# Patient Record
Sex: Male | Born: 1945
Health system: Southern US, Community
[De-identification: ages and names within clinical notes are randomized; demographics above are authoritative.]

## PROBLEM LIST (undated history)

## (undated) DIAGNOSIS — J189 Pneumonia, unspecified organism: Secondary | ICD-10-CM

## (undated) DIAGNOSIS — IMO0002 Reserved for concepts with insufficient information to code with codable children: Secondary | ICD-10-CM

## (undated) DIAGNOSIS — D696 Thrombocytopenia, unspecified: Secondary | ICD-10-CM

## (undated) DIAGNOSIS — N189 Chronic kidney disease, unspecified: Secondary | ICD-10-CM

## (undated) DIAGNOSIS — K219 Gastro-esophageal reflux disease without esophagitis: Secondary | ICD-10-CM

## (undated) DIAGNOSIS — K8020 Calculus of gallbladder without cholecystitis without obstruction: Secondary | ICD-10-CM

## (undated) DIAGNOSIS — Z992 Dependence on renal dialysis: Secondary | ICD-10-CM

## (undated) DIAGNOSIS — D649 Anemia, unspecified: Secondary | ICD-10-CM

## (undated) DIAGNOSIS — C9000 Multiple myeloma not having achieved remission: Secondary | ICD-10-CM

## (undated) DIAGNOSIS — M1990 Unspecified osteoarthritis, unspecified site: Secondary | ICD-10-CM

## (undated) DIAGNOSIS — R945 Abnormal results of liver function studies: Secondary | ICD-10-CM

## (undated) DIAGNOSIS — I959 Hypotension, unspecified: Secondary | ICD-10-CM

## (undated) HISTORY — DX: Calculus of gallbladder without cholecystitis without obstruction: K80.20

## (undated) HISTORY — DX: Multiple myeloma not having achieved remission: C90.00

## (undated) HISTORY — PX: MOUTH SURGERY: SHX715

## (undated) HISTORY — PX: RENAL BIOPSY: SHX156

## (undated) HISTORY — DX: Dependence on renal dialysis: Z99.2

---

## 2016-05-09 ENCOUNTER — Inpatient Hospital Stay (HOSPITAL_COMMUNITY)
Admission: EM | Admit: 2016-05-09 | Discharge: 2016-05-29 | DRG: 823 | Disposition: A | Discharge status: Rehabilitation Facility | Payer: PPO | Attending: Internal Medicine | Admitting: Internal Medicine

## 2016-05-09 ENCOUNTER — Encounter (HOSPITAL_COMMUNITY): Payer: Self-pay | Admitting: Emergency Medicine

## 2016-05-09 DIAGNOSIS — D619 Aplastic anemia, unspecified: Secondary | ICD-10-CM | POA: Diagnosis present

## 2016-05-09 DIAGNOSIS — I9581 Postprocedural hypotension: Secondary | ICD-10-CM | POA: Diagnosis not present

## 2016-05-09 DIAGNOSIS — N2581 Secondary hyperparathyroidism of renal origin: Secondary | ICD-10-CM | POA: Diagnosis present

## 2016-05-09 DIAGNOSIS — M545 Low back pain, unspecified: Secondary | ICD-10-CM | POA: Diagnosis present

## 2016-05-09 DIAGNOSIS — Z419 Encounter for procedure for purposes other than remedying health state, unspecified: Secondary | ICD-10-CM | POA: Diagnosis present

## 2016-05-09 DIAGNOSIS — E875 Hyperkalemia: Secondary | ICD-10-CM | POA: Diagnosis present

## 2016-05-09 DIAGNOSIS — I10 Essential (primary) hypertension: Secondary | ICD-10-CM | POA: Diagnosis present

## 2016-05-09 DIAGNOSIS — E538 Deficiency of other specified B group vitamins: Secondary | ICD-10-CM | POA: Diagnosis present

## 2016-05-09 DIAGNOSIS — E876 Hypokalemia: Secondary | ICD-10-CM | POA: Diagnosis not present

## 2016-05-09 DIAGNOSIS — R195 Other fecal abnormalities: Secondary | ICD-10-CM | POA: Diagnosis present

## 2016-05-09 DIAGNOSIS — N179 Acute kidney failure, unspecified: Secondary | ICD-10-CM | POA: Diagnosis present

## 2016-05-09 DIAGNOSIS — C9000 Multiple myeloma not having achieved remission: Principal | ICD-10-CM | POA: Diagnosis present

## 2016-05-09 DIAGNOSIS — I770 Arteriovenous fistula, acquired: Secondary | ICD-10-CM

## 2016-05-09 DIAGNOSIS — Z87891 Personal history of nicotine dependence: Secondary | ICD-10-CM

## 2016-05-09 DIAGNOSIS — N186 End stage renal disease: Secondary | ICD-10-CM | POA: Diagnosis present

## 2016-05-09 DIAGNOSIS — F22 Delusional disorders: Secondary | ICD-10-CM

## 2016-05-09 DIAGNOSIS — R509 Fever, unspecified: Secondary | ICD-10-CM

## 2016-05-09 DIAGNOSIS — Z6821 Body mass index (BMI) 21.0-21.9, adult: Secondary | ICD-10-CM

## 2016-05-09 DIAGNOSIS — D62 Acute posthemorrhagic anemia: Secondary | ICD-10-CM | POA: Diagnosis present

## 2016-05-09 DIAGNOSIS — E559 Vitamin D deficiency, unspecified: Secondary | ICD-10-CM | POA: Diagnosis present

## 2016-05-09 DIAGNOSIS — R21 Rash and other nonspecific skin eruption: Secondary | ICD-10-CM | POA: Diagnosis not present

## 2016-05-09 DIAGNOSIS — T451X5A Adverse effect of antineoplastic and immunosuppressive drugs, initial encounter: Secondary | ICD-10-CM | POA: Diagnosis present

## 2016-05-09 DIAGNOSIS — J810 Acute pulmonary edema: Secondary | ICD-10-CM | POA: Diagnosis not present

## 2016-05-09 DIAGNOSIS — G8918 Other acute postprocedural pain: Secondary | ICD-10-CM | POA: Diagnosis present

## 2016-05-09 DIAGNOSIS — E860 Dehydration: Secondary | ICD-10-CM | POA: Diagnosis present

## 2016-05-09 DIAGNOSIS — I132 Hypertensive heart and chronic kidney disease with heart failure and with stage 5 chronic kidney disease, or end stage renal disease: Secondary | ICD-10-CM | POA: Diagnosis present

## 2016-05-09 DIAGNOSIS — N2589 Other disorders resulting from impaired renal tubular function: Secondary | ICD-10-CM | POA: Diagnosis present

## 2016-05-09 DIAGNOSIS — T148XXA Other injury of unspecified body region, initial encounter: Secondary | ICD-10-CM

## 2016-05-09 DIAGNOSIS — Z992 Dependence on renal dialysis: Secondary | ICD-10-CM | POA: Diagnosis present

## 2016-05-09 DIAGNOSIS — I509 Heart failure, unspecified: Secondary | ICD-10-CM

## 2016-05-09 DIAGNOSIS — N19 Unspecified kidney failure: Secondary | ICD-10-CM

## 2016-05-09 DIAGNOSIS — F419 Anxiety disorder, unspecified: Secondary | ICD-10-CM | POA: Diagnosis present

## 2016-05-09 DIAGNOSIS — M84552A Pathological fracture in neoplastic disease, left femur, initial encounter for fracture: Secondary | ICD-10-CM | POA: Diagnosis present

## 2016-05-09 DIAGNOSIS — R05 Cough: Secondary | ICD-10-CM | POA: Diagnosis not present

## 2016-05-09 DIAGNOSIS — R059 Cough, unspecified: Secondary | ICD-10-CM

## 2016-05-09 DIAGNOSIS — Z880 Allergy status to penicillin: Secondary | ICD-10-CM

## 2016-05-09 DIAGNOSIS — K5909 Other constipation: Secondary | ICD-10-CM

## 2016-05-09 DIAGNOSIS — N08 Glomerular disorders in diseases classified elsewhere: Secondary | ICD-10-CM | POA: Insufficient documentation

## 2016-05-09 DIAGNOSIS — E440 Moderate protein-calorie malnutrition: Secondary | ICD-10-CM | POA: Diagnosis present

## 2016-05-09 DIAGNOSIS — D61810 Antineoplastic chemotherapy induced pancytopenia: Secondary | ICD-10-CM | POA: Diagnosis present

## 2016-05-09 DIAGNOSIS — K5900 Constipation, unspecified: Secondary | ICD-10-CM | POA: Diagnosis not present

## 2016-05-09 DIAGNOSIS — N390 Urinary tract infection, site not specified: Secondary | ICD-10-CM | POA: Diagnosis present

## 2016-05-09 HISTORY — DX: Reserved for concepts with insufficient information to code with codable children: IMO0002

## 2016-05-09 LAB — CBC WITH DIFFERENTIAL/PLATELET
Basophils Absolute: 0 10*3/uL (ref 0.0–0.1)
Basophils Relative: 0 %
EOS PCT: 1 %
Eosinophils Absolute: 0.1 10*3/uL (ref 0.0–0.7)
HEMATOCRIT: 26.5 % — AB (ref 39.0–52.0)
Hemoglobin: 9.2 g/dL — ABNORMAL LOW (ref 13.0–17.0)
LYMPHS ABS: 1.1 10*3/uL (ref 0.7–4.0)
Lymphocytes Relative: 17 %
MCH: 29.8 pg (ref 26.0–34.0)
MCHC: 34.7 g/dL (ref 30.0–36.0)
MCV: 85.8 fL (ref 78.0–100.0)
MONO ABS: 0.6 10*3/uL (ref 0.1–1.0)
MONOS PCT: 10 %
NEUTROS ABS: 4.4 10*3/uL (ref 1.7–7.7)
Neutrophils Relative %: 72 %
PLATELETS: 105 10*3/uL — AB (ref 150–400)
RBC: 3.09 MIL/uL — ABNORMAL LOW (ref 4.22–5.81)
RDW: 12.3 % (ref 11.5–15.5)
WBC: 6.2 10*3/uL (ref 4.0–10.5)

## 2016-05-09 NOTE — ED Notes (Signed)
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

## 2016-05-10 ENCOUNTER — Encounter (HOSPITAL_COMMUNITY): Payer: Self-pay | Admitting: *Deleted

## 2016-05-10 ENCOUNTER — Emergency Department: Payer: PPO

## 2016-05-10 ENCOUNTER — Inpatient Hospital Stay (HOSPITAL_COMMUNITY): Payer: PPO

## 2016-05-10 ENCOUNTER — Emergency Department (HOSPITAL_COMMUNITY): Payer: PPO

## 2016-05-10 DIAGNOSIS — R195 Other fecal abnormalities: Secondary | ICD-10-CM | POA: Diagnosis not present

## 2016-05-10 DIAGNOSIS — N2589 Other disorders resulting from impaired renal tubular function: Secondary | ICD-10-CM | POA: Diagnosis present

## 2016-05-10 DIAGNOSIS — C888 Other malignant immunoproliferative diseases: Secondary | ICD-10-CM | POA: Diagnosis not present

## 2016-05-10 DIAGNOSIS — N178 Other acute kidney failure: Secondary | ICD-10-CM | POA: Diagnosis not present

## 2016-05-10 DIAGNOSIS — Z992 Dependence on renal dialysis: Secondary | ICD-10-CM | POA: Diagnosis not present

## 2016-05-10 DIAGNOSIS — M899 Disorder of bone, unspecified: Secondary | ICD-10-CM | POA: Diagnosis not present

## 2016-05-10 DIAGNOSIS — S72302S Unspecified fracture of shaft of left femur, sequela: Secondary | ICD-10-CM | POA: Diagnosis not present

## 2016-05-10 DIAGNOSIS — N184 Chronic kidney disease, stage 4 (severe): Secondary | ICD-10-CM | POA: Diagnosis not present

## 2016-05-10 DIAGNOSIS — E876 Hypokalemia: Secondary | ICD-10-CM | POA: Diagnosis not present

## 2016-05-10 DIAGNOSIS — Z880 Allergy status to penicillin: Secondary | ICD-10-CM | POA: Diagnosis not present

## 2016-05-10 DIAGNOSIS — S72302D Unspecified fracture of shaft of left femur, subsequent encounter for closed fracture with routine healing: Secondary | ICD-10-CM | POA: Diagnosis not present

## 2016-05-10 DIAGNOSIS — F419 Anxiety disorder, unspecified: Secondary | ICD-10-CM | POA: Diagnosis present

## 2016-05-10 DIAGNOSIS — I132 Hypertensive heart and chronic kidney disease with heart failure and with stage 5 chronic kidney disease, or end stage renal disease: Secondary | ICD-10-CM | POA: Diagnosis present

## 2016-05-10 DIAGNOSIS — K5900 Constipation, unspecified: Secondary | ICD-10-CM | POA: Diagnosis not present

## 2016-05-10 DIAGNOSIS — Z6821 Body mass index (BMI) 21.0-21.9, adult: Secondary | ICD-10-CM | POA: Diagnosis not present

## 2016-05-10 DIAGNOSIS — N179 Acute kidney failure, unspecified: Secondary | ICD-10-CM | POA: Diagnosis present

## 2016-05-10 DIAGNOSIS — R05 Cough: Secondary | ICD-10-CM | POA: Diagnosis not present

## 2016-05-10 DIAGNOSIS — N2581 Secondary hyperparathyroidism of renal origin: Secondary | ICD-10-CM | POA: Diagnosis present

## 2016-05-10 DIAGNOSIS — E559 Vitamin D deficiency, unspecified: Secondary | ICD-10-CM | POA: Diagnosis present

## 2016-05-10 DIAGNOSIS — D638 Anemia in other chronic diseases classified elsewhere: Secondary | ICD-10-CM | POA: Diagnosis not present

## 2016-05-10 DIAGNOSIS — N390 Urinary tract infection, site not specified: Secondary | ICD-10-CM | POA: Diagnosis present

## 2016-05-10 DIAGNOSIS — D696 Thrombocytopenia, unspecified: Secondary | ICD-10-CM | POA: Diagnosis not present

## 2016-05-10 DIAGNOSIS — J810 Acute pulmonary edema: Secondary | ICD-10-CM | POA: Diagnosis not present

## 2016-05-10 DIAGNOSIS — D892 Hypergammaglobulinemia, unspecified: Secondary | ICD-10-CM | POA: Diagnosis not present

## 2016-05-10 DIAGNOSIS — N186 End stage renal disease: Secondary | ICD-10-CM | POA: Diagnosis present

## 2016-05-10 DIAGNOSIS — M549 Dorsalgia, unspecified: Secondary | ICD-10-CM | POA: Diagnosis not present

## 2016-05-10 DIAGNOSIS — Z510 Encounter for antineoplastic radiation therapy: Secondary | ICD-10-CM | POA: Diagnosis present

## 2016-05-10 DIAGNOSIS — Z87891 Personal history of nicotine dependence: Secondary | ICD-10-CM | POA: Diagnosis not present

## 2016-05-10 DIAGNOSIS — D61810 Antineoplastic chemotherapy induced pancytopenia: Secondary | ICD-10-CM | POA: Diagnosis present

## 2016-05-10 DIAGNOSIS — E440 Moderate protein-calorie malnutrition: Secondary | ICD-10-CM | POA: Diagnosis present

## 2016-05-10 DIAGNOSIS — D62 Acute posthemorrhagic anemia: Secondary | ICD-10-CM | POA: Diagnosis not present

## 2016-05-10 DIAGNOSIS — I770 Arteriovenous fistula, acquired: Secondary | ICD-10-CM | POA: Diagnosis not present

## 2016-05-10 DIAGNOSIS — D61818 Other pancytopenia: Secondary | ICD-10-CM | POA: Diagnosis not present

## 2016-05-10 DIAGNOSIS — R5381 Other malaise: Secondary | ICD-10-CM | POA: Diagnosis not present

## 2016-05-10 DIAGNOSIS — C9000 Multiple myeloma not having achieved remission: Secondary | ICD-10-CM | POA: Diagnosis present

## 2016-05-10 DIAGNOSIS — N189 Chronic kidney disease, unspecified: Secondary | ICD-10-CM | POA: Diagnosis not present

## 2016-05-10 DIAGNOSIS — I9581 Postprocedural hypotension: Secondary | ICD-10-CM | POA: Diagnosis not present

## 2016-05-10 DIAGNOSIS — D519 Vitamin B12 deficiency anemia, unspecified: Secondary | ICD-10-CM | POA: Diagnosis not present

## 2016-05-10 DIAGNOSIS — D619 Aplastic anemia, unspecified: Secondary | ICD-10-CM | POA: Diagnosis not present

## 2016-05-10 DIAGNOSIS — E875 Hyperkalemia: Secondary | ICD-10-CM | POA: Diagnosis present

## 2016-05-10 DIAGNOSIS — E538 Deficiency of other specified B group vitamins: Secondary | ICD-10-CM | POA: Diagnosis present

## 2016-05-10 DIAGNOSIS — I509 Heart failure, unspecified: Secondary | ICD-10-CM | POA: Diagnosis not present

## 2016-05-10 DIAGNOSIS — R21 Rash and other nonspecific skin eruption: Secondary | ICD-10-CM | POA: Diagnosis not present

## 2016-05-10 DIAGNOSIS — M84552A Pathological fracture in neoplastic disease, left femur, initial encounter for fracture: Secondary | ICD-10-CM | POA: Diagnosis not present

## 2016-05-10 DIAGNOSIS — E860 Dehydration: Secondary | ICD-10-CM | POA: Diagnosis present

## 2016-05-10 DIAGNOSIS — D6189 Other specified aplastic anemias and other bone marrow failure syndromes: Secondary | ICD-10-CM | POA: Diagnosis not present

## 2016-05-10 DIAGNOSIS — N19 Unspecified kidney failure: Secondary | ICD-10-CM | POA: Diagnosis not present

## 2016-05-10 DIAGNOSIS — M545 Low back pain: Secondary | ICD-10-CM | POA: Diagnosis not present

## 2016-05-10 DIAGNOSIS — F4323 Adjustment disorder with mixed anxiety and depressed mood: Secondary | ICD-10-CM | POA: Diagnosis not present

## 2016-05-10 DIAGNOSIS — M84552D Pathological fracture in neoplastic disease, left femur, subsequent encounter for fracture with routine healing: Secondary | ICD-10-CM | POA: Diagnosis not present

## 2016-05-10 LAB — URINALYSIS W MICROSCOPIC (NOT AT ARMC)
BILIRUBIN URINE: NEGATIVE
GLUCOSE, UA: 100 mg/dL — AB
KETONES UR: NEGATIVE mg/dL
Nitrite: NEGATIVE
PROTEIN: 100 mg/dL — AB
Specific Gravity, Urine: 1.017 (ref 1.005–1.030)
pH: 6 (ref 5.0–8.0)

## 2016-05-10 LAB — HEPATIC FUNCTION PANEL
ALBUMIN: 2.8 g/dL — AB (ref 3.5–5.0)
ALT: 9 U/L — AB (ref 17–63)
AST: 13 U/L — AB (ref 15–41)
Alkaline Phosphatase: 72 U/L (ref 38–126)
BILIRUBIN DIRECT: 0.1 mg/dL (ref 0.1–0.5)
Indirect Bilirubin: 0.3 mg/dL (ref 0.3–0.9)
Total Bilirubin: 0.4 mg/dL (ref 0.3–1.2)
Total Protein: 9.7 g/dL — ABNORMAL HIGH (ref 6.5–8.1)

## 2016-05-10 LAB — BASIC METABOLIC PANEL
ANION GAP: 13 (ref 5–15)
BUN: 43 mg/dL — ABNORMAL HIGH (ref 6–20)
CO2: 18 mmol/L — AB (ref 22–32)
Calcium: 8.7 mg/dL — ABNORMAL LOW (ref 8.9–10.3)
Chloride: 102 mmol/L (ref 101–111)
Creatinine, Ser: 10.68 mg/dL — ABNORMAL HIGH (ref 0.61–1.24)
GFR, EST AFRICAN AMERICAN: 5 mL/min — AB (ref >60)
GFR, EST NON AFRICAN AMERICAN: 4 mL/min — AB (ref >60)
GLUCOSE: 120 mg/dL — AB (ref 65–99)
POTASSIUM: 5.2 mmol/L — AB (ref 3.5–5.1)
Sodium: 133 mmol/L — ABNORMAL LOW (ref 135–145)

## 2016-05-10 LAB — IRON AND TIBC
Iron: 64 ug/dL (ref 45–182)
SATURATION RATIOS: 38 % (ref 17.9–39.5)
TIBC: 167 ug/dL — ABNORMAL LOW (ref 250–450)
UIBC: 103 ug/dL

## 2016-05-10 LAB — PLATELET FUNCTION ASSAY
Collagen / ADP: 136 seconds — ABNORMAL HIGH (ref 0–118)
Collagen / Epinephrine: >275 seconds — ABNORMAL HIGH (ref 0–193)

## 2016-05-10 LAB — I-STAT CHEM 8, ED
BUN: 61 mg/dL — AB (ref 6–20)
CALCIUM ION: 1.05 mmol/L — AB (ref 1.13–1.30)
CHLORIDE: 106 mmol/L (ref 101–111)
Creatinine, Ser: 10.5 mg/dL — ABNORMAL HIGH (ref 0.61–1.24)
GLUCOSE: 157 mg/dL — AB (ref 65–99)
HCT: 28 % — ABNORMAL LOW (ref 39.0–52.0)
Hemoglobin: 9.5 g/dL — ABNORMAL LOW (ref 13.0–17.0)
Potassium: 6.7 mmol/L (ref 3.5–5.1)
Sodium: 135 mmol/L (ref 135–145)
TCO2: 22 mmol/L (ref 0–100)

## 2016-05-10 LAB — NA AND K (SODIUM & POTASSIUM), RAND UR
POTASSIUM UR: 30 mmol/L
SODIUM UR: 74 mmol/L

## 2016-05-10 LAB — SALICYLATE LEVEL: Salicylate Lvl: <4 mg/dL (ref 2.8–30.0)

## 2016-05-10 LAB — VITAMIN B12: Vitamin B-12: 106 pg/mL — ABNORMAL LOW (ref 180–914)

## 2016-05-10 LAB — POC OCCULT BLOOD, ED: Fecal Occult Bld: POSITIVE — AB

## 2016-05-10 LAB — ACETAMINOPHEN LEVEL: Acetaminophen (Tylenol), Serum: <10 ug/mL — ABNORMAL LOW (ref 10–30)

## 2016-05-10 LAB — PROTIME-INR
INR: 1.4 (ref 0.00–1.49)
Prothrombin Time: 17.3 seconds — ABNORMAL HIGH (ref 11.6–15.2)

## 2016-05-10 LAB — CREATININE, URINE, RANDOM: CREATININE, URINE: 114.42 mg/dL

## 2016-05-10 LAB — MAGNESIUM: MAGNESIUM: 1.9 mg/dL (ref 1.7–2.4)

## 2016-05-10 LAB — APTT: APTT: 33 s (ref 24–37)

## 2016-05-10 LAB — URIC ACID: URIC ACID, SERUM: 10.6 mg/dL — AB (ref 4.4–7.6)

## 2016-05-10 MED ORDER — VITAMIN K1 10 MG/ML IJ SOLN
5.0000 mg | Freq: Once | INTRAMUSCULAR | Status: AC
  Administered 2016-05-10: 5 mg via SUBCUTANEOUS
  Filled 2016-05-10: qty 0.5

## 2016-05-10 MED ORDER — ONDANSETRON HCL 4 MG/2ML IJ SOLN: 4.0000 mg | Freq: Four times a day (QID) | INTRAMUSCULAR | Status: DC | PRN

## 2016-05-10 MED ORDER — NEPRO/CARBSTEADY PO LIQD
237.0000 mL | Freq: Two times a day (BID) | ORAL | Status: DC
Start: 2016-05-10 — End: 2016-05-26
  Administered 2016-05-10 – 2016-05-24 (×10): 237 mL via ORAL

## 2016-05-10 MED ORDER — HEPARIN SODIUM (PORCINE) 5000 UNIT/ML IJ SOLN: 5000.0000 [IU] | Freq: Three times a day (TID) | INTRAMUSCULAR | Status: DC

## 2016-05-10 MED ORDER — VITAMIN B-12 1000 MCG PO TABS
1000.0000 ug | ORAL_TABLET | Freq: Every day | ORAL | Status: DC
  Administered 2016-05-10 – 2016-05-29 (×18): 1000 ug via ORAL
  Filled 2016-05-10 (×20): qty 1

## 2016-05-10 MED ORDER — SODIUM CHLORIDE 0.9 % IV BOLUS (SEPSIS)
1000.0000 mL | Freq: Once | INTRAVENOUS | Status: AC
  Administered 2016-05-10: 1000 mL via INTRAVENOUS

## 2016-05-10 NOTE — ED Provider Notes (Signed)
CSN: [id number]     Arrival date & time 05/09/16  2057 History   First MD Initiated Contact with Patient 05/10/16 0302     Chief Complaint  Patient presents with  . low hgb      (Consider location/radiation/quality/duration/timing/severity/associated sxs/prior Treatment) HPI Comments: Patient states that he and family ate out Sunday and all have had nausea and vomiting since.  He went to an urgent care today due to the nausea.  They did blood work which showed that he had an H&H of 9.8 and 29.3 and was advised to come to the emergency department.  He reports that he's had persistent vomiting in the morning for the past several months, worse over the last 2 days clear in nature.  He noticed that his skin has become excessively dry.   The history is provided by the patient.    Past Medical History  Diagnosis Date  . Compression fracture    Past Surgical History  Procedure Laterality Date  . Mouth surgery     Family History  Problem Relation Age of Onset  . Diabetes Father    Social History  Substance Use Topics  . Smoking status: Former Research scientist (life sciences)  . Smokeless tobacco: None  . Alcohol Use: No    Review of Systems  Constitutional: Negative for fever and chills.  Respiratory: Negative for shortness of breath.   Cardiovascular: Negative for chest pain.  Gastrointestinal: Positive for vomiting. Negative for abdominal pain and abdominal distention.  Musculoskeletal: Positive for back pain.  All other systems reviewed and are negative.     Allergies  Penicillins  Home Medications   Prior to Admission medications   Medication Sig Start Date End Date Taking? Authorizing Provider  ibuprofen (ADVIL,MOTRIN) 200 MG tablet Take 200 mg by mouth every 6 (six) hours as needed for moderate pain.    Yes Historical Provider, MD   BP 152/86 mmHg  Pulse 94  Temp(Src) 98.9 F (37.2 C) (Oral)  Resp 16  Ht 6\' 1"  (1.854 m)  Wt 72.122 kg  BMI 20.98 kg/m2  SpO2 99% Physical Exam   Constitutional: He is oriented to person, place, and time. He appears well-developed. No distress.  HENT:  Head: Normocephalic and atraumatic.  Mouth/Throat: Oropharynx is clear and moist.  Eyes: Pupils are equal, round, and reactive to light.  Neck: Normal range of motion.  Cardiovascular: Normal rate and regular rhythm.   Pulmonary/Chest: Effort normal and breath sounds normal.  Abdominal: Soft. He exhibits no distension. There is no tenderness.  Musculoskeletal: He exhibits no edema or tenderness.  Neurological: He is alert and oriented to person, place, and time.  Skin: Skin is dry. There is pallor.  Nursing note and vitals reviewed.   ED Course  Procedures (including critical care time) Labs Review Labs Reviewed  CBC WITH DIFFERENTIAL/PLATELET - Abnormal; Notable for the following:    RBC 3.09 (*)    Hemoglobin 9.2 (*)    HCT 26.5 (*)    Platelets 105 (*)    All other components within normal limits  BASIC METABOLIC PANEL - Abnormal; Notable for the following:    Sodium 133 (*)    Potassium 5.2 (*)    CO2 18 (*)    Glucose, Bld 120 (*)    BUN 43 (*)    Creatinine, Ser 10.68 (*)    Calcium 8.7 (*)    GFR calc non Af Amer 4 (*)    GFR calc Af Amer 5 (*)    All  other components within normal limits  I-STAT CHEM 8, ED - Abnormal; Notable for the following:    Potassium 6.7 (*)    BUN 61 (*)    Creatinine, Ser 10.50 (*)    Glucose, Bld 157 (*)    Calcium, Ion 1.05 (*)    Hemoglobin 9.5 (*)    HCT 28.0 (*)    All other components within normal limits  POC OCCULT BLOOD, ED - Abnormal; Notable for the following:    Fecal Occult Bld POSITIVE (*)    All other components within normal limits  MAGNESIUM  HEPATIC FUNCTION PANEL  SALICYLATE LEVEL  ACETAMINOPHEN LEVEL  PROTEIN ELECTROPHORESIS, SERUM  IFE/LIGHT CHAINS/TP QN, 24-HR UR  KAPPA/LAMBDA LIGHT CHAINS    Imaging Review No results found. I have personally reviewed and evaluated these images and lab results as  part of my medical decision-making.   EKG Interpretation None    When I further investigated causes for the slight anemia i-STAT was added which showed that he has a potassium of 6.7 and a creatinine of 10.5 and a BUN of 61, which is new for this patient.  I have no labs to compare this with.  As the patient has not seen a physician in years.  He also is guaiac positive from below Spoke with Dr, Justin Mend who will see patient in AM MDM   Final diagnoses:  Acute renal failure, unspecified acute renal failure type ([place])         Junius Creamer, NP 05/10/16 0432  April Palumbo, MD 05/10/16 [phone]

## 2016-05-10 NOTE — ED Notes (Signed)
[facility] arrived  

## 2016-05-10 NOTE — Consult Note (Signed)
Reason for Consult:AKI/CKD Referring Physician: Dr. Cherlyn Thomas [name] is an 70 y.o. male.  HPI: 70 yr male with almost no med F/u presented to outpatient care and found to have signif anemia so sent to ED.  Found to have Cr of 10, K 6.7(now 5/2), and bicarb of 18.  In 3/16 developed sudden onset severe back pain. Saw a chiropractor, given Narcotics and got constip,susequently told to take Alleve 5 q 4 h.  Ongoing pain,saw 2nd chiropractor, dx with Comp fx, , took Pred with constip and little relief.  Referred to Dr. Lynann [name] and conservative tx cont, with Alleve, Ibuprofen, rest.  Still has back and Thomas [name] leg pain.  Last wk, am N, V attrib to sinus.  Over weekend, more N,V, and loose stool which was attrib to food poisoning.   No edema, nocturia,hematuria, rash, other arthritis. Cough thought to be sinus.  No hemotysis.  No FH renal dz or inherited eye, hearing or ms skel defects.  He has no hx UTIs, stones , no voiding sx.  No other meds, no HTN. Constitutional: minor as above, limited by back in activity Eyes: glasses,no teeth Ears, nose, mouth, throat, and face: sinus with drainage Respiratory: some cough, little sputum, quit smoking 25 yr ago Cardiovascular: negative Gastrointestinal: an N,V Genitourinary:negative Integument/breast: negative Hematologic/lymphatic: anemic Musculoskeletal:just back and Thomas [name] leg Behavioral/Psych: anxiety Endocrine: neg Allergic/Immunologic: PCN, Codeine     Past Medical History  Diagnosis Date  . Compression fracture     Past Surgical History  Procedure Laterality Date  . Mouth surgery      Family History  Problem Relation Age of Onset  . Diabetes Father     Social History:  reports that he has quit smoking. He does not have any smokeless tobacco history on file. He reports that he does not drink alcohol or use illicit drugs.  Allergies:  Allergies  Allergen Reactions  . Codeine Other (See Comments)    "MAKES ME JUMPY"  . Penicillins      Has patient had a PCN reaction causing immediate rash, facial/tongue/throat swelling, SOB or lightheadedness with hypotension: Yes Has patient had a PCN reaction causing severe rash involving mucus membranes or skin necrosis: Yes Has patient had a PCN reaction that required hospitalization: No Has patient had a PCN reaction occurring within the last 10 years: No If all of the above answers are "NO", then may proceed with Cephalosporin use.     Medications:  I have reviewed the patient's current medications. Prior to Admission:  No prescriptions prior to admission     Results for orders placed or performed during the hospital encounter of 05/09/16 (from the past 48 hour(s))  CBC with Differential     Status: Abnormal   Collection Time: 05/09/16  9:44 PM  Result Value Ref Range   WBC 6.2 4.0 - 10.5 K/uL   RBC 3.09 (Thomas [name]) 4.22 - 5.81 MIL/uL   Hemoglobin 9.2 (Thomas [name]) 13.0 - 17.0 g/dL   HCT 26.5 (Thomas [name]) 39.0 - 52.0 %   MCV 85.8 78.0 - 100.0 fL   MCH 29.8 26.0 - 34.0 pg   MCHC 34.7 30.0 - 36.0 g/dL   RDW 12.3 11.5 - 15.5 %   Platelets 105 (Thomas [name]) 150 - 400 K/uL    Comment: SPECIMEN CHECKED FOR CLOTS REPEATED TO VERIFY PLATELET COUNT CONFIRMED BY SMEAR    Neutrophils Relative % 72 %   Lymphocytes Relative 17 %   Monocytes Relative 10 %   Eosinophils Relative 1 %  Basophils Relative 0 %   Neutro Abs 4.4 1.7 - 7.7 K/uL   Lymphs Abs 1.1 0.7 - 4.0 K/uL   Monocytes Absolute 0.6 0.1 - 1.0 K/uL   Eosinophils Absolute 0.1 0.0 - 0.7 K/uL   Basophils Absolute 0.0 0.0 - 0.1 K/uL   Smear Review MORPHOLOGY UNREMARKABLE   I-stat chem 8, ed     Status: Abnormal   Collection Time: 05/10/16  2:47 AM  Result Value Ref Range   Sodium 135 135 - 145 mmol/Thomas [name]   Potassium 6.7 (HH) 3.5 - 5.1 mmol/Thomas [name]   Chloride 106 101 - 111 mmol/Thomas [name]   BUN 61 (H) 6 - 20 mg/dL   Creatinine, Ser 10.50 (H) 0.61 - 1.24 mg/dL   Glucose, Bld 157 (H) 65 - 99 mg/dL   Calcium, Ion 1.05 (Thomas [name]) 1.13 - 1.30 mmol/Thomas [name]   TCO2 22 0 - 100 mmol/Thomas [name]    Hemoglobin 9.5 (Thomas [name]) 13.0 - 17.0 g/dL   HCT 28.0 (Thomas [name]) 39.0 - 52.0 %   Comment NOTIFIED PHYSICIAN   POC occult blood, ED RN will collect     Status: Abnormal   Collection Time: 05/10/16  2:52 AM  Result Value Ref Range   Fecal Occult Bld POSITIVE (A) NEGATIVE  Magnesium     Status: None   Collection Time: 05/10/16  3:10 AM  Result Value Ref Range   Magnesium 1.9 1.7 - 2.4 mg/dL  Hepatic function panel     Status: Abnormal   Collection Time: 05/10/16  3:10 AM  Result Value Ref Range   Total Protein 9.7 (H) 6.5 - 8.1 g/dL   Albumin 2.8 (Thomas [name]) 3.5 - 5.0 g/dL   AST 13 (Thomas [name]) 15 - 41 U/Thomas [name]   ALT 9 (Thomas [name]) 17 - 63 U/Thomas [name]   Alkaline Phosphatase 72 38 - 126 U/Thomas [name]   Total Bilirubin 0.4 0.3 - 1.2 mg/dL   Bilirubin, Direct 0.1 0.1 - 0.5 mg/dL   Indirect Bilirubin 0.3 0.3 - 0.9 mg/dL  Basic metabolic panel     Status: Abnormal   Collection Time: 05/10/16  3:18 AM  Result Value Ref Range   Sodium 133 (Thomas [name]) 135 - 145 mmol/Thomas [name]   Potassium 5.2 (H) 3.5 - 5.1 mmol/Thomas [name]    Comment: RESULT REPEATED AND VERIFIED DELTA CHECK NOTED NO VISIBLE HEMOLYSIS    Chloride 102 101 - 111 mmol/Thomas [name]   CO2 18 (Thomas [name]) 22 - 32 mmol/Thomas [name]   Glucose, Bld 120 (H) 65 - 99 mg/dL   BUN 43 (H) 6 - 20 mg/dL   Creatinine, Ser 10.68 (H) 0.61 - 1.24 mg/dL   Calcium 8.7 (Thomas [name]) 8.9 - 10.3 mg/dL   GFR calc non Af Amer 4 (Thomas [name]) >60 mL/min   GFR calc Af Amer 5 (Thomas [name]) >60 mL/min    Comment: (NOTE) The eGFR has been calculated using the CKD EPI equation. This calculation has not been validated in all clinical situations. eGFR's persistently <60 mL/min signify possible Chronic Kidney Disease.    Anion gap 13 5 - 15  Salicylate level     Status: None   Collection Time: 05/10/16  3:50 AM  Result Value Ref Range   Salicylate Lvl <1.6 2.8 - 30.0 mg/dL  Acetaminophen level     Status: Abnormal   Collection Time: 05/10/16  3:50 AM  Result Value Ref Range   Acetaminophen (Tylenol), Serum <10 (Thomas [name]) 10 - 30 ug/mL    Comment:        THERAPEUTIC CONCENTRATIONS  VARY SIGNIFICANTLY. A RANGE OF 10-30 ug/mL MAY BE AN  EFFECTIVE CONCENTRATION FOR MANY PATIENTS. HOWEVER, SOME ARE BEST TREATED AT CONCENTRATIONS OUTSIDE THIS RANGE. ACETAMINOPHEN CONCENTRATIONS >150 ug/mL AT 4 HOURS AFTER INGESTION AND >50 ug/mL AT 12 HOURS AFTER INGESTION ARE OFTEN ASSOCIATED WITH TOXIC REACTIONS.   Urinalysis with microscopic (not at [facility])     Status: Abnormal   Collection Time: 05/10/16  8:12 AM  Result Value Ref Range   Color, Urine YELLOW YELLOW   APPearance CLOUDY (A) CLEAR   Specific Gravity, Urine 1.017 1.005 - 1.030   pH 6.0 5.0 - 8.0   Glucose, UA 100 (A) NEGATIVE mg/dL   Hgb urine dipstick SMALL (A) NEGATIVE   Bilirubin Urine NEGATIVE NEGATIVE   Ketones, ur NEGATIVE NEGATIVE mg/dL   Protein, ur 100 (A) NEGATIVE mg/dL   Nitrite NEGATIVE NEGATIVE   Leukocytes, UA MODERATE (A) NEGATIVE   WBC, UA 6-30 0 - 5 WBC/hpf   RBC / HPF 0-5 0 - 5 RBC/hpf   Bacteria, UA RARE (A) NONE SEEN   Squamous Epithelial / LPF 6-30 (A) NONE SEEN   Casts HYALINE CASTS (A) NEGATIVE    Comment: GRANULAR CAST    US Renal  05/10/2016  CLINICAL DATA:  Acute kidney injury. EXAM: RENAL / URINARY TRACT ULTRASOUND COMPLETE COMPARISON:  None. FINDINGS: Right Kidney: Length: 11.4 cm. Echogenicity within normal limits. No mass or hydronephrosis visualized. Left Kidney: Length: 11.6 cm. Echogenicity within normal limits. There is a 2.3 x 2.1 x 2.1 cm cyst in the upper pole. No fall with mass or hydronephrosis visualized. Bladder: Appears normal for degree of bladder distention. IMPRESSION: 1. Left renal cyst.  Otherwise unremarkable renal ultrasound. 2. No obstructive uropathy. Electronically Signed   By: Jeb Levering M.D.   On: 05/10/2016 04:29    ROS Blood pressure 135/64, pulse 86, temperature 98.1 F (36.7 C), temperature source Oral, resp. rate 18, height _0  (1.854 m), weight 68 kg (149 lb 14.6 oz), SpO2 100 %. Physical Exam Physical Examination: General appearance - thin,  appears chronically ill Mental status - alert, oriented to person, place, and time Eyes - pupils equal and reactive, extraocular eye movements intact Mouth - edentulous Neck - adenopathy noted PCL Lymphatics - posterior cervical nodes Chest - ^  Resonance, occ rhonchi Heart - S1 and S2 normal, systolic murmur EM7/5 at apex Abdomen - soft, nontender, nondistended, no masses or organomegaly Extremities - peripheral pulses normal, no pedal edema, no clubbing or cyanosis Skin - pale, dry  Assessment/Plan: 1 AKI/CKD  Has normal sized kidneys, protein and WBC in urine.   Also ^ globulin gap.  Most c/w interstitial injury vs dysproteinemia.   With back injury and tx , could be either.  Not real c/w chronic dz.  Clearly uremic, acidemic and hyperkalemic.  Needs RRT and diagnostic studies.  Cannot r/o other immunologic cause.  Need to try to do bx rapidly. 2 low platelets 3 LBP need films 4. Anemia Renal vs other 5. Metabolic Bone Disease eval P serologies, bx, Pc, Hd, PTH, urine studies, get back films.  Urine eos.  Thomas Thomas [name] 05/10/2016, 12:08 PM

## 2016-05-10 NOTE — H&P (Signed)
History and Physical    Thomas [name] PXT:[id number] DOB: Mar 04, 1946 DOA: 05/09/2016   PCP: No PCP Per Patient Chief Complaint:  Chief Complaint  Patient presents with  . low hgb     HPI: Thomas [name] is a 70 y.o. male with medical history significant of not seeing a physician in many years.  Patient has been on very high dose NSAIDS recently due to development of back pain.  After his family member found out about this they cut down on the amount of NSAIDs he was taking.  However over the past 1 month or so he has had almost daily emesis in the morning.  Symptoms have been persistent and unchanging except for the past couple of days when they were worse due to an apparent episode of food poisoning which has resolved.  He states he is drinking a lot of water and keeping it down.  No diarrhea.  No melena, no BRBPR.  He went to an UC today due to persistent nausea.  They did blood work which showed a hemoglobin of 9.8 and sent him to the ED apparently.  ED Course: In the ED the patient is found to be in frank renal failure with creatinine >10!  His BUN is only 40, he states he hasnt had any ibuprofen or other NSAIDS in the past couple of days.  Further lab work up reveals what appears to be findings c/w a type 4 RTA, mild hyperkalemia, and finally a high total protein with a low albumin level.  Review of Systems: As per HPI otherwise 10 point review of systems negative.    Past Medical History  Diagnosis Date  . Compression fracture     Past Surgical History  Procedure Laterality Date  . Mouth surgery       reports that he has quit smoking. He does not have any smokeless tobacco history on file. He reports that he does not drink alcohol or use illicit drugs.  Allergies  Allergen Reactions  . Penicillins     Has patient had a PCN reaction causing immediate rash, facial/tongue/throat swelling, SOB or lightheadedness with hypotension: Yes Has patient had a PCN reaction causing severe  rash involving mucus membranes or skin necrosis: Yes Has patient had a PCN reaction that required hospitalization: No Has patient had a PCN reaction occurring within the last 10 years: No If all of the above answers are "NO", then may proceed with Cephalosporin use.     Family History  Problem Relation Age of Onset  . Diabetes Father      Prior to Admission medications   Not on File    Physical Exam: Filed Vitals:   05/09/16 2110 05/10/16 0149 05/10/16 0447  BP: 154/80 152/86 152/83  Pulse: 100 94 91  Temp: 98.9 F (37.2 C)    TempSrc: Oral    Resp: 18 16 16   Height: 6' 1"  (1.854 m)    Weight: 72.122 kg (159 lb)    SpO2: 100% 99% 100%      Constitutional: NAD, calm, comfortable Eyes: PERRL, lids and conjunctivae normal ENMT: Mucous membranes are moist. Posterior pharynx clear of any exudate or lesions.Normal dentition.  Neck: normal, supple, no masses, no thyromegaly Respiratory: clear to auscultation bilaterally, no wheezing, no crackles. Normal respiratory effort. No accessory muscle use.  Cardiovascular: Regular rate and rhythm, no murmurs / rubs / gallops. No extremity edema. 2+ pedal pulses. No carotid bruits.  Abdomen: no tenderness, no masses palpated. No hepatosplenomegaly. Bowel sounds positive.  Musculoskeletal: no clubbing / cyanosis. No joint deformity upper and lower extremities. Good ROM, no contractures. Normal muscle tone.  Skin: no rashes, lesions, ulcers. No induration Neurologic: CN 2-12 grossly intact. Sensation intact, DTR normal. Strength 5/5 in all 4.  Psychiatric: Normal judgment and insight. Alert and oriented x 3. Normal mood.    Labs on Admission: I have personally reviewed following labs and imaging studies  CBC:  Recent Labs Lab 05/09/16 2144 05/10/16 0247  WBC 6.2  --   NEUTROABS 4.4  --   HGB 9.2* 9.5*  HCT 26.5* 28.0*  MCV 85.8  --   PLT 105*  --    Basic Metabolic Panel:  Recent Labs Lab 05/10/16 0247 05/10/16 0310  05/10/16 0318  NA 135  --  133*  K 6.7*  --  5.2*  CL 106  --  102  CO2  --   --  18*  GLUCOSE 157*  --  120*  BUN 61*  --  43*  CREATININE 10.50*  --  10.68*  CALCIUM  --   --  8.7*  MG  --  1.9  --    GFR: Estimated Creatinine Clearance: 6.8 mL/min (by C-G formula based on Cr of 10.68). Liver Function Tests:  Recent Labs Lab 05/10/16 0310  AST 13*  ALT 9*  ALKPHOS 72  BILITOT 0.4  PROT 9.7*  ALBUMIN 2.8*   No results for input(s): LIPASE, AMYLASE in the last 168 hours. No results for input(s): AMMONIA in the last 168 hours. Coagulation Profile: No results for input(s): INR, PROTIME in the last 168 hours. Cardiac Enzymes: No results for input(s): CKTOTAL, CKMB, CKMBINDEX, TROPONINI in the last 168 hours. BNP (last 3 results) No results for input(s): PROBNP in the last 8760 hours. HbA1C: No results for input(s): HGBA1C in the last 72 hours. CBG: No results for input(s): GLUCAP in the last 168 hours. Lipid Profile: No results for input(s): CHOL, HDL, LDLCALC, TRIG, CHOLHDL, LDLDIRECT in the last 72 hours. Thyroid Function Tests: No results for input(s): TSH, T4TOTAL, FREET4, T3FREE, THYROIDAB in the last 72 hours. Anemia Panel: No results for input(s): VITAMINB12, FOLATE, FERRITIN, TIBC, IRON, RETICCTPCT in the last 72 hours. Urine analysis: No results found for: COLORURINE, APPEARANCEUR, LABSPEC, PHURINE, GLUCOSEU, HGBUR, BILIRUBINUR, KETONESUR, PROTEINUR, UROBILINOGEN, NITRITE, LEUKOCYTESUR Sepsis Labs: @LABRCNTIP (procalcitonin:4,lacticidven:4) )No results found for this or any previous visit (from the past 240 hour(s)).   Radiological Exams on Admission: US Renal  05/10/2016  CLINICAL DATA:  Acute kidney injury. EXAM: RENAL / URINARY TRACT ULTRASOUND COMPLETE COMPARISON:  None. FINDINGS: Right Kidney: Length: 11.4 cm. Echogenicity within normal limits. No mass or hydronephrosis visualized. Left Kidney: Length: 11.6 cm. Echogenicity within normal limits. There is  a 2.3 x 2.1 x 2.1 cm cyst in the upper pole. No fall with mass or hydronephrosis visualized. Bladder: Appears normal for degree of bladder distention. IMPRESSION: 1. Left renal cyst.  Otherwise unremarkable renal ultrasound. 2. No obstructive uropathy. Electronically Signed   By: Jeb Levering M.D.   On: 05/10/2016 04:29    EKG: Independently reviewed.  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 Justin Mend after hearing about the initial lab results felt that multiple myeloma was at the top of the differential, since the EDP talked with him, patient's total protein has come  back elevated and his albumin low (this would be very consistent with Dr. Jason Nest concern)  Serum and urine protein electrophoresis and IFA have been ordered, lambda and kappa light chains also ordered.  UA with microscopic (not reflex) evaluation ordered  US renal was unimpressive  Occult blood positive stool -  Stopping all NSAIDs   DVT prophylaxis: SCDs due to occult positive stool Code Status: Full Family Communication: Family at bedside Consults called: Nephrology, Dr. Justin Mend consulted and requested the MM labs Admission status: Admit to inpatient   Etta Quill DO [facility] Pager [phone] from 7PM-7AM  If 7AM-7PM, please contact the day physician for the patient [email or web address] Password [email or web address]  05/10/2016, 5:18 AM

## 2016-05-10 NOTE — Progress Notes (Signed)
New Admission Note:   Arrival Method: Via [facility] from [facility] ED Mental Orientation: Alert and oriented x4 Telemetry: Box #12 Assessment: Completed Skin: See doc flowsheet IV: LH-NSL Pain: Denies Tubes: N/A Safety Measures: Safety Fall Prevention Plan has been given, discussed and signed Admission: Completed [address] Orientation: Patient has been orientated to the room, unit and staff.  Family: Wife at bedside [facility] flow manager made aware that patient is here.  Orders have been reviewed and implemented. Will continue to monitor the patient. Call light has been placed within reach and bed alarm has been activated.   [employer], RN-BC Phone number: [phone]

## 2016-05-10 NOTE — Progress Notes (Signed)
PROGRESS NOTE                                                                                                                                                                                                             Patient Demographics:    Thomas [name], is a 70 y.o. male, DOB - Jul 26, 1946, [id number]  Admit date - 05/09/2016   Admitting Physician Etta Quill, DO  Outpatient Primary MD for the patient is No PCP Per Patient  LOS - 0  Outpatient Specialists: none  Chief Complaint  Patient presents with  . low hgb        Brief Narrative   70 year old male with no significant medical history (had not seen a physician in many years) presented with about 1 month of nausea with daily vomiting. Also reports poor by mouth intake and subjective weight loss. Denied any diarrhea, hematemesis or bleeding per rectum. Patient went to the urgent care and was found to have a hemoglobin of 9.8 and sent to the ED. In the ED blood work showed creatinine >10 and BUN of 40. He was taking high dose of NSAIDs about 3 months back for severe low back pain, prescribed by his chiropractor. Reports that he told them for less than 2 weeks. He was also found to have severe hyperkalemia and given insulin with dextrose. Admitted to hospitalist service for further management. Nephrology following.    Subjective:   Patient denies further nausea or vomiting today.   Assessment  & Plan :    Principal Problem:  Acute versus acute on chronic kidney disease UA showed proteinuria and WBC with hyaline cast. Also has high serum protein level. Ultrasound abdomen shows normal size kidneys. Acute interstitial injury versus multiple myeloma. Nephrology consulted and recommends inpatient dialysis and renal biopsy. Labs ordered for ANA, complements, IFE, ANCA and hepatitis panel.   Active Problems:  Vitamin B12 deficiency Significantly low levels  (106). Will add supplement. Check folate.  Anemia Iron panel suggestive of chronic disease however fecal Hemoccult positive. Monitor closely. Will need GI evaluation once acute workup completed.   Thrombocytopenia No prior lab in the system. Check peripheral smear.   Hyperkalemia Improved after IV insulin and dextrose. Dialysis planned after catheter placement tomorrow.    Malnutrition of moderate degree        Code Status :  Full code  Family Communication  : Wife and children at bedside  Disposition Plan  : Currently inpatient.  Barriers For Discharge : Acute kidney injury requiring RRT  Consults  :  Nephrology (Dr. Raef Footman) IR   Procedures  :  Renal ultrasound  DVT Prophylaxis  :  SCDs   Lab Results  Component Value Date   PLT 105* 05/09/2016    Antibiotics  :  None  Anti-infectives    None        Objective:   Filed Vitals:   05/10/16 0447 05/10/16 0615 05/10/16 0619 05/10/16 0758  BP: 152/83  133/63 135/64  Pulse: 91  88 86  Temp:   98.7 F (37.1 C) 98.1 F (36.7 C)  TempSrc:   Oral Oral  Resp: _0 Height:  _1  (1.854 m) _2  (1.854 m)   Weight:   68 kg (149 lb 14.6 oz)   SpO2: 100%  100% 100%    Wt Readings from Last 3 Encounters:  05/10/16 68 kg (149 lb 14.6 oz)     Intake/Output Summary (Last 24 hours) at 05/10/16 1529 Last data filed at 05/10/16 1500  Gross per 24 hour  Intake    600 ml  Output    125 ml  Net    475 ml     Physical Exam  Gen: not in distress HEENT: Pallor present, moist mucosa, supple neck Chest: clear b/l, no added sounds CVS: N S1&S2, no murmurs, rubs or gallop GI: soft, NT, ND, BS+ Musculoskeletal: warm, no edema CNS: AAOX3, non focal    Data Review:    CBC  Recent Labs Lab 05/09/16 2144 05/10/16 0247  WBC 6.2  --   HGB 9.2* 9.5*  HCT 26.5* 28.0*  PLT 105*  --   MCV 85.8  --   MCH 29.8  --   MCHC 34.7  --   RDW 12.3  --   LYMPHSABS 1.1  --   MONOABS 0.6  --   EOSABS 0.1   --   BASOSABS 0.0  --     Chemistries   Recent Labs Lab 05/10/16 0247 05/10/16 0310 05/10/16 0318  NA 135  --  133*  K 6.7*  --  5.2*  CL 106  --  102  CO2  --   --  18*  GLUCOSE 157*  --  120*  BUN 61*  --  43*  CREATININE 10.50*  --  10.68*  CALCIUM  --   --  8.7*  MG  --  1.9  --   AST  --  13*  --   ALT  --  9*  --   ALKPHOS  --  72  --   BILITOT  --  0.4  --    ------------------------------------------------------------------------------------------------------------------ No results for input(s): CHOL, HDL, LDLCALC, TRIG, CHOLHDL, LDLDIRECT in the last 72 hours.  No results found for: HGBA1C ------------------------------------------------------------------------------------------------------------------ No results for input(s): TSH, T4TOTAL, T3FREE, THYROIDAB in the last 72 hours.  Invalid input(s): FREET3 ------------------------------------------------------------------------------------------------------------------  Recent Labs  05/10/16 1156  VITAMINB12 106*  TIBC 167*  IRON 64    Coagulation profile  Recent Labs Lab 05/10/16 1156  INR 1.40    No results for input(s): DDIMER in the last 72 hours.  Cardiac Enzymes No results for input(s): CKMB, TROPONINI, MYOGLOBIN in the last 168 hours.  Invalid input(s): CK ------------------------------------------------------------------------------------------------------------------ No results found for: BNP  Inpatient Medications  Scheduled Meds: . feeding supplement (NEPRO CARB  STEADY)  237 mL Oral BID BM   Continuous Infusions:  PRN Meds:.  Micro Results No results found for this or any previous visit (from the past 240 hour(s)).  Radiology Reports Dg Chest 2 View  05/10/2016  CLINICAL DATA:  Congestive heart failure. EXAM: CHEST  2 VIEW COMPARISON:  None. FINDINGS: The heart size and mediastinal contours are within normal limits. No pneumothorax or pleural effusion is noted. Right lung is  clear. Density is seen in left lower lobe which may represent either pleural-based mass or possibly expansile lesion within left posterior rib. The visualized skeletal structures are unremarkable. IMPRESSION: Left lower lobe density is noted representing pleural-based mass or expansile lesion within left posterior rib. CT scan of the chest is recommended to evaluate for possible neoplasm or malignancy. Electronically Signed   By: Marijo Conception, M.D.   On: 05/10/2016 13:08   US Renal  05/10/2016  CLINICAL DATA:  Acute kidney injury. EXAM: RENAL / URINARY TRACT ULTRASOUND COMPLETE COMPARISON:  None. FINDINGS: Right Kidney: Length: 11.4 cm. Echogenicity within normal limits. No mass or hydronephrosis visualized. Left Kidney: Length: 11.6 cm. Echogenicity within normal limits. There is a 2.3 x 2.1 x 2.1 cm cyst in the upper pole. No fall with mass or hydronephrosis visualized. Bladder: Appears normal for degree of bladder distention. IMPRESSION: 1. Left renal cyst.  Otherwise unremarkable renal ultrasound. 2. No obstructive uropathy. Electronically Signed   By: Jeb Levering M.D.   On: 05/10/2016 04:29    Time Spent in minutes  25   Louellen Molder M.D on 05/10/2016 at 3:29 PM  Between 7am to 7pm - Pager - [phone]  After 7pm go to [email or web address] - password [email or web address]  [facility] -  Office  [phone]

## 2016-05-10 NOTE — Progress Notes (Signed)
Initial Nutrition Assessment  DOCUMENTATION CODES:   Non-severe (moderate) malnutrition in context of chronic illness  INTERVENTION:  Continue Nepro Shake po BID, each supplement provides 425 kcal and 19 grams protein.  Encourage adequate PO intake.  NUTRITION DIAGNOSIS:   Malnutrition related to chronic illness as evidenced by moderate depletions of muscle mass, moderate depletion of body fat.  GOAL:   Patient will meet greater than or equal to 90% of their needs  MONITOR:   PO intake, Supplement acceptance, Weight trends, Labs, I & O's  REASON FOR ASSESSMENT:   Malnutrition Screening Tool    ASSESSMENT:   70 y.o. male with medical history significant of not seeing a physician in many years. Patient has been on very high dose NSAIDS recently due to development of back pain. After his family member found out about this they cut down on the amount of NSAIDs he was taking. However over the past 1 month or so he has had almost daily emesis in the morning. In the ED the patient is found to be in frank renal failure with creatinine >10.  Pt reports appetite is improving. Meal completion has been varied from 15-100%. When pt was asked about how he was eating PTA, pt replied with him eating whenever he can. Family at bedside reports pt eats well. Pt reports he had just consumed a cheeseburger the couple of days PTA with no other difficulties. Per MD note, it was reported pt with n/v lasting for 1 month. Pt does report weight loss with usual body weight of ~159 lbs which he reports last weighing in March 2017 prior to his back issue. Pt with a 6.3% weight loss in 2 months. Pt currently has Nepro Shake ordered and has been consuming them. RD to continue with current orders. Pt does reports consume Ensure on occasion at home. Plans for tunneled dialysis catheter placement and renal biopsy tomorrow. Family at bedside requested information on a renal diet. Education was given.    Nutrition-Focused physical exam completed. Findings are moderate fat depletion, moderate muscle depletion, and no edema.   Labs: Low sodium, CO2, calcium, and GFR. High BUN, creatinine, potassium (5.2), uric acid.   Diet Order:  Diet renal with fluid restriction Fluid restriction:: 2000 mL Fluid; Room service appropriate?: Yes; Fluid consistency:: Thin Diet NPO time specified Except for: Sips with Meds  Skin:  Reviewed, no issues  Last BM:  Unknown  Height:   Ht Readings from Last 1 Encounters:  05/10/16 6\' 1"  (1.854 m)    Weight:   Wt Readings from Last 1 Encounters:  05/10/16 149 lb 14.6 oz (68 kg)    Ideal Body Weight:  83.6 kg  BMI:  Body mass index is 19.78 kg/(m^2).  Estimated Nutritional Needs:   Kcal:  1900-2100  Protein:  85-95 grams  Fluid:  2 L/day  EDUCATION NEEDS:   Education needs addressed  Corrin Parker, MS, RD, LDN Pager # [phone] After hours/ weekend pager # [phone]

## 2016-05-10 NOTE — ED Notes (Signed)
Report given to [facility]. 

## 2016-05-10 NOTE — Consult Note (Signed)
Chief Complaint: Patient was seen in consultation today for tunneled dialysis catheter placement and random renal biopsy Chief Complaint  Patient presents with  . low hgb    at the request of Dr Jeneen Rinks Deterding  Referring Physician(s): Dr Jeneen Rinks Deterding  Supervising Physician: Markus Daft  Patient Status: In-pt   History of Present Illness: Thomas [name] is a 70 y.o. male   Pt has treated chronic worsening back pain x 1 yr Discovered compression fracture Has been treated with NSAIDS and Prednisone Has had N/V/D x 1 week---  No relief Presents to ED this am Work up reveals uremia; acidemic; hyperkalemic Creatinine 10.68 Proteinuria Request for random renal biopsy and tunneled dialysis catheter placement Dr Deterding asking for procedures to be performed same day Dr Anselm Pancoast and Dr Deterding have discussed Will plan for both procedures 6/1   Past Medical History  Diagnosis Date  . Compression fracture     Past Surgical History  Procedure Laterality Date  . Mouth surgery      Allergies: Codeine and Penicillins  Medications: Prior to Admission medications   Not on File     Family History  Problem Relation Age of Onset  . Diabetes Father     Social History   Social History  . Marital Status: Married    Spouse Name: N/A  . Number of Children: N/A  . Years of Education: N/A   Social History Main Topics  . Smoking status: Former Research scientist (life sciences)  . Smokeless tobacco: None  . Alcohol Use: No  . Drug Use: No  . Sexual Activity: Not Asked   Other Topics Concern  . None   Social History Narrative     Review of Systems: A 12 point ROS discussed and pertinent positives are indicated in the HPI above.  All other systems are negative.  Review of Systems  Constitutional: Positive for appetite change and fatigue. Negative for fever, diaphoresis, activity change and unexpected weight change.  Respiratory: Negative for shortness of breath.   Gastrointestinal:  Positive for nausea, vomiting and diarrhea.  Musculoskeletal: Negative for back pain and gait problem.  Neurological: Positive for weakness.  Psychiatric/Behavioral: Negative for behavioral problems and confusion.    Vital Signs: BP 135/64 mmHg  Pulse 86  Temp(Src) 98.1 F (36.7 C) (Oral)  Resp 18  Ht 6\' 1"  (1.854 m)  Wt 149 lb 14.6 oz (68 kg)  BMI 19.78 kg/m2  SpO2 100%  Physical Exam  Constitutional: He is oriented to person, place, and time.  Cardiovascular: Normal rate, regular rhythm and normal heart sounds.   Pulmonary/Chest: Effort normal. He has no wheezes.  Abdominal: Soft. There is no tenderness.  Musculoskeletal: Normal range of motion.  Neurological: He is alert and oriented to person, place, and time.  Skin: Skin is warm and dry.  Psychiatric: He has a normal mood and affect. His behavior is normal. Judgment and thought content normal.  Nursing note and vitals reviewed.   Mallampati Score:  MD Evaluation Airway: WNL Heart: WNL Abdomen: WNL Chest/ Lungs: WNL ASA  Classification: 3 Mallampati/Airway Score: One  Imaging: Dg Chest 2 View  05/10/2016  CLINICAL DATA:  Congestive heart failure. EXAM: CHEST  2 VIEW COMPARISON:  None. FINDINGS: The heart size and mediastinal contours are within normal limits. No pneumothorax or pleural effusion is noted. Right lung is clear. Density is seen in left lower lobe which may represent either pleural-based mass or possibly expansile lesion within left posterior rib. The visualized skeletal structures are unremarkable.  IMPRESSION: Left lower lobe density is noted representing pleural-based mass or expansile lesion within left posterior rib. CT scan of the chest is recommended to evaluate for possible neoplasm or malignancy. Electronically Signed   By: Marijo Conception, M.D.   On: 05/10/2016 13:08   US Renal  05/10/2016  CLINICAL DATA:  Acute kidney injury. EXAM: RENAL / URINARY TRACT ULTRASOUND COMPLETE COMPARISON:  None.  FINDINGS: Right Kidney: Length: 11.4 cm. Echogenicity within normal limits. No mass or hydronephrosis visualized. Left Kidney: Length: 11.6 cm. Echogenicity within normal limits. There is a 2.3 x 2.1 x 2.1 cm cyst in the upper pole. No fall with mass or hydronephrosis visualized. Bladder: Appears normal for degree of bladder distention. IMPRESSION: 1. Left renal cyst.  Otherwise unremarkable renal ultrasound. 2. No obstructive uropathy. Electronically Signed   By: Jeb Levering M.D.   On: 05/10/2016 04:29    Labs:  CBC:  Recent Labs  05/09/16 2144 05/10/16 0247  WBC 6.2  --   HGB 9.2* 9.5*  HCT 26.5* 28.0*  PLT 105*  --     COAGS:  Recent Labs  05/10/16 1156  INR 1.40  APTT 33    BMP:  Recent Labs  05/10/16 0247 05/10/16 0318  NA 135 133*  K 6.7* 5.2*  CL 106 102  CO2  --  18*  GLUCOSE 157* 120*  BUN 61* 43*  CALCIUM  --  8.7*  CREATININE 10.50* 10.68*  GFRNONAA  --  4*  GFRAA  --  5*    LIVER FUNCTION TESTS:  Recent Labs  05/10/16 0310  BILITOT 0.4  AST 13*  ALT 9*  ALKPHOS 72  PROT 9.7*  ALBUMIN 2.8*    TUMOR MARKERS: No results for input(s): AFPTM, CEA, CA199, CHROMGRNA in the last 8760 hours.  Assessment and Plan:   New dx renal failure Cr 10.6 Uremia; hyperkalemia Scheduled for random renal biopsy and tunneled dialysis catheter placement Risks and Benefits discussed with the patient including, but not limited to bleeding, infection, damage to adjacent structures or low yield requiring additional tests. All of the patient's questions were answered, patient is agreeable to proceed. Consent signed and in chart. Risks and Benefits discussed with the patient including, but not limited to bleeding, infection, vascular injury, pneumothorax which may require chest tube placement, air embolism or even death All of the patient's questions were answered, patient is agreeable to proceed. Consent signed and in chart.   Thank you for this  interesting consult.  I greatly enjoyed meeting [name] and look forward to participating in their care.  A copy of this report was sent to the requesting provider on this date.  Electronically Signed: Monia Sabal A 05/10/2016, 1:26 PM   I spent a total of 40 Minutes    in face to face in clinical consultation, greater than 50% of which was counseling/coordinating care for tunneled HD cath and random renal bx

## 2016-05-11 ENCOUNTER — Inpatient Hospital Stay (HOSPITAL_COMMUNITY): Payer: PPO

## 2016-05-11 DIAGNOSIS — C888 Other malignant immunoproliferative diseases: Secondary | ICD-10-CM

## 2016-05-11 DIAGNOSIS — D696 Thrombocytopenia, unspecified: Secondary | ICD-10-CM

## 2016-05-11 LAB — BASIC METABOLIC PANEL
ANION GAP: 10 (ref 5–15)
BUN: 47 mg/dL — ABNORMAL HIGH (ref 6–20)
CALCIUM: 8.5 mg/dL — AB (ref 8.9–10.3)
CO2: 20 mmol/L — ABNORMAL LOW (ref 22–32)
CREATININE: 12.25 mg/dL — AB (ref 0.61–1.24)
Chloride: 102 mmol/L (ref 101–111)
GFR, EST AFRICAN AMERICAN: 4 mL/min — AB (ref >60)
GFR, EST NON AFRICAN AMERICAN: 4 mL/min — AB (ref >60)
Glucose, Bld: 85 mg/dL (ref 65–99)
Potassium: 5.1 mmol/L (ref 3.5–5.1)
SODIUM: 132 mmol/L — AB (ref 135–145)

## 2016-05-11 LAB — HEPATITIS B SURFACE ANTIBODY,QUALITATIVE: HEP B S AB: NONREACTIVE

## 2016-05-11 LAB — CBC
HCT: 20.6 % — ABNORMAL LOW (ref 39.0–52.0)
HEMATOCRIT: 22.1 % — AB (ref 39.0–52.0)
HEMOGLOBIN: 7.5 g/dL — AB (ref 13.0–17.0)
HEMOGLOBIN: 6.9 g/dL — AB (ref 13.0–17.0)
MCH: 29.3 pg (ref 26.0–34.0)
MCH: 29.4 pg (ref 26.0–34.0)
MCHC: 33.9 g/dL (ref 30.0–36.0)
MCHC: 33.5 g/dL (ref 30.0–36.0)
MCV: 86.3 fL (ref 78.0–100.0)
MCV: 87.7 fL (ref 78.0–100.0)
Platelets: 77 10*3/uL — ABNORMAL LOW (ref 150–400)
Platelets: 64 10*3/uL — ABNORMAL LOW (ref 150–400)
RBC: 2.56 MIL/uL — ABNORMAL LOW (ref 4.22–5.81)
RBC: 2.35 MIL/uL — AB (ref 4.22–5.81)
RDW: 12.2 % (ref 11.5–15.5)
RDW: 12.4 % (ref 11.5–15.5)
WBC: 4.3 10*3/uL (ref 4.0–10.5)
WBC: 3.9 10*3/uL — ABNORMAL LOW (ref 4.0–10.5)

## 2016-05-11 LAB — FOLATE RBC
FOLATE, HEMOLYSATE: 201.6 ng/mL
Folate, RBC: 884 ng/mL (ref >498)
HEMATOCRIT: 22.8 % — AB (ref 37.5–51.0)

## 2016-05-11 LAB — HEPATITIS B SURFACE ANTIGEN: Hepatitis B Surface Ag: NEGATIVE

## 2016-05-11 LAB — MPO/PR-3 (ANCA) ANTIBODIES
ANCA Proteinase 3: <3.5 U/mL (ref 0.0–3.5)
Myeloperoxidase Abs: <9 U/mL (ref 0.0–9.0)

## 2016-05-11 LAB — PROTIME-INR
INR: 1.46 (ref 0.00–1.49)
PROTHROMBIN TIME: 17.8 s — AB (ref 11.6–15.2)

## 2016-05-11 LAB — ANTISTREPTOLYSIN O TITER: ASO: <20 IU/mL (ref 0.0–200.0)

## 2016-05-11 LAB — HEPATITIS B CORE ANTIBODY, IGM: Hep B C IgM: NEGATIVE

## 2016-05-11 LAB — APTT: aPTT: 31 seconds (ref 24–37)

## 2016-05-11 LAB — C3 COMPLEMENT: C3 COMPLEMENT: 85 mg/dL (ref 82–167)

## 2016-05-11 LAB — PREPARE RBC (CROSSMATCH)

## 2016-05-11 LAB — PARATHYROID HORMONE, INTACT (NO CA): PTH: 38 pg/mL (ref 15–65)

## 2016-05-11 LAB — HEPATITIS C ANTIBODY (REFLEX): HCV Ab: <0.1 s/co ratio (ref 0.0–0.9)

## 2016-05-11 LAB — ANTINUCLEAR ANTIBODIES, IFA: ANTINUCLEAR ANTIBODIES, IFA: NEGATIVE

## 2016-05-11 LAB — HCV COMMENT:

## 2016-05-11 LAB — C4 COMPLEMENT: COMPLEMENT C4, BODY FLUID: 43 mg/dL (ref 14–44)

## 2016-05-11 MED ORDER — MIDAZOLAM HCL 2 MG/2ML IJ SOLN
INTRAMUSCULAR | Status: AC | PRN
  Administered 2016-05-11: 0.5 mg via INTRAVENOUS
  Administered 2016-05-11: 1 mg via INTRAVENOUS

## 2016-05-11 MED ORDER — SODIUM CHLORIDE 0.9 % IV SOLN
Freq: Once | INTRAVENOUS | Status: AC
  Administered 2016-05-14: 10:00:00 via INTRAVENOUS

## 2016-05-11 MED ORDER — VITAMIN K1 10 MG/ML IJ SOLN
10.0000 mg | Freq: Once | INTRAMUSCULAR | Status: AC
  Administered 2016-05-11: 10 mg via SUBCUTANEOUS
  Filled 2016-05-11: qty 1

## 2016-05-11 MED ORDER — MIDAZOLAM HCL 2 MG/2ML IJ SOLN
INTRAMUSCULAR | Status: AC
  Filled 2016-05-11: qty 2

## 2016-05-11 MED ORDER — FENTANYL CITRATE (PF) 100 MCG/2ML IJ SOLN
INTRAMUSCULAR | Status: AC
  Filled 2016-05-11: qty 2

## 2016-05-11 MED ORDER — GELATIN ABSORBABLE 12-7 MM EX MISC
CUTANEOUS | Status: AC
  Filled 2016-05-11: qty 1

## 2016-05-11 MED ORDER — DESMOPRESSIN ACETATE 4 MCG/ML IJ SOLN: 4.0000 ug | INTRAMUSCULAR | Status: DC

## 2016-05-11 MED ORDER — HEPARIN SODIUM (PORCINE) 1000 UNIT/ML IJ SOLN
INTRAMUSCULAR | Status: AC
  Filled 2016-05-11: qty 1

## 2016-05-11 MED ORDER — LIDOCAINE HCL 1 % IJ SOLN
INTRAMUSCULAR | Status: AC
  Filled 2016-05-11: qty 20

## 2016-05-11 MED ORDER — VANCOMYCIN HCL IN DEXTROSE 1-5 GM/200ML-% IV SOLN
INTRAVENOUS | Status: AC
  Filled 2016-05-11: qty 200

## 2016-05-11 MED ORDER — FENTANYL CITRATE (PF) 100 MCG/2ML IJ SOLN
INTRAMUSCULAR | Status: AC | PRN
  Administered 2016-05-11 (×2): 25 ug via INTRAVENOUS

## 2016-05-11 MED ORDER — SODIUM CHLORIDE 0.9 % IV SOLN
0.3000 ug/kg | Freq: Once | INTRAVENOUS | Status: AC
  Administered 2016-05-11: 21.6 ug via INTRAVENOUS
  Filled 2016-05-11 (×2): qty 5.4

## 2016-05-11 MED ORDER — VANCOMYCIN HCL IN DEXTROSE 1-5 GM/200ML-% IV SOLN
1000.0000 mg | INTRAVENOUS | Status: AC
  Administered 2016-05-11: 1000 mg via INTRAVENOUS
  Filled 2016-05-11: qty 200

## 2016-05-11 NOTE — Sedation Documentation (Signed)
Patient is resting comfortably. 

## 2016-05-11 NOTE — Sedation Documentation (Signed)
Vital signs stable. 

## 2016-05-11 NOTE — Sedation Documentation (Signed)
Patient is resting comfortably. Vital signs stable. 

## 2016-05-11 NOTE — Sedation Documentation (Signed)
Per MD, pt needs to be prone until 1500, strict bedrest until 1900 and bedrest with BRP until tomorrow morning.

## 2016-05-11 NOTE — Sedation Documentation (Signed)
Patient is resting comfortably. No complaints at this time 

## 2016-05-11 NOTE — Progress Notes (Signed)
Pts incision site CDI with band aid present. Site assessed Q15x4, Q 30 x2. Pt complaining of no pain at this time.

## 2016-05-11 NOTE — Progress Notes (Signed)
Subjective: Interval History: has no complaint, back a little better.  Objective: Vital signs in last 24 hours: Temp:  [98.4 F (36.9 C)-98.5 F (36.9 C)] 98.4 F (36.9 C) (06/01 0509) Pulse Rate:  [81-88] 88 (06/01 0509) Resp:  [16-18] 16 (06/01 0509) BP: (131-140)/(58-68) 133/68 mmHg (06/01 0509) SpO2:  [99 %-100 %] 99 % (06/01 0509) Weight:  [72.394 kg (159 lb 9.6 oz)] 72.394 kg (159 lb 9.6 oz) (05/31 2100) Weight change: 0.272 kg (9.6 oz)  Intake/Output from previous day: 05/31 0701 - 06/01 0700 In: 675 [P.O.:675] Out: 376 [Urine:375; Emesis/NG output:1] Intake/Output this shift:    General appearance: alert, cooperative, no distress and pale Resp: clear to auscultation bilaterally Cardio: S1, S2 normal and systolic murmur: systolic ejection 2/6, decrescendo at 2nd left intercostal space GI: soft, non-tender; bowel sounds normal; no masses,  no organomegaly Extremities: extremities normal, atraumatic, no cyanosis or edema  Lab Results:  Recent Labs  05/09/16 2144 05/10/16 0247  WBC 6.2  --   HGB 9.2* 9.5*  HCT 26.5* 28.0*  PLT 105*  --    BMET:  Recent Labs  05/10/16 0318 05/11/16 0740  NA 133* 132*  K 5.2* 5.1  CL 102 102  CO2 18* 20*  GLUCOSE 120* 85  BUN 43* 47*  CREATININE 10.68* 12.25*  CALCIUM 8.7* 8.5*   No results for input(s): PTH in the last 72 hours. Iron Studies:  Recent Labs  05/10/16 1156  IRON 64  TIBC 167*    Studies/Results: Dg Chest 2 View  05/10/2016  CLINICAL DATA:  Congestive heart failure. EXAM: CHEST  2 VIEW COMPARISON:  None. FINDINGS: The heart size and mediastinal contours are within normal limits. No pneumothorax or pleural effusion is noted. Right lung is clear. Density is seen in left lower lobe which may represent either pleural-based mass or possibly expansile lesion within left posterior rib. The visualized skeletal structures are unremarkable. IMPRESSION: Left lower lobe density is noted representing pleural-based  mass or expansile lesion within left posterior rib. CT scan of the chest is recommended to evaluate for possible neoplasm or malignancy. Electronically Signed   By: Marijo Conception, M.D.   On: 05/10/2016 13:08   Dg Lumbar Spine 2-3 Views  05/10/2016  CLINICAL DATA:  Lower lumbar pain radiating down the left leg. Injury in February 2017. EXAM: LUMBAR SPINE - 2-3 VIEW COMPARISON:  None. FINDINGS: Transitional S1 vertebra. Abnormal lucency in the left L5 vertebra appreciable on the frontal projection, potentially with some slight compression in this vicinity, but with lucency out of proportion to the compression. Irregular margins of the cortex of the S1 vertebra on the lateral projection. Anterior wedge compression fracture at L3 with 30% loss of vertebral body height. Aortoiliac atherosclerotic vascular disease. IMPRESSION: 1. Compression fractures at L3 and L5, with abnormal lucency in the left side of the L5 vertebral body raising concern for multiple myeloma or lytic metastatic disease from a radiographic standpoint. MRI could be utilized for further characterization if clinically warranted. 2. There is also cortical thickening and irregularity of the S1 vertebra, underlying malignancy not excluded at S 1. 3. 30% compression fracture at L 3, age indeterminate. 4. Lumbar spondylosis. 5.  Aortoiliac atherosclerotic vascular disease. Electronically Signed   By: Van Clines M.D.   On: 05/10/2016 16:48   US Renal  05/10/2016  CLINICAL DATA:  Acute kidney injury. EXAM: RENAL / URINARY TRACT ULTRASOUND COMPLETE COMPARISON:  None. FINDINGS: Right Kidney: Length: 11.4 cm. Echogenicity within normal limits.  No mass or hydronephrosis visualized. Left Kidney: Length: 11.6 cm. Echogenicity within normal limits. There is a 2.3 x 2.1 x 2.1 cm cyst in the upper pole. No fall with mass or hydronephrosis visualized. Bladder: Appears normal for degree of bladder distention. IMPRESSION: 1. Left renal cyst.  Otherwise  unremarkable renal ultrasound. 2. No obstructive uropathy. Electronically Signed   By: Jeb Levering M.D.   On: 05/10/2016 04:29    I have reviewed the patient's current medications.  Assessment/Plan: 1 AKI/CKD  For bx and PC today.  Will do HD in am.  immunologic tests neg so far.  Suspect dysproteinemia vs AIN or both 2 anemia 3 low ptlts 4 comp fx has lytic lesions will get renal bx and will need bone marrow, will get skull film P Renal bx, PC, follow Hb, chem    LOS: 1 day   Andreanna Mikolajczak L 05/11/2016,9:26 AM

## 2016-05-11 NOTE — Sedation Documentation (Signed)
Patient is resting comfortably. Transitioning pt for renal biopsy

## 2016-05-11 NOTE — Progress Notes (Signed)
PROGRESS NOTE                                                                                                                                                                                                             Patient Demographics:    Thomas [name], is a 70 y.o. male, DOB - [birth year], [id number]  Admit date - 05/09/2016   Admitting Physician Etta Quill, DO  Outpatient Primary MD for the patient is No PCP Per Patient  LOS - 1  Outpatient Specialists: none  Chief Complaint  Patient presents with  . low hgb        Brief Narrative   70 year old male with no significant medical history (had not seen a physician in many years) presented with about 1 month of nausea with daily vomiting. Also reports poor by mouth intake and subjective weight loss. Denied any diarrhea, hematemesis or bleeding per rectum. Patient went to the urgent care and was found to have a hemoglobin of 9.8 and sent to the ED. In the ED blood work showed creatinine >10 and BUN of 40. He was taking high dose of NSAIDs about 3 months back for severe low back pain, prescribed by his chiropractor. Reports that he told them for less than 2 weeks. He was also found to have severe hyperkalemia and given insulin with dextrose. Admitted to hospitalist service for further management.  Lab work showing hyperproteinemia, abnormal lucency of lumbar vertebrae and multiple radiolucencies in the skull x-ray indicated towards multiple myeloma.    Subjective:   Patient denies further nausea or vomiting today.   Assessment  & Plan :    Principal Problem:  Acute versus acute on chronic kidney disease UA showed proteinuria and WBC with hyaline cast. Also has high serum protein level. Ultrasound abdomen shows normal size kidneys.  Given multiple lucencies on lumbar and skull x-rays with hyperproteinemia high concern for multiple myeloma. Cannot rule out acute  interstitial nephritis. -ANA and complements, complements, ANCA and hepatitis panel negative. -Pending SPEP, And the change and IFE. -Renal biopsy done and dialysis catheter placed with plan on starting dialysis tomorrow.   Active Problems:  Vitamin B12 deficiency Significantly low levels (106). Added supplements.  Anemia with thrombocytopenia. Iron panel suggestive of chronic disease however fecal Hemoccult positive. Monitor closely. Will need GI evaluation once acute workup completed.  Progressive thrombocytopenia. Platelet function assay is high.   Thrombocytopenia No prior lab in the system. Check peripheral smear. Avoid heparin products. Will consult hematology in the morning.  Hyperkalemia Improved after IV insulin and dextrose. Starting dialysis from tomorrow.    Malnutrition of moderate degree        Code Status : Full code  Family Communication  : Wife and children at bedside  Disposition Plan  : Currently inpatient.  Barriers For Discharge : Acute kidney injury requiring RRT. Workup for multiple myeloma.  Consults  :  Nephrology (Dr. Ludwig Footman) IR   Procedures  :  Renal ultrasound Dialysis catheter placement Left Renal biopsy  DVT Prophylaxis  :  SCDs   Lab Results  Component Value Date   PLT 77* 05/11/2016    Antibiotics  :  None  Anti-infectives    Start     Dose/Rate Route Frequency Ordered Stop   05/11/16 1225  vancomycin (VANCOCIN) 1-5 GM/200ML-% IVPB  Status:  Discontinued    CommentsKarlton Lemon, Whitney   : cabinet override      05/11/16 1225 05/11/16 1234   05/11/16 1100  vancomycin (VANCOCIN) IVPB 1000 mg/200 mL premix     1,000 mg 200 mL/hr over 60 Minutes Intravenous To Radiology 05/11/16 0911 05/11/16 1354        Objective:   Filed Vitals:   05/11/16 1345 05/11/16 1353 05/11/16 1500 05/11/16 1515  BP: 126/66 115/60 109/59 124/61  Pulse: 99 95    Temp:   98.1 F (36.7 C)   TempSrc:   Oral   Resp: 19 17    Height:        Weight:      SpO2: 98% 99% 99%     Wt Readings from Last 3 Encounters:  05/10/16 72.394 kg (159 lb 9.6 oz)     Intake/Output Summary (Last 24 hours) at 05/11/16 1538 Last data filed at 05/11/16 0700  Gross per 24 hour  Intake     75 ml  Output    251 ml  Net   -176 ml     Physical Exam  Gen: not in distress, Sleepy after returning from procedure. HEENT: Pallor present, moist mucosa, supple neck Chest: clear b/l, no added sounds CVS: N S1&S2, no murmurs, rubs or gallop GI: soft, NT, ND, BS+, left renal biopsy site appears clean. Musculoskeletal: warm, no edema CNS: Alert and oriented    Data Review:    CBC  Recent Labs Lab 05/09/16 2144 05/10/16 0247 05/11/16 0919  WBC 6.2  --  4.3  HGB 9.2* 9.5* 7.5*  HCT 26.5* 28.0* 22.1*  PLT 105*  --  77*  MCV 85.8  --  86.3  MCH 29.8  --  29.3  MCHC 34.7  --  33.9  RDW 12.3  --  12.2  LYMPHSABS 1.1  --   --   MONOABS 0.6  --   --   EOSABS 0.1  --   --   BASOSABS 0.0  --   --     Chemistries   Recent Labs Lab 05/10/16 0247 05/10/16 0310 05/10/16 0318 05/11/16 0740  NA 135  --  133* 132*  K 6.7*  --  5.2* 5.1  CL 106  --  102 102  CO2  --   --  18* 20*  GLUCOSE 157*  --  120* 85  BUN 61*  --  43* 47*  CREATININE 10.50*  --  10.68* 12.25*  CALCIUM  --   --  8.7* 8.5*  MG  --  1.9  --   --   AST  --  13*  --   --   ALT  --  9*  --   --   ALKPHOS  --  72  --   --   BILITOT  --  0.4  --   --    ------------------------------------------------------------------------------------------------------------------ No results for input(s): CHOL, HDL, LDLCALC, TRIG, CHOLHDL, LDLDIRECT in the last 72 hours.  No results found for: HGBA1C ------------------------------------------------------------------------------------------------------------------ No results for input(s): TSH, T4TOTAL, T3FREE, THYROIDAB in the last 72 hours.  Invalid input(s):  FREET3 ------------------------------------------------------------------------------------------------------------------  Recent Labs  05/10/16 1156  VITAMINB12 106*  TIBC 167*  IRON 64    Coagulation profile  Recent Labs Lab 05/10/16 1156 05/11/16 0500  INR 1.40 1.46    No results for input(s): DDIMER in the last 72 hours.  Cardiac Enzymes No results for input(s): CKMB, TROPONINI, MYOGLOBIN in the last 168 hours.  Invalid input(s): CK ------------------------------------------------------------------------------------------------------------------ No results found for: BNP  Inpatient Medications  Scheduled Meds: . feeding supplement (NEPRO CARB STEADY)  237 mL Oral BID BM  . fentaNYL      . gelatin adsorbable      . heparin      . lidocaine      . lidocaine      . midazolam      . phytonadione  10 mg Subcutaneous Once  . vitamin B-12  1,000 mcg Oral Daily   Continuous Infusions:  PRN Meds:.  Micro Results No results found for this or any previous visit (from the past 240 hour(s)).  Radiology Reports Dg Skull 1-3 Views  05/11/2016  CLINICAL DATA:  Myeloma EXAM: SKULL - 1-3 VIEW COMPARISON:  None. FINDINGS: Frontal view shows 13 mm lucency in the parasagittal left skull. On the lateral view approximately 10 round or oval lucencies are identified, the largest measuring 14 mm in maximal diameter. There are also lucencies in both mandibles. IMPRESSION: Multiple radiolucencies consistent with myeloma. Electronically Signed   By: Skipper Cliche M.D.   On: 05/11/2016 10:42   Dg Chest 2 View  05/10/2016  CLINICAL DATA:  Congestive heart failure. EXAM: CHEST  2 VIEW COMPARISON:  None. FINDINGS: The heart size and mediastinal contours are within normal limits. No pneumothorax or pleural effusion is noted. Right lung is clear. Density is seen in left lower lobe which may represent either pleural-based mass or possibly expansile lesion within left posterior rib. The  visualized skeletal structures are unremarkable. IMPRESSION: Left lower lobe density is noted representing pleural-based mass or expansile lesion within left posterior rib. CT scan of the chest is recommended to evaluate for possible neoplasm or malignancy. Electronically Signed   By: Marijo Conception, M.D.   On: 05/10/2016 13:08   Dg Lumbar Spine 2-3 Views  05/10/2016  CLINICAL DATA:  Lower lumbar pain radiating down the left leg. Injury in February 2017. EXAM: LUMBAR SPINE - 2-3 VIEW COMPARISON:  None. FINDINGS: Transitional S1 vertebra. Abnormal lucency in the left L5 vertebra appreciable on the frontal projection, potentially with some slight compression in this vicinity, but with lucency out of proportion to the compression. Irregular margins of the cortex of the S1 vertebra on the lateral projection. Anterior wedge compression fracture at L3 with 30% loss of vertebral body height. Aortoiliac atherosclerotic vascular disease. IMPRESSION: 1. Compression fractures at L3 and L5, with abnormal lucency in the left side of the L5 vertebral body raising concern for multiple myeloma  or lytic metastatic disease from a radiographic standpoint. MRI could be utilized for further characterization if clinically warranted. 2. There is also cortical thickening and irregularity of the S1 vertebra, underlying malignancy not excluded at S 1. 3. 30% compression fracture at L 3, age indeterminate. 4. Lumbar spondylosis. 5.  Aortoiliac atherosclerotic vascular disease. Electronically Signed   By: Van Clines M.D.   On: 05/10/2016 16:48   US Renal  05/10/2016  CLINICAL DATA:  Acute kidney injury. EXAM: RENAL / URINARY TRACT ULTRASOUND COMPLETE COMPARISON:  None. FINDINGS: Right Kidney: Length: 11.4 cm. Echogenicity within normal limits. No mass or hydronephrosis visualized. Left Kidney: Length: 11.6 cm. Echogenicity within normal limits. There is a 2.3 x 2.1 x 2.1 cm cyst in the upper pole. No fall with mass or  hydronephrosis visualized. Bladder: Appears normal for degree of bladder distention. IMPRESSION: 1. Left renal cyst.  Otherwise unremarkable renal ultrasound. 2. No obstructive uropathy. Electronically Signed   By: Jeb Levering M.D.   On: 05/10/2016 04:29    Time Spent in minutes  25   Louellen Molder M.D on 05/11/2016 at 3:38 PM  Between 7am to 7pm - Pager - [phone]  After 7pm go to [email or web address] - password [facility]  [facility] -  Office  [phone]

## 2016-05-11 NOTE — Progress Notes (Signed)
Results for Thomas [name], Thomas [name] (MRN [id number]) as of 05/11/2016 21:30  Ref. Range 05/11/2016 20:30  WBC Latest Ref Range: 4.0-10.5 K/uL 3.9 (L)  RBC Latest Ref Range: 4.22-5.81 MIL/uL 2.35 (L)  Hemoglobin Latest Ref Range: 13.0-17.0 g/dL 6.9 (LL)  HCT Latest Ref Range: 39.0-52.0 % 20.6 (L)  textting results to NP on call for [facility]

## 2016-05-11 NOTE — Procedures (Signed)
Post-Procedure Note  Pre-operative Diagnosis: Renal failure       Post-operative Diagnosis: Renal failure   Procedure Details:   HD catheter placement:  Placed right IJ HD catheter without complication.  Right renal biopsy with US guidance:  2 cores obtained from right kidney lower pole.  No immediate complication.  Findings: Catheter tip at SVC/RA junction.   Minimal bleeding around right kidney biopsy site.  Complications: None     Condition: Stable  Plan: Bedrest today.

## 2016-05-12 ENCOUNTER — Inpatient Hospital Stay (HOSPITAL_COMMUNITY): Payer: PPO

## 2016-05-12 DIAGNOSIS — D892 Hypergammaglobulinemia, unspecified: Secondary | ICD-10-CM

## 2016-05-12 DIAGNOSIS — M549 Dorsalgia, unspecified: Secondary | ICD-10-CM

## 2016-05-12 DIAGNOSIS — N179 Acute kidney failure, unspecified: Secondary | ICD-10-CM

## 2016-05-12 DIAGNOSIS — M899 Disorder of bone, unspecified: Secondary | ICD-10-CM

## 2016-05-12 DIAGNOSIS — M4856XA Collapsed vertebra, not elsewhere classified, lumbar region, initial encounter for fracture: Secondary | ICD-10-CM

## 2016-05-12 LAB — RENAL FUNCTION PANEL
ALBUMIN: 2.1 g/dL — AB (ref 3.5–5.0)
ANION GAP: 12 (ref 5–15)
BUN: 53 mg/dL — AB (ref 6–20)
CALCIUM: 8.2 mg/dL — AB (ref 8.9–10.3)
CO2: 17 mmol/L — ABNORMAL LOW (ref 22–32)
Chloride: 100 mmol/L — ABNORMAL LOW (ref 101–111)
Creatinine, Ser: 13.22 mg/dL — ABNORMAL HIGH (ref 0.61–1.24)
GFR calc Af Amer: 4 mL/min — ABNORMAL LOW (ref >60)
GFR, EST NON AFRICAN AMERICAN: 3 mL/min — AB (ref >60)
GLUCOSE: 111 mg/dL — AB (ref 65–99)
PHOSPHORUS: 6 mg/dL — AB (ref 2.5–4.6)
Potassium: 5.3 mmol/L — ABNORMAL HIGH (ref 3.5–5.1)
SODIUM: 129 mmol/L — AB (ref 135–145)

## 2016-05-12 LAB — PROTEIN ELECTROPHORESIS, SERUM
A/G RATIO SPE: 0.5 — AB (ref 0.7–1.7)
Albumin ELP: 2.9 g/dL (ref 2.9–4.4)
Alpha-1-Globulin: 0.3 g/dL (ref 0.0–0.4)
Alpha-2-Globulin: 0.8 g/dL (ref 0.4–1.0)
BETA GLOBULIN: 0.6 g/dL — AB (ref 0.7–1.3)
GAMMA GLOBULIN: 3.9 g/dL — AB (ref 0.4–1.8)
Globulin, Total: 5.6 g/dL — ABNORMAL HIGH (ref 2.2–3.9)
M-Spike, %: 3.6 g/dL — ABNORMAL HIGH
TOTAL PROTEIN ELP: 8.5 g/dL (ref 6.0–8.5)

## 2016-05-12 LAB — KAPPA/LAMBDA LIGHT CHAINS
Kappa, lambda light chain ratio: 847.3 — ABNORMAL HIGH (ref 0.26–1.65)
Lambda free light chains: 13.46 mg/L (ref 5.71–26.30)

## 2016-05-12 LAB — CBC
HEMATOCRIT: 21.9 % — AB (ref 39.0–52.0)
HEMOGLOBIN: 7.5 g/dL — AB (ref 13.0–17.0)
MCH: 29.3 pg (ref 26.0–34.0)
MCHC: 34.2 g/dL (ref 30.0–36.0)
MCV: 85.5 fL (ref 78.0–100.0)
Platelets: 67 10*3/uL — ABNORMAL LOW (ref 150–400)
RBC: 2.56 MIL/uL — AB (ref 4.22–5.81)
RDW: 12.5 % (ref 11.5–15.5)
WBC: 4.2 10*3/uL (ref 4.0–10.5)

## 2016-05-12 LAB — HEMOGLOBIN AND HEMATOCRIT, BLOOD
HCT: 23.1 % — ABNORMAL LOW (ref 39.0–52.0)
Hemoglobin: 7.8 g/dL — ABNORMAL LOW (ref 13.0–17.0)

## 2016-05-12 LAB — ABO/RH: ABO/RH(D): A POS

## 2016-05-12 MED ORDER — ALTEPLASE 2 MG IJ SOLR: 2.0000 mg | Freq: Once | INTRAMUSCULAR | Status: DC | PRN

## 2016-05-12 MED ORDER — LIDOCAINE HCL (PF) 1 % IJ SOLN: 5.0000 mL | INTRAMUSCULAR | Status: DC | PRN

## 2016-05-12 MED ORDER — SODIUM CHLORIDE 0.9 % IV SOLN: 100.0000 mL | INTRAVENOUS | Status: DC | PRN

## 2016-05-12 MED ORDER — HEPARIN SODIUM (PORCINE) 1000 UNIT/ML DIALYSIS: 1000.0000 [IU] | INTRAMUSCULAR | Status: DC | PRN

## 2016-05-12 MED ORDER — PENTAFLUOROPROP-TETRAFLUOROETH EX AERO: 1.0000 "application " | INHALATION_SPRAY | CUTANEOUS | Status: DC | PRN

## 2016-05-12 MED ORDER — DARBEPOETIN ALFA 200 MCG/0.4ML IJ SOSY
PREFILLED_SYRINGE | INTRAMUSCULAR | Status: AC
  Filled 2016-05-12: qty 0.4

## 2016-05-12 MED ORDER — DARBEPOETIN ALFA 200 MCG/0.4ML IJ SOSY
200.0000 ug | PREFILLED_SYRINGE | INTRAMUSCULAR | Status: AC
  Administered 2016-05-12: 200 ug via INTRAVENOUS
  Filled 2016-05-12: qty 0.4

## 2016-05-12 MED ORDER — LIDOCAINE-PRILOCAINE 2.5-2.5 % EX CREA: 1.0000 "application " | TOPICAL_CREAM | CUTANEOUS | Status: DC | PRN

## 2016-05-12 MED ORDER — ACETAMINOPHEN 325 MG PO TABS
650.0000 mg | ORAL_TABLET | Freq: Four times a day (QID) | ORAL | Status: DC | PRN
  Administered 2016-05-12: 650 mg via ORAL
  Filled 2016-05-12: qty 2

## 2016-05-12 NOTE — Care Management Important Message (Signed)
Important Message  Patient Details  Name: Thomas [name] MRN: [id number] Date of Birth: Aug 31, 1946   Medicare Important Message Given:  Yes    [name] 05/12/2016, 11:06 AM

## 2016-05-12 NOTE — Progress Notes (Signed)
[facility] CONSULT NOTE  Patient Care Team: No Pcp Per Patient as PCP - General (General Practice)  CHIEF COMPLAINTS/PURPOSE OF CONSULTATION:  Pancytopenia, renal failure, abnormal bone lesions, suspect multiple myeloma  HISTORY OF PRESENTING ILLNESS:  Thomas [name] 70 y.o. male is here because of severe nausea. He presented to urgent care and was noted to have renal failure and subsequently was admitted to [facility] on 05/10/2016 for further evaluation. The patient is otherwise healthy up until about a month ago when he developed significant back pain and has been taking NSAID. The back pain is located in the lower back radiating down to the left leg. He denies any trauma. He also saw orthopedic doctor who ordered x-rays of his back and joints 1 month ago and he was told he mainly has arthritis. He denies recent infection. The patient denies any recent signs or symptoms of bleeding such as spontaneous epistaxis, hematuria or hematochezia. He has reduced oral intake due to nausea and mild constipation. He also noted to have reduced urine output which he attributed to poor oral intake.  His admission CBC on 05/09/2016 show white count of 6.2, hemoglobin 9.2 and platelet of 105. With aggressive fluid resuscitation, his blood count has dropped with white blood cell count of 3.9, hemoglobin of 6.9 and platelet count 64,000. He received 1 unit of blood transfusion. Chest x-ray on 05/10/2016 show abnormal pleural-based mass or expansile lesion in the left lower rib.  Lumbar x-ray show abnormal lucency in the left L5 vertebral with evidence of compression fracture at L3  Skull x-ray on 05/11/2016 is positive for lytic lesions  The patient has placement of dialysis catheter with plan to start hemodialysis soon due to evidence of renal failure  Serum chemistries also show evidence of elevated total protein, low albumin, low calcium and high potassium. Serum iron, folate and  vitamin B-12 were unremarkable.  MEDICAL HISTORY:  Past Medical History  Diagnosis Date  . Compression fracture     SURGICAL HISTORY: Past Surgical History  Procedure Laterality Date  . Mouth surgery      SOCIAL HISTORY: Social History   Social History  . Marital Status: Married    Spouse Name: N/A  . Number of Children: N/A  . Years of Education: N/A   Occupational History  . Not on file.   Social History Main Topics  . Smoking status: Former Research scientist (life sciences)  . Smokeless tobacco: Not on file  . Alcohol Use: No  . Drug Use: No  . Sexual Activity: Not on file   Other Topics Concern  . Not on file   Social History Narrative    FAMILY HISTORY: Family History  Problem Relation Age of Onset  . Diabetes Father     ALLERGIES:  is allergic to codeine and penicillins.  MEDICATIONS:  Current Facility-Administered Medications  Medication Dose Route Frequency Provider Last Rate Last Dose  . 0.9 %  sodium chloride infusion   Intravenous Once [employer], PA-C      . Darbepoetin Alfa (ARANESP) injection 200 mcg  200 mcg Intravenous Q Fri-HD Mauricia Area, MD      . feeding supplement (NEPRO CARB STEADY) liquid 237 mL  237 mL Oral BID BM Etta Quill, DO   237 mL at 05/12/16 1000  . ondansetron (ZOFRAN) injection 4 mg  4 mg Intravenous Q6H PRN Fransico Meadow, PA-C      . vitamin B-12 (CYANOCOBALAMIN) tablet 1,000 mcg  1,000 mcg Oral Daily  Louellen Molder, MD   1,000 mcg at 05/12/16 1125    REVIEW OF SYSTEMS:   Constitutional: Denies fevers, chills or abnormal night sweats Eyes: Denies blurriness of vision, double vision or watery eyes Ears, nose, mouth, throat, and face: Denies mucositis or sore throat Respiratory: Denies cough, dyspnea or wheezes Cardiovascular: Denies palpitation, chest discomfort or lower extremity swelling Skin: Denies abnormal skin rashes Lymphatics: Denies new lymphadenopathy or easy bruising Neurological:Denies numbness, tingling or new  weaknesses Behavioral/Psych: Mood is stable, no new changes  All other systems were reviewed with the patient and are negative.  PHYSICAL EXAMINATION: ECOG PERFORMANCE STATUS: 1 - Symptomatic but completely ambulatory  Filed Vitals:   05/12/16 0445 05/12/16 0827  BP: 126/64 130/62  Pulse: 85 81  Temp: 97.5 F (36.4 C) 98.4 F (36.9 C)  Resp:  17   Filed Weights   05/10/16 0619 05/10/16 2100 05/11/16 2045  Weight: 149 lb 14.6 oz (68 kg) 159 lb 9.6 oz (72.394 kg) 160 lb 9.6 oz (72.848 kg)    GENERAL:alert, no distress and comfortable SKIN: skin color, texture, turgor are normal, no rashes or significant lesions EYES: normal, conjunctiva are pink and non-injected, sclera clear OROPHARYNX:no exudate, no erythema and lips, buccal mucosa, and tongue normal  NECK: supple, thyroid normal size, non-tender, without nodularity LYMPH:  no palpable lymphadenopathy in the cervical, axillary or inguinal LUNGS: clear to auscultation and percussion with normal breathing effort HEART: regular rate & rhythm and no murmurs and no lower extremity edema ABDOMEN:abdomen soft, non-tender and normal bowel sounds Musculoskeletal:no cyanosis of digits and no clubbing  PSYCH: alert & oriented x 3 with fluent speech NEURO: no focal motor/sensory deficits  LABORATORY DATA:  I have reviewed the data as listed Lab Results  Component Value Date   WBC 3.9* 05/11/2016   HGB 7.8* 05/12/2016   HCT 23.1* 05/12/2016   MCV 87.7 05/11/2016   PLT 64* 05/11/2016    Recent Labs  05/10/16 0310 05/10/16 0318 05/11/16 0740 05/12/16 0913  NA  --  133* 132* 129*  K  --  5.2* 5.1 5.3*  CL  --  102 102 100*  CO2  --  18* 20* 17*  GLUCOSE  --  120* 85 111*  BUN  --  43* 47* 53*  CREATININE  --  10.68* 12.25* 13.22*  CALCIUM  --  8.7* 8.5* 8.2*  GFRNONAA  --  4* 4* 3*  GFRAA  --  5* 4* 4*  PROT 9.7*  --   --   --   ALBUMIN 2.8*  --   --  2.1*  AST 13*  --   --   --   ALT 9*  --   --   --   ALKPHOS 72  --    --   --   BILITOT 0.4  --   --   --   BILIDIR 0.1  --   --   --   IBILI 0.3  --   --   --     RADIOGRAPHIC STUDIES: I have personally reviewed the radiological images as listed and agreed with the findings in the report. Dg Skull 1-3 Views  05/11/2016  CLINICAL DATA:  Myeloma EXAM: SKULL - 1-3 VIEW COMPARISON:  None. FINDINGS: Frontal view shows 13 mm lucency in the parasagittal left skull. On the lateral view approximately 10 round or oval lucencies are identified, the largest measuring 14 mm in maximal diameter. There are also lucencies in both mandibles. IMPRESSION: Multiple  radiolucencies consistent with myeloma. Electronically Signed   By: Skipper Cliche M.D.   On: 05/11/2016 10:42   Dg Chest 2 View  05/10/2016  CLINICAL DATA:  Congestive heart failure. EXAM: CHEST  2 VIEW COMPARISON:  None. FINDINGS: The heart size and mediastinal contours are within normal limits. No pneumothorax or pleural effusion is noted. Right lung is clear. Density is seen in left lower lobe which may represent either pleural-based mass or possibly expansile lesion within left posterior rib. The visualized skeletal structures are unremarkable. IMPRESSION: Left lower lobe density is noted representing pleural-based mass or expansile lesion within left posterior rib. CT scan of the chest is recommended to evaluate for possible neoplasm or malignancy. Electronically Signed   By: Marijo Conception, M.D.   On: 05/10/2016 13:08   Dg Lumbar Spine 2-3 Views  05/10/2016  CLINICAL DATA:  Lower lumbar pain radiating down the left leg. Injury in February 2017. EXAM: LUMBAR SPINE - 2-3 VIEW COMPARISON:  None. FINDINGS: Transitional S1 vertebra. Abnormal lucency in the left L5 vertebra appreciable on the frontal projection, potentially with some slight compression in this vicinity, but with lucency out of proportion to the compression. Irregular margins of the cortex of the S1 vertebra on the lateral projection. Anterior wedge  compression fracture at L3 with 30% loss of vertebral body height. Aortoiliac atherosclerotic vascular disease. IMPRESSION: 1. Compression fractures at L3 and L5, with abnormal lucency in the left side of the L5 vertebral body raising concern for multiple myeloma or lytic metastatic disease from a radiographic standpoint. MRI could be utilized for further characterization if clinically warranted. 2. There is also cortical thickening and irregularity of the S1 vertebra, underlying malignancy not excluded at S 1. 3. 30% compression fracture at L 3, age indeterminate. 4. Lumbar spondylosis. 5.  Aortoiliac atherosclerotic vascular disease. Electronically Signed   By: Van Clines M.D.   On: 05/10/2016 16:48   US Renal  05/10/2016  CLINICAL DATA:  Acute kidney injury. EXAM: RENAL / URINARY TRACT ULTRASOUND COMPLETE COMPARISON:  None. FINDINGS: Right Kidney: Length: 11.4 cm. Echogenicity within normal limits. No mass or hydronephrosis visualized. Left Kidney: Length: 11.6 cm. Echogenicity within normal limits. There is a 2.3 x 2.1 x 2.1 cm cyst in the upper pole. No fall with mass or hydronephrosis visualized. Bladder: Appears normal for degree of bladder distention. IMPRESSION: 1. Left renal cyst.  Otherwise unremarkable renal ultrasound. 2. No obstructive uropathy. Electronically Signed   By: Jeb Levering M.D.   On: 05/10/2016 04:29   Ir Fluoro Guide Cv Line Right  05/12/2016  INDICATION: 70 year old with renal failure.  Catheter needed for hemodialysis. EXAM: FLUOROSCOPIC AND ULTRASOUND GUIDED PLACEMENT OF A TUNNELED DIALYSIS CATHETER Physician: Stephan Minister. Anselm Pancoast, MD MEDICATIONS: Vancomycin 1 g; The antibiotic was administered within an appropriate time interval prior to skin puncture. ANESTHESIA/SEDATION: Versed 1.5 mg IV; Fentanyl 50 mcg IV; Moderate Sedation Time:  34 The patient was continuously monitored during the procedure by the interventional radiology nurse under my direct supervision. FLUOROSCOPY  TIME:  Fluoroscopy Time: 36 seconds, 4 mGy COMPLICATIONS: None immediate. PROCEDURE: Informed consent was obtained for placement of a tunneled dialysis catheter. The patient was placed supine on the interventional table. Ultrasound confirmed a patent right internal jugular vein. Ultrasound images were obtained for documentation. The right side of the neck was prepped and draped in a sterile fashion. The right side of the neck was anesthetized with 1% lidocaine. Maximal barrier sterile technique was utilized including caps, mask, sterile  gowns, sterile gloves, sterile drape, hand hygiene and skin antiseptic. A small incision was made with #11 blade scalpel. A 21 gauge needle directed into the right internal jugular vein with ultrasound guidance. A micropuncture dilator set was placed. A 19 cm tip to cuff Palindrome catheter was selected. The skin below the right clavicle was anesthetized and a small incision was made with an #11 blade scalpel. A subcutaneous tunnel was formed to the vein dermatotomy site. The catheter was brought through the tunnel. The vein dermatotomy site was dilated to accommodate a peel-away sheath. The catheter was placed through the peel-away sheath and directed into the central venous structures. The tip of the catheter was placed at the superior cavoatrial junction with fluoroscopy. Fluoroscopic images were obtained for documentation. Both lumens were found to aspirate and flush well. The proper amount of heparin was flushed in both lumens. The vein dermatotomy site was closed using a single layer of absorbable suture and Dermabond. The catheter was secured to the skin using Prolene suture. Fluoroscopic and ultrasound images were taken and saved for documentation. IMPRESSION: Successful placement of a right jugular tunneled dialysis catheter using ultrasound and fluoroscopic guidance. Electronically Signed   By: Markus Daft M.D.   On: 05/12/2016 06:47   Ir US Guide Vasc Access  Right  05/12/2016  INDICATION: 70 year old with renal failure.  Catheter needed for hemodialysis. EXAM: FLUOROSCOPIC AND ULTRASOUND GUIDED PLACEMENT OF A TUNNELED DIALYSIS CATHETER Physician: Stephan Minister. Anselm Pancoast, MD MEDICATIONS: Vancomycin 1 g; The antibiotic was administered within an appropriate time interval prior to skin puncture. ANESTHESIA/SEDATION: Versed 1.5 mg IV; Fentanyl 50 mcg IV; Moderate Sedation Time:  34 The patient was continuously monitored during the procedure by the interventional radiology nurse under my direct supervision. FLUOROSCOPY TIME:  Fluoroscopy Time: 36 seconds, 4 mGy COMPLICATIONS: None immediate. PROCEDURE: Informed consent was obtained for placement of a tunneled dialysis catheter. The patient was placed supine on the interventional table. Ultrasound confirmed a patent right internal jugular vein. Ultrasound images were obtained for documentation. The right side of the neck was prepped and draped in a sterile fashion. The right side of the neck was anesthetized with 1% lidocaine. Maximal barrier sterile technique was utilized including caps, mask, sterile gowns, sterile gloves, sterile drape, hand hygiene and skin antiseptic. A small incision was made with #11 blade scalpel. A 21 gauge needle directed into the right internal jugular vein with ultrasound guidance. A micropuncture dilator set was placed. A 19 cm tip to cuff Palindrome catheter was selected. The skin below the right clavicle was anesthetized and a small incision was made with an #11 blade scalpel. A subcutaneous tunnel was formed to the vein dermatotomy site. The catheter was brought through the tunnel. The vein dermatotomy site was dilated to accommodate a peel-away sheath. The catheter was placed through the peel-away sheath and directed into the central venous structures. The tip of the catheter was placed at the superior cavoatrial junction with fluoroscopy. Fluoroscopic images were obtained for documentation. Both lumens  were found to aspirate and flush well. The proper amount of heparin was flushed in both lumens. The vein dermatotomy site was closed using a single layer of absorbable suture and Dermabond. The catheter was secured to the skin using Prolene suture. Fluoroscopic and ultrasound images were taken and saved for documentation. IMPRESSION: Successful placement of a right jugular tunneled dialysis catheter using ultrasound and fluoroscopic guidance. Electronically Signed   By: Markus Daft M.D.   On: 05/12/2016 06:47   Ir  US Guide Bx Asp/drain  05/12/2016  INDICATION: 70 year old with renal failure.  Request for renal biopsy. EXAM: ULTRASOUND-GUIDED RENAL BIOPSY MEDICATIONS: None. ANESTHESIA/SEDATION: Patient was monitored by radiology nurse throughout the procedure. Sedation was continued from the prior hemodialysis catheter placement. FLUOROSCOPY TIME:  None COMPLICATIONS: None immediate. PROCEDURE: Informed written consent was obtained from the patient after a thorough discussion of the procedural risks, benefits and alternatives. All questions were addressed. Maximal Sterile Barrier Technique was utilized including caps, mask, sterile gowns, sterile gloves, sterile drape, hand hygiene and skin antiseptic. A timeout was performed prior to the initiation of the procedure. Patient was placed prone. Both kidneys were evaluated with ultrasound. The right kidney was selected for biopsy. The right flank was prepped with chlorhexidine and a sterile field was created. Skin was anesthetized with 1% lidocaine. Using ultrasound guidance, 16 gauge core biopsy was obtained from the right kidney lower pole. Specimen placed in saline. A second ultrasound-guided core biopsy was obtained from the lower pole. Specimen was placed in saline. These samples were felt to be adequate. Bandage placed over the puncture site. FINDINGS: Two core biopsies obtained from the right kidney lower pole. Small perinephric hematoma identified following the  procedure. This hematoma did not appear to be expanding based on post biopsy visualization. IMPRESSION: Ultrasound-guided core biopsies of the right kidney lower pole. Electronically Signed   By: Markus Daft M.D.   On: 05/12/2016 06:54   Lab Results  Component Value Date   WBC 3.9* 05/11/2016   HGB 7.8* 05/12/2016   HCT 23.1* 05/12/2016   MCV 87.7 05/11/2016   PLT 64* 05/11/2016   Lab Results  Component Value Date   CREATININE 13.22* 05/12/2016   CREATININE 12.25* 05/11/2016   CREATININE 10.68* 05/10/2016    ASSESSMENT & PLAN:  Pancytopenia Acute renal failure Paraproteinemia, lytic lesions and elevate the light chains highly suspicious for multiple myeloma Severe back pain with lytic lesions in the lumbar spine along with compression fractures  I had a long discussion with the patient and multiple family members. He has multiple signs and symptoms highly suspicious for diagnosis of multiple myeloma Workup in progress I recommend MRI spine to exclude high risk disease that could cause spinal cord compromise I recommend skeletal survey to exclude high risk lesions that might put him at risk of bone fractures I agree to give him blood transfusion to keep hemoglobin greater than 7 I agree that he should stay in [facility] for hemod[facility] He would need bone marrow biopsy to confirm diagnosis. The patient wants to be sedated for bone marrow biopsy and I will consult IR for CT-guided biopsy next Monday if possible Once biopsy is obtained, he may be started on high-dose dexamethasone I will return next week on 05/17/2016 around 1 PM for family meeting and review all test results Plan of care is fully discussed with the primary service I addressed all questions and concerns     [facility], Zaccary Creech, MD 05/12/2016 4:33 PM

## 2016-05-12 NOTE — Progress Notes (Signed)
PROGRESS NOTE                                                                                                                                                                                                             Patient Demographics:    Thomas [name], is a 70 y.o. male, DOB - [birth year], YME:[id number]  Admit date - 05/09/2016   Admitting Physician Etta Quill, DO  Outpatient Primary MD for the patient is No PCP Per Patient  LOS - 2  Outpatient Specialists: none  Chief Complaint  Patient presents with  . low hgb        Brief Narrative   70 year old male with no significant medical history (had not seen a physician in many years) presented with about 1 month of nausea with daily vomiting. Also reports poor by mouth intake and subjective weight loss. Denied any diarrhea, hematemesis or bleeding per rectum. Patient went to the urgent care and was found to have a hemoglobin of 9.8 and sent to the ED. In the ED blood work showed creatinine >10 and BUN of 40. He was taking high dose of NSAIDs about 3 months back for severe low back pain, prescribed by his chiropractor. Reports that he told them for less than 2 weeks. He was also found to have severe hyperkalemia and given insulin with dextrose. Admitted to hospitalist service for further management.  Lab work showing hyperproteinemia, abnormal lucency of lumbar vertebrae and multiple radiolucencies in the skull x-ray indicated towards multiple myeloma.    Subjective:   C/o soreness over HD catheter site. Received 1 u PRBC yesterday   Assessment  & Plan :    Principal Problem:  Acute versus acute on chronic kidney disease UA showed proteinuria and WBC with hyaline cast. Also has high serum protein level. Ultrasound abdomen shows normal size kidneys.  Concern for AKI due  to myeloma with / without AIN from high dose NSAIDs use. -ANA and complements, complements, ANCA  and hepatitis panel negative. -Pending SPEP, light chains and  IFE. -Renal biopsy results pending. HD catheter placed to start HD today.   Active Problems: Anemia with thrombocytopenia. Iron panel suggestive of chronic disease however fecal Hemoccult positive. Monitor closely. Will need GI evaluation once acute workup completed. Progressive thrombocytopenia. Platelet function assay is high. Avoid heparin products. ESA per renal. transfuse  as needed. Received PRBC on 6/1. Possibly marrow infiltration from acute myeloma.. Will need BM bx. Dr Alvy Bimler consulted.   Vitamin B12 deficiency Significantly low levels (106). Added supplements.   Hyperkalemia Improved after IV insulin and dextrose. Starting dialysis today    Malnutrition of moderate degree        Code Status : Full code  Family Communication  : Wife and daughter at bedside  Disposition Plan  : Currently inpatient.  Barriers For Discharge : Acute kidney injury requiring RRT. Workup for multiple myeloma.  Consults  :  Nephrology (Dr. Boysie Footman) IR Oncology (Dr. Alvy Bimler)   Procedures  :  Renal ultrasound Dialysis catheter placement Left Renal biopsy  DVT Prophylaxis  :  SCDs   Lab Results  Component Value Date   PLT 64* 05/11/2016    Antibiotics  :  None  Anti-infectives    Start     Dose/Rate Route Frequency Ordered Stop   05/11/16 1225  vancomycin (VANCOCIN) 1-5 GM/200ML-% IVPB  Status:  Discontinued    CommentsKarlton Lemon, Whitney   : cabinet override      05/11/16 1225 05/11/16 1234   05/11/16 1100  vancomycin (VANCOCIN) IVPB 1000 mg/200 mL premix     1,000 mg 200 mL/hr over 60 Minutes Intravenous To Radiology 05/11/16 0911 05/11/16 1354        Objective:   Filed Vitals:   05/12/16 0200 05/12/16 0223 05/12/16 0445 05/12/16 0827  BP: 128/60 125/61 126/64 130/62  Pulse: 87 84 85 81  Temp: 98 F (36.7 C) 98 F (36.7 C) 97.5 F (36.4 C) 98.4 F (36.9 C)  TempSrc: Oral Oral Oral Oral  Resp:     17  Height:      Weight:      SpO2: 98% 99%  99%    Wt Readings from Last 3 Encounters:  05/11/16 72.848 kg (160 lb 9.6 oz)     Intake/Output Summary (Last 24 hours) at 05/12/16 1437 Last data filed at 05/12/16 1105  Gross per 24 hour  Intake   1164 ml  Output      0 ml  Net   1164 ml     Physical Exam  Gen: not in distress,  HEENT: Pallor present, moist mucosa, supple neck Chest: clear b/l, no added sounds, Right-sided dialysis catheter CVS: N S1&S2, no murmurs, rubs or gallop GI: soft, NT, ND, BS+, left renal biopsy site appears clean. Musculoskeletal: warm, no edema CNS: Alert and oriented    Data Review:    CBC  Recent Labs Lab 05/09/16 2144 05/10/16 0247 05/10/16 1156 05/11/16 0919 05/11/16 2030 05/12/16 0659  WBC 6.2  --   --  4.3 3.9*  --   HGB 9.2* 9.5*  --  7.5* 6.9* 7.8*  HCT 26.5* 28.0* 22.8* 22.1* 20.6* 23.1*  PLT 105*  --   --  77* 64*  --   MCV 85.8  --   --  86.3 87.7  --   MCH 29.8  --   --  29.3 29.4  --   MCHC 34.7  --   --  33.9 33.5  --   RDW 12.3  --   --  12.2 12.4  --   LYMPHSABS 1.1  --   --   --   --   --   MONOABS 0.6  --   --   --   --   --   EOSABS 0.1  --   --   --   --   --  BASOSABS 0.0  --   --   --   --   --     Chemistries   Recent Labs Lab 05/10/16 0247 05/10/16 0310 05/10/16 0318 05/11/16 0740 05/12/16 0913  NA 135  --  133* 132* 129*  K 6.7*  --  5.2* 5.1 5.3*  CL 106  --  102 102 100*  CO2  --   --  18* 20* 17*  GLUCOSE 157*  --  120* 85 111*  BUN 61*  --  43* 47* 53*  CREATININE 10.50*  --  10.68* 12.25* 13.22*  CALCIUM  --   --  8.7* 8.5* 8.2*  MG  --  1.9  --   --   --   AST  --  13*  --   --   --   ALT  --  9*  --   --   --   ALKPHOS  --  72  --   --   --   BILITOT  --  0.4  --   --   --    ------------------------------------------------------------------------------------------------------------------ No results for input(s): CHOL, HDL, LDLCALC, TRIG, CHOLHDL, LDLDIRECT in the last 72  hours.  No results found for: HGBA1C ------------------------------------------------------------------------------------------------------------------ No results for input(s): TSH, T4TOTAL, T3FREE, THYROIDAB in the last 72 hours.  Invalid input(s): FREET3 ------------------------------------------------------------------------------------------------------------------  Recent Labs  05/10/16 1156  VITAMINB12 106*  TIBC 167*  IRON 64    Coagulation profile  Recent Labs Lab 05/10/16 1156 05/11/16 0500  INR 1.40 1.46    No results for input(s): DDIMER in the last 72 hours.  Cardiac Enzymes No results for input(s): CKMB, TROPONINI, MYOGLOBIN in the last 168 hours.  Invalid input(s): CK ------------------------------------------------------------------------------------------------------------------ No results found for: BNP  Inpatient Medications  Scheduled Meds: . sodium chloride   Intravenous Once  . darbepoetin (ARANESP) injection - DIALYSIS  200 mcg Intravenous Q Fri-HD  . feeding supplement (NEPRO CARB STEADY)  237 mL Oral BID BM  . vitamin B-12  1,000 mcg Oral Daily   Continuous Infusions:  PRN Meds:.  Micro Results No results found for this or any previous visit (from the past 240 hour(s)).  Radiology Reports Dg Skull 1-3 Views  05/11/2016  CLINICAL DATA:  Myeloma EXAM: SKULL - 1-3 VIEW COMPARISON:  None. FINDINGS: Frontal view shows 13 mm lucency in the parasagittal left skull. On the lateral view approximately 10 round or oval lucencies are identified, the largest measuring 14 mm in maximal diameter. There are also lucencies in both mandibles. IMPRESSION: Multiple radiolucencies consistent with myeloma. Electronically Signed   By: Skipper Cliche M.D.   On: 05/11/2016 10:42   Dg Chest 2 View  05/10/2016  CLINICAL DATA:  Congestive heart failure. EXAM: CHEST  2 VIEW COMPARISON:  None. FINDINGS: The heart size and mediastinal contours are within normal limits.  No pneumothorax or pleural effusion is noted. Right lung is clear. Density is seen in left lower lobe which may represent either pleural-based mass or possibly expansile lesion within left posterior rib. The visualized skeletal structures are unremarkable. IMPRESSION: Left lower lobe density is noted representing pleural-based mass or expansile lesion within left posterior rib. CT scan of the chest is recommended to evaluate for possible neoplasm or malignancy. Electronically Signed   By: Marijo Conception, M.D.   On: 05/10/2016 13:08   Dg Lumbar Spine 2-3 Views  05/10/2016  CLINICAL DATA:  Lower lumbar pain radiating down the left leg. Injury in February 2017. EXAM: LUMBAR  SPINE - 2-3 VIEW COMPARISON:  None. FINDINGS: Transitional S1 vertebra. Abnormal lucency in the left L5 vertebra appreciable on the frontal projection, potentially with some slight compression in this vicinity, but with lucency out of proportion to the compression. Irregular margins of the cortex of the S1 vertebra on the lateral projection. Anterior wedge compression fracture at L3 with 30% loss of vertebral body height. Aortoiliac atherosclerotic vascular disease. IMPRESSION: 1. Compression fractures at L3 and L5, with abnormal lucency in the left side of the L5 vertebral body raising concern for multiple myeloma or lytic metastatic disease from a radiographic standpoint. MRI could be utilized for further characterization if clinically warranted. 2. There is also cortical thickening and irregularity of the S1 vertebra, underlying malignancy not excluded at S 1. 3. 30% compression fracture at L 3, age indeterminate. 4. Lumbar spondylosis. 5.  Aortoiliac atherosclerotic vascular disease. Electronically Signed   By: Van Clines M.D.   On: 05/10/2016 16:48   US Renal  05/10/2016  CLINICAL DATA:  Acute kidney injury. EXAM: RENAL / URINARY TRACT ULTRASOUND COMPLETE COMPARISON:  None. FINDINGS: Right Kidney: Length: 11.4 cm. Echogenicity  within normal limits. No mass or hydronephrosis visualized. Left Kidney: Length: 11.6 cm. Echogenicity within normal limits. There is a 2.3 x 2.1 x 2.1 cm cyst in the upper pole. No fall with mass or hydronephrosis visualized. Bladder: Appears normal for degree of bladder distention. IMPRESSION: 1. Left renal cyst.  Otherwise unremarkable renal ultrasound. 2. No obstructive uropathy. Electronically Signed   By: Jeb Levering M.D.   On: 05/10/2016 04:29   Ir Fluoro Guide Cv Line Right  05/12/2016  INDICATION: 70 year old with renal failure.  Catheter needed for hemodialysis. EXAM: FLUOROSCOPIC AND ULTRASOUND GUIDED PLACEMENT OF A TUNNELED DIALYSIS CATHETER Physician: Stephan Minister. Anselm Pancoast, MD MEDICATIONS: Vancomycin 1 g; The antibiotic was administered within an appropriate time interval prior to skin puncture. ANESTHESIA/SEDATION: Versed 1.5 mg IV; Fentanyl 50 mcg IV; Moderate Sedation Time:  34 The patient was continuously monitored during the procedure by the interventional radiology nurse under my direct supervision. FLUOROSCOPY TIME:  Fluoroscopy Time: 36 seconds, 4 mGy COMPLICATIONS: None immediate. PROCEDURE: Informed consent was obtained for placement of a tunneled dialysis catheter. The patient was placed supine on the interventional table. Ultrasound confirmed a patent right internal jugular vein. Ultrasound images were obtained for documentation. The right side of the neck was prepped and draped in a sterile fashion. The right side of the neck was anesthetized with 1% lidocaine. Maximal barrier sterile technique was utilized including caps, mask, sterile gowns, sterile gloves, sterile drape, hand hygiene and skin antiseptic. A small incision was made with #11 blade scalpel. A 21 gauge needle directed into the right internal jugular vein with ultrasound guidance. A micropuncture dilator set was placed. A 19 cm tip to cuff Palindrome catheter was selected. The skin below the right clavicle was anesthetized and a  small incision was made with an #11 blade scalpel. A subcutaneous tunnel was formed to the vein dermatotomy site. The catheter was brought through the tunnel. The vein dermatotomy site was dilated to accommodate a peel-away sheath. The catheter was placed through the peel-away sheath and directed into the central venous structures. The tip of the catheter was placed at the superior cavoatrial junction with fluoroscopy. Fluoroscopic images were obtained for documentation. Both lumens were found to aspirate and flush well. The proper amount of heparin was flushed in both lumens. The vein dermatotomy site was closed using a single layer of absorbable suture  and Dermabond. The catheter was secured to the skin using Prolene suture. Fluoroscopic and ultrasound images were taken and saved for documentation. IMPRESSION: Successful placement of a right jugular tunneled dialysis catheter using ultrasound and fluoroscopic guidance. Electronically Signed   By: Markus Daft M.D.   On: 05/12/2016 06:47   Ir US Guide Vasc Access Right  05/12/2016  INDICATION: 70 year old with renal failure.  Catheter needed for hemodialysis. EXAM: FLUOROSCOPIC AND ULTRASOUND GUIDED PLACEMENT OF A TUNNELED DIALYSIS CATHETER Physician: Stephan Minister. Anselm Pancoast, MD MEDICATIONS: Vancomycin 1 g; The antibiotic was administered within an appropriate time interval prior to skin puncture. ANESTHESIA/SEDATION: Versed 1.5 mg IV; Fentanyl 50 mcg IV; Moderate Sedation Time:  34 The patient was continuously monitored during the procedure by the interventional radiology nurse under my direct supervision. FLUOROSCOPY TIME:  Fluoroscopy Time: 36 seconds, 4 mGy COMPLICATIONS: None immediate. PROCEDURE: Informed consent was obtained for placement of a tunneled dialysis catheter. The patient was placed supine on the interventional table. Ultrasound confirmed a patent right internal jugular vein. Ultrasound images were obtained for documentation. The right side of the neck was  prepped and draped in a sterile fashion. The right side of the neck was anesthetized with 1% lidocaine. Maximal barrier sterile technique was utilized including caps, mask, sterile gowns, sterile gloves, sterile drape, hand hygiene and skin antiseptic. A small incision was made with #11 blade scalpel. A 21 gauge needle directed into the right internal jugular vein with ultrasound guidance. A micropuncture dilator set was placed. A 19 cm tip to cuff Palindrome catheter was selected. The skin below the right clavicle was anesthetized and a small incision was made with an #11 blade scalpel. A subcutaneous tunnel was formed to the vein dermatotomy site. The catheter was brought through the tunnel. The vein dermatotomy site was dilated to accommodate a peel-away sheath. The catheter was placed through the peel-away sheath and directed into the central venous structures. The tip of the catheter was placed at the superior cavoatrial junction with fluoroscopy. Fluoroscopic images were obtained for documentation. Both lumens were found to aspirate and flush well. The proper amount of heparin was flushed in both lumens. The vein dermatotomy site was closed using a single layer of absorbable suture and Dermabond. The catheter was secured to the skin using Prolene suture. Fluoroscopic and ultrasound images were taken and saved for documentation. IMPRESSION: Successful placement of a right jugular tunneled dialysis catheter using ultrasound and fluoroscopic guidance. Electronically Signed   By: Markus Daft M.D.   On: 05/12/2016 06:47   Ir US Guide Bx Asp/drain  05/12/2016  INDICATION: 70 year old with renal failure.  Request for renal biopsy. EXAM: ULTRASOUND-GUIDED RENAL BIOPSY MEDICATIONS: None. ANESTHESIA/SEDATION: Patient was monitored by radiology nurse throughout the procedure. Sedation was continued from the prior hemodialysis catheter placement. FLUOROSCOPY TIME:  None COMPLICATIONS: None immediate. PROCEDURE: Informed  written consent was obtained from the patient after a thorough discussion of the procedural risks, benefits and alternatives. All questions were addressed. Maximal Sterile Barrier Technique was utilized including caps, mask, sterile gowns, sterile gloves, sterile drape, hand hygiene and skin antiseptic. A timeout was performed prior to the initiation of the procedure. Patient was placed prone. Both kidneys were evaluated with ultrasound. The right kidney was selected for biopsy. The right flank was prepped with chlorhexidine and a sterile field was created. Skin was anesthetized with 1% lidocaine. Using ultrasound guidance, 16 gauge core biopsy was obtained from the right kidney lower pole. Specimen placed in saline. A second ultrasound-guided core  biopsy was obtained from the lower pole. Specimen was placed in saline. These samples were felt to be adequate. Bandage placed over the puncture site. FINDINGS: Two core biopsies obtained from the right kidney lower pole. Small perinephric hematoma identified following the procedure. This hematoma did not appear to be expanding based on post biopsy visualization. IMPRESSION: Ultrasound-guided core biopsies of the right kidney lower pole. Electronically Signed   By: Markus Daft M.D.   On: 05/12/2016 06:54    Time Spent in minutes  25   Louellen Molder M.D on 05/12/2016 at 2:37 PM  Between 7am to 7pm - Pager - [phone]  After 7pm go to [email or web address] - password [facility]  [facility] -  Office  [phone]

## 2016-05-12 NOTE — Progress Notes (Signed)
Referring Physician(s): Dr Jeneen Rinks Deterding  Supervising Physician: Corrie Mckusick  Patient Status: In-pt  Chief Complaint:  Tunneled HD catheter and Renal bx performed in IR 6/1   Subjective:  Did receive transfusion last pm Hgb 7.8 this am Feels great this am Soreness at HD sit Eating reg diet  Allergies: Codeine and Penicillins  Medications: Prior to Admission medications   Not on File     Vital Signs: BP 130/62 mmHg  Pulse 81  Temp(Src) 98.4 F (36.9 C) (Oral)  Resp 17  Ht 6' 1"  (1.854 m)  Wt 160 lb 9.6 oz (72.848 kg)  BMI 21.19 kg/m2  SpO2 99%  Physical Exam  Constitutional: He is oriented to person, place, and time.  Cardiovascular: Normal rate and regular rhythm.   Musculoskeletal: Normal range of motion.  Neurological: He is alert and oriented to person, place, and time.  Skin: Skin is warm and dry.  Site of HD cath clean and dry; no hematoma Site of Random renal bx clean and dry NT; no bleeding  Psychiatric: He has a normal mood and affect. His behavior is normal. Judgment and thought content normal.  Nursing note and vitals reviewed.   Imaging: Dg Skull 1-3 Views  05/11/2016  CLINICAL DATA:  Myeloma EXAM: SKULL - 1-3 VIEW COMPARISON:  None. FINDINGS: Frontal view shows 13 mm lucency in the parasagittal left skull. On the lateral view approximately 10 round or oval lucencies are identified, the largest measuring 14 mm in maximal diameter. There are also lucencies in both mandibles. IMPRESSION: Multiple radiolucencies consistent with myeloma. Electronically Signed   By: Skipper Cliche M.D.   On: 05/11/2016 10:42   Dg Chest 2 View  05/10/2016  CLINICAL DATA:  Congestive heart failure. EXAM: CHEST  2 VIEW COMPARISON:  None. FINDINGS: The heart size and mediastinal contours are within normal limits. No pneumothorax or pleural effusion is noted. Right lung is clear. Density is seen in left lower lobe which may represent either pleural-based mass or  possibly expansile lesion within left posterior rib. The visualized skeletal structures are unremarkable. IMPRESSION: Left lower lobe density is noted representing pleural-based mass or expansile lesion within left posterior rib. CT scan of the chest is recommended to evaluate for possible neoplasm or malignancy. Electronically Signed   By: Marijo Conception, M.D.   On: 05/10/2016 13:08   Dg Lumbar Spine 2-3 Views  05/10/2016  CLINICAL DATA:  Lower lumbar pain radiating down the left leg. Injury in February 2017. EXAM: LUMBAR SPINE - 2-3 VIEW COMPARISON:  None. FINDINGS: Transitional S1 vertebra. Abnormal lucency in the left L5 vertebra appreciable on the frontal projection, potentially with some slight compression in this vicinity, but with lucency out of proportion to the compression. Irregular margins of the cortex of the S1 vertebra on the lateral projection. Anterior wedge compression fracture at L3 with 30% loss of vertebral body height. Aortoiliac atherosclerotic vascular disease. IMPRESSION: 1. Compression fractures at L3 and L5, with abnormal lucency in the left side of the L5 vertebral body raising concern for multiple myeloma or lytic metastatic disease from a radiographic standpoint. MRI could be utilized for further characterization if clinically warranted. 2. There is also cortical thickening and irregularity of the S1 vertebra, underlying malignancy not excluded at S 1. 3. 30% compression fracture at L 3, age indeterminate. 4. Lumbar spondylosis. 5.  Aortoiliac atherosclerotic vascular disease. Electronically Signed   By: Van Clines M.D.   On: 05/10/2016 16:48   US Renal  05/10/2016  CLINICAL DATA:  Acute kidney injury. EXAM: RENAL / URINARY TRACT ULTRASOUND COMPLETE COMPARISON:  None. FINDINGS: Right Kidney: Length: 11.4 cm. Echogenicity within normal limits. No mass or hydronephrosis visualized. Left Kidney: Length: 11.6 cm. Echogenicity within normal limits. There is a 2.3 x 2.1 x 2.1  cm cyst in the upper pole. No fall with mass or hydronephrosis visualized. Bladder: Appears normal for degree of bladder distention. IMPRESSION: 1. Left renal cyst.  Otherwise unremarkable renal ultrasound. 2. No obstructive uropathy. Electronically Signed   By: Jeb Levering M.D.   On: 05/10/2016 04:29   Ir Fluoro Guide Cv Line Right  05/12/2016  INDICATION: 70 year old with renal failure.  Catheter needed for hemodialysis. EXAM: FLUOROSCOPIC AND ULTRASOUND GUIDED PLACEMENT OF A TUNNELED DIALYSIS CATHETER Physician: Stephan Minister. Anselm Pancoast, MD MEDICATIONS: Vancomycin 1 g; The antibiotic was administered within an appropriate time interval prior to skin puncture. ANESTHESIA/SEDATION: Versed 1.5 mg IV; Fentanyl 50 mcg IV; Moderate Sedation Time:  34 The patient was continuously monitored during the procedure by the interventional radiology nurse under my direct supervision. FLUOROSCOPY TIME:  Fluoroscopy Time: 36 seconds, 4 mGy COMPLICATIONS: None immediate. PROCEDURE: Informed consent was obtained for placement of a tunneled dialysis catheter. The patient was placed supine on the interventional table. Ultrasound confirmed a patent right internal jugular vein. Ultrasound images were obtained for documentation. The right side of the neck was prepped and draped in a sterile fashion. The right side of the neck was anesthetized with 1% lidocaine. Maximal barrier sterile technique was utilized including caps, mask, sterile gowns, sterile gloves, sterile drape, hand hygiene and skin antiseptic. A small incision was made with #11 blade scalpel. A 21 gauge needle directed into the right internal jugular vein with ultrasound guidance. A micropuncture dilator set was placed. A 19 cm tip to cuff Palindrome catheter was selected. The skin below the right clavicle was anesthetized and a small incision was made with an #11 blade scalpel. A subcutaneous tunnel was formed to the vein dermatotomy site. The catheter was brought through the  tunnel. The vein dermatotomy site was dilated to accommodate a peel-away sheath. The catheter was placed through the peel-away sheath and directed into the central venous structures. The tip of the catheter was placed at the superior cavoatrial junction with fluoroscopy. Fluoroscopic images were obtained for documentation. Both lumens were found to aspirate and flush well. The proper amount of heparin was flushed in both lumens. The vein dermatotomy site was closed using a single layer of absorbable suture and Dermabond. The catheter was secured to the skin using Prolene suture. Fluoroscopic and ultrasound images were taken and saved for documentation. IMPRESSION: Successful placement of a right jugular tunneled dialysis catheter using ultrasound and fluoroscopic guidance. Electronically Signed   By: Markus Daft M.D.   On: 05/12/2016 06:47   Ir US Guide Vasc Access Right  05/12/2016  INDICATION: 70 year old with renal failure.  Catheter needed for hemodialysis. EXAM: FLUOROSCOPIC AND ULTRASOUND GUIDED PLACEMENT OF A TUNNELED DIALYSIS CATHETER Physician: Stephan Minister. Anselm Pancoast, MD MEDICATIONS: Vancomycin 1 g; The antibiotic was administered within an appropriate time interval prior to skin puncture. ANESTHESIA/SEDATION: Versed 1.5 mg IV; Fentanyl 50 mcg IV; Moderate Sedation Time:  34 The patient was continuously monitored during the procedure by the interventional radiology nurse under my direct supervision. FLUOROSCOPY TIME:  Fluoroscopy Time: 36 seconds, 4 mGy COMPLICATIONS: None immediate. PROCEDURE: Informed consent was obtained for placement of a tunneled dialysis catheter. The patient was placed supine on the interventional table.  Ultrasound confirmed a patent right internal jugular vein. Ultrasound images were obtained for documentation. The right side of the neck was prepped and draped in a sterile fashion. The right side of the neck was anesthetized with 1% lidocaine. Maximal barrier sterile technique was utilized  including caps, mask, sterile gowns, sterile gloves, sterile drape, hand hygiene and skin antiseptic. A small incision was made with #11 blade scalpel. A 21 gauge needle directed into the right internal jugular vein with ultrasound guidance. A micropuncture dilator set was placed. A 19 cm tip to cuff Palindrome catheter was selected. The skin below the right clavicle was anesthetized and a small incision was made with an #11 blade scalpel. A subcutaneous tunnel was formed to the vein dermatotomy site. The catheter was brought through the tunnel. The vein dermatotomy site was dilated to accommodate a peel-away sheath. The catheter was placed through the peel-away sheath and directed into the central venous structures. The tip of the catheter was placed at the superior cavoatrial junction with fluoroscopy. Fluoroscopic images were obtained for documentation. Both lumens were found to aspirate and flush well. The proper amount of heparin was flushed in both lumens. The vein dermatotomy site was closed using a single layer of absorbable suture and Dermabond. The catheter was secured to the skin using Prolene suture. Fluoroscopic and ultrasound images were taken and saved for documentation. IMPRESSION: Successful placement of a right jugular tunneled dialysis catheter using ultrasound and fluoroscopic guidance. Electronically Signed   By: Markus Daft M.D.   On: 05/12/2016 06:47   Ir US Guide Bx Asp/drain  05/12/2016  INDICATION: 70 year old with renal failure.  Request for renal biopsy. EXAM: ULTRASOUND-GUIDED RENAL BIOPSY MEDICATIONS: None. ANESTHESIA/SEDATION: Patient was monitored by radiology nurse throughout the procedure. Sedation was continued from the prior hemodialysis catheter placement. FLUOROSCOPY TIME:  None COMPLICATIONS: None immediate. PROCEDURE: Informed written consent was obtained from the patient after a thorough discussion of the procedural risks, benefits and alternatives. All questions were  addressed. Maximal Sterile Barrier Technique was utilized including caps, mask, sterile gowns, sterile gloves, sterile drape, hand hygiene and skin antiseptic. A timeout was performed prior to the initiation of the procedure. Patient was placed prone. Both kidneys were evaluated with ultrasound. The right kidney was selected for biopsy. The right flank was prepped with chlorhexidine and a sterile field was created. Skin was anesthetized with 1% lidocaine. Using ultrasound guidance, 16 gauge core biopsy was obtained from the right kidney lower pole. Specimen placed in saline. A second ultrasound-guided core biopsy was obtained from the lower pole. Specimen was placed in saline. These samples were felt to be adequate. Bandage placed over the puncture site. FINDINGS: Two core biopsies obtained from the right kidney lower pole. Small perinephric hematoma identified following the procedure. This hematoma did not appear to be expanding based on post biopsy visualization. IMPRESSION: Ultrasound-guided core biopsies of the right kidney lower pole. Electronically Signed   By: Markus Daft M.D.   On: 05/12/2016 06:54    Labs:  CBC:  Recent Labs  05/09/16 2144 05/10/16 0247 05/10/16 1156 05/11/16 0919 05/11/16 2030 05/12/16 0659  WBC 6.2  --   --  4.3 3.9*  --   HGB 9.2* 9.5*  --  7.5* 6.9* 7.8*  HCT 26.5* 28.0* 22.8* 22.1* 20.6* 23.1*  PLT 105*  --   --  77* 64*  --     COAGS:  Recent Labs  05/10/16 1156 05/11/16 0500  INR 1.40 1.46  APTT 33 31  BMP:  Recent Labs  05/10/16 0247 05/10/16 0318 05/11/16 0740  NA 135 133* 132*  K 6.7* 5.2* 5.1  CL 106 102 102  CO2  --  18* 20*  GLUCOSE 157* 120* 85  BUN 61* 43* 47*  CALCIUM  --  8.7* 8.5*  CREATININE 10.50* 10.68* 12.25*  GFRNONAA  --  4* 4*  GFRAA  --  5* 4*    LIVER FUNCTION TESTS:  Recent Labs  05/10/16 0310  BILITOT 0.4  AST 13*  ALT 9*  ALKPHOS 72  PROT 9.7*  ALBUMIN 2.8*    Assessment and Plan:  Doing well  post IR procedures 6/1  Electronically Signed: Blakeley Margraf A 05/12/2016, 9:21 AM   I spent a total of 15 Minutes at the the patient's bedside AND on the patient's hospital floor or unit, greater than 50% of which was counseling/coordinating care for HD cath and random renal bx

## 2016-05-12 NOTE — Progress Notes (Signed)
Subjective: Interval History: has no complaint of pain in abdm,  Sore at HD cath site.  Objective: Vital signs in last 24 hours: Temp:  [97.5 F (36.4 C)-98.9 F (37.2 C)] 98.4 F (36.9 C) (06/02 0827) Pulse Rate:  [81-99] 81 (06/02 0827) Resp:  [11-21] 17 (06/02 0827) BP: (109-144)/(54-69) 130/62 mmHg (06/02 0827) SpO2:  [96 %-100 %] 99 % (06/02 0827) Weight:  [72.848 kg (160 lb 9.6 oz)] 72.848 kg (160 lb 9.6 oz) (06/01 2045) Weight change: 0.454 kg (1 lb)  Intake/Output from previous day: 06/01 0701 - 06/02 0700 In: 924 [P.O.:360; I.V.:250; Blood:314] Out: 0  Intake/Output this shift:    General appearance: alert, cooperative and no distress Resp: clear to auscultation bilaterally Chest wall: RIJ PC Cardio: S1, S2 normal and systolic murmur: holosystolic 2/6, blowing at apex GI: soft, non-tender; bowel sounds normal; no masses,  no organomegaly Extremities: extremities normal, atraumatic, no cyanosis or edema  Lab Results:  Recent Labs  05/11/16 0919 05/11/16 2030 05/12/16 0659  WBC 4.3 3.9*  --   HGB 7.5* 6.9* 7.8*  HCT 22.1* 20.6* 23.1*  PLT 77* 64*  --    BMET:  Recent Labs  05/10/16 0318 05/11/16 0740  NA 133* 132*  K 5.2* 5.1  CL 102 102  CO2 18* 20*  GLUCOSE 120* 85  BUN 43* 47*  CREATININE 10.68* 12.25*  CALCIUM 8.7* 8.5*    Recent Labs  05/10/16 1156  PTH 38   Iron Studies:  Recent Labs  05/10/16 1156  IRON 64  TIBC 167*    Studies/Results: Dg Skull 1-3 Views  05/11/2016  CLINICAL DATA:  Myeloma EXAM: SKULL - 1-3 VIEW COMPARISON:  None. FINDINGS: Frontal view shows 13 mm lucency in the parasagittal left skull. On the lateral view approximately 10 round or oval lucencies are identified, the largest measuring 14 mm in maximal diameter. There are also lucencies in both mandibles. IMPRESSION: Multiple radiolucencies consistent with myeloma. Electronically Signed   By: Skipper Cliche M.D.   On: 05/11/2016 10:42   Dg Chest 2  View  05/10/2016  CLINICAL DATA:  Congestive heart failure. EXAM: CHEST  2 VIEW COMPARISON:  None. FINDINGS: The heart size and mediastinal contours are within normal limits. No pneumothorax or pleural effusion is noted. Right lung is clear. Density is seen in left lower lobe which may represent either pleural-based mass or possibly expansile lesion within left posterior rib. The visualized skeletal structures are unremarkable. IMPRESSION: Left lower lobe density is noted representing pleural-based mass or expansile lesion within left posterior rib. CT scan of the chest is recommended to evaluate for possible neoplasm or malignancy. Electronically Signed   By: Marijo Conception, M.D.   On: 05/10/2016 13:08   Dg Lumbar Spine 2-3 Views  05/10/2016  CLINICAL DATA:  Lower lumbar pain radiating down the left leg. Injury in February 2017. EXAM: LUMBAR SPINE - 2-3 VIEW COMPARISON:  None. FINDINGS: Transitional S1 vertebra. Abnormal lucency in the left L5 vertebra appreciable on the frontal projection, potentially with some slight compression in this vicinity, but with lucency out of proportion to the compression. Irregular margins of the cortex of the S1 vertebra on the lateral projection. Anterior wedge compression fracture at L3 with 30% loss of vertebral body height. Aortoiliac atherosclerotic vascular disease. IMPRESSION: 1. Compression fractures at L3 and L5, with abnormal lucency in the left side of the L5 vertebral body raising concern for multiple myeloma or lytic metastatic disease from a radiographic standpoint. MRI could  be utilized for further characterization if clinically warranted. 2. There is also cortical thickening and irregularity of the S1 vertebra, underlying malignancy not excluded at S 1. 3. 30% compression fracture at L 3, age indeterminate. 4. Lumbar spondylosis. 5.  Aortoiliac atherosclerotic vascular disease. Electronically Signed   By: Van Clines M.D.   On: 05/10/2016 16:48   Ir  Fluoro Guide Cv Line Right  05/12/2016  INDICATION: 70 year old with renal failure.  Catheter needed for hemodialysis. EXAM: FLUOROSCOPIC AND ULTRASOUND GUIDED PLACEMENT OF A TUNNELED DIALYSIS CATHETER Physician: Stephan Minister. Anselm Pancoast, MD MEDICATIONS: Vancomycin 1 g; The antibiotic was administered within an appropriate time interval prior to skin puncture. ANESTHESIA/SEDATION: Versed 1.5 mg IV; Fentanyl 50 mcg IV; Moderate Sedation Time:  34 The patient was continuously monitored during the procedure by the interventional radiology nurse under my direct supervision. FLUOROSCOPY TIME:  Fluoroscopy Time: 36 seconds, 4 mGy COMPLICATIONS: None immediate. PROCEDURE: Informed consent was obtained for placement of a tunneled dialysis catheter. The patient was placed supine on the interventional table. Ultrasound confirmed a patent right internal jugular vein. Ultrasound images were obtained for documentation. The right side of the neck was prepped and draped in a sterile fashion. The right side of the neck was anesthetized with 1% lidocaine. Maximal barrier sterile technique was utilized including caps, mask, sterile gowns, sterile gloves, sterile drape, hand hygiene and skin antiseptic. A small incision was made with #11 blade scalpel. A 21 gauge needle directed into the right internal jugular vein with ultrasound guidance. A micropuncture dilator set was placed. A 19 cm tip to cuff Palindrome catheter was selected. The skin below the right clavicle was anesthetized and a small incision was made with an #11 blade scalpel. A subcutaneous tunnel was formed to the vein dermatotomy site. The catheter was brought through the tunnel. The vein dermatotomy site was dilated to accommodate a peel-away sheath. The catheter was placed through the peel-away sheath and directed into the central venous structures. The tip of the catheter was placed at the superior cavoatrial junction with fluoroscopy. Fluoroscopic images were obtained for  documentation. Both lumens were found to aspirate and flush well. The proper amount of heparin was flushed in both lumens. The vein dermatotomy site was closed using a single layer of absorbable suture and Dermabond. The catheter was secured to the skin using Prolene suture. Fluoroscopic and ultrasound images were taken and saved for documentation. IMPRESSION: Successful placement of a right jugular tunneled dialysis catheter using ultrasound and fluoroscopic guidance. Electronically Signed   By: Markus Daft M.D.   On: 05/12/2016 06:47   Ir US Guide Vasc Access Right  05/12/2016  INDICATION: 70 year old with renal failure.  Catheter needed for hemodialysis. EXAM: FLUOROSCOPIC AND ULTRASOUND GUIDED PLACEMENT OF A TUNNELED DIALYSIS CATHETER Physician: Stephan Minister. Anselm Pancoast, MD MEDICATIONS: Vancomycin 1 g; The antibiotic was administered within an appropriate time interval prior to skin puncture. ANESTHESIA/SEDATION: Versed 1.5 mg IV; Fentanyl 50 mcg IV; Moderate Sedation Time:  34 The patient was continuously monitored during the procedure by the interventional radiology nurse under my direct supervision. FLUOROSCOPY TIME:  Fluoroscopy Time: 36 seconds, 4 mGy COMPLICATIONS: None immediate. PROCEDURE: Informed consent was obtained for placement of a tunneled dialysis catheter. The patient was placed supine on the interventional table. Ultrasound confirmed a patent right internal jugular vein. Ultrasound images were obtained for documentation. The right side of the neck was prepped and draped in a sterile fashion. The right side of the neck was anesthetized with 1% lidocaine. Maximal  barrier sterile technique was utilized including caps, mask, sterile gowns, sterile gloves, sterile drape, hand hygiene and skin antiseptic. A small incision was made with #11 blade scalpel. A 21 gauge needle directed into the right internal jugular vein with ultrasound guidance. A micropuncture dilator set was placed. A 19 cm tip to cuff  Palindrome catheter was selected. The skin below the right clavicle was anesthetized and a small incision was made with an #11 blade scalpel. A subcutaneous tunnel was formed to the vein dermatotomy site. The catheter was brought through the tunnel. The vein dermatotomy site was dilated to accommodate a peel-away sheath. The catheter was placed through the peel-away sheath and directed into the central venous structures. The tip of the catheter was placed at the superior cavoatrial junction with fluoroscopy. Fluoroscopic images were obtained for documentation. Both lumens were found to aspirate and flush well. The proper amount of heparin was flushed in both lumens. The vein dermatotomy site was closed using a single layer of absorbable suture and Dermabond. The catheter was secured to the skin using Prolene suture. Fluoroscopic and ultrasound images were taken and saved for documentation. IMPRESSION: Successful placement of a right jugular tunneled dialysis catheter using ultrasound and fluoroscopic guidance. Electronically Signed   By: Markus Daft M.D.   On: 05/12/2016 06:47   Ir US Guide Bx Asp/drain  05/12/2016  INDICATION: 70 year old with renal failure.  Request for renal biopsy. EXAM: ULTRASOUND-GUIDED RENAL BIOPSY MEDICATIONS: None. ANESTHESIA/SEDATION: Patient was monitored by radiology nurse throughout the procedure. Sedation was continued from the prior hemodialysis catheter placement. FLUOROSCOPY TIME:  None COMPLICATIONS: None immediate. PROCEDURE: Informed written consent was obtained from the patient after a thorough discussion of the procedural risks, benefits and alternatives. All questions were addressed. Maximal Sterile Barrier Technique was utilized including caps, mask, sterile gowns, sterile gloves, sterile drape, hand hygiene and skin antiseptic. A timeout was performed prior to the initiation of the procedure. Patient was placed prone. Both kidneys were evaluated with ultrasound. The right  kidney was selected for biopsy. The right flank was prepped with chlorhexidine and a sterile field was created. Skin was anesthetized with 1% lidocaine. Using ultrasound guidance, 16 gauge core biopsy was obtained from the right kidney lower pole. Specimen placed in saline. A second ultrasound-guided core biopsy was obtained from the lower pole. Specimen was placed in saline. These samples were felt to be adequate. Bandage placed over the puncture site. FINDINGS: Two core biopsies obtained from the right kidney lower pole. Small perinephric hematoma identified following the procedure. This hematoma did not appear to be expanding based on post biopsy visualization. IMPRESSION: Ultrasound-guided core biopsies of the right kidney lower pole. Electronically Signed   By: Markus Daft M.D.   On: 05/12/2016 06:54    I have reviewed the patient's current medications.  Assessment/Plan: 1 AKI  Needs HD today, will do without hep,  Check labs 2 anemia had 1 Unit tx yest.  Baseline hb low at 7.5.  Will use esa , Fe ok.  Suspect BM process 3 bone lesions suspect MM 4 Comp fx suspect secondary fx P Hd, esa, save arm, follow hb, check chem   LOS: 2 days   [place] Urieta L 05/12/2016,9:15 AM

## 2016-05-12 NOTE — Procedures (Signed)
I was present at this session.  I have reviewed the session itself and made appropriate changes.  1st HD.  bp 120s. Going slow K, mildly up, acidemic .  For HD in am. also  [place] Ryback L 6/2/20177:00 PM

## 2016-05-13 ENCOUNTER — Inpatient Hospital Stay (HOSPITAL_COMMUNITY): Payer: PPO

## 2016-05-13 DIAGNOSIS — N179 Acute kidney failure, unspecified: Secondary | ICD-10-CM | POA: Diagnosis present

## 2016-05-13 DIAGNOSIS — D619 Aplastic anemia, unspecified: Secondary | ICD-10-CM | POA: Diagnosis present

## 2016-05-13 DIAGNOSIS — C9000 Multiple myeloma not having achieved remission: Principal | ICD-10-CM

## 2016-05-13 DIAGNOSIS — M84552A Pathological fracture in neoplastic disease, left femur, initial encounter for fracture: Secondary | ICD-10-CM

## 2016-05-13 LAB — PREPARE RBC (CROSSMATCH)

## 2016-05-13 LAB — RENAL FUNCTION PANEL
Albumin: 1.9 g/dL — ABNORMAL LOW (ref 3.5–5.0)
Anion gap: 8 (ref 5–15)
BUN: 36 mg/dL — AB (ref 6–20)
CALCIUM: 8.1 mg/dL — AB (ref 8.9–10.3)
CHLORIDE: 97 mmol/L — AB (ref 101–111)
CO2: 23 mmol/L (ref 22–32)
CREATININE: 9.93 mg/dL — AB (ref 0.61–1.24)
GFR, EST AFRICAN AMERICAN: 5 mL/min — AB (ref >60)
GFR, EST NON AFRICAN AMERICAN: 5 mL/min — AB (ref >60)
Glucose, Bld: 112 mg/dL — ABNORMAL HIGH (ref 65–99)
Phosphorus: 5.1 mg/dL — ABNORMAL HIGH (ref 2.5–4.6)
Potassium: 4.4 mmol/L (ref 3.5–5.1)
SODIUM: 128 mmol/L — AB (ref 135–145)

## 2016-05-13 LAB — CBC
HCT: 22 % — ABNORMAL LOW (ref 39.0–52.0)
HEMATOCRIT: 29 % — AB (ref 39.0–52.0)
Hemoglobin: 7.5 g/dL — ABNORMAL LOW (ref 13.0–17.0)
Hemoglobin: 9.8 g/dL — ABNORMAL LOW (ref 13.0–17.0)
MCH: 29.5 pg (ref 26.0–34.0)
MCH: 28.8 pg (ref 26.0–34.0)
MCHC: 34.1 g/dL (ref 30.0–36.0)
MCHC: 33.8 g/dL (ref 30.0–36.0)
MCV: 86.6 fL (ref 78.0–100.0)
MCV: 85.3 fL (ref 78.0–100.0)
PLATELETS: 53 10*3/uL — AB (ref 150–400)
PLATELETS: 59 10*3/uL — AB (ref 150–400)
RBC: 2.54 MIL/uL — AB (ref 4.22–5.81)
RBC: 3.4 MIL/uL — AB (ref 4.22–5.81)
RDW: 12.5 % (ref 11.5–15.5)
RDW: 13.3 % (ref 11.5–15.5)
WBC: 3.3 10*3/uL — AB (ref 4.0–10.5)
WBC: 4.1 10*3/uL (ref 4.0–10.5)

## 2016-05-13 LAB — BASIC METABOLIC PANEL
ANION GAP: 8 (ref 5–15)
BUN: 22 mg/dL — ABNORMAL HIGH (ref 6–20)
CALCIUM: 7.8 mg/dL — AB (ref 8.9–10.3)
CO2: 24 mmol/L (ref 22–32)
CREATININE: 7.31 mg/dL — AB (ref 0.61–1.24)
Chloride: 95 mmol/L — ABNORMAL LOW (ref 101–111)
GFR, EST AFRICAN AMERICAN: 8 mL/min — AB (ref >60)
GFR, EST NON AFRICAN AMERICAN: 7 mL/min — AB (ref >60)
Glucose, Bld: 103 mg/dL — ABNORMAL HIGH (ref 65–99)
Potassium: 4.1 mmol/L (ref 3.5–5.1)
SODIUM: 127 mmol/L — AB (ref 135–145)

## 2016-05-13 MED ORDER — RENA-VITE PO TABS
1.0000 | ORAL_TABLET | Freq: Every day | ORAL | Status: DC
  Administered 2016-05-13 – 2016-05-28 (×15): 1 via ORAL
  Filled 2016-05-13 (×15): qty 1

## 2016-05-13 MED ORDER — SODIUM CHLORIDE 0.9 % IV SOLN
Freq: Once | INTRAVENOUS | Status: AC
  Administered 2016-05-24: 08:00:00 via INTRAVENOUS

## 2016-05-13 NOTE — Consult Note (Signed)
ORTHOPAEDIC CONSULTATION  REQUESTING PHYSICIAN: Louellen Molder, MD  PCP:  No PCP Per Patient  Chief Complaint: evaluate left femur  HPI: Thomas [name] is a 70 y.o. male who complains of left thigh pain with attempted weightbearing. He also has chronic low back pain. He was previously told that he has a L3 compression fracture. He came to the ER a couple days ago for acute renal failure and nausea. He received dialysis for the first time ever yesterday. He was found on admission to be anemic. 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 The working diagnosis is multiple myeloma, but at this point in time, there is no biopsy to prove this. Orthopedic consultation was placed for management of impending pathologicleft femur fracture. The hospitalist tells me that he is on the schedule for a CT-guided  Biopsy of the left iliac wing on Monday. He is scheduled for dialysis later today and is supposed to be given 2 units of packed red blood cells. He does complain of left femur pain with attempted weightbearing.  Past Medical History  Diagnosis Date  . Compression fracture    Past Surgical History  Procedure Laterality Date  . Mouth surgery     Social History   Social History  . Marital Status: Married    Spouse Name: N/A  . Number of Children: N/A  . Years of Education: N/A   Social History Main Topics  . Smoking status: Former Research scientist (life sciences)  . Smokeless tobacco: None  . Alcohol Use: No  . Drug Use: No  . Sexual Activity: Not Asked   Other Topics Concern  . None   Social History Narrative   Family History  Problem Relation Age of Onset  . Diabetes Father    Allergies  Allergen Reactions  . Codeine Other (See Comments)    "MAKES ME JUMPY"  . Penicillins     Has patient had a PCN reaction causing immediate rash, facial/tongue/throat  swelling, SOB or lightheadedness with hypotension: Yes Has patient had a PCN reaction causing severe rash involving mucus membranes or skin necrosis: Yes Has patient had a PCN reaction that required hospitalization: No Has patient had a PCN reaction occurring within the last 10 years: No If all of the above answers are "NO", then may proceed with Cephalosporin use.    Prior to Admission medications   Not on File   Ir Fluoro Guide Cv Line Right  05/12/2016  INDICATION: 70 year old with renal failure.  Catheter needed for hemodialysis. EXAM: FLUOROSCOPIC AND ULTRASOUND GUIDED PLACEMENT OF A TUNNELED DIALYSIS CATHETER Physician: Stephan Minister. Anselm Pancoast, MD MEDICATIONS: Vancomycin 1 g; The antibiotic was administered within an appropriate time interval prior to skin puncture. ANESTHESIA/SEDATION: Versed 1.5 mg IV; Fentanyl 50 mcg IV; Moderate Sedation Time:  34 The patient was continuously monitored during the procedure by the interventional radiology nurse under my direct supervision. FLUOROSCOPY TIME:  Fluoroscopy Time: 36 seconds, 4 mGy COMPLICATIONS: None immediate. PROCEDURE: Informed consent was obtained for placement of a tunneled dialysis catheter. The patient was placed supine on the interventional table. Ultrasound confirmed a patent right internal jugular vein. Ultrasound images were obtained for documentation. The right side of the neck was prepped and draped in a sterile fashion. The right side of the neck was anesthetized with 1% lidocaine. Maximal barrier sterile technique was utilized including caps, mask, sterile gowns, sterile gloves,  sterile drape, hand hygiene and skin antiseptic. A small incision was made with #11 blade scalpel. A 21 gauge needle directed into the right internal jugular vein with ultrasound guidance. A micropuncture dilator set was placed. A 19 cm tip to cuff Palindrome catheter was selected. The skin below the right clavicle was anesthetized and a small incision was made with an #11  blade scalpel. A subcutaneous tunnel was formed to the vein dermatotomy site. The catheter was brought through the tunnel. The vein dermatotomy site was dilated to accommodate a peel-away sheath. The catheter was placed through the peel-away sheath and directed into the central venous structures. The tip of the catheter was placed at the superior cavoatrial junction with fluoroscopy. Fluoroscopic images were obtained for documentation. Both lumens were found to aspirate and flush well. The proper amount of heparin was flushed in both lumens. The vein dermatotomy site was closed using a single layer of absorbable suture and Dermabond. The catheter was secured to the skin using Prolene suture. Fluoroscopic and ultrasound images were taken and saved for documentation. IMPRESSION: Successful placement of a right jugular tunneled dialysis catheter using ultrasound and fluoroscopic guidance. Electronically Signed   By: Markus Daft M.D.   On: 05/12/2016 06:47   Ir US Guide Vasc Access Right  05/12/2016  INDICATION: 70 year old with renal failure.  Catheter needed for hemodialysis. EXAM: FLUOROSCOPIC AND ULTRASOUND GUIDED PLACEMENT OF A TUNNELED DIALYSIS CATHETER Physician: Stephan Minister. Anselm Pancoast, MD MEDICATIONS: Vancomycin 1 g; The antibiotic was administered within an appropriate time interval prior to skin puncture. ANESTHESIA/SEDATION: Versed 1.5 mg IV; Fentanyl 50 mcg IV; Moderate Sedation Time:  34 The patient was continuously monitored during the procedure by the interventional radiology nurse under my direct supervision. FLUOROSCOPY TIME:  Fluoroscopy Time: 36 seconds, 4 mGy COMPLICATIONS: None immediate. PROCEDURE: Informed consent was obtained for placement of a tunneled dialysis catheter. The patient was placed supine on the interventional table. Ultrasound confirmed a patent right internal jugular vein. Ultrasound images were obtained for documentation. The right side of the neck was prepped and draped in a sterile  fashion. The right side of the neck was anesthetized with 1% lidocaine. Maximal barrier sterile technique was utilized including caps, mask, sterile gowns, sterile gloves, sterile drape, hand hygiene and skin antiseptic. A small incision was made with #11 blade scalpel. A 21 gauge needle directed into the right internal jugular vein with ultrasound guidance. A micropuncture dilator set was placed. A 19 cm tip to cuff Palindrome catheter was selected. The skin below the right clavicle was anesthetized and a small incision was made with an #11 blade scalpel. A subcutaneous tunnel was formed to the vein dermatotomy site. The catheter was brought through the tunnel. The vein dermatotomy site was dilated to accommodate a peel-away sheath. The catheter was placed through the peel-away sheath and directed into the central venous structures. The tip of the catheter was placed at the superior cavoatrial junction with fluoroscopy. Fluoroscopic images were obtained for documentation. Both lumens were found to aspirate and flush well. The proper amount of heparin was flushed in both lumens. The vein dermatotomy site was closed using a single layer of absorbable suture and Dermabond. The catheter was secured to the skin using Prolene suture. Fluoroscopic and ultrasound images were taken and saved for documentation. IMPRESSION: Successful placement of a right jugular tunneled dialysis catheter using ultrasound and fluoroscopic guidance. Electronically Signed   By: Markus Daft M.D.   On: 05/12/2016 06:47   Ir US Guide Bx  Asp/drain  05/12/2016  INDICATION: 70 year old with renal failure.  Request for renal biopsy. EXAM: ULTRASOUND-GUIDED RENAL BIOPSY MEDICATIONS: None. ANESTHESIA/SEDATION: Patient was monitored by radiology nurse throughout the procedure. Sedation was continued from the prior hemodialysis catheter placement. FLUOROSCOPY TIME:  None COMPLICATIONS: None immediate. PROCEDURE: Informed written consent was obtained  from the patient after a thorough discussion of the procedural risks, benefits and alternatives. All questions were addressed. Maximal Sterile Barrier Technique was utilized including caps, mask, sterile gowns, sterile gloves, sterile drape, hand hygiene and skin antiseptic. A timeout was performed prior to the initiation of the procedure. Patient was placed prone. Both kidneys were evaluated with ultrasound. The right kidney was selected for biopsy. The right flank was prepped with chlorhexidine and a sterile field was created. Skin was anesthetized with 1% lidocaine. Using ultrasound guidance, 16 gauge core biopsy was obtained from the right kidney lower pole. Specimen placed in saline. A second ultrasound-guided core biopsy was obtained from the lower pole. Specimen was placed in saline. These samples were felt to be adequate. Bandage placed over the puncture site. FINDINGS: Two core biopsies obtained from the right kidney lower pole. Small perinephric hematoma identified following the procedure. This hematoma did not appear to be expanding based on post biopsy visualization. IMPRESSION: Ultrasound-guided core biopsies of the right kidney lower pole. Electronically Signed   By: Markus Daft M.D.   On: 05/12/2016 06:54   Dg Bone Survey Met  05/12/2016  CLINICAL DATA:  Back pain and weakness. Evaluate for multiple myeloma and bone cancer. EXAM: METASTATIC BONE SURVEY COMPARISON:  05/10/2016 FINDINGS: Patient is edentulous. Subtle lucent areas in the calvarium are suspicious. Right jugular dialysis catheter tip in the lower SVC. No suspicious lesions in the upper extremities. Multilevel degenerativee facet disease in the cervical spine. Normal alignment of cervical spine. Prevertebral soft tissues are normal. Normal alignment of the thoracic spine. There appears to be 6 non rib-bearing vertebral bodies. Assuming that S1 is a transitional vertebral body, there is a suspicious lucent lesion along the left side of the  L5 vertebral body and pedicle. Again noted is mild vertebral body height loss at L5. There is a stable superior compression deformity of the L3 vertebral body. Concern expansion of the left L1 pedicle. Cannot exclude a lesion at this location. Again noted is an expansile lesion involving the left eighth rib. This is better visualized on prior chest radiograph. Pelvic radiograph demonstrate a large lucent lesion involving the left iliac wing. This lesion measures up to 11 cm. Heterogeneous area of lucency in the midshaft of the left femur is concerning for a lesion. There is cortical thinning in this area. No suspicious lesions in the right lower extremity. IMPRESSION: Multiple bone lesions are suggestive for metastatic bone disease and suspicious for multiple myeloma. Largest lesion is involving the left iliac wing and this would be the most amenable to biopsy. There is also an expansile lesion involving the left eighth rib and a lucent lesion involving the L5 left pedicle. Question a lesion of the left L1 pedicle. Probable small calvarial lesions. Suspicious lucency involving the midshaft of the left femur with cortical thinning. This is concerning for a metastatic bone lesion and would be at risk for a pathologic fracture. L3 compression fracture.  Questionable compression fracture at L5. These results were called by telephone at the time of interpretation on 05/12/2016 at 4:47 pm to Dr. Clementeen Graham, who verbally acknowledged these results. Electronically Signed   By: Scherrie Gerlach.D.  On: 05/12/2016 16:47    Positive ROS: All other systems have been reviewed and were otherwise negative with the exception of those mentioned in the HPI and as above.  Physical Exam: General: Alert, no acute distress Cardiovascular: No pedal edema Respiratory: No cyanosis, no use of accessory musculature GI: No organomegaly, abdomen is soft and non-tender Skin: No lesions in the area of chief complaint Neurologic: Sensation  intact distally Psychiatric: Patient is competent for consent with normal mood and affect Lymphatic: No axillary or cervical lymphadenopathy  MUSCULOSKELETAL: Focused examination of the left lower extremity reveals no skin wounds or lesions. The patient is very thin. No significant pain with logrolling of the hip. He has positive motor function dorsiflexion, plantar flexion, and great toe extension. He has palpable pedal pulses. He reports intact sensation to light touch in all distributions.  Assessment: Impending pathologic fracture left femur Multiple lytic skeletal lesions, workup consistent with multiple myeloma  Plan: I had a lengthy discussion with the patient and his family at the bedside. He is at significant risk of pathologic fracture of the left femur.  I would recommend that we take him to the operating room for open biopsy of the left femur with frozen sections to be evaluated by pathology. We would then proceed with prophylactic nailing of the left femur. He will need radiation therapy to the left femur postoperatively.This would save him from needing a CT-guided biopsy on Monday. We discussed the risks, benefits, and alternatives. He is going to have dialysis today and be transfused 2 units of packed red blood cells. He will be nothing by mouth after midnight. We'll place him on the surgery schedule for tomorrow morning. All questions were solicited and answered to their satisfaction.    Ellaina Schuler, Horald Pollen, MD Cell [phone]    05/13/2016 10:41 AM

## 2016-05-13 NOTE — Progress Notes (Signed)
PROGRESS NOTE                                                                                                                                                                                                             Patient Demographics:    Thomas [name], is a 70 y.o. male, DOB - 24-Jul-1946, JAS:[id number]  Admit date - 05/09/2016   Admitting Physician Etta Quill, DO  Outpatient Primary MD for the patient is No PCP Per Patient  LOS - 3  Outpatient Specialists: none  Chief Complaint  Patient presents with  . low hgb        Brief Narrative   70 year old male with no significant medical history (had not seen a physician in many years) presented with about 1 month of nausea with daily vomiting. Also reports poor by mouth intake and subjective weight loss. Denied any diarrhea, hematemesis or bleeding per rectum. Patient went to the urgent care and was found to have a hemoglobin of 9.8 and sent to the ED. In the ED blood work showed creatinine >10 and BUN of 40. He was taking high dose of NSAIDs about 3 months back for severe low back pain, prescribed by his chiropractor. Reports that he told them for less than 2 weeks. He was also found to have severe hyperkalemia and given insulin with dextrose. Admitted to hospitalist service for further management.  Lab work showing hyperproteinemia, abnormal lucency of lumbar vertebrae and multiple radiolucencies in the skull x-ray indicated towards multiple myeloma.    Subjective:   Seen during HD. Discussed skeletal survey findings and plan on OR tomorrow.    Assessment  & Plan :    Principal Problem:  Acute versus acute on chronic kidney disease UA showed proteinuria and WBC with hyaline cast. Also has high serum protein level. Ultrasound abdomen shows normal size kidneys.  Concern for AKI due  to myeloma with / without AIN from high dose NSAIDs use. -ANA and complements,   ANCA and hepatitis panel negative. -SPEP, light chains and  IFE indicated towards multiple myeloma.. -Renal biopsy done, results pending. Started on hemodialysis on 6/2. Receiving hemodialysis today as well. Tolerating well.    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  Also lesion involving midshaft  of left femur with pathological fracture. There is also L3 and L5 compression fracture. Ordered MRI of the lumbar spine rule out cord compression. Dr. Alvy Bimler following. Recommend bone marrow biopsy (if on biopsy done with surgery tomorrow this won't be needed).  Pathological fracture of left femur Orthopedics consulted. Scheduled for or tomorrow with open biopsy of the left femur. Bone marrow biopsy will not be required if this is done. Patient will be placed on bedrest with strict nonweightbearing on left leg.  Anemia with thrombocytopenia. Iron panel suggestive of chronic disease however fecal Hemoccult positive. Monitor closely. Will need GI evaluation once acute workup completed. Progressive thrombocytopenia. Platelet function assay is high. Avoid heparin products.  Received one unit PRBC on 6/1. Ordered another 2 units today with dialysis. Recheck labs in a.m.   Vitamin B12 deficiency Significantly low levels (106). Added supplements.   Hyperkalemia Improved after IV insulin and dextrose. Now on dialysis.    Malnutrition of moderate degree        Code Status : Full code  Family Communication  : Wife and daughter at bedside  Disposition Plan  : Currently inpatient.  Barriers For Discharge : Acute kidney injury requiring RRT. Surgery for pathological fracture. Workup for multiple myeloma.  Consults  :  Nephrology (Dr. Milford Footman) IR Oncology (Dr. Alvy Bimler) Orthopedics (Dr Delfino Lovett)   Procedures  :  Renal ultrasound Dialysis catheter  placement Left Renal biopsy  DVT Prophylaxis  :  SCDs   Lab Results  Component Value Date   PLT 53* 05/13/2016    Antibiotics  :  None  Anti-infectives    Start     Dose/Rate Route Frequency Ordered Stop   05/11/16 1225  vancomycin (VANCOCIN) 1-5 GM/200ML-% IVPB  Status:  Discontinued    CommentsKarlton Lemon, [place]   : cabinet override      05/11/16 1225 05/11/16 1234   05/11/16 1100  vancomycin (VANCOCIN) IVPB 1000 mg/200 mL premix     1,000 mg 200 mL/hr over 60 Minutes Intravenous To Radiology 05/11/16 0911 05/11/16 1354        Objective:   Filed Vitals:   05/13/16 1330 05/13/16 1400 05/13/16 1430 05/13/16 1515  BP: 120/61 136/72 134/71 133/68  Pulse: 78 78 76 76  Temp: 97 F (36.1 C) 97.2 F (36.2 C)  97.3 F (36.3 C)  TempSrc: Oral Oral  Oral  Resp: 16 18  16   Height:      Weight:    75.4 kg (166 lb 3.6 oz)  SpO2: 98% 98%  100%    Wt Readings from Last 3 Encounters:  05/13/16 75.4 kg (166 lb 3.6 oz)     Intake/Output Summary (Last 24 hours) at 05/13/16 1537 Last data filed at 05/13/16 1515  Gross per 24 hour  Intake   1370 ml  Output    196 ml  Net   1174 ml     Physical Exam  Gen: Seen during dialysis, not in distress,  HEENT:  moist mucosa, supple neck Chest: clear b/l, no added sounds, Right-sided dialysis catheter CVS: N S1&S2, no murmurs, rubs or gallop GI: soft, NT, ND, BS+,  Musculoskeletal: warm, no edema CNS: Alert and oriented    Data Review:    CBC  Recent Labs Lab 05/09/16 2144  05/11/16 0919 05/11/16 2030 05/12/16 0659 05/12/16 2107 05/13/16 1223  WBC 6.2  --  4.3 3.9*  --  4.2 3.3*  HGB 9.2*  < > 7.5* 6.9* 7.8* 7.5* 7.5*  HCT 26.5*  < >  22.1* 20.6* 23.1* 21.9* 22.0*  PLT 105*  --  77* 64*  --  67* 53*  MCV 85.8  --  86.3 87.7  --  85.5 86.6  MCH 29.8  --  29.3 29.4  --  29.3 29.5  MCHC 34.7  --  33.9 33.5  --  34.2 34.1  RDW 12.3  --  12.2 12.4  --  12.5 12.5  LYMPHSABS 1.1  --   --   --   --   --   --   MONOABS  0.6  --   --   --   --   --   --   EOSABS 0.1  --   --   --   --   --   --   BASOSABS 0.0  --   --   --   --   --   --   < > = values in this interval not displayed.  Chemistries   Recent Labs Lab 05/10/16 0247 05/10/16 0310 05/10/16 0318 05/11/16 0740 05/12/16 0913 05/13/16 1223  NA 135  --  133* 132* 129* 128*  K 6.7*  --  5.2* 5.1 5.3* 4.4  CL 106  --  102 102 100* 97*  CO2  --   --  18* 20* 17* 23  GLUCOSE 157*  --  120* 85 111* 112*  BUN 61*  --  43* 47* 53* 36*  CREATININE 10.50*  --  10.68* 12.25* 13.22* 9.93*  CALCIUM  --   --  8.7* 8.5* 8.2* 8.1*  MG  --  1.9  --   --   --   --   AST  --  13*  --   --   --   --   ALT  --  9*  --   --   --   --   ALKPHOS  --  72  --   --   --   --   BILITOT  --  0.4  --   --   --   --    ------------------------------------------------------------------------------------------------------------------ No results for input(s): CHOL, HDL, LDLCALC, TRIG, CHOLHDL, LDLDIRECT in the last 72 hours.  No results found for: HGBA1C ------------------------------------------------------------------------------------------------------------------ No results for input(s): TSH, T4TOTAL, T3FREE, THYROIDAB in the last 72 hours.  Invalid input(s): FREET3 ------------------------------------------------------------------------------------------------------------------ No results for input(s): VITAMINB12, FOLATE, FERRITIN, TIBC, IRON, RETICCTPCT in the last 72 hours.  Coagulation profile  Recent Labs Lab 05/10/16 1156 05/11/16 0500  INR 1.40 1.46    No results for input(s): DDIMER in the last 72 hours.  Cardiac Enzymes No results for input(s): CKMB, TROPONINI, MYOGLOBIN in the last 168 hours.  Invalid input(s): CK ------------------------------------------------------------------------------------------------------------------ No results found for: BNP  Inpatient Medications  Scheduled Meds: . sodium chloride   Intravenous Once  .  sodium chloride   Intravenous Once  . feeding supplement (NEPRO CARB STEADY)  237 mL Oral BID BM  . multivitamin  1 tablet Oral QHS  . vitamin B-12  1,000 mcg Oral Daily   Continuous Infusions:  PRN Meds:.  Micro Results No results found for this or any previous visit (from the past 240 hour(s)).  Radiology Reports Dg Skull 1-3 Views  05/11/2016  CLINICAL DATA:  Myeloma EXAM: SKULL - 1-3 VIEW COMPARISON:  None. FINDINGS: Frontal view shows 13 mm lucency in the parasagittal left skull. On the lateral view approximately 10 round or oval lucencies are identified, the largest measuring 14 mm in maximal diameter. There  are also lucencies in both mandibles. IMPRESSION: Multiple radiolucencies consistent with myeloma. Electronically Signed   By: Skipper Cliche M.D.   On: 05/11/2016 10:42   Dg Chest 2 View  05/10/2016  CLINICAL DATA:  Congestive heart failure. EXAM: CHEST  2 VIEW COMPARISON:  None. FINDINGS: The heart size and mediastinal contours are within normal limits. No pneumothorax or pleural effusion is noted. Right lung is clear. Density is seen in left lower lobe which may represent either pleural-based mass or possibly expansile lesion within left posterior rib. The visualized skeletal structures are unremarkable. IMPRESSION: Left lower lobe density is noted representing pleural-based mass or expansile lesion within left posterior rib. CT scan of the chest is recommended to evaluate for possible neoplasm or malignancy. Electronically Signed   By: Marijo Conception, M.D.   On: 05/10/2016 13:08   Dg Lumbar Spine 2-3 Views  05/10/2016  CLINICAL DATA:  Lower lumbar pain radiating down the left leg. Injury in February 2017. EXAM: LUMBAR SPINE - 2-3 VIEW COMPARISON:  None. FINDINGS: Transitional S1 vertebra. Abnormal lucency in the left L5 vertebra appreciable on the frontal projection, potentially with some slight compression in this vicinity, but with lucency out of proportion to the compression.  Irregular margins of the cortex of the S1 vertebra on the lateral projection. Anterior wedge compression fracture at L3 with 30% loss of vertebral body height. Aortoiliac atherosclerotic vascular disease. IMPRESSION: 1. Compression fractures at L3 and L5, with abnormal lucency in the left side of the L5 vertebral body raising concern for multiple myeloma or lytic metastatic disease from a radiographic standpoint. MRI could be utilized for further characterization if clinically warranted. 2. There is also cortical thickening and irregularity of the S1 vertebra, underlying malignancy not excluded at S 1. 3. 30% compression fracture at L 3, age indeterminate. 4. Lumbar spondylosis. 5.  Aortoiliac atherosclerotic vascular disease. Electronically Signed   By: Van Clines M.D.   On: 05/10/2016 16:48   US Renal  05/10/2016  CLINICAL DATA:  Acute kidney injury. EXAM: RENAL / URINARY TRACT ULTRASOUND COMPLETE COMPARISON:  None. FINDINGS: Right Kidney: Length: 11.4 cm. Echogenicity within normal limits. No mass or hydronephrosis visualized. Left Kidney: Length: 11.6 cm. Echogenicity within normal limits. There is a 2.3 x 2.1 x 2.1 cm cyst in the upper pole. No fall with mass or hydronephrosis visualized. Bladder: Appears normal for degree of bladder distention. IMPRESSION: 1. Left renal cyst.  Otherwise unremarkable renal ultrasound. 2. No obstructive uropathy. Electronically Signed   By: Jeb Levering M.D.   On: 05/10/2016 04:29   Ir Fluoro Guide Cv Line Right  05/12/2016  INDICATION: 70 year old with renal failure.  Catheter needed for hemodialysis. EXAM: FLUOROSCOPIC AND ULTRASOUND GUIDED PLACEMENT OF A TUNNELED DIALYSIS CATHETER Physician: Stephan Minister. Anselm Pancoast, MD MEDICATIONS: Vancomycin 1 g; The antibiotic was administered within an appropriate time interval prior to skin puncture. ANESTHESIA/SEDATION: Versed 1.5 mg IV; Fentanyl 50 mcg IV; Moderate Sedation Time:  34 The patient was continuously monitored during  the procedure by the interventional radiology nurse under my direct supervision. FLUOROSCOPY TIME:  Fluoroscopy Time: 36 seconds, 4 mGy COMPLICATIONS: None immediate. PROCEDURE: Informed consent was obtained for placement of a tunneled dialysis catheter. The patient was placed supine on the interventional table. Ultrasound confirmed a patent right internal jugular vein. Ultrasound images were obtained for documentation. The right side of the neck was prepped and draped in a sterile fashion. The right side of the neck was anesthetized with 1% lidocaine. Maximal barrier  sterile technique was utilized including caps, mask, sterile gowns, sterile gloves, sterile drape, hand hygiene and skin antiseptic. A small incision was made with #11 blade scalpel. A 21 gauge needle directed into the right internal jugular vein with ultrasound guidance. A micropuncture dilator set was placed. A 19 cm tip to cuff Palindrome catheter was selected. The skin below the right clavicle was anesthetized and a small incision was made with an #11 blade scalpel. A subcutaneous tunnel was formed to the vein dermatotomy site. The catheter was brought through the tunnel. The vein dermatotomy site was dilated to accommodate a peel-away sheath. The catheter was placed through the peel-away sheath and directed into the central venous structures. The tip of the catheter was placed at the superior cavoatrial junction with fluoroscopy. Fluoroscopic images were obtained for documentation. Both lumens were found to aspirate and flush well. The proper amount of heparin was flushed in both lumens. The vein dermatotomy site was closed using a single layer of absorbable suture and Dermabond. The catheter was secured to the skin using Prolene suture. Fluoroscopic and ultrasound images were taken and saved for documentation. IMPRESSION: Successful placement of a right jugular tunneled dialysis catheter using ultrasound and fluoroscopic guidance. Electronically  Signed   By: Markus Daft M.D.   On: 05/12/2016 06:47   Ir US Guide Vasc Access Right  05/12/2016  INDICATION: 70 year old with renal failure.  Catheter needed for hemodialysis. EXAM: FLUOROSCOPIC AND ULTRASOUND GUIDED PLACEMENT OF A TUNNELED DIALYSIS CATHETER Physician: Stephan Minister. Anselm Pancoast, MD MEDICATIONS: Vancomycin 1 g; The antibiotic was administered within an appropriate time interval prior to skin puncture. ANESTHESIA/SEDATION: Versed 1.5 mg IV; Fentanyl 50 mcg IV; Moderate Sedation Time:  34 The patient was continuously monitored during the procedure by the interventional radiology nurse under my direct supervision. FLUOROSCOPY TIME:  Fluoroscopy Time: 36 seconds, 4 mGy COMPLICATIONS: None immediate. PROCEDURE: Informed consent was obtained for placement of a tunneled dialysis catheter. The patient was placed supine on the interventional table. Ultrasound confirmed a patent right internal jugular vein. Ultrasound images were obtained for documentation. The right side of the neck was prepped and draped in a sterile fashion. The right side of the neck was anesthetized with 1% lidocaine. Maximal barrier sterile technique was utilized including caps, mask, sterile gowns, sterile gloves, sterile drape, hand hygiene and skin antiseptic. A small incision was made with #11 blade scalpel. A 21 gauge needle directed into the right internal jugular vein with ultrasound guidance. A micropuncture dilator set was placed. A 19 cm tip to cuff Palindrome catheter was selected. The skin below the right clavicle was anesthetized and a small incision was made with an #11 blade scalpel. A subcutaneous tunnel was formed to the vein dermatotomy site. The catheter was brought through the tunnel. The vein dermatotomy site was dilated to accommodate a peel-away sheath. The catheter was placed through the peel-away sheath and directed into the central venous structures. The tip of the catheter was placed at the superior cavoatrial junction  with fluoroscopy. Fluoroscopic images were obtained for documentation. Both lumens were found to aspirate and flush well. The proper amount of heparin was flushed in both lumens. The vein dermatotomy site was closed using a single layer of absorbable suture and Dermabond. The catheter was secured to the skin using Prolene suture. Fluoroscopic and ultrasound images were taken and saved for documentation. IMPRESSION: Successful placement of a right jugular tunneled dialysis catheter using ultrasound and fluoroscopic guidance. Electronically Signed   By: Scherrie Gerlach.D.  On: 05/12/2016 06:47   Ir US Guide Bx Asp/drain  05/12/2016  INDICATION: 70 year old with renal failure.  Request for renal biopsy. EXAM: ULTRASOUND-GUIDED RENAL BIOPSY MEDICATIONS: None. ANESTHESIA/SEDATION: Patient was monitored by radiology nurse throughout the procedure. Sedation was continued from the prior hemodialysis catheter placement. FLUOROSCOPY TIME:  None COMPLICATIONS: None immediate. PROCEDURE: Informed written consent was obtained from the patient after a thorough discussion of the procedural risks, benefits and alternatives. All questions were addressed. Maximal Sterile Barrier Technique was utilized including caps, mask, sterile gowns, sterile gloves, sterile drape, hand hygiene and skin antiseptic. A timeout was performed prior to the initiation of the procedure. Patient was placed prone. Both kidneys were evaluated with ultrasound. The right kidney was selected for biopsy. The right flank was prepped with chlorhexidine and a sterile field was created. Skin was anesthetized with 1% lidocaine. Using ultrasound guidance, 16 gauge core biopsy was obtained from the right kidney lower pole. Specimen placed in saline. A second ultrasound-guided core biopsy was obtained from the lower pole. Specimen was placed in saline. These samples were felt to be adequate. Bandage placed over the puncture site. FINDINGS: Two core biopsies obtained  from the right kidney lower pole. Small perinephric hematoma identified following the procedure. This hematoma did not appear to be expanding based on post biopsy visualization. IMPRESSION: Ultrasound-guided core biopsies of the right kidney lower pole. Electronically Signed   By: Markus Daft M.D.   On: 05/12/2016 06:54   Dg Bone Survey Met  05/12/2016  CLINICAL DATA:  Back pain and weakness. Evaluate for multiple myeloma and bone cancer. EXAM: METASTATIC BONE SURVEY COMPARISON:  05/10/2016 FINDINGS: Patient is edentulous. Subtle lucent areas in the calvarium are suspicious. Right jugular dialysis catheter tip in the lower SVC. No suspicious lesions in the upper extremities. Multilevel degenerativee facet disease in the cervical spine. Normal alignment of cervical spine. Prevertebral soft tissues are normal. Normal alignment of the thoracic spine. There appears to be 6 non rib-bearing vertebral bodies. Assuming that S1 is a transitional vertebral body, there is a suspicious lucent lesion along the left side of the L5 vertebral body and pedicle. Again noted is mild vertebral body height loss at L5. There is a stable superior compression deformity of the L3 vertebral body. Concern expansion of the left L1 pedicle. Cannot exclude a lesion at this location. Again noted is an expansile lesion involving the left eighth rib. This is better visualized on prior chest radiograph. Pelvic radiograph demonstrate a large lucent lesion involving the left iliac wing. This lesion measures up to 11 cm. Heterogeneous area of lucency in the midshaft of the left femur is concerning for a lesion. There is cortical thinning in this area. No suspicious lesions in the right lower extremity. IMPRESSION: Multiple bone lesions are suggestive for metastatic bone disease and suspicious for multiple myeloma. Largest lesion is involving the left iliac wing and this would be the most amenable to biopsy. There is also an expansile lesion involving  the left eighth rib and a lucent lesion involving the L5 left pedicle. Question a lesion of the left L1 pedicle. Probable small calvarial lesions. Suspicious lucency involving the midshaft of the left femur with cortical thinning. This is concerning for a metastatic bone lesion and would be at risk for a pathologic fracture. L3 compression fracture.  Questionable compression fracture at L5. These results were called by telephone at the time of interpretation on 05/12/2016 at 4:47 pm to Dr. Clementeen Graham, who verbally acknowledged these results. Electronically Signed  By: Markus Daft M.D.   On: 05/12/2016 16:47   Dg Femur Min 2 Views Left  05/13/2016  CLINICAL DATA:  Left femur pain anteriorly from the left hip to the knee. EXAM: LEFT FEMUR 2 VIEWS COMPARISON:  Bone survey 05/12/2016 FINDINGS: There is permeative lucency within the midshaft of the femur with ill-defined margins and cortical involvement as described on yesterday's study. This lesion involves a length of at least 6 cm. No displaced fracture is identified. The left hip and left knee are located. No soft tissue abnormality is seen. IMPRESSION: Permeative left midshaft femur lesion concerning for malignancy as described on yesterday's study. No fracture identified. Electronically Signed   By: Logan Bores M.D.   On: 05/13/2016 11:40    Time Spent in minutes  25   Louellen Molder M.D on 05/13/2016 at 3:37 PM  Between 7am to 7pm - Pager - [phone]  After 7pm go to [email or web address] - password [facility]  [facility] -  Office  [phone]

## 2016-05-13 NOTE — Progress Notes (Signed)
Thomas [name]   DOB:01/24/1946   MQ#:[id number]    Subjective: He feels well this morning. Denies significant pain. Tolerated hemodialysis well and has good urine output. Denies nausea  Objective:  Filed Vitals:   05/12/16 2224 05/13/16 0624  BP: 151/59 121/50  Pulse: 89 83  Temp: 98.5 F (36.9 C) 98.1 F (36.7 C)  Resp: 16 16     Intake/Output Summary (Last 24 hours) at 05/13/16 1657 Last data filed at 05/13/16 9038  Gross per 24 hour  Intake    920 ml  Output    300 ml  Net    620 ml    GENERAL:alert, no distress and comfortable SKIN: skin color, texture, turgor are normal, no rashes or significant lesions EYES: normal, Conjunctiva are pink and non-injected, sclera clear Musculoskeletal:no cyanosis of digits and no clubbing  NEURO: alert & oriented x 3 with fluent speech, no focal motor/sensory deficits   Labs:  Lab Results  Component Value Date   WBC 4.2 05/12/2016   HGB 7.5* 05/12/2016   HCT 21.9* 05/12/2016   MCV 85.5 05/12/2016   PLT 67* 05/12/2016   NEUTROABS 4.4 05/09/2016    Lab Results  Component Value Date   NA 129* 05/12/2016   K 5.3* 05/12/2016   CL 100* 05/12/2016   CO2 17* 05/12/2016    Studies:  Dg Skull 1-3 Views  05/11/2016  CLINICAL DATA:  Myeloma EXAM: SKULL - 1-3 VIEW COMPARISON:  None. FINDINGS: Frontal view shows 13 mm lucency in the parasagittal left skull. On the lateral view approximately 10 round or oval lucencies are identified, the largest measuring 14 mm in maximal diameter. There are also lucencies in both mandibles. IMPRESSION: Multiple radiolucencies consistent with myeloma. Electronically Signed   By: Skipper Cliche M.D.   On: 05/11/2016 10:42   Ir Fluoro Guide Cv Line Right  05/12/2016  INDICATION: 70 year old with renal failure.  Catheter needed for hemodialysis. EXAM: FLUOROSCOPIC AND ULTRASOUND GUIDED PLACEMENT OF A TUNNELED DIALYSIS CATHETER Physician: Stephan Minister. Anselm Pancoast, MD MEDICATIONS: Vancomycin 1 g; The antibiotic was administered  within an appropriate time interval prior to skin puncture. ANESTHESIA/SEDATION: Versed 1.5 mg IV; Fentanyl 50 mcg IV; Moderate Sedation Time:  34 The patient was continuously monitored during the procedure by the interventional radiology nurse under my direct supervision. FLUOROSCOPY TIME:  Fluoroscopy Time: 36 seconds, 4 mGy COMPLICATIONS: None immediate. PROCEDURE: Informed consent was obtained for placement of a tunneled dialysis catheter. The patient was placed supine on the interventional table. Ultrasound confirmed a patent right internal jugular vein. Ultrasound images were obtained for documentation. The right side of the neck was prepped and draped in a sterile fashion. The right side of the neck was anesthetized with 1% lidocaine. Maximal barrier sterile technique was utilized including caps, mask, sterile gowns, sterile gloves, sterile drape, hand hygiene and skin antiseptic. A small incision was made with #11 blade scalpel. A 21 gauge needle directed into the right internal jugular vein with ultrasound guidance. A micropuncture dilator set was placed. A 19 cm tip to cuff Palindrome catheter was selected. The skin below the right clavicle was anesthetized and a small incision was made with an #11 blade scalpel. A subcutaneous tunnel was formed to the vein dermatotomy site. The catheter was brought through the tunnel. The vein dermatotomy site was dilated to accommodate a peel-away sheath. The catheter was placed through the peel-away sheath and directed into the central venous structures. The tip of the catheter was placed at the superior cavoatrial junction  with fluoroscopy. Fluoroscopic images were obtained for documentation. Both lumens were found to aspirate and flush well. The proper amount of heparin was flushed in both lumens. The vein dermatotomy site was closed using a single layer of absorbable suture and Dermabond. The catheter was secured to the skin using Prolene suture. Fluoroscopic and  ultrasound images were taken and saved for documentation. IMPRESSION: Successful placement of a right jugular tunneled dialysis catheter using ultrasound and fluoroscopic guidance. Electronically Signed   By: Markus Daft M.D.   On: 05/12/2016 06:47   Ir US Guide Vasc Access Right  05/12/2016  INDICATION: 69 year old with renal failure.  Catheter needed for hemodialysis. EXAM: FLUOROSCOPIC AND ULTRASOUND GUIDED PLACEMENT OF A TUNNELED DIALYSIS CATHETER Physician: Stephan Minister. Anselm Pancoast, MD MEDICATIONS: Vancomycin 1 g; The antibiotic was administered within an appropriate time interval prior to skin puncture. ANESTHESIA/SEDATION: Versed 1.5 mg IV; Fentanyl 50 mcg IV; Moderate Sedation Time:  34 The patient was continuously monitored during the procedure by the interventional radiology nurse under my direct supervision. FLUOROSCOPY TIME:  Fluoroscopy Time: 36 seconds, 4 mGy COMPLICATIONS: None immediate. PROCEDURE: Informed consent was obtained for placement of a tunneled dialysis catheter. The patient was placed supine on the interventional table. Ultrasound confirmed a patent right internal jugular vein. Ultrasound images were obtained for documentation. The right side of the neck was prepped and draped in a sterile fashion. The right side of the neck was anesthetized with 1% lidocaine. Maximal barrier sterile technique was utilized including caps, mask, sterile gowns, sterile gloves, sterile drape, hand hygiene and skin antiseptic. A small incision was made with #11 blade scalpel. A 21 gauge needle directed into the right internal jugular vein with ultrasound guidance. A micropuncture dilator set was placed. A 19 cm tip to cuff Palindrome catheter was selected. The skin below the right clavicle was anesthetized and a small incision was made with an #11 blade scalpel. A subcutaneous tunnel was formed to the vein dermatotomy site. The catheter was brought through the tunnel. The vein dermatotomy site was dilated to accommodate  a peel-away sheath. The catheter was placed through the peel-away sheath and directed into the central venous structures. The tip of the catheter was placed at the superior cavoatrial junction with fluoroscopy. Fluoroscopic images were obtained for documentation. Both lumens were found to aspirate and flush well. The proper amount of heparin was flushed in both lumens. The vein dermatotomy site was closed using a single layer of absorbable suture and Dermabond. The catheter was secured to the skin using Prolene suture. Fluoroscopic and ultrasound images were taken and saved for documentation. IMPRESSION: Successful placement of a right jugular tunneled dialysis catheter using ultrasound and fluoroscopic guidance. Electronically Signed   By: Markus Daft M.D.   On: 05/12/2016 06:47   Ir US Guide Bx Asp/drain  05/12/2016  INDICATION: 70 year old with renal failure.  Request for renal biopsy. EXAM: ULTRASOUND-GUIDED RENAL BIOPSY MEDICATIONS: None. ANESTHESIA/SEDATION: Patient was monitored by radiology nurse throughout the procedure. Sedation was continued from the prior hemodialysis catheter placement. FLUOROSCOPY TIME:  None COMPLICATIONS: None immediate. PROCEDURE: Informed written consent was obtained from the patient after a thorough discussion of the procedural risks, benefits and alternatives. All questions were addressed. Maximal Sterile Barrier Technique was utilized including caps, mask, sterile gowns, sterile gloves, sterile drape, hand hygiene and skin antiseptic. A timeout was performed prior to the initiation of the procedure. Patient was placed prone. Both kidneys were evaluated with ultrasound. The right kidney was selected for biopsy. The right  flank was prepped with chlorhexidine and a sterile field was created. Skin was anesthetized with 1% lidocaine. Using ultrasound guidance, 16 gauge core biopsy was obtained from the right kidney lower pole. Specimen placed in saline. A second ultrasound-guided  core biopsy was obtained from the lower pole. Specimen was placed in saline. These samples were felt to be adequate. Bandage placed over the puncture site. FINDINGS: Two core biopsies obtained from the right kidney lower pole. Small perinephric hematoma identified following the procedure. This hematoma did not appear to be expanding based on post biopsy visualization. IMPRESSION: Ultrasound-guided core biopsies of the right kidney lower pole. Electronically Signed   By: Markus Daft M.D.   On: 05/12/2016 06:54   Dg Bone Survey Met  05/12/2016  CLINICAL DATA:  Back pain and weakness. Evaluate for multiple myeloma and bone cancer. EXAM: METASTATIC BONE SURVEY COMPARISON:  05/10/2016 FINDINGS: Patient is edentulous. Subtle lucent areas in the calvarium are suspicious. Right jugular dialysis catheter tip in the lower SVC. No suspicious lesions in the upper extremities. Multilevel degenerativee facet disease in the cervical spine. Normal alignment of cervical spine. Prevertebral soft tissues are normal. Normal alignment of the thoracic spine. There appears to be 6 non rib-bearing vertebral bodies. Assuming that S1 is a transitional vertebral body, there is a suspicious lucent lesion along the left side of the L5 vertebral body and pedicle. Again noted is mild vertebral body height loss at L5. There is a stable superior compression deformity of the L3 vertebral body. Concern expansion of the left L1 pedicle. Cannot exclude a lesion at this location. Again noted is an expansile lesion involving the left eighth rib. This is better visualized on prior chest radiograph. Pelvic radiograph demonstrate a large lucent lesion involving the left iliac wing. This lesion measures up to 11 cm. Heterogeneous area of lucency in the midshaft of the left femur is concerning for a lesion. There is cortical thinning in this area. No suspicious lesions in the right lower extremity. IMPRESSION: Multiple bone lesions are suggestive for  metastatic bone disease and suspicious for multiple myeloma. Largest lesion is involving the left iliac wing and this would be the most amenable to biopsy. There is also an expansile lesion involving the left eighth rib and a lucent lesion involving the L5 left pedicle. Question a lesion of the left L1 pedicle. Probable small calvarial lesions. Suspicious lucency involving the midshaft of the left femur with cortical thinning. This is concerning for a metastatic bone lesion and would be at risk for a pathologic fracture. L3 compression fracture.  Questionable compression fracture at L5. These results were called by telephone at the time of interpretation on 05/12/2016 at 4:47 pm to Dr. Clementeen Graham, who verbally acknowledged these results. Electronically Signed   By: Markus Daft M.D.   On: 05/12/2016 16:47    Assessment & Plan:   Multiple myeloma with renal failure He tolerated hemodialysis well I reviewed the skeletal survey and I am very concerned about high risk for pathological fracture of the left femur I recommended orthopedic consultation for possible prophylactic stabilizing surgery. The patient may need short-term radiation to the region as well. I reviewed plan of care again with patient and primary service. Hopefully, he can get CT-guided bone marrow biopsy for further cytogenetics and FISH analysis I will return on Wednesday afternoon to have family meeting/discussion and review of all test results I addressed all questions and concerns  [facility], Avrian Delfavero, MD 05/13/2016  7:28 AM

## 2016-05-13 NOTE — Progress Notes (Signed)
Subjective: Interval History: has no complaint .  Objective: Vital signs in last 24 hours: Temp:  [98 F (36.7 C)-99.7 F (37.6 C)] 99.7 F (37.6 C) (06/03 0741) Pulse Rate:  [75-89] 78 (06/03 0741) Resp:  [14-20] 17 (06/03 0741) BP: (121-151)/(50-75) 122/63 mmHg (06/03 0741) SpO2:  [96 %-100 %] 97 % (06/03 0741) Weight change:   Intake/Output from previous day: 06/02 0701 - 06/03 0700 In: 920 [P.O.:920] Out: 300 [Urine:300] Intake/Output this shift:    General appearance: alert, cooperative and no distress Resp: clear to auscultation bilaterally Chest wall: RIJ cath Cardio: S1, S2 normal and systolic murmur: holosystolic 2/6, blowing at apex GI: soft,pos bs, liver down 5 cm Extremities: extremities normal, atraumatic, no cyanosis or edema  Lab Results:  Recent Labs  05/11/16 2030 05/12/16 0659 05/12/16 2107  WBC 3.9*  --  4.2  HGB 6.9* 7.8* 7.5*  HCT 20.6* 23.1* 21.9*  PLT 64*  --  67*   BMET:  Recent Labs  05/11/16 0740 05/12/16 0913  NA 132* 129*  K 5.1 5.3*  CL 102 100*  CO2 20* 17*  GLUCOSE 85 111*  BUN 47* 53*  CREATININE 12.25* 13.22*  CALCIUM 8.5* 8.2*    Recent Labs  05/10/16 1156  PTH 38   Iron Studies:  Recent Labs  05/10/16 1156  IRON 64  TIBC 167*    Studies/Results: Dg Skull 1-3 Views  05/11/2016  CLINICAL DATA:  Myeloma EXAM: SKULL - 1-3 VIEW COMPARISON:  None. FINDINGS: Frontal view shows 13 mm lucency in the parasagittal left skull. On the lateral view approximately 10 round or oval lucencies are identified, the largest measuring 14 mm in maximal diameter. There are also lucencies in both mandibles. IMPRESSION: Multiple radiolucencies consistent with myeloma. Electronically Signed   By: Skipper Cliche M.D.   On: 05/11/2016 10:42   Ir Fluoro Guide Cv Line Right  05/12/2016  INDICATION: 70 year old with renal failure.  Catheter needed for hemodialysis. EXAM: FLUOROSCOPIC AND ULTRASOUND GUIDED PLACEMENT OF A TUNNELED DIALYSIS  CATHETER Physician: Stephan Minister. Anselm Pancoast, MD MEDICATIONS: Vancomycin 1 g; The antibiotic was administered within an appropriate time interval prior to skin puncture. ANESTHESIA/SEDATION: Versed 1.5 mg IV; Fentanyl 50 mcg IV; Moderate Sedation Time:  34 The patient was continuously monitored during the procedure by the interventional radiology nurse under my direct supervision. FLUOROSCOPY TIME:  Fluoroscopy Time: 36 seconds, 4 mGy COMPLICATIONS: None immediate. PROCEDURE: Informed consent was obtained for placement of a tunneled dialysis catheter. The patient was placed supine on the interventional table. Ultrasound confirmed a patent right internal jugular vein. Ultrasound images were obtained for documentation. The right side of the neck was prepped and draped in a sterile fashion. The right side of the neck was anesthetized with 1% lidocaine. Maximal barrier sterile technique was utilized including caps, mask, sterile gowns, sterile gloves, sterile drape, hand hygiene and skin antiseptic. A small incision was made with #11 blade scalpel. A 21 gauge needle directed into the right internal jugular vein with ultrasound guidance. A micropuncture dilator set was placed. A 19 cm tip to cuff Palindrome catheter was selected. The skin below the right clavicle was anesthetized and a small incision was made with an #11 blade scalpel. A subcutaneous tunnel was formed to the vein dermatotomy site. The catheter was brought through the tunnel. The vein dermatotomy site was dilated to accommodate a peel-away sheath. The catheter was placed through the peel-away sheath and directed into the central venous structures. The tip of the catheter was  placed at the superior cavoatrial junction with fluoroscopy. Fluoroscopic images were obtained for documentation. Both lumens were found to aspirate and flush well. The proper amount of heparin was flushed in both lumens. The vein dermatotomy site was closed using a single layer of absorbable  suture and Dermabond. The catheter was secured to the skin using Prolene suture. Fluoroscopic and ultrasound images were taken and saved for documentation. IMPRESSION: Successful placement of a right jugular tunneled dialysis catheter using ultrasound and fluoroscopic guidance. Electronically Signed   By: Markus Daft M.D.   On: 05/12/2016 06:47   Ir US Guide Vasc Access Right  05/12/2016  INDICATION: 70 year old with renal failure.  Catheter needed for hemodialysis. EXAM: FLUOROSCOPIC AND ULTRASOUND GUIDED PLACEMENT OF A TUNNELED DIALYSIS CATHETER Physician: Stephan Minister. Anselm Pancoast, MD MEDICATIONS: Vancomycin 1 g; The antibiotic was administered within an appropriate time interval prior to skin puncture. ANESTHESIA/SEDATION: Versed 1.5 mg IV; Fentanyl 50 mcg IV; Moderate Sedation Time:  34 The patient was continuously monitored during the procedure by the interventional radiology nurse under my direct supervision. FLUOROSCOPY TIME:  Fluoroscopy Time: 36 seconds, 4 mGy COMPLICATIONS: None immediate. PROCEDURE: Informed consent was obtained for placement of a tunneled dialysis catheter. The patient was placed supine on the interventional table. Ultrasound confirmed a patent right internal jugular vein. Ultrasound images were obtained for documentation. The right side of the neck was prepped and draped in a sterile fashion. The right side of the neck was anesthetized with 1% lidocaine. Maximal barrier sterile technique was utilized including caps, mask, sterile gowns, sterile gloves, sterile drape, hand hygiene and skin antiseptic. A small incision was made with #11 blade scalpel. A 21 gauge needle directed into the right internal jugular vein with ultrasound guidance. A micropuncture dilator set was placed. A 19 cm tip to cuff Palindrome catheter was selected. The skin below the right clavicle was anesthetized and a small incision was made with an #11 blade scalpel. A subcutaneous tunnel was formed to the vein dermatotomy site.  The catheter was brought through the tunnel. The vein dermatotomy site was dilated to accommodate a peel-away sheath. The catheter was placed through the peel-away sheath and directed into the central venous structures. The tip of the catheter was placed at the superior cavoatrial junction with fluoroscopy. Fluoroscopic images were obtained for documentation. Both lumens were found to aspirate and flush well. The proper amount of heparin was flushed in both lumens. The vein dermatotomy site was closed using a single layer of absorbable suture and Dermabond. The catheter was secured to the skin using Prolene suture. Fluoroscopic and ultrasound images were taken and saved for documentation. IMPRESSION: Successful placement of a right jugular tunneled dialysis catheter using ultrasound and fluoroscopic guidance. Electronically Signed   By: Markus Daft M.D.   On: 05/12/2016 06:47   Ir US Guide Bx Asp/drain  05/12/2016  INDICATION: 70 year old with renal failure.  Request for renal biopsy. EXAM: ULTRASOUND-GUIDED RENAL BIOPSY MEDICATIONS: None. ANESTHESIA/SEDATION: Patient was monitored by radiology nurse throughout the procedure. Sedation was continued from the prior hemodialysis catheter placement. FLUOROSCOPY TIME:  None COMPLICATIONS: None immediate. PROCEDURE: Informed written consent was obtained from the patient after a thorough discussion of the procedural risks, benefits and alternatives. All questions were addressed. Maximal Sterile Barrier Technique was utilized including caps, mask, sterile gowns, sterile gloves, sterile drape, hand hygiene and skin antiseptic. A timeout was performed prior to the initiation of the procedure. Patient was placed prone. Both kidneys were evaluated with ultrasound. The right kidney  was selected for biopsy. The right flank was prepped with chlorhexidine and a sterile field was created. Skin was anesthetized with 1% lidocaine. Using ultrasound guidance, 16 gauge core biopsy was  obtained from the right kidney lower pole. Specimen placed in saline. A second ultrasound-guided core biopsy was obtained from the lower pole. Specimen was placed in saline. These samples were felt to be adequate. Bandage placed over the puncture site. FINDINGS: Two core biopsies obtained from the right kidney lower pole. Small perinephric hematoma identified following the procedure. This hematoma did not appear to be expanding based on post biopsy visualization. IMPRESSION: Ultrasound-guided core biopsies of the right kidney lower pole. Electronically Signed   By: Markus Daft M.D.   On: 05/12/2016 06:54   Dg Bone Survey Met  05/12/2016  CLINICAL DATA:  Back pain and weakness. Evaluate for multiple myeloma and bone cancer. EXAM: METASTATIC BONE SURVEY COMPARISON:  05/10/2016 FINDINGS: Patient is edentulous. Subtle lucent areas in the calvarium are suspicious. Right jugular dialysis catheter tip in the lower SVC. No suspicious lesions in the upper extremities. Multilevel degenerativee facet disease in the cervical spine. Normal alignment of cervical spine. Prevertebral soft tissues are normal. Normal alignment of the thoracic spine. There appears to be 6 non rib-bearing vertebral bodies. Assuming that S1 is a transitional vertebral body, there is a suspicious lucent lesion along the left side of the L5 vertebral body and pedicle. Again noted is mild vertebral body height loss at L5. There is a stable superior compression deformity of the L3 vertebral body. Concern expansion of the left L1 pedicle. Cannot exclude a lesion at this location. Again noted is an expansile lesion involving the left eighth rib. This is better visualized on prior chest radiograph. Pelvic radiograph demonstrate a large lucent lesion involving the left iliac wing. This lesion measures up to 11 cm. Heterogeneous area of lucency in the midshaft of the left femur is concerning for a lesion. There is cortical thinning in this area. No suspicious  lesions in the right lower extremity. IMPRESSION: Multiple bone lesions are suggestive for metastatic bone disease and suspicious for multiple myeloma. Largest lesion is involving the left iliac wing and this would be the most amenable to biopsy. There is also an expansile lesion involving the left eighth rib and a lucent lesion involving the L5 left pedicle. Question a lesion of the left L1 pedicle. Probable small calvarial lesions. Suspicious lucency involving the midshaft of the left femur with cortical thinning. This is concerning for a metastatic bone lesion and would be at risk for a pathologic fracture. L3 compression fracture.  Questionable compression fracture at L5. These results were called by telephone at the time of interpretation on 05/12/2016 at 4:47 pm to Dr. Clementeen Graham, who verbally acknowledged these results. Electronically Signed   By: Markus Daft M.D.   On: 05/12/2016 16:47    I have reviewed the patient's current medications.  Assessment/Plan: 1 AKI/CKD  Hd x 1 yest, for HD today.  Follow chem . Renal Bx pending 2 anemia on esa, has MM 3 Comp fx , unstable L femur   Punched out lesions in skull  Prob MM 4 low ptlt prob MM P HD, no hep , cont esa, await Renal and BM bx    LOS: 3 days   Mont Jagoda L 05/13/2016,10:02 AM

## 2016-05-14 ENCOUNTER — Inpatient Hospital Stay (HOSPITAL_COMMUNITY): Payer: PPO

## 2016-05-14 ENCOUNTER — Inpatient Hospital Stay (HOSPITAL_COMMUNITY): Payer: PPO | Admitting: Anesthesiology

## 2016-05-14 ENCOUNTER — Encounter (HOSPITAL_COMMUNITY)
Admission: EM | Disposition: A | Discharge status: Rehabilitation Facility | Payer: Self-pay | Source: Home / Self Care | Attending: Internal Medicine

## 2016-05-14 DIAGNOSIS — I9581 Postprocedural hypotension: Secondary | ICD-10-CM

## 2016-05-14 DIAGNOSIS — C9000 Multiple myeloma not having achieved remission: Secondary | ICD-10-CM | POA: Diagnosis present

## 2016-05-14 HISTORY — PX: BONE BIOPSY: SHX375

## 2016-05-14 HISTORY — PX: FEMUR IM NAIL: SHX1597

## 2016-05-14 LAB — BASIC METABOLIC PANEL
Anion gap: 7 (ref 5–15)
Anion gap: 7 (ref 5–15)
BUN: 27 mg/dL — AB (ref 6–20)
BUN: 31 mg/dL — AB (ref 6–20)
CALCIUM: 7.6 mg/dL — AB (ref 8.9–10.3)
CHLORIDE: 97 mmol/L — AB (ref 101–111)
CHLORIDE: 101 mmol/L (ref 101–111)
CO2: 25 mmol/L (ref 22–32)
CO2: 22 mmol/L (ref 22–32)
CREATININE: 8.31 mg/dL — AB (ref 0.61–1.24)
CREATININE: 8.32 mg/dL — AB (ref 0.61–1.24)
Calcium: 8.2 mg/dL — ABNORMAL LOW (ref 8.9–10.3)
GFR calc non Af Amer: 6 mL/min — ABNORMAL LOW (ref >60)
GFR calc non Af Amer: 6 mL/min — ABNORMAL LOW (ref >60)
GFR, EST AFRICAN AMERICAN: 7 mL/min — AB (ref >60)
GFR, EST AFRICAN AMERICAN: 7 mL/min — AB (ref >60)
Glucose, Bld: 79 mg/dL (ref 65–99)
Glucose, Bld: 136 mg/dL — ABNORMAL HIGH (ref 65–99)
POTASSIUM: 4.4 mmol/L (ref 3.5–5.1)
Potassium: 5.2 mmol/L — ABNORMAL HIGH (ref 3.5–5.1)
SODIUM: 130 mmol/L — AB (ref 135–145)
Sodium: 129 mmol/L — ABNORMAL LOW (ref 135–145)

## 2016-05-14 LAB — CBC
HCT: 22.1 % — ABNORMAL LOW (ref 39.0–52.0)
HEMATOCRIT: 28.5 % — AB (ref 39.0–52.0)
HEMOGLOBIN: 9.7 g/dL — AB (ref 13.0–17.0)
HEMOGLOBIN: 7.6 g/dL — AB (ref 13.0–17.0)
MCH: 29 pg (ref 26.0–34.0)
MCH: 30 pg (ref 26.0–34.0)
MCHC: 34 g/dL (ref 30.0–36.0)
MCHC: 34.4 g/dL (ref 30.0–36.0)
MCV: 85.3 fL (ref 78.0–100.0)
MCV: 87.4 fL (ref 78.0–100.0)
PLATELETS: 99 10*3/uL — AB (ref 150–400)
Platelets: 56 10*3/uL — ABNORMAL LOW (ref 150–400)
RBC: 3.34 MIL/uL — AB (ref 4.22–5.81)
RBC: 2.53 MIL/uL — ABNORMAL LOW (ref 4.22–5.81)
RDW: 13.6 % (ref 11.5–15.5)
RDW: 13.3 % (ref 11.5–15.5)
WBC: 4.4 10*3/uL (ref 4.0–10.5)
WBC: 6.8 10*3/uL (ref 4.0–10.5)

## 2016-05-14 LAB — SURGICAL PCR SCREEN
MRSA, PCR: NEGATIVE
STAPHYLOCOCCUS AUREUS: NEGATIVE

## 2016-05-14 LAB — IMMUNOGLOBULINS A/E/G/M, SERUM
IGG (IMMUNOGLOBIN G), SERUM: 3460 mg/dL — AB (ref 700–1600)
IgA: 18 mg/dL — ABNORMAL LOW (ref 61–437)
IgM, Serum: 6 mg/dL — ABNORMAL LOW (ref 20–172)

## 2016-05-14 LAB — PREPARE RBC (CROSSMATCH)

## 2016-05-14 SURGERY — INSERTION, INTRAMEDULLARY ROD, FEMUR
Anesthesia: General | Site: Leg Upper | Laterality: Left

## 2016-05-14 MED ORDER — METOCLOPRAMIDE HCL 5 MG/ML IJ SOLN
5.0000 mg | Freq: Three times a day (TID) | INTRAMUSCULAR | Status: DC | PRN
  Filled 2016-05-14: qty 2

## 2016-05-14 MED ORDER — NEOSTIGMINE METHYLSULFATE 10 MG/10ML IV SOLN
INTRAVENOUS | Status: DC | PRN
  Administered 2016-05-14: 3 mg via INTRAVENOUS

## 2016-05-14 MED ORDER — POLYETHYLENE GLYCOL 3350 17 G PO PACK: 17.0000 g | PACK | Freq: Every day | ORAL | Status: DC | PRN

## 2016-05-14 MED ORDER — 0.9 % SODIUM CHLORIDE (POUR BTL) OPTIME
TOPICAL | Status: DC | PRN
  Administered 2016-05-14: 1000 mL

## 2016-05-14 MED ORDER — LIDOCAINE HCL (PF) 1 % IJ SOLN: 5.0000 mL | INTRAMUSCULAR | Status: DC | PRN

## 2016-05-14 MED ORDER — MIDAZOLAM HCL 2 MG/2ML IJ SOLN
INTRAMUSCULAR | Status: AC
  Filled 2016-05-14: qty 2

## 2016-05-14 MED ORDER — PHENOL 1.4 % MT LIQD: 1.0000 | OROMUCOSAL | Status: DC | PRN

## 2016-05-14 MED ORDER — CLINDAMYCIN PHOSPHATE 900 MG/50ML IV SOLN
900.0000 mg | INTRAVENOUS | Status: AC
  Administered 2016-05-14: 900 mg via INTRAVENOUS
  Filled 2016-05-14 (×2): qty 50

## 2016-05-14 MED ORDER — BISACODYL 5 MG PO TBEC: 5.0000 mg | DELAYED_RELEASE_TABLET | Freq: Every day | ORAL | Status: DC | PRN

## 2016-05-14 MED ORDER — SODIUM CHLORIDE 0.9 % IV SOLN
INTRAVENOUS | Status: DC
  Administered 2016-05-14 (×3): via INTRAVENOUS

## 2016-05-14 MED ORDER — PENTAFLUOROPROP-TETRAFLUOROETH EX AERO: 1.0000 "application " | INHALATION_SPRAY | CUTANEOUS | Status: DC | PRN

## 2016-05-14 MED ORDER — FENTANYL CITRATE (PF) 100 MCG/2ML IJ SOLN
INTRAMUSCULAR | Status: DC | PRN
  Administered 2016-05-14 (×5): 50 ug via INTRAVENOUS

## 2016-05-14 MED ORDER — ONDANSETRON HCL 4 MG/2ML IJ SOLN
INTRAMUSCULAR | Status: DC | PRN
  Administered 2016-05-14: 4 mg via INTRAVENOUS

## 2016-05-14 MED ORDER — ESMOLOL HCL 100 MG/10ML IV SOLN
INTRAVENOUS | Status: AC
  Filled 2016-05-14: qty 10

## 2016-05-14 MED ORDER — HYDROMORPHONE HCL 1 MG/ML IJ SOLN
INTRAMUSCULAR | Status: AC
  Filled 2016-05-14: qty 1

## 2016-05-14 MED ORDER — PHENYLEPHRINE HCL 10 MG/ML IJ SOLN
INTRAMUSCULAR | Status: DC | PRN
  Administered 2016-05-14 (×3): 40 ug via INTRAVENOUS
  Administered 2016-05-14: 80 ug via INTRAVENOUS

## 2016-05-14 MED ORDER — DOCUSATE SODIUM 100 MG PO CAPS
100.0000 mg | ORAL_CAPSULE | Freq: Two times a day (BID) | ORAL | Status: DC
  Administered 2016-05-14 – 2016-05-25 (×19): 100 mg via ORAL
  Filled 2016-05-14 (×22): qty 1

## 2016-05-14 MED ORDER — CHLORHEXIDINE GLUCONATE 4 % EX LIQD
60.0000 mL | Freq: Once | CUTANEOUS | Status: AC
  Administered 2016-05-14: 4 via TOPICAL
  Filled 2016-05-14: qty 60

## 2016-05-14 MED ORDER — ROCURONIUM BROMIDE 50 MG/5ML IV SOLN
INTRAVENOUS | Status: AC
  Filled 2016-05-14: qty 1

## 2016-05-14 MED ORDER — ACETAMINOPHEN 650 MG RE SUPP: 650.0000 mg | Freq: Four times a day (QID) | RECTAL | Status: DC | PRN

## 2016-05-14 MED ORDER — GLYCOPYRROLATE 0.2 MG/ML IV SOSY
PREFILLED_SYRINGE | INTRAVENOUS | Status: AC
  Filled 2016-05-14: qty 3

## 2016-05-14 MED ORDER — EPHEDRINE SULFATE 50 MG/ML IJ SOLN
INTRAMUSCULAR | Status: DC | PRN
  Administered 2016-05-14: 5 mg via INTRAVENOUS

## 2016-05-14 MED ORDER — HYDROCODONE-ACETAMINOPHEN 5-325 MG PO TABS
1.0000 | ORAL_TABLET | Freq: Four times a day (QID) | ORAL | Status: DC | PRN
  Administered 2016-05-14 – 2016-05-15 (×3): 2 via ORAL
  Administered 2016-05-16: 1 via ORAL
  Administered 2016-05-17 – 2016-05-25 (×3): 2 via ORAL
  Filled 2016-05-14 (×6): qty 2
  Filled 2016-05-14: qty 1

## 2016-05-14 MED ORDER — LIDOCAINE HCL (CARDIAC) 20 MG/ML IV SOLN
INTRAVENOUS | Status: DC | PRN
  Administered 2016-05-14: 40 mg via INTRAVENOUS

## 2016-05-14 MED ORDER — ONDANSETRON HCL 4 MG/2ML IJ SOLN
INTRAMUSCULAR | Status: AC
  Filled 2016-05-14: qty 2

## 2016-05-14 MED ORDER — ALTEPLASE 2 MG IJ SOLR: 2.0000 mg | Freq: Once | INTRAMUSCULAR | Status: DC | PRN

## 2016-05-14 MED ORDER — ONDANSETRON HCL 4 MG/2ML IJ SOLN
4.0000 mg | Freq: Four times a day (QID) | INTRAMUSCULAR | Status: DC | PRN
  Administered 2016-05-14 – 2016-05-24 (×5): 4 mg via INTRAVENOUS
  Filled 2016-05-14 (×4): qty 2

## 2016-05-14 MED ORDER — ROCURONIUM BROMIDE 100 MG/10ML IV SOLN
INTRAVENOUS | Status: DC | PRN
  Administered 2016-05-14: 30 mg via INTRAVENOUS

## 2016-05-14 MED ORDER — POVIDONE-IODINE 10 % EX SWAB: 2.0000 "application " | Freq: Once | CUTANEOUS | Status: DC

## 2016-05-14 MED ORDER — HYDROMORPHONE HCL 1 MG/ML IJ SOLN
0.2500 mg | INTRAMUSCULAR | Status: DC | PRN
  Administered 2016-05-14: 0.25 mg via INTRAVENOUS

## 2016-05-14 MED ORDER — PROPOFOL 10 MG/ML IV BOLUS
INTRAVENOUS | Status: DC | PRN
  Administered 2016-05-14: 50 mg via INTRAVENOUS
  Administered 2016-05-14: 100 mg via INTRAVENOUS

## 2016-05-14 MED ORDER — FENTANYL CITRATE (PF) 250 MCG/5ML IJ SOLN
INTRAMUSCULAR | Status: AC
  Filled 2016-05-14: qty 5

## 2016-05-14 MED ORDER — MIDAZOLAM HCL 5 MG/5ML IJ SOLN
INTRAMUSCULAR | Status: DC | PRN
  Administered 2016-05-14: 2 mg via INTRAVENOUS

## 2016-05-14 MED ORDER — ACETAMINOPHEN 325 MG PO TABS: 650.0000 mg | ORAL_TABLET | Freq: Four times a day (QID) | ORAL | Status: DC | PRN

## 2016-05-14 MED ORDER — ALUM & MAG HYDROXIDE-SIMETH 200-200-20 MG/5ML PO SUSP: 30.0000 mL | ORAL | Status: DC | PRN

## 2016-05-14 MED ORDER — ONDANSETRON HCL 4 MG/2ML IJ SOLN: 4.0000 mg | Freq: Four times a day (QID) | INTRAMUSCULAR | Status: DC | PRN

## 2016-05-14 MED ORDER — SODIUM CHLORIDE 0.9 % IV SOLN: 100.0000 mL | INTRAVENOUS | Status: DC | PRN

## 2016-05-14 MED ORDER — SODIUM CHLORIDE 0.9 % IV SOLN: INTRAVENOUS | Status: DC

## 2016-05-14 MED ORDER — OXYCODONE HCL 5 MG PO TABS: 5.0000 mg | ORAL_TABLET | Freq: Once | ORAL | Status: DC | PRN

## 2016-05-14 MED ORDER — PHENYLEPHRINE HCL 10 MG/ML IJ SOLN
10.0000 mg | INTRAVENOUS | Status: DC | PRN
  Administered 2016-05-14: 50 ug/min via INTRAVENOUS

## 2016-05-14 MED ORDER — ONDANSETRON HCL 4 MG PO TABS
4.0000 mg | ORAL_TABLET | Freq: Four times a day (QID) | ORAL | Status: DC | PRN
  Filled 2016-05-14: qty 1

## 2016-05-14 MED ORDER — MENTHOL 3 MG MT LOZG
1.0000 | LOZENGE | OROMUCOSAL | Status: DC | PRN
  Administered 2016-05-20 – 2016-05-21 (×3): 3 mg via ORAL
  Filled 2016-05-14 (×3): qty 9

## 2016-05-14 MED ORDER — PROPOFOL 10 MG/ML IV BOLUS
INTRAVENOUS | Status: AC
  Filled 2016-05-14: qty 40

## 2016-05-14 MED ORDER — LIDOCAINE-PRILOCAINE 2.5-2.5 % EX CREA: 1.0000 "application " | TOPICAL_CREAM | CUTANEOUS | Status: DC | PRN

## 2016-05-14 MED ORDER — CLINDAMYCIN PHOSPHATE 600 MG/50ML IV SOLN
600.0000 mg | Freq: Four times a day (QID) | INTRAVENOUS | Status: AC
  Administered 2016-05-14 – 2016-05-15 (×2): 600 mg via INTRAVENOUS
  Filled 2016-05-14 (×2): qty 50

## 2016-05-14 MED ORDER — OXYCODONE HCL 5 MG/5ML PO SOLN: 5.0000 mg | Freq: Once | ORAL | Status: DC | PRN

## 2016-05-14 MED ORDER — METOCLOPRAMIDE HCL 5 MG PO TABS: 5.0000 mg | ORAL_TABLET | Freq: Three times a day (TID) | ORAL | Status: DC | PRN

## 2016-05-14 MED ORDER — NEOSTIGMINE METHYLSULFATE 5 MG/5ML IV SOSY
PREFILLED_SYRINGE | INTRAVENOUS | Status: AC
  Filled 2016-05-14: qty 5

## 2016-05-14 MED ORDER — MAGNESIUM CITRATE PO SOLN: 1.0000 | Freq: Once | ORAL | Status: DC | PRN

## 2016-05-14 MED ORDER — HYDROMORPHONE HCL 1 MG/ML IJ SOLN
1.0000 mg | INTRAMUSCULAR | Status: DC | PRN
  Administered 2016-05-15 – 2016-05-16 (×2): 1 mg via INTRAVENOUS
  Filled 2016-05-14: qty 1

## 2016-05-14 MED ORDER — ARTIFICIAL TEARS OP OINT
TOPICAL_OINTMENT | OPHTHALMIC | Status: AC
  Filled 2016-05-14: qty 3.5

## 2016-05-14 MED ORDER — HEPARIN SODIUM (PORCINE) 1000 UNIT/ML DIALYSIS: 1000.0000 [IU] | INTRAMUSCULAR | Status: DC | PRN

## 2016-05-14 MED ORDER — PROPOFOL 10 MG/ML IV BOLUS
INTRAVENOUS | Status: AC
  Filled 2016-05-14: qty 20

## 2016-05-14 MED ORDER — ALBUMIN HUMAN 5 % IV SOLN
INTRAVENOUS | Status: DC | PRN
  Administered 2016-05-14 (×2): via INTRAVENOUS

## 2016-05-14 MED ORDER — GLYCOPYRROLATE 0.2 MG/ML IJ SOLN
INTRAMUSCULAR | Status: DC | PRN
  Administered 2016-05-14: .5 mg via INTRAVENOUS

## 2016-05-14 SURGICAL SUPPLY — 52 items
ALCOHOL ISOPROPYL (RUBBING) (MISCELLANEOUS) ×3 IMPLANT
BIT DRILL 4.2 (DRILL) ×1 IMPLANT
BNDG COHESIVE 4X5 TAN STRL (GAUZE/BANDAGES/DRESSINGS) ×3 IMPLANT
CHLORAPREP W/TINT 26ML (MISCELLANEOUS) ×3 IMPLANT
COVER PERINEAL POST (MISCELLANEOUS) ×3 IMPLANT
COVER SURGICAL LIGHT HANDLE (MISCELLANEOUS) ×3 IMPLANT
DERMABOND ADHESIVE PROPEN (GAUZE/BANDAGES/DRESSINGS) ×2
DERMABOND ADVANCED (GAUZE/BANDAGES/DRESSINGS) ×4
DERMABOND ADVANCED .7 DNX12 (GAUZE/BANDAGES/DRESSINGS) ×2 IMPLANT
DERMABOND ADVANCED .7 DNX6 (GAUZE/BANDAGES/DRESSINGS) ×1 IMPLANT
DRAPE C-ARM 42X72 X-RAY (DRAPES) ×3 IMPLANT
DRAPE C-ARMOR (DRAPES) ×3 IMPLANT
DRAPE IMP U-DRAPE 54X76 (DRAPES) ×6 IMPLANT
DRAPE STERI IOBAN 125X83 (DRAPES) ×3 IMPLANT
DRAPE U-SHAPE 47X51 STRL (DRAPES) ×9 IMPLANT
DRAPE UNIVERSAL PACK (DRAPES) ×3 IMPLANT
DRILL 4.2 (DRILL) ×3
DRSG AQUACEL AG ADV 3.5X 6 (GAUZE/BANDAGES/DRESSINGS) ×3 IMPLANT
DRSG AQUACEL AG ADV 3.5X14 (GAUZE/BANDAGES/DRESSINGS) ×3 IMPLANT
DRSG MEPILEX BORDER 4X4 (GAUZE/BANDAGES/DRESSINGS) IMPLANT
DRSG PAD ABDOMINAL 8X10 ST (GAUZE/BANDAGES/DRESSINGS) IMPLANT
ELECT REM PT RETURN 9FT ADLT (ELECTROSURGICAL) ×3
ELECTRODE REM PT RTRN 9FT ADLT (ELECTROSURGICAL) ×1 IMPLANT
FACESHIELD WRAPAROUND (MASK) ×3 IMPLANT
GLOVE BIO SURGEON STRL SZ8.5 (GLOVE) ×6 IMPLANT
GLOVE BIOGEL PI IND STRL 8.5 (GLOVE) ×1 IMPLANT
GLOVE BIOGEL PI INDICATOR 8.5 (GLOVE) ×2
GOWN STRL REUS W/ TWL LRG LVL3 (GOWN DISPOSABLE) ×1 IMPLANT
GOWN STRL REUS W/TWL 2XL LVL3 (GOWN DISPOSABLE) ×3 IMPLANT
GOWN STRL REUS W/TWL LRG LVL3 (GOWN DISPOSABLE) ×2
GUIDEWIRE 3.2X400 (WIRE) ×6 IMPLANT
KIT ROOM TURNOVER OR (KITS) ×3 IMPLANT
MANIFOLD NEPTUNE II (INSTRUMENTS) ×3 IMPLANT
MARKER SKIN DUAL TIP RULER LAB (MISCELLANEOUS) ×6 IMPLANT
NAIL TROCH FIX LNG 11X420LT (Nail) ×3 IMPLANT
NS IRRIG 1000ML POUR BTL (IV SOLUTION) ×3 IMPLANT
PACK GENERAL/GYN (CUSTOM PROCEDURE TRAY) ×3 IMPLANT
PAD ARMBOARD 7.5X6 YLW CONV (MISCELLANEOUS) ×6 IMPLANT
PADDING CAST ABS 4INX4YD NS (CAST SUPPLIES)
PADDING CAST ABS COTTON 4X4 ST (CAST SUPPLIES) IMPLANT
REAMER ROD DEEP FLUTE 2.5X950 (INSTRUMENTS) ×6 IMPLANT
SCREW CANN LOCK TI FT 5X42 (Screw) ×3 IMPLANT
SCREW TFNA 105MM STERILE (Screw) ×3 IMPLANT
SPONGE SURGIFOAM ABS GEL 100 (HEMOSTASIS) ×3 IMPLANT
SUT MNCRL AB 3-0 PS2 18 (SUTURE) ×3 IMPLANT
SUT MNCRL AB 3-0 PS2 27 (SUTURE) ×3 IMPLANT
SUT MON AB 2-0 CT1 27 (SUTURE) ×3 IMPLANT
SUT MON AB 2-0 CT1 36 (SUTURE) ×3 IMPLANT
SUT VIC AB 1 CT1 27 (SUTURE) ×2
SUT VIC AB 1 CT1 27XBRD ANBCTR (SUTURE) ×1 IMPLANT
TOWEL OR 17X24 6PK STRL BLUE (TOWEL DISPOSABLE) ×3 IMPLANT
TOWEL OR 17X26 10 PK STRL BLUE (TOWEL DISPOSABLE) ×3 IMPLANT

## 2016-05-14 NOTE — Anesthesia Preprocedure Evaluation (Signed)
Anesthesia Evaluation  Patient identified by MRN, date of birth, ID band Patient awake    Reviewed: Allergy & Precautions, NPO status , Patient's Chart, lab work & pertinent test results  Airway Mallampati: II   Neck ROM: full    Dental   Pulmonary former smoker,    breath sounds clear to auscultation       Cardiovascular negative cardio ROS   Rhythm:regular Rate:Normal     Neuro/Psych    GI/Hepatic   Endo/Other    Renal/GU Renal Insufficiency and ARFRenal disease     Musculoskeletal   Abdominal   Peds  Hematology   Anesthesia Other Findings   Reproductive/Obstetrics                             Anesthesia Physical Anesthesia Plan  ASA: III  Anesthesia Plan: General   Post-op Pain Management:    Induction: Intravenous  Airway Management Planned: Oral ETT  Additional Equipment:   Intra-op Plan:   Post-operative Plan: Extubation in OR  Informed Consent: I have reviewed the patients History and Physical, chart, labs and discussed the procedure including the risks, benefits and alternatives for the proposed anesthesia with the patient or authorized representative who has indicated his/her understanding and acceptance.     Plan Discussed with: CRNA, Anesthesiologist and Surgeon  Anesthesia Plan Comments:         Anesthesia Quick Evaluation

## 2016-05-14 NOTE — Transfer of Care (Signed)
Immediate Anesthesia Transfer of Care Note  Patient: Thomas [name]  Procedure(s) Performed: Procedure(s): INTRAMEDULLARY (IM) NAIL FEMORAL (Left) LEFT FEMORAL BIOPSY WITH INTRAOPERATIVE FROZEN SECTIONS  (Left)  Patient Location: PACU  Anesthesia Type:General  Level of Consciousness: awake, alert  and oriented  Airway & Oxygen Therapy: Patient Spontanous Breathing and Patient connected to nasal cannula oxygen  Post-op Assessment: Report given to RN and Post -op Vital signs reviewed and stable  Post vital signs: Reviewed and stable  Last Vitals:  Filed Vitals:   05/14/16 0903 05/14/16 1354  BP: 145/55 78/52  Pulse: 80 105  Temp: 37 C   Resp: 16 27    Last Pain:  Filed Vitals:   05/14/16 1357  PainSc: 0-No pain         Complications: No apparent anesthesia complications

## 2016-05-14 NOTE — Brief Op Note (Signed)
05/09/2016 - 05/14/2016  1:07 PM  PATIENT:  Thomas [name]  70 y.o. male  PRE-OPERATIVE DIAGNOSIS:  impending pathological femur fracture-multiple myeloma  POST-OPERATIVE DIAGNOSIS:  impending pathological femur fracture-multiple myeloma  PROCEDURE:  Procedure(s): INTRAMEDULLARY (IM) NAIL FEMORAL (Left) LEFT FEMORAL BIOPSY WITH INTRAOPERATIVE FROZEN SECTIONS  (Left)  SURGEON:  Surgeon(s) and Role:    * Rod Can, MD - Primary  PHYSICIAN ASSISTANT: JAckie Bissel, PA-C  ASSISTANTS: none   ANESTHESIA:   none  EBL:  Total I/O In: 500 [I.V.:500] Out: 350 [Blood:350]  BLOOD ADMINISTERED:none  DRAINS: none   LOCAL MEDICATIONS USED:  NONE  SPECIMEN:  Source of Specimen:  left femur lesion  DISPOSITION OF SPECIMEN:  PATHOLOGY  COUNTS:  YES  TOURNIQUET:  * No tourniquets in log *  DICTATION: .Other Dictation: Dictation Number [id number]  PLAN OF CARE: Admit to inpatient   PATIENT DISPOSITION:  PACU - hemodynamically stable.   Delay start of Pharmacological VTE agent (>24hrs) due to surgical blood loss or risk of bleeding: yes

## 2016-05-14 NOTE — Anesthesia Procedure Notes (Signed)
Procedure Name: Intubation Date/Time: 05/14/2016 11:07 AM Performed by: [place] Pink Pre-anesthesia Checklist: Patient identified, Emergency Drugs available, Suction available, Patient being monitored and Timeout performed Patient Re-evaluated:Patient Re-evaluated prior to inductionOxygen Delivery Method: Circle system utilized Preoxygenation: Pre-oxygenation with 100% oxygen Intubation Type: IV induction Ventilation: Two handed mask ventilation required Laryngoscope Size: Mac and 4 Grade View: Grade I Tube type: Oral Tube size: 7.5 mm Number of attempts: 1 Airway Equipment and Method: Stylet Placement Confirmation: ETT inserted through vocal cords under direct vision,  positive ETCO2 and breath sounds checked- equal and bilateral Secured at: 23 cm Tube secured with: Tape Dental Injury: Teeth and Oropharynx as per pre-operative assessment

## 2016-05-14 NOTE — Interval H&P Note (Signed)
History and Physical Interval Note:  05/14/2016 10:27 AM  Thomas [name]  has presented today for surgery, with the diagnosis of impending pathological femur fracture-multiple myeloma  The various methods of treatment have been discussed with the patient and family. After consideration of risks, benefits and other options for treatment, the patient has consented to  Procedure(s): INTRAMEDULLARY (IM) NAIL FEMORAL (Left) LEFT FEMORAL BIOPSY WITH INTRAOPERATIVE FROZEN SECTIONS  (Left) as a surgical intervention .  The patient's history has been reviewed, patient examined, no change in status, stable for surgery.  I have reviewed the patient's chart and labs.  Questions were answered to the patient's satisfaction.     [name], Horald Pollen

## 2016-05-14 NOTE — Progress Notes (Signed)
Patient verbalized understanding of scheduled procedure; hibiclens scrub complete; consent signed; incentive spirometry instruction provided with teach back return demonstration. Family present at bedside.

## 2016-05-14 NOTE — Progress Notes (Signed)
Orthopedic Tech Progress Note Patient Details:  Thomas [name] 21-Aug-1946 [id number] Applied OHF with trapeze to pt.'s bed.     Darrol Poke 05/14/2016, 8:59 PM

## 2016-05-14 NOTE — Progress Notes (Signed)
Plan noted for bone marrow biopsy to be obtained while in OR today by ortho.  If for some reason this is unable to be done, then please reorder a bone marrow biopsy by IR.  Otherwise, we will sign off for now.  Quantavia Frith E

## 2016-05-14 NOTE — Progress Notes (Signed)
PROGRESS NOTE                                                                                                                                                                                                             Patient Demographics:    Thomas [name], is a 70 y.o. male, DOB - [birth year], YJE:[id number]  Admit date - 05/09/2016   Admitting Physician Etta Quill, DO  Outpatient Primary MD for the patient is No PCP Per Patient  LOS - 4  Outpatient Specialists: none  Chief Complaint  Patient presents with  . low hgb        Brief Narrative   70 year old male with no significant medical history (had not seen a physician in many years) presented with about 1 month of nausea with daily vomiting. Also reports poor by mouth intake and subjective weight loss. Denied any diarrhea, hematemesis or bleeding per rectum. Patient went to the urgent care and was found to have a hemoglobin of 9.8 and sent to the ED. In the ED blood work showed creatinine >10 and BUN of 40. He was taking high dose of NSAIDs about 3 months back for severe low back pain, prescribed by his chiropractor. Reports that he told them for less than 2 weeks. He was also found to have severe hyperkalemia and given insulin with dextrose. Admitted to hospitalist service for further management.  Lab work showing hyperproteinemia, abnormal lucency of lumbar vertebrae and multiple radiolucencies in the skull x-ray indicated towards multiple myeloma.    Subjective:   Seen din PACU. Not in pain at present   Assessment  & Plan :    Principal Problem:  Acute versus acute on chronic kidney disease -proteinuria with hyaline cast.  high serum protein level. Ultrasound abdomen shows normal size kidneys.  Concern for AKI due  to myeloma with / without AIN from high dose NSAIDs use. -ANA, complements,  ANCA and hepatitis panel negative. -SPEP, light chains and  IFE  indicated towards multiple myeloma.. -Renal biopsy  results pending. Received hemodialysis on 6/2 and 6/3. -renal consult appreciated.   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 Also lesion involving midshaft of left femur with pathological fracture. L3 and L5 compression fracture. -check  MRI of the  lumbar spine rule out cord compression. Dr. Alvy Bimler following. Recommend bone marrow biopsy. Left femoral bx done with surgery so may not need one.  Pathological fracture of left femur Orthopedics consulted. IM nailing with open biopsy of the left femur with intraoperative frozen section done .  continue pain control.  Anemia with thrombocytopenia.  fecal Hemoccult positive. possibly due to myeloma .Monitor closely. received PRBC. Will need GI evaluation once acute workup completed. Progressive thrombocytopenia. Platelet function assay is high. Avoid heparin products.  Received 5 u PRBC so far ( 2 u post op) and 2 u platelets. Recheck labs in a.m.  Hypotension post op  SBP in 80s. Pt on phenylephrine. Received 2u prbc, 2 u plts and 2 doses of albumin post op. May need ICU monitoring if unable to wean of pressor   Vitamin B12 deficiency Significantly low levels (106). Added supplements.   Hyperkalemia Improved after IV insulin and dextrose. Now on dialysis.    Malnutrition of moderate degree        Code Status : Full code  Family Communication  : Wife  at bedside  Disposition Plan  : Currently inpatient.  Barriers For Discharge : Acute kidney injury requiring RRT. Spost op.  Workup for multiple myeloma.  Consults  :  Nephrology (Dr. Ishmeal Footman) IR Oncology (Dr. Alvy Bimler) Orthopedics (Dr Delfino Lovett)   Procedures  :  Renal ultrasound Dialysis catheter placement Left Renal biopsy  DVT Prophylaxis  :  SCDs   Lab Results  Component Value Date   PLT  56* 05/14/2016    Antibiotics  :  None  Anti-infectives    Start     Dose/Rate Route Frequency Ordered Stop   05/14/16 1200  clindamycin (CLEOCIN) IVPB 900 mg     900 mg 100 mL/hr over 30 Minutes Intravenous To Surgery 05/14/16 0055 05/14/16 1121   05/11/16 1225  vancomycin (VANCOCIN) 1-5 GM/200ML-% IVPB  Status:  Discontinued    Comments:  Shelton, Whitney   : cabinet override      05/11/16 1225 05/11/16 1234   05/11/16 1100  vancomycin (VANCOCIN) IVPB 1000 mg/200 mL premix     1,000 mg 200 mL/hr over 60 Minutes Intravenous To Radiology 05/11/16 0911 05/11/16 1354        Objective:   Filed Vitals:   05/14/16 1644 05/14/16 1654 05/14/16 1704 05/14/16 1714  BP: 103/56 96/55 99/60  107/61  Pulse: 80 77 73 74  Temp:      TempSrc:      Resp: 18 19 17 21   Height:      Weight:      SpO2: 100% 100% 100% 100%    Wt Readings from Last 3 Encounters:  05/13/16 75.4 kg (166 lb 3.6 oz)     Intake/Output Summary (Last 24 hours) at 05/14/16 1721 Last data filed at 05/14/16 1438  Gross per 24 hour  Intake   2161 ml  Output    600 ml  Net   1561 ml     Physical Exam  Gen: fatigued, not in distress HEENT:  moist mucosa, supple neck Chest: clear b/l, no added sounds, Right-sided dialysis catheter CVS: N S1&S2, no murmurs, rubs or gallop GI: soft, NT, ND, BS+,  Musculoskeletal: warm, no edema, dressing over left femur CNS: Alert and oriented    Data Review:    CBC  Recent Labs Lab 05/09/16 2144  05/11/16 2030 05/12/16 0659 05/12/16 2107 05/13/16 1223 05/13/16 1841 05/14/16 0517  WBC 6.2  < > 3.9*  --  4.2 3.3* 4.1 4.4  HGB 9.2*  < > 6.9* 7.8* 7.5* 7.5* 9.8* 9.7*  HCT 26.5*  < > 20.6* 23.1* 21.9* 22.0* 29.0* 28.5*  PLT 105*  < > 64*  --  67* 53* 59* 56*  MCV 85.8  < > 87.7  --  85.5 86.6 85.3 85.3  MCH 29.8  < > 29.4  --  29.3 29.5 28.8 29.0  MCHC 34.7  < > 33.5  --  34.2 34.1 33.8 34.0  RDW 12.3  < > 12.4  --  12.5 12.5 13.3 13.6  LYMPHSABS 1.1  --   --   --    --   --   --   --   MONOABS 0.6  --   --   --   --   --   --   --   EOSABS 0.1  --   --   --   --   --   --   --   BASOSABS 0.0  --   --   --   --   --   --   --   < > = values in this interval not displayed.  Chemistries   Recent Labs Lab 05/10/16 0310  05/11/16 0740 05/12/16 0913 05/13/16 1223 05/13/16 1841 05/14/16 0517  NA  --   < > 132* 129* 128* 127* 129*  K  --   < > 5.1 5.3* 4.4 4.1 4.4  CL  --   < > 102 100* 97* 95* 97*  CO2  --   < > 20* 17* 23 24 25   GLUCOSE  --   < > 85 111* 112* 103* 79  BUN  --   < > 47* 53* 36* 22* 27*  CREATININE  --   < > 12.25* 13.22* 9.93* 7.31* 8.31*  CALCIUM  --   < > 8.5* 8.2* 8.1* 7.8* 8.2*  MG 1.9  --   --   --   --   --   --   AST 13*  --   --   --   --   --   --   ALT 9*  --   --   --   --   --   --   ALKPHOS 72  --   --   --   --   --   --   BILITOT 0.4  --   --   --   --   --   --   < > = values in this interval not displayed. ------------------------------------------------------------------------------------------------------------------ No results for input(s): CHOL, HDL, LDLCALC, TRIG, CHOLHDL, LDLDIRECT in the last 72 hours.  No results found for: HGBA1C ------------------------------------------------------------------------------------------------------------------ No results for input(s): TSH, T4TOTAL, T3FREE, THYROIDAB in the last 72 hours.  Invalid input(s): FREET3 ------------------------------------------------------------------------------------------------------------------ No results for input(s): VITAMINB12, FOLATE, FERRITIN, TIBC, IRON, RETICCTPCT in the last 72 hours.  Coagulation profile  Recent Labs Lab 05/10/16 1156 05/11/16 0500  INR 1.40 1.46    No results for input(s): DDIMER in the last 72 hours.  Cardiac Enzymes No results for input(s): CKMB, TROPONINI, MYOGLOBIN in the last 168 hours.  Invalid input(s):  CK ------------------------------------------------------------------------------------------------------------------ No results found for: BNP  Inpatient Medications  Scheduled Meds: . [MAR Hold] sodium chloride   Intravenous Once  . [MAR Hold] feeding supplement (NEPRO CARB STEADY)  237 mL Oral BID BM  . [MAR Hold] multivitamin  1 tablet Oral QHS  . povidone-iodine  2 application  Topical Once  . [MAR Hold] vitamin B-12  1,000 mcg Oral Daily   Continuous Infusions: . sodium chloride    . sodium chloride     PRN Meds:.  Micro Results Recent Results (from the past 240 hour(s))  Surgical pcr screen     Status: None   Collection Time: 05/14/16  1:00 AM  Result Value Ref Range Status   MRSA, PCR NEGATIVE NEGATIVE Final   Staphylococcus aureus NEGATIVE NEGATIVE Final    Comment:        The Xpert SA Assay (FDA approved for NASAL specimens in patients over 32 years of age), is one component of a comprehensive surveillance program.  Test performance has been validated by [facility] for patients greater than or equal to 30 year old. It is not intended to diagnose infection nor to guide or monitor treatment.     Radiology Reports Dg Skull 1-3 Views  05/11/2016  CLINICAL DATA:  Myeloma EXAM: SKULL - 1-3 VIEW COMPARISON:  None. FINDINGS: Frontal view shows 13 mm lucency in the parasagittal left skull. On the lateral view approximately 10 round or oval lucencies are identified, the largest measuring 14 mm in maximal diameter. There are also lucencies in both mandibles. IMPRESSION: Multiple radiolucencies consistent with myeloma. Electronically Signed   By: Skipper Cliche M.D.   On: 05/11/2016 10:42   Dg Chest 2 View  05/10/2016  CLINICAL DATA:  Congestive heart failure. EXAM: CHEST  2 VIEW COMPARISON:  None. FINDINGS: The heart size and mediastinal contours are within normal limits. No pneumothorax or pleural effusion is noted. Right lung is clear. Density is seen in left lower lobe  which may represent either pleural-based mass or possibly expansile lesion within left posterior rib. The visualized skeletal structures are unremarkable. IMPRESSION: Left lower lobe density is noted representing pleural-based mass or expansile lesion within left posterior rib. CT scan of the chest is recommended to evaluate for possible neoplasm or malignancy. Electronically Signed   By: Marijo Conception, M.D.   On: 05/10/2016 13:08   Dg Lumbar Spine 2-3 Views  05/10/2016  CLINICAL DATA:  Lower lumbar pain radiating down the left leg. Injury in February 2017. EXAM: LUMBAR SPINE - 2-3 VIEW COMPARISON:  None. FINDINGS: Transitional S1 vertebra. Abnormal lucency in the left L5 vertebra appreciable on the frontal projection, potentially with some slight compression in this vicinity, but with lucency out of proportion to the compression. Irregular margins of the cortex of the S1 vertebra on the lateral projection. Anterior wedge compression fracture at L3 with 30% loss of vertebral body height. Aortoiliac atherosclerotic vascular disease. IMPRESSION: 1. Compression fractures at L3 and L5, with abnormal lucency in the left side of the L5 vertebral body raising concern for multiple myeloma or lytic metastatic disease from a radiographic standpoint. MRI could be utilized for further characterization if clinically warranted. 2. There is also cortical thickening and irregularity of the S1 vertebra, underlying malignancy not excluded at S 1. 3. 30% compression fracture at L 3, age indeterminate. 4. Lumbar spondylosis. 5.  Aortoiliac atherosclerotic vascular disease. Electronically Signed   By: Van Clines M.D.   On: 05/10/2016 16:48   US Renal  05/10/2016  CLINICAL DATA:  Acute kidney injury. EXAM: RENAL / URINARY TRACT ULTRASOUND COMPLETE COMPARISON:  None. FINDINGS: Right Kidney: Length: 11.4 cm. Echogenicity within normal limits. No mass or hydronephrosis visualized. Left Kidney: Length: 11.6 cm. Echogenicity  within normal limits. There is a 2.3 x 2.1 x 2.1 cm cyst in  the upper pole. No fall with mass or hydronephrosis visualized. Bladder: Appears normal for degree of bladder distention. IMPRESSION: 1. Left renal cyst.  Otherwise unremarkable renal ultrasound. 2. No obstructive uropathy. Electronically Signed   By: Jeb Levering M.D.   On: 05/10/2016 04:29   Pelvis Portable  05/14/2016  CLINICAL DATA:  Left IM nail.  Postop pain. EXAM: PORTABLE PELVIS 1-2 VIEWS COMPARISON:  None. FINDINGS: Partially images the hardware within the proximal left femur. Hip joints and SI joints are symmetric and unremarkable. No acute fracture, subluxation or dislocation. IMPRESSION: No acute bony abnormality. Electronically Signed   By: Rolm Baptise M.D.   On: 05/14/2016 15:37   Ir Fluoro Guide Cv Line Right  05/12/2016  INDICATION: 71 year old with renal failure.  Catheter needed for hemodialysis. EXAM: FLUOROSCOPIC AND ULTRASOUND GUIDED PLACEMENT OF A TUNNELED DIALYSIS CATHETER Physician: Stephan Minister. Anselm Pancoast, MD MEDICATIONS: Vancomycin 1 g; The antibiotic was administered within an appropriate time interval prior to skin puncture. ANESTHESIA/SEDATION: Versed 1.5 mg IV; Fentanyl 50 mcg IV; Moderate Sedation Time:  34 The patient was continuously monitored during the procedure by the interventional radiology nurse under my direct supervision. FLUOROSCOPY TIME:  Fluoroscopy Time: 36 seconds, 4 mGy COMPLICATIONS: None immediate. PROCEDURE: Informed consent was obtained for placement of a tunneled dialysis catheter. The patient was placed supine on the interventional table. Ultrasound confirmed a patent right internal jugular vein. Ultrasound images were obtained for documentation. The right side of the neck was prepped and draped in a sterile fashion. The right side of the neck was anesthetized with 1% lidocaine. Maximal barrier sterile technique was utilized including caps, mask, sterile gowns, sterile gloves, sterile drape, hand hygiene  and skin antiseptic. A small incision was made with #11 blade scalpel. A 21 gauge needle directed into the right internal jugular vein with ultrasound guidance. A micropuncture dilator set was placed. A 19 cm tip to cuff Palindrome catheter was selected. The skin below the right clavicle was anesthetized and a small incision was made with an #11 blade scalpel. A subcutaneous tunnel was formed to the vein dermatotomy site. The catheter was brought through the tunnel. The vein dermatotomy site was dilated to accommodate a peel-away sheath. The catheter was placed through the peel-away sheath and directed into the central venous structures. The tip of the catheter was placed at the superior cavoatrial junction with fluoroscopy. Fluoroscopic images were obtained for documentation. Both lumens were found to aspirate and flush well. The proper amount of heparin was flushed in both lumens. The vein dermatotomy site was closed using a single layer of absorbable suture and Dermabond. The catheter was secured to the skin using Prolene suture. Fluoroscopic and ultrasound images were taken and saved for documentation. IMPRESSION: Successful placement of a right jugular tunneled dialysis catheter using ultrasound and fluoroscopic guidance. Electronically Signed   By: Markus Daft M.D.   On: 05/12/2016 06:47   Ir US Guide Vasc Access Right  05/12/2016  INDICATION: 70 year old with renal failure.  Catheter needed for hemodialysis. EXAM: FLUOROSCOPIC AND ULTRASOUND GUIDED PLACEMENT OF A TUNNELED DIALYSIS CATHETER Physician: Stephan Minister. Anselm Pancoast, MD MEDICATIONS: Vancomycin 1 g; The antibiotic was administered within an appropriate time interval prior to skin puncture. ANESTHESIA/SEDATION: Versed 1.5 mg IV; Fentanyl 50 mcg IV; Moderate Sedation Time:  34 The patient was continuously monitored during the procedure by the interventional radiology nurse under my direct supervision. FLUOROSCOPY TIME:  Fluoroscopy Time: 36 seconds, 4 mGy  COMPLICATIONS: None immediate. PROCEDURE: Informed consent was obtained for  placement of a tunneled dialysis catheter. The patient was placed supine on the interventional table. Ultrasound confirmed a patent right internal jugular vein. Ultrasound images were obtained for documentation. The right side of the neck was prepped and draped in a sterile fashion. The right side of the neck was anesthetized with 1% lidocaine. Maximal barrier sterile technique was utilized including caps, mask, sterile gowns, sterile gloves, sterile drape, hand hygiene and skin antiseptic. A small incision was made with #11 blade scalpel. A 21 gauge needle directed into the right internal jugular vein with ultrasound guidance. A micropuncture dilator set was placed. A 19 cm tip to cuff Palindrome catheter was selected. The skin below the right clavicle was anesthetized and a small incision was made with an #11 blade scalpel. A subcutaneous tunnel was formed to the vein dermatotomy site. The catheter was brought through the tunnel. The vein dermatotomy site was dilated to accommodate a peel-away sheath. The catheter was placed through the peel-away sheath and directed into the central venous structures. The tip of the catheter was placed at the superior cavoatrial junction with fluoroscopy. Fluoroscopic images were obtained for documentation. Both lumens were found to aspirate and flush well. The proper amount of heparin was flushed in both lumens. The vein dermatotomy site was closed using a single layer of absorbable suture and Dermabond. The catheter was secured to the skin using Prolene suture. Fluoroscopic and ultrasound images were taken and saved for documentation. IMPRESSION: Successful placement of a right jugular tunneled dialysis catheter using ultrasound and fluoroscopic guidance. Electronically Signed   By: Markus Daft M.D.   On: 05/12/2016 06:47   Ir US Guide Bx Asp/drain  05/12/2016  INDICATION: 70 year old with renal  failure.  Request for renal biopsy. EXAM: ULTRASOUND-GUIDED RENAL BIOPSY MEDICATIONS: None. ANESTHESIA/SEDATION: Patient was monitored by radiology nurse throughout the procedure. Sedation was continued from the prior hemodialysis catheter placement. FLUOROSCOPY TIME:  None COMPLICATIONS: None immediate. PROCEDURE: Informed written consent was obtained from the patient after a thorough discussion of the procedural risks, benefits and alternatives. All questions were addressed. Maximal Sterile Barrier Technique was utilized including caps, mask, sterile gowns, sterile gloves, sterile drape, hand hygiene and skin antiseptic. A timeout was performed prior to the initiation of the procedure. Patient was placed prone. Both kidneys were evaluated with ultrasound. The right kidney was selected for biopsy. The right flank was prepped with chlorhexidine and a sterile field was created. Skin was anesthetized with 1% lidocaine. Using ultrasound guidance, 16 gauge core biopsy was obtained from the right kidney lower pole. Specimen placed in saline. A second ultrasound-guided core biopsy was obtained from the lower pole. Specimen was placed in saline. These samples were felt to be adequate. Bandage placed over the puncture site. FINDINGS: Two core biopsies obtained from the right kidney lower pole. Small perinephric hematoma identified following the procedure. This hematoma did not appear to be expanding based on post biopsy visualization. IMPRESSION: Ultrasound-guided core biopsies of the right kidney lower pole. Electronically Signed   By: Markus Daft M.D.   On: 05/12/2016 06:54   Dg Bone Survey Met  05/12/2016  CLINICAL DATA:  Back pain and weakness. Evaluate for multiple myeloma and bone cancer. EXAM: METASTATIC BONE SURVEY COMPARISON:  05/10/2016 FINDINGS: Patient is edentulous. Subtle lucent areas in the calvarium are suspicious. Right jugular dialysis catheter tip in the lower SVC. No suspicious lesions in the upper  extremities. Multilevel degenerativee facet disease in the cervical spine. Normal alignment of cervical spine. Prevertebral soft tissues  are normal. Normal alignment of the thoracic spine. There appears to be 6 non rib-bearing vertebral bodies. Assuming that S1 is a transitional vertebral body, there is a suspicious lucent lesion along the left side of the L5 vertebral body and pedicle. Again noted is mild vertebral body height loss at L5. There is a stable superior compression deformity of the L3 vertebral body. Concern expansion of the left L1 pedicle. Cannot exclude a lesion at this location. Again noted is an expansile lesion involving the left eighth rib. This is better visualized on prior chest radiograph. Pelvic radiograph demonstrate a large lucent lesion involving the left iliac wing. This lesion measures up to 11 cm. Heterogeneous area of lucency in the midshaft of the left femur is concerning for a lesion. There is cortical thinning in this area. No suspicious lesions in the right lower extremity. IMPRESSION: Multiple bone lesions are suggestive for metastatic bone disease and suspicious for multiple myeloma. Largest lesion is involving the left iliac wing and this would be the most amenable to biopsy. There is also an expansile lesion involving the left eighth rib and a lucent lesion involving the L5 left pedicle. Question a lesion of the left L1 pedicle. Probable small calvarial lesions. Suspicious lucency involving the midshaft of the left femur with cortical thinning. This is concerning for a metastatic bone lesion and would be at risk for a pathologic fracture. L3 compression fracture.  Questionable compression fracture at L5. These results were called by telephone at the time of interpretation on 05/12/2016 at 4:47 pm to Dr. Clementeen Graham, who verbally acknowledged these results. Electronically Signed   By: Markus Daft M.D.   On: 05/12/2016 16:47   Dg C-arm 61-120 Min  05/14/2016  CLINICAL DATA:  Elective  surgery.  Left IM nail. EXAM: DG C-ARM 61-120 MIN; LEFT FEMUR 2 VIEWS COMPARISON:  05/13/2016 FINDINGS: Placement of IM nail within the left femur, bridging the permeative lesions seen on prior plain films. Hip screw in place as well. No hardware or bony complicating feature. IMPRESSION: Placement of hip screw and left femoral IM nail as above. No visible complicating feature. Electronically Signed   By: Rolm Baptise M.D.   On: 05/14/2016 13:45   Dg Femur Min 2 Views Left  05/14/2016  CLINICAL DATA:  Elective surgery.  Left IM nail. EXAM: DG C-ARM 61-120 MIN; LEFT FEMUR 2 VIEWS COMPARISON:  05/13/2016 FINDINGS: Placement of IM nail within the left femur, bridging the permeative lesions seen on prior plain films. Hip screw in place as well. No hardware or bony complicating feature. IMPRESSION: Placement of hip screw and left femoral IM nail as above. No visible complicating feature. Electronically Signed   By: Rolm Baptise M.D.   On: 05/14/2016 13:45   Dg Femur Min 2 Views Left  05/13/2016  CLINICAL DATA:  Left femur pain anteriorly from the left hip to the knee. EXAM: LEFT FEMUR 2 VIEWS COMPARISON:  Bone survey 05/12/2016 FINDINGS: There is permeative lucency within the midshaft of the femur with ill-defined margins and cortical involvement as described on yesterday's study. This lesion involves a length of at least 6 cm. No displaced fracture is identified. The left hip and left knee are located. No soft tissue abnormality is seen. IMPRESSION: Permeative left midshaft femur lesion concerning for malignancy as described on yesterday's study. No fracture identified. Electronically Signed   By: Logan Bores M.D.   On: 05/13/2016 11:40   Dg Femur Port Min 2 Views Left  05/14/2016  CLINICAL  DATA:  IM nail placement.  Postop pain. EXAM: LEFT FEMUR PORTABLE 2 VIEWS COMPARISON:  05/13/2016 FINDINGS: Placement of intra medullary nail and femoral neck screw. The IM nail crosses the herniated area within the mid shaft.  There is a cortical defect noted laterally, new since prior study, possibly biopsy site. No additional interval change since prior study. IMPRESSION: Intra medullary nail placed across the permeative lesion in the mid left femur. Cortical defect laterally presumably represents biopsy site. Electronically Signed   By: Rolm Baptise M.D.   On: 05/14/2016 15:38    Time Spent in minutes  25   Louellen Molder M.D on 05/14/2016 at 5:21 PM  Between 7am to 7pm - Pager - [phone]  After 7pm go to [email or web address] - password [facility]  [facility] -  Office  [phone]

## 2016-05-14 NOTE — H&P (View-Only) (Signed)
ORTHOPAEDIC CONSULTATION  REQUESTING PHYSICIAN: Louellen Molder, MD  PCP:  No PCP Per Patient  Chief Complaint: evaluate left femur  HPI: Thomas [name] is a 70 y.o. male who complains of left thigh pain with attempted weightbearing. He also has chronic low back pain. He was previously told that he has a L3 compression fracture. He came to the ER a couple days ago for acute renal failure and nausea. He received dialysis for the first time ever yesterday. He was found on admission to be anemic. 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 The working diagnosis is multiple myeloma, but at this point in time, there is no biopsy to prove this. Orthopedic consultation was placed for management of impending pathologicleft femur fracture. The hospitalist tells me that he is on the schedule for a CT-guided  Biopsy of the left iliac wing on Monday. He is scheduled for dialysis later today and is supposed to be given 2 units of packed red blood cells. He does complain of left femur pain with attempted weightbearing.  Past Medical History  Diagnosis Date  . Compression fracture    Past Surgical History  Procedure Laterality Date  . Mouth surgery     Social History   Social History  . Marital Status: Married    Spouse Name: N/A  . Number of Children: N/A  . Years of Education: N/A   Social History Main Topics  . Smoking status: Former Research scientist (life sciences)  . Smokeless tobacco: None  . Alcohol Use: No  . Drug Use: No  . Sexual Activity: Not Asked   Other Topics Concern  . None   Social History Narrative   Family History  Problem Relation Age of Onset  . Diabetes Father    Allergies  Allergen Reactions  . Codeine Other (See Comments)    "MAKES ME JUMPY"  . Penicillins     Has patient had a PCN reaction causing immediate rash, facial/tongue/throat  swelling, SOB or lightheadedness with hypotension: Yes Has patient had a PCN reaction causing severe rash involving mucus membranes or skin necrosis: Yes Has patient had a PCN reaction that required hospitalization: No Has patient had a PCN reaction occurring within the last 10 years: No If all of the above answers are "NO", then may proceed with Cephalosporin use.    Prior to Admission medications   Not on File   Ir Fluoro Guide Cv Line Right  05/12/2016  INDICATION: 70 year old with renal failure.  Catheter needed for hemodialysis. EXAM: FLUOROSCOPIC AND ULTRASOUND GUIDED PLACEMENT OF A TUNNELED DIALYSIS CATHETER Physician: Stephan Minister. Anselm Pancoast, MD MEDICATIONS: Vancomycin 1 g; The antibiotic was administered within an appropriate time interval prior to skin puncture. ANESTHESIA/SEDATION: Versed 1.5 mg IV; Fentanyl 50 mcg IV; Moderate Sedation Time:  34 The patient was continuously monitored during the procedure by the interventional radiology nurse under my direct supervision. FLUOROSCOPY TIME:  Fluoroscopy Time: 36 seconds, 4 mGy COMPLICATIONS: None immediate. PROCEDURE: Informed consent was obtained for placement of a tunneled dialysis catheter. The patient was placed supine on the interventional table. Ultrasound confirmed a patent right internal jugular vein. Ultrasound images were obtained for documentation. The right side of the neck was prepped and draped in a sterile fashion. The right side of the neck was anesthetized with 1% lidocaine. Maximal barrier sterile technique was utilized including caps, mask, sterile gowns, sterile gloves,  sterile drape, hand hygiene and skin antiseptic. A small incision was made with #11 blade scalpel. A 21 gauge needle directed into the right internal jugular vein with ultrasound guidance. A micropuncture dilator set was placed. A 19 cm tip to cuff Palindrome catheter was selected. The skin below the right clavicle was anesthetized and a small incision was made with an #11  blade scalpel. A subcutaneous tunnel was formed to the vein dermatotomy site. The catheter was brought through the tunnel. The vein dermatotomy site was dilated to accommodate a peel-away sheath. The catheter was placed through the peel-away sheath and directed into the central venous structures. The tip of the catheter was placed at the superior cavoatrial junction with fluoroscopy. Fluoroscopic images were obtained for documentation. Both lumens were found to aspirate and flush well. The proper amount of heparin was flushed in both lumens. The vein dermatotomy site was closed using a single layer of absorbable suture and Dermabond. The catheter was secured to the skin using Prolene suture. Fluoroscopic and ultrasound images were taken and saved for documentation. IMPRESSION: Successful placement of a right jugular tunneled dialysis catheter using ultrasound and fluoroscopic guidance. Electronically Signed   By: Markus Daft M.D.   On: 05/12/2016 06:47   Ir US Guide Vasc Access Right  05/12/2016  INDICATION: 70 year old with renal failure.  Catheter needed for hemodialysis. EXAM: FLUOROSCOPIC AND ULTRASOUND GUIDED PLACEMENT OF A TUNNELED DIALYSIS CATHETER Physician: Stephan Minister. Anselm Pancoast, MD MEDICATIONS: Vancomycin 1 g; The antibiotic was administered within an appropriate time interval prior to skin puncture. ANESTHESIA/SEDATION: Versed 1.5 mg IV; Fentanyl 50 mcg IV; Moderate Sedation Time:  34 The patient was continuously monitored during the procedure by the interventional radiology nurse under my direct supervision. FLUOROSCOPY TIME:  Fluoroscopy Time: 36 seconds, 4 mGy COMPLICATIONS: None immediate. PROCEDURE: Informed consent was obtained for placement of a tunneled dialysis catheter. The patient was placed supine on the interventional table. Ultrasound confirmed a patent right internal jugular vein. Ultrasound images were obtained for documentation. The right side of the neck was prepped and draped in a sterile  fashion. The right side of the neck was anesthetized with 1% lidocaine. Maximal barrier sterile technique was utilized including caps, mask, sterile gowns, sterile gloves, sterile drape, hand hygiene and skin antiseptic. A small incision was made with #11 blade scalpel. A 21 gauge needle directed into the right internal jugular vein with ultrasound guidance. A micropuncture dilator set was placed. A 19 cm tip to cuff Palindrome catheter was selected. The skin below the right clavicle was anesthetized and a small incision was made with an #11 blade scalpel. A subcutaneous tunnel was formed to the vein dermatotomy site. The catheter was brought through the tunnel. The vein dermatotomy site was dilated to accommodate a peel-away sheath. The catheter was placed through the peel-away sheath and directed into the central venous structures. The tip of the catheter was placed at the superior cavoatrial junction with fluoroscopy. Fluoroscopic images were obtained for documentation. Both lumens were found to aspirate and flush well. The proper amount of heparin was flushed in both lumens. The vein dermatotomy site was closed using a single layer of absorbable suture and Dermabond. The catheter was secured to the skin using Prolene suture. Fluoroscopic and ultrasound images were taken and saved for documentation. IMPRESSION: Successful placement of a right jugular tunneled dialysis catheter using ultrasound and fluoroscopic guidance. Electronically Signed   By: Markus Daft M.D.   On: 05/12/2016 06:47   Ir US Guide Bx  Asp/drain  05/12/2016  INDICATION: 70 year old with renal failure.  Request for renal biopsy. EXAM: ULTRASOUND-GUIDED RENAL BIOPSY MEDICATIONS: None. ANESTHESIA/SEDATION: Patient was monitored by radiology nurse throughout the procedure. Sedation was continued from the prior hemodialysis catheter placement. FLUOROSCOPY TIME:  None COMPLICATIONS: None immediate. PROCEDURE: Informed written consent was obtained  from the patient after a thorough discussion of the procedural risks, benefits and alternatives. All questions were addressed. Maximal Sterile Barrier Technique was utilized including caps, mask, sterile gowns, sterile gloves, sterile drape, hand hygiene and skin antiseptic. A timeout was performed prior to the initiation of the procedure. Patient was placed prone. Both kidneys were evaluated with ultrasound. The right kidney was selected for biopsy. The right flank was prepped with chlorhexidine and a sterile field was created. Skin was anesthetized with 1% lidocaine. Using ultrasound guidance, 16 gauge core biopsy was obtained from the right kidney lower pole. Specimen placed in saline. A second ultrasound-guided core biopsy was obtained from the lower pole. Specimen was placed in saline. These samples were felt to be adequate. Bandage placed over the puncture site. FINDINGS: Two core biopsies obtained from the right kidney lower pole. Small perinephric hematoma identified following the procedure. This hematoma did not appear to be expanding based on post biopsy visualization. IMPRESSION: Ultrasound-guided core biopsies of the right kidney lower pole. Electronically Signed   By: Markus Daft M.D.   On: 05/12/2016 06:54   Dg Bone Survey Met  05/12/2016  CLINICAL DATA:  Back pain and weakness. Evaluate for multiple myeloma and bone cancer. EXAM: METASTATIC BONE SURVEY COMPARISON:  05/10/2016 FINDINGS: Patient is edentulous. Subtle lucent areas in the calvarium are suspicious. Right jugular dialysis catheter tip in the lower SVC. No suspicious lesions in the upper extremities. Multilevel degenerativee facet disease in the cervical spine. Normal alignment of cervical spine. Prevertebral soft tissues are normal. Normal alignment of the thoracic spine. There appears to be 6 non rib-bearing vertebral bodies. Assuming that S1 is a transitional vertebral body, there is a suspicious lucent lesion along the left side of the  L5 vertebral body and pedicle. Again noted is mild vertebral body height loss at L5. There is a stable superior compression deformity of the L3 vertebral body. Concern expansion of the left L1 pedicle. Cannot exclude a lesion at this location. Again noted is an expansile lesion involving the left eighth rib. This is better visualized on prior chest radiograph. Pelvic radiograph demonstrate a large lucent lesion involving the left iliac wing. This lesion measures up to 11 cm. Heterogeneous area of lucency in the midshaft of the left femur is concerning for a lesion. There is cortical thinning in this area. No suspicious lesions in the right lower extremity. IMPRESSION: Multiple bone lesions are suggestive for metastatic bone disease and suspicious for multiple myeloma. Largest lesion is involving the left iliac wing and this would be the most amenable to biopsy. There is also an expansile lesion involving the left eighth rib and a lucent lesion involving the L5 left pedicle. Question a lesion of the left L1 pedicle. Probable small calvarial lesions. Suspicious lucency involving the midshaft of the left femur with cortical thinning. This is concerning for a metastatic bone lesion and would be at risk for a pathologic fracture. L3 compression fracture.  Questionable compression fracture at L5. These results were called by telephone at the time of interpretation on 05/12/2016 at 4:47 pm to Dr. Clementeen Graham, who verbally acknowledged these results. Electronically Signed   By: Scherrie Gerlach.D.  On: 05/12/2016 16:47    Positive ROS: All other systems have been reviewed and were otherwise negative with the exception of those mentioned in the HPI and as above.  Physical Exam: General: Alert, no acute distress Cardiovascular: No pedal edema Respiratory: No cyanosis, no use of accessory musculature GI: No organomegaly, abdomen is soft and non-tender Skin: No lesions in the area of chief complaint Neurologic: Sensation  intact distally Psychiatric: Patient is competent for consent with normal mood and affect Lymphatic: No axillary or cervical lymphadenopathy  MUSCULOSKELETAL: Focused examination of the left lower extremity reveals no skin wounds or lesions. The patient is very thin. No significant pain with logrolling of the hip. He has positive motor function dorsiflexion, plantar flexion, and great toe extension. He has palpable pedal pulses. He reports intact sensation to light touch in all distributions.  Assessment: Impending pathologic fracture left femur Multiple lytic skeletal lesions, workup consistent with multiple myeloma  Plan: I had a lengthy discussion with the patient and his family at the bedside. He is at significant risk of pathologic fracture of the left femur.  I would recommend that we take him to the operating room for open biopsy of the left femur with frozen sections to be evaluated by pathology. We would then proceed with prophylactic nailing of the left femur. He will need radiation therapy to the left femur postoperatively.This would save him from needing a CT-guided biopsy on Monday. We discussed the risks, benefits, and alternatives. He is going to have dialysis today and be transfused 2 units of packed red blood cells. He will be nothing by mouth after midnight. We'll place him on the surgery schedule for tomorrow morning. All questions were solicited and answered to their satisfaction.    Hutchinson Isenberg, Horald Pollen, MD Cell [phone]    05/13/2016 10:41 AM

## 2016-05-14 NOTE — Progress Notes (Signed)
  [facility] Progress Note   Subjective: In PACU, postop he rec'd 2u blood and 2 pack plts and 1 albumin and neo gtt which has just been weaned off.  Patient alert, no c/o's.    Filed Vitals:   05/14/16 1644 05/14/16 1654 05/14/16 1704 05/14/16 1714  BP: 103/56 96/55 99/60  107/61  Pulse: 80 77 73 74  Temp:      TempSrc:      Resp: 18 19 17 21   Height:      Weight:      SpO2: 100% 100% 100% 100%    Inpatient medications: . [MAR Hold] sodium chloride   Intravenous Once  . [MAR Hold] feeding supplement (NEPRO CARB STEADY)  237 mL Oral BID BM  . HYDROmorphone      . [MAR Hold] multivitamin  1 tablet Oral QHS  . povidone-iodine  2 application Topical Once  . [MAR Hold] vitamin B-12  1,000 mcg Oral Daily   . sodium chloride    . sodium chloride     [MAR Hold] acetaminophen, HYDROmorphone (DILAUDID) injection, [MAR Hold] ondansetron, oxyCODONE **OR** oxyCODONE  Exam: No distress, calm No jvd Chest clear bilat RRR no mrg Abd soft ntnd No LE edmea  L upper leg wrapped Neuro nf, Ox 3  Assessment: 1 AKI/CKD Hd x 2. Follow chem . Renal Bx result pending 2 anemia on esa, has MM 3 Comp fx , sp ORIF Punched out lesions in skull Prob MM 4 low ptlt prob MM  P HD mon, cont esa, await Renal and BM bx    [name] MD [facility] pager [phone]    cell [phone] 05/14/2016, 6:08 PM    Recent Labs Lab 05/12/16 0913 05/13/16 1223 05/13/16 1841 05/14/16 0517  NA 129* 128* 127* 129*  K 5.3* 4.4 4.1 4.4  CL 100* 97* 95* 97*  CO2 17* 23 24 25   GLUCOSE 111* 112* 103* 79  BUN 53* 36* 22* 27*  CREATININE 13.22* 9.93* 7.31* 8.31*  CALCIUM 8.2* 8.1* 7.8* 8.2*  PHOS 6.0* 5.1*  --   --     Recent Labs Lab 05/10/16 0310 05/12/16 0913 05/13/16 1223  AST 13*  --   --   ALT 9*  --   --   ALKPHOS 72  --   --   BILITOT 0.4  --   --   PROT 9.7*  --   --   ALBUMIN 2.8* 2.1* 1.9*    Recent Labs Lab 05/09/16 2144  05/13/16 1223  05/13/16 1841 05/14/16 0517  WBC 6.2  < > 3.3* 4.1 4.4  NEUTROABS 4.4  --   --   --   --   HGB 9.2*  < > 7.5* 9.8* 9.7*  HCT 26.5*  < > 22.0* 29.0* 28.5*  MCV 85.8  < > 86.6 85.3 85.3  PLT 105*  < > 53* 59* 56*  < > = values in this interval not displayed. Iron/TIBC/Ferritin/ %Sat    Component Value Date/Time   IRON 64 05/10/2016 1156   TIBC 167* 05/10/2016 1156   IRONPCTSAT 38 05/10/2016 1156

## 2016-05-15 ENCOUNTER — Encounter (HOSPITAL_COMMUNITY): Payer: Self-pay | Admitting: Orthopedic Surgery

## 2016-05-15 ENCOUNTER — Inpatient Hospital Stay (HOSPITAL_COMMUNITY): Payer: PPO

## 2016-05-15 LAB — BASIC METABOLIC PANEL
Anion gap: 10 (ref 5–15)
BUN: 39 mg/dL — ABNORMAL HIGH (ref 6–20)
CALCIUM: 7.7 mg/dL — AB (ref 8.9–10.3)
CHLORIDE: 98 mmol/L — AB (ref 101–111)
CO2: 22 mmol/L (ref 22–32)
CREATININE: 9.26 mg/dL — AB (ref 0.61–1.24)
GFR calc non Af Amer: 5 mL/min — ABNORMAL LOW (ref >60)
GFR, EST AFRICAN AMERICAN: 6 mL/min — AB (ref >60)
GLUCOSE: 110 mg/dL — AB (ref 65–99)
Potassium: 5.3 mmol/L — ABNORMAL HIGH (ref 3.5–5.1)
Sodium: 130 mmol/L — ABNORMAL LOW (ref 135–145)

## 2016-05-15 LAB — TYPE AND SCREEN
ABO/RH(D): A POS
Antibody Screen: NEGATIVE
UNIT DIVISION: 0
UNIT DIVISION: 0
UNIT DIVISION: 0
UNIT DIVISION: 0
UNIT DIVISION: 0
Unit division: 0
Unit division: 0

## 2016-05-15 LAB — CBC
HEMATOCRIT: 18.2 % — AB (ref 39.0–52.0)
HEMOGLOBIN: 6.5 g/dL — AB (ref 13.0–17.0)
MCH: 30.2 pg (ref 26.0–34.0)
MCHC: 35.7 g/dL (ref 30.0–36.0)
MCV: 84.7 fL (ref 78.0–100.0)
Platelets: 95 10*3/uL — ABNORMAL LOW (ref 150–400)
RBC: 2.15 MIL/uL — ABNORMAL LOW (ref 4.22–5.81)
RDW: 13.2 % (ref 11.5–15.5)
WBC: 5.9 10*3/uL (ref 4.0–10.5)

## 2016-05-15 LAB — UIFE/LIGHT CHAINS/TP QN, 24-HR UR
% BETA, Urine: 14.8 %
ALPHA 1 URINE: 1.6 %
ALPHA 2 UR: 4.4 %
Albumin, U: 11.1 %
FREE LAMBDA LT CHAINS, UR: 16 mg/L — AB (ref 0.24–6.66)
Free Kappa/Lambda Ratio: 2850 — ABNORMAL HIGH (ref 2.04–10.37)
GAMMA GLOBULIN URINE: 68 %
M-SPIKE %, URINE: 62.1 % — AB
M-SPIKE, MG/24 HR: 390 mg/(24.h) — AB
TOTAL PROTEIN, URINE-UPE24: 502.3 mg/dL
Time: 24 hours
Total Protein, Urine-Ur/day: 628 mg/24 hr — ABNORMAL HIGH (ref 30–150)
VOLUME, URINE-UPE24: 125 mL

## 2016-05-15 LAB — PREPARE PLATELET PHERESIS
UNIT DIVISION: 0
UNIT DIVISION: 0

## 2016-05-15 LAB — PREPARE RBC (CROSSMATCH)

## 2016-05-15 MED ORDER — LORAZEPAM 2 MG/ML IJ SOLN
0.5000 mg | Freq: Once | INTRAMUSCULAR | Status: AC
  Administered 2016-05-15: 0.5 mg via INTRAVENOUS
  Filled 2016-05-15: qty 1

## 2016-05-15 MED ORDER — DARBEPOETIN ALFA 100 MCG/0.5ML IJ SOSY
100.0000 ug | PREFILLED_SYRINGE | INTRAMUSCULAR | Status: DC
  Filled 2016-05-15 (×2): qty 0.5

## 2016-05-15 MED ORDER — SODIUM CHLORIDE 0.9 % IV SOLN: Freq: Once | INTRAVENOUS | Status: DC

## 2016-05-15 MED ORDER — DARBEPOETIN ALFA 100 MCG/0.5ML IJ SOSY
100.0000 ug | PREFILLED_SYRINGE | INTRAMUSCULAR | Status: DC
  Filled 2016-05-15: qty 0.5

## 2016-05-15 NOTE — Progress Notes (Signed)
PT Cancellation Note  Patient Details Name: Thomas [name] MRN: [id number] DOB: Jun 23, 1946   Cancelled Treatment:    Reason Eval/Treat Not Completed: Patient at procedure or test/unavailable   Currently in HD;  Will follow up later today as time allows;  Otherwise, will follow up for PT tomorrow;   Thank you,  Roney Marion, [place] Pager [phone] Office [phone]     Roney Marion [facility] 05/15/2016, 8:14 AM

## 2016-05-15 NOTE — Consult Note (Signed)
Chief Complaint: Patient was seen in consultation today for bone marrow biopsy Chief Complaint  Patient presents with  . low hgb    at the request of Dr Flonnie Overman Dhungel  Referring Physician(s): Dr Flonnie Overman Dhungel  Supervising Physician: Arne Cleveland  Patient Status: In-pt   History of Present Illness: Thomas [name] is a 70 y.o. male   Presented to ED with 1 mo hx N/V; wt loss Had been using NSAIDs for weeks for low back pain Work up revealed Creatinine and BUN levels high Started on Dialysis Also noted bony lesions Anemia; thrombocytopenia Worrisome for Multiple Myeloma 6/2 bone scan: IMPRESSION: Multiple bone lesions are suggestive for metastatic bone disease and suspicious for multiple myeloma. Largest lesion is involving the left iliac wing and this would be the most amenable to biopsy. There is also an expansile lesion involving the left eighth rib and a lucent lesion involving the L5 left pedicle. Question a lesion of the left L1 pedicle. Probable small calvarial lesions.  Suspicious lucency involving the midshaft of the left femur with cortical thinning. This is concerning for a metastatic bone lesion and would be at risk for a pathologic fracture.  L3 compression fracture. Questionable compression fracture at L5.  Left femur pathologic fx surgery with Dr Lyla Glassing 6/4 Biopsy done at that time Unfortunately, insufficient material per pathology Now request for bone marrow biospy  Past Medical History  Diagnosis Date  . Compression fracture     Past Surgical History  Procedure Laterality Date  . Mouth surgery      Allergies: Codeine and Penicillins  Medications: Prior to Admission medications   Not on File     Family History  Problem Relation Age of Onset  . Diabetes Father     Social History   Social History  . Marital Status: Married    Spouse Name: N/A  . Number of Children: N/A  . Years of Education: N/A   Social History  Main Topics  . Smoking status: Former Research scientist (life sciences)  . Smokeless tobacco: None  . Alcohol Use: No  . Drug Use: No  . Sexual Activity: Not Asked   Other Topics Concern  . None   Social History Narrative     Review of Systems: A 12 point ROS discussed and pertinent positives are indicated in the HPI above.  All other systems are negative.  Review of Systems  Constitutional: Positive for activity change, appetite change and fatigue. Negative for fever.  Respiratory: Negative for cough and shortness of breath.   Gastrointestinal: Negative for abdominal pain.  Neurological: Positive for weakness.  Psychiatric/Behavioral: Negative for behavioral problems and confusion.    Vital Signs: BP 100/67 mmHg  Pulse 100  Temp(Src) 98.3 F (36.8 C) (Oral)  Resp 20  Ht 6' 1"  (1.854 m)  Wt 170 lb 13.7 oz (77.5 kg)  BMI 22.55 kg/m2  SpO2 98%  Physical Exam  Constitutional: He is oriented to person, place, and time.  Cardiovascular: Normal rate and regular rhythm.   Pulmonary/Chest: Effort normal.  Abdominal: Soft.  Musculoskeletal: Normal range of motion.  painful Left leg after surgery  Neurological: He is alert and oriented to person, place, and time.  Skin: Skin is warm and dry.  Psychiatric: He has a normal mood and affect. His behavior is normal. Judgment and thought content normal.  Nursing note and vitals reviewed.   Mallampati Score:  MD Evaluation Airway: WNL Heart: WNL Abdomen: WNL Chest/ Lungs: WNL ASA  Classification: 3 Mallampati/Airway Score: One  Imaging: Dg Skull 1-3 Views  05/11/2016  CLINICAL DATA:  Myeloma EXAM: SKULL - 1-3 VIEW COMPARISON:  None. FINDINGS: Frontal view shows 13 mm lucency in the parasagittal left skull. On the lateral view approximately 10 round or oval lucencies are identified, the largest measuring 14 mm in maximal diameter. There are also lucencies in both mandibles. IMPRESSION: Multiple radiolucencies consistent with myeloma. Electronically  Signed   By: Skipper Cliche M.D.   On: 05/11/2016 10:42   Dg Chest 2 View  05/10/2016  CLINICAL DATA:  Congestive heart failure. EXAM: CHEST  2 VIEW COMPARISON:  None. FINDINGS: The heart size and mediastinal contours are within normal limits. No pneumothorax or pleural effusion is noted. Right lung is clear. Density is seen in left lower lobe which may represent either pleural-based mass or possibly expansile lesion within left posterior rib. The visualized skeletal structures are unremarkable. IMPRESSION: Left lower lobe density is noted representing pleural-based mass or expansile lesion within left posterior rib. CT scan of the chest is recommended to evaluate for possible neoplasm or malignancy. Electronically Signed   By: Marijo Conception, M.D.   On: 05/10/2016 13:08   Dg Lumbar Spine 2-3 Views  05/10/2016  CLINICAL DATA:  Lower lumbar pain radiating down the left leg. Injury in February 2017. EXAM: LUMBAR SPINE - 2-3 VIEW COMPARISON:  None. FINDINGS: Transitional S1 vertebra. Abnormal lucency in the left L5 vertebra appreciable on the frontal projection, potentially with some slight compression in this vicinity, but with lucency out of proportion to the compression. Irregular margins of the cortex of the S1 vertebra on the lateral projection. Anterior wedge compression fracture at L3 with 30% loss of vertebral body height. Aortoiliac atherosclerotic vascular disease. IMPRESSION: 1. Compression fractures at L3 and L5, with abnormal lucency in the left side of the L5 vertebral body raising concern for multiple myeloma or lytic metastatic disease from a radiographic standpoint. MRI could be utilized for further characterization if clinically warranted. 2. There is also cortical thickening and irregularity of the S1 vertebra, underlying malignancy not excluded at S 1. 3. 30% compression fracture at L 3, age indeterminate. 4. Lumbar spondylosis. 5.  Aortoiliac atherosclerotic vascular disease. Electronically  Signed   By: Van Clines M.D.   On: 05/10/2016 16:48   US Renal  05/10/2016  CLINICAL DATA:  Acute kidney injury. EXAM: RENAL / URINARY TRACT ULTRASOUND COMPLETE COMPARISON:  None. FINDINGS: Right Kidney: Length: 11.4 cm. Echogenicity within normal limits. No mass or hydronephrosis visualized. Left Kidney: Length: 11.6 cm. Echogenicity within normal limits. There is a 2.3 x 2.1 x 2.1 cm cyst in the upper pole. No fall with mass or hydronephrosis visualized. Bladder: Appears normal for degree of bladder distention. IMPRESSION: 1. Left renal cyst.  Otherwise unremarkable renal ultrasound. 2. No obstructive uropathy. Electronically Signed   By: Jeb Levering M.D.   On: 05/10/2016 04:29   Pelvis Portable  05/14/2016  CLINICAL DATA:  Left IM nail.  Postop pain. EXAM: PORTABLE PELVIS 1-2 VIEWS COMPARISON:  None. FINDINGS: Partially images the hardware within the proximal left femur. Hip joints and SI joints are symmetric and unremarkable. No acute fracture, subluxation or dislocation. IMPRESSION: No acute bony abnormality. Electronically Signed   By: Rolm Baptise M.D.   On: 05/14/2016 15:37   Ir Fluoro Guide Cv Line Right  05/12/2016  INDICATION: 70 year old with renal failure.  Catheter needed for hemodialysis. EXAM: FLUOROSCOPIC AND ULTRASOUND GUIDED PLACEMENT OF A TUNNELED DIALYSIS CATHETER Physician: Stephan Minister. Anselm Pancoast, MD MEDICATIONS:  Vancomycin 1 g; The antibiotic was administered within an appropriate time interval prior to skin puncture. ANESTHESIA/SEDATION: Versed 1.5 mg IV; Fentanyl 50 mcg IV; Moderate Sedation Time:  34 The patient was continuously monitored during the procedure by the interventional radiology nurse under my direct supervision. FLUOROSCOPY TIME:  Fluoroscopy Time: 36 seconds, 4 mGy COMPLICATIONS: None immediate. PROCEDURE: Informed consent was obtained for placement of a tunneled dialysis catheter. The patient was placed supine on the interventional table. Ultrasound confirmed a  patent right internal jugular vein. Ultrasound images were obtained for documentation. The right side of the neck was prepped and draped in a sterile fashion. The right side of the neck was anesthetized with 1% lidocaine. Maximal barrier sterile technique was utilized including caps, mask, sterile gowns, sterile gloves, sterile drape, hand hygiene and skin antiseptic. A small incision was made with #11 blade scalpel. A 21 gauge needle directed into the right internal jugular vein with ultrasound guidance. A micropuncture dilator set was placed. A 19 cm tip to cuff Palindrome catheter was selected. The skin below the right clavicle was anesthetized and a small incision was made with an #11 blade scalpel. A subcutaneous tunnel was formed to the vein dermatotomy site. The catheter was brought through the tunnel. The vein dermatotomy site was dilated to accommodate a peel-away sheath. The catheter was placed through the peel-away sheath and directed into the central venous structures. The tip of the catheter was placed at the superior cavoatrial junction with fluoroscopy. Fluoroscopic images were obtained for documentation. Both lumens were found to aspirate and flush well. The proper amount of heparin was flushed in both lumens. The vein dermatotomy site was closed using a single layer of absorbable suture and Dermabond. The catheter was secured to the skin using Prolene suture. Fluoroscopic and ultrasound images were taken and saved for documentation. IMPRESSION: Successful placement of a right jugular tunneled dialysis catheter using ultrasound and fluoroscopic guidance. Electronically Signed   By: Markus Daft M.D.   On: 05/12/2016 06:47   Ir US Guide Vasc Access Right  05/12/2016  INDICATION: 70 year old with renal failure.  Catheter needed for hemodialysis. EXAM: FLUOROSCOPIC AND ULTRASOUND GUIDED PLACEMENT OF A TUNNELED DIALYSIS CATHETER Physician: Stephan Minister. Anselm Pancoast, MD MEDICATIONS: Vancomycin 1 g; The antibiotic was  administered within an appropriate time interval prior to skin puncture. ANESTHESIA/SEDATION: Versed 1.5 mg IV; Fentanyl 50 mcg IV; Moderate Sedation Time:  34 The patient was continuously monitored during the procedure by the interventional radiology nurse under my direct supervision. FLUOROSCOPY TIME:  Fluoroscopy Time: 36 seconds, 4 mGy COMPLICATIONS: None immediate. PROCEDURE: Informed consent was obtained for placement of a tunneled dialysis catheter. The patient was placed supine on the interventional table. Ultrasound confirmed a patent right internal jugular vein. Ultrasound images were obtained for documentation. The right side of the neck was prepped and draped in a sterile fashion. The right side of the neck was anesthetized with 1% lidocaine. Maximal barrier sterile technique was utilized including caps, mask, sterile gowns, sterile gloves, sterile drape, hand hygiene and skin antiseptic. A small incision was made with #11 blade scalpel. A 21 gauge needle directed into the right internal jugular vein with ultrasound guidance. A micropuncture dilator set was placed. A 19 cm tip to cuff Palindrome catheter was selected. The skin below the right clavicle was anesthetized and a small incision was made with an #11 blade scalpel. A subcutaneous tunnel was formed to the vein dermatotomy site. The catheter was brought through the tunnel. The vein dermatotomy  site was dilated to accommodate a peel-away sheath. The catheter was placed through the peel-away sheath and directed into the central venous structures. The tip of the catheter was placed at the superior cavoatrial junction with fluoroscopy. Fluoroscopic images were obtained for documentation. Both lumens were found to aspirate and flush well. The proper amount of heparin was flushed in both lumens. The vein dermatotomy site was closed using a single layer of absorbable suture and Dermabond. The catheter was secured to the skin using Prolene suture.  Fluoroscopic and ultrasound images were taken and saved for documentation. IMPRESSION: Successful placement of a right jugular tunneled dialysis catheter using ultrasound and fluoroscopic guidance. Electronically Signed   By: Markus Daft M.D.   On: 05/12/2016 06:47   Ir US Guide Bx Asp/drain  05/12/2016  INDICATION: 70 year old with renal failure.  Request for renal biopsy. EXAM: ULTRASOUND-GUIDED RENAL BIOPSY MEDICATIONS: None. ANESTHESIA/SEDATION: Patient was monitored by radiology nurse throughout the procedure. Sedation was continued from the prior hemodialysis catheter placement. FLUOROSCOPY TIME:  None COMPLICATIONS: None immediate. PROCEDURE: Informed written consent was obtained from the patient after a thorough discussion of the procedural risks, benefits and alternatives. All questions were addressed. Maximal Sterile Barrier Technique was utilized including caps, mask, sterile gowns, sterile gloves, sterile drape, hand hygiene and skin antiseptic. A timeout was performed prior to the initiation of the procedure. Patient was placed prone. Both kidneys were evaluated with ultrasound. The right kidney was selected for biopsy. The right flank was prepped with chlorhexidine and a sterile field was created. Skin was anesthetized with 1% lidocaine. Using ultrasound guidance, 16 gauge core biopsy was obtained from the right kidney lower pole. Specimen placed in saline. A second ultrasound-guided core biopsy was obtained from the lower pole. Specimen was placed in saline. These samples were felt to be adequate. Bandage placed over the puncture site. FINDINGS: Two core biopsies obtained from the right kidney lower pole. Small perinephric hematoma identified following the procedure. This hematoma did not appear to be expanding based on post biopsy visualization. IMPRESSION: Ultrasound-guided core biopsies of the right kidney lower pole. Electronically Signed   By: Markus Daft M.D.   On: 05/12/2016 06:54   Dg Bone  Survey Met  05/12/2016  CLINICAL DATA:  Back pain and weakness. Evaluate for multiple myeloma and bone cancer. EXAM: METASTATIC BONE SURVEY COMPARISON:  05/10/2016 FINDINGS: Patient is edentulous. Subtle lucent areas in the calvarium are suspicious. Right jugular dialysis catheter tip in the lower SVC. No suspicious lesions in the upper extremities. Multilevel degenerativee facet disease in the cervical spine. Normal alignment of cervical spine. Prevertebral soft tissues are normal. Normal alignment of the thoracic spine. There appears to be 6 non rib-bearing vertebral bodies. Assuming that S1 is a transitional vertebral body, there is a suspicious lucent lesion along the left side of the L5 vertebral body and pedicle. Again noted is mild vertebral body height loss at L5. There is a stable superior compression deformity of the L3 vertebral body. Concern expansion of the left L1 pedicle. Cannot exclude a lesion at this location. Again noted is an expansile lesion involving the left eighth rib. This is better visualized on prior chest radiograph. Pelvic radiograph demonstrate a large lucent lesion involving the left iliac wing. This lesion measures up to 11 cm. Heterogeneous area of lucency in the midshaft of the left femur is concerning for a lesion. There is cortical thinning in this area. No suspicious lesions in the right lower extremity. IMPRESSION: Multiple bone lesions are  suggestive for metastatic bone disease and suspicious for multiple myeloma. Largest lesion is involving the left iliac wing and this would be the most amenable to biopsy. There is also an expansile lesion involving the left eighth rib and a lucent lesion involving the L5 left pedicle. Question a lesion of the left L1 pedicle. Probable small calvarial lesions. Suspicious lucency involving the midshaft of the left femur with cortical thinning. This is concerning for a metastatic bone lesion and would be at risk for a pathologic fracture. L3  compression fracture.  Questionable compression fracture at L5. These results were called by telephone at the time of interpretation on 05/12/2016 at 4:47 pm to Dr. Clementeen Graham, who verbally acknowledged these results. Electronically Signed   By: Markus Daft M.D.   On: 05/12/2016 16:47   Dg C-arm 61-120 Min  05/14/2016  CLINICAL DATA:  Elective surgery.  Left IM nail. EXAM: DG C-ARM 61-120 MIN; LEFT FEMUR 2 VIEWS COMPARISON:  05/13/2016 FINDINGS: Placement of IM nail within the left femur, bridging the permeative lesions seen on prior plain films. Hip screw in place as well. No hardware or bony complicating feature. IMPRESSION: Placement of hip screw and left femoral IM nail as above. No visible complicating feature. Electronically Signed   By: Rolm Baptise M.D.   On: 05/14/2016 13:45   Dg Femur Min 2 Views Left  05/14/2016  CLINICAL DATA:  Elective surgery.  Left IM nail. EXAM: DG C-ARM 61-120 MIN; LEFT FEMUR 2 VIEWS COMPARISON:  05/13/2016 FINDINGS: Placement of IM nail within the left femur, bridging the permeative lesions seen on prior plain films. Hip screw in place as well. No hardware or bony complicating feature. IMPRESSION: Placement of hip screw and left femoral IM nail as above. No visible complicating feature. Electronically Signed   By: Rolm Baptise M.D.   On: 05/14/2016 13:45   Dg Femur Min 2 Views Left  05/13/2016  CLINICAL DATA:  Left femur pain anteriorly from the left hip to the knee. EXAM: LEFT FEMUR 2 VIEWS COMPARISON:  Bone survey 05/12/2016 FINDINGS: There is permeative lucency within the midshaft of the femur with ill-defined margins and cortical involvement as described on yesterday's study. This lesion involves a length of at least 6 cm. No displaced fracture is identified. The left hip and left knee are located. No soft tissue abnormality is seen. IMPRESSION: Permeative left midshaft femur lesion concerning for malignancy as described on yesterday's study. No fracture identified.  Electronically Signed   By: Logan Bores M.D.   On: 05/13/2016 11:40   Dg Femur Port Min 2 Views Left  05/14/2016  CLINICAL DATA:  IM nail placement.  Postop pain. EXAM: LEFT FEMUR PORTABLE 2 VIEWS COMPARISON:  05/13/2016 FINDINGS: Placement of intra medullary nail and femoral neck screw. The IM nail crosses the herniated area within the mid shaft. There is a cortical defect noted laterally, new since prior study, possibly biopsy site. No additional interval change since prior study. IMPRESSION: Intra medullary nail placed across the permeative lesion in the mid left femur. Cortical defect laterally presumably represents biopsy site. Electronically Signed   By: Rolm Baptise M.D.   On: 05/14/2016 15:38    Labs:  CBC:  Recent Labs  05/13/16 1841 05/14/16 0517 05/14/16 1812 05/15/16 0743  WBC 4.1 4.4 6.8 5.9  HGB 9.8* 9.7* 7.6* 6.5*  HCT 29.0* 28.5* 22.1* 18.2*  PLT 59* 56* 99* 95*    COAGS:  Recent Labs  05/10/16 1156 05/11/16 0500  INR 1.40  1.46  APTT 33 31    BMP:  Recent Labs  05/13/16 1841 05/14/16 0517 05/14/16 1812 05/15/16 0743  NA 127* 129* 130* 130*  K 4.1 4.4 5.2* 5.3*  CL 95* 97* 101 98*  CO2 24 25 22 22   GLUCOSE 103* 79 136* 110*  BUN 22* 27* 31* 39*  CALCIUM 7.8* 8.2* 7.6* 7.7*  CREATININE 7.31* 8.31* 8.32* 9.26*  GFRNONAA 7* 6* 6* 5*  GFRAA 8* 7* 7* 6*    LIVER FUNCTION TESTS:  Recent Labs  05/10/16 0310 05/12/16 0913 05/13/16 1223  BILITOT 0.4  --   --   AST 13*  --   --   ALT 9*  --   --   ALKPHOS 72  --   --   PROT 9.7*  --   --   ALBUMIN 2.8* 2.1* 1.9*    TUMOR MARKERS: No results for input(s): AFPTM, CEA, CA199, CHROMGRNA in the last 8760 hours.  Assessment and Plan:  Back pain; wt loss Bony lesions Anemia;thrombocytopenia Worrisome for Multiple Myeloma Pathologic fx left femur surgery and bx 6/4 Insufficient per pathology Now scheduled for bone marrow bx 6/6 in Rad Risks and Benefits discussed with the patient including,  but not limited to bleeding, infection, damage to adjacent structures or low yield requiring additional tests. All of the patient's questions were answered, patient is agreeable to proceed. Consent signed and in chart.    Thank you for this interesting consult.  I greatly enjoyed meeting Thomas [name] and look forward to participating in their care.  A copy of this report was sent to the requesting provider on this date.  Electronically Signed: Monia Sabal A 05/15/2016, 1:28 PM   I spent a total of 20 Minutes    in face to face in clinical consultation, greater than 50% of which was counseling/coordinating care for bone marrow bx

## 2016-05-15 NOTE — Progress Notes (Signed)
Subjective:  Patient reports pain as mild to moderate.  Transport here to take patient to dialysis.  Objective:   VITALS:   Filed Vitals:   05/14/16 1844 05/14/16 1845 05/14/16 2147 05/15/16 0300  BP: 91/62  98/53 98/56  Pulse: 78  94 104  Temp:  98 F (36.7 C) 98.3 F (36.8 C) 98.3 F (36.8 C)  TempSrc:   Oral Oral  Resp: _0 Height:      Weight:      SpO2: 99%  99% 95%    ABD soft Sensation intact distally Intact pulses distally Dorsiflexion/Plantar flexion intact Incision: dressing C/D/I Compartment soft hip spica wrap intact   Lab Results  Component Value Date   WBC 6.8 05/14/2016   HGB 7.6* 05/14/2016   HCT 22.1* 05/14/2016   MCV 87.4 05/14/2016   PLT 99* 05/14/2016   BMET    Component Value Date/Time   NA 130* 05/14/2016 1812   K 5.2* 05/14/2016 1812   CL 101 05/14/2016 1812   CO2 22 05/14/2016 1812   GLUCOSE 136* 05/14/2016 1812   BUN 31* 05/14/2016 1812   CREATININE 8.32* 05/14/2016 1812   CALCIUM 7.6* 05/14/2016 1812   GFRNONAA 6* 05/14/2016 1812   GFRAA 7* 05/14/2016 1812     Assessment/Plan: 1 Day Post-Op   Principal Problem:   Kidney failure Active Problems:   Renal tubular acidosis, type 4   Occult blood positive stool   Malnutrition of moderate degree   Acute renal failure (HCC)   Myeloma ([place])   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

## 2016-05-15 NOTE — Progress Notes (Signed)
PROGRESS NOTE                                                                                                                                                                                                             Patient Demographics:    Thomas [name], is a 70 y.o. male, DOB - [birth year], NWG:[id number]  Admit date - 05/09/2016   Admitting Physician Etta Quill, DO  Outpatient Primary MD for the patient is No PCP Per Patient  LOS - 5  Outpatient Specialists: none  Chief Complaint  Patient presents with  . low hgb        Brief Narrative   70 year old male with no significant medical history (had not seen a physician in many years) presented with about 1 month of nausea with daily vomiting. Also reports poor by mouth intake and subjective weight loss. Denied any diarrhea, hematemesis or bleeding per rectum. Patient went to the urgent care and was found to have a hemoglobin of 9.8 and sent to the ED. In the ED blood work showed creatinine >10 and BUN of 40. He was taking high dose of NSAIDs about 3 months back for severe low back pain, prescribed by his chiropractor. Reports that he told them for less than 2 weeks. He was also found to have severe hyperkalemia and given insulin with dextrose. Admitted to hospitalist service for further management.  Lab work showing hyperproteinemia, abnormal lucency of lumbar vertebrae and multiple radiolucencies in the skull x-ray indicated towards multiple myeloma. Also found to have pathological fracture of left femur and underwent IM nailing.    Subjective:   Seen during dialysis. Complains of left hip pain. Patient tolerated left hip surgery well yesterday was transiently hypotensive postoperatively responded to pressor.   Assessment  & Plan :    Principal Problem:  Acute versus acute on chronic kidney disease -proteinuria with hyaline cast.  high serum protein level.  Ultrasound abdomen shows normal size kidneys.  Concern for AKI due  to myeloma with / without AIN from high dose NSAIDs use. -ANA, complements,  ANCA and hepatitis panel negative. -SPEP, light chains and  IFE indicated towards multiple myeloma.. -Renal biopsy  results Still pending. Receiving hemodialysis since 6/2. -renal consult appreciated.   Active Problems: multiple myeloma with diffuse skeletal lesions Bone survey showing metastatic bone lesions with largest  lesion involving left iliac wing. Also lesion involving midshaft of left femur with pathological fracture. L3 and L5 compression fracture. - MRI of the lumbar spine rule out cord compression. Dr. Alvy Bimler following. Recommend bone marrow biopsy. Left femoral bx done with surgery was insufficient for cytogenetic studies. IR consulted. Scheduled for 6/6.  Pathological fracture of left femur  IM nailing with open biopsy of the left femur with intraoperative frozen section done on 6/6.Marland Kitchen Results pending. continue pain control.  Anemia with thrombocytopenia.  Secondary to myeloma. .Has received several units of PRBC. Progressive thrombocytopenia. Platelet function assay is high. Avoid heparin products.  Received 7 u PRBC so far (2 units today) and 2 u platelets. Recheck labs in a.m.     Vitamin B12 deficiency Significantly low levels (106). Added supplements.   Hyperkalemia Improved after IV insulin and dextrose. Now on dialysis.    Malnutrition of moderate degree        Code Status : Full code  Family Communication  : Wife  at bedside  Disposition Plan  : Currently inpatient.  Barriers For Discharge : Acute kidney injury requiring RRT. Spost op.  Workup for multiple myeloma.  Consults  :  Nephrology (Dr. Vaiden Footman) IR Oncology (Dr. Alvy Bimler) Orthopedics (Dr Delfino Lovett)   Procedures  :  Renal ultrasound Dialysis catheter placement Left Renal biopsy IM nailing of left femur fracture MRI spine (pending) Bone  marrow biopsy (pending)  DVT Prophylaxis  :  SCDs   Lab Results  Component Value Date   PLT 95* 05/15/2016    Antibiotics  :  None  Anti-infectives    Start     Dose/Rate Route Frequency Ordered Stop   05/14/16 1915  clindamycin (CLEOCIN) IVPB 600 mg     600 mg 100 mL/hr over 30 Minutes Intravenous Every 6 hours 05/14/16 1912 05/15/16 0131   05/14/16 1200  clindamycin (CLEOCIN) IVPB 900 mg     900 mg 100 mL/hr over 30 Minutes Intravenous To Surgery 05/14/16 0055 05/14/16 1121   05/11/16 1225  vancomycin (VANCOCIN) 1-5 GM/200ML-% IVPB  Status:  Discontinued    Comments:  Shelton, Whitney   : cabinet override      05/11/16 1225 05/11/16 1234   05/11/16 1100  vancomycin (VANCOCIN) IVPB 1000 mg/200 mL premix     1,000 mg 200 mL/hr over 60 Minutes Intravenous To Radiology 05/11/16 0911 05/11/16 1354        Objective:   Filed Vitals:   05/15/16 0950 05/15/16 1007 05/15/16 1008 05/15/16 1035  BP: 120/60 108/60 101/58 100/67  Pulse: 96 99 99 100  Temp: 98.1 F (36.7 C) 98.2 F (36.8 C) 98.2 F (36.8 C) 98.3 F (36.8 C)  TempSrc:    Oral  Resp: _0 Height:      Weight:   77.5 kg (170 lb 13.7 oz)   SpO2:    98%    Wt Readings from Last 3 Encounters:  05/15/16 77.5 kg (170 lb 13.7 oz)     Intake/Output Summary (Last 24 hours) at 05/15/16 1600 Last data filed at 05/15/16 1047  Gross per 24 hour  Intake   1390 ml  Output    830 ml  Net    560 ml     Physical Exam  Gen: fatigued, not in distress HEENT:  moist mucosa, supple neck Chest: clear b/l, no added sounds, Right-sided dialysis catheter CVS: N S1&S2, no murmurs, rubs or gallop GI: soft, NT, ND, BS+,  Musculoskeletal: warm, no edema,  Ace wrap over left leg CNS: Alert and oriented    Data Review:    CBC  Recent Labs Lab 05/09/16 2144  05/13/16 1223 05/13/16 1841 05/14/16 0517 05/14/16 1812 05/15/16 0743  WBC 6.2  < > 3.3* 4.1 4.4 6.8 5.9  HGB 9.2*  < > 7.5* 9.8* 9.7* 7.6* 6.5*  HCT  26.5*  < > 22.0* 29.0* 28.5* 22.1* 18.2*  PLT 105*  < > 53* 59* 56* 99* 95*  MCV 85.8  < > 86.6 85.3 85.3 87.4 84.7  MCH 29.8  < > 29.5 28.8 29.0 30.0 30.2  MCHC 34.7  < > 34.1 33.8 34.0 34.4 35.7  RDW 12.3  < > 12.5 13.3 13.6 13.3 13.2  LYMPHSABS 1.1  --   --   --   --   --   --   MONOABS 0.6  --   --   --   --   --   --   EOSABS 0.1  --   --   --   --   --   --   BASOSABS 0.0  --   --   --   --   --   --   < > = values in this interval not displayed.  Chemistries   Recent Labs Lab 05/10/16 0310  05/13/16 1223 05/13/16 1841 05/14/16 0517 05/14/16 1812 05/15/16 0743  NA  --   < > 128* 127* 129* 130* 130*  K  --   < > 4.4 4.1 4.4 5.2* 5.3*  CL  --   < > 97* 95* 97* 101 98*  CO2  --   < > _0 GLUCOSE  --   < > 112* 103* 79 136* 110*  BUN  --   < > 36* 22* 27* 31* 39*  CREATININE  --   < > 9.93* 7.31* 8.31* 8.32* 9.26*  CALCIUM  --   < > 8.1* 7.8* 8.2* 7.6* 7.7*  MG 1.9  --   --   --   --   --   --   AST 13*  --   --   --   --   --   --   ALT 9*  --   --   --   --   --   --   ALKPHOS 72  --   --   --   --   --   --   BILITOT 0.4  --   --   --   --   --   --   < > = values in this interval not displayed. ------------------------------------------------------------------------------------------------------------------ No results for input(s): CHOL, HDL, LDLCALC, TRIG, CHOLHDL, LDLDIRECT in the last 72 hours.  No results found for: HGBA1C ------------------------------------------------------------------------------------------------------------------ No results for input(s): TSH, T4TOTAL, T3FREE, THYROIDAB in the last 72 hours.  Invalid input(s): FREET3 ------------------------------------------------------------------------------------------------------------------ No results for input(s): VITAMINB12, FOLATE, FERRITIN, TIBC, IRON, RETICCTPCT in the last 72 hours.  Coagulation profile  Recent Labs Lab 05/10/16 1156 05/11/16 0500  INR 1.40 1.46    No  results for input(s): DDIMER in the last 72 hours.  Cardiac Enzymes No results for input(s): CKMB, TROPONINI, MYOGLOBIN in the last 168 hours.  Invalid input(s): CK ------------------------------------------------------------------------------------------------------------------ No results found for: BNP  Inpatient Medications  Scheduled Meds: . sodium chloride   Intravenous Once  . sodium chloride   Intravenous Once  . [START ON 05/19/2016] darbepoetin (ARANESP) injection - DIALYSIS  100  mcg Intravenous Q Fri-HD  . docusate sodium  100 mg Oral BID  . feeding supplement (NEPRO CARB STEADY)  237 mL Oral BID BM  . multivitamin  1 tablet Oral QHS  . vitamin B-12  1,000 mcg Oral Daily   Continuous Infusions: . sodium chloride    . sodium chloride     PRN Meds:.  Micro Results Recent Results (from the past 240 hour(s))  Surgical pcr screen     Status: None   Collection Time: 05/14/16  1:00 AM  Result Value Ref Range Status   MRSA, PCR NEGATIVE NEGATIVE Final   Staphylococcus aureus NEGATIVE NEGATIVE Final    Comment:        The Xpert SA Assay (FDA approved for NASAL specimens in patients over 12 years of age), is one component of a comprehensive surveillance program.  Test performance has been validated by [facility] for patients greater than or equal to 44 year old. It is not intended to diagnose infection nor to guide or monitor treatment.     Radiology Reports Dg Skull 1-3 Views  05/11/2016  CLINICAL DATA:  Myeloma EXAM: SKULL - 1-3 VIEW COMPARISON:  None. FINDINGS: Frontal view shows 13 mm lucency in the parasagittal left skull. On the lateral view approximately 10 round or oval lucencies are identified, the largest measuring 14 mm in maximal diameter. There are also lucencies in both mandibles. IMPRESSION: Multiple radiolucencies consistent with myeloma. Electronically Signed   By: Skipper Cliche M.D.   On: 05/11/2016 10:42   Dg Chest 2 View  05/10/2016  CLINICAL  DATA:  Congestive heart failure. EXAM: CHEST  2 VIEW COMPARISON:  None. FINDINGS: The heart size and mediastinal contours are within normal limits. No pneumothorax or pleural effusion is noted. Right lung is clear. Density is seen in left lower lobe which may represent either pleural-based mass or possibly expansile lesion within left posterior rib. The visualized skeletal structures are unremarkable. IMPRESSION: Left lower lobe density is noted representing pleural-based mass or expansile lesion within left posterior rib. CT scan of the chest is recommended to evaluate for possible neoplasm or malignancy. Electronically Signed   By: Marijo Conception, M.D.   On: 05/10/2016 13:08   Dg Lumbar Spine 2-3 Views  05/10/2016  CLINICAL DATA:  Lower lumbar pain radiating down the left leg. Injury in February 2017. EXAM: LUMBAR SPINE - 2-3 VIEW COMPARISON:  None. FINDINGS: Transitional S1 vertebra. Abnormal lucency in the left L5 vertebra appreciable on the frontal projection, potentially with some slight compression in this vicinity, but with lucency out of proportion to the compression. Irregular margins of the cortex of the S1 vertebra on the lateral projection. Anterior wedge compression fracture at L3 with 30% loss of vertebral body height. Aortoiliac atherosclerotic vascular disease. IMPRESSION: 1. Compression fractures at L3 and L5, with abnormal lucency in the left side of the L5 vertebral body raising concern for multiple myeloma or lytic metastatic disease from a radiographic standpoint. MRI could be utilized for further characterization if clinically warranted. 2. There is also cortical thickening and irregularity of the S1 vertebra, underlying malignancy not excluded at S 1. 3. 30% compression fracture at L 3, age indeterminate. 4. Lumbar spondylosis. 5.  Aortoiliac atherosclerotic vascular disease. Electronically Signed   By: Van Clines M.D.   On: 05/10/2016 16:48   US Renal  05/10/2016  CLINICAL  DATA:  Acute kidney injury. EXAM: RENAL / URINARY TRACT ULTRASOUND COMPLETE COMPARISON:  None. FINDINGS: Right Kidney: Length: 11.4  cm. Echogenicity within normal limits. No mass or hydronephrosis visualized. Left Kidney: Length: 11.6 cm. Echogenicity within normal limits. There is a 2.3 x 2.1 x 2.1 cm cyst in the upper pole. No fall with mass or hydronephrosis visualized. Bladder: Appears normal for degree of bladder distention. IMPRESSION: 1. Left renal cyst.  Otherwise unremarkable renal ultrasound. 2. No obstructive uropathy. Electronically Signed   By: Jeb Levering M.D.   On: 05/10/2016 04:29   Pelvis Portable  05/14/2016  CLINICAL DATA:  Left IM nail.  Postop pain. EXAM: PORTABLE PELVIS 1-2 VIEWS COMPARISON:  None. FINDINGS: Partially images the hardware within the proximal left femur. Hip joints and SI joints are symmetric and unremarkable. No acute fracture, subluxation or dislocation. IMPRESSION: No acute bony abnormality. Electronically Signed   By: Rolm Baptise M.D.   On: 05/14/2016 15:37   Ir Fluoro Guide Cv Line Right  05/12/2016  INDICATION: 70 year old with renal failure.  Catheter needed for hemodialysis. EXAM: FLUOROSCOPIC AND ULTRASOUND GUIDED PLACEMENT OF A TUNNELED DIALYSIS CATHETER Physician: Stephan Minister. Anselm Pancoast, MD MEDICATIONS: Vancomycin 1 g; The antibiotic was administered within an appropriate time interval prior to skin puncture. ANESTHESIA/SEDATION: Versed 1.5 mg IV; Fentanyl 50 mcg IV; Moderate Sedation Time:  34 The patient was continuously monitored during the procedure by the interventional radiology nurse under my direct supervision. FLUOROSCOPY TIME:  Fluoroscopy Time: 36 seconds, 4 mGy COMPLICATIONS: None immediate. PROCEDURE: Informed consent was obtained for placement of a tunneled dialysis catheter. The patient was placed supine on the interventional table. Ultrasound confirmed a patent right internal jugular vein. Ultrasound images were obtained for documentation. The right  side of the neck was prepped and draped in a sterile fashion. The right side of the neck was anesthetized with 1% lidocaine. Maximal barrier sterile technique was utilized including caps, mask, sterile gowns, sterile gloves, sterile drape, hand hygiene and skin antiseptic. A small incision was made with #11 blade scalpel. A 21 gauge needle directed into the right internal jugular vein with ultrasound guidance. A micropuncture dilator set was placed. A 19 cm tip to cuff Palindrome catheter was selected. The skin below the right clavicle was anesthetized and a small incision was made with an #11 blade scalpel. A subcutaneous tunnel was formed to the vein dermatotomy site. The catheter was brought through the tunnel. The vein dermatotomy site was dilated to accommodate a peel-away sheath. The catheter was placed through the peel-away sheath and directed into the central venous structures. The tip of the catheter was placed at the superior cavoatrial junction with fluoroscopy. Fluoroscopic images were obtained for documentation. Both lumens were found to aspirate and flush well. The proper amount of heparin was flushed in both lumens. The vein dermatotomy site was closed using a single layer of absorbable suture and Dermabond. The catheter was secured to the skin using Prolene suture. Fluoroscopic and ultrasound images were taken and saved for documentation. IMPRESSION: Successful placement of a right jugular tunneled dialysis catheter using ultrasound and fluoroscopic guidance. Electronically Signed   By: Markus Daft M.D.   On: 05/12/2016 06:47   Ir US Guide Vasc Access Right  05/12/2016  INDICATION: 70 year old with renal failure.  Catheter needed for hemodialysis. EXAM: FLUOROSCOPIC AND ULTRASOUND GUIDED PLACEMENT OF A TUNNELED DIALYSIS CATHETER Physician: Stephan Minister. Anselm Pancoast, MD MEDICATIONS: Vancomycin 1 g; The antibiotic was administered within an appropriate time interval prior to skin puncture. ANESTHESIA/SEDATION:  Versed 1.5 mg IV; Fentanyl 50 mcg IV; Moderate Sedation Time:  34 The patient was continuously monitored  during the procedure by the interventional radiology nurse under my direct supervision. FLUOROSCOPY TIME:  Fluoroscopy Time: 36 seconds, 4 mGy COMPLICATIONS: None immediate. PROCEDURE: Informed consent was obtained for placement of a tunneled dialysis catheter. The patient was placed supine on the interventional table. Ultrasound confirmed a patent right internal jugular vein. Ultrasound images were obtained for documentation. The right side of the neck was prepped and draped in a sterile fashion. The right side of the neck was anesthetized with 1% lidocaine. Maximal barrier sterile technique was utilized including caps, mask, sterile gowns, sterile gloves, sterile drape, hand hygiene and skin antiseptic. A small incision was made with #11 blade scalpel. A 21 gauge needle directed into the right internal jugular vein with ultrasound guidance. A micropuncture dilator set was placed. A 19 cm tip to cuff Palindrome catheter was selected. The skin below the right clavicle was anesthetized and a small incision was made with an #11 blade scalpel. A subcutaneous tunnel was formed to the vein dermatotomy site. The catheter was brought through the tunnel. The vein dermatotomy site was dilated to accommodate a peel-away sheath. The catheter was placed through the peel-away sheath and directed into the central venous structures. The tip of the catheter was placed at the superior cavoatrial junction with fluoroscopy. Fluoroscopic images were obtained for documentation. Both lumens were found to aspirate and flush well. The proper amount of heparin was flushed in both lumens. The vein dermatotomy site was closed using a single layer of absorbable suture and Dermabond. The catheter was secured to the skin using Prolene suture. Fluoroscopic and ultrasound images were taken and saved for documentation. IMPRESSION: Successful  placement of a right jugular tunneled dialysis catheter using ultrasound and fluoroscopic guidance. Electronically Signed   By: Markus Daft M.D.   On: 05/12/2016 06:47   Ir US Guide Bx Asp/drain  05/12/2016  INDICATION: 70 year old with renal failure.  Request for renal biopsy. EXAM: ULTRASOUND-GUIDED RENAL BIOPSY MEDICATIONS: None. ANESTHESIA/SEDATION: Patient was monitored by radiology nurse throughout the procedure. Sedation was continued from the prior hemodialysis catheter placement. FLUOROSCOPY TIME:  None COMPLICATIONS: None immediate. PROCEDURE: Informed written consent was obtained from the patient after a thorough discussion of the procedural risks, benefits and alternatives. All questions were addressed. Maximal Sterile Barrier Technique was utilized including caps, mask, sterile gowns, sterile gloves, sterile drape, hand hygiene and skin antiseptic. A timeout was performed prior to the initiation of the procedure. Patient was placed prone. Both kidneys were evaluated with ultrasound. The right kidney was selected for biopsy. The right flank was prepped with chlorhexidine and a sterile field was created. Skin was anesthetized with 1% lidocaine. Using ultrasound guidance, 16 gauge core biopsy was obtained from the right kidney lower pole. Specimen placed in saline. A second ultrasound-guided core biopsy was obtained from the lower pole. Specimen was placed in saline. These samples were felt to be adequate. Bandage placed over the puncture site. FINDINGS: Two core biopsies obtained from the right kidney lower pole. Small perinephric hematoma identified following the procedure. This hematoma did not appear to be expanding based on post biopsy visualization. IMPRESSION: Ultrasound-guided core biopsies of the right kidney lower pole. Electronically Signed   By: Markus Daft M.D.   On: 05/12/2016 06:54   Dg Bone Survey Met  05/12/2016  CLINICAL DATA:  Back pain and weakness. Evaluate for multiple myeloma and  bone cancer. EXAM: METASTATIC BONE SURVEY COMPARISON:  05/10/2016 FINDINGS: Patient is edentulous. Subtle lucent areas in the calvarium are suspicious. Right jugular  dialysis catheter tip in the lower SVC. No suspicious lesions in the upper extremities. Multilevel degenerativee facet disease in the cervical spine. Normal alignment of cervical spine. Prevertebral soft tissues are normal. Normal alignment of the thoracic spine. There appears to be 6 non rib-bearing vertebral bodies. Assuming that S1 is a transitional vertebral body, there is a suspicious lucent lesion along the left side of the L5 vertebral body and pedicle. Again noted is mild vertebral body height loss at L5. There is a stable superior compression deformity of the L3 vertebral body. Concern expansion of the left L1 pedicle. Cannot exclude a lesion at this location. Again noted is an expansile lesion involving the left eighth rib. This is better visualized on prior chest radiograph. Pelvic radiograph demonstrate a large lucent lesion involving the left iliac wing. This lesion measures up to 11 cm. Heterogeneous area of lucency in the midshaft of the left femur is concerning for a lesion. There is cortical thinning in this area. No suspicious lesions in the right lower extremity. IMPRESSION: Multiple bone lesions are suggestive for metastatic bone disease and suspicious for multiple myeloma. Largest lesion is involving the left iliac wing and this would be the most amenable to biopsy. There is also an expansile lesion involving the left eighth rib and a lucent lesion involving the L5 left pedicle. Question a lesion of the left L1 pedicle. Probable small calvarial lesions. Suspicious lucency involving the midshaft of the left femur with cortical thinning. This is concerning for a metastatic bone lesion and would be at risk for a pathologic fracture. L3 compression fracture.  Questionable compression fracture at L5. These results were called by  telephone at the time of interpretation on 05/12/2016 at 4:47 pm to Dr. Clementeen Graham, who verbally acknowledged these results. Electronically Signed   By: Markus Daft M.D.   On: 05/12/2016 16:47   Dg C-arm 61-120 Min  05/14/2016  CLINICAL DATA:  Elective surgery.  Left IM nail. EXAM: DG C-ARM 61-120 MIN; LEFT FEMUR 2 VIEWS COMPARISON:  05/13/2016 FINDINGS: Placement of IM nail within the left femur, bridging the permeative lesions seen on prior plain films. Hip screw in place as well. No hardware or bony complicating feature. IMPRESSION: Placement of hip screw and left femoral IM nail as above. No visible complicating feature. Electronically Signed   By: Rolm Baptise M.D.   On: 05/14/2016 13:45   Dg Femur Min 2 Views Left  05/14/2016  CLINICAL DATA:  Elective surgery.  Left IM nail. EXAM: DG C-ARM 61-120 MIN; LEFT FEMUR 2 VIEWS COMPARISON:  05/13/2016 FINDINGS: Placement of IM nail within the left femur, bridging the permeative lesions seen on prior plain films. Hip screw in place as well. No hardware or bony complicating feature. IMPRESSION: Placement of hip screw and left femoral IM nail as above. No visible complicating feature. Electronically Signed   By: Rolm Baptise M.D.   On: 05/14/2016 13:45   Dg Femur Min 2 Views Left  05/13/2016  CLINICAL DATA:  Left femur pain anteriorly from the left hip to the knee. EXAM: LEFT FEMUR 2 VIEWS COMPARISON:  Bone survey 05/12/2016 FINDINGS: There is permeative lucency within the midshaft of the femur with ill-defined margins and cortical involvement as described on yesterday's study. This lesion involves a length of at least 6 cm. No displaced fracture is identified. The left hip and left knee are located. No soft tissue abnormality is seen. IMPRESSION: Permeative left midshaft femur lesion concerning for malignancy as described on yesterday's study.  No fracture identified. Electronically Signed   By: Logan Bores M.D.   On: 05/13/2016 11:40   Dg Femur Port Min 2 Views  Left  05/14/2016  CLINICAL DATA:  IM nail placement.  Postop pain. EXAM: LEFT FEMUR PORTABLE 2 VIEWS COMPARISON:  05/13/2016 FINDINGS: Placement of intra medullary nail and femoral neck screw. The IM nail crosses the herniated area within the mid shaft. There is a cortical defect noted laterally, new since prior study, possibly biopsy site. No additional interval change since prior study. IMPRESSION: Intra medullary nail placed across the permeative lesion in the mid left femur. Cortical defect laterally presumably represents biopsy site. Electronically Signed   By: Rolm Baptise M.D.   On: 05/14/2016 15:38    Time Spent in minutes  25   Louellen Molder M.D on 05/15/2016 at 4:00 PM  Between 7am to 7pm - Pager - [phone]  After 7pm go to [email or web address] - password [facility]  [facility] -  Office  [phone]

## 2016-05-15 NOTE — Op Note (Signed)
Thomas [name], Thomas [name]                ACCOUNT NO.:  [id number]  MEDICAL RECORD NO.:  [id number]  LOCATION:  6E13C                        FACILITY:  [facility]  PHYSICIAN:  Rod Can, MD     DATE OF BIRTH:  [birth year]  DATE OF PROCEDURE:  05/14/2016 DATE OF DISCHARGE:                              OPERATIVE REPORT   SURGEON:  Rod Can, MD  ASSISTANT:  Elsie Ra, PA-C  PREOPERATIVE DIAGNOSIS:  Impending pathologic fracture, left femur.  POSTOPERATIVE DIAGNOSIS:  Impending pathologic fracture, left femur.  PROCEDURE PERFORMED: 1. Open biopsy of left femur bone lesion. 2. Intramedullary fixation of left femur.  IMPLANTS: 1. Synthes TFNA nail, size 11 x 420 mm, 130 degrees. 2. TFNA screw size 105 mm. 3. A 5.0 x 42 mm distal interlocking screw x1.  ANESTHESIA:  General.  SPECIMENS: 1. Left femur bone lesion for frozen section.  Pathology indicated     this is consistent with multiple myeloma. 2. Left femur bone lesion for permanent pathology.  COMPLICATIONS:  None.  ANTIBIOTICS:  900 mg clindamycin.  EBL: 500 mL.  COMPLICATIONS:  None.  DISPOSITION:  Stable to PACU.  INDICATIONS:  The patient is a 70 year old male, who came to the hospital with nausea, back pain, and acute renal failure requiring dialysis.  Workup revealed anemia.  Skeletal survey was performed by the hospitalist team which showed multiple lytic bone lesions in his spine, skull, left iliac wing, and left mid shaft femur.  Orthopedic consultation was placed for the further risk of impending fracture to the left femur.  The patient does not currently have a tissue diagnosis. I discussed with the family open biopsy of the left femur lesion followed by prophylactic intramedullary nailing of the femur.  We discussed the risks, benefits, and alternatives.  He elected to proceed.  DESCRIPTION OF PROCEDURE IN DETAIL:  I identified the patient in the holding area using 2 identifiers.  The surgical site  was marked by myself.  He was taken to the operating room.  General anesthesia was induced.  He was transferred on the [facility] table.  Time-out was called verifying the site and side of surgery.  He did receive IV antibiotics within 60 minutes of beginning the procedure.  I used fluoroscopy to localize his femoral bone lesion.  I then made a 2 inch incision directly over the lateral aspect of the femur.  I incised the IT band and then I incised the vastus lateralis.  I came down upon the lateral cortex of the femur.  I used a half-inch osteotome to create a small cortical bone window.  I used a pituitary rongeur to harvest the tumor material from the medullary canal of the femur.  It was a purplish material that I sent for both frozen sections and for permanent pathology.  The pathologist did call and say that the findings were consistent with myeloma.  He did not think it was a primary bone tumor.  I then proceeded with intramedullary nailing.  I used fluoroscopy to define the anatomy of the proximal femur.  I made a 2-inch incision proximal to the tip of the greater trochanter.  I used the awl to determine the  standard starting point for trochanteric entry nail.  I passed the guide pin and then I used the entry reamer.  I passed a guidewire to the physeal scar of the knee.  I measured the length of the nail.  I sequentially reamed up to a 12.5 with excellent chatter.  I therefore selected an 11 mm nail which I passed into place through the jig.  I placed the guide pin for the cephalomedullary device.  This was done under AP and lateral fluoroscopic views.  I then measured, reamed, and placed the real TFNA lag screw.  The set screw was tightened.  I removed the jig.  Using perfect circle technique, I placed 1 distal interlocking screw.  Final AP and lateral fluoroscopy views were used to confirm a hardware placement.  There was no chondral penetration, tip apex distance was appropriate.  I  copiously irrigated all the wounds.  I inserted Surgifoam into the cortical window to control the medullary oozing.  I then closed the IT band with #1 Vicryl, 2-0 Monocryl for the deep dermal layer, running 3-0 Monocryl subcuticular stitch.  Dermabond was applied to the skin.  Once the glue was hardened, sterile dressings were applied.  The patient was then extubated, taken to PACU in stable condition.  Sponge, needle, and instrument counts were correct at the end of the case x2.  There were no known complications.  I discussed the operative events and findings with the patient's family. He may weightbear as tolerated with a walker.  He is currently not a candidate  for chemical DVT prophylaxis due to the risk of bleeding and thrombocytopenia.  He will work with physical therapy.  I will plan to see him in the office 2 weeks after discharge.  All questions solicited and answered.          ______________________________ Rod Can, MD     BS/MEDQ  D:  05/14/2016  T:  05/15/2016  Job:  [id number]

## 2016-05-15 NOTE — Progress Notes (Signed)
PT Cancellation Note  Patient Details Name: Thomas [name] MRN: [id number] DOB: 1946/02/11   Cancelled Treatment:    Reason Eval/Treat Not Completed:   Pt sleeping soundly, did not disturb;   Will follow up later today as time allows;  Otherwise, will follow up for PT tomorrow;   Thank you,  Roney Marion, [place] Pager [phone] Office [phone]     Roney Marion [facility] 05/15/2016, 2:17 PM

## 2016-05-15 NOTE — Procedures (Signed)
Pt seen on HD.  BFR 300.  AP 120  Vp 90.  SBP 108.  Tolerating HD well.  Will start CLIP process. Will need perm access prior to DC.  Suspect he will need Heme to see.  DC Mag citrate

## 2016-05-15 NOTE — Progress Notes (Signed)
OT Cancellation Note  Patient Details Name: Thomas [name] MRN: [id number] DOB: 05/31/1946   Cancelled Treatment:    Reason Eval/Treat Not Completed: Patient at procedure or test/ unavailable. Pt currently in HD, will try back as schedule allows.  Almon Register [id number] 05/15/2016, 7:14 AM

## 2016-05-16 ENCOUNTER — Ambulatory Visit
Admit: 2016-05-16 | Discharge: 2016-05-16 | Disposition: A | Discharge status: Home / Self Care | Payer: PPO | Attending: Radiation Oncology | Admitting: Radiation Oncology

## 2016-05-16 ENCOUNTER — Inpatient Hospital Stay (HOSPITAL_COMMUNITY): Payer: PPO

## 2016-05-16 ENCOUNTER — Ambulatory Visit: Payer: PPO | Admitting: Radiation Oncology

## 2016-05-16 DIAGNOSIS — Z510 Encounter for antineoplastic radiation therapy: Secondary | ICD-10-CM | POA: Insufficient documentation

## 2016-05-16 DIAGNOSIS — C9000 Multiple myeloma not having achieved remission: Secondary | ICD-10-CM | POA: Insufficient documentation

## 2016-05-16 LAB — CBC
HCT: 21.8 % — ABNORMAL LOW (ref 39.0–52.0)
Hemoglobin: 7.3 g/dL — ABNORMAL LOW (ref 13.0–17.0)
MCH: 30 pg (ref 26.0–34.0)
MCHC: 33.5 g/dL (ref 30.0–36.0)
MCV: 89.7 fL (ref 78.0–100.0)
PLATELETS: 93 10*3/uL — AB (ref 150–400)
RBC: 2.43 MIL/uL — ABNORMAL LOW (ref 4.22–5.81)
RDW: 13.8 % (ref 11.5–15.5)
WBC: 7.4 10*3/uL (ref 4.0–10.5)

## 2016-05-16 LAB — BASIC METABOLIC PANEL
Anion gap: 8 (ref 5–15)
BUN: 25 mg/dL — AB (ref 6–20)
CHLORIDE: 100 mmol/L — AB (ref 101–111)
CO2: 26 mmol/L (ref 22–32)
CREATININE: 6.84 mg/dL — AB (ref 0.61–1.24)
Calcium: 7.9 mg/dL — ABNORMAL LOW (ref 8.9–10.3)
GFR calc non Af Amer: 7 mL/min — ABNORMAL LOW (ref >60)
GFR, EST AFRICAN AMERICAN: 9 mL/min — AB (ref >60)
Glucose, Bld: 92 mg/dL (ref 65–99)
POTASSIUM: 4.6 mmol/L (ref 3.5–5.1)
SODIUM: 134 mmol/L — AB (ref 135–145)

## 2016-05-16 LAB — BONE MARROW EXAM

## 2016-05-16 LAB — VITAMIN D 25 HYDROXY (VIT D DEFICIENCY, FRACTURES): VIT D 25 HYDROXY: 16.6 ng/mL — AB (ref 30.0–100.0)

## 2016-05-16 MED ORDER — HYDROMORPHONE HCL 1 MG/ML IJ SOLN
INTRAMUSCULAR | Status: AC
  Administered 2016-05-16: 1 mg via INTRAVENOUS
  Filled 2016-05-16: qty 1

## 2016-05-16 MED ORDER — ACETAMINOPHEN 325 MG PO TABS
ORAL_TABLET | ORAL | Status: AC
  Filled 2016-05-16: qty 2

## 2016-05-16 MED ORDER — MIDAZOLAM HCL 2 MG/2ML IJ SOLN
INTRAMUSCULAR | Status: AC
  Filled 2016-05-16: qty 4

## 2016-05-16 MED ORDER — MIDAZOLAM HCL 2 MG/2ML IJ SOLN
INTRAMUSCULAR | Status: AC | PRN
  Administered 2016-05-16: 1 mg via INTRAVENOUS

## 2016-05-16 MED ORDER — FENTANYL CITRATE (PF) 100 MCG/2ML IJ SOLN
INTRAMUSCULAR | Status: AC
  Filled 2016-05-16: qty 4

## 2016-05-16 MED ORDER — VITAMIN D (ERGOCALCIFEROL) 1.25 MG (50000 UNIT) PO CAPS
50000.0000 [IU] | ORAL_CAPSULE | ORAL | Status: DC
Start: 2016-05-16 — End: 2016-05-16
  Filled 2016-05-16: qty 1

## 2016-05-16 MED ORDER — SODIUM CHLORIDE 0.9 % IV SOLN
INTRAVENOUS | Status: AC | PRN
  Administered 2016-05-16: 10 mL/h via INTRAVENOUS

## 2016-05-16 MED ORDER — FENTANYL CITRATE (PF) 100 MCG/2ML IJ SOLN
INTRAMUSCULAR | Status: AC | PRN
  Administered 2016-05-16: 50 ug via INTRAVENOUS

## 2016-05-16 MED ORDER — LIDOCAINE HCL 1 % IJ SOLN
INTRAMUSCULAR | Status: AC
Start: 2016-05-16 — End: 2016-05-16
  Filled 2016-05-16: qty 20

## 2016-05-16 NOTE — Procedures (Signed)
Interventional Radiology Procedure Note  Procedure: CT guided aspirate and core biopsy of left posterior iliac bone Complications: None Recommendations: - Bedrest supine x 3 hrs - OTC's PRN  Pain - Follow biopsy results  Signed,  Dulcy Fanny. Earleen Newport, DO

## 2016-05-16 NOTE — Progress Notes (Signed)
PT Cancellation Note  Patient Details Name: Thomas [name] MRN: [id number] DOB: [birth year]   Cancelled Treatment:    Reason Eval/Treat Not Completed: Patient at procedure or test/unavailable   Getting one marrow aspirate and biopsy at this time;   Will follow up later today as time allows;  Otherwise, will follow up for PT tomorrow;   Thank you,  Roney Marion, [place] Pager [phone] Office [phone]    [place], [place] 05/16/2016, 9:22 AM

## 2016-05-16 NOTE — Consult Note (Signed)
[place]         [phone] ________________________________  Initial inpatient Consultation  Name: Thomas [name] MRN: [id number]  Date: 05/09/2016  DOB: 1945-12-17  REFERRING PHYSICIAN: No ref. provider found  DIAGNOSIS: 71 yo man with newly diagnosed multiple myeloma     ICD-9-CM ICD-10-CM  8. Multiple myeloma ([place]) 203.00 C90.00   HISTORY OF PRESENT ILLNESS::Thomas [name] is a 70 y.o. male who is   He presented to urgent care and was noted to have renal failure and subsequently was admitted to [facility] on 05/10/2016 for further evaluation.  The patient is otherwise healthy up until about a month ago when he developed significant back pain and has been taking NSAID. The back pain is located in the lower back radiating down to the left leg.   His admission CBC on 05/09/2016 show white count of 6.2, hemoglobin 9.2 and platelet of 105. With aggressive fluid resuscitation, his blood count has dropped with white blood cell count of 3.9, hemoglobin of 6.9 and platelet count 64,000. He received 1 unit of blood transfusion.  Chest x-ray on 05/10/2016 show abnormal pleural-based mass or expansile lesion in the left lower rib.  Lumbar x-ray show abnormal lucency in the left L5 vertebral with evidence of compression fracture at L3  Skull x-ray on 05/11/2016 is positive for lytic lesions  The started hemodialysis soon due to evidence of renal failure  Serum chemistries also show evidence of elevated total protein, low albumin, low calcium and high potassium. Serum iron, folate and vitamin B-12 were unremarkable..  05/12/16 skeletal survey showed multiple lytic lesions  05/13/16 Lt femur film showed a lytic mid-shaft lucency without fracture    05/14/16 he had intramedullary fixation of the left femur with biopsy.    05/15/16 total spine MRI showed L3 compression frax and L5 lesion with significant canal and paraspinal compoenent    05/16/16 he had CT biopsy  of left iliac bone.  Biopsies show myeloma  PREVIOUS RADIATION THERAPY: No  Past Medical History  Diagnosis Date  . Compression fracture   :   Past Surgical History  Procedure Laterality Date  . Mouth surgery    . Femur im nail Left 05/14/2016    Procedure: INTRAMEDULLARY (IM) NAIL FEMORAL;  Surgeon: Rod Can, MD;  Location: [place];  Service: Orthopedics;  Laterality: Left;  . Bone biopsy Left 05/14/2016    Procedure: LEFT FEMORAL BIOPSY WITH INTRAOPERATIVE FROZEN SECTIONS ;  Surgeon: Rod Can, MD;  Location: [place];  Service: Orthopedics;  Laterality: Left;  :   Current facility-administered medications:  .  0.9 %  sodium chloride infusion, , Intravenous, Once, Nishant Dhungel, MD .  0.9 %  sodium chloride infusion, , Intravenous, Continuous, Adam Hodierne, MD .  0.9 %  sodium chloride infusion, , Intravenous, Continuous, Jaclyn M Bissell, PA-C .  0.9 %  sodium chloride infusion, , Intravenous, Once, Rod Can, MD .  acetaminophen (TYLENOL) 325 MG tablet, , , ,  .  acetaminophen (TYLENOL) tablet 650 mg, 650 mg, Oral, Q6H PRN **OR** acetaminophen (TYLENOL) suppository 650 mg, 650 mg, Rectal, Q6H PRN, Cecilie Kicks, PA-C .  alum & mag hydroxide-simeth (MAALOX/MYLANTA) 200-200-20 MG/5ML suspension 30 mL, 30 mL, Oral, Q4H PRN, Jaclyn M Bissell, PA-C .  bisacodyl (DULCOLAX) EC tablet 5 mg, 5 mg, Oral, Daily PRN, Cecilie Kicks, PA-C .  [START ON 05/19/2016] Darbepoetin Alfa (ARANESP) injection 100 mcg, 100 mcg, Intravenous, Q Fri-HD, Fleet Contras, MD .  docusate  sodium (COLACE) capsule 100 mg, 100 mg, Oral, BID, Jaclyn M Bissell, PA-C, 100 mg at 05/16/16 1211 .  feeding supplement (NEPRO CARB STEADY) liquid 237 mL, 237 mL, Oral, BID BM, Jared M Gardner, DO, 237 mL at 05/15/16 1123 .  fentaNYL (SUBLIMAZE) 100 MCG/2ML injection, , , ,  .  HYDROcodone-acetaminophen (NORCO/VICODIN) 5-325 MG per tablet 1-2 tablet, 1-2 tablet, Oral, Q6H PRN, Cecilie Kicks, PA-C, 1  tablet at 05/16/16 1426 .  HYDROmorphone (DILAUDID) injection 1 mg, 1 mg, Intravenous, Q3H PRN, Nishant Dhungel, MD, 1 mg at 05/16/16 1900 .  menthol-cetylpyridinium (CEPACOL) lozenge 3 mg, 1 lozenge, Oral, PRN **OR** phenol (CHLORASEPTIC) mouth spray 1 spray, 1 spray, Mouth/Throat, PRN, Jaclyn M Bissell, PA-C .  metoCLOPramide (REGLAN) tablet 5-10 mg, 5-10 mg, Oral, Q8H PRN **OR** metoCLOPramide (REGLAN) injection 5-10 mg, 5-10 mg, Intravenous, Q8H PRN, Cecilie Kicks, PA-C .  midazolam (VERSED) 2 MG/2ML injection, , , ,  .  multivitamin (RENA-VIT) tablet 1 tablet, 1 tablet, Oral, QHS, Mauricia Area, MD, 1 tablet at 05/15/16 2227 .  ondansetron (ZOFRAN) tablet 4 mg, 4 mg, Oral, Q6H PRN **OR** ondansetron (ZOFRAN) injection 4 mg, 4 mg, Intravenous, Q6H PRN, Cecilie Kicks, PA-C, 4 mg at 05/15/16 1233 .  polyethylene glycol (MIRALAX / GLYCOLAX) packet 17 g, 17 g, Oral, Daily PRN, Jaclyn M Bissell, PA-C .  vitamin B-12 (CYANOCOBALAMIN) tablet 1,000 mcg, 1,000 mcg, Oral, Daily, Nishant Dhungel, MD, 1,000 mcg at 05/16/16 1211:   Allergies  Allergen Reactions  . Codeine Other (See Comments)    "MAKES ME JUMPY"  . Penicillins     Has patient had a PCN reaction causing immediate rash, facial/tongue/throat swelling, SOB or lightheadedness with hypotension: Yes Has patient had a PCN reaction causing severe rash involving mucus membranes or skin necrosis: Yes Has patient had a PCN reaction that required hospitalization: No Has patient had a PCN reaction occurring within the last 10 years: No If all of the above answers are "NO", then may proceed with Cephalosporin use.   :   Family History  Problem Relation Age of Onset  . Diabetes Father   :   Social History   Social History  . Marital Status: Married    Spouse Name: N/A  . Number of Children: N/A  . Years of Education: N/A   Occupational History  . Not on file.   Social History Main Topics  . Smoking status: Former Research scientist (life sciences)  .  Smokeless tobacco: Not on file  . Alcohol Use: No  . Drug Use: No  . Sexual Activity: Not on file   Other Topics Concern  . Not on file   Social History Narrative  :  REVIEW OF SYSTEMS:  A 15 point review of systems is documented in the electronic medical record. This was obtained by the nursing staff. However, I reviewed this with the patient to discuss relevant findings and make appropriate changes.  Pertinent items are noted in HPI.   PHYSICAL EXAM:  Blood pressure 135/57, pulse 105, temperature 100.5 F (38.1 C), temperature source Oral, resp. rate 20, height 6' 1" (1.854 m), weight 170 lb 3.1 oz (77.2 kg), SpO2 99 %. per med-onc GENERAL:alert, no distress and comfortable SKIN: skin color, texture, turgor are normal, no rashes or significant lesions EYES: normal, conjunctiva are pink and non-injected, sclera clear OROPHARYNX:no exudate, no erythema and lips, buccal mucosa, and tongue normal  NECK: supple, thyroid normal size, non-tender, without nodularity LYMPH: no palpable lymphadenopathy in the cervical, axillary  or inguinal LUNGS: clear to auscultation and percussion with normal breathing effort HEART: regular rate & rhythm and no murmurs and no lower extremity edema ABDOMEN:abdomen soft, non-tender and normal bowel sounds Musculoskeletal:no cyanosis of digits and no clubbing  PSYCH: alert & oriented x 3 with fluent speech NEURO: no focal motor/sensory deficits  KPS = 40  100 - Normal; no complaints; no evidence of disease. 90   - Able to carry on normal activity; minor signs or symptoms of disease. 80   - Normal activity with effort; some signs or symptoms of disease. 38   - Cares for self; unable to carry on normal activity or to do active work. 60   - Requires occasional assistance, but is able to care for most of his personal needs. 50   - Requires considerable assistance and frequent medical care. 17   - Disabled; requires special care and assistance. 75   - Severely  disabled; hospital admission is indicated although death not imminent. 70   - Very sick; hospital admission necessary; active supportive treatment necessary. 10   - Moribund; fatal processes progressing rapidly. 0     - Dead  Karnofsky DA, Abelmann [place], Craver LS and Burchenal [facility] [phone]) The use of the nitrogen mustards in the palliative treatment of carcinoma: with particular reference to bronchogenic carcinoma Cancer 1 [id number]  LABORATORY DATA:  Lab Results  Component Value Date   WBC 7.4 05/16/2016   HGB 7.3* 05/16/2016   HCT 21.8* 05/16/2016   MCV 89.7 05/16/2016   PLT 93* 05/16/2016   Lab Results  Component Value Date   NA 134* 05/16/2016   K 4.6 05/16/2016   CL 100* 05/16/2016   CO2 26 05/16/2016   Lab Results  Component Value Date   ALT 9* 05/10/2016   AST 13* 05/10/2016   ALKPHOS 72 05/10/2016   BILITOT 0.4 05/10/2016     RADIOGRAPHY: Dg Skull 1-3 Views  05/11/2016  CLINICAL DATA:  Myeloma EXAM: SKULL - 1-3 VIEW COMPARISON:  None. FINDINGS: Frontal view shows 13 mm lucency in the parasagittal left skull. On the lateral view approximately 10 round or oval lucencies are identified, the largest measuring 14 mm in maximal diameter. There are also lucencies in both mandibles. IMPRESSION: Multiple radiolucencies consistent with myeloma. Electronically Signed   By: Skipper Cliche M.D.   On: 05/11/2016 10:42   Dg Chest 2 View  05/10/2016  CLINICAL DATA:  Congestive heart failure. EXAM: CHEST  2 VIEW COMPARISON:  None. FINDINGS: The heart size and mediastinal contours are within normal limits. No pneumothorax or pleural effusion is noted. Right lung is clear. Density is seen in left lower lobe which may represent either pleural-based mass or possibly expansile lesion within left posterior rib. The visualized skeletal structures are unremarkable. IMPRESSION: Left lower lobe density is noted representing pleural-based mass or expansile lesion within left posterior rib. CT scan of the  chest is recommended to evaluate for possible neoplasm or malignancy. Electronically Signed   By: Marijo Conception, M.D.   On: 05/10/2016 13:08   Dg Lumbar Spine 2-3 Views  05/10/2016  CLINICAL DATA:  Lower lumbar pain radiating down the left leg. Injury in February 2017. EXAM: LUMBAR SPINE - 2-3 VIEW COMPARISON:  None. FINDINGS: Transitional S1 vertebra. Abnormal lucency in the left L5 vertebra appreciable on the frontal projection, potentially with some slight compression in this vicinity, but with lucency out of proportion to the compression. Irregular margins of the cortex of the S1 vertebra  on the lateral projection. Anterior wedge compression fracture at L3 with 30% loss of vertebral body height. Aortoiliac atherosclerotic vascular disease. IMPRESSION: 1. Compression fractures at L3 and L5, with abnormal lucency in the left side of the L5 vertebral body raising concern for multiple myeloma or lytic metastatic disease from a radiographic standpoint. MRI could be utilized for further characterization if clinically warranted. 2. There is also cortical thickening and irregularity of the S1 vertebra, underlying malignancy not excluded at S 1. 3. 30% compression fracture at L 3, age indeterminate. 4. Lumbar spondylosis. 5.  Aortoiliac atherosclerotic vascular disease. Electronically Signed   By: Van Clines M.D.   On: 05/10/2016 16:48   US Renal  05/10/2016  CLINICAL DATA:  Acute kidney injury. EXAM: RENAL / URINARY TRACT ULTRASOUND COMPLETE COMPARISON:  None. FINDINGS: Right Kidney: Length: 11.4 cm. Echogenicity within normal limits. No mass or hydronephrosis visualized. Left Kidney: Length: 11.6 cm. Echogenicity within normal limits. There is a 2.3 x 2.1 x 2.1 cm cyst in the upper pole. No fall with mass or hydronephrosis visualized. Bladder: Appears normal for degree of bladder distention. IMPRESSION: 1. Left renal cyst.  Otherwise unremarkable renal ultrasound. 2. No obstructive uropathy.  Electronically Signed   By: Jeb Levering M.D.   On: 05/10/2016 04:29   Pelvis Portable  05/14/2016  CLINICAL DATA:  Left IM nail.  Postop pain. EXAM: PORTABLE PELVIS 1-2 VIEWS COMPARISON:  None. FINDINGS: Partially images the hardware within the proximal left femur. Hip joints and SI joints are symmetric and unremarkable. No acute fracture, subluxation or dislocation. IMPRESSION: No acute bony abnormality. Electronically Signed   By: Rolm Baptise M.D.   On: 05/14/2016 15:37   Ir Fluoro Guide Cv Line Right  05/12/2016  INDICATION: 70 year old with renal failure.  Catheter needed for hemodialysis. EXAM: FLUOROSCOPIC AND ULTRASOUND GUIDED PLACEMENT OF A TUNNELED DIALYSIS CATHETER Physician: Stephan Minister. Anselm Pancoast, MD MEDICATIONS: Vancomycin 1 g; The antibiotic was administered within an appropriate time interval prior to skin puncture. ANESTHESIA/SEDATION: Versed 1.5 mg IV; Fentanyl 50 mcg IV; Moderate Sedation Time:  34 The patient was continuously monitored during the procedure by the interventional radiology nurse under my direct supervision. FLUOROSCOPY TIME:  Fluoroscopy Time: 36 seconds, 4 mGy COMPLICATIONS: None immediate. PROCEDURE: Informed consent was obtained for placement of a tunneled dialysis catheter. The patient was placed supine on the interventional table. Ultrasound confirmed a patent right internal jugular vein. Ultrasound images were obtained for documentation. The right side of the neck was prepped and draped in a sterile fashion. The right side of the neck was anesthetized with 1% lidocaine. Maximal barrier sterile technique was utilized including caps, mask, sterile gowns, sterile gloves, sterile drape, hand hygiene and skin antiseptic. A small incision was made with #11 blade scalpel. A 21 gauge needle directed into the right internal jugular vein with ultrasound guidance. A micropuncture dilator set was placed. A 19 cm tip to cuff Palindrome catheter was selected. The skin below the right  clavicle was anesthetized and a small incision was made with an #11 blade scalpel. A subcutaneous tunnel was formed to the vein dermatotomy site. The catheter was brought through the tunnel. The vein dermatotomy site was dilated to accommodate a peel-away sheath. The catheter was placed through the peel-away sheath and directed into the central venous structures. The tip of the catheter was placed at the superior cavoatrial junction with fluoroscopy. Fluoroscopic images were obtained for documentation. Both lumens were found to aspirate and flush well. The proper amount of  heparin was flushed in both lumens. The vein dermatotomy site was closed using a single layer of absorbable suture and Dermabond. The catheter was secured to the skin using Prolene suture. Fluoroscopic and ultrasound images were taken and saved for documentation. IMPRESSION: Successful placement of a right jugular tunneled dialysis catheter using ultrasound and fluoroscopic guidance. Electronically Signed   By: Markus Daft M.D.   On: 05/12/2016 06:47   Ir US Guide Vasc Access Right  05/12/2016  INDICATION: 70 year old with renal failure.  Catheter needed for hemodialysis. EXAM: FLUOROSCOPIC AND ULTRASOUND GUIDED PLACEMENT OF A TUNNELED DIALYSIS CATHETER Physician: Stephan Minister. Anselm Pancoast, MD MEDICATIONS: Vancomycin 1 g; The antibiotic was administered within an appropriate time interval prior to skin puncture. ANESTHESIA/SEDATION: Versed 1.5 mg IV; Fentanyl 50 mcg IV; Moderate Sedation Time:  34 The patient was continuously monitored during the procedure by the interventional radiology nurse under my direct supervision. FLUOROSCOPY TIME:  Fluoroscopy Time: 36 seconds, 4 mGy COMPLICATIONS: None immediate. PROCEDURE: Informed consent was obtained for placement of a tunneled dialysis catheter. The patient was placed supine on the interventional table. Ultrasound confirmed a patent right internal jugular vein. Ultrasound images were obtained for documentation.  The right side of the neck was prepped and draped in a sterile fashion. The right side of the neck was anesthetized with 1% lidocaine. Maximal barrier sterile technique was utilized including caps, mask, sterile gowns, sterile gloves, sterile drape, hand hygiene and skin antiseptic. A small incision was made with #11 blade scalpel. A 21 gauge needle directed into the right internal jugular vein with ultrasound guidance. A micropuncture dilator set was placed. A 19 cm tip to cuff Palindrome catheter was selected. The skin below the right clavicle was anesthetized and a small incision was made with an #11 blade scalpel. A subcutaneous tunnel was formed to the vein dermatotomy site. The catheter was brought through the tunnel. The vein dermatotomy site was dilated to accommodate a peel-away sheath. The catheter was placed through the peel-away sheath and directed into the central venous structures. The tip of the catheter was placed at the superior cavoatrial junction with fluoroscopy. Fluoroscopic images were obtained for documentation. Both lumens were found to aspirate and flush well. The proper amount of heparin was flushed in both lumens. The vein dermatotomy site was closed using a single layer of absorbable suture and Dermabond. The catheter was secured to the skin using Prolene suture. Fluoroscopic and ultrasound images were taken and saved for documentation. IMPRESSION: Successful placement of a right jugular tunneled dialysis catheter using ultrasound and fluoroscopic guidance. Electronically Signed   By: Markus Daft M.D.   On: 05/12/2016 06:47   Ir US Guide Bx Asp/drain  05/12/2016  INDICATION: 70 year old with renal failure.  Request for renal biopsy. EXAM: ULTRASOUND-GUIDED RENAL BIOPSY MEDICATIONS: None. ANESTHESIA/SEDATION: Patient was monitored by radiology nurse throughout the procedure. Sedation was continued from the prior hemodialysis catheter placement. FLUOROSCOPY TIME:  None COMPLICATIONS: None  immediate. PROCEDURE: Informed written consent was obtained from the patient after a thorough discussion of the procedural risks, benefits and alternatives. All questions were addressed. Maximal Sterile Barrier Technique was utilized including caps, mask, sterile gowns, sterile gloves, sterile drape, hand hygiene and skin antiseptic. A timeout was performed prior to the initiation of the procedure. Patient was placed prone. Both kidneys were evaluated with ultrasound. The right kidney was selected for biopsy. The right flank was prepped with chlorhexidine and a sterile field was created. Skin was anesthetized with 1% lidocaine. Using ultrasound guidance, 16  gauge core biopsy was obtained from the right kidney lower pole. Specimen placed in saline. A second ultrasound-guided core biopsy was obtained from the lower pole. Specimen was placed in saline. These samples were felt to be adequate. Bandage placed over the puncture site. FINDINGS: Two core biopsies obtained from the right kidney lower pole. Small perinephric hematoma identified following the procedure. This hematoma did not appear to be expanding based on post biopsy visualization. IMPRESSION: Ultrasound-guided core biopsies of the right kidney lower pole. Electronically Signed   By: Markus Daft M.D.   On: 05/12/2016 06:54   Ct Biopsy  05/16/2016  INDICATION: 70 year old male with a history of multiple myeloma. EXAM: CT BIOPSY MEDICATIONS: None. ANESTHESIA/SEDATION: Moderate (conscious) sedation was employed during this procedure. A total of Versed 1.0 mg and Fentanyl 50 mcg was administered intravenously. Moderate Sedation Time: 15 minutes. The patient's level of consciousness and vital signs were monitored continuously by radiology nursing throughout the procedure under my direct supervision. FLUOROSCOPY TIME:  CT COMPLICATIONS: None PROCEDURE: The procedure risks, benefits, and alternatives were explained to the patient. Questions regarding the procedure  were encouraged and answered. The patient understands and consents to the procedure. Scout CT of the pelvis was performed for surgical planning purposes. The posterior pelvis was prepped with Betadinein a sterile fashion, and a sterile drape was applied covering the operative field. A sterile gown and sterile gloves were used for the procedure. Local anesthesia was provided with 1% Lidocaine. We targeted the left posterior iliac bone for biopsy. The skin and subcutaneous tissues were infiltrated with 1% lidocaine without epinephrine. A small stab incision was made with an 11 blade scalpel, and an 11 gauge Murphy needle was advanced with CT guidance to the posterior cortex. Manual forced was used to advance the needle through the posterior cortex and the stylet was removed. A bone marrow aspirate was retrieved and passed to a cytotechnologist in the room. The Murphy needle was then advanced without the stylet for a core biopsy. The core biopsy was retrieved and also passed to a cytotechnologist. Manual pressure was used for hemostasis and a sterile dressing was placed. No complications were encountered no significant blood loss was encountered. Patient tolerated the procedure well and remained hemodynamically stable throughout. IMPRESSION: Status post CT-guided bone marrow biopsy, with tissue specimen sent to pathology for complete histopathologic analysis Signed, Dulcy Fanny. Earleen Newport, DO [facility] Radiology Electronically Signed   By: Corrie Mckusick D.O.   On: 05/16/2016 10:09   Dg Bone Survey Met  05/12/2016  CLINICAL DATA:  Back pain and weakness. Evaluate for multiple myeloma and bone cancer. EXAM: METASTATIC BONE SURVEY COMPARISON:  05/10/2016 FINDINGS: Patient is edentulous. Subtle lucent areas in the calvarium are suspicious. Right jugular dialysis catheter tip in the lower SVC. No suspicious lesions in the upper extremities. Multilevel degenerativee facet disease  in the cervical spine. Normal alignment of cervical spine. Prevertebral soft tissues are normal. Normal alignment of the thoracic spine. There appears to be 6 non rib-bearing vertebral bodies. Assuming that S1 is a transitional vertebral body, there is a suspicious lucent lesion along the left side of the L5 vertebral body and pedicle. Again noted is mild vertebral body height loss at L5. There is a stable superior compression deformity of the L3 vertebral body. Concern expansion of the left L1 pedicle. Cannot exclude a lesion at this location. Again noted is an expansile lesion involving the left eighth rib. This is better visualized on prior chest  radiograph. Pelvic radiograph demonstrate a large lucent lesion involving the left iliac wing. This lesion measures up to 11 cm. Heterogeneous area of lucency in the midshaft of the left femur is concerning for a lesion. There is cortical thinning in this area. No suspicious lesions in the right lower extremity. IMPRESSION: Multiple bone lesions are suggestive for metastatic bone disease and suspicious for multiple myeloma. Largest lesion is involving the left iliac wing and this would be the most amenable to biopsy. There is also an expansile lesion involving the left eighth rib and a lucent lesion involving the L5 left pedicle. Question a lesion of the left L1 pedicle. Probable small calvarial lesions. Suspicious lucency involving the midshaft of the left femur with cortical thinning. This is concerning for a metastatic bone lesion and would be at risk for a pathologic fracture. L3 compression fracture.  Questionable compression fracture at L5. These results were called by telephone at the time of interpretation on 05/12/2016 at 4:47 pm to Dr. Clementeen Graham, who verbally acknowledged these results. Electronically Signed   By: Markus Daft M.D.   On: 05/12/2016 16:47   Dg C-arm 61-120 Min  05/14/2016  CLINICAL DATA:  Elective surgery.  Left IM nail. EXAM: DG C-ARM 61-120 MIN;  LEFT FEMUR 2 VIEWS COMPARISON:  05/13/2016 FINDINGS: Placement of IM nail within the left femur, bridging the permeative lesions seen on prior plain films. Hip screw in place as well. No hardware or bony complicating feature. IMPRESSION: Placement of hip screw and left femoral IM nail as above. No visible complicating feature. Electronically Signed   By: Rolm Baptise M.D.   On: 05/14/2016 13:45   Mr Total Spine Mets Screening  05/16/2016  CLINICAL DATA:  Initial evaluation for metastatic disease, presumed multiple myeloma. EXAM: MRI TOTAL SPINE WITHOUT CONTRAST TECHNIQUE: Multisequence MR imaging of the spine from the cervical spine to the sacrum was performed prior to and following IV contrast administration for evaluation of spinal metastatic disease. CONTRAST:  None. COMPARISON:  None. FINDINGS: Cervical Findings: Limited sagittal views of the cervical spine demonstrate a diffusely abnormal marrow pattern with multiple osseous lesions present, concerning for multiple myeloma. The most prevalent of these is present in the dens which demonstrates fairly diffuse abnormal STIR signal intensity (series 6, image 6). No significant extra osseous extension of tumor. No pathologic fracture. Scattered mild degenerative disc bulging throughout the cervical spine with posterior element hypertrophy. Associated diffuse canal narrowing without severe canal stenosis. No paraspinous soft tissue mass appreciated. Thoracic Findings: Limited sagittal views of the thoracic spine demonstrate diffusely abnormal marrow pattern with multiple osseous lesions present, again highly concerning for multiple myeloma. Most prominent lesion present within the T3 vertebral body and measures 19 mm. No significant extra osseous extension of tumor identified on this limited exam. No associated pathologic fracture. No paraspinous soft tissue mass appreciated. Lumbar Findings: There are 6 non rib-bearing lumbar type vertebral bodies. The lowest  vertebral body is labeled L6. Vertebral body count is made from the odontoid week. Limited sagittal views of the lumbar spine demonstrate diffusely abnormal marrow pattern with innumerable osseous lesions present, highly concerning for multiple myeloma. These are present throughout the lumbar spine and sacrum. There is associated probable pathologic fracture involving the L3 vertebral body with approximately 50% of central height loss. No significant bony retropulsion. Mild compression deformity at the superior endplate of L2, likely related to chronic Schmorl's node. Large metastatic/myelomatous lesion involves the L5 vertebral body with extension into the left posterior elements. This  lesion completely involves the central and left aspect of the L5 vertebral body with destruction of the left posterior elements and extension into the paraspinous soft tissues. Lesion measures approximately 6.0 x 9.0 x 7.5 cm (series 12, image 10). Abnormal posterior convex bowing of the L5 vertebral body with moderate to severe canal stenosis (series 10, image 7). There is extra osseous extension of tumor into the adjacent left L4-5 and L5-6 neural foramina with secondary mild narrowing at L4-5 and more moderate to severe narrowing at L5-6 (series 9, image 12). A second large ovoid lesion involving the left iliac wing is partially visualized (series 12, image 13). No other definite paraspinous soft tissues mass. T2 hyperintense cyst noted within the left kidney. IMPRESSION: 1. 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 Careful correlation to the vertebral body count on this exam is recommended prior to any potential future intervention. 2. Diffusely abnormal marrow signal intensity with innumerable osseous lesions present throughout the spine, concerning for multiple myeloma given the provided history. 3. Large 6.0 x 9.0 x 7.5 cm lesion with associated extraosseous extension of tumor into  the left paraspinous soft tissues at L5. Abnormal posterior convex bowing of the L5 vertebral body with moderate to severe canal stenosis. Neural foraminal encroachment on the left at L4-5 and L5-6 as well. No other significant canal or neural foraminal encroachment within the lumbar spine. 4. No significant extraosseous tumor or or spinal canal encroachment within the cervical and thoracic spine. 5. Compression deformity of L3 with up to 50% height loss, which may be pathologic in nature. No bony retropulsion. 6. Additional large lesion with extraosseous extension of tumor involving the left iliac wing, incompletely visualized on this exam. Electronically Signed   By: Jeannine Boga M.D.   On: 05/16/2016 00:51   Dg Femur Min 2 Views Left  05/14/2016  CLINICAL DATA:  Elective surgery.  Left IM nail. EXAM: DG C-ARM 61-120 MIN; LEFT FEMUR 2 VIEWS COMPARISON:  05/13/2016 FINDINGS: Placement of IM nail within the left femur, bridging the permeative lesions seen on prior plain films. Hip screw in place as well. No hardware or bony complicating feature. IMPRESSION: Placement of hip screw and left femoral IM nail as above. No visible complicating feature. Electronically Signed   By: Rolm Baptise M.D.   On: 05/14/2016 13:45   Dg Femur Min 2 Views Left  05/13/2016  CLINICAL DATA:  Left femur pain anteriorly from the left hip to the knee. EXAM: LEFT FEMUR 2 VIEWS COMPARISON:  Bone survey 05/12/2016 FINDINGS: There is permeative lucency within the midshaft of the femur with ill-defined margins and cortical involvement as described on yesterday's study. This lesion involves a length of at least 6 cm. No displaced fracture is identified. The left hip and left knee are located. No soft tissue abnormality is seen. IMPRESSION: Permeative left midshaft femur lesion concerning for malignancy as described on yesterday's study. No fracture identified. Electronically Signed   By: Logan Bores M.D.   On: 05/13/2016 11:40   Dg  Femur Port Min 2 Views Left  05/14/2016  CLINICAL DATA:  IM nail placement.  Postop pain. EXAM: LEFT FEMUR PORTABLE 2 VIEWS COMPARISON:  05/13/2016 FINDINGS: Placement of intra medullary nail and femoral neck screw. The IM nail crosses the herniated area within the mid shaft. There is a cortical defect noted laterally, new since prior study, possibly biopsy site. No additional interval change since prior study. IMPRESSION: Intra medullary nail placed across the permeative lesion  in the mid left femur. Cortical defect laterally presumably represents biopsy site. Electronically Signed   By: Rolm Baptise M.D.   On: 05/14/2016 15:38      IMPRESSION: 70 yo man with newly diagnosed multiple myeloma. He has symptomatic involvement in the femur and lumbar spine.  He may benefit from palliative radiotherapy to the left femur and lumbar spine following surgical recovery.  PLAN:  Today, I talked to the patient and family about the findings and work-up thus far.  We discussed the natural history of multiple myeloma with skeletal pain and general treatment, highlighting the role of radiotherapy in the management.  We discussed the available radiation techniques, and focused on the details of logistics and delivery.  We reviewed the anticipated acute and late sequelae associated with radiation in this setting.  The patient was encouraged to ask questions that I answered to the best of my ability.   We will follow progress following recovery from femur fixation and potentially offer palliative radiotherapy to the lumbar spine and left femur.   I spent 40 minutes minutes face to face with the patient and his family (separately, as he was in dialysis alone, and his family was in his room) and more than 50% of that time was spent in counseling and/or coordination of care.   ------------------------------------------------   Tyler Pita, MD [place] Director and Director of  Stereotactic Radiosurgery Direct Dial: [phone]  Fax: [phone] [email or web address]  Skype  [email or web address]

## 2016-05-16 NOTE — Sedation Documentation (Signed)
Patient is resting comfortably. Pt reports discomfort from prior surgical area

## 2016-05-16 NOTE — Progress Notes (Signed)
Chart review only. Patient is not seen. I review the patient's blood test results from 05/15/2016. Noted that he had received blood transfusion yesterday due to severe anemia. He is also noted to have severe vitamin D deficiency. Vitamin D replacement therapy is initiated. I reviewed the bone biopsy performed over the weekend by orthopedic surgeon. Unfortunately, the sample would not allow additional cytogenetics or FISH analysis to delineate the behavior of multiple myeloma in this patient. I recommend we proceed with bone marrow aspirate and biopsy today. As previously planned, I will be having family meeting at 1 PM on 05/17/2016 to review test results and treatment recommendation.

## 2016-05-16 NOTE — Sedation Documentation (Signed)
Patient denies pain and is resting comfortably.  

## 2016-05-16 NOTE — Progress Notes (Signed)
CC:  Chief Complaint  Patient presents with  . low hgb   Back Pain  HPI: Thomas [name] is a 70 y.o. male without routine medical followup who presented to the ED initially because of nausea and vomiting. Routine workup included CBC/BMP demonstrating AKI. Further evaluation revealed likely multiple myeloma. He has been having severe low back pain for the last few months. He was initially diagnosed with mild compression fracture in the upper lumbar spine, however MRI during admission found a large L5 lesion.   Currently, he continues to have back pain and left leg pain. He has undergone left femoral IM nail yesterday for impending pathologic fracture. Prior to his admission, he denies having bladder dysfunction. He has been essentially anuric since admission, and has not had a BM since surgery.  PMH: Past Medical History  Diagnosis Date  . Compression fracture     PSH: Past Surgical History  Procedure Laterality Date  . Mouth surgery    . Femur im nail Left 05/14/2016    Procedure: INTRAMEDULLARY (IM) NAIL FEMORAL;  Surgeon: Rod Can, MD;  Location: [facility];  Service: Orthopedics;  Laterality: Left;  . Bone biopsy Left 05/14/2016    Procedure: LEFT FEMORAL BIOPSY WITH INTRAOPERATIVE FROZEN SECTIONS ;  Surgeon: Rod Can, MD;  Location: [facility];  Service: Orthopedics;  Laterality: Left;    SH: Social History  Substance Use Topics  . Smoking status: Former Research scientist (life sciences)  . Smokeless tobacco: None  . Alcohol Use: No    MEDS: Prior to Admission medications   Not on File    ALLERGY: Allergies  Allergen Reactions  . Codeine Other (See Comments)    "MAKES ME JUMPY"  . Penicillins     Has patient had a PCN reaction causing immediate rash, facial/tongue/throat swelling, SOB or lightheadedness with hypotension: Yes Has patient had a PCN reaction causing severe rash involving mucus membranes or skin necrosis: Yes Has patient had a PCN reaction that required hospitalization: No Has  patient had a PCN reaction occurring within the last 10 years: No If all of the above answers are "NO", then may proceed with Cephalosporin use.     ROS: ROS  NEUROLOGIC EXAM: Awake, alert, oriented Memory and concentration grossly intact Speech fluent, appropriate CN grossly intact Motor exam: Upper Extremities Deltoid Bicep Tricep Grip  Right 5/5 5/5 5/5 5/5  Left 5/5 5/5 5/5 5/5   Lower Extremity IP Quad PF DF EHL  Right 5/5 5/5 5/5 5/5 5/5  Left Unable to test due to pain Unable to test due to pain 4/5 (pain limited) 2/5 (pain limited) 4/5   Sensation grossly intact to LT  [facility]: MRI total spine without contrast demonstrates primary finding at L5 where there is a large left-sided osseous lesion extending into the pedicle and posterior elements and a large portion extending into the psoas. Sagittal and coronal images appear to demonstrate moderate canal stenosis.  IMPRESSION: - 70 y.o. male with newly diagnosed myeloma and large L5 lesion. He has significant pain, but appears to be largely neurologically intact. He therefore doesn't need operative decompression, but would consider radiation therapy  PLAN: - I will contact Rad Onc to evaluate the patient in regards to RT for the L5 lesion - Weight bearing per orthopaedic surgery

## 2016-05-16 NOTE — Progress Notes (Signed)
Orthopedics Progress Note  Subjective: Feeling better today, less leg pain  Objective:  Filed Vitals:   05/16/16 0005 05/16/16 0602  BP: 111/52 124/61  Pulse: 104 94  Temp: 99.3 F (37.4 C) 98.9 F (37.2 C)  Resp: 14 16    General: Awake and alert  Musculoskeletal: left thigh compression dressing in place. Compartments soft. No bruising or drainage Calf supple with no cords. Neg Homan's Good ankle pumps Neurovascularly intact  Lab Results  Component Value Date   WBC 5.9 05/15/2016   HGB 6.5* 05/15/2016   HCT 18.2* 05/15/2016   MCV 84.7 05/15/2016   PLT 95* 05/15/2016       Component Value Date/Time   NA 130* 05/15/2016 0743   K 5.3* 05/15/2016 0743   CL 98* 05/15/2016 0743   CO2 22 05/15/2016 0743   GLUCOSE 110* 05/15/2016 0743   BUN 39* 05/15/2016 0743   CREATININE 9.26* 05/15/2016 0743   CALCIUM 7.7* 05/15/2016 0743   GFRNONAA 5* 05/15/2016 0743   GFRAA 6* 05/15/2016 0743    Lab Results  Component Value Date   INR 1.46 05/11/2016   INR 1.40 05/10/2016    Assessment/Plan: s/p Procedure(s): INTRAMEDULLARY (IM) NAIL FEMORAL LEFT FEMORAL BIOPSY WITH INTRAOPERATIVE FROZEN SECTIONS  Continue gentle mobilization with therapy and AROM hip knee and ankle  Doran Heater. Veverly Fells, MD 05/16/2016 7:48 AM

## 2016-05-16 NOTE — Anesthesia Postprocedure Evaluation (Signed)
Anesthesia Post Note  Patient: Thomas [name]  Procedure(s) Performed: Procedure(s) (LRB): INTRAMEDULLARY (IM) NAIL FEMORAL (Left) LEFT FEMORAL BIOPSY WITH INTRAOPERATIVE FROZEN SECTIONS  (Left)  Patient location during evaluation: PACU Anesthesia Type: General Level of consciousness: awake and alert and patient cooperative Pain management: pain level controlled Vital Signs Assessment: post-procedure vital signs reviewed and stable Respiratory status: spontaneous breathing and respiratory function stable Cardiovascular status: stable Anesthetic complications: no    Last Vitals:  Filed Vitals:   05/16/16 1233 05/16/16 1310  BP:  127/54  Pulse:  107  Temp: 36.8 C 37.1 C  Resp: 17 17    Last Pain:  Filed Vitals:   05/16/16 1313  PainSc: 0-No pain                 [name]

## 2016-05-16 NOTE — Progress Notes (Signed)
S: Wants "real food"   Had BM Bx this AM O:BP 101/54 mmHg  Pulse 106  Temp(Src) 98.9 F (37.2 C) (Oral)  Resp 16  Ht 6' 1"  (1.854 m)  Wt 75.206 kg (165 lb 12.8 oz)  BMI 21.88 kg/m2  SpO2 94%  Intake/Output Summary (Last 24 hours) at 05/16/16 1003 Last data filed at 05/16/16 0651  Gross per 24 hour  Intake   3405 ml  Output    330 ml  Net   3075 ml   Weight change: -0.3 kg (-10.6 oz) HFW:YOVZC and alert CVS: RRR Resp: clear Abd:+ BS NTND Ext:No edema NEURO:CNI Ox3 no asterixis Rt IJ PC   . sodium chloride   Intravenous Once  . sodium chloride   Intravenous Once  . [START ON 05/19/2016] darbepoetin (ARANESP) injection - DIALYSIS  100 mcg Intravenous Q Fri-HD  . docusate sodium  100 mg Oral BID  . feeding supplement (NEPRO CARB STEADY)  237 mL Oral BID BM  . fentaNYL      . lidocaine      . midazolam      . multivitamin  1 tablet Oral QHS  . vitamin B-12  1,000 mcg Oral Daily   Pelvis Portable  05/14/2016  CLINICAL DATA:  Left IM nail.  Postop pain. EXAM: PORTABLE PELVIS 1-2 VIEWS COMPARISON:  None. FINDINGS: Partially images the hardware within the proximal left femur. Hip joints and SI joints are symmetric and unremarkable. No acute fracture, subluxation or dislocation. IMPRESSION: No acute bony abnormality. Electronically Signed   By: Rolm Baptise M.D.   On: 05/14/2016 15:37   Dg C-arm 61-120 Min  05/14/2016  CLINICAL DATA:  Elective surgery.  Left IM nail. EXAM: DG C-ARM 61-120 MIN; LEFT FEMUR 2 VIEWS COMPARISON:  05/13/2016 FINDINGS: Placement of IM nail within the left femur, bridging the permeative lesions seen on prior plain films. Hip screw in place as well. No hardware or bony complicating feature. IMPRESSION: Placement of hip screw and left femoral IM nail as above. No visible complicating feature. Electronically Signed   By: Rolm Baptise M.D.   On: 05/14/2016 13:45   Mr Total Spine Mets Screening  05/16/2016  CLINICAL DATA:  Initial evaluation for metastatic disease,  presumed multiple myeloma. EXAM: MRI TOTAL SPINE WITHOUT CONTRAST TECHNIQUE: Multisequence MR imaging of the spine from the cervical spine to the sacrum was performed prior to and following IV contrast administration for evaluation of spinal metastatic disease. CONTRAST:  None. COMPARISON:  None. FINDINGS: Cervical Findings: Limited sagittal views of the cervical spine demonstrate a diffusely abnormal marrow pattern with multiple osseous lesions present, concerning for multiple myeloma. The most prevalent of these is present in the dens which demonstrates fairly diffuse abnormal STIR signal intensity (series 6, image 6). No significant extra osseous extension of tumor. No pathologic fracture. Scattered mild degenerative disc bulging throughout the cervical spine with posterior element hypertrophy. Associated diffuse canal narrowing without severe canal stenosis. No paraspinous soft tissue mass appreciated. Thoracic Findings: Limited sagittal views of the thoracic spine demonstrate diffusely abnormal marrow pattern with multiple osseous lesions present, again highly concerning for multiple myeloma. Most prominent lesion present within the T3 vertebral body and measures 19 mm. No significant extra osseous extension of tumor identified on this limited exam. No associated pathologic fracture. No paraspinous soft tissue mass appreciated. Lumbar Findings: There are 6 non rib-bearing lumbar type vertebral bodies. The lowest vertebral body is labeled L6. Vertebral body count is made from the odontoid week. Limited  sagittal views of the lumbar spine demonstrate diffusely abnormal marrow pattern with innumerable osseous lesions present, highly concerning for multiple myeloma. These are present throughout the lumbar spine and sacrum. There is associated probable pathologic fracture involving the L3 vertebral body with approximately 50% of central height loss. No significant bony retropulsion. Mild compression deformity at the  superior endplate of L2, likely related to chronic Schmorl's node. Large metastatic/myelomatous lesion involves the L5 vertebral body with extension into the left posterior elements. This lesion completely involves the central and left aspect of the L5 vertebral body with destruction of the left posterior elements and extension into the paraspinous soft tissues. Lesion measures approximately 6.0 x 9.0 x 7.5 cm (series 12, image 10). Abnormal posterior convex bowing of the L5 vertebral body with moderate to severe canal stenosis (series 10, image 7). There is extra osseous extension of tumor into the adjacent left L4-5 and L5-6 neural foramina with secondary mild narrowing at L4-5 and more moderate to severe narrowing at L5-6 (series 9, image 12). A second large ovoid lesion involving the left iliac wing is partially visualized (series 12, image 13). No other definite paraspinous soft tissues mass. T2 hyperintense cyst noted within the left kidney. IMPRESSION: 1. 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 Careful correlation to the vertebral body count on this exam is recommended prior to any potential future intervention. 2. Diffusely abnormal marrow signal intensity with innumerable osseous lesions present throughout the spine, concerning for multiple myeloma given the provided history. 3. Large 6.0 x 9.0 x 7.5 cm lesion with associated extraosseous extension of tumor into the left paraspinous soft tissues at L5. Abnormal posterior convex bowing of the L5 vertebral body with moderate to severe canal stenosis. Neural foraminal encroachment on the left at L4-5 and L5-6 as well. No other significant canal or neural foraminal encroachment within the lumbar spine. 4. No significant extraosseous tumor or or spinal canal encroachment within the cervical and thoracic spine. 5. Compression deformity of L3 with up to 50% height loss, which may be pathologic in nature. No bony  retropulsion. 6. Additional large lesion with extraosseous extension of tumor involving the left iliac wing, incompletely visualized on this exam. Electronically Signed   By: Jeannine Boga M.D.   On: 05/16/2016 00:51   Dg Femur Min 2 Views Left  05/14/2016  CLINICAL DATA:  Elective surgery.  Left IM nail. EXAM: DG C-ARM 61-120 MIN; LEFT FEMUR 2 VIEWS COMPARISON:  05/13/2016 FINDINGS: Placement of IM nail within the left femur, bridging the permeative lesions seen on prior plain films. Hip screw in place as well. No hardware or bony complicating feature. IMPRESSION: Placement of hip screw and left femoral IM nail as above. No visible complicating feature. Electronically Signed   By: Rolm Baptise M.D.   On: 05/14/2016 13:45   Dg Femur Port Min 2 Views Left  05/14/2016  CLINICAL DATA:  IM nail placement.  Postop pain. EXAM: LEFT FEMUR PORTABLE 2 VIEWS COMPARISON:  05/13/2016 FINDINGS: Placement of intra medullary nail and femoral neck screw. The IM nail crosses the herniated area within the mid shaft. There is a cortical defect noted laterally, new since prior study, possibly biopsy site. No additional interval change since prior study. IMPRESSION: Intra medullary nail placed across the permeative lesion in the mid left femur. Cortical defect laterally presumably represents biopsy site. Electronically Signed   By: Rolm Baptise M.D.   On: 05/14/2016 15:38   BMET    Component Value  Date/Time   NA 130* 05/15/2016 0743   K 5.3* 05/15/2016 0743   CL 98* 05/15/2016 0743   CO2 22 05/15/2016 0743   GLUCOSE 110* 05/15/2016 0743   BUN 39* 05/15/2016 0743   CREATININE 9.26* 05/15/2016 0743   CALCIUM 7.7* 05/15/2016 0743   GFRNONAA 5* 05/15/2016 0743   GFRAA 6* 05/15/2016 0743   CBC    Component Value Date/Time   WBC 5.9 05/15/2016 0743   RBC 2.15* 05/15/2016 0743   HGB 6.5* 05/15/2016 0743   HCT 18.2* 05/15/2016 0743   HCT 22.8* 05/10/2016 1156   PLT 95* 05/15/2016 0743   MCV 84.7 05/15/2016  0743   MCH 30.2 05/15/2016 0743   MCHC 35.7 05/15/2016 0743   RDW 13.2 05/15/2016 0743   LYMPHSABS 1.1 05/09/2016 2144   MONOABS 0.6 05/09/2016 2144   EOSABS 0.1 05/09/2016 2144   BASOSABS 0.0 05/09/2016 2144     Assessment: 1. 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 MM 3. Comp fx SP ORIF Plan:  1. HD today and give tomorrow off 2. Vein map arms in anticipation of AVF   Kensli Bowley T

## 2016-05-16 NOTE — Progress Notes (Signed)
Nutrition Follow-up  DOCUMENTATION CODES:   Non-severe (moderate) malnutrition in context of chronic illness  INTERVENTION:  Continue Nepro Shake po BID, each supplement provides 425 kcal and 19 grams protein.  Encourage adequate PO intake.   NUTRITION DIAGNOSIS:   Malnutrition related to chronic illness as evidenced by moderate depletions of muscle mass, moderate depletion of body fat; ongoing  GOAL:   Patient will meet greater than or equal to 90% of their needs; met  MONITOR:   PO intake, Supplement acceptance, Weight trends, Labs, I & O's  REASON FOR ASSESSMENT:   Malnutrition Screening Tool    ASSESSMENT:   70 y.o. male with medical history significant of not seeing a physician in many years. Patient has been on very high dose NSAIDS recently due to development of back pain. After his family member found out about this they cut down on the amount of NSAIDs he was taking. However over the past 1 month or so he has had almost daily emesis in the morning. In the ED the patient is found to be in frank renal failure with creatinine >10. Lab work showing hyperproteinemia, abnormal lucency of lumbar vertebrae and multiple radiolucencies in the skull x-ray indicated towards multiple myeloma. Also found to have pathological fracture of left femur and underwent IM nailing. Receiving hemodialysis since 6/2. Bone marrow biopsy done today.   Meal completion has been mostly varied from 25-100% with most intake at 70-100%. Diet has just been advanced to a regular diet. Pt currently has Nepro Shake ordered and has been consuming them. RD to continue with current orders. Labs and medications reviewed.   Diet Order:  Diet regular Room service appropriate?: Yes; Fluid consistency:: Thin  Skin:  Reviewed, no issues  Last BM:  5/31  Height:   Ht Readings from Last 1 Encounters:  05/10/16 6' 1"  (1.854 m)    Weight:   Wt Readings from Last 1 Encounters:  05/16/16 170 lb 3.1 oz (77.2  kg)    Ideal Body Weight:  83.6 kg  BMI:  Body mass index is 22.46 kg/(m^2).  Estimated Nutritional Needs:   Kcal:  2050-2250  Protein:  100-110 grams  Fluid:  Per MD  EDUCATION NEEDS:   Education needs addressed  Corrin Parker, MS, RD, LDN Pager # [phone] After hours/ weekend pager # [phone]

## 2016-05-16 NOTE — Progress Notes (Addendum)
PROGRESS NOTE                                                                                                                                                                                                             Patient Demographics:    Thomas [name], is a 70 y.o. male, DOB - 28-Apr-1946, VEL:[id number]  Admit date - 05/09/2016   Admitting Physician Etta Quill, DO  Outpatient Primary MD for the patient is No PCP Per Patient  LOS - 6  Outpatient Specialists: none  Chief Complaint  Patient presents with  . low hgb        Brief Narrative   70 year old male with no significant medical history (had not seen a physician in many years) presented with about 1 month of nausea with daily vomiting. Also reports poor by mouth intake and subjective weight loss. Denied any diarrhea, hematemesis or bleeding per rectum. Patient went to the urgent care and was found to have a hemoglobin of 9.8 and sent to the ED. In the ED blood work showed creatinine >10 and BUN of 40. He was taking high dose of NSAIDs about 3 months back for severe low back pain, prescribed by his chiropractor. Reports that he told them for less than 2 weeks. He was also found to have severe hyperkalemia and given insulin with dextrose. Admitted to hospitalist service for further management.  Lab work showing hyperproteinemia, abnormal lucency of lumbar vertebrae and multiple radiolucencies in the skull x-ray indicated towards multiple myeloma. Also found to have pathological fracture of left femur and underwent IM nailing.    Subjective:   Appears fatigued. MRI of the total spine shows large left-sided osseous lesion extending into the L5 pedicle with moderate canal stenosis.   Assessment  & Plan :    Principal Problem:  Acute versus acute on chronic kidney disease Concern for AKI due  to myeloma with / without AIN from high dose NSAIDs use. -ANA,  complements,  ANCA and hepatitis panel negative. -SPEP, light chains and  IFE indicated towards multiple myeloma.. -Renal biopsy  results Still pending. Receiving hemodialysis since 6/2. -renal consult appreciated.   Active Problems: multiple myeloma with diffuse skeletal lesions -Bone survey showing metastatic bone lesions with largest lesion involving left iliac wing. Also lesion involving midshaft of left femur with pathological fracture. L3 and  L5 compression fracture. - MRI of the spine shows large lumbar lesion associated with extraosseous extension of tumor into the left paraspinous soft tissue at L5 with moderate to severe canal stenosis. Evaluated by neurosurgery and given no neurological deficit recommends no surgical intervention. Recommend radiation therapy. ( rad onc consulted) -Dr. Alvy Bimler following. Bone marrow biopsy done on 6/6. Dr. Alvy Bimler will meet with the family tomorrow for further treatment plan.  Pathological fracture of left femur  IM nailing with open biopsy of the left femur with intraoperative frozen section done on 6/6.Marland Kitchen Results pending. continue pain control.  Anemia with thrombocytopenia.  Secondary to myeloma. .Has received several units of PRBC. Progressive thrombocytopenia. Platelet function assay is high. Avoid heparin products.  Received 7 u PRBC so far  and 2 u platelets. Recheck labs in a.m.     Vitamin B12 deficiency Significantly low levels (106). Added supplements.   Hyperkalemia Improved after IV insulin and dextrose. Now on dialysis.    Malnutrition of moderate degree        Code Status : Full code  Family Communication  : Wife  and daughter at bedside  Disposition Plan  : Currently inpatient.  Barriers For Discharge : Active symptoms. Requiring dialysis.  Consults  :  Nephrology (Dr. Ransom Footman) IR Oncology (Dr. Alvy Bimler) Orthopedics (Dr Delfino Lovett) Neurosurgery Radiation onc ( Dr Tammi Klippel)  Procedures  :  Renal  ultrasound Dialysis catheter placement Left Renal biopsy IM nailing of left femur fracture MRI spine  Bone marrow biopsy on 6/6  DVT Prophylaxis  :  SCDs   Lab Results  Component Value Date   PLT 93* 05/16/2016    Antibiotics  :  None  Anti-infectives    Start     Dose/Rate Route Frequency Ordered Stop   05/14/16 1915  clindamycin (CLEOCIN) IVPB 600 mg     600 mg 100 mL/hr over 30 Minutes Intravenous Every 6 hours 05/14/16 1912 05/15/16 0131   05/14/16 1200  clindamycin (CLEOCIN) IVPB 900 mg     900 mg 100 mL/hr over 30 Minutes Intravenous To Surgery 05/14/16 0055 05/14/16 1121   05/11/16 1225  vancomycin (VANCOCIN) 1-5 GM/200ML-% IVPB  Status:  Discontinued    Comments:  Shelton, Whitney   : cabinet override      05/11/16 1225 05/11/16 1234   05/11/16 1100  vancomycin (VANCOCIN) IVPB 1000 mg/200 mL premix     1,000 mg 200 mL/hr over 60 Minutes Intravenous To Radiology 05/11/16 0911 05/11/16 1354        Objective:   Filed Vitals:   05/16/16 1233 05/16/16 1310 05/16/16 1418 05/16/16 1433  BP:  127/54 130/62   Pulse:  107 102 100  Temp: 98.3 F (36.8 C) 98.7 F (37.1 C) 98.3 F (36.8 C)   TempSrc: Oral Oral Oral   Resp: _0 Height:      Weight:      SpO2: 98% 100% 100% 99%    Wt Readings from Last 3 Encounters:  05/15/16 75.206 kg (165 lb 12.8 oz)     Intake/Output Summary (Last 24 hours) at 05/16/16 1517 Last data filed at 05/16/16 1300  Gross per 24 hour  Intake    600 ml  Output      0 ml  Net    600 ml     Physical Exam  Gen: fatigued, not in distress HEENT:  moist mucosa, supple neck Chest: clear b/l, no added sounds, Right-sided dialysis catheter CVS: N S1&S2, no murmurs, rubs  or gallop GI: soft, NT, ND, BS+,  Musculoskeletal: warm, no edema, Ace wrap over left leg CNS: Alert and oriented, Nonfocal    Data Review:    CBC  Recent Labs Lab 05/09/16 2144  05/13/16 1841 05/14/16 0517 05/14/16 1812 05/15/16 0743  05/16/16 0949  WBC 6.2  < > 4.1 4.4 6.8 5.9 7.4  HGB 9.2*  < > 9.8* 9.7* 7.6* 6.5* 7.3*  HCT 26.5*  < > 29.0* 28.5* 22.1* 18.2* 21.8*  PLT 105*  < > 59* 56* 99* 95* 93*  MCV 85.8  < > 85.3 85.3 87.4 84.7 89.7  MCH 29.8  < > 28.8 29.0 30.0 30.2 30.0  MCHC 34.7  < > 33.8 34.0 34.4 35.7 33.5  RDW 12.3  < > 13.3 13.6 13.3 13.2 13.8  LYMPHSABS 1.1  --   --   --   --   --   --   MONOABS 0.6  --   --   --   --   --   --   EOSABS 0.1  --   --   --   --   --   --   BASOSABS 0.0  --   --   --   --   --   --   < > = values in this interval not displayed.  Chemistries   Recent Labs Lab 05/10/16 0310  05/13/16 1841 05/14/16 0517 05/14/16 1812 05/15/16 0743 05/16/16 0949  NA  --   < > 127* 129* 130* 130* 134*  K  --   < > 4.1 4.4 5.2* 5.3* 4.6  CL  --   < > 95* 97* 101 98* 100*  CO2  --   < > _0 GLUCOSE  --   < > 103* 79 136* 110* 92  BUN  --   < > 22* 27* 31* 39* 25*  CREATININE  --   < > 7.31* 8.31* 8.32* 9.26* 6.84*  CALCIUM  --   < > 7.8* 8.2* 7.6* 7.7* 7.9*  MG 1.9  --   --   --   --   --   --   AST 13*  --   --   --   --   --   --   ALT 9*  --   --   --   --   --   --   ALKPHOS 72  --   --   --   --   --   --   BILITOT 0.4  --   --   --   --   --   --   < > = values in this interval not displayed. ------------------------------------------------------------------------------------------------------------------ No results for input(s): CHOL, HDL, LDLCALC, TRIG, CHOLHDL, LDLDIRECT in the last 72 hours.  No results found for: HGBA1C ------------------------------------------------------------------------------------------------------------------ No results for input(s): TSH, T4TOTAL, T3FREE, THYROIDAB in the last 72 hours.  Invalid input(s): FREET3 ------------------------------------------------------------------------------------------------------------------ No results for input(s): VITAMINB12, FOLATE, FERRITIN, TIBC, IRON, RETICCTPCT in the last 72  hours.  Coagulation profile  Recent Labs Lab 05/10/16 1156 05/11/16 0500  INR 1.40 1.46    No results for input(s): DDIMER in the last 72 hours.  Cardiac Enzymes No results for input(s): CKMB, TROPONINI, MYOGLOBIN in the last 168 hours.  Invalid input(s): CK ------------------------------------------------------------------------------------------------------------------ No results found for: BNP  Inpatient Medications  Scheduled Meds: . sodium chloride   Intravenous Once  . sodium chloride   Intravenous  Once  . [START ON 05/19/2016] darbepoetin (ARANESP) injection - DIALYSIS  100 mcg Intravenous Q Fri-HD  . docusate sodium  100 mg Oral BID  . feeding supplement (NEPRO CARB STEADY)  237 mL Oral BID BM  . fentaNYL      . lidocaine      . midazolam      . multivitamin  1 tablet Oral QHS  . vitamin B-12  1,000 mcg Oral Daily   Continuous Infusions: . sodium chloride    . sodium chloride     PRN Meds:.  Micro Results Recent Results (from the past 240 hour(s))  Surgical pcr screen     Status: None   Collection Time: 05/14/16  1:00 AM  Result Value Ref Range Status   MRSA, PCR NEGATIVE NEGATIVE Final   Staphylococcus aureus NEGATIVE NEGATIVE Final    Comment:        The Xpert SA Assay (FDA approved for NASAL specimens in patients over 1 years of age), is one component of a comprehensive surveillance program.  Test performance has been validated by [facility] for patients greater than or equal to 35 year old. It is not intended to diagnose infection nor to guide or monitor treatment.     Radiology Reports Dg Skull 1-3 Views  05/11/2016  CLINICAL DATA:  Myeloma EXAM: SKULL - 1-3 VIEW COMPARISON:  None. FINDINGS: Frontal view shows 13 mm lucency in the parasagittal left skull. On the lateral view approximately 10 round or oval lucencies are identified, the largest measuring 14 mm in maximal diameter. There are also lucencies in both mandibles. IMPRESSION:  Multiple radiolucencies consistent with myeloma. Electronically Signed   By: Skipper Cliche M.D.   On: 05/11/2016 10:42   Dg Chest 2 View  05/10/2016  CLINICAL DATA:  Congestive heart failure. EXAM: CHEST  2 VIEW COMPARISON:  None. FINDINGS: The heart size and mediastinal contours are within normal limits. No pneumothorax or pleural effusion is noted. Right lung is clear. Density is seen in left lower lobe which may represent either pleural-based mass or possibly expansile lesion within left posterior rib. The visualized skeletal structures are unremarkable. IMPRESSION: Left lower lobe density is noted representing pleural-based mass or expansile lesion within left posterior rib. CT scan of the chest is recommended to evaluate for possible neoplasm or malignancy. Electronically Signed   By: Marijo Conception, M.D.   On: 05/10/2016 13:08   Dg Lumbar Spine 2-3 Views  05/10/2016  CLINICAL DATA:  Lower lumbar pain radiating down the left leg. Injury in February 2017. EXAM: LUMBAR SPINE - 2-3 VIEW COMPARISON:  None. FINDINGS: Transitional S1 vertebra. Abnormal lucency in the left L5 vertebra appreciable on the frontal projection, potentially with some slight compression in this vicinity, but with lucency out of proportion to the compression. Irregular margins of the cortex of the S1 vertebra on the lateral projection. Anterior wedge compression fracture at L3 with 30% loss of vertebral body height. Aortoiliac atherosclerotic vascular disease. IMPRESSION: 1. Compression fractures at L3 and L5, with abnormal lucency in the left side of the L5 vertebral body raising concern for multiple myeloma or lytic metastatic disease from a radiographic standpoint. MRI could be utilized for further characterization if clinically warranted. 2. There is also cortical thickening and irregularity of the S1 vertebra, underlying malignancy not excluded at S 1. 3. 30% compression fracture at L 3, age indeterminate. 4. Lumbar spondylosis.  5.  Aortoiliac atherosclerotic vascular disease. Electronically Signed   By: Van Clines  M.D.   On: 05/10/2016 16:48   US Renal  05/10/2016  CLINICAL DATA:  Acute kidney injury. EXAM: RENAL / URINARY TRACT ULTRASOUND COMPLETE COMPARISON:  None. FINDINGS: Right Kidney: Length: 11.4 cm. Echogenicity within normal limits. No mass or hydronephrosis visualized. Left Kidney: Length: 11.6 cm. Echogenicity within normal limits. There is a 2.3 x 2.1 x 2.1 cm cyst in the upper pole. No fall with mass or hydronephrosis visualized. Bladder: Appears normal for degree of bladder distention. IMPRESSION: 1. Left renal cyst.  Otherwise unremarkable renal ultrasound. 2. No obstructive uropathy. Electronically Signed   By: Jeb Levering M.D.   On: 05/10/2016 04:29   Pelvis Portable  05/14/2016  CLINICAL DATA:  Left IM nail.  Postop pain. EXAM: PORTABLE PELVIS 1-2 VIEWS COMPARISON:  None. FINDINGS: Partially images the hardware within the proximal left femur. Hip joints and SI joints are symmetric and unremarkable. No acute fracture, subluxation or dislocation. IMPRESSION: No acute bony abnormality. Electronically Signed   By: Rolm Baptise M.D.   On: 05/14/2016 15:37   Ir Fluoro Guide Cv Line Right  05/12/2016  INDICATION: 70 year old with renal failure.  Catheter needed for hemodialysis. EXAM: FLUOROSCOPIC AND ULTRASOUND GUIDED PLACEMENT OF A TUNNELED DIALYSIS CATHETER Physician: Stephan Minister. Anselm Pancoast, MD MEDICATIONS: Vancomycin 1 g; The antibiotic was administered within an appropriate time interval prior to skin puncture. ANESTHESIA/SEDATION: Versed 1.5 mg IV; Fentanyl 50 mcg IV; Moderate Sedation Time:  34 The patient was continuously monitored during the procedure by the interventional radiology nurse under my direct supervision. FLUOROSCOPY TIME:  Fluoroscopy Time: 36 seconds, 4 mGy COMPLICATIONS: None immediate. PROCEDURE: Informed consent was obtained for placement of a tunneled dialysis catheter. The patient was placed  supine on the interventional table. Ultrasound confirmed a patent right internal jugular vein. Ultrasound images were obtained for documentation. The right side of the neck was prepped and draped in a sterile fashion. The right side of the neck was anesthetized with 1% lidocaine. Maximal barrier sterile technique was utilized including caps, mask, sterile gowns, sterile gloves, sterile drape, hand hygiene and skin antiseptic. A small incision was made with #11 blade scalpel. A 21 gauge needle directed into the right internal jugular vein with ultrasound guidance. A micropuncture dilator set was placed. A 19 cm tip to cuff Palindrome catheter was selected. The skin below the right clavicle was anesthetized and a small incision was made with an #11 blade scalpel. A subcutaneous tunnel was formed to the vein dermatotomy site. The catheter was brought through the tunnel. The vein dermatotomy site was dilated to accommodate a peel-away sheath. The catheter was placed through the peel-away sheath and directed into the central venous structures. The tip of the catheter was placed at the superior cavoatrial junction with fluoroscopy. Fluoroscopic images were obtained for documentation. Both lumens were found to aspirate and flush well. The proper amount of heparin was flushed in both lumens. The vein dermatotomy site was closed using a single layer of absorbable suture and Dermabond. The catheter was secured to the skin using Prolene suture. Fluoroscopic and ultrasound images were taken and saved for documentation. IMPRESSION: Successful placement of a right jugular tunneled dialysis catheter using ultrasound and fluoroscopic guidance. Electronically Signed   By: Markus Daft M.D.   On: 05/12/2016 06:47   Ir US Guide Vasc Access Right  05/12/2016  INDICATION: 70 year old with renal failure.  Catheter needed for hemodialysis. EXAM: FLUOROSCOPIC AND ULTRASOUND GUIDED PLACEMENT OF A TUNNELED DIALYSIS CATHETER Physician: Stephan Minister. Anselm Pancoast, MD MEDICATIONS: Vancomycin  1 g; The antibiotic was administered within an appropriate time interval prior to skin puncture. ANESTHESIA/SEDATION: Versed 1.5 mg IV; Fentanyl 50 mcg IV; Moderate Sedation Time:  34 The patient was continuously monitored during the procedure by the interventional radiology nurse under my direct supervision. FLUOROSCOPY TIME:  Fluoroscopy Time: 36 seconds, 4 mGy COMPLICATIONS: None immediate. PROCEDURE: Informed consent was obtained for placement of a tunneled dialysis catheter. The patient was placed supine on the interventional table. Ultrasound confirmed a patent right internal jugular vein. Ultrasound images were obtained for documentation. The right side of the neck was prepped and draped in a sterile fashion. The right side of the neck was anesthetized with 1% lidocaine. Maximal barrier sterile technique was utilized including caps, mask, sterile gowns, sterile gloves, sterile drape, hand hygiene and skin antiseptic. A small incision was made with #11 blade scalpel. A 21 gauge needle directed into the right internal jugular vein with ultrasound guidance. A micropuncture dilator set was placed. A 19 cm tip to cuff Palindrome catheter was selected. The skin below the right clavicle was anesthetized and a small incision was made with an #11 blade scalpel. A subcutaneous tunnel was formed to the vein dermatotomy site. The catheter was brought through the tunnel. The vein dermatotomy site was dilated to accommodate a peel-away sheath. The catheter was placed through the peel-away sheath and directed into the central venous structures. The tip of the catheter was placed at the superior cavoatrial junction with fluoroscopy. Fluoroscopic images were obtained for documentation. Both lumens were found to aspirate and flush well. The proper amount of heparin was flushed in both lumens. The vein dermatotomy site was closed using a single layer of absorbable suture and Dermabond. The  catheter was secured to the skin using Prolene suture. Fluoroscopic and ultrasound images were taken and saved for documentation. IMPRESSION: Successful placement of a right jugular tunneled dialysis catheter using ultrasound and fluoroscopic guidance. Electronically Signed   By: Markus Daft M.D.   On: 05/12/2016 06:47   Ir US Guide Bx Asp/drain  05/12/2016  INDICATION: 70 year old with renal failure.  Request for renal biopsy. EXAM: ULTRASOUND-GUIDED RENAL BIOPSY MEDICATIONS: None. ANESTHESIA/SEDATION: Patient was monitored by radiology nurse throughout the procedure. Sedation was continued from the prior hemodialysis catheter placement. FLUOROSCOPY TIME:  None COMPLICATIONS: None immediate. PROCEDURE: Informed written consent was obtained from the patient after a thorough discussion of the procedural risks, benefits and alternatives. All questions were addressed. Maximal Sterile Barrier Technique was utilized including caps, mask, sterile gowns, sterile gloves, sterile drape, hand hygiene and skin antiseptic. A timeout was performed prior to the initiation of the procedure. Patient was placed prone. Both kidneys were evaluated with ultrasound. The right kidney was selected for biopsy. The right flank was prepped with chlorhexidine and a sterile field was created. Skin was anesthetized with 1% lidocaine. Using ultrasound guidance, 16 gauge core biopsy was obtained from the right kidney lower pole. Specimen placed in saline. A second ultrasound-guided core biopsy was obtained from the lower pole. Specimen was placed in saline. These samples were felt to be adequate. Bandage placed over the puncture site. FINDINGS: Two core biopsies obtained from the right kidney lower pole. Small perinephric hematoma identified following the procedure. This hematoma did not appear to be expanding based on post biopsy visualization. IMPRESSION: Ultrasound-guided core biopsies of the right kidney lower pole. Electronically Signed    By: Markus Daft M.D.   On: 05/12/2016 06:54   Ct Biopsy  05/16/2016  INDICATION: 70 year old male  with a history of multiple myeloma. EXAM: CT BIOPSY MEDICATIONS: None. ANESTHESIA/SEDATION: Moderate (conscious) sedation was employed during this procedure. A total of Versed 1.0 mg and Fentanyl 50 mcg was administered intravenously. Moderate Sedation Time: 15 minutes. The patient's level of consciousness and vital signs were monitored continuously by radiology nursing throughout the procedure under my direct supervision. FLUOROSCOPY TIME:  CT COMPLICATIONS: None PROCEDURE: The procedure risks, benefits, and alternatives were explained to the patient. Questions regarding the procedure were encouraged and answered. The patient understands and consents to the procedure. Scout CT of the pelvis was performed for surgical planning purposes. The posterior pelvis was prepped with Betadinein a sterile fashion, and a sterile drape was applied covering the operative field. A sterile gown and sterile gloves were used for the procedure. Local anesthesia was provided with 1% Lidocaine. We targeted the left posterior iliac bone for biopsy. The skin and subcutaneous tissues were infiltrated with 1% lidocaine without epinephrine. A small stab incision was made with an 11 blade scalpel, and an 11 gauge Murphy needle was advanced with CT guidance to the posterior cortex. Manual forced was used to advance the needle through the posterior cortex and the stylet was removed. A bone marrow aspirate was retrieved and passed to a cytotechnologist in the room. The Murphy needle was then advanced without the stylet for a core biopsy. The core biopsy was retrieved and also passed to a cytotechnologist. Manual pressure was used for hemostasis and a sterile dressing was placed. No complications were encountered no significant blood loss was encountered. Patient tolerated the procedure well and remained hemodynamically stable throughout.  IMPRESSION: Status post CT-guided bone marrow biopsy, with tissue specimen sent to pathology for complete histopathologic analysis Signed, Dulcy Fanny. Earleen Newport, DO [facility] Electronically Signed   By: Corrie Mckusick D.O.   On: 05/16/2016 10:09   Dg Bone Survey Met  05/12/2016  CLINICAL DATA:  Back pain and weakness. Evaluate for multiple myeloma and bone cancer. EXAM: METASTATIC BONE SURVEY COMPARISON:  05/10/2016 FINDINGS: Patient is edentulous. Subtle lucent areas in the calvarium are suspicious. Right jugular dialysis catheter tip in the lower SVC. No suspicious lesions in the upper extremities. Multilevel degenerativee facet disease in the cervical spine. Normal alignment of cervical spine. Prevertebral soft tissues are normal. Normal alignment of the thoracic spine. There appears to be 6 non rib-bearing vertebral bodies. Assuming that S1 is a transitional vertebral body, there is a suspicious lucent lesion along the left side of the L5 vertebral body and pedicle. Again noted is mild vertebral body height loss at L5. There is a stable superior compression deformity of the L3 vertebral body. Concern expansion of the left L1 pedicle. Cannot exclude a lesion at this location. Again noted is an expansile lesion involving the left eighth rib. This is better visualized on prior chest radiograph. Pelvic radiograph demonstrate a large lucent lesion involving the left iliac wing. This lesion measures up to 11 cm. Heterogeneous area of lucency in the midshaft of the left femur is concerning for a lesion. There is cortical thinning in this area. No suspicious lesions in the right lower extremity. IMPRESSION: Multiple bone lesions are suggestive for metastatic bone disease and suspicious for multiple myeloma. Largest lesion is involving the left iliac wing and this would be the most amenable to biopsy. There is also an expansile lesion involving the left eighth rib  and a lucent lesion involving the L5 left pedicle. Question a lesion of the left  L1 pedicle. Probable small calvarial lesions. Suspicious lucency involving the midshaft of the left femur with cortical thinning. This is concerning for a metastatic bone lesion and would be at risk for a pathologic fracture. L3 compression fracture.  Questionable compression fracture at L5. These results were called by telephone at the time of interpretation on 05/12/2016 at 4:47 pm to Dr. Clementeen Graham, who verbally acknowledged these results. Electronically Signed   By: Markus Daft M.D.   On: 05/12/2016 16:47   Dg C-arm 61-120 Min  05/14/2016  CLINICAL DATA:  Elective surgery.  Left IM nail. EXAM: DG C-ARM 61-120 MIN; LEFT FEMUR 2 VIEWS COMPARISON:  05/13/2016 FINDINGS: Placement of IM nail within the left femur, bridging the permeative lesions seen on prior plain films. Hip screw in place as well. No hardware or bony complicating feature. IMPRESSION: Placement of hip screw and left femoral IM nail as above. No visible complicating feature. Electronically Signed   By: Rolm Baptise M.D.   On: 05/14/2016 13:45   Mr Total Spine Mets Screening  05/16/2016  CLINICAL DATA:  Initial evaluation for metastatic disease, presumed multiple myeloma. EXAM: MRI TOTAL SPINE WITHOUT CONTRAST TECHNIQUE: Multisequence MR imaging of the spine from the cervical spine to the sacrum was performed prior to and following IV contrast administration for evaluation of spinal metastatic disease. CONTRAST:  None. COMPARISON:  None. FINDINGS: Cervical Findings: Limited sagittal views of the cervical spine demonstrate a diffusely abnormal marrow pattern with multiple osseous lesions present, concerning for multiple myeloma. The most prevalent of these is present in the dens which demonstrates fairly diffuse abnormal STIR signal intensity (series 6, image 6). No significant extra osseous extension of tumor. No pathologic fracture. Scattered mild degenerative disc bulging  throughout the cervical spine with posterior element hypertrophy. Associated diffuse canal narrowing without severe canal stenosis. No paraspinous soft tissue mass appreciated. Thoracic Findings: Limited sagittal views of the thoracic spine demonstrate diffusely abnormal marrow pattern with multiple osseous lesions present, again highly concerning for multiple myeloma. Most prominent lesion present within the T3 vertebral body and measures 19 mm. No significant extra osseous extension of tumor identified on this limited exam. No associated pathologic fracture. No paraspinous soft tissue mass appreciated. Lumbar Findings: There are 6 non rib-bearing lumbar type vertebral bodies. The lowest vertebral body is labeled L6. Vertebral body count is made from the odontoid week. Limited sagittal views of the lumbar spine demonstrate diffusely abnormal marrow pattern with innumerable osseous lesions present, highly concerning for multiple myeloma. These are present throughout the lumbar spine and sacrum. There is associated probable pathologic fracture involving the L3 vertebral body with approximately 50% of central height loss. No significant bony retropulsion. Mild compression deformity at the superior endplate of L2, likely related to chronic Schmorl's node. Large metastatic/myelomatous lesion involves the L5 vertebral body with extension into the left posterior elements. This lesion completely involves the central and left aspect of the L5 vertebral body with destruction of the left posterior elements and extension into the paraspinous soft tissues. Lesion measures approximately 6.0 x 9.0 x 7.5 cm (series 12, image 10). Abnormal posterior convex bowing of the L5 vertebral body with moderate to severe canal stenosis (series 10, image 7). There is extra osseous extension of tumor into the adjacent left L4-5 and L5-6 neural foramina with secondary mild narrowing at L4-5 and more moderate to severe narrowing at L5-6 (series  9, image 12). A second large ovoid lesion involving the left iliac wing is partially visualized (series 12, image  13). No other definite paraspinous soft tissues mass. T2 hyperintense cyst noted within the left kidney. IMPRESSION: 1. 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 Careful correlation to the vertebral body count on this exam is recommended prior to any potential future intervention. 2. Diffusely abnormal marrow signal intensity with innumerable osseous lesions present throughout the spine, concerning for multiple myeloma given the provided history. 3. Large 6.0 x 9.0 x 7.5 cm lesion with associated extraosseous extension of tumor into the left paraspinous soft tissues at L5. Abnormal posterior convex bowing of the L5 vertebral body with moderate to severe canal stenosis. Neural foraminal encroachment on the left at L4-5 and L5-6 as well. No other significant canal or neural foraminal encroachment within the lumbar spine. 4. No significant extraosseous tumor or or spinal canal encroachment within the cervical and thoracic spine. 5. Compression deformity of L3 with up to 50% height loss, which may be pathologic in nature. No bony retropulsion. 6. Additional large lesion with extraosseous extension of tumor involving the left iliac wing, incompletely visualized on this exam. Electronically Signed   By: Jeannine Boga M.D.   On: 05/16/2016 00:51   Dg Femur Min 2 Views Left  05/14/2016  CLINICAL DATA:  Elective surgery.  Left IM nail. EXAM: DG C-ARM 61-120 MIN; LEFT FEMUR 2 VIEWS COMPARISON:  05/13/2016 FINDINGS: Placement of IM nail within the left femur, bridging the permeative lesions seen on prior plain films. Hip screw in place as well. No hardware or bony complicating feature. IMPRESSION: Placement of hip screw and left femoral IM nail as above. No visible complicating feature. Electronically Signed   By: Rolm Baptise M.D.   On: 05/14/2016 13:45   Dg  Femur Min 2 Views Left  05/13/2016  CLINICAL DATA:  Left femur pain anteriorly from the left hip to the knee. EXAM: LEFT FEMUR 2 VIEWS COMPARISON:  Bone survey 05/12/2016 FINDINGS: There is permeative lucency within the midshaft of the femur with ill-defined margins and cortical involvement as described on yesterday's study. This lesion involves a length of at least 6 cm. No displaced fracture is identified. The left hip and left knee are located. No soft tissue abnormality is seen. IMPRESSION: Permeative left midshaft femur lesion concerning for malignancy as described on yesterday's study. No fracture identified. Electronically Signed   By: Logan Bores M.D.   On: 05/13/2016 11:40   Dg Femur Port Min 2 Views Left  05/14/2016  CLINICAL DATA:  IM nail placement.  Postop pain. EXAM: LEFT FEMUR PORTABLE 2 VIEWS COMPARISON:  05/13/2016 FINDINGS: Placement of intra medullary nail and femoral neck screw. The IM nail crosses the herniated area within the mid shaft. There is a cortical defect noted laterally, new since prior study, possibly biopsy site. No additional interval change since prior study. IMPRESSION: Intra medullary nail placed across the permeative lesion in the mid left femur. Cortical defect laterally presumably represents biopsy site. Electronically Signed   By: Rolm Baptise M.D.   On: 05/14/2016 15:38    Time Spent in minutes  25   Louellen Molder M.D on 05/16/2016 at 3:17 PM  Between 7am to 7pm - Pager - [phone]  After 7pm go to [email or web address] - password [facility]  [facility] -  Office  [phone]

## 2016-05-16 NOTE — Care Management Important Message (Signed)
Important Message  Patient Details  Name: Thomas [name] MRN: [id number] Date of Birth: [birth year]   Medicare Important Message Given:  Yes    [name] 05/16/2016, 8:29 AM

## 2016-05-16 NOTE — Sedation Documentation (Signed)
Pt's BP declined to 89/55, Dr. Earleen Newport aware, 200 NS bolus currently infusion as discussed with Dr. Earleen Newport. Pt easily aroused to verbal stimuli, states "I feel all right," pt does moan with pressure applied to site. All other VSS, pt responding to verbal stimuli, will continue to monitor

## 2016-05-16 NOTE — Progress Notes (Signed)
OT Cancellation Note  Patient Details Name: Thomas [name] MRN: [id number] DOB: May 22, 1946   Cancelled Treatment:    Reason Eval/Treat Not Completed: Other (comment) Pt had biopsy this am and is to remain on bedrest for 3 hours.   Benito Mccreedy OTR/L [id number] 05/16/2016, 10:16 AM

## 2016-05-17 ENCOUNTER — Inpatient Hospital Stay (HOSPITAL_COMMUNITY): Payer: PPO

## 2016-05-17 ENCOUNTER — Encounter (HOSPITAL_COMMUNITY): Payer: Self-pay | Admitting: Orthopedic Surgery

## 2016-05-17 ENCOUNTER — Other Ambulatory Visit: Payer: Self-pay | Admitting: Hematology and Oncology

## 2016-05-17 DIAGNOSIS — N19 Unspecified kidney failure: Secondary | ICD-10-CM

## 2016-05-17 DIAGNOSIS — E559 Vitamin D deficiency, unspecified: Secondary | ICD-10-CM

## 2016-05-17 DIAGNOSIS — E46 Unspecified protein-calorie malnutrition: Secondary | ICD-10-CM

## 2016-05-17 DIAGNOSIS — K5900 Constipation, unspecified: Secondary | ICD-10-CM

## 2016-05-17 DIAGNOSIS — I770 Arteriovenous fistula, acquired: Secondary | ICD-10-CM

## 2016-05-17 DIAGNOSIS — D61818 Other pancytopenia: Secondary | ICD-10-CM

## 2016-05-17 DIAGNOSIS — D519 Vitamin B12 deficiency anemia, unspecified: Secondary | ICD-10-CM

## 2016-05-17 DIAGNOSIS — E538 Deficiency of other specified B group vitamins: Secondary | ICD-10-CM | POA: Diagnosis present

## 2016-05-17 LAB — RENAL FUNCTION PANEL
Albumin: 1.9 g/dL — ABNORMAL LOW (ref 3.5–5.0)
Anion gap: 11 (ref 5–15)
BUN: 14 mg/dL (ref 6–20)
CHLORIDE: 93 mmol/L — AB (ref 101–111)
CO2: 28 mmol/L (ref 22–32)
CREATININE: 4.49 mg/dL — AB (ref 0.61–1.24)
Calcium: 7.8 mg/dL — ABNORMAL LOW (ref 8.9–10.3)
GFR, EST AFRICAN AMERICAN: 14 mL/min — AB (ref >60)
GFR, EST NON AFRICAN AMERICAN: 12 mL/min — AB (ref >60)
Glucose, Bld: 77 mg/dL (ref 65–99)
POTASSIUM: 3.8 mmol/L (ref 3.5–5.1)
Phosphorus: 4.6 mg/dL (ref 2.5–4.6)
Sodium: 132 mmol/L — ABNORMAL LOW (ref 135–145)

## 2016-05-17 LAB — CBC
HEMATOCRIT: 20.2 % — AB (ref 39.0–52.0)
HEMOGLOBIN: 6.7 g/dL — AB (ref 13.0–17.0)
MCH: 29.6 pg (ref 26.0–34.0)
MCHC: 33.2 g/dL (ref 30.0–36.0)
MCV: 89.4 fL (ref 78.0–100.0)
Platelets: 111 10*3/uL — ABNORMAL LOW (ref 150–400)
RBC: 2.26 MIL/uL — AB (ref 4.22–5.81)
RDW: 13.8 % (ref 11.5–15.5)
WBC: 6.5 10*3/uL (ref 4.0–10.5)

## 2016-05-17 LAB — PREPARE RBC (CROSSMATCH)

## 2016-05-17 MED ORDER — SODIUM CHLORIDE 0.9 % IV SOLN: Freq: Once | INTRAVENOUS | Status: DC

## 2016-05-17 MED ORDER — CYANOCOBALAMIN 1000 MCG/ML IJ SOLN
1000.0000 ug | Freq: Once | INTRAMUSCULAR | Status: AC
  Administered 2016-05-18: 1000 ug via INTRAMUSCULAR
  Filled 2016-05-17: qty 1

## 2016-05-17 MED ORDER — ACYCLOVIR 200 MG PO CAPS
200.0000 mg | ORAL_CAPSULE | Freq: Two times a day (BID) | ORAL | Status: DC
  Administered 2016-05-17 – 2016-05-29 (×24): 200 mg via ORAL
  Filled 2016-05-17 (×24): qty 1

## 2016-05-17 MED ORDER — METHOCARBAMOL 500 MG PO TABS
250.0000 mg | ORAL_TABLET | Freq: Four times a day (QID) | ORAL | Status: DC | PRN
  Administered 2016-05-27 – 2016-05-28 (×2): 250 mg via ORAL
  Filled 2016-05-17 (×2): qty 1

## 2016-05-17 MED ORDER — POLYETHYLENE GLYCOL 3350 17 G PO PACK
17.0000 g | PACK | Freq: Two times a day (BID) | ORAL | Status: DC
  Administered 2016-05-17 – 2016-05-25 (×12): 17 g via ORAL
  Filled 2016-05-17 (×17): qty 1

## 2016-05-17 MED ORDER — SENNOSIDES-DOCUSATE SODIUM 8.6-50 MG PO TABS
2.0000 | ORAL_TABLET | Freq: Two times a day (BID) | ORAL | Status: DC
  Administered 2016-05-17 – 2016-05-25 (×13): 2 via ORAL
  Filled 2016-05-17 (×18): qty 2

## 2016-05-17 MED ORDER — BISACODYL 10 MG RE SUPP
10.0000 mg | Freq: Once | RECTAL | Status: AC
  Administered 2016-05-17: 10 mg via RECTAL
  Filled 2016-05-17: qty 1

## 2016-05-17 MED ORDER — DEXAMETHASONE 4 MG PO TABS
40.0000 mg | ORAL_TABLET | ORAL | Status: DC
  Administered 2016-05-18 – 2016-05-25 (×2): 40 mg via ORAL
  Filled 2016-05-17 (×2): qty 10

## 2016-05-17 NOTE — Progress Notes (Signed)
PT has recommended IP Rehab.  At this time, pt. Only tolerating sitting up at EOB. We  will continue to follow for improved activity tolerance before recommending IP Rehab consult. Please call if questions.  [place] Admissions Coordinator Cell [phone] Office [phone]

## 2016-05-17 NOTE — Progress Notes (Signed)
Upper Extremity Vein Map    Right Cephalic AC: [id number] Above AC: [id number]  Left Cephalic AC: [id number]   Right Basilic   Segment Diameter Depth Comment  1. Prox upper arm 5.24mm 8.44mm   2. Mid upper arm 5.63mm 7.52mm   3. Above [facility] 4.77mm 4.29mm branch  4. In [facility] 5.59mm mm   5. Below AC 3.6mm 3.61mm branch  6. Mid forearm mm mm   7. Wrist mm mm    mm mm    mm mm    mm mm     Left Basilic  Segment Diameter Depth Comment  1. Prox upper arm 4.81mm 7.81mm   2. Mid upper arm 3.55mm 3.48mm branch  3. Above AC 3.68mm 3.34mm   4. In AC 4.15mm mm   5. Below AC 3.33mm 4.88mm branch  6. Mid forearm mm mm   7. Wrist mm mm    mm mm    mm mm    mm mm

## 2016-05-17 NOTE — Progress Notes (Signed)
S: Co nausea from narcotics O:BP 119/47 mmHg  Pulse 97  Temp(Src) 99.2 F (37.3 C) (Oral)  Resp 17  Ht _0  (1.854 m)  Wt 77.2 kg (170 lb 3.1 oz)  BMI 22.46 kg/m2  SpO2 93%  Intake/Output Summary (Last 24 hours) at 05/17/16 0955 Last data filed at 05/17/16 0603  Gross per 24 hour  Intake    120 ml  Output   1000 ml  Net   -880 ml   Weight change: -0.3 kg (-10.6 oz) GUR:KYHCW and alert CVS: RRR Resp: clear Abd:+ BS NTND Ext:No edema NEURO:CNI Ox3 no asterixis Rt IJ PC   . sodium chloride   Intravenous Once  . sodium chloride   Intravenous Once  . sodium chloride   Intravenous Once  . [START ON 05/19/2016] darbepoetin (ARANESP) injection - DIALYSIS  100 mcg Intravenous Q Fri-HD  . docusate sodium  100 mg Oral BID  . feeding supplement (NEPRO CARB STEADY)  237 mL Oral BID BM  . multivitamin  1 tablet Oral QHS  . vitamin B-12  1,000 mcg Oral Daily   Ct Biopsy  05/16/2016  INDICATION: 70 year old male with a history of multiple myeloma. EXAM: CT BIOPSY MEDICATIONS: None. ANESTHESIA/SEDATION: Moderate (conscious) sedation was employed during this procedure. A total of Versed 1.0 mg and Fentanyl 50 mcg was administered intravenously. Moderate Sedation Time: 15 minutes. The patient's level of consciousness and vital signs were monitored continuously by radiology nursing throughout the procedure under my direct supervision. FLUOROSCOPY TIME:  CT COMPLICATIONS: None PROCEDURE: The procedure risks, benefits, and alternatives were explained to the patient. Questions regarding the procedure were encouraged and answered. The patient understands and consents to the procedure. Scout CT of the pelvis was performed for surgical planning purposes. The posterior pelvis was prepped with Betadinein a sterile fashion, and a sterile drape was applied covering the operative field. A sterile gown and sterile gloves were used for the procedure. Local anesthesia was provided with 1% Lidocaine. We targeted  the left posterior iliac bone for biopsy. The skin and subcutaneous tissues were infiltrated with 1% lidocaine without epinephrine. A small stab incision was made with an 11 blade scalpel, and an 11 gauge Murphy needle was advanced with CT guidance to the posterior cortex. Manual forced was used to advance the needle through the posterior cortex and the stylet was removed. A bone marrow aspirate was retrieved and passed to a cytotechnologist in the room. The Murphy needle was then advanced without the stylet for a core biopsy. The core biopsy was retrieved and also passed to a cytotechnologist. Manual pressure was used for hemostasis and a sterile dressing was placed. No complications were encountered no significant blood loss was encountered. Patient tolerated the procedure well and remained hemodynamically stable throughout. IMPRESSION: Status post CT-guided bone marrow biopsy, with tissue specimen sent to pathology for complete histopathologic analysis Signed, Dulcy Fanny. Earleen Newport, DO [facility] Electronically Signed   By: Corrie Mckusick D.O.   On: 05/16/2016 10:09   Dg Chest Port 1 View  05/17/2016  CLINICAL DATA:  Postop fever, left femur surgery EXAM: PORTABLE CHEST 1 VIEW COMPARISON:  05/10/2016 FINDINGS: Cardiomediastinal silhouette is stable. There is dual lumen right IJ catheter with tip in SVC right atrium junction. No pneumothorax. There is small left pleural effusion left basilar atelectasis or infiltrate. No pulmonary edema. IMPRESSION: No pulmonary edema. Small left pleural effusion with left basilar atelectasis or infiltrate. Electronically Signed   By: [name]  Pop M.D.   On: 05/17/2016 08:27   Mr Total Spine Mets Screening  05/16/2016  CLINICAL DATA:  Initial evaluation for metastatic disease, presumed multiple myeloma. EXAM: MRI TOTAL SPINE WITHOUT CONTRAST TECHNIQUE: Multisequence MR imaging of the spine from the cervical spine to the sacrum  was performed prior to and following IV contrast administration for evaluation of spinal metastatic disease. CONTRAST:  None. COMPARISON:  None. FINDINGS: Cervical Findings: Limited sagittal views of the cervical spine demonstrate a diffusely abnormal marrow pattern with multiple osseous lesions present, concerning for multiple myeloma. The most prevalent of these is present in the dens which demonstrates fairly diffuse abnormal STIR signal intensity (series 6, image 6). No significant extra osseous extension of tumor. No pathologic fracture. Scattered mild degenerative disc bulging throughout the cervical spine with posterior element hypertrophy. Associated diffuse canal narrowing without severe canal stenosis. No paraspinous soft tissue mass appreciated. Thoracic Findings: Limited sagittal views of the thoracic spine demonstrate diffusely abnormal marrow pattern with multiple osseous lesions present, again highly concerning for multiple myeloma. Most prominent lesion present within the T3 vertebral body and measures 19 mm. No significant extra osseous extension of tumor identified on this limited exam. No associated pathologic fracture. No paraspinous soft tissue mass appreciated. Lumbar Findings: There are 6 non rib-bearing lumbar type vertebral bodies. The lowest vertebral body is labeled L6. Vertebral body count is made from the odontoid week. Limited sagittal views of the lumbar spine demonstrate diffusely abnormal marrow pattern with innumerable osseous lesions present, highly concerning for multiple myeloma. These are present throughout the lumbar spine and sacrum. There is associated probable pathologic fracture involving the L3 vertebral body with approximately 50% of central height loss. No significant bony retropulsion. Mild compression deformity at the superior endplate of L2, likely related to chronic Schmorl's node. Large metastatic/myelomatous lesion involves the L5 vertebral body with extension into  the left posterior elements. This lesion completely involves the central and left aspect of the L5 vertebral body with destruction of the left posterior elements and extension into the paraspinous soft tissues. Lesion measures approximately 6.0 x 9.0 x 7.5 cm (series 12, image 10). Abnormal posterior convex bowing of the L5 vertebral body with moderate to severe canal stenosis (series 10, image 7). There is extra osseous extension of tumor into the adjacent left L4-5 and L5-6 neural foramina with secondary mild narrowing at L4-5 and more moderate to severe narrowing at L5-6 (series 9, image 12). A second large ovoid lesion involving the left iliac wing is partially visualized (series 12, image 13). No other definite paraspinous soft tissues mass. T2 hyperintense cyst noted within the left kidney. IMPRESSION: 1. 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 Careful correlation to the vertebral body count on this exam is recommended prior to any potential future intervention. 2. Diffusely abnormal marrow signal intensity with innumerable osseous lesions present throughout the spine, concerning for multiple myeloma given the provided history. 3. Large 6.0 x 9.0 x 7.5 cm lesion with associated extraosseous extension of tumor into the left paraspinous soft tissues at L5. Abnormal posterior convex bowing of the L5 vertebral body with moderate to severe canal stenosis. Neural foraminal encroachment on the left at L4-5 and L5-6 as well. No other significant canal or neural foraminal encroachment within the lumbar spine. 4. No significant extraosseous tumor or or spinal canal encroachment within the cervical and thoracic spine. 5. Compression deformity of L3 with up to 50% height loss, which may be pathologic in nature. No bony  retropulsion. 6. Additional large lesion with extraosseous extension of tumor involving the left iliac wing, incompletely visualized on this exam.  Electronically Signed   By: Jeannine Boga M.D.   On: 05/16/2016 00:51   BMET    Component Value Date/Time   NA 132* 05/17/2016 0648   K 3.8 05/17/2016 0648   CL 93* 05/17/2016 0648   CO2 28 05/17/2016 0648   GLUCOSE 77 05/17/2016 0648   BUN 14 05/17/2016 0648   CREATININE 4.49* 05/17/2016 0648   CALCIUM 7.8* 05/17/2016 0648   GFRNONAA 12* 05/17/2016 0648   GFRAA 14* 05/17/2016 0648   CBC    Component Value Date/Time   WBC 6.5 05/17/2016 0648   RBC 2.26* 05/17/2016 0648   HGB 6.7* 05/17/2016 0648   HCT 20.2* 05/17/2016 0648   HCT 22.8* 05/10/2016 1156   PLT 111* 05/17/2016 0648   MCV 89.4 05/17/2016 0648   MCH 29.6 05/17/2016 0648   MCHC 33.2 05/17/2016 0648   RDW 13.8 05/17/2016 0648   LYMPHSABS 1.1 05/09/2016 2144   MONOABS 0.6 05/09/2016 2144   EOSABS 0.1 05/09/2016 2144   BASOSABS 0.0 05/09/2016 2144     Assessment: 1. 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 MM 3. Comp fx SP ORIF 4. L5 mass  Rad onc to see per NS 5. anemia Plan:  1. HD tomorrow   Carisa Backhaus T

## 2016-05-17 NOTE — Evaluation (Signed)
Physical Therapy Evaluation Patient Details Name: Thomas [name] MRN: [id number] DOB: 1946/06/15 Today's Date: 05/17/2016   History of Present Illness  70 year old male with no significant medical history (had not seen a physician in many years) presented with about 1 month of nausea with daily vomiting. Also reports poor by mouth intake and subjective weight loss. Denied any diarrhea, hematemesis or bleeding per rectum. Patient went to the urgent care and was found to have a hemoglobin of 9.8 and sent to the ED. Lab work showing hyperproteinemia, abnormal lucency of lumbar vertebrae and multiple radiolucencies in the skull x-ray indicated towards multiple myeloma; now s/p IM fixation of Impending pathologic fracture, left femur with bone biopsy; WBAT; further imaging showing L3 compression fx and L5 lesion; Neurosurgery consulted, not surgical candidate at this time; they did not order a brace  Clinical Impression   Patient is admitted with above diagnosis, s/p above surgery resulting in functional limitations due to the deficits listed below (see PT Problem List).   Thomas [name] requires 2 person assist at this time, and on this eval he sat up on the edge of the bed; I assured Thomas [name] that more often than not, the first time getting up is the hardest time; in discussion after moving, Thomas [name] tells me he wants to get up; We discussed with pt, wife, and daughter the need for post-acute care rehabilitation to maximize independence and safety with mobility with the goal of making getting home better, easier, and safer; pt and family agree, and I believe it is worth considering CIR, given independence PTA, devoted family assist; of course, we will need to discern if he will be able to work up to tolerating 3 hours of therapy a day; will place Rehab Screen;   Patient will benefit from skilled PT to increase their independence and safety with mobility to allow discharge to the venue listed below.        Follow Up Recommendations Other (comment) (Post-acute rehabilitation)    Equipment Recommendations  Rolling walker with 5" wheels;3in1 (PT);Wheelchair (measurements PT);Wheelchair cushion (measurements PT)    Recommendations for Other Services OT consult     Precautions / Restrictions Precautions Precautions: Fall;Other (comment) (Consider Back prec for comfort) Restrictions LLE Weight Bearing: Weight bearing as tolerated      Mobility  Bed Mobility Overal bed mobility: +2 for physical assistance;Needs Assistance Bed Mobility: Supine to Sit     Supine to sit: +2 for physical assistance;Max assist     General bed mobility comments: Given significant pain and guarding with any movement of L hip, we opted for HOB moderately raised and turn to get to EOB with trunk completely supported; Thomas [name] demonstrated good half-bridge using LLE to move trunk and hips to EOB with mod assist; +2 max assist to supoort trunk and elevate to sitting; initially with antalgic L lean; able to get to upright sitting briefly, pt sick upon sitting up , mostly dry heaves; +2 assist to lay back down to supine  Transfers                 General transfer comment: very painful and pt declined; after discussion, it sounds like he will be more willing to try standing next session  Ambulation/Gait                Stairs            Wheelchair Mobility    Modified Rankin (Stroke Patients Only)  Balance Overall balance assessment: Needs assistance Sitting-balance support: Bilateral upper extremity supported Sitting balance-Leahy Scale: Poor Sitting balance - Comments: initially with antalgic R lean; able to correct to upright with time and cues Postural control: Right lateral lean                                   Pertinent Vitals/Pain Pain Assessment: Faces Faces Pain Scale: Hurts whole lot Pain Location: Grimace with any and all movement of LLE; Grimace  also with back pain while sitting EOB Pain Descriptors / Indicators: Aching;Grimacing;Guarding;Spasm Pain Intervention(s): Limited activity within patient's tolerance;Monitored during session;Premedicated before session;Repositioned;Other (comment) (informed Dr. Sheran Fava of spasm limiting LLE movement)    Home Living Family/patient expects to be discharged to:: Other (Comment) (Post-acute Rehab)                 Additional Comments: Pt, wife, and daughter expressed understanding that he will need rehabilitation once medically stable    Prior Function Level of Independence: Independent               Hand Dominance   Dominant Hand: Right    Extremity/Trunk Assessment   Upper Extremity Assessment: Generalized weakness (but able to reach up and use trapeze)           Lower Extremity Assessment: RLE deficits/detail RLE Deficits / Details: Grossly decr AROM and strength throughout, significantly limited by pain; quad activation and hamstring activation present, but weak       Communication   Communication: No difficulties  Cognition Arousal/Alertness: Awake/alert Behavior During Therapy: WFL for tasks assessed/performed;Anxious (wit anticipation of pain) Overall Cognitive Status: Within Functional Limits for tasks assessed                      General Comments      Exercises        Assessment/Plan    PT Assessment Patient needs continued PT services  PT Diagnosis Acute pain;Generalized weakness;Difficulty walking   PT Problem List Decreased strength;Decreased activity tolerance;Decreased balance;Decreased mobility;Decreased coordination;Decreased cognition;Decreased knowledge of use of DME;Decreased range of motion;Decreased knowledge of precautions;Pain  PT Treatment Interventions DME instruction;Gait training;Functional mobility training;Therapeutic activities;Therapeutic exercise;Balance training;Patient/family education   PT Goals (Current goals can  be found in the Care Plan section) Acute Rehab PT Goals Patient Stated Goal: Tells me he wants to get up PT Goal Formulation: With patient Time For Goal Achievement: 05/31/16 Potential to Achieve Goals: Good    Frequency Min 3X/week   Barriers to discharge        Co-evaluation               End of Session Equipment Utilized During Treatment: Other (comment) (bed pad) Activity Tolerance: Patient limited by pain Patient left: in bed;with call bell/phone within reach;with family/visitor present;Other (comment) (Dr. Sheran Fava in room) Nurse Communication: Mobility status         Time: 1017-1100 PT Time Calculation (min) (ACUTE ONLY): 43 min   Charges:   PT Evaluation $PT Eval Moderate Complexity: 1 Procedure PT Treatments $Therapeutic Exercise: 8-22 mins $Therapeutic Activity: 8-22 mins   PT G Codes:        Thomas [name] 05/17/2016, 12:18 PM  Roney Marion, PT  Acute Rehabilitation Services Pager [phone] Office [phone]

## 2016-05-17 NOTE — NC FL2 (Signed)
[place] LEVEL OF CARE SCREENING TOOL     IDENTIFICATION  Patient Name: Thomas [name] Birthdate: 28-Jul-1946 Sex: male Admission Date (Current Location): 05/09/2016  [facility] and [place] Number:  Herbalist and Address:  [facility] [place] [address] [place], [place] [address]      Provider Number: [id number]  Attending Physician Name and Address:  Janece Canterbury, MD  Relative Name and Phone Number:  Ebenezer Mccaskey, spouse. Phone [phone] (mobile)    Current Level of Care: Hospital Recommended Level of Care: [place] Prior Approval Number:    Date Approved/Denied:   PASRR Number: [id number] (Eff. 05/17/16)  Discharge Plan: SNF    Current Diagnoses: Patient Active Problem List   Diagnosis Date Noted  . Multiple myeloma ([place]) 05/14/2016  . Acute renal failure ([place])   . Myeloma ([place])   . 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 Anemia due to bone marrow failure ([place])   . Kidney failure 05/10/2016  . 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 Occult blood positive stool 05/10/2016  . Malnutrition of moderate degree 05/10/2016    Orientation RESPIRATION BLADDER Height & Weight     Self, Time, Situation, Place  O2 (2 Liters Oxygen) Continent Weight: 170 lb 3.1 oz (77.2 kg) Height:  6' 1"  (185.4 cm)  BEHAVIORAL SYMPTOMS/MOOD NEUROLOGICAL BOWEL NUTRITION STATUS      Continent Diet (Regular diet)  AMBULATORY STATUS COMMUNICATION OF NEEDS Skin   Total Care (Patient unable to ambulate with PT on 6/7 due to L3 compression fx. Patient also unable to transfer due to pain.) Verbally Other (Comment) (Incision left thigh and lower back)                       Personal Care Assistance Level of Assistance  Bathing, Feeding, Dressing Bathing Assistance: Limited assistance Feeding assistance: Independent Dressing Assistance: Limited assistance     Functional Limitations Info   Sight, Hearing, Speech Sight Info: Adequate Hearing Info: Adequate Speech Info: Adequate    SPECIAL CARE FACTORS FREQUENCY  PT (By licensed PT)     PT Frequency: Evaluation 6/7 and a minimum of 3X per week recommended              Contractures Contractures Info: Not present    Additional Factors Info  Code Status, Allergies Code Status Info: Full Code Allergies Info: Penicillins           Current Medications (05/17/2016):  This is the current hospital active medication list Current Facility-Administered Medications  Medication Dose Route Frequency Provider Last Rate Last Dose  . 0.9 %  sodium chloride infusion   Intravenous Once Nishant Dhungel, MD      . 0.9 %  sodium chloride infusion   Intravenous Continuous Adam Hodierne, MD      . 0.9 %  sodium chloride infusion   Intravenous Continuous Jaclyn M Bissell, PA-C      . 0.9 %  sodium chloride infusion   Intravenous Once Rod Can, MD      . 0.9 %  sodium chloride infusion   Intravenous Once Janece Canterbury, MD      . acetaminophen (TYLENOL) tablet 650 mg  650 mg Oral Q6H PRN Cecilie Kicks, PA-C       Or  . acetaminophen (TYLENOL) suppository 650 mg  650 mg Rectal Q6H PRN Cecilie Kicks, PA-C      .  alum & mag hydroxide-simeth (MAALOX/MYLANTA) 200-200-20 MG/5ML suspension 30 mL  30 mL Oral Q4H PRN Cecilie Kicks, PA-C      . bisacodyl (DULCOLAX) EC tablet 5 mg  5 mg Oral Daily PRN Cecilie Kicks, PA-C      . [START ON 05/19/2016] Darbepoetin Alfa (ARANESP) injection 100 mcg  100 mcg Intravenous Q Fri-HD Fleet Contras, MD      . docusate sodium (COLACE) capsule 100 mg  100 mg Oral BID Cecilie Kicks, PA-C   100 mg at 05/17/16 1015  . feeding supplement (NEPRO CARB STEADY) liquid 237 mL  237 mL Oral BID BM Etta Quill, DO   237 mL at 05/15/16 1123  . HYDROcodone-acetaminophen (NORCO/VICODIN) 5-325 MG per tablet 1-2 tablet  1-2 tablet Oral Q6H PRN Cecilie Kicks, PA-C   2 tablet at 05/17/16 1021  .  menthol-cetylpyridinium (CEPACOL) lozenge 3 mg  1 lozenge Oral PRN Cecilie Kicks, PA-C       Or  . phenol (CHLORASEPTIC) mouth spray 1 spray  1 spray Mouth/Throat PRN Cecilie Kicks, PA-C      . metoCLOPramide (REGLAN) tablet 5-10 mg  5-10 mg Oral Q8H PRN Cecilie Kicks, PA-C       Or  . metoCLOPramide (REGLAN) injection 5-10 mg  5-10 mg Intravenous Q8H PRN Cecilie Kicks, PA-C      . multivitamin (RENA-VIT) tablet 1 tablet  1 tablet Oral QHS Mauricia Area, MD   1 tablet at 05/15/16 2227  . ondansetron (ZOFRAN) tablet 4 mg  4 mg Oral Q6H PRN Cecilie Kicks, PA-C       Or  . ondansetron [facility]) injection 4 mg  4 mg Intravenous Q6H PRN Cecilie Kicks, PA-C   4 mg at 05/16/16 2031  . polyethylene glycol (MIRALAX / GLYCOLAX) packet 17 g  17 g Oral Daily PRN Cecilie Kicks, PA-C      . vitamin B-12 (CYANOCOBALAMIN) tablet 1,000 mcg  1,000 mcg Oral Daily Nishant Dhungel, MD   1,000 mcg at 05/17/16 1015     Discharge Medications: Please see discharge summary for a list of discharge medications.  Relevant Imaging Results:  Relevant Lab Results:   Additional Information [id number]. New dialysis patient and process underway to arrange treatment at an HD Center in [place].  Sable Feil, LCSW

## 2016-05-17 NOTE — Progress Notes (Signed)
Addendum to my note  The patient is at high risk for tumor lysis. I will order daily labs along with uric acid for the next couple days. If uric acid level is high, I will give him rasburicase.

## 2016-05-17 NOTE — Progress Notes (Signed)
05/17/2016 9:59 AM Hemodialysis Outpatient Note; I have initiated the "CLIP" process, per Mrs Wah's request we are looking for a schedule at the [facility] dialysis center. Thank you. Gordy Savers

## 2016-05-17 NOTE — Progress Notes (Addendum)
 PROGRESS NOTE  Thomas [name]  MRN:[id number] DOB: 08/10/1946 DOA: 05/09/2016 PCP: No PCP Per Patient  Brief Narrative:   70-year-old male with no significant medical history (had not seen a physician in many years) presented with about 1 month of nausea with daily vomiting. Also reports poor by mouth intake and subjective weight loss. Denied any diarrhea, hematemesis or bleeding per rectum. Patient went to the urgent care and was found to have a hemoglobin of 9.8 and sent to the ED. In the ED blood work showed creatinine >10 and BUN of 40. He was taking high dose of NSAIDs about 3 months back for severe low back pain, prescribed by his chiropractor. Reports that he told them for less than 2 weeks.  He was also found to have severe hyperkalemia and given insulin with dextrose. Admitted to hospitalist service for further management. Lab work showed hyperproteinemia, abnormal lucency of lumbar vertebrae and multiple radiolucencies in the skull x-ray indicated towards multiple myeloma.  Oncology was consulted.  He was also found to have pathological fracture of left femur and underwent IM nailing.  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 -SPEP, light chains and IFE consistent with IgG kappa multiple myeloma. -Renal biopsy results Still pending.  - Bone biopsy from left hip surgery:  Sheets of atypical plasma cells - Started hemodialysis 6/2 -  CLIP process started -  Vein mapping complete  Multiple myeloma with diffuse skeletal  lesions -Bone survey showing metastatic bone lesions with largest lesion involving left iliac wing. Also lesion involving midshaft of left femur with pathological fracture. L3 and L5 compression fracture. - MRI of the spine shows large lumbar lesion associated with extraosseous extension of tumor into the left paraspinous soft tissue at L5 with moderate to severe canal stenosis. Evaluated by neurosurgery and given no neurological deficit recommends no surgical intervention. Recommend radiation therapy.  - plan for outpatient palliative radiation by Dr. Moody - Bone marrow biopsy done on 6/6.  - Dr. Gorsuch following -  Starting dexamethasone and velcade on 6/8.  RN from [facility] to come to [facility] to provide Velcade   Low grade fever, asymptomatic, may be post-operative atelectasis.  Clinically well appearing.  No sepsis. -  CXR:  Possible left lower lobe infiltrate vs. atelectasis -  IS and OOB to chair as soon as possible -  If he becomes sicker, blood cultures and pneumonia order set for aspiration pneumonia -  MRSA PCR negative on 6/4 so would probably not start vancomycin  -  UA pending (nearly anuric)  Pathological fracture of left femur IM nailing with open biopsy of the left femur with intraoperative frozen section done on 6/6.. Results pending. continue pain control.  Anemia due to ARF, myeloma, and vitamin b12 deficiency -Has received 7 units of PRBC - transfuse 2 more units today  Progressive thrombocytopenia. Platelet function assay is high. Avoid heparin products.  Received 2 u platelets.  Vitamin B12 deficiency (106). - anti-IF - anti-parietal cell -  IM supplementation for   now  Hyperkalemia, Improved after IV insulin and dextrose. Now on dialysis.  Malnutrition of moderate degree -  Renal diet with supplements  Constipation  -  Daily miralax -  Bisacodyl suppository -  Continue senna  DVT prophylaxis:  SCDs Code Status:  full Family Communication:  Patient,  wife Disposition Plan:  Pending arrangements for outpatient HD and SNF placement.  SW consult placed.  CLIP started.   Consultants:  Nephrology (Dr. Deterding) IR Oncology (Dr. Gorsuch) Orthopedics (Dr Swintek) Neurosurgery Radiation onc ( Dr Manning)  Procedures:  Left renal biopsy Bone marrow biopsy on 6/6 IM nailding of the left femur fracture  Antimicrobials:   none    Subjective: Having some pain in the left leg, but overall feeling well.  Denies dyspnea.  Eating and drinking okay.  Nausea improved.    Objective: Filed Vitals:   05/17/16 1245 05/17/16 1625 05/17/16 1820 05/17/16 1853  BP: 116/54 105/49 115/56 117/57  Pulse: 93 89 83 84  Temp: 99.2 F (37.3 C) 98.3 F (36.8 C) 98.5 F (36.9 C) 98.3 F (36.8 C)  TempSrc: Oral Oral Oral Oral  Resp: 17 16 18   Height:      Weight:      SpO2: 93% 97% 99% 99%    Intake/Output Summary (Last 24 hours) at 05/17/16 1935 Last data filed at 05/17/16 1844  Gross per 24 hour  Intake    240 ml  Output      0 ml  Net    240 ml   Filed Weights   05/15/16 1008 05/15/16 2155 05/16/16 1544  Weight: 77.5 kg (170 lb 13.7 oz) 75.206 kg (165 lb 12.8 oz) 77.2 kg (170 lb 3.1 oz)    Examination:  General exam:  Thin adult male.  No acute distress.  HEENT:  NCAT, MMM Respiratory system:  Diminished at the bilateral bases Cardiovascular system: Regular rate and rhythm, normal S1/S2.  2/6 systolic murmur, no gallops or clicks.  Warm extremities Gastrointestinal system: Normal active bowel sounds, soft, nondistended, nontender. MSK:  Normal tone and bulk, 1+ left lower extremity edema, did not remove ACE bandage today to examine thigh Neuro:  Grossly intact    Data Reviewed: I have personally reviewed following labs and imaging studies  CBC:  Recent Labs Lab 05/14/16 0517 05/14/16 1812 05/15/16 0743 05/16/16 0949 05/17/16 0648  WBC 4.4 6.8 5.9 7.4 6.5  HGB 9.7* 7.6* 6.5* 7.3* 6.7*  HCT 28.5* 22.1* 18.2* 21.8*  20.2*  MCV 85.3 87.4 84.7 89.7 89.4  PLT 56* 99* 95* 93* 111*   Basic Metabolic Panel:  Recent Labs Lab 05/12/16 0913 05/13/16 1223  05/14/16 0517 05/14/16 1812 05/15/16 0743 05/16/16 0949 05/17/16 0648  NA 129* 128*  < > 129* 130* 130* 134* 132*  K 5.3* 4.4  < > 4.4 5.2* 5.3* 4.6 3.8  CL 100* 97*  < > 97* 101 98* 100* 93*  CO2 17* 23  < > 25 22 22 26 28  GLUCOSE 111* 112*  < > 79 136* 110* 92 77  BUN 53* 36*  < > 27* 31* 39* 25* 14  CREATININE 13.22* 9.93*  < > 8.31* 8.32* 9.26* 6.84* 4.49*  CALCIUM 8.2* 8.1*  < > 8.2* 7.6* 7.7* 7.9* 7.8*  PHOS 6.0* 5.1*  --   --   --   --   --  4.6  < > = values in this interval not displayed. GFR: Estimated Creatinine Clearance: 17.2 mL/min (by C-G formula based on   Cr of 4.49). Liver Function Tests:  Recent Labs Lab 05/12/16 0913 05/13/16 1223 05/17/16 0648  ALBUMIN 2.1* 1.9* 1.9*   No results for input(s): LIPASE, AMYLASE in the last 168 hours. No results for input(s): AMMONIA in the last 168 hours. Coagulation Profile:  Recent Labs Lab 05/11/16 0500  INR 1.46   Cardiac Enzymes: No results for input(s): CKTOTAL, CKMB, CKMBINDEX, TROPONINI in the last 168 hours. BNP (last 3 results) No results for input(s): PROBNP in the last 8760 hours. HbA1C: No results for input(s): HGBA1C in the last 72 hours. CBG: No results for input(s): GLUCAP in the last 168 hours. Lipid Profile: No results for input(s): CHOL, HDL, LDLCALC, TRIG, CHOLHDL, LDLDIRECT in the last 72 hours. Thyroid Function Tests: No results for input(s): TSH, T4TOTAL, FREET4, T3FREE, THYROIDAB in the last 72 hours. Anemia Panel: No results for input(s): VITAMINB12, FOLATE, FERRITIN, TIBC, IRON, RETICCTPCT in the last 72 hours. Urine analysis:    Component Value Date/Time   COLORURINE YELLOW 05/10/2016 0812   APPEARANCEUR CLOUDY* 05/10/2016 0812   LABSPEC 1.017 05/10/2016 0812   PHURINE 6.0 05/10/2016 0812   GLUCOSEU 100* 05/10/2016 0812   HGBUR SMALL*  05/10/2016 0812   BILIRUBINUR NEGATIVE 05/10/2016 0812   KETONESUR NEGATIVE 05/10/2016 0812   PROTEINUR 100* 05/10/2016 0812   NITRITE NEGATIVE 05/10/2016 0812   LEUKOCYTESUR MODERATE* 05/10/2016 0812   Sepsis Labs: _0 (procalcitonin:4,lacticidven:4)  ) Recent Results (from the past 240 hour(s))  Surgical pcr screen     Status: None   Collection Time: 05/14/16  1:00 AM  Result Value Ref Range Status   MRSA, PCR NEGATIVE NEGATIVE Final   Staphylococcus aureus NEGATIVE NEGATIVE Final    Comment:        The Xpert SA Assay (FDA approved for NASAL specimens in patients over 45 years of age), is one component of a comprehensive surveillance program.  Test performance has been validated by [facility] for patients greater than or equal to 84 year old. It is not intended to diagnose infection nor to guide or monitor treatment.   Culture, blood (routine x 2)     Status: None (Preliminary result)   Collection Time: 05/16/16  8:05 PM  Result Value Ref Range Status   Specimen Description BLOOD HEMODIALYSIS CATHETER  Final   Special Requests BOTTLES DRAWN AEROBIC AND ANAEROBIC 10CC  Final   Culture NO GROWTH < 24 HOURS  Final   Report Status PENDING  Incomplete  Culture, blood (routine x 2)     Status: None (Preliminary result)   Collection Time: 05/16/16  8:15 PM  Result Value Ref Range Status   Specimen Description BLOOD HEMODIALYSIS CATHETER  Final   Special Requests BOTTLES DRAWN AEROBIC AND ANAEROBIC 10CC  Final   Culture NO GROWTH < 24 HOURS  Final   Report Status PENDING  Incomplete      Radiology Studies: Ct Biopsy  05/16/2016  INDICATION: 70 year old male with a history of multiple myeloma. EXAM: CT BIOPSY MEDICATIONS: None. ANESTHESIA/SEDATION: Moderate (conscious) sedation was employed during this procedure. A total of Versed 1.0 mg and Fentanyl 50 mcg was administered intravenously. Moderate Sedation Time: 15 minutes. The patient's level of consciousness and  vital signs were monitored continuously by radiology nursing throughout the procedure under my direct supervision. FLUOROSCOPY TIME:  CT COMPLICATIONS: None PROCEDURE: The procedure risks, benefits, and alternatives were explained to the patient. Questions regarding the procedure were encouraged and answered. The patient understands and consents to the procedure. Scout CT of the pelvis  was performed for surgical planning purposes. The posterior pelvis was prepped with Betadinein a sterile fashion, and a sterile drape was applied covering the operative field. A sterile gown and sterile gloves were used for the procedure. Local anesthesia was provided with 1% Lidocaine. We targeted the left posterior iliac bone for biopsy. The skin and subcutaneous tissues were infiltrated with 1% lidocaine without epinephrine. A small stab incision was made with an 11 blade scalpel, and an 11 gauge Murphy needle was advanced with CT guidance to the posterior cortex. Manual forced was used to advance the needle through the posterior cortex and the stylet was removed. A bone marrow aspirate was retrieved and passed to a cytotechnologist in the room. The Murphy needle was then advanced without the stylet for a core biopsy. The core biopsy was retrieved and also passed to a cytotechnologist. Manual pressure was used for hemostasis and a sterile dressing was placed. No complications were encountered no significant blood loss was encountered. Patient tolerated the procedure well and remained hemodynamically stable throughout. IMPRESSION: Status post CT-guided bone marrow biopsy, with tissue specimen sent to pathology for complete histopathologic analysis Signed, Dulcy Fanny. Earleen Newport, DO [facility] Electronically Signed   By: Corrie Mckusick D.O.   On: 05/16/2016 10:09   Dg Chest Port 1 View  05/17/2016  CLINICAL DATA:  Postop fever, left femur surgery EXAM: PORTABLE CHEST 1 VIEW  COMPARISON:  05/10/2016 FINDINGS: Cardiomediastinal silhouette is stable. There is dual lumen right IJ catheter with tip in SVC right atrium junction. No pneumothorax. There is small left pleural effusion left basilar atelectasis or infiltrate. No pulmonary edema. IMPRESSION: No pulmonary edema. Small left pleural effusion with left basilar atelectasis or infiltrate. Electronically Signed   By: Lahoma Crocker M.D.   On: 05/17/2016 08:27   Mr Total Spine Mets Screening  05/16/2016  CLINICAL DATA:  Initial evaluation for metastatic disease, presumed multiple myeloma. EXAM: MRI TOTAL SPINE WITHOUT CONTRAST TECHNIQUE: Multisequence MR imaging of the spine from the cervical spine to the sacrum was performed prior to and following IV contrast administration for evaluation of spinal metastatic disease. CONTRAST:  None. COMPARISON:  None. FINDINGS: Cervical Findings: Limited sagittal views of the cervical spine demonstrate a diffusely abnormal marrow pattern with multiple osseous lesions present, concerning for multiple myeloma. The most prevalent of these is present in the dens which demonstrates fairly diffuse abnormal STIR signal intensity (series 6, image 6). No significant extra osseous extension of tumor. No pathologic fracture. Scattered mild degenerative disc bulging throughout the cervical spine with posterior element hypertrophy. Associated diffuse canal narrowing without severe canal stenosis. No paraspinous soft tissue mass appreciated. Thoracic Findings: Limited sagittal views of the thoracic spine demonstrate diffusely abnormal marrow pattern with multiple osseous lesions present, again highly concerning for multiple myeloma. Most prominent lesion present within the T3 vertebral body and measures 19 mm. No significant extra osseous extension of tumor identified on this limited exam. No associated pathologic fracture. No paraspinous soft tissue mass appreciated. Lumbar Findings: There are 6 non rib-bearing lumbar  type vertebral bodies. The lowest vertebral body is labeled L6. Vertebral body count is made from the odontoid week. Limited sagittal views of the lumbar spine demonstrate diffusely abnormal marrow pattern with innumerable osseous lesions present, highly concerning for multiple myeloma. These are present throughout the lumbar spine and sacrum. There is associated probable pathologic fracture involving the L3 vertebral body with approximately 50% of central height loss. No significant bony retropulsion. Mild compression  deformity at the superior endplate of L2, likely related to chronic Schmorl's node. Large metastatic/myelomatous lesion involves the L5 vertebral body with extension into the left posterior elements. This lesion completely involves the central and left aspect of the L5 vertebral body with destruction of the left posterior elements and extension into the paraspinous soft tissues. Lesion measures approximately 6.0 x 9.0 x 7.5 cm (series 12, image 10). Abnormal posterior convex bowing of the L5 vertebral body with moderate to severe canal stenosis (series 10, image 7). There is extra osseous extension of tumor into the adjacent left L4-5 and L5-6 neural foramina with secondary mild narrowing at L4-5 and more moderate to severe narrowing at L5-6 (series 9, image 12). A second large ovoid lesion involving the left iliac wing is partially visualized (series 12, image 13). No other definite paraspinous soft tissues mass. T2 hyperintense cyst noted within the left kidney. IMPRESSION: 1. 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 Careful correlation to the vertebral body count on this exam is recommended prior to any potential future intervention. 2. Diffusely abnormal marrow signal intensity with innumerable osseous lesions present throughout the spine, concerning for multiple myeloma given the provided history. 3. Large 6.0 x 9.0 x 7.5 cm lesion with associated  extraosseous extension of tumor into the left paraspinous soft tissues at L5. Abnormal posterior convex bowing of the L5 vertebral body with moderate to severe canal stenosis. Neural foraminal encroachment on the left at L4-5 and L5-6 as well. No other significant canal or neural foraminal encroachment within the lumbar spine. 4. No significant extraosseous tumor or or spinal canal encroachment within the cervical and thoracic spine. 5. Compression deformity of L3 with up to 50% height loss, which may be pathologic in nature. No bony retropulsion. 6. Additional large lesion with extraosseous extension of tumor involving the left iliac wing, incompletely visualized on this exam. Electronically Signed   By: Jeannine Boga M.D.   On: 05/16/2016 00:51     Scheduled Meds: . sodium chloride   Intravenous Once  . sodium chloride   Intravenous Once  . sodium chloride   Intravenous Once  . acyclovir  200 mg Oral BID  . [START ON 05/18/2016] cyanocobalamin  1,000 mcg Intramuscular Once  . [START ON 05/19/2016] darbepoetin (ARANESP) injection - DIALYSIS  100 mcg Intravenous Q Fri-HD  . [START ON 05/18/2016] dexamethasone  40 mg Oral Weekly  . docusate sodium  100 mg Oral BID  . feeding supplement (NEPRO CARB STEADY)  237 mL Oral BID BM  . multivitamin  1 tablet Oral QHS  . senna-docusate  2 tablet Oral BID  . vitamin B-12  1,000 mcg Oral Daily   Continuous Infusions: . sodium chloride    . sodium chloride       LOS: 7 days    Time spent: 30 min    Janece Canterbury, MD [facility] Pager [phone]  If 7PM-7AM, please contact night-coverage [email or web address] Password [facility] 05/17/2016, 7:35 PM

## 2016-05-17 NOTE — Progress Notes (Signed)
   Subjective:  Patient reports pain as mild to moderate.  LLE is feeling better  Objective:   VITALS:   Filed Vitals:   05/17/16 0427 05/17/16 0732 05/17/16 1216 05/17/16 1245  BP: 126/58 119/47 112/49 116/54  Pulse: 103 97 92 93  Temp: 98.9 F (37.2 C) 99.2 F (37.3 C) 99.2 F (37.3 C) 99.2 F (37.3 C)  TempSrc: Oral Oral Oral Oral  Resp: _0 Height:      Weight:      SpO2: 99% 93% 94% 93%    ABD soft Sensation intact distally Intact pulses distally Dorsiflexion/Plantar flexion intact Incision: dressing C/D/I Compartment soft hip spica wrap intact   Lab Results  Component Value Date   WBC 6.5 05/17/2016   HGB 6.7* 05/17/2016   HCT 20.2* 05/17/2016   MCV 89.4 05/17/2016   PLT 111* 05/17/2016   BMET    Component Value Date/Time   NA 132* 05/17/2016 0648   K 3.8 05/17/2016 0648   CL 93* 05/17/2016 0648   CO2 28 05/17/2016 0648   GLUCOSE 77 05/17/2016 0648   BUN 14 05/17/2016 0648   CREATININE 4.49* 05/17/2016 0648   CALCIUM 7.8* 05/17/2016 0648   GFRNONAA 12* 05/17/2016 0648   GFRAA 14* 05/17/2016 0648     Assessment/Plan: 3 Days Post-Op   Principal Problem:   Kidney failure Active Problems:   Renal tubular acidosis, type 4   Occult blood positive stool   Malnutrition of moderate degree   Acute renal failure (HCC)   Myeloma (HCC)   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

## 2016-05-17 NOTE — Progress Notes (Signed)
Thomas [name]   DOB:October 23, 1946   XB#:[id number]    Subjective: I saw the patient in his room. 6 other family members are present. The patient is receiving blood transfusion. He appears comfortable with no complaints of pain. He is constipated with no bowel movement for week. Denies nausea  Objective:  Filed Vitals:   05/17/16 1216 05/17/16 1245  BP: 112/49 116/54  Pulse: 92 93  Temp: 99.2 F (37.3 C) 99.2 F (37.3 C)  Resp: 16 17     Intake/Output Summary (Last 24 hours) at 05/17/16 1436 Last data filed at 05/17/16 1425  Gross per 24 hour  Intake    240 ml  Output   1000 ml  Net   -760 ml    GENERAL:alert, no distress and comfortable SKIN: skin color, texture, turgor are normal, no rashes or significant lesions EYES: normal, Conjunctiva are pale and non-injected, sclera clear Musculoskeletal:no cyanosis of digits and no clubbing  NEURO: alert & oriented x 3 with fluent speech, no focal motor/sensory deficits   Labs:  Lab Results  Component Value Date   WBC 6.5 05/17/2016   HGB 6.7* 05/17/2016   HCT 20.2* 05/17/2016   MCV 89.4 05/17/2016   PLT 111* 05/17/2016   NEUTROABS 4.4 05/09/2016    Lab Results  Component Value Date   NA 132* 05/17/2016   K 3.8 05/17/2016   CL 93* 05/17/2016   CO2 28 05/17/2016    Studies:  Ct Biopsy  05/16/2016  INDICATION: 70 year old male with a history of multiple myeloma. EXAM: CT BIOPSY MEDICATIONS: None. ANESTHESIA/SEDATION: Moderate (conscious) sedation was employed during this procedure. A total of Versed 1.0 mg and Fentanyl 50 mcg was administered intravenously. Moderate Sedation Time: 15 minutes. The patient's level of consciousness and vital signs were monitored continuously by radiology nursing throughout the procedure under my direct supervision. FLUOROSCOPY TIME:  CT COMPLICATIONS: None PROCEDURE: The procedure risks, benefits, and alternatives were explained to the patient. Questions regarding the procedure were encouraged and  answered. The patient understands and consents to the procedure. Scout CT of the pelvis was performed for surgical planning purposes. The posterior pelvis was prepped with Betadinein a sterile fashion, and a sterile drape was applied covering the operative field. A sterile gown and sterile gloves were used for the procedure. Local anesthesia was provided with 1% Lidocaine. We targeted the left posterior iliac bone for biopsy. The skin and subcutaneous tissues were infiltrated with 1% lidocaine without epinephrine. A small stab incision was made with an 11 blade scalpel, and an 11 gauge Murphy needle was advanced with CT guidance to the posterior cortex. Manual forced was used to advance the needle through the posterior cortex and the stylet was removed. A bone marrow aspirate was retrieved and passed to a cytotechnologist in the room. The Murphy needle was then advanced without the stylet for a core biopsy. The core biopsy was retrieved and also passed to a cytotechnologist. Manual pressure was used for hemostasis and a sterile dressing was placed. No complications were encountered no significant blood loss was encountered. Patient tolerated the procedure well and remained hemodynamically stable throughout. IMPRESSION: Status post CT-guided bone marrow biopsy, with tissue specimen sent to pathology for complete histopathologic analysis Signed, Dulcy Fanny. Earleen Newport, DO [facility] Electronically Signed   By: Corrie Mckusick D.O.   On: 05/16/2016 10:09   Dg Chest Port 1 View  05/17/2016  CLINICAL DATA:  Postop fever, left femur surgery EXAM: PORTABLE CHEST 1 VIEW  COMPARISON:  05/10/2016 FINDINGS: Cardiomediastinal silhouette is stable. There is dual lumen right IJ catheter with tip in SVC right atrium junction. No pneumothorax. There is small left pleural effusion left basilar atelectasis or infiltrate. No pulmonary edema. IMPRESSION: No pulmonary edema. Small left  pleural effusion with left basilar atelectasis or infiltrate. Electronically Signed   By: Lahoma Crocker M.D.   On: 05/17/2016 08:27   Mr Total Spine Mets Screening  05/16/2016  CLINICAL DATA:  Initial evaluation for metastatic disease, presumed multiple myeloma. EXAM: MRI TOTAL SPINE WITHOUT CONTRAST TECHNIQUE: Multisequence MR imaging of the spine from the cervical spine to the sacrum was performed prior to and following IV contrast administration for evaluation of spinal metastatic disease. CONTRAST:  None. COMPARISON:  None. FINDINGS: Cervical Findings: Limited sagittal views of the cervical spine demonstrate a diffusely abnormal marrow pattern with multiple osseous lesions present, concerning for multiple myeloma. The most prevalent of these is present in the dens which demonstrates fairly diffuse abnormal STIR signal intensity (series 6, image 6). No significant extra osseous extension of tumor. No pathologic fracture. Scattered mild degenerative disc bulging throughout the cervical spine with posterior element hypertrophy. Associated diffuse canal narrowing without severe canal stenosis. No paraspinous soft tissue mass appreciated. Thoracic Findings: Limited sagittal views of the thoracic spine demonstrate diffusely abnormal marrow pattern with multiple osseous lesions present, again highly concerning for multiple myeloma. Most prominent lesion present within the T3 vertebral body and measures 19 mm. No significant extra osseous extension of tumor identified on this limited exam. No associated pathologic fracture. No paraspinous soft tissue mass appreciated. Lumbar Findings: There are 6 non rib-bearing lumbar type vertebral bodies. The lowest vertebral body is labeled L6. Vertebral body count is made from the odontoid week. Limited sagittal views of the lumbar spine demonstrate diffusely abnormal marrow pattern with innumerable osseous lesions present, highly concerning for multiple myeloma. These are present  throughout the lumbar spine and sacrum. There is associated probable pathologic fracture involving the L3 vertebral body with approximately 50% of central height loss. No significant bony retropulsion. Mild compression deformity at the superior endplate of L2, likely related to chronic Schmorl's node. Large metastatic/myelomatous lesion involves the L5 vertebral body with extension into the left posterior elements. This lesion completely involves the central and left aspect of the L5 vertebral body with destruction of the left posterior elements and extension into the paraspinous soft tissues. Lesion measures approximately 6.0 x 9.0 x 7.5 cm (series 12, image 10). Abnormal posterior convex bowing of the L5 vertebral body with moderate to severe canal stenosis (series 10, image 7). There is extra osseous extension of tumor into the adjacent left L4-5 and L5-6 neural foramina with secondary mild narrowing at L4-5 and more moderate to severe narrowing at L5-6 (series 9, image 12). A second large ovoid lesion involving the left iliac wing is partially visualized (series 12, image 13). No other definite paraspinous soft tissues mass. T2 hyperintense cyst noted within the left kidney. IMPRESSION: 1. 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 Careful correlation to the vertebral body count on this exam is recommended prior to any potential future intervention. 2. Diffusely abnormal marrow signal intensity with innumerable osseous lesions present throughout the spine, concerning for multiple myeloma given the provided history. 3. Large 6.0 x 9.0 x 7.5 cm lesion with associated extraosseous extension of tumor into the left paraspinous soft tissues at L5. Abnormal posterior convex bowing of the L5 vertebral body with moderate to severe canal stenosis.  Neural foraminal encroachment on the left at L4-5 and L5-6 as well. No other significant canal or neural foraminal encroachment within  the lumbar spine. 4. No significant extraosseous tumor or or spinal canal encroachment within the cervical and thoracic spine. 5. Compression deformity of L3 with up to 50% height loss, which may be pathologic in nature. No bony retropulsion. 6. Additional large lesion with extraosseous extension of tumor involving the left iliac wing, incompletely visualized on this exam. Electronically Signed   By: Jeannine Boga M.D.   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 The patient presented with significant bone lesions, renal failure and pancytopenia. Further testing from cytogenetics and FISH anaylsis from recent bone marrow biopsy is pending but biopsy of his bone from the femur confirmed diagnosis of multiple myeloma. I review with him current guidelines. With his renal dysfunction, I would like to avoid triple therapy if possible. I plan to start him on pulse dose dexamethasone 40 mg once a week starting tomorrow. 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 There is no contraindication for him to receive dexamethasone all Velcade while on hemodialysis as they are not renally cleared Hopefully, his hemodialysis treatment can be transitioned to outpatient treatment  Significant bone lesions throughout He had prophylactic  surgery to the left femur. MRI shows significant lesion in the lumbar spine. He was seen by radiation oncologist with plan for radiation therapy in a palliative fashion to the femur and the lumbar spine  Severe pancytopenia, vitamin B-12 deficiency Multifactorial, likely due to multiple myeloma and severe vitamin B-12 deficiency and renal failure I agreed for him to receive blood transfusion to keep hemoglobin greater than 7 I have added one injection of vitamin B 12 tomorrow. He will continue high-dose supplement daily by mouth  Severe constipation Multifactorial likely due to dehydration and medication side effects I will add around-the-clock Senokot  Severe vitamin D deficiency I started him on high-dose replacement therapy  Severe protein calorie malnutrition Continue dietitian consult and frequent small meals  Discharge planning Unknown, likely next week. I will return on Monday at 1 PM to see the patient Hopefully, we can transition his future treatment to outpatient  [facility], NI, MD 05/17/2016  2:36 PM

## 2016-05-18 DIAGNOSIS — N184 Chronic kidney disease, stage 4 (severe): Secondary | ICD-10-CM

## 2016-05-18 LAB — TYPE AND SCREEN
ABO/RH(D): A POS
Antibody Screen: NEGATIVE
UNIT DIVISION: 0
Unit division: 0
Unit division: 0
Unit division: 0

## 2016-05-18 LAB — COMPREHENSIVE METABOLIC PANEL
ALBUMIN: 1.9 g/dL — AB (ref 3.5–5.0)
ALK PHOS: 54 U/L (ref 38–126)
ALT: 14 U/L — AB (ref 17–63)
ANION GAP: 7 (ref 5–15)
AST: 22 U/L (ref 15–41)
BILIRUBIN TOTAL: 1.5 mg/dL — AB (ref 0.3–1.2)
BUN: 22 mg/dL — AB (ref 6–20)
CALCIUM: 7.9 mg/dL — AB (ref 8.9–10.3)
CO2: 28 mmol/L (ref 22–32)
CREATININE: 5.75 mg/dL — AB (ref 0.61–1.24)
Chloride: 94 mmol/L — ABNORMAL LOW (ref 101–111)
GFR calc Af Amer: 11 mL/min — ABNORMAL LOW (ref >60)
GFR calc non Af Amer: 9 mL/min — ABNORMAL LOW (ref >60)
GLUCOSE: 97 mg/dL (ref 65–99)
Potassium: 4 mmol/L (ref 3.5–5.1)
Sodium: 129 mmol/L — ABNORMAL LOW (ref 135–145)
TOTAL PROTEIN: 7.3 g/dL (ref 6.5–8.1)

## 2016-05-18 LAB — URIC ACID: Uric Acid, Serum: 5.9 mg/dL (ref 4.4–7.6)

## 2016-05-18 MED ORDER — PROCHLORPERAZINE MALEATE 10 MG PO TABS
10.0000 mg | ORAL_TABLET | Freq: Once | ORAL | Status: AC
  Administered 2016-05-18: 10 mg via ORAL
  Filled 2016-05-18: qty 1

## 2016-05-18 MED ORDER — BORTEZOMIB CHEMO SQ INJECTION 3.5 MG (2.5MG/ML)
1.3000 mg/m2 | Freq: Once | INTRAMUSCULAR | Status: AC
  Administered 2016-05-18: 2.5 mg via SUBCUTANEOUS
  Filled 2016-05-18: qty 2.5

## 2016-05-18 NOTE — Progress Notes (Signed)
PROGRESS NOTE  Thomas [name]  SNK:[id number] DOB: [birth year] DOA: 05/09/2016 PCP: No PCP Per Patient  Brief Narrative:   70 year old male with no significant medical history (had not seen a physician in many years) presented with about 1 month of nausea with daily vomiting. Also reports poor by mouth intake and subjective weight loss. Denied any diarrhea, hematemesis or bleeding per rectum. Patient went to the urgent care and was found to have a hemoglobin of 9.8 and sent to the ED. In the ED blood work showed creatinine >10 and BUN of 40. He was taking high dose of NSAIDs about 3 months back for severe low back pain, prescribed by his chiropractor. Reports that he told them for less than 2 weeks.  He was also found to have severe hyperkalemia and given insulin with dextrose. Admitted to hospitalist service for further management. Lab work showed hyperproteinemia, abnormal lucency of lumbar vertebrae and multiple radiolucencies in the skull x-ray indicated towards multiple myeloma.  Oncology was consulted.  He was also found to have pathological fracture of left femur and underwent IM nailing.  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 -SPEP, light chains and IFE consistent with IgG kappa multiple myeloma. -Renal biopsy results Still pending.  - Bone biopsy from left hip surgery:  Sheets of atypical plasma cells - Started hemodialysis 6/2 -  CLIP process started -  Vein mapping complete. Vascular surgery has  seen regarding need for permanent dialysis access.  Multiple myeloma with diffuse skeletal lesions -Bone survey showing metastatic bone lesions with largest lesion involving left iliac wing. Also lesion involving midshaft of left femur with pathological fracture. L3 and L5 compression fracture. - MRI of the spine shows large lumbar lesion associated with extraosseous extension of tumor into the left paraspinous soft tissue at L5 with moderate to severe canal stenosis. Evaluated by neurosurgery and given no neurological deficit recommends no surgical intervention. Recommend radiation therapy.  - plan for outpatient palliative radiation by Dr. Lisbeth Renshaw - Bone marrow biopsy done on 6/6.  - Dr. Alvy Bimler following -  Starting dexamethasone and velcade on 6/8.  RN from [facility] to come to [facility] to provide Velcade   Low grade fever, asymptomatic, may be post-operative atelectasis.  Clinically well appearing.  No sepsis. -  CXR:  Possible left lower lobe infiltrate vs. atelectasis -  IS and OOB to chair as soon as possible -  If he becomes sicker, blood cultures and pneumonia order set for aspiration pneumonia -  MRSA PCR negative on 6/4 so would probably not start vancomycin  -  UA pending (nearly anuric)  Pathological fracture of left femur IM nailing with open biopsy of the left femur with intraoperative frozen section done on 6/6.Marland Kitchen Results pending. continue pain control.  Anemia due to ARF, myeloma, and vitamin b12 deficiency - Multiple transfusions during the course of this hospitalization. Follow CBCs.  Progressive thrombocytopenia. Platelet function assay is high. Avoid heparin products.  Received 2 u platelets.  Vitamin B12 deficiency (106). - anti-IF - anti-parietal cell -  IM supplementation for now  Hyperkalemia, resolved. Now on dialysis.  Malnutrition of moderate degree -  Renal diet with supplements  Constipation  -  Daily miralax -  Bisacodyl suppository -  Continue  senna  DVT prophylaxis:  SCDs Code Status:  full Family Communication:  Discussed with patient. No family at bedside. Disposition Plan:  Pending arrangements for outpatient HD and SNF placement.  SW consult placed.  CLIP started.As per nephrology service, will not be ready for discharge until sometime next week.    Consultants:  Nephrology (Dr. Kairo Footman) IR Oncology (Dr. Alvy Bimler) Orthopedics (Dr Delfino Lovett) Neurosurgery Radiation onc ( Dr Tammi Klippel)  Procedures:  Left renal biopsy Bone marrow biopsy on 6/6 IM nailding of the left femur fracture  Antimicrobials:   none    Subjective: Seen at HD this morning. Denied complaints. No pain reported.   Objective: Filed Vitals:   05/18/16 1100 05/18/16 1130 05/18/16 1200 05/18/16 1215  BP: 119/55 112/56 110/54 125/59  Pulse: 84 81 82 86  Temp:    98.1 F (36.7 C)  TempSrc:    Oral  Resp: 21 28 26 15   Height:      Weight:    74.9 kg (165 lb 2 oz)  SpO2:        Intake/Output Summary (Last 24 hours) at 05/18/16 1527 Last data filed at 05/18/16 1215  Gross per 24 hour  Intake    335 ml  Output   2000 ml  Net  -1665 ml   Filed Weights   05/16/16 1544 05/18/16 0800 05/18/16 1215  Weight: 77.2 kg (170 lb 3.1 oz) 76.9 kg (169 lb 8.5 oz) 74.9 kg (165 lb 2 oz)    Examination:  General exam:  Pleasant middle-aged male lying comfortably in bed undergoing hemodialysis. Look cachectic and chronically ill.  HEENT:  NCAT, MMM Respiratory system:  Diminished at the bilateral bases. Rest of lung fields clear to auscultation. No increased work of breathing.  Cardiovascular system: Regular rate and rhythm, normal E9/B2.  2/6 systolic murmur, no gallops or clicks.  Warm extremities Gastrointestinal system: Normal active bowel sounds, soft, nondistended, nontender. MSK:  Normal tone and bulk, 1+ left lower extremity edema, did not remove ACE bandage today to examine thigh.  Neuro:  Grossly intact. Alert and oriented. No focal deficits.      Data Reviewed: I have personally reviewed following labs and imaging studies  CBC:  Recent Labs Lab 05/14/16 1812 05/15/16 0743 05/16/16 0949 05/17/16 0648 05/18/16 0330  WBC 6.8 5.9 7.4 6.5 5.5  HGB 7.6* 6.5* 7.3* 6.7* 8.4*  HCT 22.1* 18.2* 21.8* 20.2* 25.3*  MCV 87.4 84.7 89.7 89.4 87.5  PLT 99* 95* 93* 111* 841*   Basic Metabolic Panel:  Recent Labs Lab 05/12/16 0913 05/13/16 1223  05/14/16 1812 05/15/16 0743 05/16/16 0949 05/17/16 0648 05/18/16 0330  NA 129* 128*  < > 130* 130* 134* 132* 129*  K 5.3* 4.4  < > 5.2* 5.3* 4.6 3.8 4.0  CL 100* 97*  < > 101 98* 100* 93* 94*  CO2 17* 23  < > 22 22 26 28 28   GLUCOSE 111* 112*  < > 136* 110* 92 77 97  BUN 53* 36*  < > 31* 39* 25* 14 22*  CREATININE 13.22* 9.93*  < > 8.32* 9.26* 6.84* 4.49* 5.75*  CALCIUM 8.2* 8.1*  < > 7.6* 7.7* 7.9* 7.8* 7.9*  PHOS 6.0* 5.1*  --   --   --   --  4.6  --   < > = values in this interval not displayed. GFR: Estimated Creatinine Clearance: 13 mL/min (by C-G formula based on Cr of 5.75). Liver Function Tests:  Recent Labs Lab 05/12/16 0913 05/13/16 1223 05/17/16 0648 05/18/16 0330  AST  --   --   --  22  ALT  --   --   --  14*  ALKPHOS  --   --   --  54  BILITOT  --   --   --  1.5*  PROT  --   --   --  7.3  ALBUMIN 2.1* 1.9* 1.9* 1.9*   No results for input(s): LIPASE, AMYLASE in the last 168 hours. No results for input(s): AMMONIA in the last 168 hours. Coagulation Profile: No results for input(s): INR, PROTIME in the last 168 hours. Cardiac Enzymes: No results for input(s): CKTOTAL, CKMB, CKMBINDEX, TROPONINI in the last 168 hours. BNP (last 3 results) No results for input(s): PROBNP in the last 8760 hours. HbA1C: No results for input(s): HGBA1C in the last 72 hours. CBG: No results for input(s): GLUCAP in the last 168 hours. Lipid Profile: No results for input(s): CHOL, HDL, LDLCALC, TRIG, CHOLHDL, LDLDIRECT in the last 72 hours. Thyroid Function Tests: No  results for input(s): TSH, T4TOTAL, FREET4, T3FREE, THYROIDAB in the last 72 hours. Anemia Panel: No results for input(s): VITAMINB12, FOLATE, FERRITIN, TIBC, IRON, RETICCTPCT in the last 72 hours. Urine analysis:    Component Value Date/Time   COLORURINE YELLOW 05/10/2016 0812   APPEARANCEUR CLOUDY* 05/10/2016 0812   LABSPEC 1.017 05/10/2016 0812   PHURINE 6.0 05/10/2016 0812   GLUCOSEU 100* 05/10/2016 0812   HGBUR SMALL* 05/10/2016 0812   BILIRUBINUR NEGATIVE 05/10/2016 0812   KETONESUR NEGATIVE 05/10/2016 0812   PROTEINUR 100* 05/10/2016 0812   NITRITE NEGATIVE 05/10/2016 0812   LEUKOCYTESUR MODERATE* 05/10/2016 0812   Sepsis Labs: @LABRCNTIP (procalcitonin:4,lacticidven:4)  ) Recent Results (from the past 240 hour(s))  Surgical pcr screen     Status: None   Collection Time: 05/14/16  1:00 AM  Result Value Ref Range Status   MRSA, PCR NEGATIVE NEGATIVE Final   Staphylococcus aureus NEGATIVE NEGATIVE Final    Comment:        The Xpert SA Assay (FDA approved for NASAL specimens in patients over 41 years of age), is one component of a comprehensive surveillance program.  Test performance has been validated by [facility] for patients greater than or equal to 76 year old. It is not intended to diagnose infection nor to guide or monitor treatment.   Culture, blood (routine x 2)     Status: None (Preliminary result)   Collection Time: 05/16/16  8:05 PM  Result Value Ref Range Status   Specimen Description BLOOD HEMODIALYSIS CATHETER  Final   Special Requests BOTTLES DRAWN AEROBIC AND ANAEROBIC 10CC  Final   Culture NO GROWTH < 24 HOURS  Final   Report Status PENDING  Incomplete  Culture, blood (routine x 2)     Status: None (Preliminary result)   Collection Time: 05/16/16  8:15 PM  Result Value Ref Range Status   Specimen Description BLOOD HEMODIALYSIS CATHETER  Final   Special Requests BOTTLES DRAWN AEROBIC AND ANAEROBIC 10CC  Final   Culture NO GROWTH < 24 HOURS   Final   Report Status PENDING  Incomplete      Radiology Studies: Dg Chest Port 1 View  05/17/2016  CLINICAL DATA:  Postop fever, left femur surgery EXAM: PORTABLE  CHEST 1 VIEW COMPARISON:  05/10/2016 FINDINGS: Cardiomediastinal silhouette is stable. There is dual lumen right IJ catheter with tip in SVC right atrium junction. No pneumothorax. There is small left pleural effusion left basilar atelectasis or infiltrate. No pulmonary edema. IMPRESSION: No pulmonary edema. Small left pleural effusion with left basilar atelectasis or infiltrate. Electronically Signed   By: Lahoma Crocker M.D.   On: 05/17/2016 08:27     Scheduled Meds: . sodium chloride   Intravenous Once  . sodium chloride   Intravenous Once  . sodium chloride   Intravenous Once  . acyclovir  200 mg Oral BID  . [START ON 05/19/2016] darbepoetin (ARANESP) injection - DIALYSIS  100 mcg Intravenous Q Fri-HD  . dexamethasone  40 mg Oral Weekly  . docusate sodium  100 mg Oral BID  . feeding supplement (NEPRO CARB STEADY)  237 mL Oral BID BM  . multivitamin  1 tablet Oral QHS  . polyethylene glycol  17 g Oral BID  . senna-docusate  2 tablet Oral BID  . vitamin B-12  1,000 mcg Oral Daily   Continuous Infusions: . sodium chloride    . sodium chloride       LOS: 8 days    Time spent: 30 min    Gloriana Piltz, MD [facility] Pager [phone]  If 7PM-7AM, please contact night-coverage [email or web address] Password [facility] 05/18/2016, 3:27 PM

## 2016-05-18 NOTE — Procedures (Signed)
Pt seen on HD.  Ap 170 VP 130  SBP 124.  BFR 400.  Had vein mapping done.  Will ask [facility] to see about placement of AVF next week

## 2016-05-18 NOTE — Consult Note (Signed)
Hospital Consult    Reason for Consult:  In need of dialysis access Referring Physician:  Mercy Moore MRN #:  [id number]  History of Present Illness: This is a 70 y.o. male who has had a hx of nausea and vomiting for about a month.  He has had some back pain and was taking high doses of NSAIDS.  He did present to the urgent care and labs were drawn and he was sent to the ER.   There he was found to be in renal failure with his creatinine >10 with mild hyperkalemia.    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 He has since underwent open bx of the left femur and intramedullary fixation of teh left femur.  This turned out to be multiple myeloma.  He is now being followed by radiation oncology.  Given his renal failure, we are asked to evaluate for permanent access. The pt is right handed.  Past Medical History  Diagnosis Date  . Compression fracture     Past Surgical History  Procedure Laterality Date  . Mouth surgery    . Femur im nail Left 05/14/2016    Procedure: INTRAMEDULLARY (IM) NAIL FEMORAL;  Surgeon: Rod Can, MD;  Location: [facility];  Service: Orthopedics;  Laterality: Left;  . Bone biopsy Left 05/14/2016    Procedure: LEFT FEMORAL BIOPSY WITH INTRAOPERATIVE FROZEN SECTIONS ;  Surgeon: Rod Can, MD;  Location: [facility];  Service: Orthopedics;  Laterality: Left;    Allergies  Allergen Reactions  . Dilaudid [Hydromorphone Hcl] Other (See Comments)    hallucinations  . Codeine Other (See Comments)    "MAKES ME JUMPY"  . Penicillins     Has patient had a PCN reaction causing immediate rash, facial/tongue/throat swelling, SOB or lightheadedness with hypotension: Yes Has patient had a PCN reaction causing severe rash involving mucus membranes or skin necrosis: Yes Has patient had a PCN reaction that  required hospitalization: No Has patient had a PCN reaction occurring within the last 10 years: No If all of the above answers are "NO", then may proceed with Cephalosporin use.     Prior to Admission medications   Not on File    Social History   Social History  . Marital Status: Married    Spouse Name: N/A  . Number of Children: N/A  . Years of Education: N/A   Occupational History  . Not on file.   Social History Main Topics  . Smoking status: Former Research scientist (life sciences)  . Smokeless tobacco: Not on file  . Alcohol Use: No  . Drug Use: No  . Sexual Activity: Not on file   Other Topics Concern  . Not on file   Social History Narrative     Family History  Problem Relation Age of Onset  . Diabetes Father     ROS: _0  Positive   _1  Negative   _2  All sytems reviewed and are negative  Cardiovascular: _3  chest pain/pressure _4  palpitations _5  SOB lying flat _6  DOE _7  pain in legs while walking _8  pain in legs at rest _9  pain in legs at night _10  non-healing ulcers _11  hx of DVT _12  swelling in legs  Pulmonary: _13  productive cough _14  asthma/wheezing _15  home O2  Neurologic: _16  weakness in _17  arms _18  legs _19  numbness in _20  arms _21  legs _22  hx of CVA _23   mini stroke _0 difficulty speaking or slurred speech _1  temporary loss of vision in one eye _2  dizziness  Hematologic: _3  hx of cancer-newly diagnosed multiple myeloma _4  bleeding problems _5  problems with blood clotting easily  Endocrine:   _6  diabetes _7  thyroid disease  GI _8  vomiting blood _9  blood in stool  GU: _10  CKD/renal failure _11  HD--_12  M/W/F or _13  T/T/S _14  burning with urination _15  blood in urine  Psychiatric: _16  anxiety _17  depression  Musculoskeletal: _18  arthritis _19  joint pain _20  back pain _21  compression fx  Integumentary: _22  rashes _23  ulcers  Constitutional: _24  fever _25  chills _26  N/V _27  weight loss  Physical Examination  Filed Vitals:   05/18/16 0930 05/18/16 1000  BP:  114/56 124/62  Pulse: 81 84  Temp:    Resp: 24 21   Body mass index is 22.37 kg/(m^2).  General:  WDWN in NAD Gait: Not observed Eyes: PERRLA, EOMI HENT: WNL, normocephalic; dry mucous membranes Pulmonary: normal non-labored breathing, without Rales, rhonchi,  wheezing Cardiac: regular, without  Murmurs, rubs or gallops; without carotid bruits Abdomen:  soft, NT/ND, no masses Skin: without rashes Vascular Exam/Pulses:  Right Left  Radial 2+ (normal) 2+ (normal)  Ulnar 2+ (normal) 2+ (normal)  DP Unable to palpate 1+ (weak)  PT 2+ (normal) 2+ (normal)   Extremities: bandaged left groin; sensation and motor in tact lower extremities. Neurologic: A&O X 3; Appropriate Affect ; SENSATION: normal; MOTOR FUNCTION:  moving all extremities equally. Speech is fluent/normal Psychiatric: Judgment intact, Mood & affect appropriate for pt's clinical situation Lymph : No Cervical, Axillary, or Inguinal lymphadenopathy     CBC    Component Value Date/Time   WBC 5.5 05/18/2016 0330   RBC 2.89* 05/18/2016 0330   HGB 8.4* 05/18/2016 0330   HCT 25.3* 05/18/2016 0330   HCT 22.8* 05/10/2016 1156   PLT 107* 05/18/2016 0330   MCV 87.5 05/18/2016 0330   MCH 29.1 05/18/2016 0330   MCHC 33.2 05/18/2016 0330   RDW 16.0* 05/18/2016 0330   LYMPHSABS 1.1 05/09/2016 2144   MONOABS 0.6 05/09/2016 2144   EOSABS 0.1 05/09/2016 2144   BASOSABS 0.0 05/09/2016 2144    BMET    Component Value Date/Time   NA 129* 05/18/2016 0330   K 4.0 05/18/2016 0330   CL 94* 05/18/2016 0330   CO2 28 05/18/2016 0330   GLUCOSE 97 05/18/2016 0330   BUN 22* 05/18/2016 0330   CREATININE 5.75* 05/18/2016 0330   CALCIUM 7.9* 05/18/2016 0330   GFRNONAA 9* 05/18/2016 0330   GFRAA 11* 05/18/2016 0330    COAGS: Lab Results  Component Value Date   INR 1.46 05/11/2016   INR 1.40 05/10/2016     Non-Invasive Vascular Imaging:   BUE Vein mapping 06/10/05:  Right Cephalic AC: 2.69 Above AC: 4.85  Left  Cephalic AC: 4.62  Right Basilic   Segment Diameter Depth Comment  1. Prox upper arm 5.35m 8.937m  2. Mid upper arm 5.8447m.25m57m3. Above AC 4[facility]2mm14m3mm 73mch  4. In AC 5.3[facility] m58m5. Below AC 3.77mm 3.47m bra56m   Left Basilic  Segment Diameter Depth Comment  1. Prox upper arm 4.66mm 7.7761m 2. 29mupper arm 3.62mm 3.79mm10mnch 36mAbove AC 3.24mm 3.44mm  35mIn A14m2mm mm   5. Bel42mAC 3.33mm 4.6mm branc28m    1mtatin:  No. Beta Blocker:  No. Aspirin:  No. ACEI:  No. ARB:  No. Other antiplatelets/anticoagulants:  No.    ASSESSMENT/PLAN: This is a 70 y.o. male with newly diagnosed multiple myeloma and renal failure in need of permanent dialysis access.   -the pt is right handed and based on vein mapping, he would be a candidate for a left basilic vein transposition, which would most likely be done in two stages. Dr. Bridgett Larsson has discussed this with the pt.  -Will discuss timing of surgery with Dr. Bridgett Larsson.   Leontine Locket, PA-C [facility] [phone]  Addendum  I have independently interviewed and examined the patient, and I agree with the physician assistant's findings.  Will plan on L arm fistula placement: L RC vs BC AVF vs BVT on Wednesday.  No OR space before Wednesday.   - will discuss further plans with pt once scheduled   Adele Barthel, MD [facility] Office: [phone] Pager: [phone]  05/18/2016, 11:19 AM

## 2016-05-18 NOTE — Progress Notes (Addendum)
   Subjective:  Patient reports pain as mild to moderate.  No c/o.  Objective:   VITALS:   Filed Vitals:   05/17/16 1820 05/17/16 1853 05/17/16 2245 05/18/16 0458  BP: 115/56 117/57 146/79 132/56  Pulse: 83 84 97 83  Temp: 98.5 F (36.9 C) 98.3 F (36.8 C) 98.2 F (36.8 C) 98.4 F (36.9 C)  TempSrc: Oral Oral Oral   Resp: 18   18  Height:      Weight:      SpO2: 99% 99% 96% 99%    ABD soft Sensation intact distally Intact pulses distally Dorsiflexion/Plantar flexion intact Incision: dressing C/D/I Compartment soft hip spica wrap intact   Lab Results  Component Value Date   WBC 5.5 05/18/2016   HGB 8.4* 05/18/2016   HCT 25.3* 05/18/2016   MCV 87.5 05/18/2016   PLT 107* 05/18/2016   BMET    Component Value Date/Time   NA 129* 05/18/2016 0330   K 4.0 05/18/2016 0330   CL 94* 05/18/2016 0330   CO2 28 05/18/2016 0330   GLUCOSE 97 05/18/2016 0330   BUN 22* 05/18/2016 0330   CREATININE 5.75* 05/18/2016 0330   CALCIUM 7.9* 05/18/2016 0330   GFRNONAA 9* 05/18/2016 0330   GFRAA 11* 05/18/2016 0330     Assessment/Plan: 4 Days Post-Op   Principal Problem:   Kidney failure Active Problems:   Renal tubular acidosis, type 4   Occult blood positive stool   Malnutrition of moderate degree   Acute renal failure (HCC)   Myeloma (HCC)   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

## 2016-05-18 NOTE — Progress Notes (Signed)
   05/18/16 1641  Clinical Encounter Type  Visited With Patient and family together  Visit Type Spiritual support  Referral From Nurse  Spiritual Encounters  Spiritual Needs Sacred text;Prayer;Emotional  Stress Factors  Patient Stress Factors Health changes  Family Stress Factors Health changes  Chaplain visit made, provided emoitional support and ministry of presence. Theodicy was explored, scripture read. Advised family that further services are available 24/7 upon request.

## 2016-05-18 NOTE — Clinical Social Work Note (Signed)
Clinical Social Work Assessment  Patient Details  Name: Thomas [name] MRN: [id number] Date of Birth: 12-11-1946  Date of referral:  05/14/16               Reason for consult:  Facility Placement                Permission sought to share information with:    Permission granted to share information::  Yes, Verbal Permission Granted  Name::     Thomas [name]  Agency::     Relationship::  Wife  Contact Information:  [phone]  Housing/Transportation Living arrangements for the past 2 months:  Single Family Home Source of Information:  Spouse Patient Interpreter Needed:  None Criminal Activity/Legal Involvement Pertinent to Current Situation/Hospitalization:  No - Comment as needed Significant Relationships:  Adult Children, Spouse, Other Family Members Lives with:  Spouse Do you feel safe going back to the place where you live?  Yes (Patient feels safe at home, but he and wife understand that rehab needed before patient returns home.) Need for family participation in patient care:  Yes (Comment) (Patient requested that his wife be involved in talking with CSW regarding discharge planning.)  Care giving concerns:  Wife explained that her husband had not been sick or in the hospital before. She agrees that patient can benefit from rehab (be it inpatient rehab or ST rehab in a skilled facility) and indicated that she and her daughter would be available to care for him at home once rehab completed.   Social Worker assessment / plan:  CSW talked with wife, Thomas [name] and patient at the bedside regarding discharge planning and recommendation of rehab at discharge. Thomas [name] was lying in bed and wife was at the bedside rubbing his leg as patient was in pain. Thomas [name] explained that her husband has never really been sick and has never been in the hospital before, but experienced an injury getting off a tractor then soon after was not feeling well and was taken to the ED at [facility].  Wife is  in agreement with rehab post discharge and is hopeful that patient can go to rehab in the hospital. CSW explained skilled facility search process and provided Thomas [name] with a list of facilities in [place]. Her preference is [facility] as she lives near this facility, however not sure if they accept patient's insurance.   Employment status:  Retired Forensic scientist:  [employer]) PT Recommendations:  [place], [place] / Referral to community resources:  [place] (Wife provided with skilled facility list for [facility])  Patient/Family's Response to care:  Wife expressed no concerns regarding patient's care during hospitalization.  Patient/Family's Understanding of and Emotional Response to Diagnosis, Current Treatment, and Prognosis:  Not discussed.  Emotional Assessment Appearance:  Appears older than stated age Attitude/Demeanor/Rapport:  Other (Appropriate) Affect (typically observed):  Appropriate, Quiet (Patient was in pain) Orientation:  Oriented to Self, Oriented to Place, Oriented to  Time, Oriented to Situation Alcohol / Substance use:  Tobacco Use (Patient reported that he quit smoking and does not drink or use illicit drugs.) Psych involvement (Current and /or in the community):  No (Comment)  Discharge Needs  Concerns to be addressed:  Discharge Planning Concerns Readmission within the last 30 days:  No Current discharge risk:  None Barriers to Discharge:  No Barriers Identified   Sable Feil, LCSW 05/18/2016, 4:11 PM

## 2016-05-18 NOTE — Progress Notes (Signed)
OT Cancellation Note  Patient Details Name: Macai Mulroy MRN: [id number] DOB: 10-09-1946   Cancelled Treatment:    Reason Eval/Treat Not Completed: Patient at procedure or test/ unavailable.  Pt in HD.  Will reattempt.   [place], OTR/L [id number]   Lucille Passy M 05/18/2016, 10:04 AM

## 2016-05-18 NOTE — Care Management Important Message (Signed)
Important Message  Patient Details  Name: Thomas [name] MRN: [id number] Date of Birth: May 19, 1946   Medicare Important Message Given:  Yes    [name] 05/18/2016, 10:25 AM

## 2016-05-19 ENCOUNTER — Encounter (HOSPITAL_COMMUNITY): Payer: Self-pay

## 2016-05-19 LAB — URIC ACID: URIC ACID, SERUM: 4.9 mg/dL (ref 4.4–7.6)

## 2016-05-19 LAB — COMPREHENSIVE METABOLIC PANEL
ALK PHOS: 55 U/L (ref 38–126)
ALT: 13 U/L — ABNORMAL LOW (ref 17–63)
AST: 20 U/L (ref 15–41)
Albumin: 1.8 g/dL — ABNORMAL LOW (ref 3.5–5.0)
Anion gap: 6 (ref 5–15)
BILIRUBIN TOTAL: 1.2 mg/dL (ref 0.3–1.2)
BUN: 16 mg/dL (ref 6–20)
CALCIUM: 7.8 mg/dL — AB (ref 8.9–10.3)
CO2: 28 mmol/L (ref 22–32)
CREATININE: 4.04 mg/dL — AB (ref 0.61–1.24)
Chloride: 95 mmol/L — ABNORMAL LOW (ref 101–111)
GFR calc Af Amer: 16 mL/min — ABNORMAL LOW (ref >60)
GFR, EST NON AFRICAN AMERICAN: 14 mL/min — AB (ref >60)
Glucose, Bld: 217 mg/dL — ABNORMAL HIGH (ref 65–99)
POTASSIUM: 3.6 mmol/L (ref 3.5–5.1)
Sodium: 129 mmol/L — ABNORMAL LOW (ref 135–145)
TOTAL PROTEIN: 7.4 g/dL (ref 6.5–8.1)

## 2016-05-19 LAB — ANTI-PARIETAL ANTIBODY: Parietal Cell Antibody-IgG: 3.7 Units (ref 0.0–20.0)

## 2016-05-19 LAB — INTRINSIC FACTOR ANTIBODIES: INTRINSIC FACTOR: 1 [AU]/ml (ref 0.0–1.1)

## 2016-05-19 MED ORDER — DARBEPOETIN ALFA 100 MCG/0.5ML IJ SOSY
100.0000 ug | PREFILLED_SYRINGE | INTRAMUSCULAR | Status: DC
  Administered 2016-05-20: 100 ug via INTRAVENOUS
  Filled 2016-05-19 (×2): qty 0.5

## 2016-05-19 NOTE — Progress Notes (Signed)
S:  Eating better.  Says he is to work with PT today O:BP 120/58 mmHg  Pulse 73  Temp(Src) 97.9 F (36.6 C) (Oral)  Resp 18  Ht 6\' 1"  (1.854 m)  Wt 74.9 kg (165 lb 2 oz)  BMI 21.79 kg/m2  SpO2 99%  Intake/Output Summary (Last 24 hours) at 05/19/16 1003 Last data filed at 05/19/16 0911  Gross per 24 hour  Intake    600 ml  Output   2000 ml  Net  -1400 ml   Weight change:  [id number] and alert CVS: RRR Resp: clear Abd:+ BS NTND Ext:No edema   Decreased ROM Lt leg NEURO:CNI Ox3 no asterixis Rt IJ PC   . sodium chloride   Intravenous Once  . sodium chloride   Intravenous Once  . sodium chloride   Intravenous Once  . acyclovir  200 mg Oral BID  . darbepoetin (ARANESP) injection - DIALYSIS  100 mcg Intravenous Q Fri-HD  . dexamethasone  40 mg Oral Weekly  . docusate sodium  100 mg Oral BID  . feeding supplement (NEPRO CARB STEADY)  237 mL Oral BID BM  . multivitamin  1 tablet Oral QHS  . polyethylene glycol  17 g Oral BID  . senna-docusate  2 tablet Oral BID  . vitamin B-12  1,000 mcg Oral Daily   No results found. BMET    Component Value Date/Time   NA 129* 05/19/2016 0604   K 3.6 05/19/2016 0604   CL 95* 05/19/2016 0604   CO2 28 05/19/2016 0604   GLUCOSE 217* 05/19/2016 0604   BUN 16 05/19/2016 0604   CREATININE 4.04* 05/19/2016 0604   CALCIUM 7.8* 05/19/2016 0604   GFRNONAA 14* 05/19/2016 0604   GFRAA 16* 05/19/2016 0604   CBC    Component Value Date/Time   WBC 5.5 05/18/2016 0330   RBC 2.89* 05/18/2016 0330   HGB 8.4* 05/18/2016 0330   HCT 25.3* 05/18/2016 0330   HCT 22.8* 05/10/2016 1156   PLT 107* 05/18/2016 0330   MCV 87.5 05/18/2016 0330   MCH 29.1 05/18/2016 0330   MCHC 33.2 05/18/2016 0330   RDW 16.0* 05/18/2016 0330   LYMPHSABS 1.1 05/09/2016 2144   MONOABS 0.6 05/09/2016 2144   EOSABS 0.1 05/09/2016 2144   BASOSABS 0.0 05/09/2016 2144     Assessment: 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 MM 3. Comp fx  Lt femur SP intramedullary  fixation 4. L5 mass  Rad onc to see per NS 5. Anemia on aranesp Plan:  1. HD tomorrow   [place] Fanara T

## 2016-05-19 NOTE — Clinical Social Work Placement (Signed)
   CLINICAL SOCIAL WORK PLACEMENT  NOTE  Date:  05/19/2016  Patient Details  Name: Thomas [name] MRN: [id number] Date of Birth: [birth year]  Clinical Social Work is seeking post-discharge placement for this patient at the [place] level of care (*CSW will initial, date and re-position this form in  chart as items are completed):  Yes (05/18/16)   Patient/family provided with [facility] Work Department's list of facilities offering this level of care within the geographic area requested by the patient (or if unable, by the patient's family).  Yes   Patient/family informed of their freedom to choose among providers that offer the needed level of care, that participate in Medicare, Medicaid or managed care program needed by the patient, have an available bed and are willing to accept the patient.  Yes   Patient/family informed of [facility]'s ownership interest in [facility] and [facility], as well as of the fact that they are under no obligation to receive care at these facilities.  PASRR submitted to EDS on 05/17/16     PASRR number received on 05/17/16     Existing PASRR number confirmed on       FL2 transmitted to all facilities in geographic area requested by pt/family on 05/18/16     FL2 transmitted to all facilities within larger geographic area on       Patient informed that his/her managed care company has contracts with or will negotiate with certain facilities, including the following:         05/19/16 - Patient/family informed of bed offers received.  Patient chooses bed at       Physician recommends and patient chooses bed at      Patient to be transferred to   on  .  Patient to be transferred to facility by       Patient family notified on   of transfer.  Name of family member notified:        PHYSICIAN       Additional Comment:    _______________________________________________ Sable Feil,  LCSW 05/19/2016, 2:02 PM

## 2016-05-19 NOTE — Progress Notes (Signed)
Inpatient Rehabilitation  Continuing to follow from a distance.  PT had recommended IP Rehab; however, patient only tolerating sitting up at EOB upon last visit.  No new therapy notes to review at this time.  Plan to continue to follow for improved activity tolerance before recommending IP Rehab consult.  My co-worker Gerlean Ren will follow up next week.    Carmelia Roller., CCC/SLP Admission Coordinator  [place]  Cell [phone]

## 2016-05-19 NOTE — Progress Notes (Signed)
PROGRESS NOTE  Thomas [name]  DGU:[id number] DOB: 06-05-1946 DOA: 05/09/2016 PCP: No PCP Per Patient  Brief Narrative:   70 year old male with no significant medical history (had not seen a physician in many years) presented with about 1 month of nausea with daily vomiting. Also reports poor by mouth intake and subjective weight loss. Denied any diarrhea, hematemesis or bleeding per rectum. Patient went to the urgent care and was found to have a hemoglobin of 9.8 and sent to the ED. In the ED blood work showed creatinine >10 and BUN of 40. He was taking high dose of NSAIDs about 3 months back for severe low back pain, prescribed by his chiropractor. Reports that he told them for less than 2 weeks.  He was also found to have severe hyperkalemia and given insulin with dextrose. Admitted to hospitalist service for further management. Lab work showed hyperproteinemia, abnormal lucency of lumbar vertebrae and multiple radiolucencies in the skull x-ray indicated towards multiple myeloma.  Oncology was consulted.  He was also found to have pathological fracture of left femur and underwent IM nailing.  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 -SPEP, light chains and IFE consistent with IgG kappa multiple myeloma. -Renal biopsy results Still pending.  - Bone biopsy from left hip surgery:  Sheets of atypical plasma cells - Started hemodialysis 6/2 -  CLIP process started -  Vein mapping complete. Vascular surgery has  seen regarding need for permanent dialysis access.  Multiple myeloma with diffuse skeletal lesions -Bone survey showing metastatic bone lesions with largest lesion involving left iliac wing. Also lesion involving midshaft of left femur with pathological fracture. L3 and L5 compression fracture. - MRI of the spine shows large lumbar lesion associated with extraosseous extension of tumor into the left paraspinous soft tissue at L5 with moderate to severe canal stenosis. Evaluated by neurosurgery and given no neurological deficit recommends no surgical intervention. Recommend radiation therapy.  - plan for outpatient palliative radiation by Dr. Lisbeth Renshaw - Bone marrow biopsy done on 6/6.  - Dr. Alvy Bimler following -  Starting dexamethasone and velcade on 6/8.  RN from [facility] to come to [facility] to provide Velcade   Low grade fever, asymptomatic, may be post-operative atelectasis.  Clinically well appearing.  No sepsis. -  CXR:  Possible left lower lobe infiltrate vs. atelectasis -  IS and OOB to chair as soon as possible -  If he becomes sicker, blood cultures and pneumonia order set for aspiration pneumonia -  MRSA PCR negative on 6/4 so would probably not start vancomycin  -  UA pending (nearly anuric)  Pathological fracture of left femur IM nailing with open biopsy of the left femur with intraoperative frozen section done on 6/6.Marland Kitchen Results pending. continue pain control. Outpatient follow-up with orthopedics after discharge-we'll need outpatient XRT to left femur.  Anemia due to ARF, myeloma, and vitamin b12 deficiency - Multiple transfusions during the course of this hospitalization. Follow CBCs.  Progressive thrombocytopenia. Platelet function  assay is high. Avoid heparin products.  Received 2 u platelets.  Vitamin B12 deficiency (106). - anti-IF - anti-parietal cell -  IM supplementation for now  Hyperkalemia, resolved. Now on dialysis.  Malnutrition of moderate degree -  Renal diet with  supplements  Constipation  -  Daily miralax -  Bisacodyl suppository -  Continue senna  DVT prophylaxis:  SCDs Code Status:  full Family Communication:  Discussed with patient and spouse at bedside. Disposition Plan:  Pending arrangements for outpatient HD and SNF placement.  SW consult placed.  CLIP started.As per nephrology service, will not be ready for discharge until sometime next week.    Consultants:  Nephrology (Dr. Navdeep Footman) IR Oncology (Dr. Alvy Bimler) Orthopedics (Dr Delfino Lovett) Neurosurgery Radiation onc ( Dr Tammi Klippel)  Procedures:  Left renal biopsy Bone marrow biopsy on 6/6 IM nailding of the left femur fracture  Antimicrobials:   none    Subjective: Seen at HD this morning. Wife at bedside. No specific complaints.   Objective: Filed Vitals:   05/18/16 2030 05/19/16 0550 05/19/16 0909 05/19/16 1622  BP: 114/60 117/62 120/58 129/46  Pulse: 86 70 73 82  Temp: 98.3 F (36.8 C) 97.4 F (36.3 C) 97.9 F (36.6 C) 98.2 F (36.8 C)  TempSrc: Oral Oral Oral Oral  Resp: _0 Height:      Weight:      SpO2: 98% 99% 99% 95%    Intake/Output Summary (Last 24 hours) at 05/19/16 1810 Last data filed at 05/19/16 1300  Gross per 24 hour  Intake    480 ml  Output      0 ml  Net    480 ml   Filed Weights   05/16/16 1544 05/18/16 0800 05/18/16 1215  Weight: 77.2 kg (170 lb 3.1 oz) 76.9 kg (169 lb 8.5 oz) 74.9 kg (165 lb 2 oz)    Examination:  General exam:  Pleasant middle-aged male lying comfortably in bed. Looks cachectic and chronically ill.  HEENT:  NCAT, MMM Respiratory system:  Diminished at the bilateral bases. Rest of lung fields clear to auscultation. No increased work of breathing.  Cardiovascular system: Regular rate and rhythm, normal V4/Q5.  2/6 systolic murmur, no gallops or clicks.  Warm extremities Gastrointestinal system: Normal active bowel sounds, soft, nondistended, nontender. MSK:  Normal tone and bulk, 1+ left lower extremity  edema.  Neuro:  Grossly intact. Alert and oriented. No focal deficits.     Data Reviewed: I have personally reviewed following labs and imaging studies  CBC:  Recent Labs Lab 05/14/16 1812 05/15/16 0743 05/16/16 0949 05/17/16 0648 05/18/16 0330  WBC 6.8 5.9 7.4 6.5 5.5  HGB 7.6* 6.5* 7.3* 6.7* 8.4*  HCT 22.1* 18.2* 21.8* 20.2* 25.3*  MCV 87.4 84.7 89.7 89.4 87.5  PLT 99* 95* 93* 111* 956*   Basic Metabolic Panel:  Recent Labs Lab 05/13/16 1223  05/15/16 0743 05/16/16 0949 05/17/16 0648 05/18/16 0330 05/19/16 0604  NA 128*  < > 130* 134* 132* 129* 129*  K 4.4  < > 5.3* 4.6 3.8 4.0 3.6  CL 97*  < > 98* 100* 93* 94* 95*  CO2 23  < > _1 GLUCOSE 112*  < > 110* 92 77 97 217*  BUN 36*  < > 39* 25* 14 22* 16  CREATININE 9.93*  < > 9.26* 6.84* 4.49* 5.75* 4.04*  CALCIUM 8.1*  < > 7.7* 7.9* 7.8* 7.9* 7.8*  PHOS 5.1*  --   --   --  4.6  --   --   < > = values in this interval not displayed. GFR: Estimated Creatinine Clearance: 18.5 mL/min (by C-G formula based on Cr of 4.04). Liver Function Tests:  Recent Labs Lab 05/13/16 1223 05/17/16 0648 05/18/16 0330 05/19/16 0604  AST  --   --  22 20  ALT  --   --  14* 13*  ALKPHOS  --   --  54 55  BILITOT  --   --  1.5* 1.2  PROT  --   --  7.3 7.4  ALBUMIN 1.9* 1.9* 1.9* 1.8*   No results for input(s): LIPASE, AMYLASE in the last 168 hours. No results for input(s): AMMONIA in the last 168 hours. Coagulation Profile: No results for input(s): INR, PROTIME in the last 168 hours. Cardiac Enzymes: No results for input(s): CKTOTAL, CKMB, CKMBINDEX, TROPONINI in the last 168 hours. BNP (last 3 results) No results for input(s): PROBNP in the last 8760 hours. HbA1C: No results for input(s): HGBA1C in the last 72 hours. CBG: No results for input(s): GLUCAP in the last 168 hours. Lipid Profile: No results for input(s): CHOL, HDL, LDLCALC, TRIG, CHOLHDL, LDLDIRECT in the last 72 hours. Thyroid Function Tests: No  results for input(s): TSH, T4TOTAL, FREET4, T3FREE, THYROIDAB in the last 72 hours. Anemia Panel: No results for input(s): VITAMINB12, FOLATE, FERRITIN, TIBC, IRON, RETICCTPCT in the last 72 hours. Urine analysis:    Component Value Date/Time   COLORURINE YELLOW 05/10/2016 0812   APPEARANCEUR CLOUDY* 05/10/2016 0812   LABSPEC 1.017 05/10/2016 0812   PHURINE 6.0 05/10/2016 0812   GLUCOSEU 100* 05/10/2016 0812   HGBUR SMALL* 05/10/2016 0812   BILIRUBINUR NEGATIVE 05/10/2016 0812   KETONESUR NEGATIVE 05/10/2016 0812   PROTEINUR 100* 05/10/2016 0812   NITRITE NEGATIVE 05/10/2016 0812   LEUKOCYTESUR MODERATE* 05/10/2016 0812   Sepsis Labs: '@LABRCNTIP'$ (procalcitonin:4,lacticidven:4)  ) Recent Results (from the past 240 hour(s))  Surgical pcr screen     Status: None   Collection Time: 05/14/16  1:00 AM  Result Value Ref Range Status   MRSA, PCR NEGATIVE NEGATIVE Final   Staphylococcus aureus NEGATIVE NEGATIVE Final    Comment:        The Xpert SA Assay (FDA approved for NASAL specimens in patients over 66 years of age), is one component of a comprehensive surveillance program.  Test performance has been validated by [facility] for patients greater than or equal to 71 year old. It is not intended to diagnose infection nor to guide or monitor treatment.   Culture, blood (routine x 2)     Status: None (Preliminary result)   Collection Time: 05/16/16  8:05 PM  Result Value Ref Range Status   Specimen Description BLOOD HEMODIALYSIS CATHETER  Final   Special Requests BOTTLES DRAWN AEROBIC AND ANAEROBIC 10CC  Final   Culture NO GROWTH 3 DAYS  Final   Report Status PENDING  Incomplete  Culture, blood (routine x 2)     Status: None (Preliminary result)   Collection Time: 05/16/16  8:15 PM  Result Value Ref Range Status   Specimen Description BLOOD HEMODIALYSIS CATHETER  Final   Special Requests BOTTLES DRAWN AEROBIC AND ANAEROBIC 10CC  Final   Culture NO GROWTH 3 DAYS  Final    Report Status PENDING  Incomplete      Radiology Studies: No results found.   Scheduled Meds: . sodium chloride   Intravenous Once  . sodium chloride   Intravenous Once  . sodium chloride  Intravenous Once  . acyclovir  200 mg Oral BID  . [START ON 05/20/2016] darbepoetin (ARANESP) injection - DIALYSIS  100 mcg Intravenous Q Sat-HD  . dexamethasone  40 mg Oral Weekly  . docusate sodium  100 mg Oral BID  . feeding supplement (NEPRO CARB STEADY)  237 mL Oral BID BM  . multivitamin  1 tablet Oral QHS  . polyethylene glycol  17 g Oral BID  . senna-docusate  2 tablet Oral BID  . vitamin B-12  1,000 mcg Oral Daily   Continuous Infusions: . sodium chloride    . sodium chloride       LOS: 9 days    Time spent: 30 min    Lea Baine, MD [facility] Pager [phone]  If 7PM-7AM, please contact night-coverage [email or web address] Password [email or web address] 05/19/2016, 6:10 PM

## 2016-05-19 NOTE — Evaluation (Signed)
Occupational Therapy Evaluation Patient Details Name: Thomas [name] MRN: [id number] DOB: [birth year] Today's Date: 05/19/2016    History of Present Illness 70 year old male with no significant medical history (had not seen a physician in many years) presented with about 1 month of nausea with daily vomiting. Also reports poor by mouth intake and subjective weight loss. Denied any diarrhea, hematemesis or bleeding per rectum. Patient went to the urgent care and was found to have a hemoglobin of 9.8 and sent to the ED. Lab work showing hyperproteinemia, abnormal lucency of lumbar vertebrae and multiple radiolucencies in the skull x-ray indicated towards multiple myeloma; now s/p IM fixation of Impending pathologic fracture, left femur with bone biopsy; WBAT; further imaging showing L3 compression fx and L5 lesion; Neurosurgery consulted, not surgical candidate at this time; they did not order a brace   Clinical Impression   Pt with decline in function and safety with ADLs and ADL mobility with decreased strength, balance and endurance. Pt reports L LE pain hindering him from mobility. Pt anxious to be able to start walking. Pt very motivated and planning to d/c to CIR or SNF for further therapy before returning home. OT will continue to follow    Follow Up Recommendations  CIR(SW making plans for SNF?)    Equipment Recommendations  Other (comment) (TBD at next venue of care)    Recommendations for Other Services       Precautions / Restrictions Precautions Precautions: Fall Restrictions Weight Bearing Restrictions: Yes LLE Weight Bearing: Weight bearing as tolerated      Mobility Bed Mobility Overal bed mobility: Needs Assistance Bed Mobility: Supine to Sit     Supine to sit: Mod assist;HOB elevated     General bed mobility comments: mod A with L LE to EOB. Pt uses  trapeze and rails with HOB elevated to raise trunk  Transfers Overall transfer level: Needs assistance Equipment  used: Rolling walker (2 wheeled) Transfers: Sit to/from [facility] Sit to Stand: Mod assist Stand pivot transfers: Min assist            Balance Overall balance assessment: Needs assistance Sitting-balance support: No upper extremity supported;Feet supported Sitting balance-Leahy Scale: Fair     Standing balance support: Bilateral upper extremity supported;During functional activity Standing balance-Leahy Scale: Poor                              ADL Overall ADL's : Needs assistance/impaired     Grooming: Wash/dry hands;Wash/dry face;Sitting;Set up   Upper Body Bathing: Sitting;Minimal assitance   Lower Body Bathing: Maximal assistance   Upper Body Dressing : Minimal assistance;Sitting   Lower Body Dressing: Total assistance   Toilet Transfer: Stand-pivot;RW;Minimal assistance;Moderate assistance   Toileting- Clothing Manipulation and Hygiene: Total assistance       Functional mobility during ADLs: Minimal assistance;Moderate assistance       Vision  no change from baseline              Pertinent Vitals/Pain Pain Assessment: 0-10 Pain Score: 6  Pain Location: L LE Pain Descriptors / Indicators: Guarding;Grimacing;Aching Pain Intervention(s): Limited activity within patient's tolerance;Monitored during session;Premedicated before session;Repositioned     Hand Dominance Right   Extremity/Trunk Assessment Upper Extremity Assessment Upper Extremity Assessment: Generalized weakness   Lower Extremity Assessment Lower Extremity Assessment: Defer to PT evaluation       Communication Communication Communication: No difficulties   Cognition Arousal/Alertness: Awake/alert Behavior During Therapy: [facility] for  tasks assessed/performed;Anxious Overall Cognitive Status: Within Functional Limits for tasks assessed                     General Comments   pt very pleasant and cooperative, motivated. Family suportive                  Home Living Family/patient expects to be discharged to:: Other (Comment) (inpatient rehab vs SNF)                                 Additional Comments: Pt, wife, and daughter expressed understanding that he will need rehabilitation once medically stable      Prior Functioning/Environment Level of Independence: Independent             OT Diagnosis: Generalized weakness;Acute pain   OT Problem List: Pain;Impaired balance (sitting and/or standing);Decreased activity tolerance;Decreased strength;Decreased knowledge of use of DME or AE   OT Treatment/Interventions: Self-care/ADL training;Patient/family education;Therapeutic activities;DME and/or AE instruction;Therapeutic exercise    OT Goals(Current goals can be found in the care plan section) Acute Rehab OT Goals Patient Stated Goal: get better and go home OT Goal Formulation: With patient/family Time For Goal Achievement: 05/26/16 Potential to Achieve Goals: Good ADL Goals Pt Will Perform Upper Body Bathing: with min guard assist;with supervision;sitting Pt Will Perform Lower Body Bathing: with mod assist;sitting/lateral leans Pt Will Perform Upper Body Dressing: with min guard assist;with supervision;sitting Pt Will Transfer to Toilet: with min assist;with min guard assist;bedside commode Additional ADL Goal #1: complete sit - stand with min guard A  for BSC transfer/transition  OT Frequency: Min 2X/week   Barriers to D/C: Decreased caregiver support                        End of Session Equipment Utilized During Treatment: Gait belt;Rolling walker;Oxygen Nurse Communication: Mobility status  Activity Tolerance: Patient tolerated treatment well;Patient limited by pain Patient left: in chair;with call bell/phone within reach;with family/visitor present   Time: 6047-9987 OT Time Calculation (min): 40 min Charges:  OT General Charges $OT Visit: 1 Procedure OT Evaluation $OT Eval Moderate  Complexity: 1 Procedure OT Treatments $Self Care/Home Management : 8-22 mins $Therapeutic Activity: 8-22 mins G-Codes:    Britt Bottom 05/19/2016, 2:22 PM

## 2016-05-20 DIAGNOSIS — R195 Other fecal abnormalities: Secondary | ICD-10-CM

## 2016-05-20 DIAGNOSIS — N2589 Other disorders resulting from impaired renal tubular function: Secondary | ICD-10-CM

## 2016-05-20 LAB — CBC
HCT: 25.3 % — ABNORMAL LOW (ref 39.0–52.0)
HEMATOCRIT: 24.6 % — AB (ref 39.0–52.0)
HEMOGLOBIN: 8.3 g/dL — AB (ref 13.0–17.0)
Hemoglobin: 8.4 g/dL — ABNORMAL LOW (ref 13.0–17.0)
MCH: 29.1 pg (ref 26.0–34.0)
MCH: 28.9 pg (ref 26.0–34.0)
MCHC: 33.2 g/dL (ref 30.0–36.0)
MCHC: 33.7 g/dL (ref 30.0–36.0)
MCV: 87.5 fL (ref 78.0–100.0)
MCV: 85.7 fL (ref 78.0–100.0)
PLATELETS: 107 10*3/uL — AB (ref 150–400)
Platelets: 99 10*3/uL — ABNORMAL LOW (ref 150–400)
RBC: 2.89 MIL/uL — AB (ref 4.22–5.81)
RBC: 2.87 MIL/uL — AB (ref 4.22–5.81)
RDW: 16 % — AB (ref 11.5–15.5)
RDW: 14.8 % (ref 11.5–15.5)
WBC: 5.5 10*3/uL (ref 4.0–10.5)
WBC: 5.5 10*3/uL (ref 4.0–10.5)

## 2016-05-20 LAB — COMPREHENSIVE METABOLIC PANEL
ALBUMIN: 1.9 g/dL — AB (ref 3.5–5.0)
ALK PHOS: 64 U/L (ref 38–126)
ALT: 16 U/L — AB (ref 17–63)
ANION GAP: 10 (ref 5–15)
AST: 26 U/L (ref 15–41)
BILIRUBIN TOTAL: 1.2 mg/dL (ref 0.3–1.2)
BUN: 36 mg/dL — AB (ref 6–20)
CALCIUM: 7.8 mg/dL — AB (ref 8.9–10.3)
CO2: 26 mmol/L (ref 22–32)
CREATININE: 5.51 mg/dL — AB (ref 0.61–1.24)
Chloride: 92 mmol/L — ABNORMAL LOW (ref 101–111)
GFR calc Af Amer: 11 mL/min — ABNORMAL LOW (ref >60)
GFR calc non Af Amer: 10 mL/min — ABNORMAL LOW (ref >60)
GLUCOSE: 182 mg/dL — AB (ref 65–99)
Potassium: 3.5 mmol/L (ref 3.5–5.1)
SODIUM: 128 mmol/L — AB (ref 135–145)
Total Protein: 7.2 g/dL (ref 6.5–8.1)

## 2016-05-20 LAB — URIC ACID: URIC ACID, SERUM: 6.5 mg/dL (ref 4.4–7.6)

## 2016-05-20 MED ORDER — DARBEPOETIN ALFA 100 MCG/0.5ML IJ SOSY
PREFILLED_SYRINGE | INTRAMUSCULAR | Status: AC
  Filled 2016-05-20: qty 0.5

## 2016-05-20 NOTE — Clinical Social Work Note (Addendum)
CSW met with patient and his family to present bed offers.  Patient and his family are requesting [facility], CSW contacted SNF, and they will review patient's record and make decision if they can offer or not.  CSW informed patient's family they need to consider the other options because if [facility] declines they will have to go to a different facility.  CSW left contact information for family to follow up with weekend Education officer, museum.  CSW explained to patient and his family they will have to make a decision because if he is medically ready for discharge, he will have to go to a facility patient and family acknowledged understanding.  CSW was informed that patient is not medically ready for discharge yet.  CSW to continue to follow patient's progress.  Jones Broom. [place], MSW, [place] 05/20/2016 6:40 PM

## 2016-05-20 NOTE — Progress Notes (Signed)
PROGRESS NOTE  Thomas [name]  OVZ:[id number] DOB: 09/12/1946 DOA: 05/09/2016 PCP: No PCP Per Patient  Brief Narrative:   70 year old male with no significant medical history (had not seen a physician in many years) presented with about 1 month of nausea with daily vomiting. Also reports poor by mouth intake and subjective weight loss. Denied any diarrhea, hematemesis or bleeding per rectum. Patient went to the urgent care and was found to have a hemoglobin of 9.8 and sent to the ED. In the ED blood work showed creatinine >10 and BUN of 40. He was taking high dose of NSAIDs about 3 months back for severe low back pain, prescribed by his chiropractor. Reports that he told them for less than 2 weeks.  He was also found to have severe hyperkalemia and given insulin with dextrose. Admitted to hospitalist service for further management. Lab work showed hyperproteinemia, abnormal lucency of lumbar vertebrae and multiple radiolucencies in the skull x-ray indicated towards multiple myeloma.  Oncology was consulted.  He was also found to have pathological fracture of left femur and underwent IM nailing.  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 -SPEP, light chains and IFE consistent with IgG kappa multiple myeloma. -Renal biopsy results Still pending.  - Bone biopsy from left hip surgery:  Sheets of atypical plasma cells - Started hemodialysis 6/2 - CLIP process started - Vein mapping complete. Vascular surgery has seen  regarding need for permanent dialysis access.  Multiple myeloma with diffuse skeletal lesions -Bone survey showing metastatic bone lesions with largest lesion involving left iliac wing. Also lesion involving midshaft of left femur with pathological fracture. L3 and L5 compression fracture. - MRI of the spine shows large lumbar lesion associated with extraosseous extension of tumor into the left paraspinous soft tissue at L5 with moderate to severe canal stenosis. Evaluated by neurosurgery and given no neurological deficit recommends no surgical intervention. Recommend radiation therapy.  - plan for outpatient palliative radiation by Dr. Lisbeth Renshaw - Bone marrow biopsy done on 6/6.  - Dr. Alvy Bimler following - Started dexamethasone and velcade on 6/8.  RN from [facility] to come to [facility] to provide Velcade   Low grade fever, asymptomatic, may be post-operative atelectasis.  Clinically well appearing.  No sepsis. -  CXR:  Possible left lower lobe infiltrate vs. atelectasis -  IS and OOB to chair as soon as possible -  If he becomes sicker, blood cultures and pneumonia order set for aspiration pneumonia -  MRSA PCR negative on 6/4 so would probably not start vancomycin  -  UA pending (nearly anuric)  Pathological fracture of left femur IM nailing with open biopsy of the left femur with intraoperative frozen section done on 6/6.Marland Kitchen Results pending. continue pain control. Outpatient follow-up with orthopedics after discharge-will need outpatient XRT to left femur.  Anemia due to ARF, myeloma, and vitamin b12 deficiency - Multiple transfusions during the course of this hospitalization. Follow CBCs.  Progressive thrombocytopenia. Platelet function assay is high. Avoid  heparin products.  Received 2 u platelets.  Vitamin B12 deficiency (106). - anti-IF - anti-parietal cell - IM supplementation for now  Hyperkalemia, resolved. Now on dialysis.  Malnutrition of moderate degree -  Renal diet with  supplements  Constipation  -  Daily miralax -  Bisacodyl suppository -  Continue senna  DVT prophylaxis:  SCDs Code Status:  full Family Communication:  Discussed with patient and spouse at bedside. Disposition Plan:  Pending arrangements for outpatient HD and SNF placement.  SW consult placed.  CLIP started.As per nephrology service, will not be ready for discharge until sometime next week.    Consultants:  Nephrology (Dr. Eyoel Footman) IR Oncology (Dr. Alvy Bimler) Orthopedics (Dr Delfino Lovett) Neurosurgery Radiation onc ( Dr Tammi Klippel)  Procedures:  Left renal biopsy Bone marrow biopsy on 6/6 IM nailding of the left femur fracture  Antimicrobials:   none   Subjective: Seen at HD this morning. No complaints.   Objective: Filed Vitals:   05/20/16 0830 05/20/16 0900 05/20/16 0930 05/20/16 1000  BP: 122/62 132/62 116/65 127/67  Pulse: 62 60 58 74  Temp:      TempSrc:      Resp: '15 16 15 25  '$ Height:      Weight:      SpO2:        Intake/Output Summary (Last 24 hours) at 05/20/16 1011 Last data filed at 05/20/16 0645  Gross per 24 hour  Intake    240 ml  Output    700 ml  Net   -460 ml   Filed Weights   05/18/16 1215 05/19/16 2123 05/20/16 0725  Weight: 165 lb 2 oz (74.9 kg) 170 lb 4.8 oz (77.248 kg) 169 lb 15.6 oz (77.1 kg)    Examination:  General exam:  Pleasant middle-aged male lying comfortably in bed. Looks cachectic and chronically ill.  HEENT:  NCAT, MMM Respiratory system:  Diminished at the bilateral bases. Rest of lung fields clear to auscultation. No increased work of breathing.  Cardiovascular system: Regular rate and rhythm, normal U7/O5.  2/6 systolic murmur, no gallops or clicks.  Warm extremities Gastrointestinal system: Normal active bowel sounds, soft, nondistended, nontender. MSK:  Normal tone and bulk, 1+ left lower extremity edema.  Neuro:  Grossly intact. Alert and oriented. No focal deficits.    Data Reviewed: I have personally reviewed  following labs and imaging studies  CBC:  Recent Labs Lab 05/15/16 0743 05/16/16 0949 05/17/16 0648 05/18/16 0330 05/20/16 0937  WBC 5.9 7.4 6.5 5.5 5.5  HGB 6.5* 7.3* 6.7* 8.4* 8.3*  HCT 18.2* 21.8* 20.2* 25.3* 24.6*  MCV 84.7 89.7 89.4 87.5 85.7  PLT 95* 93* 111* 107* 99*   Basic Metabolic Panel:  Recent Labs Lab 05/13/16 1223  05/16/16 0949 05/17/16 0648 05/18/16 0330 05/19/16 0604 05/20/16 0740  NA 128*  < > 134* 132* 129* 129* 128*  K 4.4  < > 4.6 3.8 4.0 3.6 3.5  CL 97*  < > 100* 93* 94* 95* 92*  CO2 23  < > '26 28 28 28 26  '$ GLUCOSE 112*  < > 92 77 97 217* 182*  BUN 36*  < > 25* 14 22* 16 36*  CREATININE 9.93*  < > 6.84* 4.49* 5.75* 4.04* 5.51*  CALCIUM 8.1*  < > 7.9* 7.8* 7.9* 7.8* 7.8*  PHOS 5.1*  --   --  4.6  --   --   --   < > = values in this interval not displayed. GFR: Estimated Creatinine  Clearance: 14 mL/min (by C-G formula based on Cr of 5.51). Liver Function Tests:  Recent Labs Lab 05/13/16 1223 05/17/16 0648 05/18/16 0330 05/19/16 0604 05/20/16 0740  AST  --   --  '22 20 26  '$ ALT  --   --  14* 13* 16*  ALKPHOS  --   --  54 55 64  BILITOT  --   --  1.5* 1.2 1.2  PROT  --   --  7.3 7.4 7.2  ALBUMIN 1.9* 1.9* 1.9* 1.8* 1.9*   No results for input(s): LIPASE, AMYLASE in the last 168 hours. No results for input(s): AMMONIA in the last 168 hours. Coagulation Profile: No results for input(s): INR, PROTIME in the last 168 hours. Cardiac Enzymes: No results for input(s): CKTOTAL, CKMB, CKMBINDEX, TROPONINI in the last 168 hours. BNP (last 3 results) No results for input(s): PROBNP in the last 8760 hours. HbA1C: No results for input(s): HGBA1C in the last 72 hours. CBG: No results for input(s): GLUCAP in the last 168 hours. Lipid Profile: No results for input(s): CHOL, HDL, LDLCALC, TRIG, CHOLHDL, LDLDIRECT in the last 72 hours. Thyroid Function Tests: No results for input(s): TSH, T4TOTAL, FREET4, T3FREE, THYROIDAB in the last 72  hours. Anemia Panel: No results for input(s): VITAMINB12, FOLATE, FERRITIN, TIBC, IRON, RETICCTPCT in the last 72 hours. Urine analysis:    Component Value Date/Time   COLORURINE YELLOW 05/10/2016 0812   APPEARANCEUR CLOUDY* 05/10/2016 0812   LABSPEC 1.017 05/10/2016 0812   PHURINE 6.0 05/10/2016 0812   GLUCOSEU 100* 05/10/2016 0812   HGBUR SMALL* 05/10/2016 0812   BILIRUBINUR NEGATIVE 05/10/2016 0812   KETONESUR NEGATIVE 05/10/2016 0812   PROTEINUR 100* 05/10/2016 0812   NITRITE NEGATIVE 05/10/2016 0812   LEUKOCYTESUR MODERATE* 05/10/2016 0812   Sepsis Labs: '@LABRCNTIP'$ (procalcitonin:4,lacticidven:4)  ) Recent Results (from the past 240 hour(s))  Surgical pcr screen     Status: None   Collection Time: 05/14/16  1:00 AM  Result Value Ref Range Status   MRSA, PCR NEGATIVE NEGATIVE Final   Staphylococcus aureus NEGATIVE NEGATIVE Final    Comment:        The Xpert SA Assay (FDA approved for NASAL specimens in patients over 45 years of age), is one component of a comprehensive surveillance program.  Test performance has been validated by [facility] for patients greater than or equal to 32 year old. It is not intended to diagnose infection nor to guide or monitor treatment.   Culture, blood (routine x 2)     Status: None (Preliminary result)   Collection Time: 05/16/16  8:05 PM  Result Value Ref Range Status   Specimen Description BLOOD HEMODIALYSIS CATHETER  Final   Special Requests BOTTLES DRAWN AEROBIC AND ANAEROBIC 10CC  Final   Culture NO GROWTH 3 DAYS  Final   Report Status PENDING  Incomplete  Culture, blood (routine x 2)     Status: None (Preliminary result)   Collection Time: 05/16/16  8:15 PM  Result Value Ref Range Status   Specimen Description BLOOD HEMODIALYSIS CATHETER  Final   Special Requests BOTTLES DRAWN AEROBIC AND ANAEROBIC 10CC  Final   Culture NO GROWTH 3 DAYS  Final   Report Status PENDING  Incomplete      Radiology Studies: No results  found.   Scheduled Meds: . sodium chloride   Intravenous Once  . sodium chloride   Intravenous Once  . sodium chloride   Intravenous Once  . acyclovir  200 mg Oral BID  .  darbepoetin (ARANESP) injection - DIALYSIS  100 mcg Intravenous Q Sat-HD  . dexamethasone  40 mg Oral Weekly  . docusate sodium  100 mg Oral BID  . feeding supplement (NEPRO CARB STEADY)  237 mL Oral BID BM  . multivitamin  1 tablet Oral QHS  . polyethylene glycol  17 g Oral BID  . senna-docusate  2 tablet Oral BID  . vitamin B-12  1,000 mcg Oral Daily   Continuous Infusions: . sodium chloride    . sodium chloride       LOS: 10 days   Time spent: 30 min  Irwin Brakeman, MD [facility] Pager [phone]  If 7PM-7AM, please contact night-coverage [email or web address] Password [email or web address] 05/20/2016, 10:11 AM

## 2016-05-20 NOTE — Procedures (Signed)
Pt seen on HD.  K 3.5 on 4K bath.  Ap 180 vp 120  BFR 400.  CBC P.

## 2016-05-20 NOTE — Progress Notes (Signed)
Subjective: 6 Days Post-Op Procedure(s) (LRB): INTRAMEDULLARY (IM) NAIL FEMORAL (Left) LEFT FEMORAL BIOPSY WITH INTRAOPERATIVE FROZEN SECTIONS  (Left) Patient resting well. receiving dialysis now. No compliants.  Objective: Vital signs in last 24 hours: Temp:  [97.6 F (36.4 C)-98.5 F (36.9 C)] 98.5 F (36.9 C) (06/10 0725) Pulse Rate:  [60-82] 60 (06/10 0900) Resp:  [15-20] 16 (06/10 0900) BP: (122-146)/(46-75) 132/62 mmHg (06/10 0900) SpO2:  [95 %-100 %] 96 % (06/10 0437) Weight:  [77.1 kg (169 lb 15.6 oz)-77.248 kg (170 lb 4.8 oz)] 77.1 kg (169 lb 15.6 oz) (06/10 0725)  Intake/Output from previous day: 06/09 0701 - 06/10 0700 In: 360 [P.O.:360] Out: 700 [Urine:700] Intake/Output this shift:     Recent Labs  05/18/16 0330  HGB 8.4*    Recent Labs  05/18/16 0330  WBC 5.5  RBC 2.89*  HCT 25.3*  PLT 107*    Recent Labs  05/19/16 0604 05/20/16 0740  NA 129* 128*  K 3.6 3.5  CL 95* 92*  CO2 28 26  BUN 16 36*  CREATININE 4.04* 5.51*  GLUCOSE 217* 182*  CALCIUM 7.8* 7.8*   No results for input(s): LABPT, INR in the last 72 hours.   Left  dressing C/D/I. No DVT signs. No signs of infection or compartment syndrome. LLE grossly neurovascularly intact.   Assessment/Plan: 6 Days Post-Op Procedure(s) (LRB): INTRAMEDULLARY (IM) NAIL FEMORAL (Left) LEFT FEMORAL BIOPSY WITH INTRAOPERATIVE FROZEN SECTIONS  (Left) WBAT LLE with walker Maintain spica wrap L hip PT and OT OK for d/c from ortho standpoint Dialysis today  Thomas [name] L 05/20/2016, 9:19 AM

## 2016-05-21 LAB — COMPREHENSIVE METABOLIC PANEL
ALBUMIN: 1.8 g/dL — AB (ref 3.5–5.0)
ALT: 23 U/L (ref 17–63)
ANION GAP: 8 (ref 5–15)
AST: 27 U/L (ref 15–41)
Alkaline Phosphatase: 72 U/L (ref 38–126)
BILIRUBIN TOTAL: 1.2 mg/dL (ref 0.3–1.2)
BUN: 22 mg/dL — ABNORMAL HIGH (ref 6–20)
CO2: 27 mmol/L (ref 22–32)
Calcium: 7.8 mg/dL — ABNORMAL LOW (ref 8.9–10.3)
Chloride: 96 mmol/L — ABNORMAL LOW (ref 101–111)
Creatinine, Ser: 3.74 mg/dL — ABNORMAL HIGH (ref 0.61–1.24)
GFR calc Af Amer: 18 mL/min — ABNORMAL LOW (ref >60)
GFR, EST NON AFRICAN AMERICAN: 15 mL/min — AB (ref >60)
GLUCOSE: 131 mg/dL — AB (ref 65–99)
POTASSIUM: 3.4 mmol/L — AB (ref 3.5–5.1)
SODIUM: 131 mmol/L — AB (ref 135–145)
TOTAL PROTEIN: 7.2 g/dL (ref 6.5–8.1)

## 2016-05-21 LAB — URINALYSIS, ROUTINE W REFLEX MICROSCOPIC
BILIRUBIN URINE: NEGATIVE
GLUCOSE, UA: NEGATIVE mg/dL
Ketones, ur: NEGATIVE mg/dL
Leukocytes, UA: NEGATIVE
Nitrite: NEGATIVE
PH: 7.5 (ref 5.0–8.0)
Protein, ur: 100 mg/dL — AB
SPECIFIC GRAVITY, URINE: 1.008 (ref 1.005–1.030)

## 2016-05-21 LAB — CULTURE, BLOOD (ROUTINE X 2)
Culture: NO GROWTH
Culture: NO GROWTH

## 2016-05-21 LAB — URINE MICROSCOPIC-ADD ON

## 2016-05-21 LAB — URIC ACID: URIC ACID, SERUM: 3.8 mg/dL — AB (ref 4.4–7.6)

## 2016-05-21 NOTE — Progress Notes (Addendum)
Patient refusing to turn at this time.  Patient did adjust weight by using the over head trapeze bar.  Patient encouraged to turn every 2 hours. Patient is also refusing bed alarm at this time.

## 2016-05-21 NOTE — Progress Notes (Signed)
Physical Therapy Treatment Patient Details Name: Thomas [name] MRN: [id number] DOB: 15-Nov-1946 Today's Date: 05/21/2016    History of Present Illness 69 year old male with no significant medical history (had not seen a physician in many years) presented with about 1 month of nausea with daily vomiting. Also reports poor by mouth intake and subjective weight loss. Denied any diarrhea, hematemesis or bleeding per rectum. Patient went to the urgent care and was found to have a hemoglobin of 9.8 and sent to the ED. Lab work showing hyperproteinemia, abnormal lucency of lumbar vertebrae and multiple radiolucencies in the skull x-ray indicated towards multiple myeloma; now s/p IM fixation of Impending pathologic fracture, left femur with bone biopsy; WBAT; further imaging showing L3 compression fx and L5 lesion; Neurosurgery consulted, not surgical candidate at this time; they did not order a brace    PT Comments    D/C recommendations updated. Pt is a good CIR candidate. He is motivated to participate in therapy and return to independent status. Pt is currently undergoing HD TTS.  Follow Up Recommendations  CIR     Equipment Recommendations  Rolling walker with 5" wheels;3in1 (PT);Wheelchair (measurements PT);Wheelchair cushion (measurements PT)    Recommendations for Other Services Rehab consult     Precautions / Restrictions Precautions Precautions: Fall Restrictions LLE Weight Bearing: Weight bearing as tolerated    Mobility  Bed Mobility         Supine to sit: Mod assist     General bed mobility comments: assist with LLE and to raise trunk  Transfers   Equipment used: Rolling walker (2 wheeled)   Sit to Stand: Min assist         General transfer comment: verbal cues for hand placement, assist to power up and stabilize initial standing balance  Ambulation/Gait Ambulation/Gait assistance: Min assist Ambulation Distance (Feet): 5 Feet Assistive device: Rolling walker (2  wheeled) Gait Pattern/deviations: Step-to pattern;Antalgic;Decreased stride length Gait velocity: decreased Gait velocity interpretation: Below normal speed for age/gender General Gait Details: verbal cues fo sequencing   Stairs            Wheelchair Mobility    Modified Rankin (Stroke Patients Only)       Balance   Sitting-balance support: No upper extremity supported;Feet supported Sitting balance-Leahy Scale: Good Sitting balance - Comments: Pt able to maintain EOB sitting balance.   Standing balance support: During functional activity;Bilateral upper extremity supported Standing balance-Leahy Scale: Fair Standing balance comment: Increased time required to stabilize initial standing balance.                    Cognition Arousal/Alertness: Awake/alert Behavior During Therapy: WFL for tasks assessed/performed Overall Cognitive Status: Within Functional Limits for tasks assessed                      Exercises Total Joint Exercises Ankle Circles/Pumps: AROM;Both;10 reps Heel Slides: AAROM;Left;5 reps    General Comments        Pertinent Vitals/Pain Pain Score: 6  Pain Location: LLE during mobility Pain Descriptors / Indicators: Guarding;Grimacing;Sore Pain Intervention(s): Monitored during session;Limited activity within patient's tolerance;Repositioned    Home Living                      Prior Function            PT Goals (current goals can now be found in the care plan section) Acute Rehab PT Goals Patient Stated Goal: get better and go home  PT Goal Formulation: With patient Time For Goal Achievement: 05/31/16 Potential to Achieve Goals: Good Progress towards PT goals: Progressing toward goals    Frequency  Min 3X/week    PT Plan Discharge plan needs to be updated    Co-evaluation             End of Session Equipment Utilized During Treatment: Gait belt Activity Tolerance: Patient tolerated treatment  well Patient left: in chair;with family/visitor present;with call bell/phone within reach;with chair alarm set     Time: 1039-1105 PT Time Calculation (min) (ACUTE ONLY): 26 min  Charges:  $Gait Training: 8-22 mins $Therapeutic Activity: 8-22 mins                    G Codes:      Lorriane Shire 05/21/2016, 12:34 PM

## 2016-05-21 NOTE — Progress Notes (Signed)
S:  Eating good.   O:BP 134/60 mmHg  Pulse 80  Temp(Src) 98.7 F (37.1 C) (Oral)  Resp 18  Ht 6\' 1"  (1.854 m)  Wt 75.1 kg (165 lb 9.1 oz)  BMI 21.85 kg/m2  SpO2 96%  Intake/Output Summary (Last 24 hours) at 05/21/16 1009 Last data filed at 05/20/16 1841  Gross per 24 hour  Intake    477 ml  Output   2003 ml  Net  -1526 ml   Weight change: -0.148 kg (-5.2 oz) [id number] and alert CVS: RRR Resp: clear Abd:+ BS NTND Ext:No edema   Decreased ROM Lt leg NEURO:CNI Ox3 no asterixis Rt IJ PC   . sodium chloride   Intravenous Once  . sodium chloride   Intravenous Once  . sodium chloride   Intravenous Once  . acyclovir  200 mg Oral BID  . darbepoetin (ARANESP) injection - DIALYSIS  100 mcg Intravenous Q Sat-HD  . dexamethasone  40 mg Oral Weekly  . docusate sodium  100 mg Oral BID  . feeding supplement (NEPRO CARB STEADY)  237 mL Oral BID BM  . multivitamin  1 tablet Oral QHS  . polyethylene glycol  17 g Oral BID  . senna-docusate  2 tablet Oral BID  . vitamin B-12  1,000 mcg Oral Daily   No results found. BMET    Component Value Date/Time   NA 131* 05/21/2016 0507   K 3.4* 05/21/2016 0507   CL 96* 05/21/2016 0507   CO2 27 05/21/2016 0507   GLUCOSE 131* 05/21/2016 0507   BUN 22* 05/21/2016 0507   CREATININE 3.74* 05/21/2016 0507   CALCIUM 7.8* 05/21/2016 0507   GFRNONAA 15* 05/21/2016 0507   GFRAA 18* 05/21/2016 0507   CBC    Component Value Date/Time   WBC 5.5 05/20/2016 0937   RBC 2.87* 05/20/2016 0937   HGB 8.3* 05/20/2016 0937   HCT 24.6* 05/20/2016 0937   HCT 22.8* 05/10/2016 1156   PLT 99* 05/20/2016 0937   MCV 85.7 05/20/2016 0937   MCH 28.9 05/20/2016 0937   MCHC 33.7 05/20/2016 0937   RDW 14.8 05/20/2016 0937   LYMPHSABS 1.1 05/09/2016 2144   MONOABS 0.6 05/09/2016 2144   EOSABS 0.1 05/09/2016 2144   BASOSABS 0.0 05/09/2016 2144     Assessment: 1. 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  Will put on TTS schedule 2. MM on Velcade 3. Comp fx  Lt femur SP  intramedullary fixation 4. L5 mass  Plan for radiation 5. Anemia on aranesp Plan:  1. Will put on TTS schedule.  Has been seen by [facility] with plan for access this week, ? Wed 2. Work with PT   Coriana Angello T

## 2016-05-21 NOTE — Progress Notes (Signed)
PROGRESS NOTE  Thomas [name]  OVZ:[id number] DOB: 09/12/1946 DOA: 05/09/2016 PCP: No PCP Per Patient  Brief Narrative:   70 year old male with no significant medical history (had not seen a physician in many years) presented with about 1 month of nausea with daily vomiting. Also reports poor by mouth intake and subjective weight loss. Denied any diarrhea, hematemesis or bleeding per rectum. Patient went to the urgent care and was found to have a hemoglobin of 9.8 and sent to the ED. In the ED blood work showed creatinine >10 and BUN of 40. He was taking high dose of NSAIDs about 3 months back for severe low back pain, prescribed by his chiropractor. Reports that he told them for less than 2 weeks.  He was also found to have severe hyperkalemia and given insulin with dextrose. Admitted to hospitalist service for further management. Lab work showed hyperproteinemia, abnormal lucency of lumbar vertebrae and multiple radiolucencies in the skull x-ray indicated towards multiple myeloma.  Oncology was consulted.  He was also found to have pathological fracture of left femur and underwent IM nailing.  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 -SPEP, light chains and IFE consistent with IgG kappa multiple myeloma. -Renal biopsy results Still pending.  - Bone biopsy from left hip surgery:  Sheets of atypical plasma cells - Started hemodialysis 6/2 - CLIP process started - Vein mapping complete. Vascular surgery has seen  regarding need for permanent dialysis access.  Multiple myeloma with diffuse skeletal lesions -Bone survey showing metastatic bone lesions with largest lesion involving left iliac wing. Also lesion involving midshaft of left femur with pathological fracture. L3 and L5 compression fracture. - MRI of the spine shows large lumbar lesion associated with extraosseous extension of tumor into the left paraspinous soft tissue at L5 with moderate to severe canal stenosis. Evaluated by neurosurgery and given no neurological deficit recommends no surgical intervention. Recommend radiation therapy.  - plan for outpatient palliative radiation by Dr. Lisbeth Renshaw - Bone marrow biopsy done on 6/6.  - Dr. Alvy Bimler following - Started dexamethasone and velcade on 6/8.  RN from [facility] to come to [facility] to provide Velcade   Low grade fever, asymptomatic, may be post-operative atelectasis.  Clinically well appearing.  No sepsis. -  CXR:  Possible left lower lobe infiltrate vs. atelectasis -  IS and OOB to chair as soon as possible -  If he becomes sicker, blood cultures and pneumonia order set for aspiration pneumonia -  MRSA PCR negative on 6/4 so would probably not start vancomycin  -  UA pending (nearly anuric)  Pathological fracture of left femur IM nailing with open biopsy of the left femur with intraoperative frozen section done on 6/6.Marland Kitchen Results pending. continue pain control. Outpatient follow-up with orthopedics after discharge-will need outpatient XRT to left femur.  Anemia due to ARF, myeloma, and vitamin b12 deficiency - Multiple transfusions during the course of this hospitalization. Follow CBCs.  Progressive thrombocytopenia. Platelet function assay is high. Avoid  heparin products.  Received 2 u platelets.  Vitamin B12 deficiency (106). - anti-IF - anti-parietal cell - IM supplementation for now  Hyperkalemia, resolved. Now on dialysis.  Malnutrition of moderate degree -  Renal diet with  supplements  Constipation  -  Daily miralax -  Bisacodyl suppository -  Continue senna  DVT prophylaxis:  SCDs Code Status:  full Family Communication:  Discussed with patient and spouse at bedside. Disposition Plan:  Pending arrangements for outpatient HD and SNF placement.  SW consult placed.  CLIP started.As per nephrology service, will not be ready for discharge until sometime next week.   Consultants:  Nephrology (Dr. Cornelio Footman) IR Oncology (Dr. Alvy Bimler) Orthopedics (Dr Delfino Lovett) Neurosurgery Radiation onc ( Dr Tammi Klippel)  Procedures:  Left renal biopsy Bone marrow biopsy on 6/6 IM nailding of the left femur fracture  Antimicrobials:   none   Subjective: Pt says he was tired after HD yesterday but feeling better today. No complaints.   Objective: Filed Vitals:   05/20/16 1840 05/20/16 2140 05/21/16 0500 05/21/16 0839  BP: 134/63 130/63 139/67 134/60  Pulse: 79 81 83 80  Temp: 98.2 F (36.8 C) 98.1 F (36.7 C) 98.2 F (36.8 C) 98.7 F (37.1 C)  TempSrc: Oral  Oral Oral  Resp: _0 Height:      Weight:      SpO2: 97% 95% 99% 96%    Intake/Output Summary (Last 24 hours) at 05/21/16 1023 Last data filed at 05/20/16 1841  Gross per 24 hour  Intake    477 ml  Output   2003 ml  Net  -1526 ml   Filed Weights   05/19/16 2123 05/20/16 0725 05/20/16 1125  Weight: 170 lb 4.8 oz (77.248 kg) 169 lb 15.6 oz (77.1 kg) 165 lb 9.1 oz (75.1 kg)    Examination:  General exam:  Pleasant middle-aged male lying comfortably in bed. Looks cachectic and chronically ill.  HEENT:  NCAT, MMM Respiratory system:  Diminished at the bilateral bases. Rest of lung fields clear to auscultation. No increased work of breathing.  Cardiovascular system: Regular rate and rhythm, normal Z6/X0.  2/6 systolic murmur, no gallops or clicks.  Warm extremities Gastrointestinal system: Normal active bowel sounds, soft, nondistended, nontender. MSK:  Normal tone and bulk, 1+ left lower  extremity edema.  Neuro:  Grossly intact. Alert and oriented. No focal deficits.    Data Reviewed: I have personally reviewed following labs and imaging studies  CBC:  Recent Labs Lab 05/15/16 0743 05/16/16 0949 05/17/16 0648 05/18/16 0330 05/20/16 0937  WBC 5.9 7.4 6.5 5.5 5.5  HGB 6.5* 7.3* 6.7* 8.4* 8.3*  HCT 18.2* 21.8* 20.2* 25.3* 24.6*  MCV 84.7 89.7 89.4 87.5 85.7  PLT 95* 93* 111* 107* 99*   Basic Metabolic Panel:  Recent Labs Lab 05/17/16 0648 05/18/16 0330 05/19/16 0604 05/20/16 0740 05/21/16 0507  NA 132* 129* 129* 128* 131*  K 3.8 4.0 3.6 3.5 3.4*  CL 93* 94* 95* 92* 96*  CO2 _1 GLUCOSE 77 97 217* 182* 131*  BUN 14 22* 16 36* 22*  CREATININE 4.49* 5.75* 4.04* 5.51* 3.74*  CALCIUM 7.8* 7.9* 7.8* 7.8* 7.8*  PHOS 4.6  --   --   --   --    GFR: Estimated Creatinine Clearance: 20.1 mL/min (by C-G formula based on Cr of 3.74). Liver Function Tests:  Recent Labs Lab 05/17/16 0648 05/18/16 0330 05/19/16 0604 05/20/16 0740 05/21/16 0507  AST  --  _0 ALT  --  14* 13* 16* 23  ALKPHOS  --  54 55 64 72  BILITOT  --  1.5* 1.2 1.2 1.2  PROT  --  7.3 7.4 7.2 7.2  ALBUMIN 1.9* 1.9* 1.8* 1.9* 1.8*   No results for input(s): LIPASE, AMYLASE in the last 168 hours. No results for input(s): AMMONIA in the last 168 hours. Coagulation Profile: No results for input(s): INR, PROTIME in the last 168 hours. Cardiac Enzymes: No results for input(s): CKTOTAL, CKMB, CKMBINDEX, TROPONINI in the last 168 hours. BNP (last 3 results) No results for input(s): PROBNP in the last 8760 hours. HbA1C: No results for input(s): HGBA1C in the last 72 hours. CBG: No results for input(s): GLUCAP in the last 168 hours. Lipid Profile: No results for input(s): CHOL, HDL, LDLCALC, TRIG, CHOLHDL, LDLDIRECT in the last 72 hours. Thyroid Function Tests: No results for input(s): TSH, T4TOTAL, FREET4, T3FREE, THYROIDAB in the last 72 hours. Anemia Panel: No  results for input(s): VITAMINB12, FOLATE, FERRITIN, TIBC, IRON, RETICCTPCT in the last 72 hours. Urine analysis:    Component Value Date/Time   COLORURINE YELLOW 05/10/2016 0812   APPEARANCEUR CLOUDY* 05/10/2016 0812   LABSPEC 1.017 05/10/2016 0812   PHURINE 6.0 05/10/2016 0812   GLUCOSEU 100* 05/10/2016 0812   HGBUR SMALL* 05/10/2016 0812   BILIRUBINUR NEGATIVE 05/10/2016 0812   KETONESUR NEGATIVE 05/10/2016 0812   PROTEINUR 100* 05/10/2016 0812   NITRITE NEGATIVE 05/10/2016 0812   LEUKOCYTESUR MODERATE* 05/10/2016 0812   Sepsis Labs: _1 (procalcitonin:4,lacticidven:4)  ) Recent Results (from the past 240 hour(s))  Surgical pcr screen     Status: None   Collection Time: 05/14/16  1:00 AM  Result Value Ref Range Status   MRSA, PCR NEGATIVE NEGATIVE Final   Staphylococcus aureus NEGATIVE NEGATIVE Final    Comment:        The Xpert SA Assay (FDA approved for NASAL specimens in patients over 46 years of age), is one component of a comprehensive surveillance program.  Test performance has been validated by [facility] for patients greater than or equal to 74 year old. It is not intended to diagnose infection nor to guide or monitor treatment.   Culture, blood (routine x 2)     Status: None (Preliminary result)   Collection Time: 05/16/16  8:05 PM  Result Value Ref Range Status   Specimen Description BLOOD HEMODIALYSIS CATHETER  Final   Special Requests BOTTLES DRAWN AEROBIC AND ANAEROBIC 10CC  Final   Culture NO GROWTH 4 DAYS  Final   Report Status PENDING  Incomplete  Culture, blood (routine x 2)     Status: None (Preliminary result)   Collection Time: 05/16/16  8:15 PM  Result Value Ref Range Status   Specimen Description BLOOD HEMODIALYSIS CATHETER  Final   Special Requests BOTTLES DRAWN AEROBIC AND ANAEROBIC 10CC  Final   Culture NO GROWTH 4 DAYS  Final   Report Status PENDING  Incomplete    Radiology Studies: No results found.   Scheduled Meds: .  sodium chloride   Intravenous Once  . sodium chloride   Intravenous Once  . sodium chloride   Intravenous Once  . acyclovir  200 mg Oral BID  . darbepoetin (ARANESP) injection - DIALYSIS  100 mcg Intravenous Q Sat-HD  . dexamethasone  40 mg Oral Weekly  . docusate sodium  100 mg Oral BID  . feeding supplement (NEPRO CARB STEADY)  237 mL Oral BID BM  . multivitamin  1 tablet Oral QHS  . polyethylene glycol  17 g Oral BID  . senna-docusate  2 tablet Oral BID  . vitamin B-12  1,000 mcg Oral Daily   Continuous Infusions: . sodium chloride    . sodium chloride       LOS: 11 days   Time spent: 30 min  Irwin Brakeman, MD [facility] Pager [phone]  If 7PM-7AM, please contact night-coverage [email or web address] Password [facility] 05/21/2016, 10:23 AM

## 2016-05-22 DIAGNOSIS — M84552D Pathological fracture in neoplastic disease, left femur, subsequent encounter for fracture with routine healing: Secondary | ICD-10-CM

## 2016-05-22 DIAGNOSIS — M545 Low back pain, unspecified: Secondary | ICD-10-CM | POA: Diagnosis present

## 2016-05-22 DIAGNOSIS — R05 Cough: Secondary | ICD-10-CM

## 2016-05-22 DIAGNOSIS — E43 Unspecified severe protein-calorie malnutrition: Secondary | ICD-10-CM

## 2016-05-22 DIAGNOSIS — N390 Urinary tract infection, site not specified: Secondary | ICD-10-CM | POA: Diagnosis present

## 2016-05-22 DIAGNOSIS — N178 Other acute kidney failure: Secondary | ICD-10-CM

## 2016-05-22 DIAGNOSIS — Z992 Dependence on renal dialysis: Secondary | ICD-10-CM

## 2016-05-22 DIAGNOSIS — I10 Essential (primary) hypertension: Secondary | ICD-10-CM | POA: Diagnosis present

## 2016-05-22 DIAGNOSIS — D619 Aplastic anemia, unspecified: Secondary | ICD-10-CM

## 2016-05-22 DIAGNOSIS — Z419 Encounter for procedure for purposes other than remedying health state, unspecified: Secondary | ICD-10-CM

## 2016-05-22 DIAGNOSIS — E440 Moderate protein-calorie malnutrition: Secondary | ICD-10-CM

## 2016-05-22 DIAGNOSIS — G8918 Other acute postprocedural pain: Secondary | ICD-10-CM

## 2016-05-22 DIAGNOSIS — N186 End stage renal disease: Secondary | ICD-10-CM

## 2016-05-22 DIAGNOSIS — D62 Acute posthemorrhagic anemia: Secondary | ICD-10-CM

## 2016-05-22 LAB — COMPREHENSIVE METABOLIC PANEL
ALK PHOS: 72 U/L (ref 38–126)
ALT: 20 U/L (ref 17–63)
AST: 22 U/L (ref 15–41)
Albumin: 1.9 g/dL — ABNORMAL LOW (ref 3.5–5.0)
Anion gap: 8 (ref 5–15)
BILIRUBIN TOTAL: 1.2 mg/dL (ref 0.3–1.2)
BUN: 32 mg/dL — AB (ref 6–20)
CALCIUM: 7.9 mg/dL — AB (ref 8.9–10.3)
CO2: 26 mmol/L (ref 22–32)
CREATININE: 5.05 mg/dL — AB (ref 0.61–1.24)
Chloride: 96 mmol/L — ABNORMAL LOW (ref 101–111)
GFR calc Af Amer: 12 mL/min — ABNORMAL LOW (ref >60)
GFR, EST NON AFRICAN AMERICAN: 11 mL/min — AB (ref >60)
Glucose, Bld: 111 mg/dL — ABNORMAL HIGH (ref 65–99)
POTASSIUM: 3.5 mmol/L (ref 3.5–5.1)
Sodium: 130 mmol/L — ABNORMAL LOW (ref 135–145)
TOTAL PROTEIN: 7 g/dL (ref 6.5–8.1)

## 2016-05-22 LAB — URIC ACID: URIC ACID, SERUM: 5.3 mg/dL (ref 4.4–7.6)

## 2016-05-22 MED ORDER — GUAIFENESIN-DM 100-10 MG/5ML PO SYRP
5.0000 mL | ORAL_SOLUTION | ORAL | Status: DC | PRN
  Administered 2016-05-22 – 2016-05-28 (×6): 5 mL via ORAL
  Filled 2016-05-22 (×6): qty 5

## 2016-05-22 MED ORDER — PROCHLORPERAZINE MALEATE 10 MG PO TABS
10.0000 mg | ORAL_TABLET | Freq: Once | ORAL | Status: AC
  Administered 2016-05-22: 10 mg via ORAL
  Filled 2016-05-22: qty 1

## 2016-05-22 MED ORDER — BORTEZOMIB CHEMO SQ INJECTION 3.5 MG (2.5MG/ML)
1.3000 mg/m2 | Freq: Once | INTRAMUSCULAR | Status: AC
  Administered 2016-05-22: 2.5 mg via SUBCUTANEOUS
  Filled 2016-05-22: qty 2.5

## 2016-05-22 NOTE — Progress Notes (Signed)
PROGRESS NOTE  Thomas [name]  QIH:[id number] DOB: 1946-08-22 DOA: 05/09/2016 PCP: No PCP Per Patient  Brief Narrative:   70 year old male with no significant medical history (had not seen a physician in many years) presented with about 1 month of nausea with daily vomiting. Also reports poor by mouth intake and subjective weight loss. Denied any diarrhea, hematemesis or bleeding per rectum. Patient went to the urgent care and was found to have a hemoglobin of 9.8 and sent to the ED. In the ED blood work showed creatinine >10 and BUN of 40. He was taking high dose of NSAIDs about 3 months back for severe low back pain, prescribed by his chiropractor. Reports that he told them for less than 2 weeks.  He was also found to have severe hyperkalemia and given insulin with dextrose. Admitted to hospitalist service for further management. Lab work showed hyperproteinemia, abnormal lucency of lumbar vertebrae and multiple radiolucencies in the skull x-ray indicated towards multiple myeloma.  Oncology was consulted.  He was also found to have pathological fracture of left femur and underwent IM nailing.   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 -SPEP, light chains and IFE consistent with IgG kappa multiple myeloma. -Renal biopsy results Still pending.  - Bone biopsy from left hip surgery:  Sheets of atypical plasma cells - Started hemodialysis 6/2 - CLIP process started - Vein mapping complete. Vascular surgery has seen  regarding need for permanent dialysis access.  Multiple myeloma with diffuse skeletal lesions -Bone survey showing metastatic bone lesions with largest lesion involving left iliac wing. Also lesion involving midshaft of left femur with pathological fracture. L3 and L5 compression fracture. - MRI of the spine shows large lumbar lesion associated with extraosseous extension of tumor into the left paraspinous soft tissue at L5 with moderate to severe canal stenosis. Evaluated by neurosurgery and given no neurological deficit recommends no surgical intervention. Recommend radiation therapy.  - plan for outpatient palliative radiation by Dr. Lisbeth Renshaw - Bone marrow biopsy done on 6/6.  - Dr. Alvy Bimler following - Started dexamethasone and velcade on 6/8.  RN from [facility] to come to [facility] to provide Velcade   Low grade fever, asymptomatic, may be post-operative atelectasis.  Clinically well appearing.  No sepsis. -  CXR:  Possible left lower lobe infiltrate vs. atelectasis -  IS and OOB to chair as soon as possible -  If he becomes sicker, blood cultures and pneumonia order set for aspiration pneumonia -  MRSA PCR negative on 6/4 so would probably not start vancomycin  -  UA shows a lot of RBCs.    Pathological fracture of left femur IM nailing with open biopsy of the left femur with intraoperative frozen section done on 6/6.Marland Kitchen Results pending. continue pain control. Outpatient follow-up with orthopedics after discharge-will need outpatient XRT to left femur. -Pt is a good candidate for rehab and is motivated, will consult CIR to evaluate.   Anemia due to ARF, myeloma, and vitamin b12  deficiency - Multiple transfusions during the course of this hospitalization. Follow CBCs.  Progressive thrombocytopenia. Platelet function assay is high. Avoid heparin products.  Received 2 u platelets.  Vitamin B12 deficiency (106). - anti-IF - anti-parietal cell - IM supplementation for now  Hyperkalemia, resolved.  Now on dialysis.  Malnutrition of moderate degree -  Renal diet with supplements  Constipation  -  Daily miralax -  Bisacodyl suppository -  Continue senna  DVT prophylaxis:  SCDs Code Status:  full Family Communication:  Discussed with patient and spouse at bedside. Disposition Plan:  Possible CIR.   SW consult placed.  CLIP started.As per nephrology service, will not be ready for discharge until sometime next week.   Consultants:  Nephrology (Dr. Riese Footman) IR Oncology (Dr. Alvy Bimler) Orthopedics (Dr Delfino Lovett) Neurosurgery Radiation onc ( Dr Tammi Klippel)  Procedures:  Left renal biopsy Bone marrow biopsy on 6/6 IM nailding of the left femur fracture  Antimicrobials:   none   Subjective: Pt says he has a dry hacking cough.  No shortness of breath or chest pain.    Objective: Filed Vitals:   05/21/16 0839 05/21/16 1816 05/21/16 2024 05/22/16 0500  BP: 134/60 143/60 134/55 142/64  Pulse: 80 78 73 77  Temp: 98.7 F (37.1 C) 98.7 F (37.1 C) 98.1 F (36.7 C) 98.1 F (36.7 C)  TempSrc: Oral Oral Axillary Oral  Resp: 18 16 16 18   Height:      Weight:   177 lb 4 oz (80.4 kg)   SpO2: 96% 99% 97% 96%    Intake/Output Summary (Last 24 hours) at 05/22/16 0819 Last data filed at 05/21/16 2200  Gross per 24 hour  Intake    960 ml  Output    700 ml  Net    260 ml   Filed Weights   05/20/16 0725 05/20/16 1125 05/21/16 2024  Weight: 169 lb 15.6 oz (77.1 kg) 165 lb 9.1 oz (75.1 kg) 177 lb 4 oz (80.4 kg)    Examination:  General exam:  Pleasant middle-aged male lying comfortably in bed. Looks cachectic and chronically ill.  HEENT:  NCAT, MMM Respiratory system:  Diminished at the bilateral bases. Rest of lung fields clear to auscultation. No increased work of breathing.  Cardiovascular system: Regular rate and rhythm, normal X4/I0.  2/6 systolic murmur, no gallops or clicks.  Warm extremities Gastrointestinal system: Normal active bowel sounds, soft, nondistended,  nontender. MSK:  Normal tone and bulk, 1+ left lower extremity edema.  Neuro:  Grossly intact. Alert and oriented. No focal deficits.    Data Reviewed: I have personally reviewed following labs and imaging studies  CBC:  Recent Labs Lab 05/16/16 0949 05/17/16 0648 05/18/16 0330 05/20/16 0937  WBC 7.4 6.5 5.5 5.5  HGB 7.3* 6.7* 8.4* 8.3*  HCT 21.8* 20.2* 25.3* 24.6*  MCV 89.7 89.4 87.5 85.7  PLT 93* 111* 107* 99*   Basic Metabolic Panel:  Recent Labs Lab 05/17/16 0648 05/18/16 0330 05/19/16 0604 05/20/16 0740 05/21/16 0507 05/22/16 0446  NA 132* 129* 129* 128* 131* 130*  K 3.8 4.0 3.6 3.5 3.4* 3.5  CL 93* 94* 95* 92* 96* 96*  CO2 28 28 28 26 27 26   GLUCOSE 77 97 217* 182* 131* 111*  BUN 14 22* 16 36* 22* 32*  CREATININE 4.49* 5.75* 4.04* 5.51* 3.74* 5.05*  CALCIUM 7.8* 7.9* 7.8* 7.8* 7.8* 7.9*  PHOS 4.6  --   --   --   --   --    GFR:  Estimated Creatinine Clearance: 15.8 mL/min (by C-G formula based on Cr of 5.05). Liver Function Tests:  Recent Labs Lab 05/18/16 0330 05/19/16 0604 05/20/16 0740 05/21/16 0507 05/22/16 0446  AST 22 20 26 27 22   ALT 14* 13* 16* 23 20  ALKPHOS 54 55 64 72 72  BILITOT 1.5* 1.2 1.2 1.2 1.2  PROT 7.3 7.4 7.2 7.2 7.0  ALBUMIN 1.9* 1.8* 1.9* 1.8* 1.9*   No results for input(s): LIPASE, AMYLASE in the last 168 hours. No results for input(s): AMMONIA in the last 168 hours. Coagulation Profile: No results for input(s): INR, PROTIME in the last 168 hours. Cardiac Enzymes: No results for input(s): CKTOTAL, CKMB, CKMBINDEX, TROPONINI in the last 168 hours. BNP (last 3 results) No results for input(s): PROBNP in the last 8760 hours. HbA1C: No results for input(s): HGBA1C in the last 72 hours. CBG: No results for input(s): GLUCAP in the last 168 hours. Lipid Profile: No results for input(s): CHOL, HDL, LDLCALC, TRIG, CHOLHDL, LDLDIRECT in the last 72 hours. Thyroid Function Tests: No results for input(s): TSH, T4TOTAL, FREET4,  T3FREE, THYROIDAB in the last 72 hours. Anemia Panel: No results for input(s): VITAMINB12, FOLATE, FERRITIN, TIBC, IRON, RETICCTPCT in the last 72 hours. Urine analysis:    Component Value Date/Time   COLORURINE YELLOW 05/21/2016 1531   APPEARANCEUR CLOUDY* 05/21/2016 1531   LABSPEC 1.008 05/21/2016 1531   PHURINE 7.5 05/21/2016 1531   GLUCOSEU NEGATIVE 05/21/2016 1531   HGBUR LARGE* 05/21/2016 1531   BILIRUBINUR NEGATIVE 05/21/2016 1531   KETONESUR NEGATIVE 05/21/2016 1531   PROTEINUR 100* 05/21/2016 1531   NITRITE NEGATIVE 05/21/2016 1531   LEUKOCYTESUR NEGATIVE 05/21/2016 1531   Sepsis Labs: @LABRCNTIP (procalcitonin:4,lacticidven:4)  ) Recent Results (from the past 240 hour(s))  Surgical pcr screen     Status: None   Collection Time: 05/14/16  1:00 AM  Result Value Ref Range Status   MRSA, PCR NEGATIVE NEGATIVE Final   Staphylococcus aureus NEGATIVE NEGATIVE Final    Comment:        The Xpert SA Assay (FDA approved for NASAL specimens in patients over 51 years of age), is one component of a comprehensive surveillance program.  Test performance has been validated by [facility] for patients greater than or equal to 66 year old. It is not intended to diagnose infection nor to guide or monitor treatment.   Culture, blood (routine x 2)     Status: None   Collection Time: 05/16/16  8:05 PM  Result Value Ref Range Status   Specimen Description BLOOD HEMODIALYSIS CATHETER  Final   Special Requests BOTTLES DRAWN AEROBIC AND ANAEROBIC 10CC  Final   Culture NO GROWTH 5 DAYS  Final   Report Status 05/21/2016 FINAL  Final  Culture, blood (routine x 2)     Status: None   Collection Time: 05/16/16  8:15 PM  Result Value Ref Range Status   Specimen Description BLOOD HEMODIALYSIS CATHETER  Final   Special Requests BOTTLES DRAWN AEROBIC AND ANAEROBIC 10CC  Final   Culture NO GROWTH 5 DAYS  Final   Report Status 05/21/2016 FINAL  Final    Radiology Studies: No results  found.  Scheduled Meds: . sodium chloride   Intravenous Once  . sodium chloride   Intravenous Once  . sodium chloride   Intravenous Once  . acyclovir  200 mg Oral BID  . darbepoetin (ARANESP) injection - DIALYSIS  100 mcg Intravenous Q Sat-HD  . dexamethasone  40 mg Oral Weekly  . docusate  sodium  100 mg Oral BID  . feeding supplement (NEPRO CARB STEADY)  237 mL Oral BID BM  . multivitamin  1 tablet Oral QHS  . polyethylene glycol  17 g Oral BID  . senna-docusate  2 tablet Oral BID  . vitamin B-12  1,000 mcg Oral Daily   Continuous Infusions: . sodium chloride    . sodium chloride       LOS: 12 days   Time spent: 30 min  Irwin Brakeman, MD [facility] Pager [phone]  If 7PM-7AM, please contact night-coverage [email or web address] Password [facility] 05/22/2016, 8:19 AM

## 2016-05-22 NOTE — Consult Note (Signed)
Physical Medicine and Rehabilitation Consult Reason for Consult: left femur pathologic fracture/multiple myeloma Referring Physician: [facility]   HPI: Thomas [name] is a 70 y.o. right handed male with chronic back pain admitted 05/10/2016 with nausea, back pain and has been taking very high doses of NSAIDS recently. Per chart review patient lives with his wife was independent until March with progressive back pain. One level home with a ramp. Wife can assist. Recently saw a chiropractor for his back pain and was receiving narcotics. Findings of elevated creatinine 10.50, hypokalemia 6.7. Renal ultrasound negative for uropathy. Renal services was consulted with hemodialysis initiated. Vascular surgery consulted for access. Latest creatinine 3.74. Workup for back pain with bone scan showing multiple bone lesions suggestive of metastatic bone disease suspicious for multiple myeloma. Largest lesion left iliac wing. There was also a lesion involving left eighth rib lucent lesion involving L5 left pedicle and questionable lesion L1 pedicle. Suspicious lucency midshaft left femur with cortical thinning as well as L3-L5 compression fracture. Bone marrow biopsy completed 05/16/2016. Oncology as well as radiation oncology consulted and presently on Decadron therapy. He underwent intramedullary fixation of left femur per Dr. Lyla Glassing 05/15/2016 for pathologic fracture left femur. Patient is weightbearing as tolerated left lower extremity. Hospital course pain management. Acute on chronic anemia hemoglobin 8.3.   Review of Systems  Constitutional: Negative for fever and chills.  HENT: Negative for hearing loss.   Eyes: Negative for blurred vision and double vision.  Respiratory: Positive for cough. Negative for shortness of breath.   Cardiovascular: Negative for chest pain, palpitations and leg swelling.  Gastrointestinal: Positive for nausea and constipation.  Genitourinary: Positive for urgency. Negative  for dysuria and hematuria.  Musculoskeletal: Positive for myalgias and back pain.  Skin: Negative for rash.  Neurological: Positive for weakness. Negative for seizures and headaches.  All other systems reviewed and are negative.  Past Medical History  Diagnosis Date  . Compression fracture    Past Surgical History  Procedure Laterality Date  . Mouth surgery    . Femur im nail Left 05/14/2016    Procedure: INTRAMEDULLARY (IM) NAIL FEMORAL;  Surgeon: Rod Can, MD;  Location: [place];  Service: Orthopedics;  Laterality: Left;  . Bone biopsy Left 05/14/2016    Procedure: LEFT FEMORAL BIOPSY WITH INTRAOPERATIVE FROZEN SECTIONS ;  Surgeon: Rod Can, MD;  Location: [place];  Service: Orthopedics;  Laterality: Left;   Family History  Problem Relation Age of Onset  . Diabetes Father    Social History:  reports that he has quit smoking. He does not have any smokeless tobacco history on file. He reports that he does not drink alcohol or use illicit drugs. Allergies:  Allergies  Allergen Reactions  . Dilaudid [Hydromorphone Hcl] Other (See Comments)    hallucinations  . Codeine Other (See Comments)    "MAKES ME JUMPY"  . Penicillins     Has patient had a PCN reaction causing immediate rash, facial/tongue/throat swelling, SOB or lightheadedness with hypotension: Yes Has patient had a PCN reaction causing severe rash involving mucus membranes or skin necrosis: Yes Has patient had a PCN reaction that required hospitalization: No Has patient had a PCN reaction occurring within the last 10 years: No If all of the above answers are "NO", then may proceed with Cephalosporin use.    No prescriptions prior to admission    Home: Home Living Family/patient expects to be discharged to:: Other (Comment) (inpatient rehab vs SNF) Additional Comments: Pt, wife, and  daughter expressed understanding that he will need rehabilitation once medically stable  Functional History: Prior Function Level  of Independence: Independent Functional Status:  Mobility: Bed Mobility Overal bed mobility: Needs Assistance Bed Mobility: Supine to Sit Supine to sit: Mod assist General bed mobility comments: assist with LLE and to raise trunk Transfers Overall transfer level: Needs assistance Equipment used: Rolling walker (2 wheeled) Transfers: Sit to/from Stand, Stand Pivot Transfers Sit to Stand: Min assist Stand pivot transfers: Min assist General transfer comment: verbal cues for hand placement, assist to power up and stabilize initial standing balance Ambulation/Gait Ambulation/Gait assistance: Min assist Ambulation Distance (Feet): 5 Feet Assistive device: Rolling walker (2 wheeled) Gait Pattern/deviations: Step-to pattern, Antalgic, Decreased stride length General Gait Details: verbal cues fo sequencing Gait velocity: decreased Gait velocity interpretation: Below normal speed for age/gender    ADL: ADL Overall ADL's : Needs assistance/impaired Grooming: Wash/dry hands, Wash/dry face, Sitting, Set up Upper Body Bathing: Sitting, Minimal assitance Lower Body Bathing: Maximal assistance Upper Body Dressing : Minimal assistance, Sitting Lower Body Dressing: Total assistance Toilet Transfer: Stand-pivot, RW, Minimal assistance, Moderate assistance Toileting- Clothing Manipulation and Hygiene: Total assistance Functional mobility during ADLs: Minimal assistance, Moderate assistance  Cognition: Cognition Overall Cognitive Status: Within Functional Limits for tasks assessed Orientation Level: Oriented X4 Cognition Arousal/Alertness: Awake/alert Behavior During Therapy: WFL for tasks assessed/performed Overall Cognitive Status: Within Functional Limits for tasks assessed  Blood pressure 142/64, pulse 77, temperature 98.1 F (36.7 C), temperature source Oral, resp. rate 18, height 6' 1"  (1.854 m), weight 80.4 kg (177 lb 4 oz), SpO2 96 %. Physical Exam  Vitals  reviewed. Constitutional: He is oriented to person, place, and time. He appears well-developed. No distress.  HENT:  Head: Normocephalic.  Right Ear: External ear normal.  Left Ear: External ear normal.  Eyes: Conjunctivae and EOM are normal.  Neck: Normal range of motion. Neck supple. No thyromegaly present.  Cardiovascular: Normal rate and regular rhythm.   Respiratory: Effort normal and breath sounds normal. No respiratory distress.  GI: Soft. Bowel sounds are normal. He exhibits no distension.  Musculoskeletal: He exhibits edema and tenderness.  Neurological: He is alert and oriented to person, place, and time.  Motor: Bilateral upper extremities: 4/5 proximal to distal Right lower extremity 4 -/5 proximal to distal Left lower extremity: Hip flexion, knee extension 2/5, ankle dorsi/plantar flexion 2+/5 Sensation intact light touch  Skin: Skin is warm and dry.  Dressings C/D/I  Psychiatric: He has a normal mood and affect. His behavior is normal.    Results for orders placed or performed during the hospital encounter of 05/09/16 (from the past 24 hour(s))  Urinalysis, Routine w reflex microscopic (not at [facility])     Status: Abnormal   Collection Time: 05/21/16  3:31 PM  Result Value Ref Range   Color, Urine YELLOW YELLOW   APPearance CLOUDY (A) CLEAR   Specific Gravity, Urine 1.008 1.005 - 1.030   pH 7.5 5.0 - 8.0   Glucose, UA NEGATIVE NEGATIVE mg/dL   Hgb urine dipstick LARGE (A) NEGATIVE   Bilirubin Urine NEGATIVE NEGATIVE   Ketones, ur NEGATIVE NEGATIVE mg/dL   Protein, ur 100 (A) NEGATIVE mg/dL   Nitrite NEGATIVE NEGATIVE   Leukocytes, UA NEGATIVE NEGATIVE  Urine microscopic-add on     Status: Abnormal   Collection Time: 05/21/16  3:31 PM  Result Value Ref Range   Squamous Epithelial / LPF 0-5 (A) NONE SEEN   WBC, UA 0-5 0 - 5 WBC/hpf   RBC /  HPF 6-30 0 - 5 RBC/hpf   Bacteria, UA MANY (A) NONE SEEN  Comprehensive metabolic panel     Status: Abnormal   Collection  Time: 05/22/16  4:46 AM  Result Value Ref Range   Sodium 130 (L) 135 - 145 mmol/L   Potassium 3.5 3.5 - 5.1 mmol/L   Chloride 96 (L) 101 - 111 mmol/L   CO2 26 22 - 32 mmol/L   Glucose, Bld 111 (H) 65 - 99 mg/dL   BUN 32 (H) 6 - 20 mg/dL   Creatinine, Ser 5.05 (H) 0.61 - 1.24 mg/dL   Calcium 7.9 (L) 8.9 - 10.3 mg/dL   Total Protein 7.0 6.5 - 8.1 g/dL   Albumin 1.9 (L) 3.5 - 5.0 g/dL   AST 22 15 - 41 U/L   ALT 20 17 - 63 U/L   Alkaline Phosphatase 72 38 - 126 U/L   Total Bilirubin 1.2 0.3 - 1.2 mg/dL   GFR calc non Af Amer 11 (L) >60 mL/min   GFR calc Af Amer 12 (L) >60 mL/min   Anion gap 8 5 - 15  Uric acid     Status: None   Collection Time: 05/22/16  4:46 AM  Result Value Ref Range   Uric Acid, Serum 5.3 4.4 - 7.6 mg/dL   No results found.  Assessment/Plan: Diagnosis: pathologic fracture left femur Labs and images independently reviewed.  Records reviewed and summated above.  1. Does the need for close, 24 hr/day medical supervision in concert with the patient's rehab needs make it unreasonable for this patient to be served in a less intensive setting? Yes 2. Co-Morbidities requiring supervision/potential complications: Acute on chronic anemia (transfuse if necessary to ensure appropriate perfusion for increased activity tolerance), chronic back and post-op pain management (Biofeedback training with therapies to help reduce reliance on opiate pain medications, monitor pain control during therapies, and sedation at rest and titrate to maximum efficacy to ensure participation and gains in therapies), multiple myeloma (cont recs per Onc), ESRD (newly diagnosed, continue recs per nephro), HTN (monitor and provide prns in accordance with increased physical exertion and pain), ?UTI (cont to follow culture results) 3. Due to safety, skin/wound care, disease management, medication administration, pain management and patient education, does the patient require 24 hr/day rehab nursing?  Yes 4. Does the patient require coordinated care of a physician, rehab nurse, PT (1-2 hrs/day, 5 days/week) and OT (1-2 hrs/day, 5 days/week) to address physical and functional deficits in the context of the above medical diagnosis(es)? Yes Addressing deficits in the following areas: balance, endurance, locomotion, strength, transferring, bathing, dressing, toileting and psychosocial support 5. Can the patient actively participate in an intensive therapy program of at least 3 hrs of therapy per day at least 5 days per week? Yes 6. The potential for patient to make measurable gains while on inpatient rehab is excellent 7. Anticipated functional outcomes upon discharge from inpatient rehab are modified independent and supervision  with PT, modified independent with OT, n/a with SLP. 8. Estimated rehab length of stay to reach the above functional goals is: 10-15 days. 9. Does the patient have adequate social supports and living environment to accommodate these discharge functional goals? Yes 10. Anticipated D/C setting: Home 11. Anticipated post D/C treatments: HH therapy and Home excercise program 12. Overall Rehab/Functional Prognosis: good and fair  RECOMMENDATIONS: This patient's condition is appropriate for continued rehabilitative care in the following setting: CIR Patient has agreed to participate in recommended program. Yes Note that insurance  prior authorization may be required for reimbursement for recommended care.  Comment: Rehab Admissions Coordinator to follow up.  Delice Lesch, MD 05/22/2016

## 2016-05-22 NOTE — Progress Notes (Signed)
[facility] ROUNDING NOTE   Subjective:   Interval History:  No complaints although looks very week  Objective:  Vital signs in last 24 hours:  Temp:  [98.1 F (36.7 C)-98.7 F (37.1 C)] 98.6 F (37 C) (06/12 1007) Pulse Rate:  [73-80] 80 (06/12 1007) Resp:  [16-18] 16 (06/12 1007) BP: (134-158)/(55-69) 158/69 mmHg (06/12 1007) SpO2:  [96 %-99 %] 98 % (06/12 1007) Weight:  [80.4 kg (177 lb 4 oz)] 80.4 kg (177 lb 4 oz) (06/11 2024)  Weight change: 3.3 kg (7 lb 4.4 oz) Filed Weights   05/20/16 0725 05/20/16 1125 05/21/16 2024  Weight: 77.1 kg (169 lb 15.6 oz) 75.1 kg (165 lb 9.1 oz) 80.4 kg (177 lb 4 oz)    Intake/Output: I/O last 3 completed shifts: In: 960 [P.O.:960] Out: 700 [Urine:700]   Intake/Output this shift:  Total I/O In: 120 [P.O.:120] Out: 0   [id number] and alert CVS: RRR Resp: clear Abd:+ BS NTND Ext:No edema Decreased ROM Lt leg NEURO:CNI Ox3 no asterixis Rt IJ PC   Basic Metabolic Panel:  Recent Labs Lab 05/17/16 0648 05/18/16 0330 05/19/16 0604 05/20/16 0740 05/21/16 0507 05/22/16 0446  NA 132* 129* 129* 128* 131* 130*  K 3.8 4.0 3.6 3.5 3.4* 3.5  CL 93* 94* 95* 92* 96* 96*  CO2 28 28 28 26 27 26   GLUCOSE 77 97 217* 182* 131* 111*  BUN 14 22* 16 36* 22* 32*  CREATININE 4.49* 5.75* 4.04* 5.51* 3.74* 5.05*  CALCIUM 7.8* 7.9* 7.8* 7.8* 7.8* 7.9*  PHOS 4.6  --   --   --   --   --     Liver Function Tests:  Recent Labs Lab 05/18/16 0330 05/19/16 0604 05/20/16 0740 05/21/16 0507 05/22/16 0446  AST 22 20 26 27 22   ALT 14* 13* 16* 23 20  ALKPHOS 54 55 64 72 72  BILITOT 1.5* 1.2 1.2 1.2 1.2  PROT 7.3 7.4 7.2 7.2 7.0  ALBUMIN 1.9* 1.8* 1.9* 1.8* 1.9*   No results for input(s): LIPASE, AMYLASE in the last 168 hours. No results for input(s): AMMONIA in the last 168 hours.  CBC:  Recent Labs Lab 05/16/16 0949 05/17/16 0648 05/18/16 0330 05/20/16 0937  WBC 7.4 6.5 5.5 5.5  HGB 7.3* 6.7* 8.4* 8.3*  HCT 21.8*  20.2* 25.3* 24.6*  MCV 89.7 89.4 87.5 85.7  PLT 93* 111* 107* 99*    Cardiac Enzymes: No results for input(s): CKTOTAL, CKMB, CKMBINDEX, TROPONINI in the last 168 hours.  BNP: Invalid input(s): POCBNP  CBG: No results for input(s): GLUCAP in the last 168 hours.  Microbiology: Results for orders placed or performed during the hospital encounter of 05/09/16  Surgical pcr screen     Status: None   Collection Time: 05/14/16  1:00 AM  Result Value Ref Range Status   MRSA, PCR NEGATIVE NEGATIVE Final   Staphylococcus aureus NEGATIVE NEGATIVE Final    Comment:        The Xpert SA Assay (FDA approved for NASAL specimens in patients over 68 years of age), is one component of a comprehensive surveillance program.  Test performance has been validated by [facility] for patients greater than or equal to 50 year old. It is not intended to diagnose infection nor to guide or monitor treatment.   Culture, blood (routine x 2)     Status: None   Collection Time: 05/16/16  8:05 PM  Result Value Ref Range Status   Specimen Description BLOOD HEMODIALYSIS  CATHETER  Final   Special Requests BOTTLES DRAWN AEROBIC AND ANAEROBIC 10CC  Final   Culture NO GROWTH 5 DAYS  Final   Report Status 05/21/2016 FINAL  Final  Culture, blood (routine x 2)     Status: None   Collection Time: 05/16/16  8:15 PM  Result Value Ref Range Status   Specimen Description BLOOD HEMODIALYSIS CATHETER  Final   Special Requests BOTTLES DRAWN AEROBIC AND ANAEROBIC 10CC  Final   Culture NO GROWTH 5 DAYS  Final   Report Status 05/21/2016 FINAL  Final    Coagulation Studies: No results for input(s): LABPROT, INR in the last 72 hours.  Urinalysis:  Recent Labs  05/21/16 1531  COLORURINE YELLOW  LABSPEC 1.008  PHURINE 7.5  GLUCOSEU NEGATIVE  HGBUR LARGE*  BILIRUBINUR NEGATIVE  KETONESUR NEGATIVE  PROTEINUR 100*  NITRITE NEGATIVE  LEUKOCYTESUR NEGATIVE      Imaging: No results found.   Medications:    . sodium chloride    . sodium chloride     . sodium chloride   Intravenous Once  . sodium chloride   Intravenous Once  . sodium chloride   Intravenous Once  . acyclovir  200 mg Oral BID  . bortezomib SQ  1.3 mg/m2 (Treatment Plan Actual) Subcutaneous Once  . darbepoetin (ARANESP) injection - DIALYSIS  100 mcg Intravenous Q Sat-HD  . dexamethasone  40 mg Oral Weekly  . docusate sodium  100 mg Oral BID  . feeding supplement (NEPRO CARB STEADY)  237 mL Oral BID BM  . multivitamin  1 tablet Oral QHS  . polyethylene glycol  17 g Oral BID  . prochlorperazine  10 mg Oral Once  . senna-docusate  2 tablet Oral BID  . vitamin B-12  1,000 mcg Oral Daily   acetaminophen **OR** acetaminophen, alum & mag hydroxide-simeth, bisacodyl, guaiFENesin-dextromethorphan, HYDROcodone-acetaminophen, menthol-cetylpyridinium **OR** phenol, methocarbamol, metoCLOPramide **OR** metoCLOPramide (REGLAN) injection, ondansetron **OR** ondansetron (ZOFRAN) IV  Assessment/ Plan:  1. 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 Will put on TTS schedule  Having [facility]VS evaluate for permanent access 2. MM on Velcade 3. Comp fx Lt femur SP intramedullary fixation 4. L5 mass Plan for radiation 5. Anemia on aranesp   LOS: 12 Sha Burling W @TODAY @11 :04 AM

## 2016-05-22 NOTE — Progress Notes (Signed)
Occupational Therapy Treatment Patient Details Name: Thomas [name] MRN: [id number] DOB: 05/03/1946 Today's Date: 05/22/2016    History of present illness 70 year old male with no significant medical history (had not seen a physician in many years) presented with about 1 month of nausea with daily vomiting. Also reports poor by mouth intake and subjective weight loss. Denied any diarrhea, hematemesis or bleeding per rectum. Patient went to the urgent care and was found to have a hemoglobin of 9.8 and sent to the ED. Lab work showing hyperproteinemia, abnormal lucency of lumbar vertebrae and multiple radiolucencies in the skull x-ray indicated towards multiple myeloma; now s/p IM fixation of Impending pathologic fracture, left femur with bone biopsy; WBAT; further imaging showing L3 compression fx and L5 lesion; Neurosurgery consulted, not surgical candidate at this time; they did not order a brace   OT comments  Pt fatigued after having been in chair for several hours and through lunch. Improvement in mobility. Began educating pt in compensatory strategies for bathing and dressing L LE. Will continue to follow.  Follow Up Recommendations  CIR    Equipment Recommendations       Recommendations for Other Services      Precautions / Restrictions Precautions Precautions: Fall Restrictions LLE Weight Bearing: Weight bearing as tolerated       Mobility Bed Mobility Overal bed mobility: Needs Assistance Bed Mobility: Rolling;Sit to Sidelying Rolling: Supervision       Sit to sidelying: Min assist General bed mobility comments: encouraged log roll technique to protect back, assist for LEs  Transfers   Equipment used: Rolling walker (2 wheeled) Transfers: Sit to/from [facility] Sit to Stand: Min assist Stand pivot transfers: Min assist       General transfer comment: verbal cues for hand placement, assist to power up and stabilize initial standing balance     Balance     Sitting balance-Leahy Scale: Good       Standing balance-Leahy Scale: Fair                     ADL Overall ADL's : Needs assistance/impaired     Grooming: Wash/dry hands;Sitting;Set up           Upper Body Dressing : Minimal assistance;Sitting Upper Body Dressing Details (indicate cue type and reason): doffed front opening gown                   General ADL Comments: Pt fatigued having been up in chair since this morning, limited session.      Vision                     Perception     Praxis      Cognition   Behavior During Therapy: WFL for tasks assessed/performed Overall Cognitive Status: Within Functional Limits for tasks assessed                       Extremity/Trunk Assessment               Exercises     Shoulder Instructions       General Comments      Pertinent Vitals/ Pain       Pain Assessment: Faces Faces Pain Scale: Hurts even more Pain Location: L LE  Pain Descriptors / Indicators: Grimacing;Guarding;Cramping Pain Intervention(s): Monitored during session;Repositioned  Home Living  Prior Functioning/Environment              Frequency Min 2X/week     Progress Toward Goals  OT Goals(current goals can now be found in the care plan section)  Progress towards OT goals: Progressing toward goals  Acute Rehab OT Goals Patient Stated Goal: get better and go home Time For Goal Achievement: 05/26/16 Potential to Achieve Goals: Good  Plan Discharge plan remains appropriate    Co-evaluation                 End of Session     Activity Tolerance Patient limited by fatigue   Patient Left in bed;with family/visitor present;with call bell/phone within reach   Nurse Communication          Time: 2542-7062 OT Time Calculation (min): 15 min  Charges: OT General Charges $OT Visit: 1 Procedure OT  Treatments $Therapeutic Activity: 8-22 mins  Malka So 05/22/2016, 3:52 PM  [phone]

## 2016-05-22 NOTE — Progress Notes (Signed)
Thomas [name]   DOB:29-Dec-1945   IH#:[id number]    Subjective: I saw him this morning. His wife was present. He is tolerating dialysis well apart from progressive fatigue with each dialysis episode. Yesterday, he had a good day and was able to get out of bed and sat on the chair for 4 hours. He was able to mobilize short distances. He denies worsening back pain. Constipation had resolved. He denies nausea. He tolerated the first dose Velcade well. He had mild rash but is not causing any trouble. He denies new neurological deficit Noted the plan for possible vascular surgery to create fistula for possible long-term hemodialysis. According to his wife, he was making around 700 mL of urine for the last 2 days He complained of nonproductive cough but denies fever or chills.   Objective:  Filed Vitals:   05/21/16 2024 05/22/16 0500  BP: 134/55 142/64  Pulse: 73 77  Temp: 98.1 F (36.7 C) 98.1 F (36.7 C)  Resp: 16 18     Intake/Output Summary (Last 24 hours) at 05/22/16 0838 Last data filed at 05/21/16 2200  Gross per 24 hour  Intake    960 ml  Output    700 ml  Net    260 ml    GENERAL:alert, no distress and comfortable SKIN: skin color, texture, turgor are normal, no rashes or significant lesions. Mild skin rash had prior injection site without signs of cellulitis EYES: normal, Conjunctiva are pale and non-injected, sclera clear OROPHARYNX:no exudate, no erythema and lips, buccal mucosa, and tongue normal  NECK: supple, thyroid normal size, non-tender, without nodularity LYMPH:  no palpable lymphadenopathy in the cervical, axillary or inguinal LUNGS: Mild left basilarcrackles but with normal breathing effort. No wheezes HEART: regular rate & rhythm and no murmurs and no lower extremity edema ABDOMEN:abdomen soft, non-tender and normal bowel sounds Musculoskeletal:no cyanosis of digits and no clubbing  NEURO: alert & oriented x 3 with fluent speech, no focal motor/sensory  deficits   Labs:  Lab Results  Component Value Date   WBC 5.5 05/20/2016   HGB 8.3* 05/20/2016   HCT 24.6* 05/20/2016   MCV 85.7 05/20/2016   PLT 99* 05/20/2016   NEUTROABS 4.4 05/09/2016    Lab Results  Component Value Date   NA 130* 05/22/2016   K 3.5 05/22/2016   CL 96* 05/22/2016   CO2 26 05/22/2016    Assessment & Plan:   IgG kappa multiple myeloma I had long discussion with the patient and family members regarding 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 The patient presented with significant bone lesions, renal failure and pancytopenia. Further testing from cytogenetics and FISH anaylsis from recent bone marrow biopsy is pending but biopsy of his bone from the femur confirmed diagnosis of multiple myeloma. He was started on weekly pulse dose dexamethasone 40 mg on 05/18/16. He was started on Velcade subcutaneous injections on days 1,4,8,11 on 05/18/16. I tried to avoid weeked injection and hence his day 4 treatment is on for today.  I will get chemotherapy certified nursing staff from [place] to proceed with Velcade injection at [facility] So far he tolerated treatment well apart from very mild skin rash which is expected. 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 There is no contraindication  for him to receive dexamethasone or Velcade while on hemodialysis as they are not renally cleared Hopefully, his hemodialysis treatment can be transitioned to outpatient treatment I agree with the plan for fistula creation as directed by vascular surgery  Significant bone lesions throughout He had prophylactic surgery to the left femur. MRI shows significant lesion in the lumbar spine. He was seen by radiation oncologist with plan for radiation  therapy in a palliative fashion to the femur and the lumbar spine His pain is currently well controlled with current pain medication regimen.  Severe pancytopenia, vitamin B-12 deficiency Multifactorial, likely due to multiple myeloma and severe vitamin B-12 deficiency and renal failure I agreed for him to receive blood transfusion to keep hemoglobin greater than 7 I have added one injection of vitamin B 12 on 05/18/16. He will continue high-dose supplement daily by mouth  Severe constipation, resolved Multifactorial likely due to dehydration and medication side effects This has resolved with aggressive laxative therapy  Severe vitamin D deficiency I started him on high-dose replacement therapy  Severe protein calorie malnutrition Continue dietitian consult and frequent small meals  Cough This is nonproductive. He has no evidence of recent leukocytosis. Chest x-ray confirmed atelectasis. Recommend incentive spirometry and out of bed as much as possible  Discharge planning Unknown. Apparently, there is plan for possible inpatient rehabilitation.  I will return on Wednesday afternoon or Thursday morning to check on him before day 8 treatment Hopefully, we can transition his future treatment to outpatient  [facility], Lorenza Winkleman, MD 05/22/2016  8:38 AM

## 2016-05-22 NOTE — Progress Notes (Signed)
Physical Therapy Treatment Patient Details Name: Thomas [name] MRN: [id number] DOB: Jul 19, 1946 Today's Date: 05/22/2016    History of Present Illness 70 year old male with no significant medical history (had not seen a physician in many years) presented with about 1 month of nausea with daily vomiting. Also reports poor by mouth intake and subjective weight loss. Denied any diarrhea, hematemesis or bleeding per rectum. Patient went to the urgent care and was found to have a hemoglobin of 9.8 and sent to the ED. Lab work showing hyperproteinemia, abnormal lucency of lumbar vertebrae and multiple radiolucencies in the skull x-ray indicated towards multiple myeloma; now s/p IM fixation of Impending pathologic fracture, left femur with bone biopsy; WBAT; further imaging showing L3 compression fx and L5 lesion; Neurosurgery consulted, not surgical candidate at this time; they did not order a brace    PT Comments    Mr. Seavey is very motivated to get up and moving and back to moving independently; he has excellent family support at home, and is showing improvements in functional mobility and activity tolerance with each PT session so far; I agree that he is a good candidate for CIR.  Follow Up Recommendations  CIR     Equipment Recommendations  Rolling walker with 5" wheels;3in1 (PT);Wheelchair (measurements PT);Wheelchair cushion (measurements PT)    Recommendations for Other Services Rehab consult     Precautions / Restrictions Precautions Precautions: Fall Restrictions Weight Bearing Restrictions: Yes LLE Weight Bearing: Weight bearing as tolerated    Mobility  Bed Mobility Overal bed mobility: Needs Assistance Bed Mobility: Supine to Sit     Supine to sit: Mod assist     General bed mobility comments: assist with LLE and to raise trunk; took time to problem-solve for strategies to help Mr. Trejos to be more independent, including pullin up on belt looped around foot of bed (on his  right) to pull to upright sitting  Transfers Overall transfer level: Needs assistance Equipment used: Rolling walker (2 wheeled) Transfers: Sit to/from Stand Sit to Stand: Min assist         General transfer comment: verbal cues for hand placement, assist to power up and stabilize initial standing balance  Ambulation/Gait Ambulation/Gait assistance: Min assist;+2 safety/equipment Ambulation Distance (Feet): 35 Feet Assistive device: Rolling walker (2 wheeled) Gait Pattern/deviations: Step-to pattern;Antalgic Gait velocity: decreased Gait velocity interpretation: Below normal speed for age/gender General Gait Details: Cues for gait sequence and to bear down into RW to take pressure off of LLE instance with the hopes of increasing amb tolerance   Stairs            Wheelchair Mobility    Modified Rankin (Stroke Patients Only)       Balance     Sitting balance-Leahy Scale: Good Sitting balance - Comments: Pt able to maintain EOB sitting balance. Much improved from initial eval last week     Standing balance-Leahy Scale: Fair                      Cognition Arousal/Alertness: Awake/alert Behavior During Therapy: WFL for tasks assessed/performed Overall Cognitive Status: Within Functional Limits for tasks assessed                      Exercises      General Comments        Pertinent Vitals/Pain Pain Assessment: Faces Faces Pain Scale: Hurts even more Pain Location: LLE during mobility; when I asked pt for a number, he stated "  not bad", but noted undeniable grimace Pain Descriptors / Indicators: Aching;Grimacing    Home Living                      Prior Function            PT Goals (current goals can now be found in the care plan section) Acute Rehab PT Goals Patient Stated Goal: get better and go home PT Goal Formulation: With patient Time For Goal Achievement: 05/31/16 Potential to Achieve Goals: Good Progress towards PT  goals: Progressing toward goals (Will likely need to update goals next session)    Frequency  Min 3X/week    PT Plan Current plan remains appropriate    Co-evaluation             End of Session Equipment Utilized During Treatment: Gait belt Activity Tolerance: Patient tolerated treatment well Patient left: in chair;with call bell/phone within reach;with family/visitor present     Time: 3383-2919 PT Time Calculation (min) (ACUTE ONLY): 22 min  Charges:  $Gait Training: 8-22 mins                    G Codes:      Quin Hoop 05/22/2016, 9:54 AM  Roney Marion, [place] Pager [phone] Office [phone]

## 2016-05-22 NOTE — Progress Notes (Signed)
Patient complain of dry, hacking cough.  MD notified.

## 2016-05-22 NOTE — Progress Notes (Signed)
   05/22/16 1554  Clinical Encounter Type  Visited With Patient and family together  Visit Type Follow-up  Referral From Nurse  Spiritual Encounters  Spiritual Needs Prayer  Stress Factors  Patient Stress Factors Health changes  Family Stress Factors Health changes  Chaplain follow up visit made, provided ministry of presence, and prayer.

## 2016-05-22 NOTE — Progress Notes (Signed)
Inpatient Rehab consult is pending today and Karene Fry will follow up. She can be reached at [phone].

## 2016-05-22 NOTE — Care Management Important Message (Signed)
Important Message  Patient Details  Name: Thomas [name] MRN: [id number] Date of Birth: [birth year]   Medicare Important Message Given:  Yes    [name] 05/22/2016, 1:57 PM

## 2016-05-22 NOTE — Clinical Social Work Note (Signed)
Call received from [facility] admissions director, Nira Conn regarding patient and his dialysis schedule. Requested to know in family will be able to transport patient to dialysis and CSW. Conversation with wife at the bedside regarding [facility] and dialysis transportation and she will transport with help from family (daughter and grandson). Mrs. Amorin is hopeful that inpatient rehab will accept patient. CSW will continue to follow and continue to provide support and discharge planning assistance as needed to patient/family.  Antwoin Lackey Givens, MSW, LCSW Licensed Clinical Social Worker [place] [phone]

## 2016-05-23 LAB — RENAL FUNCTION PANEL
ANION GAP: 10 (ref 5–15)
Albumin: 1.8 g/dL — ABNORMAL LOW (ref 3.5–5.0)
BUN: 42 mg/dL — AB (ref 6–20)
CHLORIDE: 94 mmol/L — AB (ref 101–111)
CO2: 25 mmol/L (ref 22–32)
CREATININE: 6.38 mg/dL — AB (ref 0.61–1.24)
Calcium: 8.3 mg/dL — ABNORMAL LOW (ref 8.9–10.3)
GFR calc non Af Amer: 8 mL/min — ABNORMAL LOW (ref >60)
GFR, EST AFRICAN AMERICAN: 9 mL/min — AB (ref >60)
GLUCOSE: 110 mg/dL — AB (ref 65–99)
PHOSPHORUS: 4.6 mg/dL (ref 2.5–4.6)
Potassium: 3.6 mmol/L (ref 3.5–5.1)
Sodium: 129 mmol/L — ABNORMAL LOW (ref 135–145)

## 2016-05-23 LAB — URINE CULTURE

## 2016-05-23 LAB — CBC
HCT: 25.6 % — ABNORMAL LOW (ref 39.0–52.0)
Hemoglobin: 8.4 g/dL — ABNORMAL LOW (ref 13.0–17.0)
MCH: 28.5 pg (ref 26.0–34.0)
MCHC: 32.8 g/dL (ref 30.0–36.0)
MCV: 86.8 fL (ref 78.0–100.0)
PLATELETS: 78 10*3/uL — AB (ref 150–400)
RBC: 2.95 MIL/uL — ABNORMAL LOW (ref 4.22–5.81)
RDW: 14.6 % (ref 11.5–15.5)
WBC: 7.5 10*3/uL (ref 4.0–10.5)

## 2016-05-23 MED ORDER — SODIUM CHLORIDE 0.9 % IV SOLN: 100.0000 mL | INTRAVENOUS | Status: DC | PRN

## 2016-05-23 MED ORDER — LIDOCAINE HCL (PF) 1 % IJ SOLN: 5.0000 mL | INTRAMUSCULAR | Status: DC | PRN

## 2016-05-23 MED ORDER — PENTAFLUOROPROP-TETRAFLUOROETH EX AERO: 1.0000 "application " | INHALATION_SPRAY | CUTANEOUS | Status: DC | PRN

## 2016-05-23 MED ORDER — ALTEPLASE 2 MG IJ SOLR: 2.0000 mg | Freq: Once | INTRAMUSCULAR | Status: DC | PRN

## 2016-05-23 MED ORDER — LIDOCAINE-PRILOCAINE 2.5-2.5 % EX CREA: 1.0000 "application " | TOPICAL_CREAM | CUTANEOUS | Status: DC | PRN

## 2016-05-23 MED ORDER — HEPARIN SODIUM (PORCINE) 1000 UNIT/ML DIALYSIS: 1000.0000 [IU] | INTRAMUSCULAR | Status: DC | PRN

## 2016-05-23 NOTE — Procedures (Signed)
I have seen and examined this patient and agree with the plan of care  Patient seen on dialysis   Hb    8.4   Ca   8.3     P  4.6    Alb 1.8   Thomas [name] W 05/23/2016, 12:54 PM

## 2016-05-23 NOTE — Progress Notes (Signed)
Patient requesting to see renal MD.  Dr. Justin Mend notified.

## 2016-05-23 NOTE — Progress Notes (Signed)
Nutrition Follow-up  DOCUMENTATION CODES:   Non-severe (moderate) malnutrition in context of chronic illness  INTERVENTION:  Continue Nepro Shake po BID, each supplement provides 425 kcal and 19 grams protein.  Encourage adequate PO intake.   NUTRITION DIAGNOSIS:   Malnutrition related to chronic illness as evidenced by moderate depletions of muscle mass, moderate depletion of body fat; ongoing  GOAL:   Patient will meet greater than or equal to 90% of their needs; progressing  MONITOR:   PO intake, Supplement acceptance, Weight trends, Labs, I & O's  REASON FOR ASSESSMENT:   Malnutrition Screening Tool    ASSESSMENT:   70 y.o. male with medical history significant of not seeing a physician in many years. Patient has been on very high dose NSAIDS recently due to development of back pain. After his family member found out about this they cut down on the amount of NSAIDs he was taking. However over the past 1 month or so he has had almost daily emesis in the morning. In the ED the patient is found to be in frank renal failure with creatinine >10. Lab work showing hyperproteinemia, abnormal lucency of lumbar vertebrae and multiple radiolucencies in the skull x-ray indicated towards multiple myeloma. Also found to have pathological fracture of left femur and underwent IM nailing. Receiving hemodialysis since 6/2. Pt with ESRD.  Meal completion has been 50-75%. Pt currently has Nepro Shake ordered and has been consuming most of them. RD to continue with current orders. Pt encouraged to eat his food at meals and to drink his supplements.    Labs and medications reviewed.   Diet Order:  Diet renal with fluid restriction Fluid restriction:: 1200 mL Fluid; Room service appropriate?: Yes; Fluid consistency:: Thin Diet NPO time specified Except for: Sips with Meds  Skin:  Reviewed, no issues  Last BM:  6/12  Height:   Ht Readings from Last 1 Encounters:  05/10/16 6' 1"  (1.854 m)     Weight:   Wt Readings from Last 1 Encounters:  05/23/16 163 lb 12.8 oz (74.3 kg)    Ideal Body Weight:  83.6 kg  BMI:  Body mass index is 21.62 kg/(m^2).  Estimated Nutritional Needs:   Kcal:  2050-2250  Protein:  100-110 grams  Fluid:  1.2 L/day  EDUCATION NEEDS:   Education needs addressed  Corrin Parker, MS, RD, LDN Pager # [phone] After hours/ weekend pager # [phone]

## 2016-05-23 NOTE — Consult Note (Signed)
   [facility] CM Inpatient Consult   05/23/2016  Thomas [name] [birth year] [id number]  Patient screened for potential [place] Management services. Patient is eligible for [place]. Electronic medical record reveals patient's discharge plan is for inpatient rehab verses a skilled nursing facility for rehab.Patient was in nursing care and spoke with inpatient RNCM and CSW regarding disposition needs and awaiting approval.   If patient's post hospital needs change please place a [facility] Care Management consult. For questions please contact:   Natividad Brood, RN BSN [facility] Liaison  [phone] business mobile phone Toll free office [phone]

## 2016-05-23 NOTE — Progress Notes (Addendum)
   Preoperative Note   Procedure: L arm AVF  Date: 05/24/16  Preoperative diagnosis: ESRD  Consent: to be obtained  Laboratory:  CBC:    Component Value Date/Time   WBC 7.5 05/23/2016 0707   RBC 2.95* 05/23/2016 0707   HGB 8.4* 05/23/2016 0707   HCT 25.6* 05/23/2016 0707   HCT 22.8* 05/10/2016 1156   PLT 78* 05/23/2016 0707   MCV 86.8 05/23/2016 0707   MCH 28.5 05/23/2016 0707   MCHC 32.8 05/23/2016 0707   RDW 14.6 05/23/2016 0707   LYMPHSABS 1.1 05/09/2016 2144   MONOABS 0.6 05/09/2016 2144   EOSABS 0.1 05/09/2016 2144   BASOSABS 0.0 05/09/2016 2144    BMP:    Component Value Date/Time   NA 130* 05/22/2016 0446   K 3.5 05/22/2016 0446   CL 96* 05/22/2016 0446   CO2 26 05/22/2016 0446   GLUCOSE 111* 05/22/2016 0446   BUN 32* 05/22/2016 0446   CREATININE 5.05* 05/22/2016 0446   CALCIUM 7.9* 05/22/2016 0446   GFRNONAA 11* 05/22/2016 0446   GFRAA 12* 05/22/2016 0446    Coagulation: Lab Results  Component Value Date   INR 1.46 05/11/2016   INR 1.40 05/10/2016   No results found for: PTT  Cardiology:  EKG: NSR (05/11/16)  CXR: No pulmonary edema. Small left pleural effusion with left basilar atelectasis or infiltrate. (05/17/16)  Adele Barthel, MD [facility] Office: [phone] Pager: [phone]  05/23/2016, 7:31 AM   Addendum   Risk, benefits, and alternatives to access surgery were discussed for 30 minutes.    The patient is aware the risks include but are not limited to: bleeding, infection, steal syndrome, nerve damage, ischemic monomelic neuropathy, failure to mature, need for additional procedures, death and stroke.    The patient would like to discuss his options with his nephrologist prior to proceeding.  He is scheduled tomorrow but we can cancel the procedure if he declines to proceed.  Adele Barthel, MD [facility] Office: [phone] Pager: [phone]  05/23/2016,  12:02 PM

## 2016-05-23 NOTE — Progress Notes (Signed)
Physical Therapy Treatment Patient Details Name: Ann Groeneveld MRN: [id number] DOB: June 25, 1946 Today's Date: 05/23/2016    History of Present Illness 70 year old male with no significant medical history (had not seen a physician in many years) presented with about 1 month of nausea with daily vomiting. Also reports poor by mouth intake and subjective weight loss. Denied any diarrhea, hematemesis or bleeding per rectum. Patient went to the urgent care and was found to have a hemoglobin of 9.8 and sent to the ED. Lab work showing hyperproteinemia, abnormal lucency of lumbar vertebrae and multiple radiolucencies in the skull x-ray indicated towards multiple myeloma; now s/p IM fixation of Impending pathologic fracture, left femur with bone biopsy; WBAT; further imaging showing L3 compression fx and L5 lesion; Neurosurgery consulted, not surgical candidate at this time; they did not order a brace    PT Comments    Session focused on therex, and educating pt and family on independent performance of therex; during session, Mr. Goffe demonstrated good use of belt or blanket roll around foot to independently manage positioning -- this will be a good tool to use when LLE gets uncomfortable or painful during HD; Mr. Kindt seemed quite pleased with session;   Noted for AV fistula creation, likely tomorrow; Continue to recommend comprehensive inpatient rehab (CIR) for post-acute therapy needs.   Follow Up Recommendations  CIR     Equipment Recommendations  Rolling walker with 5" wheels;3in1 (PT);Wheelchair (measurements PT);Wheelchair cushion (measurements PT)    Recommendations for Other Services       Precautions / Restrictions Precautions Precautions: Fall Restrictions LLE Weight Bearing: Weight bearing as tolerated    Mobility  Bed Mobility                  Transfers                    Ambulation/Gait                 Stairs            Wheelchair  Mobility    Modified Rankin (Stroke Patients Only)       Balance                                    Cognition Arousal/Alertness: Awake/alert Behavior During Therapy: WFL for tasks assessed/performed Overall Cognitive Status: Within Functional Limits for tasks assessed                      Exercises Total Joint Exercises Ankle Circles/Pumps: AROM;Both;10 reps Quad Sets: AROM;Left;20 reps Gluteal Sets: AROM;Both;10 reps Towel Squeeze: AROM;Both;10 reps Short Arc Quad: AAROM;AROM;Left;10 reps Heel Slides: AAROM;Left;20 reps;Other (comment) (10 with PT assist and 10 with use of belt for self-assist) Hip ABduction/ADduction: AAROM;Left;10 reps Other Exercises Other Exercises: Isometric Hip abduction (belt wrapped around distal femurs) x10 Other Exercises: L calf self-stretch with 20 second hold    General Comments        Pertinent Vitals/Pain Pain Assessment: Faces Faces Pain Scale: Hurts whole lot Pain Location: Grimace with heel slides Pain Descriptors / Indicators: Aching;Grimacing Pain Intervention(s): Monitored during session;Limited activity within patient's tolerance    Home Living                      Prior Function            PT Goals (current goals can now  be found in the care plan section) Acute Rehab PT Goals Patient Stated Goal: get better and go home PT Goal Formulation: With patient Time For Goal Achievement: 05/31/16 Potential to Achieve Goals: Good Progress towards PT goals: Progressing toward goals    Frequency  Min 3X/week    PT Plan Current plan remains appropriate    Co-evaluation             End of Session Equipment Utilized During Treatment: Gait belt (for self-AAROM therex) Activity Tolerance: Patient tolerated treatment well Patient left: in bed;with call bell/phone within reach;with family/visitor present     Time: 3200-3794 PT Time Calculation (min) (ACUTE ONLY): 34 min  Charges:   $Therapeutic Exercise: 23-37 mins                    G Codes:      Quin Hoop 05/23/2016, 4:44 PM  Roney Marion, [place] Pager [phone] Office [phone]

## 2016-05-23 NOTE — Progress Notes (Signed)
PROGRESS NOTE  Thomas [name]  GGY:[id number] DOB: 03-Aug-1946 DOA: 05/09/2016 PCP: No PCP Per Patient  Brief Narrative:   70 year old male with no significant medical history (had not seen a physician in many years) presented with about 1 month of nausea with daily vomiting. Also reports poor by mouth intake and subjective weight loss. Denied any diarrhea, hematemesis or bleeding per rectum. Patient went to the urgent care and was found to have a hemoglobin of 9.8 and sent to the ED. In the ED blood work showed creatinine >10 and BUN of 40. He was taking high dose of NSAIDs about 3 months back for severe low back pain, prescribed by his chiropractor. Reports that he told them for less than 2 weeks.  He was also found to have severe hyperkalemia and given insulin with dextrose. Admitted to hospitalist service for further management. Lab work showed hyperproteinemia, abnormal lucency of lumbar vertebrae and multiple radiolucencies in the skull x-ray indicated towards multiple myeloma.  Oncology was consulted.  He was also found to have pathological fracture of left femur and underwent IM nailing.  Pt is now on Hemodialysis.  Left arm AVF 6/13 (Dr. Bridgett Larsson).   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 -SPEP, light chains and IFE consistent with IgG  kappa multiple myeloma. -Renal biopsy results Still pending.  - Bone biopsy from left hip surgery:  Sheets of atypical plasma cells - Started hemodialysis 6/2 - CLIP process started - Vein mapping complete. Vascular surgery placing left arm AVF 6/13 (Dr. Bridgett Larsson).  Multiple myeloma with diffuse skeletal lesions -Bone survey showing metastatic bone lesions with largest lesion involving left iliac wing. Also lesion involving midshaft of left femur with pathological fracture. L3 and L5 compression fracture. - MRI of the spine shows large lumbar lesion associated with extraosseous extension of tumor into the left paraspinous soft tissue at L5 with moderate to severe canal stenosis. Evaluated by neurosurgery and given no neurological deficit recommends no surgical intervention. Recommend radiation therapy.  - plan for outpatient palliative radiation by Dr. Lisbeth Renshaw - Bone marrow biopsy done on 6/6.  - Dr. Alvy Bimler following - Started dexamethasone and velcade on 6/8.  RN from [facility] to come to [facility] to provide Velcade   Low grade fever, asymptomatic, may be post-operative atelectasis  -  Resolved now. -  CXR 6/7:  Possible left lower lobe infiltrate vs. Atelectasis. Encouraged IS and improving.  -  IS and OOB to chair as soon as possible -  If he becomes sicker, blood cultures and pneumonia order set for aspiration pneumonia -  MRSA PCR negative on 6/4 so would probably not start vancomycin  -  UA shows a lot of RBCs.    Cough (nonproductive)  - Suspect this is related to allergies  and postnasal drip.  - Improving with cough suppressant syrup as needed - dextromethorphan (Pt cannot tolerate codeine) - check repeat chest x ray in AM 6/14.    Pathological fracture of left femur IM nailing with open biopsy of the left femur with intraoperative frozen section done on 6/6.Marland Kitchen Results pending. continue pain control. Outpatient follow-up with orthopedics after discharge-will need outpatient XRT to left  femur. -Pt is a good candidate for rehab and is motivated, will consult CIR to evaluate.   Anemia due to ARF, myeloma, and vitamin b12 deficiency - Multiple transfusions during the course of this hospitalization. Follow CBCs.  Progressive thrombocytopenia. Platelet function assay is high. Avoid heparin products.  Received 2 u platelets.  Vitamin B12 deficiency (106). - anti-IF - anti-parietal cell - IM supplementation for now  Hyperkalemia, resolved. Now on dialysis.  Malnutrition of moderate degree -  Renal diet with supplements  Constipation  -  Daily miralax -  Bisacodyl suppository -  Continue senna  DVT prophylaxis:  SCDs Code Status:  full Family Communication:  Discussed with patient and spouse at bedside. Disposition Plan:  Possible CIR.   SW consult placed.  CLIP started.As per nephrology service, will not be ready for discharge until sometime next week.   Consultants:  Nephrology (Dr. Miley Footman) IR Oncology (Dr. Alvy Bimler) Orthopedics (Dr Delfino Lovett) Neurosurgery Radiation onc ( Dr Tammi Klippel)  Procedures:  Left renal biopsy Bone marrow biopsy on 6/6 IM nailding of the left femur fracture  Antimicrobials:   none   Subjective: Pt says cough is improving with cough syrup.  He was seen in HD today.  No shortness of breath or chest pain.    Objective: Filed Vitals:   05/23/16 0730 05/23/16 0800 05/23/16 0830 05/23/16 0900  BP: 132/67 130/64 124/58 124/62  Pulse: 80 80 84 84  Temp:      TempSrc:      Resp: 15 20 13 18   Height:      Weight:      SpO2:        Intake/Output Summary (Last 24 hours) at 05/23/16 0951 Last data filed at 05/23/16 0600  Gross per 24 hour  Intake    315 ml  Output    700 ml  Net   -385 ml   Filed Weights   05/21/16 2024 05/22/16 2121 05/23/16 0647  Weight: 177 lb 4 oz (80.4 kg) 167 lb 12.8 oz (76.114 kg) 168 lb 3.4 oz (76.3 kg)    Examination:  General exam:  Pleasant middle-aged male lying comfortably in bed. Looks  cachectic and chronically ill but in no distress.  HEENT:  NCAT, MMM Respiratory system:  Diminished at the bilateral bases. Rest of lung fields clear to auscultation. No increased work of breathing.  Cardiovascular system: Regular rate and rhythm, normal J2/E2.  2/6 systolic murmur, no gallops or clicks.  Warm extremities Gastrointestinal system: Normal active bowel sounds, soft, nondistended, nontender. MSK:  Normal tone and bulk, 1+ left lower extremity edema.  Neuro:  Grossly intact. Alert and oriented. No focal deficits.    Data Reviewed: I have personally reviewed following labs and imaging studies  CBC:  Recent Labs Lab 05/17/16 0648 05/18/16 0330 05/20/16 0937 05/23/16 0707  WBC 6.5 5.5 5.5 7.5  HGB 6.7* 8.4* 8.3* 8.4*  HCT 20.2* 25.3* 24.6* 25.6*  MCV 89.4 87.5 85.7 86.8  PLT 111* 107* 99* 78*   Basic Metabolic Panel:  Recent Labs Lab 05/17/16 6834  05/19/16 0604 05/20/16 0740 05/21/16 0507 05/22/16 0446  05/23/16 0706  NA 132*  < > 129* 128* 131* 130* 129*  K 3.8  < > 3.6 3.5 3.4* 3.5 3.6  CL 93*  < > 95* 92* 96* 96* 94*  CO2 28  < > 28 26 27 26 25   GLUCOSE 77  < > 217* 182* 131* 111* 110*  BUN 14  < > 16 36* 22* 32* 42*  CREATININE 4.49*  < > 4.04* 5.51* 3.74* 5.05* 6.38*  CALCIUM 7.8*  < > 7.8* 7.8* 7.8* 7.9* 8.3*  PHOS 4.6  --   --   --   --   --  4.6  < > = values in this interval not displayed. GFR: Estimated Creatinine Clearance: 12 mL/min (by C-G formula based on Cr of 6.38). Liver Function Tests:  Recent Labs Lab 05/18/16 0330 05/19/16 0604 05/20/16 0740 05/21/16 0507 05/22/16 0446 05/23/16 0706  AST 22 20 26 27 22   --   ALT 14* 13* 16* 23 20  --   ALKPHOS 54 55 64 72 72  --   BILITOT 1.5* 1.2 1.2 1.2 1.2  --   PROT 7.3 7.4 7.2 7.2 7.0  --   ALBUMIN 1.9* 1.8* 1.9* 1.8* 1.9* 1.8*   No results for input(s): LIPASE, AMYLASE in the last 168 hours. No results for input(s): AMMONIA in the last 168 hours. Coagulation Profile: No results for  input(s): INR, PROTIME in the last 168 hours. Cardiac Enzymes: No results for input(s): CKTOTAL, CKMB, CKMBINDEX, TROPONINI in the last 168 hours. BNP (last 3 results) No results for input(s): PROBNP in the last 8760 hours. HbA1C: No results for input(s): HGBA1C in the last 72 hours. CBG: No results for input(s): GLUCAP in the last 168 hours. Lipid Profile: No results for input(s): CHOL, HDL, LDLCALC, TRIG, CHOLHDL, LDLDIRECT in the last 72 hours. Thyroid Function Tests: No results for input(s): TSH, T4TOTAL, FREET4, T3FREE, THYROIDAB in the last 72 hours. Anemia Panel: No results for input(s): VITAMINB12, FOLATE, FERRITIN, TIBC, IRON, RETICCTPCT in the last 72 hours. Urine analysis:    Component Value Date/Time   COLORURINE YELLOW 05/21/2016 1531   APPEARANCEUR CLOUDY* 05/21/2016 1531   LABSPEC 1.008 05/21/2016 1531   PHURINE 7.5 05/21/2016 1531   GLUCOSEU NEGATIVE 05/21/2016 1531   HGBUR LARGE* 05/21/2016 1531   BILIRUBINUR NEGATIVE 05/21/2016 1531   KETONESUR NEGATIVE 05/21/2016 1531   PROTEINUR 100* 05/21/2016 1531   NITRITE NEGATIVE 05/21/2016 1531   LEUKOCYTESUR NEGATIVE 05/21/2016 1531   Sepsis Labs: @LABRCNTIP (procalcitonin:4,lacticidven:4)  ) Recent Results (from the past 240 hour(s))  Surgical pcr screen     Status: None   Collection Time: 05/14/16  1:00 AM  Result Value Ref Range Status   MRSA, PCR NEGATIVE NEGATIVE Final   Staphylococcus aureus NEGATIVE NEGATIVE Final    Comment:        The Xpert SA Assay (FDA approved for NASAL specimens in patients over 42 years of age), is one component of a comprehensive surveillance program.  Test performance has been validated by [facility] for patients greater than or equal to 63 year old. It is not intended to diagnose infection nor to guide or monitor treatment.   Culture, blood (routine x 2)     Status: None   Collection Time: 05/16/16  8:05 PM  Result Value Ref Range Status   Specimen Description BLOOD  HEMODIALYSIS CATHETER  Final   Special Requests BOTTLES DRAWN AEROBIC AND ANAEROBIC 10CC  Final   Culture NO GROWTH 5 DAYS  Final  Report Status 05/21/2016 FINAL  Final  Culture, blood (routine x 2)     Status: None   Collection Time: 05/16/16  8:15 PM  Result Value Ref Range Status   Specimen Description BLOOD HEMODIALYSIS CATHETER  Final   Special Requests BOTTLES DRAWN AEROBIC AND ANAEROBIC 10CC  Final   Culture NO GROWTH 5 DAYS  Final   Report Status 05/21/2016 FINAL  Final  Culture, Urine     Status: Abnormal   Collection Time: 05/21/16  3:31 PM  Result Value Ref Range Status   Specimen Description URINE, RANDOM  Final   Special Requests NONE  Final   Culture MULTIPLE SPECIES PRESENT, SUGGEST RECOLLECTION (A)  Final   Report Status 05/23/2016 FINAL  Final    Radiology Studies: No results found.  Scheduled Meds: . sodium chloride   Intravenous Once  . sodium chloride   Intravenous Once  . sodium chloride   Intravenous Once  . acyclovir  200 mg Oral BID  . darbepoetin (ARANESP) injection - DIALYSIS  100 mcg Intravenous Q Sat-HD  . dexamethasone  40 mg Oral Weekly  . docusate sodium  100 mg Oral BID  . feeding supplement (NEPRO CARB STEADY)  237 mL Oral BID BM  . multivitamin  1 tablet Oral QHS  . polyethylene glycol  17 g Oral BID  . senna-docusate  2 tablet Oral BID  . vitamin B-12  1,000 mcg Oral Daily   Continuous Infusions: . sodium chloride    . sodium chloride       LOS: 13 days   Time spent: 30 min  Irwin Brakeman, MD [facility] Pager [phone]  If 7PM-7AM, please contact night-coverage [email or web address] Password [facility] 05/23/2016, 9:51 AM

## 2016-05-24 ENCOUNTER — Inpatient Hospital Stay (HOSPITAL_COMMUNITY): Payer: PPO

## 2016-05-24 ENCOUNTER — Other Ambulatory Visit: Payer: Self-pay | Admitting: Hematology and Oncology

## 2016-05-24 ENCOUNTER — Inpatient Hospital Stay (HOSPITAL_COMMUNITY): Payer: PPO | Admitting: Certified Registered Nurse Anesthetist

## 2016-05-24 ENCOUNTER — Encounter (HOSPITAL_COMMUNITY): Payer: Self-pay | Admitting: Anesthesiology

## 2016-05-24 ENCOUNTER — Encounter (HOSPITAL_COMMUNITY)
Admission: EM | Disposition: A | Discharge status: Rehabilitation Facility | Payer: Self-pay | Source: Home / Self Care | Attending: Internal Medicine

## 2016-05-24 DIAGNOSIS — N184 Chronic kidney disease, stage 4 (severe): Secondary | ICD-10-CM

## 2016-05-24 DIAGNOSIS — E538 Deficiency of other specified B group vitamins: Secondary | ICD-10-CM

## 2016-05-24 DIAGNOSIS — T451X5A Adverse effect of antineoplastic and immunosuppressive drugs, initial encounter: Secondary | ICD-10-CM

## 2016-05-24 DIAGNOSIS — C9000 Multiple myeloma not having achieved remission: Secondary | ICD-10-CM | POA: Diagnosis present

## 2016-05-24 DIAGNOSIS — D61810 Antineoplastic chemotherapy induced pancytopenia: Secondary | ICD-10-CM | POA: Diagnosis present

## 2016-05-24 HISTORY — PX: AV FISTULA PLACEMENT: SHX1204

## 2016-05-24 LAB — CBC WITH DIFFERENTIAL/PLATELET
BASOS ABS: 0 10*3/uL (ref 0.0–0.1)
BASOS PCT: 0 %
EOS ABS: 0 10*3/uL (ref 0.0–0.7)
EOS PCT: 0 %
HCT: 25.5 % — ABNORMAL LOW (ref 39.0–52.0)
Hemoglobin: 8.1 g/dL — ABNORMAL LOW (ref 13.0–17.0)
LYMPHS PCT: 11 %
Lymphs Abs: 0.5 10*3/uL — ABNORMAL LOW (ref 0.7–4.0)
MCH: 28.5 pg (ref 26.0–34.0)
MCHC: 31.8 g/dL (ref 30.0–36.0)
MCV: 89.8 fL (ref 78.0–100.0)
MONO ABS: 0.6 10*3/uL (ref 0.1–1.0)
Monocytes Relative: 12 %
Neutro Abs: 3.6 10*3/uL (ref 1.7–7.7)
Neutrophils Relative %: 76 %
PLATELETS: 70 10*3/uL — AB (ref 150–400)
RBC: 2.84 MIL/uL — AB (ref 4.22–5.81)
RDW: 15 % (ref 11.5–15.5)
WBC: 4.8 10*3/uL (ref 4.0–10.5)

## 2016-05-24 LAB — BASIC METABOLIC PANEL
Anion gap: 6 (ref 5–15)
BUN: 22 mg/dL — AB (ref 6–20)
CHLORIDE: 97 mmol/L — AB (ref 101–111)
CO2: 27 mmol/L (ref 22–32)
Calcium: 7.9 mg/dL — ABNORMAL LOW (ref 8.9–10.3)
Creatinine, Ser: 4.07 mg/dL — ABNORMAL HIGH (ref 0.61–1.24)
GFR calc Af Amer: 16 mL/min — ABNORMAL LOW (ref >60)
GFR, EST NON AFRICAN AMERICAN: 14 mL/min — AB (ref >60)
GLUCOSE: 137 mg/dL — AB (ref 65–99)
POTASSIUM: 4.1 mmol/L (ref 3.5–5.1)
Sodium: 130 mmol/L — ABNORMAL LOW (ref 135–145)

## 2016-05-24 LAB — COMPREHENSIVE METABOLIC PANEL
ALT: 16 U/L — AB (ref 17–63)
AST: 19 U/L (ref 15–41)
Albumin: 1.9 g/dL — ABNORMAL LOW (ref 3.5–5.0)
Alkaline Phosphatase: 72 U/L (ref 38–126)
Anion gap: 6 (ref 5–15)
BUN: 24 mg/dL — AB (ref 6–20)
CHLORIDE: 103 mmol/L (ref 101–111)
CO2: 26 mmol/L (ref 22–32)
Calcium: 7.5 mg/dL — ABNORMAL LOW (ref 8.9–10.3)
Creatinine, Ser: 4.43 mg/dL — ABNORMAL HIGH (ref 0.61–1.24)
GFR calc Af Amer: 14 mL/min — ABNORMAL LOW (ref >60)
GFR, EST NON AFRICAN AMERICAN: 12 mL/min — AB (ref >60)
Glucose, Bld: 104 mg/dL — ABNORMAL HIGH (ref 65–99)
POTASSIUM: 4.1 mmol/L (ref 3.5–5.1)
SODIUM: 135 mmol/L (ref 135–145)
Total Bilirubin: 1.8 mg/dL — ABNORMAL HIGH (ref 0.3–1.2)
Total Protein: 7.2 g/dL (ref 6.5–8.1)

## 2016-05-24 SURGERY — ARTERIOVENOUS (AV) FISTULA CREATION
Anesthesia: Monitor Anesthesia Care | Site: Arm Upper | Laterality: Left

## 2016-05-24 MED ORDER — SODIUM CHLORIDE 0.9 % IV SOLN
INTRAVENOUS | Status: DC | PRN
  Administered 2016-05-24: 09:00:00

## 2016-05-24 MED ORDER — PROPOFOL 500 MG/50ML IV EMUL
INTRAVENOUS | Status: DC | PRN
  Administered 2016-05-24: 100 ug/kg/min via INTRAVENOUS

## 2016-05-24 MED ORDER — PHENYLEPHRINE 40 MCG/ML (10ML) SYRINGE FOR IV PUSH (FOR BLOOD PRESSURE SUPPORT)
PREFILLED_SYRINGE | INTRAVENOUS | Status: AC
  Filled 2016-05-24: qty 10

## 2016-05-24 MED ORDER — FENTANYL CITRATE (PF) 100 MCG/2ML IJ SOLN
INTRAMUSCULAR | Status: DC | PRN
  Administered 2016-05-24: 50 ug via INTRAVENOUS
  Administered 2016-05-24: 25 ug via INTRAVENOUS

## 2016-05-24 MED ORDER — ONDANSETRON HCL 4 MG/2ML IJ SOLN: 4.0000 mg | Freq: Once | INTRAMUSCULAR | Status: DC | PRN

## 2016-05-24 MED ORDER — HEPARIN SODIUM (PORCINE) 5000 UNIT/ML IJ SOLN
5000.0000 [IU] | Freq: Three times a day (TID) | INTRAMUSCULAR | Status: DC
  Administered 2016-05-24 – 2016-05-29 (×11): 5000 [IU] via SUBCUTANEOUS
  Filled 2016-05-24 (×9): qty 1

## 2016-05-24 MED ORDER — HEPARIN SODIUM (PORCINE) 1000 UNIT/ML IJ SOLN
INTRAMUSCULAR | Status: AC
  Filled 2016-05-24: qty 1

## 2016-05-24 MED ORDER — LIDOCAINE HCL (CARDIAC) 20 MG/ML IV SOLN
INTRAVENOUS | Status: DC | PRN
  Administered 2016-05-24: 50 mg via INTRAVENOUS

## 2016-05-24 MED ORDER — PHENYLEPHRINE HCL 10 MG/ML IJ SOLN
INTRAMUSCULAR | Status: DC | PRN
  Administered 2016-05-24: 80 ug via INTRAVENOUS
  Administered 2016-05-24 (×2): 40 ug via INTRAVENOUS
  Administered 2016-05-24 (×3): 80 ug via INTRAVENOUS

## 2016-05-24 MED ORDER — MIDAZOLAM HCL 2 MG/2ML IJ SOLN
INTRAMUSCULAR | Status: AC
  Filled 2016-05-24: qty 2

## 2016-05-24 MED ORDER — LIDOCAINE HCL (PF) 1 % IJ SOLN
INTRAMUSCULAR | Status: AC
  Filled 2016-05-24: qty 30

## 2016-05-24 MED ORDER — LIDOCAINE HCL (PF) 1 % IJ SOLN
INTRAMUSCULAR | Status: DC | PRN
  Administered 2016-05-24: 30 mL

## 2016-05-24 MED ORDER — VITAMIN D (ERGOCALCIFEROL) 1.25 MG (50000 UNIT) PO CAPS
50000.0000 [IU] | ORAL_CAPSULE | ORAL | Status: DC
  Administered 2016-05-25: 50000 [IU] via ORAL
  Filled 2016-05-24: qty 1

## 2016-05-24 MED ORDER — LIDOCAINE 2% (20 MG/ML) 5 ML SYRINGE
INTRAMUSCULAR | Status: AC
  Filled 2016-05-24: qty 5

## 2016-05-24 MED ORDER — VANCOMYCIN HCL IN DEXTROSE 1-5 GM/200ML-% IV SOLN
INTRAVENOUS | Status: AC
  Administered 2016-05-24: 1000 mg via INTRAVENOUS
  Filled 2016-05-24: qty 200

## 2016-05-24 MED ORDER — MIDAZOLAM HCL 5 MG/5ML IJ SOLN
INTRAMUSCULAR | Status: DC | PRN
  Administered 2016-05-24: 2 mg via INTRAVENOUS

## 2016-05-24 MED ORDER — HYPROMELLOSE (GONIOSCOPIC) 2.5 % OP SOLN
2.0000 [drp] | Freq: Four times a day (QID) | OPHTHALMIC | Status: DC | PRN
  Administered 2016-05-25: 2 [drp] via OPHTHALMIC
  Filled 2016-05-24 (×3): qty 15

## 2016-05-24 MED ORDER — FENTANYL CITRATE (PF) 100 MCG/2ML IJ SOLN: 25.0000 ug | INTRAMUSCULAR | Status: DC | PRN

## 2016-05-24 MED ORDER — POLYVINYL ALCOHOL 1.4 % OP SOLN
2.0000 [drp] | OPHTHALMIC | Status: DC | PRN
  Administered 2016-05-24: 2 [drp] via OPHTHALMIC
  Filled 2016-05-24: qty 15

## 2016-05-24 MED ORDER — OXYCODONE-ACETAMINOPHEN 5-325 MG PO TABS: 1.0000 | ORAL_TABLET | Freq: Four times a day (QID) | ORAL | Status: DC | PRN

## 2016-05-24 MED ORDER — 0.9 % SODIUM CHLORIDE (POUR BTL) OPTIME
TOPICAL | Status: DC | PRN
  Administered 2016-05-24: 1000 mL

## 2016-05-24 MED ORDER — FENTANYL CITRATE (PF) 250 MCG/5ML IJ SOLN
INTRAMUSCULAR | Status: AC
  Filled 2016-05-24: qty 5

## 2016-05-24 MED ORDER — ONDANSETRON HCL 4 MG/2ML IJ SOLN
INTRAMUSCULAR | Status: DC | PRN
  Administered 2016-05-24: 4 mg via INTRAVENOUS

## 2016-05-24 MED ORDER — ONDANSETRON HCL 4 MG/2ML IJ SOLN
INTRAMUSCULAR | Status: AC
  Filled 2016-05-24: qty 2

## 2016-05-24 SURGICAL SUPPLY — 34 items
ARMBAND PINK RESTRICT EXTREMIT (MISCELLANEOUS) ×3 IMPLANT
CANISTER SUCTION 2500CC (MISCELLANEOUS) ×3 IMPLANT
CLIP TI MEDIUM 6 (CLIP) ×3 IMPLANT
CLIP TI WIDE RED SMALL 6 (CLIP) ×3 IMPLANT
COVER PROBE W GEL 5X96 (DRAPES) ×3 IMPLANT
DECANTER SPIKE VIAL GLASS SM (MISCELLANEOUS) ×3 IMPLANT
ELECT REM PT RETURN 9FT ADLT (ELECTROSURGICAL) ×3
ELECTRODE REM PT RTRN 9FT ADLT (ELECTROSURGICAL) ×1 IMPLANT
GLOVE BIO SURGEON STRL SZ 6.5 (GLOVE) ×2 IMPLANT
GLOVE BIO SURGEON STRL SZ7 (GLOVE) ×3 IMPLANT
GLOVE BIO SURGEONS STRL SZ 6.5 (GLOVE) ×1
GLOVE BIOGEL PI IND STRL 6.5 (GLOVE) ×4 IMPLANT
GLOVE BIOGEL PI IND STRL 7.0 (GLOVE) ×1 IMPLANT
GLOVE BIOGEL PI IND STRL 7.5 (GLOVE) ×1 IMPLANT
GLOVE BIOGEL PI INDICATOR 6.5 (GLOVE) ×8
GLOVE BIOGEL PI INDICATOR 7.0 (GLOVE) ×2
GLOVE BIOGEL PI INDICATOR 7.5 (GLOVE) ×2
GLOVE ECLIPSE 6.5 STRL STRAW (GLOVE) ×3 IMPLANT
GOWN STRL REUS W/ TWL LRG LVL3 (GOWN DISPOSABLE) ×3 IMPLANT
GOWN STRL REUS W/TWL LRG LVL3 (GOWN DISPOSABLE) ×6
HEMOSTAT SPONGE AVITENE ULTRA (HEMOSTASIS) IMPLANT
KIT BASIN OR (CUSTOM PROCEDURE TRAY) ×3 IMPLANT
KIT ROOM TURNOVER OR (KITS) ×3 IMPLANT
LIQUID BAND (GAUZE/BANDAGES/DRESSINGS) ×3 IMPLANT
NS IRRIG 1000ML POUR BTL (IV SOLUTION) ×3 IMPLANT
PACK CV ACCESS (CUSTOM PROCEDURE TRAY) ×3 IMPLANT
PAD ARMBOARD 7.5X6 YLW CONV (MISCELLANEOUS) ×6 IMPLANT
SUT MNCRL AB 4-0 PS2 18 (SUTURE) ×3 IMPLANT
SUT PROLENE 6 0 BV (SUTURE) ×3 IMPLANT
SUT PROLENE 7 0 BV 1 (SUTURE) ×3 IMPLANT
SUT VIC AB 3-0 SH 27 (SUTURE) ×2
SUT VIC AB 3-0 SH 27X BRD (SUTURE) ×1 IMPLANT
UNDERPAD 30X30 INCONTINENT (UNDERPADS AND DIAPERS) ×3 IMPLANT
WATER STERILE IRR 1000ML POUR (IV SOLUTION) ×3 IMPLANT

## 2016-05-24 NOTE — Progress Notes (Signed)
PROGRESS NOTE                                                                                                                                                                                                             Patient Demographics:    Thomas [name], is a 70 y.o. male, DOB - November 12, 1946, NWG:[id number]  Admit date - 05/09/2016   Admitting Physician Etta Quill, DO  Outpatient Primary MD for the patient is No PCP Per Patient  LOS - 14  Outpatient Specialists:None  Chief Complaint  Patient presents with  . low hgb        Brief Narrative  70 year old male with no significant past medical history (had not seen a physician in many years) presented with one month of nausea with daily vomiting and poor by mouth intake with subjective weight loss. He went to an urgent care where he was found to have hemoglobin of 9.8 and sent to the ED with he was found to have creatinine of >10 and BUN of 40. Patient reported having low back pain about 3 months back and was taking several NSAIDs at home. In the ED was also severely hyperkalemic with anemia and thrombocytopenia. Admitted to hospitalist service. Further workup showed diffuse skeletal hyperlucency with pathological left femur fracture with biopsy suggestive of multiple myeloma. Patient now on hemodialysis and started on chemotherapy.   Subjective:   Patient seen after returning from AV fistula placement in his right arm. He has been able to ambulate with some support. Reports his back pain to be improving. Complains of dry eyes.   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 Left UE AV fistula placed today. Has been on dialysis in 6/2. CLI P process started. Oncologist to full that patient's renal function should recover once his response to chemotherapy. Renal closely managing.  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 Also had pathological fracture of left midshaft femur that was repaired. bone biopsy suggest multiple myeloma. MRI of the lumbar spine shows large lumbar lesion associated with Extraosseous extension of tumor into the left paraspinous soft tissue at L5 with moderate to severe cannot stenosis. Given absence of neurological deficit neurosurgery recommends  no surgical intervention. He was recommended for radiation therapy but since patient's back pain has been improving and been able to participate with PT this has been on hold. (Was seen by radiation oncologist one week back). -Patient started on Velcade. 6/8. (Velcade subcutaneous injections on days 1,4,8,11 on 05/18/16.  Next dose , day 8 due on 05/25/16). Weekly pulse dose dexamethasone starting 6/8.  Low-grade fever with cough Suspect atelectasis postop. Encourage incentive spirometry. Cough better with antitussives. Hold off on antibiotics.  Acute pulmonary edema on chest x-ray Will check 2-D echo.  Pathological fracture of left femur Status post IM nailing with open biopsy. Pain control when necessary. Outpatient follow-up with orthopedics. Seen by PT and recommends he IR. Consulted. Will resume subcutaneous heparin at low-dose for DVT prophylaxis.  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 Currently stable.  Severe thrombocytopenia Initially due to multiple myeloma and now possibly due to chemotherapy. Stable at low levels. Received 2 units platelets 1 week back. Dr. Alvy Bimler recommended to start him on low-dose heparin for DVT prophylaxis.  Vitamin B12 deficiency Getting supplementation.  Malnutrition of moderate degree Added supplement.  Constipation Daily MiraLAX.  Code Status : Full code  Family Communication  : Wife and daughter at  bedside  Disposition Plan  : Awaiting CIR placement and outpatient dialysis  Barriers For Discharge :   Consults  :   Nephrology Oncology ( Dr Alvy Bimler) Orthopedics Neurosurgery   Procedures  :  HD catheter placement IM nailing of left femur Bone marrow biopsy AV fistula  DVT Prophylaxis  : Subcutaneous heparin  Lab Results  Component Value Date   PLT 70* 05/24/2016    Antibiotics  :    Anti-infectives    Start     Dose/Rate Route Frequency Ordered Stop   05/24/16 0821  vancomycin (VANCOCIN) 1-5 GM/200ML-% IVPB    Comments:  Rock, Anderson Malta   : cabinet override      05/24/16 0821 05/24/16 0920   05/17/16 1445  acyclovir (ZOVIRAX) 200 MG capsule 200 mg     200 mg Oral 2 times daily 05/17/16 1435     05/14/16 1915  clindamycin (CLEOCIN) IVPB 600 mg     600 mg 100 mL/hr over 30 Minutes Intravenous Every 6 hours 05/14/16 1912 05/15/16 0131   05/14/16 1200  clindamycin (CLEOCIN) IVPB 900 mg     900 mg 100 mL/hr over 30 Minutes Intravenous To Surgery 05/14/16 0055 05/14/16 1121   05/11/16 1225  vancomycin (VANCOCIN) 1-5 GM/200ML-% IVPB  Status:  Discontinued    Comments:  Shelton, Whitney   : cabinet override      05/11/16 1225 05/11/16 1234   05/11/16 1100  vancomycin (VANCOCIN) IVPB 1000 mg/200 mL premix     1,000 mg 200 mL/hr over 60 Minutes Intravenous To Radiology 05/11/16 0911 05/11/16 1354        Objective:   Filed Vitals:   05/24/16 0945 05/24/16 1000 05/24/16 1015 05/24/16 1027  BP: 107/59 118/58 133/63 119/58  Pulse: 75 80 80 78  Temp:   98 F (36.7 C) 98.2 F (36.8 C)  TempSrc:    Oral  Resp: 20 19  18   Height:      Weight:      SpO2: 100% 95% 96% 94%    Wt Readings from Last 3 Encounters:  05/23/16 72.893 kg (160 lb 11.2 oz)     Intake/Output Summary (Last 24 hours) at 05/24/16 1639 Last data filed at 05/24/16 1000  Gross per 24 hour  Intake   1002 ml  Output    405 ml  Net    597 ml     Physical Exam  Gen: not in distress, appears  tired HEENT: Pallor present, moist mucosa, supple neck Chest: clear b/l, no added sounds, dialysis catheter CVS: N S1&S2, no murmurs, rubs or gallop GI: soft, NT, ND, BS+ Musculoskeletal: warm, no edema, left AV fistula site appears clean, Ace wrap over left thigh CNS: Alert and oriented    Data Review:    CBC  Recent Labs Lab 05/18/16 0330 05/20/16 0937 05/23/16 0707 05/24/16 0648  WBC 5.5 5.5 7.5 4.8  HGB 8.4* 8.3* 8.4* 8.1*  HCT 25.3* 24.6* 25.6* 25.5*  PLT 107* 99* 78* 70*  MCV 87.5 85.7 86.8 89.8  MCH 29.1 28.9 28.5 28.5  MCHC 33.2 33.7 32.8 31.8  RDW 16.0* 14.8 14.6 15.0  LYMPHSABS  --   --   --  0.5*  MONOABS  --   --   --  0.6  EOSABS  --   --   --  0.0  BASOSABS  --   --   --  0.0    Chemistries   Recent Labs Lab 05/19/16 0604 05/20/16 0740 05/21/16 0507 05/22/16 0446 05/23/16 0706 05/23/16 2331 05/24/16 0648  NA 129* 128* 131* 130* 129* 130* 135  K 3.6 3.5 3.4* 3.5 3.6 4.1 4.1  CL 95* 92* 96* 96* 94* 97* 103  CO2 28 26 27 26 25 27 26   GLUCOSE 217* 182* 131* 111* 110* 137* 104*  BUN 16 36* 22* 32* 42* 22* 24*  CREATININE 4.04* 5.51* 3.74* 5.05* 6.38* 4.07* 4.43*  CALCIUM 7.8* 7.8* 7.8* 7.9* 8.3* 7.9* 7.5*  AST 20 26 27 22   --   --  19  ALT 13* 16* 23 20  --   --  16*  ALKPHOS 55 64 72 72  --   --  72  BILITOT 1.2 1.2 1.2 1.2  --   --  1.8*   ------------------------------------------------------------------------------------------------------------------ No results for input(s): CHOL, HDL, LDLCALC, TRIG, CHOLHDL, LDLDIRECT in the last 72 hours.  No results found for: HGBA1C ------------------------------------------------------------------------------------------------------------------ No results for input(s): TSH, T4TOTAL, T3FREE, THYROIDAB in the last 72 hours.  Invalid input(s): FREET3 ------------------------------------------------------------------------------------------------------------------ No results for input(s): VITAMINB12,  FOLATE, FERRITIN, TIBC, IRON, RETICCTPCT in the last 72 hours.  Coagulation profile No results for input(s): INR, PROTIME in the last 168 hours.  No results for input(s): DDIMER in the last 72 hours.  Cardiac Enzymes No results for input(s): CKMB, TROPONINI, MYOGLOBIN in the last 168 hours.  Invalid input(s): CK ------------------------------------------------------------------------------------------------------------------ No results found for: BNP  Inpatient Medications  Scheduled Meds: . sodium chloride   Intravenous Once  . sodium chloride   Intravenous Once  . acyclovir  200 mg Oral BID  . darbepoetin (ARANESP) injection - DIALYSIS  100 mcg Intravenous Q Sat-HD  . dexamethasone  40 mg Oral Weekly  . docusate sodium  100 mg Oral BID  . feeding supplement (NEPRO CARB STEADY)  237 mL Oral BID BM  . heparin subcutaneous  5,000 Units Subcutaneous Q8H  . multivitamin  1 tablet Oral QHS  . polyethylene glycol  17 g Oral BID  . senna-docusate  2 tablet Oral BID  . vitamin B-12  1,000 mcg Oral Daily  . [START ON 05/25/2016] Vitamin D (Ergocalciferol)  50,000 Units Oral Q7 days   Continuous Infusions: . sodium chloride    . sodium chloride  PRN Meds:.acetaminophen **OR** acetaminophen, alum & mag hydroxide-simeth, bisacodyl, fentaNYL (SUBLIMAZE) injection, guaiFENesin-dextromethorphan, HYDROcodone-acetaminophen, menthol-cetylpyridinium **OR** phenol, methocarbamol, metoCLOPramide **OR** metoCLOPramide (REGLAN) injection, ondansetron **OR** ondansetron (ZOFRAN) IV, ondansetron (ZOFRAN) IV, oxyCODONE-acetaminophen, polyvinyl alcohol  Micro Results Recent Results (from the past 240 hour(s))  Culture, blood (routine x 2)     Status: None   Collection Time: 05/16/16  8:05 PM  Result Value Ref Range Status   Specimen Description BLOOD HEMODIALYSIS CATHETER  Final   Special Requests BOTTLES DRAWN AEROBIC AND ANAEROBIC 10CC  Final   Culture NO GROWTH 5 DAYS  Final   Report Status  05/21/2016 FINAL  Final  Culture, blood (routine x 2)     Status: None   Collection Time: 05/16/16  8:15 PM  Result Value Ref Range Status   Specimen Description BLOOD HEMODIALYSIS CATHETER  Final   Special Requests BOTTLES DRAWN AEROBIC AND ANAEROBIC 10CC  Final   Culture NO GROWTH 5 DAYS  Final   Report Status 05/21/2016 FINAL  Final  Culture, Urine     Status: Abnormal   Collection Time: 05/21/16  3:31 PM  Result Value Ref Range Status   Specimen Description URINE, RANDOM  Final   Special Requests NONE  Final   Culture MULTIPLE SPECIES PRESENT, SUGGEST RECOLLECTION (A)  Final   Report Status 05/23/2016 FINAL  Final    Radiology Reports Dg Skull 1-3 Views  05/11/2016  CLINICAL DATA:  Myeloma EXAM: SKULL - 1-3 VIEW COMPARISON:  None. FINDINGS: Frontal view shows 13 mm lucency in the parasagittal left skull. On the lateral view approximately 10 round or oval lucencies are identified, the largest measuring 14 mm in maximal diameter. There are also lucencies in both mandibles. IMPRESSION: Multiple radiolucencies consistent with myeloma. Electronically Signed   By: Skipper Cliche M.D.   On: 05/11/2016 10:42   Dg Chest 2 View  05/10/2016  CLINICAL DATA:  Congestive heart failure. EXAM: CHEST  2 VIEW COMPARISON:  None. FINDINGS: The heart size and mediastinal contours are within normal limits. No pneumothorax or pleural effusion is noted. Right lung is clear. Density is seen in left lower lobe which may represent either pleural-based mass or possibly expansile lesion within left posterior rib. The visualized skeletal structures are unremarkable. IMPRESSION: Left lower lobe density is noted representing pleural-based mass or expansile lesion within left posterior rib. CT scan of the chest is recommended to evaluate for possible neoplasm or malignancy. Electronically Signed   By: Marijo Conception, M.D.   On: 05/10/2016 13:08   Dg Lumbar Spine 2-3 Views  05/10/2016  CLINICAL DATA:  Lower lumbar pain  radiating down the left leg. Injury in February 2017. EXAM: LUMBAR SPINE - 2-3 VIEW COMPARISON:  None. FINDINGS: Transitional S1 vertebra. Abnormal lucency in the left L5 vertebra appreciable on the frontal projection, potentially with some slight compression in this vicinity, but with lucency out of proportion to the compression. Irregular margins of the cortex of the S1 vertebra on the lateral projection. Anterior wedge compression fracture at L3 with 30% loss of vertebral body height. Aortoiliac atherosclerotic vascular disease. IMPRESSION: 1. Compression fractures at L3 and L5, with abnormal lucency in the left side of the L5 vertebral body raising concern for multiple myeloma or lytic metastatic disease from a radiographic standpoint. MRI could be utilized for further characterization if clinically warranted. 2. There is also cortical thickening and irregularity of the S1 vertebra, underlying malignancy not excluded at S 1. 3. 30% compression fracture at L 3, age indeterminate.  4. Lumbar spondylosis. 5.  Aortoiliac atherosclerotic vascular disease. Electronically Signed   By: Van Clines M.D.   On: 05/10/2016 16:48   US Renal  05/10/2016  CLINICAL DATA:  Acute kidney injury. EXAM: RENAL / URINARY TRACT ULTRASOUND COMPLETE COMPARISON:  None. FINDINGS: Right Kidney: Length: 11.4 cm. Echogenicity within normal limits. No mass or hydronephrosis visualized. Left Kidney: Length: 11.6 cm. Echogenicity within normal limits. There is a 2.3 x 2.1 x 2.1 cm cyst in the upper pole. No fall with mass or hydronephrosis visualized. Bladder: Appears normal for degree of bladder distention. IMPRESSION: 1. Left renal cyst.  Otherwise unremarkable renal ultrasound. 2. No obstructive uropathy. Electronically Signed   By: Jeb Levering M.D.   On: 05/10/2016 04:29   Pelvis Portable  05/14/2016  CLINICAL DATA:  Left IM nail.  Postop pain. EXAM: PORTABLE PELVIS 1-2 VIEWS COMPARISON:  None. FINDINGS: Partially images the  hardware within the proximal left femur. Hip joints and SI joints are symmetric and unremarkable. No acute fracture, subluxation or dislocation. IMPRESSION: No acute bony abnormality. Electronically Signed   By: Rolm Baptise M.D.   On: 05/14/2016 15:37   Ir Fluoro Guide Cv Line Right  05/12/2016  INDICATION: 70 year old with renal failure.  Catheter needed for hemodialysis. EXAM: FLUOROSCOPIC AND ULTRASOUND GUIDED PLACEMENT OF A TUNNELED DIALYSIS CATHETER Physician: Stephan Minister. Anselm Pancoast, MD MEDICATIONS: Vancomycin 1 g; The antibiotic was administered within an appropriate time interval prior to skin puncture. ANESTHESIA/SEDATION: Versed 1.5 mg IV; Fentanyl 50 mcg IV; Moderate Sedation Time:  34 The patient was continuously monitored during the procedure by the interventional radiology nurse under my direct supervision. FLUOROSCOPY TIME:  Fluoroscopy Time: 36 seconds, 4 mGy COMPLICATIONS: None immediate. PROCEDURE: Informed consent was obtained for placement of a tunneled dialysis catheter. The patient was placed supine on the interventional table. Ultrasound confirmed a patent right internal jugular vein. Ultrasound images were obtained for documentation. The right side of the neck was prepped and draped in a sterile fashion. The right side of the neck was anesthetized with 1% lidocaine. Maximal barrier sterile technique was utilized including caps, mask, sterile gowns, sterile gloves, sterile drape, hand hygiene and skin antiseptic. A small incision was made with #11 blade scalpel. A 21 gauge needle directed into the right internal jugular vein with ultrasound guidance. A micropuncture dilator set was placed. A 19 cm tip to cuff Palindrome catheter was selected. The skin below the right clavicle was anesthetized and a small incision was made with an #11 blade scalpel. A subcutaneous tunnel was formed to the vein dermatotomy site. The catheter was brought through the tunnel. The vein dermatotomy site was dilated to  accommodate a peel-away sheath. The catheter was placed through the peel-away sheath and directed into the central venous structures. The tip of the catheter was placed at the superior cavoatrial junction with fluoroscopy. Fluoroscopic images were obtained for documentation. Both lumens were found to aspirate and flush well. The proper amount of heparin was flushed in both lumens. The vein dermatotomy site was closed using a single layer of absorbable suture and Dermabond. The catheter was secured to the skin using Prolene suture. Fluoroscopic and ultrasound images were taken and saved for documentation. IMPRESSION: Successful placement of a right jugular tunneled dialysis catheter using ultrasound and fluoroscopic guidance. Electronically Signed   By: Markus Daft M.D.   On: 05/12/2016 06:47   Ir US Guide Vasc Access Right  05/12/2016  INDICATION: 70 year old with renal failure.  Catheter needed for hemodialysis. EXAM:  FLUOROSCOPIC AND ULTRASOUND GUIDED PLACEMENT OF A TUNNELED DIALYSIS CATHETER Physician: Stephan Minister. Anselm Pancoast, MD MEDICATIONS: Vancomycin 1 g; The antibiotic was administered within an appropriate time interval prior to skin puncture. ANESTHESIA/SEDATION: Versed 1.5 mg IV; Fentanyl 50 mcg IV; Moderate Sedation Time:  34 The patient was continuously monitored during the procedure by the interventional radiology nurse under my direct supervision. FLUOROSCOPY TIME:  Fluoroscopy Time: 36 seconds, 4 mGy COMPLICATIONS: None immediate. PROCEDURE: Informed consent was obtained for placement of a tunneled dialysis catheter. The patient was placed supine on the interventional table. Ultrasound confirmed a patent right internal jugular vein. Ultrasound images were obtained for documentation. The right side of the neck was prepped and draped in a sterile fashion. The right side of the neck was anesthetized with 1% lidocaine. Maximal barrier sterile technique was utilized including caps, mask, sterile gowns, sterile  gloves, sterile drape, hand hygiene and skin antiseptic. A small incision was made with #11 blade scalpel. A 21 gauge needle directed into the right internal jugular vein with ultrasound guidance. A micropuncture dilator set was placed. A 19 cm tip to cuff Palindrome catheter was selected. The skin below the right clavicle was anesthetized and a small incision was made with an #11 blade scalpel. A subcutaneous tunnel was formed to the vein dermatotomy site. The catheter was brought through the tunnel. The vein dermatotomy site was dilated to accommodate a peel-away sheath. The catheter was placed through the peel-away sheath and directed into the central venous structures. The tip of the catheter was placed at the superior cavoatrial junction with fluoroscopy. Fluoroscopic images were obtained for documentation. Both lumens were found to aspirate and flush well. The proper amount of heparin was flushed in both lumens. The vein dermatotomy site was closed using a single layer of absorbable suture and Dermabond. The catheter was secured to the skin using Prolene suture. Fluoroscopic and ultrasound images were taken and saved for documentation. IMPRESSION: Successful placement of a right jugular tunneled dialysis catheter using ultrasound and fluoroscopic guidance. Electronically Signed   By: Markus Daft M.D.   On: 05/12/2016 06:47   Ir US Guide Bx Asp/drain  05/12/2016  INDICATION: 70 year old with renal failure.  Request for renal biopsy. EXAM: ULTRASOUND-GUIDED RENAL BIOPSY MEDICATIONS: None. ANESTHESIA/SEDATION: Patient was monitored by radiology nurse throughout the procedure. Sedation was continued from the prior hemodialysis catheter placement. FLUOROSCOPY TIME:  None COMPLICATIONS: None immediate. PROCEDURE: Informed written consent was obtained from the patient after a thorough discussion of the procedural risks, benefits and alternatives. All questions were addressed. Maximal Sterile Barrier Technique was  utilized including caps, mask, sterile gowns, sterile gloves, sterile drape, hand hygiene and skin antiseptic. A timeout was performed prior to the initiation of the procedure. Patient was placed prone. Both kidneys were evaluated with ultrasound. The right kidney was selected for biopsy. The right flank was prepped with chlorhexidine and a sterile field was created. Skin was anesthetized with 1% lidocaine. Using ultrasound guidance, 16 gauge core biopsy was obtained from the right kidney lower pole. Specimen placed in saline. A second ultrasound-guided core biopsy was obtained from the lower pole. Specimen was placed in saline. These samples were felt to be adequate. Bandage placed over the puncture site. FINDINGS: Two core biopsies obtained from the right kidney lower pole. Small perinephric hematoma identified following the procedure. This hematoma did not appear to be expanding based on post biopsy visualization. IMPRESSION: Ultrasound-guided core biopsies of the right kidney lower pole. Electronically Signed   By: Quita Skye  Anselm Pancoast M.D.   On: 05/12/2016 06:54   Ct Biopsy  05/16/2016  INDICATION: 70 year old male with a history of multiple myeloma. EXAM: CT BIOPSY MEDICATIONS: None. ANESTHESIA/SEDATION: Moderate (conscious) sedation was employed during this procedure. A total of Versed 1.0 mg and Fentanyl 50 mcg was administered intravenously. Moderate Sedation Time: 15 minutes. The patient's level of consciousness and vital signs were monitored continuously by radiology nursing throughout the procedure under my direct supervision. FLUOROSCOPY TIME:  CT COMPLICATIONS: None PROCEDURE: The procedure risks, benefits, and alternatives were explained to the patient. Questions regarding the procedure were encouraged and answered. The patient understands and consents to the procedure. Scout CT of the pelvis was performed for surgical planning purposes. The posterior pelvis was prepped with Betadinein a sterile fashion, and  a sterile drape was applied covering the operative field. A sterile gown and sterile gloves were used for the procedure. Local anesthesia was provided with 1% Lidocaine. We targeted the left posterior iliac bone for biopsy. The skin and subcutaneous tissues were infiltrated with 1% lidocaine without epinephrine. A small stab incision was made with an 11 blade scalpel, and an 11 gauge Murphy needle was advanced with CT guidance to the posterior cortex. Manual forced was used to advance the needle through the posterior cortex and the stylet was removed. A bone marrow aspirate was retrieved and passed to a cytotechnologist in the room. The Murphy needle was then advanced without the stylet for a core biopsy. The core biopsy was retrieved and also passed to a cytotechnologist. Manual pressure was used for hemostasis and a sterile dressing was placed. No complications were encountered no significant blood loss was encountered. Patient tolerated the procedure well and remained hemodynamically stable throughout. IMPRESSION: Status post CT-guided bone marrow biopsy, with tissue specimen sent to pathology for complete histopathologic analysis Signed, Dulcy Fanny. Earleen Newport, DO [facility] Electronically Signed   By: Corrie Mckusick D.O.   On: 05/16/2016 10:09   Dg Chest Port 1 View  05/24/2016  CLINICAL DATA:  Cough and congestion. EXAM: PORTABLE CHEST 1 VIEW COMPARISON:  05/17/2016. FINDINGS: Right IJ sheath in stable position. Mediastinum and hilar structures normal. Cardiomegaly with mild pulmonary venous congestion . Progressive left lower lobe infiltrate consistent with pneumonia and/or asymmetric pulmonary edema. Mild increased interstitial markings noted bilaterally. Persistent left pleural effusion. No pneumothorax. IMPRESSION: 1.  Right IJ sheath in stable position. 2. Cardiomegaly with pulmonary venous congestion and mild increase in interstitial markings noted  diffusely consistent with congestive heart failure. Progressive left lower lobe infiltrate consistent with pneumonia and/or asymmetric pulmonary edema. Small left pleural effusion. Electronically Signed   By: Marcello Moores  Register   On: 05/24/2016 07:24   Dg Chest Port 1 View  05/17/2016  CLINICAL DATA:  Postop fever, left femur surgery EXAM: PORTABLE CHEST 1 VIEW COMPARISON:  05/10/2016 FINDINGS: Cardiomediastinal silhouette is stable. There is dual lumen right IJ catheter with tip in SVC right atrium junction. No pneumothorax. There is small left pleural effusion left basilar atelectasis or infiltrate. No pulmonary edema. IMPRESSION: No pulmonary edema. Small left pleural effusion with left basilar atelectasis or infiltrate. Electronically Signed   By: Lahoma Crocker M.D.   On: 05/17/2016 08:27   Dg Bone Survey Met  05/12/2016  CLINICAL DATA:  Back pain and weakness. Evaluate for multiple myeloma and bone cancer. EXAM: METASTATIC BONE SURVEY COMPARISON:  05/10/2016 FINDINGS: Patient is edentulous. Subtle lucent areas in the calvarium are suspicious. Right jugular dialysis catheter tip in the  lower SVC. No suspicious lesions in the upper extremities. Multilevel degenerativee facet disease in the cervical spine. Normal alignment of cervical spine. Prevertebral soft tissues are normal. Normal alignment of the thoracic spine. There appears to be 6 non rib-bearing vertebral bodies. Assuming that S1 is a transitional vertebral body, there is a suspicious lucent lesion along the left side of the L5 vertebral body and pedicle. Again noted is mild vertebral body height loss at L5. There is a stable superior compression deformity of the L3 vertebral body. Concern expansion of the left L1 pedicle. Cannot exclude a lesion at this location. Again noted is an expansile lesion involving the left eighth rib. This is better visualized on prior chest radiograph. Pelvic radiograph demonstrate a large lucent lesion involving the left  iliac wing. This lesion measures up to 11 cm. Heterogeneous area of lucency in the midshaft of the left femur is concerning for a lesion. There is cortical thinning in this area. No suspicious lesions in the right lower extremity. IMPRESSION: Multiple bone lesions are suggestive for metastatic bone disease and suspicious for multiple myeloma. Largest lesion is involving the left iliac wing and this would be the most amenable to biopsy. There is also an expansile lesion involving the left eighth rib and a lucent lesion involving the L5 left pedicle. Question a lesion of the left L1 pedicle. Probable small calvarial lesions. Suspicious lucency involving the midshaft of the left femur with cortical thinning. This is concerning for a metastatic bone lesion and would be at risk for a pathologic fracture. L3 compression fracture.  Questionable compression fracture at L5. These results were called by telephone at the time of interpretation on 05/12/2016 at 4:47 pm to Dr. Clementeen Graham, who verbally acknowledged these results. Electronically Signed   By: Markus Daft M.D.   On: 05/12/2016 16:47   Dg C-arm 61-120 Min  05/14/2016  CLINICAL DATA:  Elective surgery.  Left IM nail. EXAM: DG C-ARM 61-120 MIN; LEFT FEMUR 2 VIEWS COMPARISON:  05/13/2016 FINDINGS: Placement of IM nail within the left femur, bridging the permeative lesions seen on prior plain films. Hip screw in place as well. No hardware or bony complicating feature. IMPRESSION: Placement of hip screw and left femoral IM nail as above. No visible complicating feature. Electronically Signed   By: Rolm Baptise M.D.   On: 05/14/2016 13:45   Mr Total Spine Mets Screening  05/16/2016  CLINICAL DATA:  Initial evaluation for metastatic disease, presumed multiple myeloma. EXAM: MRI TOTAL SPINE WITHOUT CONTRAST TECHNIQUE: Multisequence MR imaging of the spine from the cervical spine to the sacrum was performed prior to and following IV contrast administration for evaluation of  spinal metastatic disease. CONTRAST:  None. COMPARISON:  None. FINDINGS: Cervical Findings: Limited sagittal views of the cervical spine demonstrate a diffusely abnormal marrow pattern with multiple osseous lesions present, concerning for multiple myeloma. The most prevalent of these is present in the dens which demonstrates fairly diffuse abnormal STIR signal intensity (series 6, image 6). No significant extra osseous extension of tumor. No pathologic fracture. Scattered mild degenerative disc bulging throughout the cervical spine with posterior element hypertrophy. Associated diffuse canal narrowing without severe canal stenosis. No paraspinous soft tissue mass appreciated. Thoracic Findings: Limited sagittal views of the thoracic spine demonstrate diffusely abnormal marrow pattern with multiple osseous lesions present, again highly concerning for multiple myeloma. Most prominent lesion present within the T3 vertebral body and measures 19 mm. No significant extra osseous extension of tumor identified on this limited exam.  No associated pathologic fracture. No paraspinous soft tissue mass appreciated. Lumbar Findings: There are 6 non rib-bearing lumbar type vertebral bodies. The lowest vertebral body is labeled L6. Vertebral body count is made from the odontoid week. Limited sagittal views of the lumbar spine demonstrate diffusely abnormal marrow pattern with innumerable osseous lesions present, highly concerning for multiple myeloma. These are present throughout the lumbar spine and sacrum. There is associated probable pathologic fracture involving the L3 vertebral body with approximately 50% of central height loss. No significant bony retropulsion. Mild compression deformity at the superior endplate of L2, likely related to chronic Schmorl's node. Large metastatic/myelomatous lesion involves the L5 vertebral body with extension into the left posterior elements. This lesion completely involves the central and left  aspect of the L5 vertebral body with destruction of the left posterior elements and extension into the paraspinous soft tissues. Lesion measures approximately 6.0 x 9.0 x 7.5 cm (series 12, image 10). Abnormal posterior convex bowing of the L5 vertebral body with moderate to severe canal stenosis (series 10, image 7). There is extra osseous extension of tumor into the adjacent left L4-5 and L5-6 neural foramina with secondary mild narrowing at L4-5 and more moderate to severe narrowing at L5-6 (series 9, image 12). A second large ovoid lesion involving the left iliac wing is partially visualized (series 12, image 13). No other definite paraspinous soft tissues mass. T2 hyperintense cyst noted within the left kidney. IMPRESSION: 1. 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 Careful correlation to the vertebral body count on this exam is recommended prior to any potential future intervention. 2. Diffusely abnormal marrow signal intensity with innumerable osseous lesions present throughout the spine, concerning for multiple myeloma given the provided history. 3. Large 6.0 x 9.0 x 7.5 cm lesion with associated extraosseous extension of tumor into the left paraspinous soft tissues at L5. Abnormal posterior convex bowing of the L5 vertebral body with moderate to severe canal stenosis. Neural foraminal encroachment on the left at L4-5 and L5-6 as well. No other significant canal or neural foraminal encroachment within the lumbar spine. 4. No significant extraosseous tumor or or spinal canal encroachment within the cervical and thoracic spine. 5. Compression deformity of L3 with up to 50% height loss, which may be pathologic in nature. No bony retropulsion. 6. Additional large lesion with extraosseous extension of tumor involving the left iliac wing, incompletely visualized on this exam. Electronically Signed   By: Jeannine Boga M.D.   On: 05/16/2016 00:51   Dg Femur Min 2  Views Left  05/14/2016  CLINICAL DATA:  Elective surgery.  Left IM nail. EXAM: DG C-ARM 61-120 MIN; LEFT FEMUR 2 VIEWS COMPARISON:  05/13/2016 FINDINGS: Placement of IM nail within the left femur, bridging the permeative lesions seen on prior plain films. Hip screw in place as well. No hardware or bony complicating feature. IMPRESSION: Placement of hip screw and left femoral IM nail as above. No visible complicating feature. Electronically Signed   By: Rolm Baptise M.D.   On: 05/14/2016 13:45   Dg Femur Min 2 Views Left  05/13/2016  CLINICAL DATA:  Left femur pain anteriorly from the left hip to the knee. EXAM: LEFT FEMUR 2 VIEWS COMPARISON:  Bone survey 05/12/2016 FINDINGS: There is permeative lucency within the midshaft of the femur with ill-defined margins and cortical involvement as described on yesterday's study. This lesion involves a length of at least 6 cm. No displaced fracture is identified. The left hip and left  knee are located. No soft tissue abnormality is seen. IMPRESSION: Permeative left midshaft femur lesion concerning for malignancy as described on yesterday's study. No fracture identified. Electronically Signed   By: Logan Bores M.D.   On: 05/13/2016 11:40   Dg Femur Port Min 2 Views Left  05/14/2016  CLINICAL DATA:  IM nail placement.  Postop pain. EXAM: LEFT FEMUR PORTABLE 2 VIEWS COMPARISON:  05/13/2016 FINDINGS: Placement of intra medullary nail and femoral neck screw. The IM nail crosses the herniated area within the mid shaft. There is a cortical defect noted laterally, new since prior study, possibly biopsy site. No additional interval change since prior study. IMPRESSION: Intra medullary nail placed across the permeative lesion in the mid left femur. Cortical defect laterally presumably represents biopsy site. Electronically Signed   By: Rolm Baptise M.D.   On: 05/14/2016 15:38    Time Spent in minutes  25   Louellen Molder M.D on 05/24/2016 at 4:39 PM  Between 7am to 7pm -  Pager - [phone]  After 7pm go to [email or web address] - password [facility]  [facility] -  Office  [phone]

## 2016-05-24 NOTE — Progress Notes (Signed)
[facility] ROUNDING NOTE   Subjective:   Interval History: Going to OR for fistula today   Objective:  Vital signs in last 24 hours:  Temp:  [98 F (36.7 C)-99.6 F (37.6 C)] 98.6 F (37 C) (06/14 0942) Pulse Rate:  [75-94] 80 (06/14 1000) Resp:  [17-20] 19 (06/14 1000) BP: (107-145)/(52-64) 118/58 mmHg (06/14 1000) SpO2:  [95 %-100 %] 95 % (06/14 1000) Weight:  [72.893 kg (160 lb 11.2 oz)-74.3 kg (163 lb 12.8 oz)] 72.893 kg (160 lb 11.2 oz) (06/13 2008)  Weight change: -1.814 kg (-4 lb) Filed Weights   05/23/16 0647 05/23/16 1017 05/23/16 2008  Weight: 76.3 kg (168 lb 3.4 oz) 74.3 kg (163 lb 12.8 oz) 72.893 kg (160 lb 11.2 oz)    Intake/Output: I/O last 3 completed shifts: In: [id number] [P.O.:495] Out: 2725 [Urine:400; Emesis/NG output:325; Other:2000]   Intake/Output this shift:  Total I/O In: 300 [I.V.:300] Out: 5 [Blood:5]  IN:[id number] and alert CVS: RRR Resp: clear Abd:+ BS NTND Ext:No edema Decreased ROM Lt leg NEURO:CNI Ox3 no asterixis Rt IJ PC   Basic Metabolic Panel:  Recent Labs Lab 05/21/16 0507 05/22/16 0446 05/23/16 0706 05/23/16 2331 05/24/16 0648  NA 131* 130* 129* 130* 135  K 3.4* 3.5 3.6 4.1 4.1  CL 96* 96* 94* 97* 103  CO2 27 26 25 27 26   GLUCOSE 131* 111* 110* 137* 104*  BUN 22* 32* 42* 22* 24*  CREATININE 3.74* 5.05* 6.38* 4.07* 4.43*  CALCIUM 7.8* 7.9* 8.3* 7.9* 7.5*  PHOS  --   --  4.6  --   --     Liver Function Tests:  Recent Labs Lab 05/19/16 0604 05/20/16 0740 05/21/16 0507 05/22/16 0446 05/23/16 0706 05/24/16 0648  AST 20 26 27 22   --  19  ALT 13* 16* 23 20  --  16*  ALKPHOS 55 64 72 72  --  72  BILITOT 1.2 1.2 1.2 1.2  --  1.8*  PROT 7.4 7.2 7.2 7.0  --  7.2  ALBUMIN 1.8* 1.9* 1.8* 1.9* 1.8* 1.9*   No results for input(s): LIPASE, AMYLASE in the last 168 hours. No results for input(s): AMMONIA in the last 168 hours.  CBC:  Recent Labs Lab 05/18/16 0330 05/20/16 0937 05/23/16 0707  05/24/16 0648  WBC 5.5 5.5 7.5 4.8  NEUTROABS  --   --   --  3.6  HGB 8.4* 8.3* 8.4* 8.1*  HCT 25.3* 24.6* 25.6* 25.5*  MCV 87.5 85.7 86.8 89.8  PLT 107* 99* 78* 70*    Cardiac Enzymes: No results for input(s): CKTOTAL, CKMB, CKMBINDEX, TROPONINI in the last 168 hours.  BNP: Invalid input(s): POCBNP  CBG: No results for input(s): GLUCAP in the last 168 hours.  Microbiology: Results for orders placed or performed during the hospital encounter of 05/09/16  Surgical pcr screen     Status: None   Collection Time: 05/14/16  1:00 AM  Result Value Ref Range Status   MRSA, PCR NEGATIVE NEGATIVE Final   Staphylococcus aureus NEGATIVE NEGATIVE Final    Comment:        The Xpert SA Assay (FDA approved for NASAL specimens in patients over 67 years of age), is one component of a comprehensive surveillance program.  Test performance has been validated by [facility] for patients greater than or equal to 76 year old. It is not intended to diagnose infection nor to guide or monitor treatment.   Culture, blood (routine x 2)  Status: None   Collection Time: 05/16/16  8:05 PM  Result Value Ref Range Status   Specimen Description BLOOD HEMODIALYSIS CATHETER  Final   Special Requests BOTTLES DRAWN AEROBIC AND ANAEROBIC 10CC  Final   Culture NO GROWTH 5 DAYS  Final   Report Status 05/21/2016 FINAL  Final  Culture, blood (routine x 2)     Status: None   Collection Time: 05/16/16  8:15 PM  Result Value Ref Range Status   Specimen Description BLOOD HEMODIALYSIS CATHETER  Final   Special Requests BOTTLES DRAWN AEROBIC AND ANAEROBIC 10CC  Final   Culture NO GROWTH 5 DAYS  Final   Report Status 05/21/2016 FINAL  Final  Culture, Urine     Status: Abnormal   Collection Time: 05/21/16  3:31 PM  Result Value Ref Range Status   Specimen Description URINE, RANDOM  Final   Special Requests NONE  Final   Culture MULTIPLE SPECIES PRESENT, SUGGEST RECOLLECTION (A)  Final   Report Status  05/23/2016 FINAL  Final    Coagulation Studies: No results for input(s): LABPROT, INR in the last 72 hours.  Urinalysis:  Recent Labs  05/21/16 1531  COLORURINE YELLOW  LABSPEC 1.008  PHURINE 7.5  GLUCOSEU NEGATIVE  HGBUR LARGE*  BILIRUBINUR NEGATIVE  KETONESUR NEGATIVE  PROTEINUR 100*  NITRITE NEGATIVE  LEUKOCYTESUR NEGATIVE      Imaging: Dg Chest Port 1 View  05/24/2016  CLINICAL DATA:  Cough and congestion. EXAM: PORTABLE CHEST 1 VIEW COMPARISON:  05/17/2016. FINDINGS: Right IJ sheath in stable position. Mediastinum and hilar structures normal. Cardiomegaly with mild pulmonary venous congestion . Progressive left lower lobe infiltrate consistent with pneumonia and/or asymmetric pulmonary edema. Mild increased interstitial markings noted bilaterally. Persistent left pleural effusion. No pneumothorax. IMPRESSION: 1.  Right IJ sheath in stable position. 2. Cardiomegaly with pulmonary venous congestion and mild increase in interstitial markings noted diffusely consistent with congestive heart failure. Progressive left lower lobe infiltrate consistent with pneumonia and/or asymmetric pulmonary edema. Small left pleural effusion. Electronically Signed   By: Marcello Moores  Register   On: 05/24/2016 07:24     Medications:   . sodium chloride    . sodium chloride     . [MAR Hold] sodium chloride   Intravenous Once  . [MAR Hold] sodium chloride   Intravenous Once  . [MAR Hold] acyclovir  200 mg Oral BID  . [MAR Hold] darbepoetin (ARANESP) injection - DIALYSIS  100 mcg Intravenous Q Sat-HD  . [MAR Hold] dexamethasone  40 mg Oral Weekly  . [MAR Hold] docusate sodium  100 mg Oral BID  . [MAR Hold] feeding supplement (NEPRO CARB STEADY)  237 mL Oral BID BM  . [MAR Hold] multivitamin  1 tablet Oral QHS  . [MAR Hold] polyethylene glycol  17 g Oral BID  . [MAR Hold] senna-docusate  2 tablet Oral BID  . [MAR Hold] vitamin B-12  1,000 mcg Oral Daily   [MAR Hold] acetaminophen **OR** [MAR  Hold] acetaminophen, [MAR Hold] alum & mag hydroxide-simeth, [MAR Hold] bisacodyl, [MAR Hold] guaiFENesin-dextromethorphan, [MAR Hold] HYDROcodone-acetaminophen, [MAR Hold] menthol-cetylpyridinium **OR** [MAR Hold] phenol, [MAR Hold] methocarbamol, [MAR Hold] metoCLOPramide **OR** [MAR Hold] metoCLOPramide (REGLAN) injection, [MAR Hold] ondansetron **OR** [MAR Hold] ondansetron (ZOFRAN) IV  Assessment/ Plan:  1. 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 Will put on TTS schedule  Has dialysis scheduled for AM    Placing fistula  2. MM on Velcade 3. Comp fx Lt femur SP intramedullary fixation 4. L5 mass Plan for radiation 5. Anemia  on aranesp    LOS: 14 Felipa Laroche W @TODAY @10 :08 AM

## 2016-05-24 NOTE — Anesthesia Procedure Notes (Signed)
Procedure Name: MAC Date/Time: 05/24/2016 8:33 AM Performed by: Jenne Campus Pre-anesthesia Checklist: Patient identified, Emergency Drugs available, Suction available, Patient being monitored and Timeout performed Patient Re-evaluated:Patient Re-evaluated prior to inductionOxygen Delivery Method: Simple face mask

## 2016-05-24 NOTE — Anesthesia Postprocedure Evaluation (Signed)
Anesthesia Post Note  Patient: Thomas [name]  Procedure(s) Performed: Procedure(s) (LRB): ARTERIOVENOUS (AV) FISTULA CREATION-LEFT (Left)  Patient location during evaluation: PACU Anesthesia Type: MAC Level of consciousness: awake and alert Pain management: pain level controlled Vital Signs Assessment: post-procedure vital signs reviewed and stable Respiratory status: spontaneous breathing, nonlabored ventilation, respiratory function stable and patient connected to nasal cannula oxygen Cardiovascular status: stable and blood pressure returned to baseline Anesthetic complications: no    Last Vitals:  Filed Vitals:   05/24/16 0945 05/24/16 1000  BP: 107/59 118/58  Pulse: 75 80  Temp:    Resp: 20 19    Last Pain:  Filed Vitals:   05/24/16 1000  PainSc: 0-No pain                 Zenaida Deed

## 2016-05-24 NOTE — Anesthesia Preprocedure Evaluation (Addendum)
Anesthesia Evaluation  Patient identified by MRN, date of birth, ID band Patient awake    Reviewed: Allergy & Precautions, NPO status , Patient's Chart, lab work & pertinent test results  History of Anesthesia Complications Negative for: history of anesthetic complications  Airway Mallampati: II  TM Distance: >3 FB Neck ROM: Full    Dental  (+) Upper Dentures, Lower Dentures, Dental Advisory Given   Pulmonary former smoker,    Pulmonary exam normal breath sounds clear to auscultation       Cardiovascular  Rhythm:Regular Rate:Normal     Neuro/Psych negative neurological ROS     GI/Hepatic negative GI ROS,   Endo/Other  negative endocrine ROS  Renal/GU Dialysis and ESRFRenal diseaseHd Tues 6/13     Musculoskeletal   Abdominal   Peds  Hematology  (+) anemia ,   Anesthesia Other Findings   Reproductive/Obstetrics negative OB ROS                            Anesthesia Physical Anesthesia Plan  ASA: IV  Anesthesia Plan: MAC   Post-op Pain Management:    Induction: Intravenous  Airway Management Planned: Nasal Cannula  Additional Equipment:   Intra-op Plan:   Post-operative Plan:   Informed Consent: I have reviewed the patients History and Physical, chart, labs and discussed the procedure including the risks, benefits and alternatives for the proposed anesthesia with the patient or authorized representative who has indicated his/her understanding and acceptance.   Dental advisory given  Plan Discussed with:   Anesthesia Plan Comments:         Anesthesia Quick Evaluation

## 2016-05-24 NOTE — Progress Notes (Signed)
Thomas [name]   DOB:1946/07/31   YI#:[id number]    Subjective: He is seen with family members. His wife is not present. He is tolerating dialysis well apart from progressive fatigue and nausea with each dialysis episode. Yesterday, he had a good day and was able to get out of bed and sat on the chair and walk short distance with a cane without much back pain. He was able to mobilize short distances. He denies worsening back pain. Constipation had resolved. He tolerated Velcade well. He had mild rash but is not causing any trouble. He denies new neurological deficit. He has AV fistula creation today. According to him, he was making around 700 mL of urine for the last 2 days He complained of nonproductive cough but denies fever or chills.   Objective:  Filed Vitals:   05/24/16 1015 05/24/16 1027  BP: 133/63 119/58  Pulse: 80 78  Temp: 98 F (36.7 C) 98.2 F (36.8 C)  Resp:  18     Intake/Output Summary (Last 24 hours) at 05/24/16 1406 Last data filed at 05/24/16 1000  Gross per 24 hour  Intake   1002 ml  Output    405 ml  Net    597 ml    GENERAL:alert, no distress and comfortable SKIN: skin color, texture, turgor are normal, no rashes or significant lesions. Faint rash from prior injection EYES: normal, Conjunctiva are pale and non-injected, sclera clear Musculoskeletal:no cyanosis of digits and no clubbing  NEURO: alert & oriented x 3 with fluent speech, no focal motor/sensory deficits   Labs:  Lab Results  Component Value Date   WBC 4.8 05/24/2016   HGB 8.1* 05/24/2016   HCT 25.5* 05/24/2016   MCV 89.8 05/24/2016   PLT 70* 05/24/2016   NEUTROABS 3.6 05/24/2016    Lab Results  Component Value Date   NA 135 05/24/2016   K 4.1 05/24/2016   CL 103 05/24/2016   CO2 26 05/24/2016    Studies:  Dg Chest Port 1 View  05/24/2016  CLINICAL DATA:  Cough and congestion. EXAM: PORTABLE CHEST 1 VIEW COMPARISON:  05/17/2016. FINDINGS: Right IJ sheath in stable position. Mediastinum  and hilar structures normal. Cardiomegaly with mild pulmonary venous congestion . Progressive left lower lobe infiltrate consistent with pneumonia and/or asymmetric pulmonary edema. Mild increased interstitial markings noted bilaterally. Persistent left pleural effusion. No pneumothorax. IMPRESSION: 1.  Right IJ sheath in stable position. 2. Cardiomegaly with pulmonary venous congestion and mild increase in interstitial markings noted diffusely consistent with congestive heart failure. Progressive left lower lobe infiltrate consistent with pneumonia and/or asymmetric pulmonary edema. Small left pleural effusion. Electronically Signed   By: Marcello Moores  Register   On: 05/24/2016 07:24    Assessment & Plan:   IgG kappa multiple myeloma I had long discussion with the patient and family members regarding 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 The patient presented with significant bone lesions, renal failure and pancytopenia. Further testing from cytogenetics and FISH anaylsis from recent bone marrow biopsy is pending but biopsy of his bone from the femur confirmed diagnosis of multiple myeloma. He was started on weekly pulse dose dexamethasone 40 mg on 05/18/16. He was started on Velcade subcutaneous injections on days 1,4,8,11 on 05/18/16. I tried to avoid weekend injection. Next dose (day 8 due on 05/25/16) I will get chemotherapy certified nursing staff from [place] to proceed with Velcade injection at [facility] So far he tolerated treatment well apart from very mild skin rash which  is expected. 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 There is no contraindication for him to receive dexamethasone or Velcade  while on hemodialysis as they are not renally cleared Hopefully, his hemodialysis treatment can be transitioned to outpatient treatment  Significant bone lesions throughout He had prophylactic surgery to the left femur. MRI shows significant lesion in the lumbar spine. He was seen by radiation oncologist with plan for radiation therapy in a palliative fashion to the femur and the lumbar spine. Non-urgent His pain is currently well controlled with current pain medication regimen.  Severe pancytopenia, vitamin B-12 deficiency Multifactorial, likely due to multiple myeloma and severe vitamin B-12 deficiency and renal failure I agreed for him to receive blood transfusion to keep hemoglobin greater than 7, He is receiving Aranesp I have added one injection of vitamin B 12 on 05/18/16. He will continue high-dose supplement daily by mouth OK to proceed with treatment as long as platelet count is >50,000  Severe constipation, resolved Multifactorial likely due to dehydration and medication side effects This has resolved with aggressive laxative therapy  Severe vitamin D deficiency I started him on high-dose replacement therapy, he will get high dose vitamin D every Thursday  Severe protein calorie malnutrition Continue dietitian consult and frequent small meals  Cough This is nonproductive. He has no evidence of recent leukocytosis. Chest x-ray confirmed atelectasis. Recommend incentive spirometry and out of bed as much as possible  DVT prophylaxis I recommend heparin SQ as long as platelet count is >50,000  Discharge planning Unknown. Apparently, there is plan for possible inpatient rehabilitation.  I will return on Monday morning 05/29/16 to check on him before day 11 treatment Hopefully, we can transition his future treatment to outpatient  [facility] [place], Thomas Irmen, MD 05/24/2016  2:06 PM

## 2016-05-24 NOTE — Progress Notes (Signed)
OT Cancellation Note  Patient Details Name: Thomas [name] MRN: [id number] DOB: [birth year]   Cancelled Treatment:    Reason Eval/Treat Not Completed: Patient at procedure or test/ unavailable (OR for fistula creation). Will check back as schedule allows and as appropriate for OT treatment.   Chrys Racer , MS, OTR/L, CLT  Pager: [phone]  05/24/2016, 8:33 AM

## 2016-05-24 NOTE — Interval H&P Note (Signed)
History and Physical Interval Note:  05/24/2016 8:29 AM  Thomas [name]  has presented today for surgery, with the diagnosis of End Stage Renal Disease N18.6  The various methods of treatment have been discussed with the patient and family. After consideration of risks, benefits and other options for treatment, the patient has consented to  Procedure(s): ARTERIOVENOUS (AV) FISTULA CREATION-LEFT (Left) as a surgical intervention .  The patient's history has been reviewed, patient examined, no change in status, stable for surgery.  I have reviewed the patient's chart and labs.  Questions were answered to the patient's satisfaction.     Adele Barthel

## 2016-05-24 NOTE — Transfer of Care (Signed)
Immediate Anesthesia Transfer of Care Note  Patient: Thomas [name]  Procedure(s) Performed: Procedure(s): ARTERIOVENOUS (AV) FISTULA CREATION-LEFT (Left)  Patient Location: PACU  Anesthesia Type:MAC  Level of Consciousness: awake, oriented and patient cooperative  Airway & Oxygen Therapy: Patient Spontanous Breathing and Patient connected to face mask oxygen  Post-op Assessment: Report given to RN and Post -op Vital signs reviewed and stable  Post vital signs: Reviewed  Last Vitals:  Filed Vitals:   05/23/16 2008 05/24/16 0455  BP: 125/52 145/59  Pulse: 89 83  Temp: 37.2 C 36.7 C  Resp: 17 17    Last Pain:  Filed Vitals:   05/24/16 0456  PainSc: 0-No pain      Patients Stated Pain Goal: 0 ([id number])  Complications: No apparent anesthesia complications

## 2016-05-24 NOTE — Op Note (Signed)
OPERATIVE NOTE   PROCEDURE: 1. left first stage brachial vein transposition (brachiobrachial arteriovenous fistula) placement  PRE-OPERATIVE DIAGNOSIS: acute on chronic renal disease  POST-OPERATIVE DIAGNOSIS: same as above   SURGEON: Adele Barthel, MD  ASSISTANT(S): Silva Bandy, PAC   ANESTHESIA: local and MAC  ESTIMATED BLOOD LOSS: 50 cc  FINDING(S): 1. No viable cephalic vein 2. Cubital vein draining into brachobasilic venous system  SPECIMEN(S):  none  INDICATIONS:   Thomas [name] is a 70 y.o. male who presents with acute on chronic renal failure.  The patient is scheduled for left arteriovenous fistula placement.  The patient is aware the risks include but are not limited to: bleeding, infection, steal syndrome, nerve damage, ischemic monomelic neuropathy, failure to mature, and need for additional procedures.  The patient is aware of the risks of the procedure and elects to proceed forward.  DESCRIPTION: After full informed written consent was obtained from the patient, the patient was brought back to the operating room and placed supine upon the operating table.  Prior to induction, the patient received IV antibiotics.   After obtaining adequate anesthesia, the patient was then prepped and draped in the standard fashion for a left arm access procedure.  I turned my attention first to identifying the patient's brachial vein, cephalic vein, basilic vein, nd brachial artery.  Using SonoSite guidance, the location of these vessels were marked out on the skin except for the cephalic vein, which could not be visualized.  The best looking vein was a cubital vein.  At this point, I injected local anesthetic to obtain a field block of the antecubitum.  In total, I injected about 2 mL of 1% lidocaine without epinephrine.  I made a transverse incision at the level of the antecubitum and dissected through the subcutaneous tissue and fascia to gain exposure of the brachial artery.  This was  noted to be 3-3.5 mm in diameter externally.  This was dissected out proximally and distally and controlled with vessel loops .  I then dissected out the cubital vein which was the largest vein in the antecubitum.  This was noted to be 3-4 mm in diameter externally.  The distal segment of the vein was ligated with a  2-0 silk, and the vein was transected.  The proximal segment was interrogated with serial dilators.  The vein accepted up to a 4 mm dilator without any difficulty.  The vein was found to be draining into the brachiobasilic vein system.  I then instilled the heparinized saline into the vein and clamped it.  At this point, I reset my exposure of the brachial artery and placed the artery under tension proximally and distally.  I made an arteriotomy with a #11 blade, and then I extended the arteriotomy with a Potts scissor.  I injected heparinized saline proximal and distal to this arteriotomy.  The vein was then sewn to the artery in an end-to-side configuration with a running stitch of 6-0 Prolene.  Prior to completing this anastomosis, I allowed the vein and artery to backbleed.  There was no evidence of clot from any vessels.  I completed the anastomosis in the usual fashion and then released all vessel loops and clamps.  There was a palpable thrill in the venous outflow, and there was a palpable radial pulse.  At this point, I irrigated out the surgical wound.  There was no further active bleeding.  The subcutaneous tissue was reapproximated with a running stitch of 3-0 Vicryl.  The skin  was then reapproximated with a running subcuticular stitch of 4-0 Vicryl.  The skin was then cleaned, dried, and reinforced with Dermabond.  The patient tolerated this procedure well.    COMPLICATIONS: none  CONDITION: stable  Adele Barthel, MD [facility] [place] Office: [phone] Pager: [phone]  05/24/2016, 9:29 AM

## 2016-05-24 NOTE — Progress Notes (Signed)
PT Cancellation Note  Patient Details Name: Thomas [name] MRN: [id number] DOB: Apr 28, 1946   Cancelled Treatment:    Reason Eval/Treat Not Completed: Patient at procedure or test/unavailable   OR for fistula creation;   Will follow up later today as time allows;  Otherwise, will follow up for PT tomorrow;   Thank you,  Roney Marion, PT  Acute Rehabilitation Services Pager [phone] Office [phone]     Roney Marion [facility] 05/24/2016, 8:07 AM

## 2016-05-24 NOTE — H&P (View-Only) (Signed)
Hospital Consult    Reason for Consult:  In need of dialysis access Referring Physician:  Mercy Moore MRN #:  [id number]  History of Present Illness: This is a 70 y.o. male who has had a hx of nausea and vomiting for about a month.  He has had some back pain and was taking high doses of NSAIDS.  He did present to the urgent care and labs were drawn and he was sent to the ER.   There he was found to be in renal failure with his creatinine >10 with mild hyperkalemia.    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 He has since underwent open bx of the left femur and intramedullary fixation of teh left femur.  This turned out to be multiple myeloma.  He is now being followed by radiation oncology.  Given his renal failure, we are asked to evaluate for permanent access. The pt is right handed.  Past Medical History  Diagnosis Date  . Compression fracture     Past Surgical History  Procedure Laterality Date  . Mouth surgery    . Femur im nail Left 05/14/2016    Procedure: INTRAMEDULLARY (IM) NAIL FEMORAL;  Surgeon: Rod Can, MD;  Location: [place];  Service: Orthopedics;  Laterality: Left;  . Bone biopsy Left 05/14/2016    Procedure: LEFT FEMORAL BIOPSY WITH INTRAOPERATIVE FROZEN SECTIONS ;  Surgeon: Rod Can, MD;  Location: [place];  Service: Orthopedics;  Laterality: Left;    Allergies  Allergen Reactions  . Dilaudid [Hydromorphone Hcl] Other (See Comments)    hallucinations  . Codeine Other (See Comments)    "MAKES ME JUMPY"  . Penicillins     Has patient had a PCN reaction causing immediate rash, facial/tongue/throat swelling, SOB or lightheadedness with hypotension: Yes Has patient had a PCN reaction causing severe rash involving mucus membranes or skin necrosis: Yes Has patient had a PCN reaction that  required hospitalization: No Has patient had a PCN reaction occurring within the last 10 years: No If all of the above answers are "NO", then may proceed with Cephalosporin use.     Prior to Admission medications   Not on File    Social History   Social History  . Marital Status: Married    Spouse Name: N/A  . Number of Children: N/A  . Years of Education: N/A   Occupational History  . Not on file.   Social History Main Topics  . Smoking status: Former Research scientist (life sciences)  . Smokeless tobacco: Not on file  . Alcohol Use: No  . Drug Use: No  . Sexual Activity: Not on file   Other Topics Concern  . Not on file   Social History Narrative     Family History  Problem Relation Age of Onset  . Diabetes Father     ROS: _0  Positive   _1  Negative   _2  All sytems reviewed and are negative  Cardiovascular: _3  chest pain/pressure _4  palpitations _5  SOB lying flat _6  DOE _7  pain in legs while walking _8  pain in legs at rest _9  pain in legs at night _10  non-healing ulcers _11  hx of DVT _12  swelling in legs  Pulmonary: _13  productive cough _14  asthma/wheezing _15  home O2  Neurologic: _16  weakness in _17  arms _18  legs _19  numbness in _20  arms _21  legs _22  hx of CVA _23   mini stroke _0 difficulty speaking or slurred speech _1  temporary loss of vision in one eye _2  dizziness  Hematologic: _3  hx of cancer-newly diagnosed multiple myeloma _4  bleeding problems _5  problems with blood clotting easily  Endocrine:   _6  diabetes _7  thyroid disease  GI _8  vomiting blood _9  blood in stool  GU: _10  CKD/renal failure _11  HD--_12  M/W/F or _13  T/T/S _14  burning with urination _15  blood in urine  Psychiatric: _16  anxiety _17  depression  Musculoskeletal: _18  arthritis _19  joint pain _20  back pain _21  compression fx  Integumentary: _22  rashes _23  ulcers  Constitutional: _24  fever _25  chills _26  N/V _27  weight loss  Physical Examination  Filed Vitals:   05/18/16 0930 05/18/16 1000  BP:  114/56 124/62  Pulse: 81 84  Temp:    Resp: 24 21   Body mass index is 22.37 kg/(m^2).  General:  WDWN in NAD Gait: Not observed Eyes: PERRLA, EOMI HENT: WNL, normocephalic; dry mucous membranes Pulmonary: normal non-labored breathing, without Rales, rhonchi,  wheezing Cardiac: regular, without  Murmurs, rubs or gallops; without carotid bruits Abdomen:  soft, NT/ND, no masses Skin: without rashes Vascular Exam/Pulses:  Right Left  Radial 2+ (normal) 2+ (normal)  Ulnar 2+ (normal) 2+ (normal)  DP Unable to palpate 1+ (weak)  PT 2+ (normal) 2+ (normal)   Extremities: bandaged left groin; sensation and motor in tact lower extremities. Neurologic: A&O X 3; Appropriate Affect ; SENSATION: normal; MOTOR FUNCTION:  moving all extremities equally. Speech is fluent/normal Psychiatric: Judgment intact, Mood & affect appropriate for pt's clinical situation Lymph : No Cervical, Axillary, or Inguinal lymphadenopathy     CBC    Component Value Date/Time   WBC 5.5 05/18/2016 0330   RBC 2.89* 05/18/2016 0330   HGB 8.4* 05/18/2016 0330   HCT 25.3* 05/18/2016 0330   HCT 22.8* 05/10/2016 1156   PLT 107* 05/18/2016 0330   MCV 87.5 05/18/2016 0330   MCH 29.1 05/18/2016 0330   MCHC 33.2 05/18/2016 0330   RDW 16.0* 05/18/2016 0330   LYMPHSABS 1.1 05/09/2016 2144   MONOABS 0.6 05/09/2016 2144   EOSABS 0.1 05/09/2016 2144   BASOSABS 0.0 05/09/2016 2144    BMET    Component Value Date/Time   NA 129* 05/18/2016 0330   K 4.0 05/18/2016 0330   CL 94* 05/18/2016 0330   CO2 28 05/18/2016 0330   GLUCOSE 97 05/18/2016 0330   BUN 22* 05/18/2016 0330   CREATININE 5.75* 05/18/2016 0330   CALCIUM 7.9* 05/18/2016 0330   GFRNONAA 9* 05/18/2016 0330   GFRAA 11* 05/18/2016 0330    COAGS: Lab Results  Component Value Date   INR 1.46 05/11/2016   INR 1.40 05/10/2016     Non-Invasive Vascular Imaging:   BUE Vein mapping 06/10/05:  Right Cephalic AC: 2.69 Above AC: 4.85  Left  Cephalic AC: 4.62  Right Basilic   Segment Diameter Depth Comment  1. Prox upper arm 5.35m 8.937m  2. Mid upper arm 5.8447m.25m57m3. Above AC 4[facility]2mm14m3mm 73mch  4. In AC 5.3[facility] m58m5. Below AC 3.77mm 3.47m bra56m   Left Basilic  Segment Diameter Depth Comment  1. Prox upper arm 4.66mm 7.7761m 2. 29mupper arm 3.62mm 3.79mm10mnch 36mAbove AC 3.24mm 3.44mm  35mIn A14m2mm mm   5. Bel42mAC 3.33mm 4.6mm branc28m    1mtatin:  No. Beta Blocker:  No. Aspirin:  No. ACEI:  No. ARB:  No. Other antiplatelets/anticoagulants:  No.    ASSESSMENT/PLAN: This is a 70 y.o. male with newly diagnosed multiple myeloma and renal failure in need of permanent dialysis access.   -the pt is right handed and based on vein mapping, he would be a candidate for a left basilic vein transposition, which would most likely be done in two stages. Dr. Bridgett Larsson has discussed this with the pt.  -Will discuss timing of surgery with Dr. Bridgett Larsson.   Leontine Locket, PA-C [facility] [phone]  Addendum  I have independently interviewed and examined the patient, and I agree with the physician assistant's findings.  Will plan on L arm fistula placement: L RC vs BC AVF vs BVT on Wednesday.  No OR space before Wednesday.   - will discuss further plans with pt once scheduled   Adele Barthel, MD [facility] [place] Office: [phone] Pager: [phone]  05/18/2016, 11:19 AM

## 2016-05-25 ENCOUNTER — Encounter (HOSPITAL_COMMUNITY): Payer: Self-pay | Admitting: Vascular Surgery

## 2016-05-25 ENCOUNTER — Other Ambulatory Visit (HOSPITAL_COMMUNITY): Payer: PPO

## 2016-05-25 LAB — RENAL FUNCTION PANEL
Albumin: 1.8 g/dL — ABNORMAL LOW (ref 3.5–5.0)
Anion gap: 6 (ref 5–15)
BUN: 35 mg/dL — ABNORMAL HIGH (ref 6–20)
CO2: 28 mmol/L (ref 22–32)
Calcium: 7.9 mg/dL — ABNORMAL LOW (ref 8.9–10.3)
Chloride: 97 mmol/L — ABNORMAL LOW (ref 101–111)
Creatinine, Ser: 6.03 mg/dL — ABNORMAL HIGH (ref 0.61–1.24)
GFR calc Af Amer: 10 mL/min — ABNORMAL LOW
GFR calc non Af Amer: 9 mL/min — ABNORMAL LOW
Glucose, Bld: 108 mg/dL — ABNORMAL HIGH (ref 65–99)
Phosphorus: 4.6 mg/dL (ref 2.5–4.6)
Potassium: 4.3 mmol/L (ref 3.5–5.1)
Sodium: 131 mmol/L — ABNORMAL LOW (ref 135–145)

## 2016-05-25 LAB — CBC
HEMATOCRIT: 24.9 % — AB (ref 39.0–52.0)
Hemoglobin: 7.8 g/dL — ABNORMAL LOW (ref 13.0–17.0)
MCH: 28.2 pg (ref 26.0–34.0)
MCHC: 31.3 g/dL (ref 30.0–36.0)
MCV: 89.9 fL (ref 78.0–100.0)
PLATELETS: 62 10*3/uL — AB (ref 150–400)
RBC: 2.77 MIL/uL — AB (ref 4.22–5.81)
RDW: 15 % (ref 11.5–15.5)
WBC: 3.6 10*3/uL — ABNORMAL LOW (ref 4.0–10.5)

## 2016-05-25 MED ORDER — CYANOCOBALAMIN 1000 MCG/ML IJ SOLN
1000.0000 ug | Freq: Once | INTRAMUSCULAR | Status: AC
  Administered 2016-05-25: 1000 ug via INTRAMUSCULAR
  Filled 2016-05-25 (×2): qty 1

## 2016-05-25 MED ORDER — SODIUM CHLORIDE 0.9 % IV SOLN: 100.0000 mL | INTRAVENOUS | Status: DC | PRN

## 2016-05-25 MED ORDER — LIDOCAINE HCL (PF) 1 % IJ SOLN: 5.0000 mL | INTRAMUSCULAR | Status: DC | PRN

## 2016-05-25 MED ORDER — PENTAFLUOROPROP-TETRAFLUOROETH EX AERO: 1.0000 "application " | INHALATION_SPRAY | CUTANEOUS | Status: DC | PRN

## 2016-05-25 MED ORDER — BORTEZOMIB CHEMO SQ INJECTION 3.5 MG (2.5MG/ML)
1.3000 mg/m2 | Freq: Once | INTRAMUSCULAR | Status: AC
  Administered 2016-05-25: 2.5 mg via SUBCUTANEOUS
  Filled 2016-05-25: qty 2.5

## 2016-05-25 MED ORDER — LIDOCAINE-PRILOCAINE 2.5-2.5 % EX CREA
1.0000 | TOPICAL_CREAM | CUTANEOUS | Status: DC | PRN
Start: 2016-05-25 — End: 2016-05-25

## 2016-05-25 MED ORDER — PROCHLORPERAZINE MALEATE 10 MG PO TABS
10.0000 mg | ORAL_TABLET | Freq: Once | ORAL | Status: AC
  Administered 2016-05-25: 10 mg via ORAL
  Filled 2016-05-25: qty 1

## 2016-05-25 MED ORDER — HEPARIN SODIUM (PORCINE) 1000 UNIT/ML DIALYSIS: 1000.0000 [IU] | INTRAMUSCULAR | Status: DC | PRN

## 2016-05-25 MED ORDER — ALTEPLASE 2 MG IJ SOLR: 2.0000 mg | Freq: Once | INTRAMUSCULAR | Status: DC | PRN

## 2016-05-25 NOTE — Progress Notes (Signed)
Rehab admissions - I met with patient, his wife and his daughter at the bedside.  Patient tells me he walked into the hall with PT today.  He would like inpatient rehab admission prior to home with his wife.  I will send information to [facility] and ask for acute inpatient rehab admission.  I will follow up tomorrow.  Call me for questions.  #[phone]

## 2016-05-25 NOTE — Progress Notes (Signed)
Physical Therapy Treatment Patient Details Name: Thomas [name] MRN: [id number] DOB: 11-Jul-1946 Today's Date: 05/25/2016    History of Present Illness 70 year old male with no significant medical history (had not seen a physician in many years) presented with about 1 month of nausea with daily vomiting. Also reports poor by mouth intake and subjective weight loss. Denied any diarrhea, hematemesis or bleeding per rectum. Patient went to the urgent care and was found to have a hemoglobin of 9.8 and sent to the ED. Lab work showing hyperproteinemia, abnormal lucency of lumbar vertebrae and multiple radiolucencies in the skull x-ray indicated towards multiple myeloma; now s/p IM fixation of Impending pathologic fracture, left femur with bone biopsy; WBAT; further imaging showing L3 compression fx and L5 lesion; Neurosurgery consulted, not surgical candidate at this time; they did not order a brace (new HD fistula LUE 6/14)    PT Comments    Lengthy visit was split due to pt needing a bedpan, and resumed to get up to chair with family and nursing education for pt comfort and security.  Pt is avoiding using LUE today and will resume gait on RW tomorrow.  He is aware of his limits on LUE and mainly cannot pull or use much force on it today.  Will be careful with WBing on it tomorrow with RW as the LLE is the main unloading need with walker.  Follow Up Recommendations  CIR     Equipment Recommendations  Rolling walker with 5" wheels;3in1 (PT);Wheelchair (measurements PT);Wheelchair cushion (measurements PT)    Recommendations for Other Services Rehab consult     Precautions / Restrictions Precautions Precautions: Fall Restrictions Weight Bearing Restrictions: Yes LLE Weight Bearing: Weight bearing as tolerated (to LLE and avoid stress on L arm)    Mobility  Bed Mobility Overal bed mobility: Needs Assistance Bed Mobility: Supine to Sit     Supine to sit: Mod assist;HOB elevated (help for LLE  and pt then needed trunk assist due to LUE)     General bed mobility comments: nursing educated to use drop arm chair to get back to bed  Transfers Overall transfer level: Needs assistance Equipment used: 1 person hand held assist Transfers: Lateral/Scoot Transfers (drop arm chair )          Lateral/Scoot Transfers: Mod assist;From elevated surface (PT used bed pad to decrease effort due to new fistula LUE)    Ambulation/Gait             General Gait Details: withheld   Stairs            Wheelchair Mobility    Modified Rankin (Stroke Patients Only)       Balance Overall balance assessment: Needs assistance Sitting-balance support: Feet supported Sitting balance-Leahy Scale: Good                              Cognition Arousal/Alertness: Awake/alert Behavior During Therapy: WFL for tasks assessed/performed Overall Cognitive Status: Within Functional Limits for tasks assessed                      Exercises Total Joint Exercises Ankle Circles/Pumps: AROM;Both;10 reps Quad Sets: AROM;Both;5 reps Straight Leg Raises: AAROM;Both;5 reps    General Comments General comments (skin integrity, edema, etc.): up to chair with back and LLE positioning and pillows to keep LUE elevated slightly      Pertinent Vitals/Pain Pain Assessment: Faces Faces Pain Scale: Hurts even more  Pain Location: L thigh to get OOB Pain Descriptors / Indicators: Aching Pain Intervention(s): Monitored during session;Premedicated before session;Repositioned    Home Living                      Prior Function            PT Goals (current goals can now be found in the care plan section) Acute Rehab PT Goals Patient Stated Goal: get better and go home Progress towards PT goals: Progressing toward goals    Frequency  Min 3X/week    PT Plan Current plan remains appropriate    Co-evaluation             End of Session   Activity Tolerance:  Patient tolerated treatment well;Patient limited by fatigue Patient left: in chair;with call bell/phone within reach;with nursing/sitter in room;with family/visitor present     Time: 1214-1230 (plus 1328 to 1353) PT Time Calculation (min) (ACUTE ONLY): 16 min  Charges:  $Therapeutic Exercise: 8-22 mins $Therapeutic Activity: 23-37 mins                    G Codes:      Ramond Dial 2016-05-26, 2:07 PM    Mee Hives, PT MS Acute Rehab Dept. Number: [place] [facility] [place]

## 2016-05-25 NOTE — Progress Notes (Signed)
Pharmacy Note:  Chemotherapy Dosage Communication  Spoke with Dr. Alvy Bimler regarding T.Bili value of 1.8 (1.5x ULN) with other LFTs at or below ULN.  She indicated the bilirubin elevation is more likely due to blood transfusion than to liver disease.  Orders received to proceed with today's bortezomib dosage as ordered (1.3 mg/m2) without dose reduction.  Clayburn Pert, PharmD, BCPS Pager: [phone] 05/25/2016  6:59 AM

## 2016-05-25 NOTE — Progress Notes (Signed)
PROGRESS NOTE                                                                                                                                                                                                             Patient Demographics:    Thomas [name], is a 70 y.o. male, DOB - February 18, 1946, JHH:[id number]  Admit date - 05/09/2016   Admitting Physician Etta Quill, DO  Outpatient Primary MD for the patient is No PCP Per Patient  LOS - 15  Outpatient Specialists:None  Chief Complaint  Patient presents with  . low hgb        Brief Narrative  70 year old male with no significant past medical history (had not seen a physician in many years) presented with one month of nausea with daily vomiting and poor by mouth intake with subjective weight loss. He went to an urgent care where he was found to have hemoglobin of 9.8 and sent to the ED with he was found to have creatinine of >10 and BUN of 40. Patient reported having low back pain about 3 months back and was taking several NSAIDs at home. In the ED was also severely hyperkalemic with anemia and thrombocytopenia. Admitted to hospitalist service. Further workup showed diffuse skeletal hyperlucency with pathological left femur fracture with biopsy suggestive of multiple myeloma. Patient now on hemodialysis and started on chemotherapy.   Subjective:   Patient seen after dialysis. He is frustrated with the  diet restriction. Also reports that getting any answers from nephrologist and oncologist about the duration of dialysis makes him anxious.   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 Left UE AV fistula placed on 6/14. Has been on dialysis in 6/2. CLI P process started. Oncologist hopeful that patient's renal function should recover once his response to chemotherapy. Renal closely managing.  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 Also had pathological fracture of left midshaft femur that was repaired. bone biopsy suggest multiple myeloma. MRI of the lumbar spine shows large lumbar lesion associated with Extraosseous extension of tumor into the left paraspinous soft tissue at L5 with moderate to severe cannot stenosis. Given absence of neurological deficit neurosurgery recommends no  surgical intervention. He was recommended for radiation therapy but since patient's back pain has been improving and been able to participate with PT this has been on hold. (Was seen by radiation oncologist one week back). -Patient started on Velcade. 6/8. (Velcade subcutaneous injections on days 1,4,8,11 on 05/18/16.  Next dose , day 8 today). Weekly pulse dose dexamethasone starting 6/8.  Low-grade fever with cough Suspect atelectasis postop. Encourage incentive spirometry. Cough better with antitussives. Hold off on antibiotics.  Acute pulmonary edema on chest x-ray 2-D echo pending.  Pathological fracture of left femur Status post IM nailing with open biopsy. Pain control when necessary. Outpatient follow-up with orthopedics. Seen by PT and recommends CIR. Added subcutaneous heparin at low-dose for DVT prophylaxis.  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 Currently stable.  Severe thrombocytopenia Initially due to multiple myeloma and now possibly due to chemotherapy. Stable at low levels. Received 2 units platelets 1 week back.   Vitamin B12 deficiency Getting supplement  Malnutrition of moderate degree Added supplement.  Constipation Daily MiraLAX.  Code Status : Full code  Family Communication  : Wife and daughter at bedside  Disposition Plan  : Awaiting CIR placement and outpatient dialysis set up  Barriers For Discharge  :  CIR evaluation.  Consults  :   Nephrology Oncology ( Dr Alvy Bimler) Orthopedics Neurosurgery   Procedures  :  HD catheter placement IM nailing of left femur Bone marrow biopsy AV fistula  DVT Prophylaxis  : Subcutaneous heparin  Lab Results  Component Value Date   PLT 62* 05/25/2016    Antibiotics  :    Anti-infectives    Start     Dose/Rate Route Frequency Ordered Stop   05/24/16 0821  vancomycin (VANCOCIN) 1-5 GM/200ML-% IVPB    Comments:  Rock, Anderson Malta   : cabinet override      05/24/16 0821 05/24/16 0920   05/17/16 1445  acyclovir (ZOVIRAX) 200 MG capsule 200 mg     200 mg Oral 2 times daily 05/17/16 1435     05/14/16 1915  clindamycin (CLEOCIN) IVPB 600 mg     600 mg 100 mL/hr over 30 Minutes Intravenous Every 6 hours 05/14/16 1912 05/15/16 0131   05/14/16 1200  clindamycin (CLEOCIN) IVPB 900 mg     900 mg 100 mL/hr over 30 Minutes Intravenous To Surgery 05/14/16 0055 05/14/16 1121   05/11/16 1225  vancomycin (VANCOCIN) 1-5 GM/200ML-% IVPB  Status:  Discontinued    Comments:  Shelton, Whitney   : cabinet override      05/11/16 1225 05/11/16 1234   05/11/16 1100  vancomycin (VANCOCIN) IVPB 1000 mg/200 mL premix     1,000 mg 200 mL/hr over 60 Minutes Intravenous To Radiology 05/11/16 0911 05/11/16 1354        Objective:   Filed Vitals:   05/25/16 1000 05/25/16 1030 05/25/16 1057 05/25/16 1117  BP: 112/59 113/62 123/62 139/51  Pulse: 64 65 66 77  Temp:   97.4 F (36.3 C) 97.2 F (36.2 C)  TempSrc:   Oral Oral  Resp: 14 14 19 18   Height:      Weight:   75.2 kg (165 lb 12.6 oz)   SpO2:    100%    Wt Readings from Last 3 Encounters:  05/25/16 75.2 kg (165 lb 12.6 oz)     Intake/Output Summary (Last 24 hours) at 05/25/16 1458 Last data filed at 05/25/16 1156  Gross per 24 hour  Intake  360 ml  Output   1002 ml  Net   -642 ml     Physical Exam  Gen: not in distress, appears tired HEENT: moist mucosa, supple neck Chest: clear b/l, no  added sounds, dialysis catheter CVS: N S1&S2, no murmurs, rubs or gallop GI: soft, NT, ND, BS+ Musculoskeletal: warm, no edema, left AV fistula site appears clean, Ace wrap over left thigh CNS: Alert and oriented    Data Review:    CBC  Recent Labs Lab 05/20/16 0937 05/23/16 0707 05/24/16 0648 05/25/16 0710  WBC 5.5 7.5 4.8 3.6*  HGB 8.3* 8.4* 8.1* 7.8*  HCT 24.6* 25.6* 25.5* 24.9*  PLT 99* 78* 70* 62*  MCV 85.7 86.8 89.8 89.9  MCH 28.9 28.5 28.5 28.2  MCHC 33.7 32.8 31.8 31.3  RDW 14.8 14.6 15.0 15.0  LYMPHSABS  --   --  0.5*  --   MONOABS  --   --  0.6  --   EOSABS  --   --  0.0  --   BASOSABS  --   --  0.0  --     Chemistries   Recent Labs Lab 05/19/16 0604 05/20/16 0740 05/21/16 0507 05/22/16 0446 05/23/16 0706 05/23/16 2331 05/24/16 0648 05/25/16 0711  NA 129* 128* 131* 130* 129* 130* 135 131*  K 3.6 3.5 3.4* 3.5 3.6 4.1 4.1 4.3  CL 95* 92* 96* 96* 94* 97* 103 97*  CO2 28 26 27 26 25 27 26 28   GLUCOSE 217* 182* 131* 111* 110* 137* 104* 108*  BUN 16 36* 22* 32* 42* 22* 24* 35*  CREATININE 4.04* 5.51* 3.74* 5.05* 6.38* 4.07* 4.43* 6.03*  CALCIUM 7.8* 7.8* 7.8* 7.9* 8.3* 7.9* 7.5* 7.9*  AST 20 26 27 22   --   --  19  --   ALT 13* 16* 23 20  --   --  16*  --   ALKPHOS 55 64 72 72  --   --  72  --   BILITOT 1.2 1.2 1.2 1.2  --   --  1.8*  --    ------------------------------------------------------------------------------------------------------------------ No results for input(s): CHOL, HDL, LDLCALC, TRIG, CHOLHDL, LDLDIRECT in the last 72 hours.  No results found for: HGBA1C ------------------------------------------------------------------------------------------------------------------ No results for input(s): TSH, T4TOTAL, T3FREE, THYROIDAB in the last 72 hours.  Invalid input(s): FREET3 ------------------------------------------------------------------------------------------------------------------ No results for input(s): VITAMINB12, FOLATE,  FERRITIN, TIBC, IRON, RETICCTPCT in the last 72 hours.  Coagulation profile No results for input(s): INR, PROTIME in the last 168 hours.  No results for input(s): DDIMER in the last 72 hours.  Cardiac Enzymes No results for input(s): CKMB, TROPONINI, MYOGLOBIN in the last 168 hours.  Invalid input(s): CK ------------------------------------------------------------------------------------------------------------------ No results found for: BNP  Inpatient Medications  Scheduled Meds: . sodium chloride   Intravenous Once  . sodium chloride   Intravenous Once  . acyclovir  200 mg Oral BID  . cyanocobalamin  1,000 mcg Intramuscular Once  . darbepoetin (ARANESP) injection - DIALYSIS  100 mcg Intravenous Q Sat-HD  . dexamethasone  40 mg Oral Weekly  . docusate sodium  100 mg Oral BID  . feeding supplement (NEPRO CARB STEADY)  237 mL Oral BID BM  . heparin subcutaneous  5,000 Units Subcutaneous Q8H  . multivitamin  1 tablet Oral QHS  . polyethylene glycol  17 g Oral BID  . senna-docusate  2 tablet Oral BID  . vitamin B-12  1,000 mcg Oral Daily  . Vitamin D (Ergocalciferol)  50,000 Units Oral  Q7 days   Continuous Infusions: . sodium chloride    . sodium chloride     PRN Meds:.acetaminophen **OR** acetaminophen, alum & mag hydroxide-simeth, bisacodyl, guaiFENesin-dextromethorphan, HYDROcodone-acetaminophen, hydroxypropyl methylcellulose / hypromellose, menthol-cetylpyridinium **OR** phenol, methocarbamol, metoCLOPramide **OR** metoCLOPramide (REGLAN) injection, ondansetron **OR** ondansetron (ZOFRAN) IV, ondansetron (ZOFRAN) IV, oxyCODONE-acetaminophen  Micro Results Recent Results (from the past 240 hour(s))  Culture, blood (routine x 2)     Status: None   Collection Time: 05/16/16  8:05 PM  Result Value Ref Range Status   Specimen Description BLOOD HEMODIALYSIS CATHETER  Final   Special Requests BOTTLES DRAWN AEROBIC AND ANAEROBIC 10CC  Final   Culture NO GROWTH 5 DAYS  Final    Report Status 05/21/2016 FINAL  Final  Culture, blood (routine x 2)     Status: None   Collection Time: 05/16/16  8:15 PM  Result Value Ref Range Status   Specimen Description BLOOD HEMODIALYSIS CATHETER  Final   Special Requests BOTTLES DRAWN AEROBIC AND ANAEROBIC 10CC  Final   Culture NO GROWTH 5 DAYS  Final   Report Status 05/21/2016 FINAL  Final  Culture, Urine     Status: Abnormal   Collection Time: 05/21/16  3:31 PM  Result Value Ref Range Status   Specimen Description URINE, RANDOM  Final   Special Requests NONE  Final   Culture MULTIPLE SPECIES PRESENT, SUGGEST RECOLLECTION (A)  Final   Report Status 05/23/2016 FINAL  Final    Radiology Reports Dg Skull 1-3 Views  05/11/2016  CLINICAL DATA:  Myeloma EXAM: SKULL - 1-3 VIEW COMPARISON:  None. FINDINGS: Frontal view shows 13 mm lucency in the parasagittal left skull. On the lateral view approximately 10 round or oval lucencies are identified, the largest measuring 14 mm in maximal diameter. There are also lucencies in both mandibles. IMPRESSION: Multiple radiolucencies consistent with myeloma. Electronically Signed   By: Skipper Cliche M.D.   On: 05/11/2016 10:42   Dg Chest 2 View  05/10/2016  CLINICAL DATA:  Congestive heart failure. EXAM: CHEST  2 VIEW COMPARISON:  None. FINDINGS: The heart size and mediastinal contours are within normal limits. No pneumothorax or pleural effusion is noted. Right lung is clear. Density is seen in left lower lobe which may represent either pleural-based mass or possibly expansile lesion within left posterior rib. The visualized skeletal structures are unremarkable. IMPRESSION: Left lower lobe density is noted representing pleural-based mass or expansile lesion within left posterior rib. CT scan of the chest is recommended to evaluate for possible neoplasm or malignancy. Electronically Signed   By: Marijo Conception, M.D.   On: 05/10/2016 13:08   Dg Lumbar Spine 2-3 Views  05/10/2016  CLINICAL DATA:   Lower lumbar pain radiating down the left leg. Injury in February 2017. EXAM: LUMBAR SPINE - 2-3 VIEW COMPARISON:  None. FINDINGS: Transitional S1 vertebra. Abnormal lucency in the left L5 vertebra appreciable on the frontal projection, potentially with some slight compression in this vicinity, but with lucency out of proportion to the compression. Irregular margins of the cortex of the S1 vertebra on the lateral projection. Anterior wedge compression fracture at L3 with 30% loss of vertebral body height. Aortoiliac atherosclerotic vascular disease. IMPRESSION: 1. Compression fractures at L3 and L5, with abnormal lucency in the left side of the L5 vertebral body raising concern for multiple myeloma or lytic metastatic disease from a radiographic standpoint. MRI could be utilized for further characterization if clinically warranted. 2. There is also cortical thickening and irregularity of the  S1 vertebra, underlying malignancy not excluded at S 1. 3. 30% compression fracture at L 3, age indeterminate. 4. Lumbar spondylosis. 5.  Aortoiliac atherosclerotic vascular disease. Electronically Signed   By: Van Clines M.D.   On: 05/10/2016 16:48   US Renal  05/10/2016  CLINICAL DATA:  Acute kidney injury. EXAM: RENAL / URINARY TRACT ULTRASOUND COMPLETE COMPARISON:  None. FINDINGS: Right Kidney: Length: 11.4 cm. Echogenicity within normal limits. No mass or hydronephrosis visualized. Left Kidney: Length: 11.6 cm. Echogenicity within normal limits. There is a 2.3 x 2.1 x 2.1 cm cyst in the upper pole. No fall with mass or hydronephrosis visualized. Bladder: Appears normal for degree of bladder distention. IMPRESSION: 1. Left renal cyst.  Otherwise unremarkable renal ultrasound. 2. No obstructive uropathy. Electronically Signed   By: Jeb Levering M.D.   On: 05/10/2016 04:29   Pelvis Portable  05/14/2016  CLINICAL DATA:  Left IM nail.  Postop pain. EXAM: PORTABLE PELVIS 1-2 VIEWS COMPARISON:  None. FINDINGS:  Partially images the hardware within the proximal left femur. Hip joints and SI joints are symmetric and unremarkable. No acute fracture, subluxation or dislocation. IMPRESSION: No acute bony abnormality. Electronically Signed   By: Rolm Baptise M.D.   On: 05/14/2016 15:37   Ir Fluoro Guide Cv Line Right  05/12/2016  INDICATION: 70 year old with renal failure.  Catheter needed for hemodialysis. EXAM: FLUOROSCOPIC AND ULTRASOUND GUIDED PLACEMENT OF A TUNNELED DIALYSIS CATHETER Physician: Stephan Minister. Anselm Pancoast, MD MEDICATIONS: Vancomycin 1 g; The antibiotic was administered within an appropriate time interval prior to skin puncture. ANESTHESIA/SEDATION: Versed 1.5 mg IV; Fentanyl 50 mcg IV; Moderate Sedation Time:  34 The patient was continuously monitored during the procedure by the interventional radiology nurse under my direct supervision. FLUOROSCOPY TIME:  Fluoroscopy Time: 36 seconds, 4 mGy COMPLICATIONS: None immediate. PROCEDURE: Informed consent was obtained for placement of a tunneled dialysis catheter. The patient was placed supine on the interventional table. Ultrasound confirmed a patent right internal jugular vein. Ultrasound images were obtained for documentation. The right side of the neck was prepped and draped in a sterile fashion. The right side of the neck was anesthetized with 1% lidocaine. Maximal barrier sterile technique was utilized including caps, mask, sterile gowns, sterile gloves, sterile drape, hand hygiene and skin antiseptic. A small incision was made with #11 blade scalpel. A 21 gauge needle directed into the right internal jugular vein with ultrasound guidance. A micropuncture dilator set was placed. A 19 cm tip to cuff Palindrome catheter was selected. The skin below the right clavicle was anesthetized and a small incision was made with an #11 blade scalpel. A subcutaneous tunnel was formed to the vein dermatotomy site. The catheter was brought through the tunnel. The vein dermatotomy site  was dilated to accommodate a peel-away sheath. The catheter was placed through the peel-away sheath and directed into the central venous structures. The tip of the catheter was placed at the superior cavoatrial junction with fluoroscopy. Fluoroscopic images were obtained for documentation. Both lumens were found to aspirate and flush well. The proper amount of heparin was flushed in both lumens. The vein dermatotomy site was closed using a single layer of absorbable suture and Dermabond. The catheter was secured to the skin using Prolene suture. Fluoroscopic and ultrasound images were taken and saved for documentation. IMPRESSION: Successful placement of a right jugular tunneled dialysis catheter using ultrasound and fluoroscopic guidance. Electronically Signed   By: Markus Daft M.D.   On: 05/12/2016 06:47   Ir US  Guide Vasc Access Right  05/12/2016  INDICATION: 70 year old with renal failure.  Catheter needed for hemodialysis. EXAM: FLUOROSCOPIC AND ULTRASOUND GUIDED PLACEMENT OF A TUNNELED DIALYSIS CATHETER Physician: Stephan Minister. Anselm Pancoast, MD MEDICATIONS: Vancomycin 1 g; The antibiotic was administered within an appropriate time interval prior to skin puncture. ANESTHESIA/SEDATION: Versed 1.5 mg IV; Fentanyl 50 mcg IV; Moderate Sedation Time:  34 The patient was continuously monitored during the procedure by the interventional radiology nurse under my direct supervision. FLUOROSCOPY TIME:  Fluoroscopy Time: 36 seconds, 4 mGy COMPLICATIONS: None immediate. PROCEDURE: Informed consent was obtained for placement of a tunneled dialysis catheter. The patient was placed supine on the interventional table. Ultrasound confirmed a patent right internal jugular vein. Ultrasound images were obtained for documentation. The right side of the neck was prepped and draped in a sterile fashion. The right side of the neck was anesthetized with 1% lidocaine. Maximal barrier sterile technique was utilized including caps, mask, sterile  gowns, sterile gloves, sterile drape, hand hygiene and skin antiseptic. A small incision was made with #11 blade scalpel. A 21 gauge needle directed into the right internal jugular vein with ultrasound guidance. A micropuncture dilator set was placed. A 19 cm tip to cuff Palindrome catheter was selected. The skin below the right clavicle was anesthetized and a small incision was made with an #11 blade scalpel. A subcutaneous tunnel was formed to the vein dermatotomy site. The catheter was brought through the tunnel. The vein dermatotomy site was dilated to accommodate a peel-away sheath. The catheter was placed through the peel-away sheath and directed into the central venous structures. The tip of the catheter was placed at the superior cavoatrial junction with fluoroscopy. Fluoroscopic images were obtained for documentation. Both lumens were found to aspirate and flush well. The proper amount of heparin was flushed in both lumens. The vein dermatotomy site was closed using a single layer of absorbable suture and Dermabond. The catheter was secured to the skin using Prolene suture. Fluoroscopic and ultrasound images were taken and saved for documentation. IMPRESSION: Successful placement of a right jugular tunneled dialysis catheter using ultrasound and fluoroscopic guidance. Electronically Signed   By: Markus Daft M.D.   On: 05/12/2016 06:47   Ir US Guide Bx Asp/drain  05/12/2016  INDICATION: 70 year old with renal failure.  Request for renal biopsy. EXAM: ULTRASOUND-GUIDED RENAL BIOPSY MEDICATIONS: None. ANESTHESIA/SEDATION: Patient was monitored by radiology nurse throughout the procedure. Sedation was continued from the prior hemodialysis catheter placement. FLUOROSCOPY TIME:  None COMPLICATIONS: None immediate. PROCEDURE: Informed written consent was obtained from the patient after a thorough discussion of the procedural risks, benefits and alternatives. All questions were addressed. Maximal Sterile Barrier  Technique was utilized including caps, mask, sterile gowns, sterile gloves, sterile drape, hand hygiene and skin antiseptic. A timeout was performed prior to the initiation of the procedure. Patient was placed prone. Both kidneys were evaluated with ultrasound. The right kidney was selected for biopsy. The right flank was prepped with chlorhexidine and a sterile field was created. Skin was anesthetized with 1% lidocaine. Using ultrasound guidance, 16 gauge core biopsy was obtained from the right kidney lower pole. Specimen placed in saline. A second ultrasound-guided core biopsy was obtained from the lower pole. Specimen was placed in saline. These samples were felt to be adequate. Bandage placed over the puncture site. FINDINGS: Two core biopsies obtained from the right kidney lower pole. Small perinephric hematoma identified following the procedure. This hematoma did not appear to be expanding based on post biopsy  visualization. IMPRESSION: Ultrasound-guided core biopsies of the right kidney lower pole. Electronically Signed   By: Markus Daft M.D.   On: 05/12/2016 06:54   Ct Biopsy  05/16/2016  INDICATION: 69 year old male with a history of multiple myeloma. EXAM: CT BIOPSY MEDICATIONS: None. ANESTHESIA/SEDATION: Moderate (conscious) sedation was employed during this procedure. A total of Versed 1.0 mg and Fentanyl 50 mcg was administered intravenously. Moderate Sedation Time: 15 minutes. The patient's level of consciousness and vital signs were monitored continuously by radiology nursing throughout the procedure under my direct supervision. FLUOROSCOPY TIME:  CT COMPLICATIONS: None PROCEDURE: The procedure risks, benefits, and alternatives were explained to the patient. Questions regarding the procedure were encouraged and answered. The patient understands and consents to the procedure. Scout CT of the pelvis was performed for surgical planning purposes. The posterior pelvis was prepped with Betadinein a sterile  fashion, and a sterile drape was applied covering the operative field. A sterile gown and sterile gloves were used for the procedure. Local anesthesia was provided with 1% Lidocaine. We targeted the left posterior iliac bone for biopsy. The skin and subcutaneous tissues were infiltrated with 1% lidocaine without epinephrine. A small stab incision was made with an 11 blade scalpel, and an 11 gauge Murphy needle was advanced with CT guidance to the posterior cortex. Manual forced was used to advance the needle through the posterior cortex and the stylet was removed. A bone marrow aspirate was retrieved and passed to a cytotechnologist in the room. The Murphy needle was then advanced without the stylet for a core biopsy. The core biopsy was retrieved and also passed to a cytotechnologist. Manual pressure was used for hemostasis and a sterile dressing was placed. No complications were encountered no significant blood loss was encountered. Patient tolerated the procedure well and remained hemodynamically stable throughout. IMPRESSION: Status post CT-guided bone marrow biopsy, with tissue specimen sent to pathology for complete histopathologic analysis Signed, Dulcy Fanny. Earleen Newport, DO [facility] Electronically Signed   By: Corrie Mckusick D.O.   On: 05/16/2016 10:09   Dg Chest Port 1 View  05/24/2016  CLINICAL DATA:  Cough and congestion. EXAM: PORTABLE CHEST 1 VIEW COMPARISON:  05/17/2016. FINDINGS: Right IJ sheath in stable position. Mediastinum and hilar structures normal. Cardiomegaly with mild pulmonary venous congestion . Progressive left lower lobe infiltrate consistent with pneumonia and/or asymmetric pulmonary edema. Mild increased interstitial markings noted bilaterally. Persistent left pleural effusion. No pneumothorax. IMPRESSION: 1.  Right IJ sheath in stable position. 2. Cardiomegaly with pulmonary venous congestion and mild increase in interstitial  markings noted diffusely consistent with congestive heart failure. Progressive left lower lobe infiltrate consistent with pneumonia and/or asymmetric pulmonary edema. Small left pleural effusion. Electronically Signed   By: Marcello Moores  Register   On: 05/24/2016 07:24   Dg Chest Port 1 View  05/17/2016  CLINICAL DATA:  Postop fever, left femur surgery EXAM: PORTABLE CHEST 1 VIEW COMPARISON:  05/10/2016 FINDINGS: Cardiomediastinal silhouette is stable. There is dual lumen right IJ catheter with tip in SVC right atrium junction. No pneumothorax. There is small left pleural effusion left basilar atelectasis or infiltrate. No pulmonary edema. IMPRESSION: No pulmonary edema. Small left pleural effusion with left basilar atelectasis or infiltrate. Electronically Signed   By: Lahoma Crocker M.D.   On: 05/17/2016 08:27   Dg Bone Survey Met  05/12/2016  CLINICAL DATA:  Back pain and weakness. Evaluate for multiple myeloma and bone cancer. EXAM: METASTATIC BONE SURVEY COMPARISON:  05/10/2016 FINDINGS:  Patient is edentulous. Subtle lucent areas in the calvarium are suspicious. Right jugular dialysis catheter tip in the lower SVC. No suspicious lesions in the upper extremities. Multilevel degenerativee facet disease in the cervical spine. Normal alignment of cervical spine. Prevertebral soft tissues are normal. Normal alignment of the thoracic spine. There appears to be 6 non rib-bearing vertebral bodies. Assuming that S1 is a transitional vertebral body, there is a suspicious lucent lesion along the left side of the L5 vertebral body and pedicle. Again noted is mild vertebral body height loss at L5. There is a stable superior compression deformity of the L3 vertebral body. Concern expansion of the left L1 pedicle. Cannot exclude a lesion at this location. Again noted is an expansile lesion involving the left eighth rib. This is better visualized on prior chest radiograph. Pelvic radiograph demonstrate a large lucent lesion  involving the left iliac wing. This lesion measures up to 11 cm. Heterogeneous area of lucency in the midshaft of the left femur is concerning for a lesion. There is cortical thinning in this area. No suspicious lesions in the right lower extremity. IMPRESSION: Multiple bone lesions are suggestive for metastatic bone disease and suspicious for multiple myeloma. Largest lesion is involving the left iliac wing and this would be the most amenable to biopsy. There is also an expansile lesion involving the left eighth rib and a lucent lesion involving the L5 left pedicle. Question a lesion of the left L1 pedicle. Probable small calvarial lesions. Suspicious lucency involving the midshaft of the left femur with cortical thinning. This is concerning for a metastatic bone lesion and would be at risk for a pathologic fracture. L3 compression fracture.  Questionable compression fracture at L5. These results were called by telephone at the time of interpretation on 05/12/2016 at 4:47 pm to Dr. Clementeen Graham, who verbally acknowledged these results. Electronically Signed   By: Markus Daft M.D.   On: 05/12/2016 16:47   Dg C-arm 61-120 Min  05/14/2016  CLINICAL DATA:  Elective surgery.  Left IM nail. EXAM: DG C-ARM 61-120 MIN; LEFT FEMUR 2 VIEWS COMPARISON:  05/13/2016 FINDINGS: Placement of IM nail within the left femur, bridging the permeative lesions seen on prior plain films. Hip screw in place as well. No hardware or bony complicating feature. IMPRESSION: Placement of hip screw and left femoral IM nail as above. No visible complicating feature. Electronically Signed   By: Rolm Baptise M.D.   On: 05/14/2016 13:45   Mr Total Spine Mets Screening  05/16/2016  CLINICAL DATA:  Initial evaluation for metastatic disease, presumed multiple myeloma. EXAM: MRI TOTAL SPINE WITHOUT CONTRAST TECHNIQUE: Multisequence MR imaging of the spine from the cervical spine to the sacrum was performed prior to and following IV contrast administration  for evaluation of spinal metastatic disease. CONTRAST:  None. COMPARISON:  None. FINDINGS: Cervical Findings: Limited sagittal views of the cervical spine demonstrate a diffusely abnormal marrow pattern with multiple osseous lesions present, concerning for multiple myeloma. The most prevalent of these is present in the dens which demonstrates fairly diffuse abnormal STIR signal intensity (series 6, image 6). No significant extra osseous extension of tumor. No pathologic fracture. Scattered mild degenerative disc bulging throughout the cervical spine with posterior element hypertrophy. Associated diffuse canal narrowing without severe canal stenosis. No paraspinous soft tissue mass appreciated. Thoracic Findings: Limited sagittal views of the thoracic spine demonstrate diffusely abnormal marrow pattern with multiple osseous lesions present, again highly concerning for multiple myeloma. Most prominent lesion present within the T3  vertebral body and measures 19 mm. No significant extra osseous extension of tumor identified on this limited exam. No associated pathologic fracture. No paraspinous soft tissue mass appreciated. Lumbar Findings: There are 6 non rib-bearing lumbar type vertebral bodies. The lowest vertebral body is labeled L6. Vertebral body count is made from the odontoid week. Limited sagittal views of the lumbar spine demonstrate diffusely abnormal marrow pattern with innumerable osseous lesions present, highly concerning for multiple myeloma. These are present throughout the lumbar spine and sacrum. There is associated probable pathologic fracture involving the L3 vertebral body with approximately 50% of central height loss. No significant bony retropulsion. Mild compression deformity at the superior endplate of L2, likely related to chronic Schmorl's node. Large metastatic/myelomatous lesion involves the L5 vertebral body with extension into the left posterior elements. This lesion completely involves  the central and left aspect of the L5 vertebral body with destruction of the left posterior elements and extension into the paraspinous soft tissues. Lesion measures approximately 6.0 x 9.0 x 7.5 cm (series 12, image 10). Abnormal posterior convex bowing of the L5 vertebral body with moderate to severe canal stenosis (series 10, image 7). There is extra osseous extension of tumor into the adjacent left L4-5 and L5-6 neural foramina with secondary mild narrowing at L4-5 and more moderate to severe narrowing at L5-6 (series 9, image 12). A second large ovoid lesion involving the left iliac wing is partially visualized (series 12, image 13). No other definite paraspinous soft tissues mass. T2 hyperintense cyst noted within the left kidney. IMPRESSION: 1. 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 Careful correlation to the vertebral body count on this exam is recommended prior to any potential future intervention. 2. Diffusely abnormal marrow signal intensity with innumerable osseous lesions present throughout the spine, concerning for multiple myeloma given the provided history. 3. Large 6.0 x 9.0 x 7.5 cm lesion with associated extraosseous extension of tumor into the left paraspinous soft tissues at L5. Abnormal posterior convex bowing of the L5 vertebral body with moderate to severe canal stenosis. Neural foraminal encroachment on the left at L4-5 and L5-6 as well. No other significant canal or neural foraminal encroachment within the lumbar spine. 4. No significant extraosseous tumor or or spinal canal encroachment within the cervical and thoracic spine. 5. Compression deformity of L3 with up to 50% height loss, which may be pathologic in nature. No bony retropulsion. 6. Additional large lesion with extraosseous extension of tumor involving the left iliac wing, incompletely visualized on this exam. Electronically Signed   By: Jeannine Boga M.D.   On: 05/16/2016  00:51   Dg Femur Min 2 Views Left  05/14/2016  CLINICAL DATA:  Elective surgery.  Left IM nail. EXAM: DG C-ARM 61-120 MIN; LEFT FEMUR 2 VIEWS COMPARISON:  05/13/2016 FINDINGS: Placement of IM nail within the left femur, bridging the permeative lesions seen on prior plain films. Hip screw in place as well. No hardware or bony complicating feature. IMPRESSION: Placement of hip screw and left femoral IM nail as above. No visible complicating feature. Electronically Signed   By: Rolm Baptise M.D.   On: 05/14/2016 13:45   Dg Femur Min 2 Views Left  05/13/2016  CLINICAL DATA:  Left femur pain anteriorly from the left hip to the knee. EXAM: LEFT FEMUR 2 VIEWS COMPARISON:  Bone survey 05/12/2016 FINDINGS: There is permeative lucency within the midshaft of the femur with ill-defined margins and cortical involvement as described on yesterday's study. This lesion  involves a length of at least 6 cm. No displaced fracture is identified. The left hip and left knee are located. No soft tissue abnormality is seen. IMPRESSION: Permeative left midshaft femur lesion concerning for malignancy as described on yesterday's study. No fracture identified. Electronically Signed   By: Logan Bores M.D.   On: 05/13/2016 11:40   Dg Femur Port Min 2 Views Left  05/14/2016  CLINICAL DATA:  IM nail placement.  Postop pain. EXAM: LEFT FEMUR PORTABLE 2 VIEWS COMPARISON:  05/13/2016 FINDINGS: Placement of intra medullary nail and femoral neck screw. The IM nail crosses the herniated area within the mid shaft. There is a cortical defect noted laterally, new since prior study, possibly biopsy site. No additional interval change since prior study. IMPRESSION: Intra medullary nail placed across the permeative lesion in the mid left femur. Cortical defect laterally presumably represents biopsy site. Electronically Signed   By: Rolm Baptise M.D.   On: 05/14/2016 15:38    Time Spent in minutes  25   Louellen Molder M.D on 05/25/2016 at 2:58  PM  Between 7am to 7pm - Pager - [phone]  After 7pm go to [email or web address] - password [facility]  [facility] -  Office  [phone]

## 2016-05-25 NOTE — Progress Notes (Signed)
Subjective:  Seen on HD- no complaints- no UOP recorded  Objective Vital signs in last 24 hours: Filed Vitals:   05/25/16 0830 05/25/16 0900 05/25/16 0930 05/25/16 1000  BP: 98/57 107/60 118/66 112/59  Pulse: 68 69 69 64  Temp:      TempSrc:      Resp: 14 18 17 14   Height:      Weight:      SpO2:       Weight change: 3.492 kg (7 lb 11.2 oz)  Intake/Output Summary (Last 24 hours) at 05/25/16 1010 Last data filed at 05/25/16 0600  Gross per 24 hour  Intake    120 ml  Output      0 ml  Net    120 ml    Assessment/ Plan: Pt is a 70 y.o. yo male who was admitted on 05/09/2016 with new renal failure in the setting of newly diagnosed myeloma  Assessment/Plan: 1. Renal - presenting creatinine over 10 with symptoms- no baseline information- also new diagnosis of myeloma which is the likely etiology- has been HD requiring since 5/31- doing on a TTS schedule- is likely ESRD although some pts will improve their renal status with treatment of their myeloma- it remains to be seen if Thomas [name] will be one of those patients- watch daily for HD need after today.  Now s/p AVF placed on 6/14- has PC as well  2. Myeloma- new dx- started on velcade per onc on 6/8   3. 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 Secondary hyperparathyroidism- PTH low- phos OK on no binder  5. HTN/volume- BP lowish- no BP meds- does not seem too volume overloaded- minimal goal with HD today   Roe Koffman A    Labs: Basic Metabolic Panel:  Recent Labs Lab 05/23/16 0706 05/23/16 2331 05/24/16 0648 05/25/16 0711  NA 129* 130* 135 131*  K 3.6 4.1 4.1 4.3  CL 94* 97* 103 97*  CO2 25 27 26 28   GLUCOSE 110* 137* 104* 108*  BUN 42* 22* 24* 35*  CREATININE 6.38* 4.07* 4.43* 6.03*  CALCIUM 8.3* 7.9* 7.5* 7.9*  PHOS 4.6  --   --  4.6   Liver Function Tests:  Recent Labs Lab 05/21/16 0507 05/22/16 0446 05/23/16 0706 05/24/16 0648 05/25/16 0711  AST 27 22  --  19  --   ALT 23 20  --  16*  --    ALKPHOS 72 72  --  72  --   BILITOT 1.2 1.2  --  1.8*  --   PROT 7.2 7.0  --  7.2  --   ALBUMIN 1.8* 1.9* 1.8* 1.9* 1.8*   No results for input(s): LIPASE, AMYLASE in the last 168 hours. No results for input(s): AMMONIA in the last 168 hours. CBC:  Recent Labs Lab 05/20/16 0937 05/23/16 0707 05/24/16 0648 05/25/16 0710  WBC 5.5 7.5 4.8 3.6*  NEUTROABS  --   --  3.6  --   HGB 8.3* 8.4* 8.1* 7.8*  HCT 24.6* 25.6* 25.5* 24.9*  MCV 85.7 86.8 89.8 89.9  PLT 99* 78* 70* 62*   Cardiac Enzymes: No results for input(s): CKTOTAL, CKMB, CKMBINDEX, TROPONINI in the last 168 hours. CBG: No results for input(s): GLUCAP in the last 168 hours.  Iron Studies: No results for input(s): IRON, TIBC, TRANSFERRIN, FERRITIN in the last 72 hours. Studies/Results: Dg Chest Port 1 View  05/24/2016  CLINICAL DATA:  Cough and congestion. EXAM: PORTABLE CHEST 1 VIEW COMPARISON:  05/17/2016. FINDINGS: Right IJ sheath in stable position. Mediastinum and hilar structures normal. Cardiomegaly with mild pulmonary venous congestion . Progressive left lower lobe infiltrate consistent with pneumonia and/or asymmetric pulmonary edema. Mild increased interstitial markings noted bilaterally. Persistent left pleural effusion. No pneumothorax. IMPRESSION: 1.  Right IJ sheath in stable position. 2. Cardiomegaly with pulmonary venous congestion and mild increase in interstitial markings noted diffusely consistent with congestive heart failure. Progressive left lower lobe infiltrate consistent with pneumonia and/or asymmetric pulmonary edema. Small left pleural effusion. Electronically Signed   By: Marcello Moores  Register   On: 05/24/2016 07:24   Medications: Infusions: . sodium chloride    . sodium chloride      Scheduled Medications: . sodium chloride   Intravenous Once  . sodium chloride   Intravenous Once  . acyclovir  200 mg Oral BID  . bortezomib SQ  1.3 mg/m2 (Treatment Plan Actual) Subcutaneous Once  .  cyanocobalamin  1,000 mcg Intramuscular Once  . darbepoetin (ARANESP) injection - DIALYSIS  100 mcg Intravenous Q Sat-HD  . dexamethasone  40 mg Oral Weekly  . docusate sodium  100 mg Oral BID  . feeding supplement (NEPRO CARB STEADY)  237 mL Oral BID BM  . heparin subcutaneous  5,000 Units Subcutaneous Q8H  . multivitamin  1 tablet Oral QHS  . polyethylene glycol  17 g Oral BID  . prochlorperazine  10 mg Oral Once  . senna-docusate  2 tablet Oral BID  . vitamin B-12  1,000 mcg Oral Daily  . Vitamin D (Ergocalciferol)  50,000 Units Oral Q7 days    have reviewed scheduled and prn medications.  Physical Exam: General: NAD Heart: RRR Lungs: clear Abdomen: soft, non tender Extremities: no edema Dialysis Access: right sided PC and upper arm AVF placed 6/14    05/25/2016,10:10 AM  LOS: 15 days

## 2016-05-25 NOTE — Progress Notes (Addendum)
  [facility] Progress Note  Subjective  - POD #1  Patient seen in HD. Some soreness at incision. No other complaints.   Objective Filed Vitals:   05/25/16 0930 05/25/16 1000  BP: 118/66 112/59  Pulse: 69 64  Temp:    Resp: 17 14    Intake/Output Summary (Last 24 hours) at 05/25/16 1014 Last data filed at 05/25/16 0600  Gross per 24 hour  Intake    120 ml  Output      0 ml  Net    120 ml    Left antecubital incision c/d/i. No hematoma. Palpable thrill left upper arm fistula   Assessment/Planning: 70 y.o. male is s/p: 1st stage brachial vein transposition. 1 Day Post-Op   Fistula is patent. No signs of steal syndrome.  Patient on HD via right IJ TDC. F/u in 6 weeks with Dr. Bridgett Larsson and duplex to evaluate fistula maturation. Will need 2nd stage brachial vein transposition if fistula shows good maturation. Will sign off. Please call if needed.   Thomas [name] 05/25/2016 10:14 AM --  Laboratory CBC    Component Value Date/Time   WBC 3.6* 05/25/2016 0710   HGB 7.8* 05/25/2016 0710   HCT 24.9* 05/25/2016 0710   HCT 22.8* 05/10/2016 1156   PLT 62* 05/25/2016 0710    BMET    Component Value Date/Time   NA 131* 05/25/2016 0711   K 4.3 05/25/2016 0711   CL 97* 05/25/2016 0711   CO2 28 05/25/2016 0711   GLUCOSE 108* 05/25/2016 0711   BUN 35* 05/25/2016 0711   CREATININE 6.03* 05/25/2016 0711   CALCIUM 7.9* 05/25/2016 0711   GFRNONAA 9* 05/25/2016 0711   GFRAA 10* 05/25/2016 0711    COAG Lab Results  Component Value Date   INR 1.46 05/11/2016   INR 1.40 05/10/2016   No results found for: PTT  Antibiotics Anti-infectives    Start     Dose/Rate Route Frequency Ordered Stop   05/24/16 0821  vancomycin (VANCOCIN) 1-5 GM/200ML-% IVPB    Comments:  Rock, Jennifer   : cabinet override      05/24/16 0821 05/24/16 0920   05/17/16 1445  acyclovir (ZOVIRAX) 200 MG capsule 200 mg     200 mg Oral 2 times daily 05/17/16 1435     05/14/16 1915   clindamycin (CLEOCIN) IVPB 600 mg     600 mg 100 mL/hr over 30 Minutes Intravenous Every 6 hours 05/14/16 1912 05/15/16 0131   05/14/16 1200  clindamycin (CLEOCIN) IVPB 900 mg     900 mg 100 mL/hr over 30 Minutes Intravenous To Surgery 05/14/16 0055 05/14/16 1121   05/11/16 1225  vancomycin (VANCOCIN) 1-5 GM/200ML-% IVPB  Status:  Discontinued    Comments:  Shelton, Whitney   : cabinet override      05/11/16 1225 05/11/16 1234   05/11/16 1100  vancomycin (VANCOCIN) IVPB 1000 mg/200 mL premix     1,000 mg 200 mL/hr over 60 Minutes Intravenous To Radiology 05/11/16 0911 05/11/16 [place], PA-C [facility] Office: [phone] Pager: [phone] 05/25/2016 10:14 AM   Addendum  No steal sx.  Follow up in office in 6 weeks for check on maturation.  Thomas Barthel, MD [facility] [place] Office: [phone] Pager: [phone]  05/25/2016, 1:43 PM

## 2016-05-25 NOTE — Procedures (Signed)
Patient was seen on dialysis and the procedure was supervised.  BFR 400  Via PC BP is  112/59.   Patient appears to be tolerating treatment well  Cable Fearn A 05/25/2016

## 2016-05-26 ENCOUNTER — Inpatient Hospital Stay (HOSPITAL_COMMUNITY): Payer: PPO

## 2016-05-26 DIAGNOSIS — I509 Heart failure, unspecified: Secondary | ICD-10-CM

## 2016-05-26 LAB — RENAL FUNCTION PANEL
Albumin: 2 g/dL — ABNORMAL LOW (ref 3.5–5.0)
Anion gap: 9 (ref 5–15)
BUN: 22 mg/dL — AB (ref 6–20)
CHLORIDE: 95 mmol/L — AB (ref 101–111)
CO2: 27 mmol/L (ref 22–32)
CREATININE: 3.98 mg/dL — AB (ref 0.61–1.24)
Calcium: 8.1 mg/dL — ABNORMAL LOW (ref 8.9–10.3)
GFR calc non Af Amer: 14 mL/min — ABNORMAL LOW (ref >60)
GFR, EST AFRICAN AMERICAN: 16 mL/min — AB (ref >60)
Glucose, Bld: 156 mg/dL — ABNORMAL HIGH (ref 65–99)
POTASSIUM: 4.1 mmol/L (ref 3.5–5.1)
Phosphorus: 4.6 mg/dL (ref 2.5–4.6)
Sodium: 131 mmol/L — ABNORMAL LOW (ref 135–145)

## 2016-05-26 LAB — ECHOCARDIOGRAM COMPLETE
CHL CUP MV DEC (S): 211
CHL CUP TV REG PEAK VELOCITY: 210 cm/s
E/e' ratio: 8.02
EWDT: 211 ms
FS: 40 % (ref 28–44)
HEIGHTINCHES: 73 in
IV/PV OW: 0.95
LA diam end sys: 35 mm
LA vol: 68.3 mL
LADIAMINDEX: 1.78 cm/m2
LASIZE: 35 mm
LAVOLA4C: 55.6 mL
LAVOLIN: 34.7 mL/m2
LDCA: 3.14 cm2
LV E/e'average: 8.02
LV TDI E'LATERAL: 12.6
LV TDI E'MEDIAL: 8.38
LV e' LATERAL: 12.6 cm/s
LVEEMED: 8.02
LVOTD: 20 mm
MV Peak grad: 4 mmHg
MV pk A vel: 58.2 m/s
MVPKEVEL: 101 m/s
PW: 10.4 mm — AB (ref 0.6–1.1)
TAPSE: 27.4 mm
TR max vel: 210 cm/s
Weight: 2612.8 oz

## 2016-05-26 MED ORDER — PRO-STAT SUGAR FREE PO LIQD
30.0000 mL | Freq: Every day | ORAL | Status: DC
  Administered 2016-05-26 – 2016-05-28 (×3): 30 mL via ORAL
  Filled 2016-05-26 (×3): qty 30

## 2016-05-26 MED ORDER — BOOST / RESOURCE BREEZE PO LIQD
1.0000 | Freq: Two times a day (BID) | ORAL | Status: DC
  Administered 2016-05-26 – 2016-05-29 (×3): 1 via ORAL

## 2016-05-26 NOTE — Care Management Important Message (Signed)
Important Message  Patient Details  Name: Thomas [name] MRN: [id number] Date of Birth: May 31, 1946   Medicare Important Message Given:  Yes    [name] 05/26/2016, 9:33 AM

## 2016-05-26 NOTE — Progress Notes (Signed)
Patient has an OP spot TTS second shift at [facility] for when he leaves the hospital   Thomas [name] A

## 2016-05-26 NOTE — Progress Notes (Signed)
Echocardiogram 2D Echocardiogram has been performed.  Thomas [name] 05/26/2016, 10:35 AM

## 2016-05-26 NOTE — Progress Notes (Signed)
Subjective:  S/p HD yest- removed 1000 tolerated well- no complaints- 400 UOP recorded overnight Objective Vital signs in last 24 hours: Filed Vitals:   05/25/16 1638 05/25/16 2130 05/26/16 0527 05/26/16 0758  BP: 135/61 116/59 122/56 133/52  Pulse: 82 80 69 70  Temp: 98.2 F (36.8 C) 98.5 F (36.9 C) 98.7 F (37.1 C) 97.8 F (36.6 C)  TempSrc: Oral Oral Oral Oral  Resp: 17 18 19 18   Height:      Weight:  74.072 kg (163 lb 4.8 oz)    SpO2: 97% 97% 99% 100%   Weight change: -2.592 kg (-5 lb 11.4 oz)  Intake/Output Summary (Last 24 hours) at 05/26/16 1024 Last data filed at 05/25/16 2215  Gross per 24 hour  Intake    720 ml  Output   1402 ml  Net   -682 ml    Assessment/ Plan: Pt is a 70 y.o. yo male who was admitted on 05/09/2016 with new renal failure in the setting of newly diagnosed myeloma  Assessment/Plan: 1. Renal - presenting creatinine over 10 with symptoms- no baseline information- also new diagnosis of myeloma which is the likely etiology- has been HD requiring since 5/31- doing on a TTS schedule- CLIP in process- is likely ESRD although some pts will improve their renal status with treatment of their myeloma- it remains to be seen if Mr. Toye will be one of those patients- watch daily for HD need - making some urine- will check labs in AM before writing orders.  Now s/p AVF placed on 6/14- has PC as well  2. Myeloma- new dx- started on velcade per onc on 6/8   3. 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 Secondary hyperparathyroidism- PTH low- phos OK on no binder  5. HTN/volume- BP lowish- no BP meds- does not seem too volume overloaded- minimal goals with HD  6. Dispo- now possibly going to inpatient rehab- dialysis placement depends on his dispo   Rhilynn Preyer A    Labs: Basic Metabolic Panel:  Recent Labs Lab 05/23/16 0706  05/24/16 0648 05/25/16 0711 05/26/16 0733  NA 129*  < > 135 131* 131*  K 3.6  < > 4.1 4.3 4.1  CL 94*  < >  103 97* 95*  CO2 25  < > 26 28 27   GLUCOSE 110*  < > 104* 108* 156*  BUN 42*  < > 24* 35* 22*  CREATININE 6.38*  < > 4.43* 6.03* 3.98*  CALCIUM 8.3*  < > 7.5* 7.9* 8.1*  PHOS 4.6  --   --  4.6 4.6  < > = values in this interval not displayed. Liver Function Tests:  Recent Labs Lab 05/21/16 0507 05/22/16 0446  05/24/16 0648 05/25/16 0711 05/26/16 0733  AST 27 22  --  19  --   --   ALT 23 20  --  16*  --   --   ALKPHOS 72 72  --  72  --   --   BILITOT 1.2 1.2  --  1.8*  --   --   PROT 7.2 7.0  --  7.2  --   --   ALBUMIN 1.8* 1.9*  < > 1.9* 1.8* 2.0*  < > = values in this interval not displayed. No results for input(s): LIPASE, AMYLASE in the last 168 hours. No results for input(s): AMMONIA in the last 168 hours. CBC:  Recent Labs Lab 05/20/16 [phone] 05/23/16 0707 05/24/16 [id number] 05/25/16 0710  WBC 5.5 7.5 4.8 3.6*  NEUTROABS  --   --  3.6  --   HGB 8.3* 8.4* 8.1* 7.8*  HCT 24.6* 25.6* 25.5* 24.9*  MCV 85.7 86.8 89.8 89.9  PLT 99* 78* 70* 62*   Cardiac Enzymes: No results for input(s): CKTOTAL, CKMB, CKMBINDEX, TROPONINI in the last 168 hours. CBG: No results for input(s): GLUCAP in the last 168 hours.  Iron Studies: No results for input(s): IRON, TIBC, TRANSFERRIN, FERRITIN in the last 72 hours. Studies/Results: No results found. Medications: Infusions: . sodium chloride    . sodium chloride      Scheduled Medications: . sodium chloride   Intravenous Once  . sodium chloride   Intravenous Once  . acyclovir  200 mg Oral BID  . darbepoetin (ARANESP) injection - DIALYSIS  100 mcg Intravenous Q Sat-HD  . dexamethasone  40 mg Oral Weekly  . docusate sodium  100 mg Oral BID  . feeding supplement (NEPRO CARB STEADY)  237 mL Oral BID BM  . heparin subcutaneous  5,000 Units Subcutaneous Q8H  . multivitamin  1 tablet Oral QHS  . polyethylene glycol  17 g Oral BID  . senna-docusate  2 tablet Oral BID  . vitamin B-12  1,000 mcg Oral Daily  . Vitamin D (Ergocalciferol)   50,000 Units Oral Q7 days    have reviewed scheduled and prn medications.  Physical Exam: General: NAD Heart: RRR Lungs: clear Abdomen: soft, non tender Extremities: no edema Dialysis Access: right sided PC and upper arm AVF placed 6/14    05/26/2016,10:24 AM  LOS: 16 days

## 2016-05-26 NOTE — PMR Pre-admission (Signed)
PMR Admission Coordinator Pre-Admission Assessment  Patient: Thomas [name] is an 70 y.o., male MRN: [id number] DOB: [birth year] Height: 6' 1"  (185.4 cm) Weight: 70.11 kg (154 lb 9 oz)              Insurance Information HMO:    PPO: Yes     PCP:       IPA:       80/20:       OTHER:   PRIMARY: [employer]      Policy#: [id number]      Subscriber: [name] CM Name: Margreta Journey      Phone#:       Fax#: [phone] Pre-Cert#: [id number]      Employer: Retired Benefits:  Phone #: [phone]     Name: Emmit Pomfret. Date: 12/12/15     Deduct: $0      Out of Pocket Max: $3400 (met $ 70.00)      Life Max: unlimited CIR: $225 days 1-6      SNF: $0 days 1-20; $150 days 21-100 Outpatient: medical necessity     Co-Pay: $15 Home Health: with authorization      Co-Pay: $25 copay DME: 80%     Co-Pay: 20% Providers: in network  Emergency [facility]    Name Relation Home Work Mobile   [place] Spouse [phone]  [phone]   Draylon, Mercadel Daughter   [phone]   [facility] Daughter   [phone]     Current Medical History  Patient Admitting Diagnosis: Pathologic fracture left femur    History of Present Illness: A 70 y.o. (no medical follow up) with onset of back pain March 2017 treated with NSAIDS and conservative care. He was admitted via ED 5/31 with nausea due to uremia, renal failure with BUN/Cr- 61/10.5 and anemia. Dr. [place] Footman consulted and HD initiated. Bone scan done due to complaints to severe back pain and showed multiple bony lesions suspicious for MM--largest in left iliac wing, L3 compression fracture and question L5 compression fracture, and left mid shaft lesion with thinning at risk for pathologic fracture. He underwent IM fixation of left femur on 06/4 by Dr. Lyla Glassing. WBAT LLE with spica wrap left hip. 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 ABLA treated with transfusion and  thrombocytopenia being monitored.   Dr. Tammi Klippel consulted and recommended palliative XRT to spine and left femur. Dr. Alvy Bimler consulted for treatment options and patient started on Velcade injections days 1,4,8 and 11 on 06/8. To have weekly pulse decadron 40 mg in conjunction with injections. L-AVF placed by Dr. Bridgett Larsson on 6/14 and HD ongoing on TTS. Blood pressures on low side but fluid overload noted and low grade fevers noted on 06/15. 2 D echo with EF 60%-65% and no wall abnormality. Patient to be admitted for a comprehensive inpatient rehabilitation program.  Past Medical History  Past Medical History  Diagnosis Date  . Compression fracture     Family History  family history includes Diabetes in his father.  Prior Rehab/Hospitalizations: No previous rehab  Has the patient had major surgery during 100 days prior to admission? No.  However, patient does report that he had teeth extractions about a year ago.  Current Medications   Current facility-administered medications:  .  0.9 %  sodium chloride infusion, , Intravenous, Continuous, Adam Hodierne, MD .  0.9 %  sodium chloride infusion, , Intravenous, Continuous, Jaclyn M Bissell, PA-C .  0.9 %  sodium chloride infusion, , Intravenous, Once, Aaron Edelman  Swinteck, MD .  0.9 %  sodium chloride infusion, , Intravenous, Once, Janece Canterbury, MD .  acetaminophen (TYLENOL) tablet 650 mg, 650 mg, Oral, Q6H PRN **OR** acetaminophen (TYLENOL) suppository 650 mg, 650 mg, Rectal, Q6H PRN, Cecilie Kicks, PA-C .  acyclovir (ZOVIRAX) 200 MG capsule 200 mg, 200 mg, Oral, BID, Ni Gorsuch, MD, 200 mg at 05/29/16 1005 .  ALPRAZolam (XANAX) tablet 0.25 mg, 0.25 mg, Oral, BID PRN, Corliss Parish, MD, 0.25 mg at 05/27/16 1315 .  alum & mag hydroxide-simeth (MAALOX/MYLANTA) 200-200-20 MG/5ML suspension 30 mL, 30 mL, Oral, Q4H PRN, Cecilie Kicks, PA-C .  calcium acetate (PHOSLO) capsule 667 mg, 667 mg, Oral, TID WC, Corliss Parish, MD, 667 mg  at 05/29/16 0755 .  Darbepoetin Alfa (ARANESP) injection 100 mcg, 100 mcg, Intravenous, Q Sat-HD, Modena Jansky, MD, 100 mcg at 05/20/16 1132 .  dexamethasone (DECADRON) tablet 40 mg, 40 mg, Oral, Weekly, Heath Lark, MD, 40 mg at 05/25/16 1252 .  feeding supplement (BOOST / RESOURCE BREEZE) liquid 1 Container, 1 Container, Oral, BID BM, Nishant Dhungel, MD, 1 Container at 05/29/16 1005 .  feeding supplement (PRO-STAT SUGAR FREE 64) liquid 30 mL, 30 mL, Oral, Q1500, Nishant Dhungel, MD, 30 mL at 05/28/16 1257 .  guaiFENesin (MUCINEX) 12 hr tablet 600 mg, 600 mg, Oral, BID, Nishant Dhungel, MD, 600 mg at 05/29/16 1005 .  guaiFENesin-dextromethorphan (ROBITUSSIN DM) 100-10 MG/5ML syrup 5 mL, 5 mL, Oral, Q4H PRN, Clanford Marisa Hua, MD, 5 mL at 05/28/16 2300 .  heparin injection 5,000 Units, 5,000 Units, Subcutaneous, Q8H, Nishant Dhungel, MD, 5,000 Units at 05/29/16 0624 .  HYDROcodone-acetaminophen (NORCO/VICODIN) 5-325 MG per tablet 1-2 tablet, 1-2 tablet, Oral, Q6H PRN, Cecilie Kicks, PA-C, 2 tablet at 05/25/16 0128 .  hydroxypropyl methylcellulose / hypromellose (ISOPTO TEARS / GONIOVISC) 2.5 % ophthalmic solution 2 drop, 2 drop, Both Eyes, QID PRN, Louellen Molder, MD, 2 drop at 05/25/16 2352 .  menthol-cetylpyridinium (CEPACOL) lozenge 3 mg, 1 lozenge, Oral, PRN, 3 mg at 05/21/16 2148 **OR** phenol (CHLORASEPTIC) mouth spray 1 spray, 1 spray, Mouth/Throat, PRN, Cecilie Kicks, PA-C .  methocarbamol (ROBAXIN) tablet 250 mg, 250 mg, Oral, Q6H PRN, Janece Canterbury, MD, 250 mg at 05/28/16 2141 .  metoCLOPramide (REGLAN) tablet 5-10 mg, 5-10 mg, Oral, Q8H PRN **OR** metoCLOPramide (REGLAN) injection 5-10 mg, 5-10 mg, Intravenous, Q8H PRN, Cecilie Kicks, PA-C .  multivitamin (RENA-VIT) tablet 1 tablet, 1 tablet, Oral, QHS, Mauricia Area, MD, 1 tablet at 05/28/16 2139 .  ondansetron (ZOFRAN) tablet 4 mg, 4 mg, Oral, Q6H PRN **OR** ondansetron (ZOFRAN) injection 4 mg, 4 mg, Intravenous, Q6H  PRN, Cecilie Kicks, PA-C, 4 mg at 05/24/16 4627 .  ondansetron (ZOFRAN) injection 4 mg, 4 mg, Intravenous, Once PRN, Jillyn Hidden, MD .  oxyCODONE-acetaminophen (PERCOCET/ROXICET) 5-325 MG per tablet 1-2 tablet, 1-2 tablet, Oral, Q6H PRN, Alvia Grove, PA-C .  vitamin B-12 (CYANOCOBALAMIN) tablet 1,000 mcg, 1,000 mcg, Oral, Daily, Nishant Dhungel, MD, 1,000 mcg at 05/29/16 1005 .  Vitamin D (Ergocalciferol) (DRISDOL) capsule 50,000 Units, 50,000 Units, Oral, Q7 days, Heath Lark, MD, 50,000 Units at 05/25/16 1137  Patients Current Diet: Diet renal with fluid restriction Fluid restriction:: 1200 mL Fluid; Room service appropriate?: Yes; Fluid consistency:: Thin  Precautions / Restrictions Precautions Precautions: Fall Restrictions Weight Bearing Restrictions: Yes LLE Weight Bearing: Weight bearing as tolerated   Has the patient had 2 or more falls or a fall with injury in the past year?No  Prior Activity  Level Community (5-7x/wk): Was active, went out daily, was driving.  Home Assistive Devices / Equipment Home Assistive Devices/Equipment: Cane (specify quad or straight), Eyeglasses, Dentures (specify type)  Prior Device Use: Indicate devices/aids used by the patient prior to current illness, exacerbation or injury? Manual wheelchair, Archivist.  He reports that he has been using a cane most recently.  Prior Functional Level Prior Function Level of Independence: Independent  Self Care: Did the patient need help bathing, dressing, using the toilet or eating?  Independent  Indoor Mobility: Did the patient need assistance with walking from room to room (with or without device)? Independent  Stairs: Did the patient need assistance with internal or external stairs (with or without device)? Independent  Functional Cognition: Did the patient need help planning regular tasks such as shopping or remembering to take medications? Independent  Current Functional  Level Cognition  Overall Cognitive Status: Within Functional Limits for tasks assessed Orientation Level: Oriented X4    Extremity Assessment (includes Sensation/Coordination)  Upper Extremity Assessment: Generalized weakness  Lower Extremity Assessment: Defer to PT evaluation RLE Deficits / Details: Grossly decr AROM and strength throughout, significantly limited by pain; quad activation and hamstring activation present, but weak RLE: Unable to fully assess due to pain    ADLs  Overall ADL's : Needs assistance/impaired Grooming: Wash/dry hands, Sitting, Set up Upper Body Bathing: Sitting, Minimal assitance Lower Body Bathing: Maximal assistance Upper Body Dressing : Minimal assistance, Sitting Upper Body Dressing Details (indicate cue type and reason): doffed front opening gown Lower Body Dressing: Total assistance Toilet Transfer: Stand-pivot, RW, Minimal assistance, Moderate assistance Toileting- Clothing Manipulation and Hygiene: Total assistance Functional mobility during ADLs: Minimal assistance, Moderate assistance General ADL Comments: Pt fatigued having been up in chair since this morning, limited session.    Mobility  Overal bed mobility: Needs Assistance Bed Mobility: Supine to Sit Rolling: Supervision Supine to sit: Mod assist, HOB elevated (help for LLE and pt then needed trunk assist due to LUE) Sit to sidelying: Min assist General bed mobility comments: nursing educated to use drop arm chair to get back to bed    Transfers  Overall transfer level: Needs assistance Equipment used: 1 person hand held assist Transfers: Lateral/Scoot Transfers (drop arm chair ) Sit to Stand: Min assist Stand pivot transfers: Min assist  Lateral/Scoot Transfers: Mod assist, From elevated surface (PT used bed pad to decrease effort due to new fistula LUE) General transfer comment: verbal cues for hand placement, assist to power up and stabilize initial standing balance    Ambulation  / Gait / Stairs / Wheelchair Mobility  Ambulation/Gait Ambulation/Gait assistance: Min assist, +2 safety/equipment Ambulation Distance (Feet): 35 Feet Assistive device: Rolling walker (2 wheeled) Gait Pattern/deviations: Step-to pattern, Antalgic General Gait Details: withheld Gait velocity: decreased Gait velocity interpretation: Below normal speed for age/gender    Posture / Balance Dynamic Sitting Balance Sitting balance - Comments: Pt able to maintain EOB sitting balance. Much improved from initial eval last week Balance Overall balance assessment: Needs assistance Sitting-balance support: Feet supported Sitting balance-Leahy Scale: Good Sitting balance - Comments: Pt able to maintain EOB sitting balance. Much improved from initial eval last week Postural control: Right lateral lean Standing balance support: During functional activity, Bilateral upper extremity supported Standing balance-Leahy Scale: Fair Standing balance comment: Increased time required to stabilize initial standing balance.    Special needs/care consideration BiPAP/CPAP No CPM No Continuous Drip IV No  Dialysis Yes, patient has an OP spot TTS second shift at  [facility] for when he leaves the hospital          Days T-Th-Sat Life Vest No Oxygen No Special Bed No Trach Size No Wound Vac (area) No     Skin Has left leg surgical incision with ace wrap.  Bruises easily.                             Bowel mgmt: Last BM 05/26/16 Bladder mgmt: Oliguria, using a urinal Diabetic mgmt No    Previous Home Environment Living Arrangements: Spouse/significant other  Lives With: Spouse Available Help at Discharge: Family, Available 24 hours/day Type of Home: House Home Layout: One level Home Access: Stairs to enter [employer] of Steps: 1 step down and then 1 step up into home Home Care Services: No Additional Comments: Pt, wife, and daughter expressed understanding that he will need rehabilitation once  medically stable  Discharge Living Setting Plans for Discharge Living Setting: Patient's home, House, Lives with (comment) (Lives with wife.) Type of Home at Discharge: House Discharge Home Layout: One level Discharge Home Access: Stairs to enter Entrance Stairs-Number of Steps: 1 step down and then 1 step up Does the patient have any problems obtaining your medications?: No  Social/Family/Support Systems Patient Roles: Spouse, Parent (Has a wife and 2 daughters.) Contact Information: Tia Gelb - wife Anticipated Caregiver: wife Anticipated Caregiver's Contact Information: Lenell Antu - wife (h) [phone] (c) [phone] Ability/Limitations of Caregiver: Wife can assist,  A daughter lives close by and is off work for the summer and can help.  Another daughter works. Caregiver Availability: 24/7 Discharge Plan Discussed with Primary Caregiver: Yes Is Caregiver In Agreement with Plan?: Yes Does Caregiver/Family have Issues with Lodging/Transportation while Pt is in Rehab?: No  Goals/Additional Needs Patient/Family Goal for Rehab: PT/OT mod I and supervision goals Expected length of stay: 10-15 days Cultural Considerations: None Dietary Needs: Renal diet, 1200 mL/day, thin liquids Equipment Needs: TBD Special Service Needs: HD T-TH-Sat.  Patient has an OP spot TTS second shift at [facility] for when he leaves the hospital per nephrology.  Pt/Family Agrees to Admission and willing to participate: Yes Program Orientation Provided & Reviewed with Pt/Caregiver Including Roles  & Responsibilities: Yes  Decrease burden of Care through IP rehab admission: N/A  Possible need for SNF placement upon discharge: Not planned  Patient Condition: This patient's medical and functional status has changed since the consult dated: 05/22/16 in which the Rehabilitation Physician determined and documented that the patient's condition is appropriate for intensive rehabilitative care in an inpatient  rehabilitation facility. See "History of Present Illness" (above) for medical update. Functional changes are:  Currently requiring mod assist for transfers. Patient's medical and functional status update has been discussed with the Rehabilitation physician and patient remains appropriate for inpatient rehabilitation. Will admit to inpatient rehab today.  Preadmission Screen Completed By:  Retta Diones, 05/29/2016 11:22 AM ______________________________________________________________________   Discussed status with Dr. Posey Pronto on 05/29/16 at 1114 and received telephone approval for admission today.  Admission Coordinator:  Retta Diones, time1115/Date06/19/17

## 2016-05-26 NOTE — Progress Notes (Signed)
Rehab admissions - I am waiting to hear back from insurance medical director regarding potential inpatient rehab admission.  Call me for questions.  RC:[id number]

## 2016-05-26 NOTE — Progress Notes (Signed)
PROGRESS NOTE                                                                                                                                                                                                             Patient Demographics:    Thomas [name], is a 70 y.o. male, DOB - [birth year], SWH:[id number]  Admit date - 05/09/2016   Admitting Physician Etta Quill, DO  Outpatient Primary MD for the patient is No PCP Per Patient  LOS - 16  Outpatient Specialists:None  Chief Complaint  Patient presents with  . low hgb        Brief Narrative  70 year old male with no significant past medical history (had not seen a physician in many years) presented with one month of nausea with daily vomiting and poor by mouth intake with subjective weight loss. He went to an urgent care where he was found to have hemoglobin of 9.8 and sent to the ED with he was found to have creatinine of >10 and BUN of 40. Patient reported having low back pain about 3 months back and was taking several NSAIDs at home. In the ED was also severely hyperkalemic with anemia and thrombocytopenia. Admitted to hospitalist service. Further workup showed diffuse skeletal hyperlucency with pathological left femur fracture with biopsy suggestive of multiple myeloma. Patient now on hemodialysis and started on chemotherapy.   Subjective:   Patient seen after dialysis. Reports several loose bowel movements  After being on Several stool softeners.   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 Left UE AV fistula placed on 6/14. Has been on dialysis in 6/2. CLI P process started. Oncologist hopeful that patient's renal function should recover once his response to chemotherapy. Renal closely managing.  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 Also had pathological fracture of left midshaft femur that was repaired. bone biopsy suggest multiple myeloma. MRI of the lumbar spine shows large lumbar lesion associated with Extraosseous extension of tumor into the left paraspinous soft tissue at L5 with moderate to severe cannot stenosis. Given absence of neurological deficit neurosurgery recommends no surgical intervention. He was recommended for radiation therapy but since patient's back pain has  been improving and been able to participate with PT this has been on hold. (Was seen by radiation oncologist one week back). -Patient started on Velcade. 6/8. (Velcade subcutaneous injections on days 1,4,8,11 on 05/18/16.  . Weekly pulse dose dexamethasone starting 6/8.  Low-grade fever with cough Suspect atelectasis postop. Encourage incentive spirometry. Cough better with antitussives.   Acute pulmonary edema on chest x-ray 2-D echo Shows a normal EF with no wall motion abnormality..  Pathological fracture of left femur Status post IM nailing with open biopsy. Pain control when necessary. Outpatient follow-up with orthopedics. Seen by PT and recommends CIR. Added subcutaneous heparin at low-dose for DVT prophylaxis.  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 Currently stable.  Severe thrombocytopenia Initially due to multiple myeloma and now possibly due to chemotherapy. Stable at low levels. Received 2 units platelets 1 week back.   Vitamin B12 deficiency Getting supplement  Malnutrition of moderate degree Added supplement.    Code Status : Full code  Family Communication  : Wife and daughter at bedside  Disposition Plan  : Awaiting CIR placement  Barriers For Discharge :  CIR Authorization pending.  Consults  :   Nephrology Oncology ( Dr Alvy Bimler) Orthopedics Neurosurgery   Procedures  :  HD catheter  placement IM nailing of left femur Bone marrow biopsy AV fistula  DVT Prophylaxis  : Subcutaneous heparin  Lab Results  Component Value Date   PLT 62* 05/25/2016    Antibiotics  :    Anti-infectives    Start     Dose/Rate Route Frequency Ordered Stop   05/24/16 0821  vancomycin (VANCOCIN) 1-5 GM/200ML-% IVPB    Comments:  Rock, Anderson [place]   : cabinet override      05/24/16 0821 05/24/16 0920   05/17/16 1445  acyclovir (ZOVIRAX) 200 MG capsule 200 mg     200 mg Oral 2 times daily 05/17/16 1435     05/14/16 1915  clindamycin (CLEOCIN) IVPB 600 mg     600 mg 100 mL/hr over 30 Minutes Intravenous Every 6 hours 05/14/16 1912 05/15/16 0131   05/14/16 1200  clindamycin (CLEOCIN) IVPB 900 mg     900 mg 100 mL/hr over 30 Minutes Intravenous To Surgery 05/14/16 0055 05/14/16 1121   05/11/16 1225  vancomycin (VANCOCIN) 1-5 GM/200ML-% IVPB  Status:  Discontinued    Comments:  Shelton, Whitney   : cabinet override      05/11/16 1225 05/11/16 1234   05/11/16 1100  vancomycin (VANCOCIN) IVPB 1000 mg/200 mL premix     1,000 mg 200 mL/hr over 60 Minutes Intravenous To Radiology 05/11/16 0911 05/11/16 1354        Objective:   Filed Vitals:   05/25/16 1638 05/25/16 2130 05/26/16 0527 05/26/16 0758  BP: 135/61 116/59 122/56 133/52  Pulse: 82 80 69 70  Temp: 98.2 F (36.8 C) 98.5 F (36.9 C) 98.7 F (37.1 C) 97.8 F (36.6 C)  TempSrc: Oral Oral Oral Oral  Resp: _0 Height:      Weight:  74.072 kg (163 lb 4.8 oz)    SpO2: 97% 97% 99% 100%    Wt Readings from Last 3 Encounters:  05/25/16 74.072 kg (163 lb 4.8 oz)     Intake/Output Summary (Last 24 hours) at 05/26/16 1825 Last data filed at 05/26/16 1300  Gross per 24 hour  Intake    700 ml  Output    400 ml  Net  300 ml     Physical Exam  Gen: not in distress,  HEENT: moist mucosa, supple neck Chest: clear b/l, no added sounds, dialysis catheter CVS: N S1&S2, no murmurs, rubs or gallop GI: soft, NT, ND,  BS+ Musculoskeletal: warm, no edema, left AV fistula site appears clean, Dressing over her left thigh CNS: Alert and oriented    Data Review:    CBC  Recent Labs Lab 05/20/16 0937 05/23/16 0707 05/24/16 0648 05/25/16 0710  WBC 5.5 7.5 4.8 3.6*  HGB 8.3* 8.4* 8.1* 7.8*  HCT 24.6* 25.6* 25.5* 24.9*  PLT 99* 78* 70* 62*  MCV 85.7 86.8 89.8 89.9  MCH 28.9 28.5 28.5 28.2  MCHC 33.7 32.8 31.8 31.3  RDW 14.8 14.6 15.0 15.0  LYMPHSABS  --   --  0.5*  --   MONOABS  --   --  0.6  --   EOSABS  --   --  0.0  --   BASOSABS  --   --  0.0  --     Chemistries   Recent Labs Lab 05/20/16 0740 05/21/16 0507 05/22/16 0446 05/23/16 0706 05/23/16 2331 05/24/16 0648 05/25/16 0711 05/26/16 0733  NA 128* 131* 130* 129* 130* 135 131* 131*  K 3.5 3.4* 3.5 3.6 4.1 4.1 4.3 4.1  CL 92* 96* 96* 94* 97* 103 97* 95*  CO2 _0 GLUCOSE 182* 131* 111* 110* 137* 104* 108* 156*  BUN 36* 22* 32* 42* 22* 24* 35* 22*  CREATININE 5.51* 3.74* 5.05* 6.38* 4.07* 4.43* 6.03* 3.98*  CALCIUM 7.8* 7.8* 7.9* 8.3* 7.9* 7.5* 7.9* 8.1*  AST _1 --   --  19  --   --   ALT 16* 23 20  --   --  16*  --   --   ALKPHOS 64 72 72  --   --  72  --   --   BILITOT 1.2 1.2 1.2  --   --  1.8*  --   --    ------------------------------------------------------------------------------------------------------------------ No results for input(s): CHOL, HDL, LDLCALC, TRIG, CHOLHDL, LDLDIRECT in the last 72 hours.  No results found for: HGBA1C ------------------------------------------------------------------------------------------------------------------ No results for input(s): TSH, T4TOTAL, T3FREE, THYROIDAB in the last 72 hours.  Invalid input(s): FREET3 ------------------------------------------------------------------------------------------------------------------ No results for input(s): VITAMINB12, FOLATE, FERRITIN, TIBC, IRON, RETICCTPCT in the last 72 hours.  Coagulation profile No  results for input(s): INR, PROTIME in the last 168 hours.  No results for input(s): DDIMER in the last 72 hours.  Cardiac Enzymes No results for input(s): CKMB, TROPONINI, MYOGLOBIN in the last 168 hours.  Invalid input(s): CK ------------------------------------------------------------------------------------------------------------------ No results found for: BNP  Inpatient Medications  Scheduled Meds: . sodium chloride   Intravenous Once  . sodium chloride   Intravenous Once  . acyclovir  200 mg Oral BID  . darbepoetin (ARANESP) injection - DIALYSIS  100 mcg Intravenous Q Sat-HD  . dexamethasone  40 mg Oral Weekly  . feeding supplement  1 Container Oral BID BM  . feeding supplement (PRO-STAT SUGAR FREE 64)  30 mL Oral Q1500  . heparin subcutaneous  5,000 Units Subcutaneous Q8H  . multivitamin  1 tablet Oral QHS  . vitamin B-12  1,000 mcg Oral Daily  . Vitamin D (Ergocalciferol)  50,000 Units Oral Q7 days   Continuous Infusions: . sodium chloride    . sodium chloride     PRN Meds:.acetaminophen **OR** acetaminophen, alum & mag hydroxide-simeth, guaiFENesin-dextromethorphan, HYDROcodone-acetaminophen,  hydroxypropyl methylcellulose / hypromellose, menthol-cetylpyridinium **OR** phenol, methocarbamol, metoCLOPramide **OR** metoCLOPramide (REGLAN) injection, ondansetron **OR** ondansetron (ZOFRAN) IV, ondansetron (ZOFRAN) IV, oxyCODONE-acetaminophen  Micro Results Recent Results (from the past 240 hour(s))  Culture, blood (routine x 2)     Status: None   Collection Time: 05/16/16  8:05 PM  Result Value Ref Range Status   Specimen Description BLOOD HEMODIALYSIS CATHETER  Final   Special Requests BOTTLES DRAWN AEROBIC AND ANAEROBIC 10CC  Final   Culture NO GROWTH 5 DAYS  Final   Report Status 05/21/2016 FINAL  Final  Culture, blood (routine x 2)     Status: None   Collection Time: 05/16/16  8:15 PM  Result Value Ref Range Status   Specimen Description BLOOD HEMODIALYSIS  CATHETER  Final   Special Requests BOTTLES DRAWN AEROBIC AND ANAEROBIC 10CC  Final   Culture NO GROWTH 5 DAYS  Final   Report Status 05/21/2016 FINAL  Final  Culture, Urine     Status: Abnormal   Collection Time: 05/21/16  3:31 PM  Result Value Ref Range Status   Specimen Description URINE, RANDOM  Final   Special Requests NONE  Final   Culture MULTIPLE SPECIES PRESENT, SUGGEST RECOLLECTION (A)  Final   Report Status 05/23/2016 FINAL  Final    Radiology Reports Dg Skull 1-3 Views  05/11/2016  CLINICAL DATA:  Myeloma EXAM: SKULL - 1-3 VIEW COMPARISON:  None. FINDINGS: Frontal view shows 13 mm lucency in the parasagittal left skull. On the lateral view approximately 10 round or oval lucencies are identified, the largest measuring 14 mm in maximal diameter. There are also lucencies in both mandibles. IMPRESSION: Multiple radiolucencies consistent with myeloma. Electronically Signed   By: Skipper Cliche M.D.   On: 05/11/2016 10:42   Dg Chest 2 View  05/10/2016  CLINICAL DATA:  Congestive heart failure. EXAM: CHEST  2 VIEW COMPARISON:  None. FINDINGS: The heart size and mediastinal contours are within normal limits. No pneumothorax or pleural effusion is noted. Right lung is clear. Density is seen in left lower lobe which may represent either pleural-based mass or possibly expansile lesion within left posterior rib. The visualized skeletal structures are unremarkable. IMPRESSION: Left lower lobe density is noted representing pleural-based mass or expansile lesion within left posterior rib. CT scan of the chest is recommended to evaluate for possible neoplasm or malignancy. Electronically Signed   By: Marijo Conception, M.D.   On: 05/10/2016 13:08   Dg Lumbar Spine 2-3 Views  05/10/2016  CLINICAL DATA:  Lower lumbar pain radiating down the left leg. Injury in February 2017. EXAM: LUMBAR SPINE - 2-3 VIEW COMPARISON:  None. FINDINGS: Transitional S1 vertebra. Abnormal lucency in the left L5 vertebra  appreciable on the frontal projection, potentially with some slight compression in this vicinity, but with lucency out of proportion to the compression. Irregular margins of the cortex of the S1 vertebra on the lateral projection. Anterior wedge compression fracture at L3 with 30% loss of vertebral body height. Aortoiliac atherosclerotic vascular disease. IMPRESSION: 1. Compression fractures at L3 and L5, with abnormal lucency in the left side of the L5 vertebral body raising concern for multiple myeloma or lytic metastatic disease from a radiographic standpoint. MRI could be utilized for further characterization if clinically warranted. 2. There is also cortical thickening and irregularity of the S1 vertebra, underlying malignancy not excluded at S 1. 3. 30% compression fracture at L 3, age indeterminate. 4. Lumbar spondylosis. 5.  Aortoiliac atherosclerotic vascular disease. Electronically Signed  By: Van Clines M.D.   On: 05/10/2016 16:48   US Renal  05/10/2016  CLINICAL DATA:  Acute kidney injury. EXAM: RENAL / URINARY TRACT ULTRASOUND COMPLETE COMPARISON:  None. FINDINGS: Right Kidney: Length: 11.4 cm. Echogenicity within normal limits. No mass or hydronephrosis visualized. Left Kidney: Length: 11.6 cm. Echogenicity within normal limits. There is a 2.3 x 2.1 x 2.1 cm cyst in the upper pole. No fall with mass or hydronephrosis visualized. Bladder: Appears normal for degree of bladder distention. IMPRESSION: 1. Left renal cyst.  Otherwise unremarkable renal ultrasound. 2. No obstructive uropathy. Electronically Signed   By: Jeb Levering M.D.   On: 05/10/2016 04:29   Pelvis Portable  05/14/2016  CLINICAL DATA:  Left IM nail.  Postop pain. EXAM: PORTABLE PELVIS 1-2 VIEWS COMPARISON:  None. FINDINGS: Partially images the hardware within the proximal left femur. Hip joints and SI joints are symmetric and unremarkable. No acute fracture, subluxation or dislocation. IMPRESSION: No acute bony  abnormality. Electronically Signed   By: Rolm Baptise M.D.   On: 05/14/2016 15:37   Ir Fluoro Guide Cv Line Right  05/12/2016  INDICATION: 70 year old with renal failure.  Catheter needed for hemodialysis. EXAM: FLUOROSCOPIC AND ULTRASOUND GUIDED PLACEMENT OF A TUNNELED DIALYSIS CATHETER Physician: Stephan Minister. Anselm Pancoast, MD MEDICATIONS: Vancomycin 1 g; The antibiotic was administered within an appropriate time interval prior to skin puncture. ANESTHESIA/SEDATION: Versed 1.5 mg IV; Fentanyl 50 mcg IV; Moderate Sedation Time:  34 The patient was continuously monitored during the procedure by the interventional radiology nurse under my direct supervision. FLUOROSCOPY TIME:  Fluoroscopy Time: 36 seconds, 4 mGy COMPLICATIONS: None immediate. PROCEDURE: Informed consent was obtained for placement of a tunneled dialysis catheter. The patient was placed supine on the interventional table. Ultrasound confirmed a patent right internal jugular vein. Ultrasound images were obtained for documentation. The right side of the neck was prepped and draped in a sterile fashion. The right side of the neck was anesthetized with 1% lidocaine. Maximal barrier sterile technique was utilized including caps, mask, sterile gowns, sterile gloves, sterile drape, hand hygiene and skin antiseptic. A small incision was made with #11 blade scalpel. A 21 gauge needle directed into the right internal jugular vein with ultrasound guidance. A micropuncture dilator set was placed. A 19 cm tip to cuff Palindrome catheter was selected. The skin below the right clavicle was anesthetized and a small incision was made with an #11 blade scalpel. A subcutaneous tunnel was formed to the vein dermatotomy site. The catheter was brought through the tunnel. The vein dermatotomy site was dilated to accommodate a peel-away sheath. The catheter was placed through the peel-away sheath and directed into the central venous structures. The tip of the catheter was placed at the  superior cavoatrial junction with fluoroscopy. Fluoroscopic images were obtained for documentation. Both lumens were found to aspirate and flush well. The proper amount of heparin was flushed in both lumens. The vein dermatotomy site was closed using a single layer of absorbable suture and Dermabond. The catheter was secured to the skin using Prolene suture. Fluoroscopic and ultrasound images were taken and saved for documentation. IMPRESSION: Successful placement of a right jugular tunneled dialysis catheter using ultrasound and fluoroscopic guidance. Electronically Signed   By: Markus Daft M.D.   On: 05/12/2016 06:47   Ir US Guide Vasc Access Right  05/12/2016  INDICATION: 70 year old with renal failure.  Catheter needed for hemodialysis. EXAM: FLUOROSCOPIC AND ULTRASOUND GUIDED PLACEMENT OF A TUNNELED DIALYSIS CATHETER Physician: Stephan Minister.  Anselm Pancoast, MD MEDICATIONS: Vancomycin 1 g; The antibiotic was administered within an appropriate time interval prior to skin puncture. ANESTHESIA/SEDATION: Versed 1.5 mg IV; Fentanyl 50 mcg IV; Moderate Sedation Time:  34 The patient was continuously monitored during the procedure by the interventional radiology nurse under my direct supervision. FLUOROSCOPY TIME:  Fluoroscopy Time: 36 seconds, 4 mGy COMPLICATIONS: None immediate. PROCEDURE: Informed consent was obtained for placement of a tunneled dialysis catheter. The patient was placed supine on the interventional table. Ultrasound confirmed a patent right internal jugular vein. Ultrasound images were obtained for documentation. The right side of the neck was prepped and draped in a sterile fashion. The right side of the neck was anesthetized with 1% lidocaine. Maximal barrier sterile technique was utilized including caps, mask, sterile gowns, sterile gloves, sterile drape, hand hygiene and skin antiseptic. A small incision was made with #11 blade scalpel. A 21 gauge needle directed into the right internal jugular vein with  ultrasound guidance. A micropuncture dilator set was placed. A 19 cm tip to cuff Palindrome catheter was selected. The skin below the right clavicle was anesthetized and a small incision was made with an #11 blade scalpel. A subcutaneous tunnel was formed to the vein dermatotomy site. The catheter was brought through the tunnel. The vein dermatotomy site was dilated to accommodate a peel-away sheath. The catheter was placed through the peel-away sheath and directed into the central venous structures. The tip of the catheter was placed at the superior cavoatrial junction with fluoroscopy. Fluoroscopic images were obtained for documentation. Both lumens were found to aspirate and flush well. The proper amount of heparin was flushed in both lumens. The vein dermatotomy site was closed using a single layer of absorbable suture and Dermabond. The catheter was secured to the skin using Prolene suture. Fluoroscopic and ultrasound images were taken and saved for documentation. IMPRESSION: Successful placement of a right jugular tunneled dialysis catheter using ultrasound and fluoroscopic guidance. Electronically Signed   By: Markus Daft M.D.   On: 05/12/2016 06:47   Ir US Guide Bx Asp/drain  05/12/2016  INDICATION: 70 year old with renal failure.  Request for renal biopsy. EXAM: ULTRASOUND-GUIDED RENAL BIOPSY MEDICATIONS: None. ANESTHESIA/SEDATION: Patient was monitored by radiology nurse throughout the procedure. Sedation was continued from the prior hemodialysis catheter placement. FLUOROSCOPY TIME:  None COMPLICATIONS: None immediate. PROCEDURE: Informed written consent was obtained from the patient after a thorough discussion of the procedural risks, benefits and alternatives. All questions were addressed. Maximal Sterile Barrier Technique was utilized including caps, mask, sterile gowns, sterile gloves, sterile drape, hand hygiene and skin antiseptic. A timeout was performed prior to the initiation of the procedure.  Patient was placed prone. Both kidneys were evaluated with ultrasound. The right kidney was selected for biopsy. The right flank was prepped with chlorhexidine and a sterile field was created. Skin was anesthetized with 1% lidocaine. Using ultrasound guidance, 16 gauge core biopsy was obtained from the right kidney lower pole. Specimen placed in saline. A second ultrasound-guided core biopsy was obtained from the lower pole. Specimen was placed in saline. These samples were felt to be adequate. Bandage placed over the puncture site. FINDINGS: Two core biopsies obtained from the right kidney lower pole. Small perinephric hematoma identified following the procedure. This hematoma did not appear to be expanding based on post biopsy visualization. IMPRESSION: Ultrasound-guided core biopsies of the right kidney lower pole. Electronically Signed   By: Markus Daft M.D.   On: 05/12/2016 06:54   Ct Biopsy  05/16/2016  INDICATION: 70 year old male with a history of multiple myeloma. EXAM: CT BIOPSY MEDICATIONS: None. ANESTHESIA/SEDATION: Moderate (conscious) sedation was employed during this procedure. A total of Versed 1.0 mg and Fentanyl 50 mcg was administered intravenously. Moderate Sedation Time: 15 minutes. The patient's level of consciousness and vital signs were monitored continuously by radiology nursing throughout the procedure under my direct supervision. FLUOROSCOPY TIME:  CT COMPLICATIONS: None PROCEDURE: The procedure risks, benefits, and alternatives were explained to the patient. Questions regarding the procedure were encouraged and answered. The patient understands and consents to the procedure. Scout CT of the pelvis was performed for surgical planning purposes. The posterior pelvis was prepped with Betadinein a sterile fashion, and a sterile drape was applied covering the operative field. A sterile gown and sterile gloves were used for the procedure. Local anesthesia was provided with 1% Lidocaine. We  targeted the left posterior iliac bone for biopsy. The skin and subcutaneous tissues were infiltrated with 1% lidocaine without epinephrine. A small stab incision was made with an 11 blade scalpel, and an 11 gauge Murphy needle was advanced with CT guidance to the posterior cortex. Manual forced was used to advance the needle through the posterior cortex and the stylet was removed. A bone marrow aspirate was retrieved and passed to a cytotechnologist in the room. The Murphy needle was then advanced without the stylet for a core biopsy. The core biopsy was retrieved and also passed to a cytotechnologist. Manual pressure was used for hemostasis and a sterile dressing was placed. No complications were encountered no significant blood loss was encountered. Patient tolerated the procedure well and remained hemodynamically stable throughout. IMPRESSION: Status post CT-guided bone marrow biopsy, with tissue specimen sent to pathology for complete histopathologic analysis Signed, Dulcy Fanny. Earleen Newport, DO [facility] Radiology Electronically Signed   By: Corrie Mckusick D.O.   On: 05/16/2016 10:09   Dg Chest Port 1 View  05/24/2016  CLINICAL DATA:  Cough and congestion. EXAM: PORTABLE CHEST 1 VIEW COMPARISON:  05/17/2016. FINDINGS: Right IJ sheath in stable position. Mediastinum and hilar structures normal. Cardiomegaly with mild pulmonary venous congestion . Progressive left lower lobe infiltrate consistent with pneumonia and/or asymmetric pulmonary edema. Mild increased interstitial markings noted bilaterally. Persistent left pleural effusion. No pneumothorax. IMPRESSION: 1.  Right IJ sheath in stable position. 2. Cardiomegaly with pulmonary venous congestion and mild increase in interstitial markings noted diffusely consistent with congestive heart failure. Progressive left lower lobe infiltrate consistent with pneumonia and/or asymmetric pulmonary edema. Small left pleural  effusion. Electronically Signed   By: Marcello Moores  Register   On: 05/24/2016 07:24   Dg Chest Port 1 View  05/17/2016  CLINICAL DATA:  Postop fever, left femur surgery EXAM: PORTABLE CHEST 1 VIEW COMPARISON:  05/10/2016 FINDINGS: Cardiomediastinal silhouette is stable. There is dual lumen right IJ catheter with tip in SVC right atrium junction. No pneumothorax. There is small left pleural effusion left basilar atelectasis or infiltrate. No pulmonary edema. IMPRESSION: No pulmonary edema. Small left pleural effusion with left basilar atelectasis or infiltrate. Electronically Signed   By: Lahoma Crocker M.D.   On: 05/17/2016 08:27   Dg Bone Survey Met  05/12/2016  CLINICAL DATA:  Back pain and weakness. Evaluate for multiple myeloma and bone cancer. EXAM: METASTATIC BONE SURVEY COMPARISON:  05/10/2016 FINDINGS: Patient is edentulous. Subtle lucent areas in the calvarium are suspicious. Right jugular dialysis catheter tip in the lower SVC. No suspicious lesions in the upper extremities. Multilevel degenerativee facet disease in  the cervical spine. Normal alignment of cervical spine. Prevertebral soft tissues are normal. Normal alignment of the thoracic spine. There appears to be 6 non rib-bearing vertebral bodies. Assuming that S1 is a transitional vertebral body, there is a suspicious lucent lesion along the left side of the L5 vertebral body and pedicle. Again noted is mild vertebral body height loss at L5. There is a stable superior compression deformity of the L3 vertebral body. Concern expansion of the left L1 pedicle. Cannot exclude a lesion at this location. Again noted is an expansile lesion involving the left eighth rib. This is better visualized on prior chest radiograph. Pelvic radiograph demonstrate a large lucent lesion involving the left iliac wing. This lesion measures up to 11 cm. Heterogeneous area of lucency in the midshaft of the left femur is concerning for a lesion. There is cortical thinning in this  area. No suspicious lesions in the right lower extremity. IMPRESSION: Multiple bone lesions are suggestive for metastatic bone disease and suspicious for multiple myeloma. Largest lesion is involving the left iliac wing and this would be the most amenable to biopsy. There is also an expansile lesion involving the left eighth rib and a lucent lesion involving the L5 left pedicle. Question a lesion of the left L1 pedicle. Probable small calvarial lesions. Suspicious lucency involving the midshaft of the left femur with cortical thinning. This is concerning for a metastatic bone lesion and would be at risk for a pathologic fracture. L3 compression fracture.  Questionable compression fracture at L5. These results were called by telephone at the time of interpretation on 05/12/2016 at 4:47 pm to Dr. Clementeen Graham, who verbally acknowledged these results. Electronically Signed   By: Markus Daft M.D.   On: 05/12/2016 16:47   Dg C-arm 61-120 Min  05/14/2016  CLINICAL DATA:  Elective surgery.  Left IM nail. EXAM: DG C-ARM 61-120 MIN; LEFT FEMUR 2 VIEWS COMPARISON:  05/13/2016 FINDINGS: Placement of IM nail within the left femur, bridging the permeative lesions seen on prior plain films. Hip screw in place as well. No hardware or bony complicating feature. IMPRESSION: Placement of hip screw and left femoral IM nail as above. No visible complicating feature. Electronically Signed   By: Rolm Baptise M.D.   On: 05/14/2016 13:45   Mr Total Spine Mets Screening  05/16/2016  CLINICAL DATA:  Initial evaluation for metastatic disease, presumed multiple myeloma. EXAM: MRI TOTAL SPINE WITHOUT CONTRAST TECHNIQUE: Multisequence MR imaging of the spine from the cervical spine to the sacrum was performed prior to and following IV contrast administration for evaluation of spinal metastatic disease. CONTRAST:  None. COMPARISON:  None. FINDINGS: Cervical Findings: Limited sagittal views of the cervical spine demonstrate a diffusely abnormal marrow  pattern with multiple osseous lesions present, concerning for multiple myeloma. The most prevalent of these is present in the dens which demonstrates fairly diffuse abnormal STIR signal intensity (series 6, image 6). No significant extra osseous extension of tumor. No pathologic fracture. Scattered mild degenerative disc bulging throughout the cervical spine with posterior element hypertrophy. Associated diffuse canal narrowing without severe canal stenosis. No paraspinous soft tissue mass appreciated. Thoracic Findings: Limited sagittal views of the thoracic spine demonstrate diffusely abnormal marrow pattern with multiple osseous lesions present, again highly concerning for multiple myeloma. Most prominent lesion present within the T3 vertebral body and measures 19 mm. No significant extra osseous extension of tumor identified on this limited exam. No associated pathologic fracture. No paraspinous soft tissue mass appreciated. Lumbar Findings: There are  6 non rib-bearing lumbar type vertebral bodies. The lowest vertebral body is labeled L6. Vertebral body count is made from the odontoid week. Limited sagittal views of the lumbar spine demonstrate diffusely abnormal marrow pattern with innumerable osseous lesions present, highly concerning for multiple myeloma. These are present throughout the lumbar spine and sacrum. There is associated probable pathologic fracture involving the L3 vertebral body with approximately 50% of central height loss. No significant bony retropulsion. Mild compression deformity at the superior endplate of L2, likely related to chronic Schmorl's node. Large metastatic/myelomatous lesion involves the L5 vertebral body with extension into the left posterior elements. This lesion completely involves the central and left aspect of the L5 vertebral body with destruction of the left posterior elements and extension into the paraspinous soft tissues. Lesion measures approximately 6.0 x 9.0 x 7.5  cm (series 12, image 10). Abnormal posterior convex bowing of the L5 vertebral body with moderate to severe canal stenosis (series 10, image 7). There is extra osseous extension of tumor into the adjacent left L4-5 and L5-6 neural foramina with secondary mild narrowing at L4-5 and more moderate to severe narrowing at L5-6 (series 9, image 12). A second large ovoid lesion involving the left iliac wing is partially visualized (series 12, image 13). No other definite paraspinous soft tissues mass. T2 hyperintense cyst noted within the left kidney. IMPRESSION: 1. 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 Careful correlation to the vertebral body count on this exam is recommended prior to any potential future intervention. 2. Diffusely abnormal marrow signal intensity with innumerable osseous lesions present throughout the spine, concerning for multiple myeloma given the provided history. 3. Large 6.0 x 9.0 x 7.5 cm lesion with associated extraosseous extension of tumor into the left paraspinous soft tissues at L5. Abnormal posterior convex bowing of the L5 vertebral body with moderate to severe canal stenosis. Neural foraminal encroachment on the left at L4-5 and L5-6 as well. No other significant canal or neural foraminal encroachment within the lumbar spine. 4. No significant extraosseous tumor or or spinal canal encroachment within the cervical and thoracic spine. 5. Compression deformity of L3 with up to 50% height loss, which may be pathologic in nature. No bony retropulsion. 6. Additional large lesion with extraosseous extension of tumor involving the left iliac wing, incompletely visualized on this exam. Electronically Signed   By: Jeannine Boga M.D.   On: 05/16/2016 00:51   Dg Femur Min 2 Views Left  05/14/2016  CLINICAL DATA:  Elective surgery.  Left IM nail. EXAM: DG C-ARM 61-120 MIN; LEFT FEMUR 2 VIEWS COMPARISON:  05/13/2016 FINDINGS: Placement of IM nail  within the left femur, bridging the permeative lesions seen on prior plain films. Hip screw in place as well. No hardware or bony complicating feature. IMPRESSION: Placement of hip screw and left femoral IM nail as above. No visible complicating feature. Electronically Signed   By: Rolm Baptise M.D.   On: 05/14/2016 13:45   Dg Femur Min 2 Views Left  05/13/2016  CLINICAL DATA:  Left femur pain anteriorly from the left hip to the knee. EXAM: LEFT FEMUR 2 VIEWS COMPARISON:  Bone survey 05/12/2016 FINDINGS: There is permeative lucency within the midshaft of the femur with ill-defined margins and cortical involvement as described on yesterday's study. This lesion involves a length of at least 6 cm. No displaced fracture is identified. The left hip and left knee are located. No soft tissue abnormality is seen. IMPRESSION: Permeative left midshaft femur  lesion concerning for malignancy as described on yesterday's study. No fracture identified. Electronically Signed   By: Logan Bores M.D.   On: 05/13/2016 11:40   Dg Femur Port Min 2 Views Left  05/14/2016  CLINICAL DATA:  IM nail placement.  Postop pain. EXAM: LEFT FEMUR PORTABLE 2 VIEWS COMPARISON:  05/13/2016 FINDINGS: Placement of intra medullary nail and femoral neck screw. The IM nail crosses the herniated area within the mid shaft. There is a cortical defect noted laterally, new since prior study, possibly biopsy site. No additional interval change since prior study. IMPRESSION: Intra medullary nail placed across the permeative lesion in the mid left femur. Cortical defect laterally presumably represents biopsy site. Electronically Signed   By: Rolm Baptise M.D.   On: 05/14/2016 15:38    Time Spent in minutes  20   Louellen Molder M.D on 05/26/2016 at 6:25 PM  Between 7am to 7pm - Pager - [phone]  After 7pm go to [email or web address] - password [facility]  [facility] -  Office  [phone]

## 2016-05-26 NOTE — Progress Notes (Signed)
Nutrition Follow-up  DOCUMENTATION CODES:   Non-severe (moderate) malnutrition in context of chronic illness  INTERVENTION:  Discontinue Nepro shake.   Provide Boost Breeze po BID, each supplement provides 250 kcal and 9 grams of protein.  Provide 30 ml Prostat po once daily, each supplement provides 100 kcal and 15 grams of protein.   Encourage adequate PO intake.   NUTRITION DIAGNOSIS:   Malnutrition related to chronic illness as evidenced by moderate depletions of muscle mass, moderate depletion of body fat; ongoing  GOAL:   Patient will meet greater than or equal to 90% of their needs; met  MONITOR:   PO intake, Supplement acceptance, Weight trends, Labs, I & O's  REASON FOR ASSESSMENT:   Malnutrition Screening Tool    ASSESSMENT:   70 y.o. male with medical history significant of not seeing a physician in many years. Patient has been on very high dose NSAIDS recently due to development of back pain. After his family member found out about this they cut down on the amount of NSAIDs he was taking. However over the past 1 month or so he has had almost daily emesis in the morning. In the ED the patient is found to be in frank renal failure with creatinine >10. Lab work showing hyperproteinemia, abnormal lucency of lumbar vertebrae and multiple radiolucencies in the skull x-ray indicated towards multiple myeloma. Also found to have pathological fracture of left femur and underwent IM nailing. Receiving hemodialysis since 6/2. Pt with ESRD. Pt undergoing chemo.  Meal completion has been varied from 10-100%, with intake 85-100% the past 2 days. Pt reports nausea from his chemo. Pt reports he is unable to tolerate Nepro Shakes and does not like the thick texture anymore. Pt is agreeable to Boost Breeze instead. RD to modify orders. RD to additionally order Prostat to aid in protein needs. Family requested handout of foods that are recommended and not recommended on the renal diet.  Handout given. RD to continue to monitor.   Labs and medications reviewed.   Diet Order:  Diet renal with fluid restriction Fluid restriction:: 1200 mL Fluid; Room service appropriate?: Yes; Fluid consistency:: Thin  Skin:  Reviewed, no issues  Last BM:  6/16  Height:   Ht Readings from Last 1 Encounters:  05/10/16 6' 1"  (1.854 m)    Weight:   Wt Readings from Last 1 Encounters:  05/25/16 163 lb 4.8 oz (74.072 kg)    Ideal Body Weight:  83.6 kg  BMI:  Body mass index is 21.55 kg/(m^2).  Estimated Nutritional Needs:   Kcal:  2050-2250  Protein:  100-110 grams  Fluid:  1.2 L/day  EDUCATION NEEDS:   Education needs addressed  Corrin Parker, MS, RD, LDN Pager # [phone] After hours/ weekend pager # [phone]

## 2016-05-27 ENCOUNTER — Telehealth: Payer: Self-pay | Admitting: Vascular Surgery

## 2016-05-27 LAB — RENAL FUNCTION PANEL
ANION GAP: 9 (ref 5–15)
Albumin: 2 g/dL — ABNORMAL LOW (ref 3.5–5.0)
BUN: 38 mg/dL — ABNORMAL HIGH (ref 6–20)
CHLORIDE: 94 mmol/L — AB (ref 101–111)
CO2: 27 mmol/L (ref 22–32)
CREATININE: 5.02 mg/dL — AB (ref 0.61–1.24)
Calcium: 8.1 mg/dL — ABNORMAL LOW (ref 8.9–10.3)
GFR, EST AFRICAN AMERICAN: 12 mL/min — AB (ref >60)
GFR, EST NON AFRICAN AMERICAN: 11 mL/min — AB (ref >60)
Glucose, Bld: 117 mg/dL — ABNORMAL HIGH (ref 65–99)
POTASSIUM: 4.6 mmol/L (ref 3.5–5.1)
Phosphorus: 5 mg/dL — ABNORMAL HIGH (ref 2.5–4.6)
SODIUM: 130 mmol/L — AB (ref 135–145)

## 2016-05-27 MED ORDER — CALCIUM ACETATE (PHOS BINDER) 667 MG PO CAPS
667.0000 mg | ORAL_CAPSULE | Freq: Three times a day (TID) | ORAL | Status: DC
  Administered 2016-05-27 – 2016-05-29 (×5): 667 mg via ORAL
  Filled 2016-05-27 (×5): qty 1

## 2016-05-27 MED ORDER — ALPRAZOLAM 0.25 MG PO TABS
0.2500 mg | ORAL_TABLET | Freq: Two times a day (BID) | ORAL | Status: DC | PRN
  Administered 2016-05-27: 0.25 mg via ORAL
  Filled 2016-05-27: qty 1

## 2016-05-27 MED ORDER — DARBEPOETIN ALFA 100 MCG/0.5ML IJ SOSY
PREFILLED_SYRINGE | INTRAMUSCULAR | Status: AC
  Administered 2016-05-27: 100 ug
  Filled 2016-05-27: qty 0.5

## 2016-05-27 MED ORDER — ALPRAZOLAM 0.5 MG PO TABS
ORAL_TABLET | ORAL | Status: AC
  Filled 2016-05-27: qty 1

## 2016-05-27 NOTE — Telephone Encounter (Signed)
-----   Message from Mena Goes, RN sent at 05/24/2016  2:03 PM EDT ----- Regarding: schedule   ----- Message -----    From: Conrad Shiprock, MD    Sent: 05/24/2016   9:33 AM      To: [facility] [address]  Thomas [name] [id number] Jun 20, 1946  PROCEDURE: left first stage brachial vein transposition (brachiobrachial arteriovenous fistula) placement  Asst: Silva Bandy, [facility]   Follow-up: 6 weeks

## 2016-05-27 NOTE — Telephone Encounter (Signed)
Sched appt 7/28 at 8:30. Lm on hm# to inform pt.

## 2016-05-27 NOTE — Progress Notes (Signed)
Subjective:  No UOP recorded and patient confirms no urine- creatinine 3.98 up to 5 off of HD - slightly nauseated this AM he thinks due to chemo  Objective Vital signs in last 24 hours: Filed Vitals:   05/26/16 1853 05/26/16 2102 05/27/16 0644 05/27/16 0851  BP: 119/52 125/60 149/54 128/58  Pulse: 90 74 74 83  Temp: 99.1 F (37.3 C) 98.2 F (36.8 C) 98.4 F (36.9 C) 97.4 F (36.3 C)  TempSrc: Oral Oral Oral Oral  Resp: 19 18 18 18   Height:      Weight:  72.576 kg (160 lb)    SpO2: 93% 97% 100% 100%   Weight change: -2.624 kg (-5 lb 12.6 oz)  Intake/Output Summary (Last 24 hours) at 05/27/16 0910 Last data filed at 05/27/16 0900  Gross per 24 hour  Intake    780 ml  Output      0 ml  Net    780 ml    Assessment/ Plan: Pt is a 70 y.o. yo male who was admitted on 05/09/2016 with new renal failure in the setting of newly diagnosed myeloma  Assessment/Plan: 1. Renal - presenting creatinine over 10 with symptoms- no baseline information- also new diagnosis of myeloma which is the likely etiology- has been HD requiring since 5/31- doing on a TTS schedule- CLIP- done- has spot TTS at [place]-  is likely ESRD although some pts will improve their renal status with treatment of their myeloma- it remains to be seen if Thomas [name] will be one of those patients-  Now s/p AVF placed on 6/14- has PC as well.  Since no urine and creatinine rising will do treatment today 2. Myeloma- new dx- started on velcade per onc on 6/8   3. 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 Secondary hyperparathyroidism- PTH low- phos 5- will add phoslo 5. HTN/volume- BP lowish- no BP meds- does not seem too volume overloaded- minimal goals with HD  6. Dispo- now possibly going to inpatient rehab-   [place] [facility]    Labs: Basic Metabolic Panel:  Recent Labs Lab 05/25/16 0711 05/26/16 0733 05/27/16 0540  NA 131* 131* 130*  K 4.3 4.1 4.6  CL 97* 95* 94*  CO2 28 27 27   GLUCOSE 108*  156* 117*  BUN 35* 22* 38*  CREATININE 6.03* 3.98* 5.02*  CALCIUM 7.9* 8.1* 8.1*  PHOS 4.6 4.6 5.0*   Liver Function Tests:  Recent Labs Lab 05/21/16 0507 05/22/16 0446  05/24/16 0648 05/25/16 0711 05/26/16 0733 05/27/16 0540  AST 27 22  --  19  --   --   --   ALT 23 20  --  16*  --   --   --   ALKPHOS 72 72  --  72  --   --   --   BILITOT 1.2 1.2  --  1.8*  --   --   --   PROT 7.2 7.0  --  7.2  --   --   --   ALBUMIN 1.8* 1.9*  < > 1.9* 1.8* 2.0* 2.0*  < > = values in this interval not displayed. No results for input(s): LIPASE, AMYLASE in the last 168 hours. No results for input(s): AMMONIA in the last 168 hours. CBC:  Recent Labs Lab 05/20/16 0937 05/23/16 0707 05/24/16 0648 05/25/16 0710  WBC 5.5 7.5 4.8 3.6*  NEUTROABS  --   --  3.6  --   HGB 8.3* 8.4* 8.1* 7.8*  HCT 24.6* 25.6* 25.5* 24.9*  MCV 85.7 86.8 89.8 89.9  PLT 99* 78* 70* 62*   Cardiac Enzymes: No results for input(s): CKTOTAL, CKMB, CKMBINDEX, TROPONINI in the last 168 hours. CBG: No results for input(s): GLUCAP in the last 168 hours.  Iron Studies: No results for input(s): IRON, TIBC, TRANSFERRIN, FERRITIN in the last 72 hours. Studies/Results: No results found. Medications: Infusions: . sodium chloride    . sodium chloride      Scheduled Medications: . sodium chloride   Intravenous Once  . sodium chloride   Intravenous Once  . acyclovir  200 mg Oral BID  . darbepoetin (ARANESP) injection - DIALYSIS  100 mcg Intravenous Q Sat-HD  . dexamethasone  40 mg Oral Weekly  . feeding supplement  1 Container Oral BID BM  . feeding supplement (PRO-STAT SUGAR FREE 64)  30 mL Oral Q1500  . heparin subcutaneous  5,000 Units Subcutaneous Q8H  . multivitamin  1 tablet Oral QHS  . vitamin B-12  1,000 mcg Oral Daily  . Vitamin D (Ergocalciferol)  50,000 Units Oral Q7 days    have reviewed scheduled and prn medications.  Physical Exam: General: NAD Heart: RRR Lungs: clear Abdomen: soft, non  tender Extremities: no edema Dialysis Access: right sided PC and left upper arm AVF placed 6/14    05/27/2016,9:10 AM  LOS: 17 days

## 2016-05-27 NOTE — Progress Notes (Signed)
PROGRESS NOTE                                                                                                                                                                                                             Patient Demographics:    Thomas [name], is a 70 y.o. male, DOB - 15-Sep-1946, PZW:[id number]  Admit date - 05/09/2016   Admitting Physician Etta Quill, DO  Outpatient Primary MD for the patient is No PCP Per Patient  LOS - 17  Outpatient Specialists:None  Chief Complaint  Patient presents with  . low hgb        Brief Narrative  70 year old male with no significant past medical history (had not seen a physician in many years) presented with one month of nausea with daily vomiting and poor by mouth intake with subjective weight loss. He went to an urgent care where he was found to have hemoglobin of 9.8 and sent to the ED with he was found to have creatinine of >10 and BUN of 40. Patient reported having low back pain about 3 months back and was taking several NSAIDs at home. In the ED was also severely hyperkalemic with anemia and thrombocytopenia. Admitted to hospitalist service. Further workup showed diffuse skeletal hyperlucency with pathological left femur fracture with biopsy suggestive of multiple myeloma. Patient now on hemodialysis and started on chemotherapy.   Subjective:   No overnight issues. Complains of being anxious with dialysis.   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 Left UE AV fistula placed on 6/14. Has been on dialysis in 6/2. CLI P process started. Oncologist hopeful that patient's renal function should recover once his response to chemotherapy. Renal closely managing.  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 Also had pathological fracture of left midshaft femur that was repaired. bone biopsy suggest multiple myeloma. MRI of the lumbar spine shows large lumbar lesion associated with Extraosseous extension of tumor into the left paraspinous soft tissue at L5 with moderate to severe cannot stenosis. Given absence of neurological deficit neurosurgery recommends no surgical intervention. He was recommended for radiation therapy but since patient's back pain has been improving and been able to participate  with PT this has been on hold. (Was seen by radiation oncologist one week back). -Patient started on Velcade. 6/8. (Velcade subcutaneous injections on days 1,4,8,11 on 05/18/16.  . Weekly pulse dose dexamethasone starting 6/8.  Low-grade fever with cough Suspect atelectasis postop. Encourage incentive spirometry. Cough better with antitussives.   Acute pulmonary edema on chest x-ray 2-D echo Shows a normal EF with no wall motion abnormality..  Pathological fracture of left femur Status post IM nailing with open biopsy. Pain control when necessary. Outpatient follow-up with orthopedics. Seen by PT and recommends CIR. Added subcutaneous heparin at low-dose for DVT prophylaxis.  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 Currently stable.  Severe thrombocytopenia Initially due to multiple myeloma and now possibly due to chemotherapy. Stable at low levels. Received 2 units platelets postoperatively..  Recheck in a.m.  Vitamin B12 deficiency Getting supplement  Malnutrition of moderate degree Added supplement.    Code Status : Full code  Family Communication  : Wife and daughter at bedside  Disposition Plan  : Awaiting CIR placement  Barriers For Discharge :  CIR Authorization pending.  Consults  :   Nephrology Oncology ( Dr Alvy Bimler) Orthopedics Neurosurgery   Procedures  :  HD catheter placement IM nailing of left  femur Bone marrow biopsy AV fistula  DVT Prophylaxis  : Subcutaneous heparin  Lab Results  Component Value Date   PLT 62* 05/25/2016    Antibiotics  :    Anti-infectives    Start     Dose/Rate Route Frequency Ordered Stop   05/24/16 0821  vancomycin (VANCOCIN) 1-5 GM/200ML-% IVPB    Comments:  Rock, Anderson [place]   : cabinet override      05/24/16 0821 05/24/16 0920   05/17/16 1445  acyclovir (ZOVIRAX) 200 MG capsule 200 mg     200 mg Oral 2 times daily 05/17/16 1435     05/14/16 1915  clindamycin (CLEOCIN) IVPB 600 mg     600 mg 100 mL/hr over 30 Minutes Intravenous Every 6 hours 05/14/16 1912 05/15/16 0131   05/14/16 1200  clindamycin (CLEOCIN) IVPB 900 mg     900 mg 100 mL/hr over 30 Minutes Intravenous To Surgery 05/14/16 0055 05/14/16 1121   05/11/16 1225  vancomycin (VANCOCIN) 1-5 GM/200ML-% IVPB  Status:  Discontinued    Comments:  Shelton, Whitney   : cabinet override      05/11/16 1225 05/11/16 1234   05/11/16 1100  vancomycin (VANCOCIN) IVPB 1000 mg/200 mL premix     1,000 mg 200 mL/hr over 60 Minutes Intravenous To Radiology 05/11/16 0911 05/11/16 1354        Objective:   Filed Vitals:   05/27/16 1400 05/27/16 1430 05/27/16 1500 05/27/16 1530  BP: 112/56 120/59 119/59 116/59  Pulse: 76 76 80 79  Temp:      TempSrc:      Resp:      Height:      Weight:      SpO2:        Wt Readings from Last 3 Encounters:  05/27/16 71.9 kg (158 lb 8.2 oz)     Intake/Output Summary (Last 24 hours) at 05/27/16 1548 Last data filed at 05/27/16 1037  Gross per 24 hour  Intake    320 ml  Output      5 ml  Net    315 ml     Physical Exam  Gen: not in distress,  HEENT: moist mucosa, supple neck Chest:  clear b/l, no added sounds, dialysis catheter CVS: N S1&S2, no murmurs, rubs or gallop GI: soft, NT, ND, BS+ Musculoskeletal: warm, no edema, left AV fistula site appears clean, Dressing over  left thigh CNS: Alert and oriented    Data Review:    CBC  Recent  Labs Lab 05/23/16 0707 05/24/16 0648 05/25/16 0710  WBC 7.5 4.8 3.6*  HGB 8.4* 8.1* 7.8*  HCT 25.6* 25.5* 24.9*  PLT 78* 70* 62*  MCV 86.8 89.8 89.9  MCH 28.5 28.5 28.2  MCHC 32.8 31.8 31.3  RDW 14.6 15.0 15.0  LYMPHSABS  --  0.5*  --   MONOABS  --  0.6  --   EOSABS  --  0.0  --   BASOSABS  --  0.0  --     Chemistries   Recent Labs Lab 05/21/16 0507 05/22/16 0446  05/23/16 2331 05/24/16 0648 05/25/16 0711 05/26/16 0733 05/27/16 0540  NA 131* 130*  < > 130* 135 131* 131* 130*  K 3.4* 3.5  < > 4.1 4.1 4.3 4.1 4.6  CL 96* 96*  < > 97* 103 97* 95* 94*  CO2 27 26  < > 27 26 28 27 27   GLUCOSE 131* 111*  < > 137* 104* 108* 156* 117*  BUN 22* 32*  < > 22* 24* 35* 22* 38*  CREATININE 3.74* 5.05*  < > 4.07* 4.43* 6.03* 3.98* 5.02*  CALCIUM 7.8* 7.9*  < > 7.9* 7.5* 7.9* 8.1* 8.1*  AST 27 22  --   --  19  --   --   --   ALT 23 20  --   --  16*  --   --   --   ALKPHOS 72 72  --   --  72  --   --   --   BILITOT 1.2 1.2  --   --  1.8*  --   --   --   < > = values in this interval not displayed. ------------------------------------------------------------------------------------------------------------------ No results for input(s): CHOL, HDL, LDLCALC, TRIG, CHOLHDL, LDLDIRECT in the last 72 hours.  No results found for: HGBA1C ------------------------------------------------------------------------------------------------------------------ No results for input(s): TSH, T4TOTAL, T3FREE, THYROIDAB in the last 72 hours.  Invalid input(s): FREET3 ------------------------------------------------------------------------------------------------------------------ No results for input(s): VITAMINB12, FOLATE, FERRITIN, TIBC, IRON, RETICCTPCT in the last 72 hours.  Coagulation profile No results for input(s): INR, PROTIME in the last 168 hours.  No results for input(s): DDIMER in the last 72 hours.  Cardiac Enzymes No results for input(s): CKMB, TROPONINI, MYOGLOBIN in the last 168  hours.  Invalid input(s): CK ------------------------------------------------------------------------------------------------------------------ No results found for: BNP  Inpatient Medications  Scheduled Meds: . sodium chloride   Intravenous Once  . sodium chloride   Intravenous Once  . acyclovir  200 mg Oral BID  . ALPRAZolam      . calcium acetate  667 mg Oral TID WC  . darbepoetin (ARANESP) injection - DIALYSIS  100 mcg Intravenous Q Sat-HD  . dexamethasone  40 mg Oral Weekly  . feeding supplement  1 Container Oral BID BM  . feeding supplement (PRO-STAT SUGAR FREE 64)  30 mL Oral Q1500  . heparin subcutaneous  5,000 Units Subcutaneous Q8H  . multivitamin  1 tablet Oral QHS  . vitamin B-12  1,000 mcg Oral Daily  . Vitamin D (Ergocalciferol)  50,000 Units Oral Q7 days   Continuous Infusions: . sodium chloride    . sodium chloride     PRN Meds:.acetaminophen **OR** acetaminophen,  ALPRAZolam, alum & mag hydroxide-simeth, guaiFENesin-dextromethorphan, HYDROcodone-acetaminophen, hydroxypropyl methylcellulose / hypromellose, menthol-cetylpyridinium **OR** phenol, methocarbamol, metoCLOPramide **OR** metoCLOPramide (REGLAN) injection, ondansetron **OR** ondansetron (ZOFRAN) IV, ondansetron (ZOFRAN) IV, oxyCODONE-acetaminophen  Micro Results Recent Results (from the past 240 hour(s))  Culture, Urine     Status: Abnormal   Collection Time: 05/21/16  3:31 PM  Result Value Ref Range Status   Specimen Description URINE, RANDOM  Final   Special Requests NONE  Final   Culture MULTIPLE SPECIES PRESENT, SUGGEST RECOLLECTION (A)  Final   Report Status 05/23/2016 FINAL  Final    Radiology Reports Dg Skull 1-3 Views  05/11/2016  CLINICAL DATA:  Myeloma EXAM: SKULL - 1-3 VIEW COMPARISON:  None. FINDINGS: Frontal view shows 13 mm lucency in the parasagittal left skull. On the lateral view approximately 10 round or oval lucencies are identified, the largest measuring 14 mm in maximal diameter.  There are also lucencies in both mandibles. IMPRESSION: Multiple radiolucencies consistent with myeloma. Electronically Signed   By: Skipper Cliche M.D.   On: 05/11/2016 10:42   Dg Chest 2 View  05/10/2016  CLINICAL DATA:  Congestive heart failure. EXAM: CHEST  2 VIEW COMPARISON:  None. FINDINGS: The heart size and mediastinal contours are within normal limits. No pneumothorax or pleural effusion is noted. Right lung is clear. Density is seen in left lower lobe which may represent either pleural-based mass or possibly expansile lesion within left posterior rib. The visualized skeletal structures are unremarkable. IMPRESSION: Left lower lobe density is noted representing pleural-based mass or expansile lesion within left posterior rib. CT scan of the chest is recommended to evaluate for possible neoplasm or malignancy. Electronically Signed   By: Marijo Conception, M.D.   On: 05/10/2016 13:08   Dg Lumbar Spine 2-3 Views  05/10/2016  CLINICAL DATA:  Lower lumbar pain radiating down the left leg. Injury in February 2017. EXAM: LUMBAR SPINE - 2-3 VIEW COMPARISON:  None. FINDINGS: Transitional S1 vertebra. Abnormal lucency in the left L5 vertebra appreciable on the frontal projection, potentially with some slight compression in this vicinity, but with lucency out of proportion to the compression. Irregular margins of the cortex of the S1 vertebra on the lateral projection. Anterior wedge compression fracture at L3 with 30% loss of vertebral body height. Aortoiliac atherosclerotic vascular disease. IMPRESSION: 1. Compression fractures at L3 and L5, with abnormal lucency in the left side of the L5 vertebral body raising concern for multiple myeloma or lytic metastatic disease from a radiographic standpoint. MRI could be utilized for further characterization if clinically warranted. 2. There is also cortical thickening and irregularity of the S1 vertebra, underlying malignancy not excluded at S 1. 3. 30% compression  fracture at L 3, age indeterminate. 4. Lumbar spondylosis. 5.  Aortoiliac atherosclerotic vascular disease. Electronically Signed   By: Van Clines M.D.   On: 05/10/2016 16:48   US Renal  05/10/2016  CLINICAL DATA:  Acute kidney injury. EXAM: RENAL / URINARY TRACT ULTRASOUND COMPLETE COMPARISON:  None. FINDINGS: Right Kidney: Length: 11.4 cm. Echogenicity within normal limits. No mass or hydronephrosis visualized. Left Kidney: Length: 11.6 cm. Echogenicity within normal limits. There is a 2.3 x 2.1 x 2.1 cm cyst in the upper pole. No fall with mass or hydronephrosis visualized. Bladder: Appears normal for degree of bladder distention. IMPRESSION: 1. Left renal cyst.  Otherwise unremarkable renal ultrasound. 2. No obstructive uropathy. Electronically Signed   By: Jeb Levering M.D.   On: 05/10/2016 04:29   Pelvis Portable  05/14/2016  CLINICAL DATA:  Left IM nail.  Postop pain. EXAM: PORTABLE PELVIS 1-2 VIEWS COMPARISON:  None. FINDINGS: Partially images the hardware within the proximal left femur. Hip joints and SI joints are symmetric and unremarkable. No acute fracture, subluxation or dislocation. IMPRESSION: No acute bony abnormality. Electronically Signed   By: Rolm Baptise M.D.   On: 05/14/2016 15:37   Ir Fluoro Guide Cv Line Right  05/12/2016  INDICATION: 70 year old with renal failure.  Catheter needed for hemodialysis. EXAM: FLUOROSCOPIC AND ULTRASOUND GUIDED PLACEMENT OF A TUNNELED DIALYSIS CATHETER Physician: Stephan Minister. Anselm Pancoast, MD MEDICATIONS: Vancomycin 1 g; The antibiotic was administered within an appropriate time interval prior to skin puncture. ANESTHESIA/SEDATION: Versed 1.5 mg IV; Fentanyl 50 mcg IV; Moderate Sedation Time:  34 The patient was continuously monitored during the procedure by the interventional radiology nurse under my direct supervision. FLUOROSCOPY TIME:  Fluoroscopy Time: 36 seconds, 4 mGy COMPLICATIONS: None immediate. PROCEDURE: Informed consent was obtained for  placement of a tunneled dialysis catheter. The patient was placed supine on the interventional table. Ultrasound confirmed a patent right internal jugular vein. Ultrasound images were obtained for documentation. The right side of the neck was prepped and draped in a sterile fashion. The right side of the neck was anesthetized with 1% lidocaine. Maximal barrier sterile technique was utilized including caps, mask, sterile gowns, sterile gloves, sterile drape, hand hygiene and skin antiseptic. A small incision was made with #11 blade scalpel. A 21 gauge needle directed into the right internal jugular vein with ultrasound guidance. A micropuncture dilator set was placed. A 19 cm tip to cuff Palindrome catheter was selected. The skin below the right clavicle was anesthetized and a small incision was made with an #11 blade scalpel. A subcutaneous tunnel was formed to the vein dermatotomy site. The catheter was brought through the tunnel. The vein dermatotomy site was dilated to accommodate a peel-away sheath. The catheter was placed through the peel-away sheath and directed into the central venous structures. The tip of the catheter was placed at the superior cavoatrial junction with fluoroscopy. Fluoroscopic images were obtained for documentation. Both lumens were found to aspirate and flush well. The proper amount of heparin was flushed in both lumens. The vein dermatotomy site was closed using a single layer of absorbable suture and Dermabond. The catheter was secured to the skin using Prolene suture. Fluoroscopic and ultrasound images were taken and saved for documentation. IMPRESSION: Successful placement of a right jugular tunneled dialysis catheter using ultrasound and fluoroscopic guidance. Electronically Signed   By: Markus Daft M.D.   On: 05/12/2016 06:47   Ir US Guide Vasc Access Right  05/12/2016  INDICATION: 70 year old with renal failure.  Catheter needed for hemodialysis. EXAM: FLUOROSCOPIC AND ULTRASOUND  GUIDED PLACEMENT OF A TUNNELED DIALYSIS CATHETER Physician: Stephan Minister. Anselm Pancoast, MD MEDICATIONS: Vancomycin 1 g; The antibiotic was administered within an appropriate time interval prior to skin puncture. ANESTHESIA/SEDATION: Versed 1.5 mg IV; Fentanyl 50 mcg IV; Moderate Sedation Time:  34 The patient was continuously monitored during the procedure by the interventional radiology nurse under my direct supervision. FLUOROSCOPY TIME:  Fluoroscopy Time: 36 seconds, 4 mGy COMPLICATIONS: None immediate. PROCEDURE: Informed consent was obtained for placement of a tunneled dialysis catheter. The patient was placed supine on the interventional table. Ultrasound confirmed a patent right internal jugular vein. Ultrasound images were obtained for documentation. The right side of the neck was prepped and draped in a sterile fashion. The right side of the neck was anesthetized with 1%  lidocaine. Maximal barrier sterile technique was utilized including caps, mask, sterile gowns, sterile gloves, sterile drape, hand hygiene and skin antiseptic. A small incision was made with #11 blade scalpel. A 21 gauge needle directed into the right internal jugular vein with ultrasound guidance. A micropuncture dilator set was placed. A 19 cm tip to cuff Palindrome catheter was selected. The skin below the right clavicle was anesthetized and a small incision was made with an #11 blade scalpel. A subcutaneous tunnel was formed to the vein dermatotomy site. The catheter was brought through the tunnel. The vein dermatotomy site was dilated to accommodate a peel-away sheath. The catheter was placed through the peel-away sheath and directed into the central venous structures. The tip of the catheter was placed at the superior cavoatrial junction with fluoroscopy. Fluoroscopic images were obtained for documentation. Both lumens were found to aspirate and flush well. The proper amount of heparin was flushed in both lumens. The vein dermatotomy site was  closed using a single layer of absorbable suture and Dermabond. The catheter was secured to the skin using Prolene suture. Fluoroscopic and ultrasound images were taken and saved for documentation. IMPRESSION: Successful placement of a right jugular tunneled dialysis catheter using ultrasound and fluoroscopic guidance. Electronically Signed   By: Markus Daft M.D.   On: 05/12/2016 06:47   Ir US Guide Bx Asp/drain  05/12/2016  INDICATION: 70 year old with renal failure.  Request for renal biopsy. EXAM: ULTRASOUND-GUIDED RENAL BIOPSY MEDICATIONS: None. ANESTHESIA/SEDATION: Patient was monitored by radiology nurse throughout the procedure. Sedation was continued from the prior hemodialysis catheter placement. FLUOROSCOPY TIME:  None COMPLICATIONS: None immediate. PROCEDURE: Informed written consent was obtained from the patient after a thorough discussion of the procedural risks, benefits and alternatives. All questions were addressed. Maximal Sterile Barrier Technique was utilized including caps, mask, sterile gowns, sterile gloves, sterile drape, hand hygiene and skin antiseptic. A timeout was performed prior to the initiation of the procedure. Patient was placed prone. Both kidneys were evaluated with ultrasound. The right kidney was selected for biopsy. The right flank was prepped with chlorhexidine and a sterile field was created. Skin was anesthetized with 1% lidocaine. Using ultrasound guidance, 16 gauge core biopsy was obtained from the right kidney lower pole. Specimen placed in saline. A second ultrasound-guided core biopsy was obtained from the lower pole. Specimen was placed in saline. These samples were felt to be adequate. Bandage placed over the puncture site. FINDINGS: Two core biopsies obtained from the right kidney lower pole. Small perinephric hematoma identified following the procedure. This hematoma did not appear to be expanding based on post biopsy visualization. IMPRESSION: Ultrasound-guided  core biopsies of the right kidney lower pole. Electronically Signed   By: Markus Daft M.D.   On: 05/12/2016 06:54   Ct Biopsy  05/16/2016  INDICATION: 70 year old male with a history of multiple myeloma. EXAM: CT BIOPSY MEDICATIONS: None. ANESTHESIA/SEDATION: Moderate (conscious) sedation was employed during this procedure. A total of Versed 1.0 mg and Fentanyl 50 mcg was administered intravenously. Moderate Sedation Time: 15 minutes. The patient's level of consciousness and vital signs were monitored continuously by radiology nursing throughout the procedure under my direct supervision. FLUOROSCOPY TIME:  CT COMPLICATIONS: None PROCEDURE: The procedure risks, benefits, and alternatives were explained to the patient. Questions regarding the procedure were encouraged and answered. The patient understands and consents to the procedure. Scout CT of the pelvis was performed for surgical planning purposes. The posterior pelvis was prepped with Betadinein a sterile fashion, and a sterile  drape was applied covering the operative field. A sterile gown and sterile gloves were used for the procedure. Local anesthesia was provided with 1% Lidocaine. We targeted the left posterior iliac bone for biopsy. The skin and subcutaneous tissues were infiltrated with 1% lidocaine without epinephrine. A small stab incision was made with an 11 blade scalpel, and an 11 gauge Murphy needle was advanced with CT guidance to the posterior cortex. Manual forced was used to advance the needle through the posterior cortex and the stylet was removed. A bone marrow aspirate was retrieved and passed to a cytotechnologist in the room. The Murphy needle was then advanced without the stylet for a core biopsy. The core biopsy was retrieved and also passed to a cytotechnologist. Manual pressure was used for hemostasis and a sterile dressing was placed. No complications were encountered no significant blood loss was encountered. Patient tolerated the  procedure well and remained hemodynamically stable throughout. IMPRESSION: Status post CT-guided bone marrow biopsy, with tissue specimen sent to pathology for complete histopathologic analysis Signed, Dulcy Fanny. Earleen Newport, DO [facility] Radiology Electronically Signed   By: Corrie Mckusick D.O.   On: 05/16/2016 10:09   Dg Chest Port 1 View  05/24/2016  CLINICAL DATA:  Cough and congestion. EXAM: PORTABLE CHEST 1 VIEW COMPARISON:  05/17/2016. FINDINGS: Right IJ sheath in stable position. Mediastinum and hilar structures normal. Cardiomegaly with mild pulmonary venous congestion . Progressive left lower lobe infiltrate consistent with pneumonia and/or asymmetric pulmonary edema. Mild increased interstitial markings noted bilaterally. Persistent left pleural effusion. No pneumothorax. IMPRESSION: 1.  Right IJ sheath in stable position. 2. Cardiomegaly with pulmonary venous congestion and mild increase in interstitial markings noted diffusely consistent with congestive heart failure. Progressive left lower lobe infiltrate consistent with pneumonia and/or asymmetric pulmonary edema. Small left pleural effusion. Electronically Signed   By: Marcello Moores  Register   On: 05/24/2016 07:24   Dg Chest Port 1 View  05/17/2016  CLINICAL DATA:  Postop fever, left femur surgery EXAM: PORTABLE CHEST 1 VIEW COMPARISON:  05/10/2016 FINDINGS: Cardiomediastinal silhouette is stable. There is dual lumen right IJ catheter with tip in SVC right atrium junction. No pneumothorax. There is small left pleural effusion left basilar atelectasis or infiltrate. No pulmonary edema. IMPRESSION: No pulmonary edema. Small left pleural effusion with left basilar atelectasis or infiltrate. Electronically Signed   By: Lahoma Crocker M.D.   On: 05/17/2016 08:27   Dg Bone Survey Met  05/12/2016  CLINICAL DATA:  Back pain and weakness. Evaluate for multiple myeloma and bone cancer. EXAM: METASTATIC BONE SURVEY  COMPARISON:  05/10/2016 FINDINGS: Patient is edentulous. Subtle lucent areas in the calvarium are suspicious. Right jugular dialysis catheter tip in the lower SVC. No suspicious lesions in the upper extremities. Multilevel degenerativee facet disease in the cervical spine. Normal alignment of cervical spine. Prevertebral soft tissues are normal. Normal alignment of the thoracic spine. There appears to be 6 non rib-bearing vertebral bodies. Assuming that S1 is a transitional vertebral body, there is a suspicious lucent lesion along the left side of the L5 vertebral body and pedicle. Again noted is mild vertebral body height loss at L5. There is a stable superior compression deformity of the L3 vertebral body. Concern expansion of the left L1 pedicle. Cannot exclude a lesion at this location. Again noted is an expansile lesion involving the left eighth rib. This is better visualized on prior chest radiograph. Pelvic radiograph demonstrate a large lucent lesion involving the left iliac wing.  This lesion measures up to 11 cm. Heterogeneous area of lucency in the midshaft of the left femur is concerning for a lesion. There is cortical thinning in this area. No suspicious lesions in the right lower extremity. IMPRESSION: Multiple bone lesions are suggestive for metastatic bone disease and suspicious for multiple myeloma. Largest lesion is involving the left iliac wing and this would be the most amenable to biopsy. There is also an expansile lesion involving the left eighth rib and a lucent lesion involving the L5 left pedicle. Question a lesion of the left L1 pedicle. Probable small calvarial lesions. Suspicious lucency involving the midshaft of the left femur with cortical thinning. This is concerning for a metastatic bone lesion and would be at risk for a pathologic fracture. L3 compression fracture.  Questionable compression fracture at L5. These results were called by telephone at the time of interpretation on  05/12/2016 at 4:47 pm to Dr. Clementeen Graham, who verbally acknowledged these results. Electronically Signed   By: Markus Daft M.D.   On: 05/12/2016 16:47   Dg C-arm 61-120 Min  05/14/2016  CLINICAL DATA:  Elective surgery.  Left IM nail. EXAM: DG C-ARM 61-120 MIN; LEFT FEMUR 2 VIEWS COMPARISON:  05/13/2016 FINDINGS: Placement of IM nail within the left femur, bridging the permeative lesions seen on prior plain films. Hip screw in place as well. No hardware or bony complicating feature. IMPRESSION: Placement of hip screw and left femoral IM nail as above. No visible complicating feature. Electronically Signed   By: Rolm Baptise M.D.   On: 05/14/2016 13:45   Mr Total Spine Mets Screening  05/16/2016  CLINICAL DATA:  Initial evaluation for metastatic disease, presumed multiple myeloma. EXAM: MRI TOTAL SPINE WITHOUT CONTRAST TECHNIQUE: Multisequence MR imaging of the spine from the cervical spine to the sacrum was performed prior to and following IV contrast administration for evaluation of spinal metastatic disease. CONTRAST:  None. COMPARISON:  None. FINDINGS: Cervical Findings: Limited sagittal views of the cervical spine demonstrate a diffusely abnormal marrow pattern with multiple osseous lesions present, concerning for multiple myeloma. The most prevalent of these is present in the dens which demonstrates fairly diffuse abnormal STIR signal intensity (series 6, image 6). No significant extra osseous extension of tumor. No pathologic fracture. Scattered mild degenerative disc bulging throughout the cervical spine with posterior element hypertrophy. Associated diffuse canal narrowing without severe canal stenosis. No paraspinous soft tissue mass appreciated. Thoracic Findings: Limited sagittal views of the thoracic spine demonstrate diffusely abnormal marrow pattern with multiple osseous lesions present, again highly concerning for multiple myeloma. Most prominent lesion present within the T3 vertebral body and measures  19 mm. No significant extra osseous extension of tumor identified on this limited exam. No associated pathologic fracture. No paraspinous soft tissue mass appreciated. Lumbar Findings: There are 6 non rib-bearing lumbar type vertebral bodies. The lowest vertebral body is labeled L6. Vertebral body count is made from the odontoid week. Limited sagittal views of the lumbar spine demonstrate diffusely abnormal marrow pattern with innumerable osseous lesions present, highly concerning for multiple myeloma. These are present throughout the lumbar spine and sacrum. There is associated probable pathologic fracture involving the L3 vertebral body with approximately 50% of central height loss. No significant bony retropulsion. Mild compression deformity at the superior endplate of L2, likely related to chronic Schmorl's node. Large metastatic/myelomatous lesion involves the L5 vertebral body with extension into the left posterior elements. This lesion completely involves the central and left aspect of the L5 vertebral body  with destruction of the left posterior elements and extension into the paraspinous soft tissues. Lesion measures approximately 6.0 x 9.0 x 7.5 cm (series 12, image 10). Abnormal posterior convex bowing of the L5 vertebral body with moderate to severe canal stenosis (series 10, image 7). There is extra osseous extension of tumor into the adjacent left L4-5 and L5-6 neural foramina with secondary mild narrowing at L4-5 and more moderate to severe narrowing at L5-6 (series 9, image 12). A second large ovoid lesion involving the left iliac wing is partially visualized (series 12, image 13). No other definite paraspinous soft tissues mass. T2 hyperintense cyst noted within the left kidney. IMPRESSION: 1. 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 Careful correlation to the vertebral body count on this exam is recommended prior to any potential future intervention. 2.  Diffusely abnormal marrow signal intensity with innumerable osseous lesions present throughout the spine, concerning for multiple myeloma given the provided history. 3. Large 6.0 x 9.0 x 7.5 cm lesion with associated extraosseous extension of tumor into the left paraspinous soft tissues at L5. Abnormal posterior convex bowing of the L5 vertebral body with moderate to severe canal stenosis. Neural foraminal encroachment on the left at L4-5 and L5-6 as well. No other significant canal or neural foraminal encroachment within the lumbar spine. 4. No significant extraosseous tumor or or spinal canal encroachment within the cervical and thoracic spine. 5. Compression deformity of L3 with up to 50% height loss, which may be pathologic in nature. No bony retropulsion. 6. Additional large lesion with extraosseous extension of tumor involving the left iliac wing, incompletely visualized on this exam. Electronically Signed   By: Jeannine Boga M.D.   On: 05/16/2016 00:51   Dg Femur Min 2 Views Left  05/14/2016  CLINICAL DATA:  Elective surgery.  Left IM nail. EXAM: DG C-ARM 61-120 MIN; LEFT FEMUR 2 VIEWS COMPARISON:  05/13/2016 FINDINGS: Placement of IM nail within the left femur, bridging the permeative lesions seen on prior plain films. Hip screw in place as well. No hardware or bony complicating feature. IMPRESSION: Placement of hip screw and left femoral IM nail as above. No visible complicating feature. Electronically Signed   By: Rolm Baptise M.D.   On: 05/14/2016 13:45   Dg Femur Min 2 Views Left  05/13/2016  CLINICAL DATA:  Left femur pain anteriorly from the left hip to the knee. EXAM: LEFT FEMUR 2 VIEWS COMPARISON:  Bone survey 05/12/2016 FINDINGS: There is permeative lucency within the midshaft of the femur with ill-defined margins and cortical involvement as described on yesterday's study. This lesion involves a length of at least 6 cm. No displaced fracture is identified. The left hip and left knee are  located. No soft tissue abnormality is seen. IMPRESSION: Permeative left midshaft femur lesion concerning for malignancy as described on yesterday's study. No fracture identified. Electronically Signed   By: Logan Bores M.D.   On: 05/13/2016 11:40   Dg Femur Port Min 2 Views Left  05/14/2016  CLINICAL DATA:  IM nail placement.  Postop pain. EXAM: LEFT FEMUR PORTABLE 2 VIEWS COMPARISON:  05/13/2016 FINDINGS: Placement of intra medullary nail and femoral neck screw. The IM nail crosses the herniated area within the mid shaft. There is a cortical defect noted laterally, new since prior study, possibly biopsy site. No additional interval change since prior study. IMPRESSION: Intra medullary nail placed across the permeative lesion in the mid left femur. Cortical defect laterally presumably represents biopsy site. Electronically  Signed   By: Rolm Baptise M.D.   On: 05/14/2016 15:38    Time Spent in minutes  20   Louellen Molder M.D on 05/27/2016 at 3:48 PM  Between 7am to 7pm - Pager - [phone]  After 7pm go to [email or web address] - password [facility]  [facility] -  Office  [phone]

## 2016-05-28 MED ORDER — GUAIFENESIN ER 600 MG PO TB12
600.0000 mg | ORAL_TABLET | Freq: Two times a day (BID) | ORAL | Status: DC
  Administered 2016-05-28 – 2016-05-29 (×3): 600 mg via ORAL
  Filled 2016-05-28 (×3): qty 1

## 2016-05-28 NOTE — Progress Notes (Signed)
Subjective:  HD yest- removed 1 liter- also with 400 of UOP   Objective Vital signs in last 24 hours: Filed Vitals:   05/27/16 1832 05/27/16 2020 05/27/16 2101 05/28/16 0633  BP: 116/75 114/77 117/57 118/65  Pulse: 80 107 81 81  Temp: 98.4 F (36.9 C) 98.5 F (36.9 C) 98.7 F (37.1 C) 98.2 F (36.8 C)  TempSrc: Oral Oral Oral Oral  Resp: 18 18 20 18   Height:      Weight:    70.11 kg (154 lb 9 oz)  SpO2: 100% 99% 98% 97%   Weight change: -0.676 kg (-1 lb 7.8 oz)  Intake/Output Summary (Last 24 hours) at 05/28/16 0941 Last data filed at 05/28/16 0646  Gross per 24 hour  Intake    320 ml  Output   1405 ml  Net  -1085 ml    Assessment/ Plan: Pt is a 70 y.o. yo male who was admitted on 05/09/2016 with new renal failure in the setting of newly diagnosed myeloma  Assessment/Plan: 1. Renal - presenting creatinine over 10 with symptoms- no baseline information- also new diagnosis of myeloma which is the likely etiology- has been HD requiring since 5/31- doing on a TTS schedule- CLIP done- has spot TTS at [place]-  is likely ESRD although some pts will improve their renal status with treatment of their myeloma- it remains to be seen if Thomas [name] will be one of those patients-  Now s/p AVF placed on 6/14- has PC as well.  Continuing to watch for signs of recovery  2. Myeloma- new dx- started on velcade per onc on 6/8   3. 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 Secondary hyperparathyroidism- PTH low- phos 5- added phoslo 5. HTN/volume- BP lowish- no BP meds- does not seem too volume overloaded- minimal goals with HD  6. Dispo- now possibly going to inpatient rehab-   Zaylah Blecha A    Labs: Basic Metabolic Panel:  Recent Labs Lab 05/25/16 0711 05/26/16 0733 05/27/16 0540  NA 131* 131* 130*  K 4.3 4.1 4.6  CL 97* 95* 94*  CO2 28 27 27   GLUCOSE 108* 156* 117*  BUN 35* 22* 38*  CREATININE 6.03* 3.98* 5.02*  CALCIUM 7.9* 8.1* 8.1*  PHOS 4.6 4.6 5.0*    Liver Function Tests:  Recent Labs Lab 05/22/16 0446  05/24/16 0648 05/25/16 0711 05/26/16 0733 05/27/16 0540  AST 22  --  19  --   --   --   ALT 20  --  16*  --   --   --   ALKPHOS 72  --  72  --   --   --   BILITOT 1.2  --  1.8*  --   --   --   PROT 7.0  --  7.2  --   --   --   ALBUMIN 1.9*  < > 1.9* 1.8* 2.0* 2.0*  < > = values in this interval not displayed. No results for input(s): LIPASE, AMYLASE in the last 168 hours. No results for input(s): AMMONIA in the last 168 hours. CBC:  Recent Labs Lab 05/23/16 0707 05/24/16 0648 05/25/16 0710  WBC 7.5 4.8 3.6*  NEUTROABS  --  3.6  --   HGB 8.4* 8.1* 7.8*  HCT 25.6* 25.5* 24.9*  MCV 86.8 89.8 89.9  PLT 78* 70* 62*   Cardiac Enzymes: No results for input(s): CKTOTAL, CKMB, CKMBINDEX, TROPONINI in the last 168 hours. CBG: No results for  input(s): GLUCAP in the last 168 hours.  Iron Studies: No results for input(s): IRON, TIBC, TRANSFERRIN, FERRITIN in the last 72 hours. Studies/Results: No results found. Medications: Infusions: . sodium chloride    . sodium chloride      Scheduled Medications: . sodium chloride   Intravenous Once  . sodium chloride   Intravenous Once  . acyclovir  200 mg Oral BID  . calcium acetate  667 mg Oral TID WC  . darbepoetin (ARANESP) injection - DIALYSIS  100 mcg Intravenous Q Sat-HD  . dexamethasone  40 mg Oral Weekly  . feeding supplement  1 Container Oral BID BM  . feeding supplement (PRO-STAT SUGAR FREE 64)  30 mL Oral Q1500  . heparin subcutaneous  5,000 Units Subcutaneous Q8H  . multivitamin  1 tablet Oral QHS  . vitamin B-12  1,000 mcg Oral Daily  . Vitamin D (Ergocalciferol)  50,000 Units Oral Q7 days    have reviewed scheduled and prn medications.  Physical Exam: General: NAD Heart: RRR Lungs: clear Abdomen: soft, non tender Extremities: no edema Dialysis Access: right sided PC and left upper arm AVF placed 6/14    05/28/2016,9:41 AM  LOS: 18 days

## 2016-05-28 NOTE — Progress Notes (Signed)
PROGRESS NOTE                                                                                                                                                                                                             Patient Demographics:    Thomas [name], is a 70 y.o. male, DOB - 10/04/1946, PQZ:[id number]  Admit date - 05/09/2016   Admitting Physician Etta Quill, DO  Outpatient Primary MD for the patient is No PCP Per Patient  LOS - 18  Outpatient Specialists:None  Chief Complaint  Patient presents with  . low hgb        Brief Narrative  70 year old male with no significant past medical history (had not seen a physician in many years) presented with one month of nausea with daily vomiting and poor by mouth intake with subjective weight loss. He went to an urgent care where he was found to have hemoglobin of 9.8 and sent to the ED with he was found to have creatinine of >10 and BUN of 40. Patient reported having low back pain about 3 months back and was taking several NSAIDs at home. In the ED was also severely hyperkalemic with anemia and thrombocytopenia. Admitted to hospitalist service. Further workup showed diffuse skeletal hyperlucency with pathological left femur fracture with biopsy suggestive of multiple myeloma. Patient now on hemodialysis and started on chemotherapy.   Subjective:   No overnight issues.   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 Left UE AV fistula placed on 6/14. Has been on dialysis in 6/2. CLI P process started. Oncologist hopeful that patient's renal function should recover once his response to chemotherapy. Renal closely managing.  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 Also had pathological  fracture of left midshaft femur that was repaired. bone biopsy suggest multiple myeloma. MRI of the lumbar spine shows large lumbar lesion associated with Extraosseous extension of tumor into the left paraspinous soft tissue at L5 with moderate to severe cannot stenosis. Given absence of neurological deficit neurosurgery recommends no surgical intervention. He was recommended for radiation therapy but since patient's back pain has been improving and been able to participate with PT this has been on  hold. (Was seen by radiation oncologist one week back). -Patient started on Velcade. 6/8. (Velcade subcutaneous injections on days 1,4,8,11 on 05/18/16.  . Weekly pulse dose dexamethasone starting 6/8.  Low-grade fever with cough Suspect atelectasis postop. Encourage incentive spirometry. Cough better with antitussives.   Acute pulmonary edema on chest x-ray 2-D echo Shows a normal EF with no wall motion abnormality..  Pathological fracture of left femur Status post IM nailing with open biopsy. Pain control when necessary. Outpatient follow-up with orthopedics. Seen by PT and recommends CIR. Added subcutaneous heparin at low-dose for DVT prophylaxis.  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 Currently stable.  Severe thrombocytopenia Initially due to multiple myeloma and now possibly due to chemotherapy. Stable at low levels. Received 2 units platelets postoperatively..  Repeat in a.m.  Vitamin B12 deficiency Getting supplement  Malnutrition of moderate degree Added supplement.    Code Status : Full code  Family Communication  :  daughter at bedside  Disposition Plan  : Awaiting CIR placement  Barriers For Discharge :  CIR Authorization pending.  Consults  :   Nephrology Oncology ( Dr Alvy Bimler) Orthopedics Neurosurgery   Procedures  :  HD catheter placement IM nailing of left femur Bone marrow biopsy AV fistula  DVT  Prophylaxis  : Subcutaneous heparin  Lab Results  Component Value Date   PLT 62* 05/25/2016    Antibiotics  :    Anti-infectives    Start     Dose/Rate Route Frequency Ordered Stop   05/24/16 0821  vancomycin (VANCOCIN) 1-5 GM/200ML-% IVPB    Comments:  Rock, Anderson [place]   : cabinet override      05/24/16 0821 05/24/16 0920   05/17/16 1445  acyclovir (ZOVIRAX) 200 MG capsule 200 mg     200 mg Oral 2 times daily 05/17/16 1435     05/14/16 1915  clindamycin (CLEOCIN) IVPB 600 mg     600 mg 100 mL/hr over 30 Minutes Intravenous Every 6 hours 05/14/16 1912 05/15/16 0131   05/14/16 1200  clindamycin (CLEOCIN) IVPB 900 mg     900 mg 100 mL/hr over 30 Minutes Intravenous To Surgery 05/14/16 0055 05/14/16 1121   05/11/16 1225  vancomycin (VANCOCIN) 1-5 GM/200ML-% IVPB  Status:  Discontinued    Comments:  Shelton, Whitney   : cabinet override      05/11/16 1225 05/11/16 1234   05/11/16 1100  vancomycin (VANCOCIN) IVPB 1000 mg/200 mL premix     1,000 mg 200 mL/hr over 60 Minutes Intravenous To Radiology 05/11/16 0911 05/11/16 1354        Objective:   Filed Vitals:   05/27/16 2020 05/27/16 2101 05/28/16 0633 05/28/16 0900  BP: 114/77 117/57 118/65 116/75  Pulse: 107 81 81 80  Temp: 98.5 F (36.9 C) 98.7 F (37.1 C) 98.2 F (36.8 C) 98 F (36.7 C)  TempSrc: Oral Oral Oral Oral  Resp: 18 20 18 18   Height:      Weight:   70.11 kg (154 lb 9 oz)   SpO2: 99% 98% 97% 97%    Wt Readings from Last 3 Encounters:  05/28/16 70.11 kg (154 lb 9 oz)     Intake/Output Summary (Last 24 hours) at 05/28/16 1433 Last data filed at 05/28/16 1333  Gross per 24 hour  Intake    800 ml  Output   1400 ml  Net   -600 ml     Physical Exam  Gen: not in distress,  HEENT: moist mucosa, supple neck Chest: clear b/l,  dialysis catheter CVS: N S1&S2, no murmurs GI: soft, NT, ND, Musculoskeletal: warm, no edema, left AV fistula site appears clean, Dressing over  left thigh, Small area of  erythema over LLQ , no warmth or tenderness     Data Review:    CBC  Recent Labs Lab 05/23/16 0707 05/24/16 0648 05/25/16 0710  WBC 7.5 4.8 3.6*  HGB 8.4* 8.1* 7.8*  HCT 25.6* 25.5* 24.9*  PLT 78* 70* 62*  MCV 86.8 89.8 89.9  MCH 28.5 28.5 28.2  MCHC 32.8 31.8 31.3  RDW 14.6 15.0 15.0  LYMPHSABS  --  0.5*  --   MONOABS  --  0.6  --   EOSABS  --  0.0  --   BASOSABS  --  0.0  --     Chemistries   Recent Labs Lab 05/22/16 0446  05/23/16 2331 05/24/16 0648 05/25/16 0711 05/26/16 0733 05/27/16 0540  NA 130*  < > 130* 135 131* 131* 130*  K 3.5  < > 4.1 4.1 4.3 4.1 4.6  CL 96*  < > 97* 103 97* 95* 94*  CO2 26  < > 27 26 28 27 27   GLUCOSE 111*  < > 137* 104* 108* 156* 117*  BUN 32*  < > 22* 24* 35* 22* 38*  CREATININE 5.05*  < > 4.07* 4.43* 6.03* 3.98* 5.02*  CALCIUM 7.9*  < > 7.9* 7.5* 7.9* 8.1* 8.1*  AST 22  --   --  19  --   --   --   ALT 20  --   --  16*  --   --   --   ALKPHOS 72  --   --  72  --   --   --   BILITOT 1.2  --   --  1.8*  --   --   --   < > = values in this interval not displayed. ------------------------------------------------------------------------------------------------------------------ No results for input(s): CHOL, HDL, LDLCALC, TRIG, CHOLHDL, LDLDIRECT in the last 72 hours.  No results found for: HGBA1C ------------------------------------------------------------------------------------------------------------------ No results for input(s): TSH, T4TOTAL, T3FREE, THYROIDAB in the last 72 hours.  Invalid input(s): FREET3 ------------------------------------------------------------------------------------------------------------------ No results for input(s): VITAMINB12, FOLATE, FERRITIN, TIBC, IRON, RETICCTPCT in the last 72 hours.  Coagulation profile No results for input(s): INR, PROTIME in the last 168 hours.  No results for input(s): DDIMER in the last 72 hours.  Cardiac Enzymes No results for input(s): CKMB, TROPONINI,  MYOGLOBIN in the last 168 hours.  Invalid input(s): CK ------------------------------------------------------------------------------------------------------------------ No results found for: BNP  Inpatient Medications  Scheduled Meds: . sodium chloride   Intravenous Once  . sodium chloride   Intravenous Once  . acyclovir  200 mg Oral BID  . calcium acetate  667 mg Oral TID WC  . darbepoetin (ARANESP) injection - DIALYSIS  100 mcg Intravenous Q Sat-HD  . dexamethasone  40 mg Oral Weekly  . feeding supplement  1 Container Oral BID BM  . feeding supplement (PRO-STAT SUGAR FREE 64)  30 mL Oral Q1500  . guaiFENesin  600 mg Oral BID  . heparin subcutaneous  5,000 Units Subcutaneous Q8H  . multivitamin  1 tablet Oral QHS  . vitamin B-12  1,000 mcg Oral Daily  . Vitamin D (Ergocalciferol)  50,000 Units Oral Q7 days   Continuous Infusions: . sodium chloride    . sodium chloride     PRN Meds:.acetaminophen **OR** acetaminophen, ALPRAZolam, alum & mag hydroxide-simeth,  guaiFENesin-dextromethorphan, HYDROcodone-acetaminophen, hydroxypropyl methylcellulose / hypromellose, menthol-cetylpyridinium **OR** phenol, methocarbamol, metoCLOPramide **OR** metoCLOPramide (REGLAN) injection, ondansetron **OR** ondansetron (ZOFRAN) IV, ondansetron (ZOFRAN) IV, oxyCODONE-acetaminophen  Micro Results Recent Results (from the past 240 hour(s))  Culture, Urine     Status: Abnormal   Collection Time: 05/21/16  3:31 PM  Result Value Ref Range Status   Specimen Description URINE, RANDOM  Final   Special Requests NONE  Final   Culture MULTIPLE SPECIES PRESENT, SUGGEST RECOLLECTION (A)  Final   Report Status 05/23/2016 FINAL  Final    Radiology Reports Dg Skull 1-3 Views  05/11/2016  CLINICAL DATA:  Myeloma EXAM: SKULL - 1-3 VIEW COMPARISON:  None. FINDINGS: Frontal view shows 13 mm lucency in the parasagittal left skull. On the lateral view approximately 10 round or oval lucencies are identified, the  largest measuring 14 mm in maximal diameter. There are also lucencies in both mandibles. IMPRESSION: Multiple radiolucencies consistent with myeloma. Electronically Signed   By: Skipper Cliche M.D.   On: 05/11/2016 10:42   Dg Chest 2 View  05/10/2016  CLINICAL DATA:  Congestive heart failure. EXAM: CHEST  2 VIEW COMPARISON:  None. FINDINGS: The heart size and mediastinal contours are within normal limits. No pneumothorax or pleural effusion is noted. Right lung is clear. Density is seen in left lower lobe which may represent either pleural-based mass or possibly expansile lesion within left posterior rib. The visualized skeletal structures are unremarkable. IMPRESSION: Left lower lobe density is noted representing pleural-based mass or expansile lesion within left posterior rib. CT scan of the chest is recommended to evaluate for possible neoplasm or malignancy. Electronically Signed   By: Marijo Conception, M.D.   On: 05/10/2016 13:08   Dg Lumbar Spine 2-3 Views  05/10/2016  CLINICAL DATA:  Lower lumbar pain radiating down the left leg. Injury in February 2017. EXAM: LUMBAR SPINE - 2-3 VIEW COMPARISON:  None. FINDINGS: Transitional S1 vertebra. Abnormal lucency in the left L5 vertebra appreciable on the frontal projection, potentially with some slight compression in this vicinity, but with lucency out of proportion to the compression. Irregular margins of the cortex of the S1 vertebra on the lateral projection. Anterior wedge compression fracture at L3 with 30% loss of vertebral body height. Aortoiliac atherosclerotic vascular disease. IMPRESSION: 1. Compression fractures at L3 and L5, with abnormal lucency in the left side of the L5 vertebral body raising concern for multiple myeloma or lytic metastatic disease from a radiographic standpoint. MRI could be utilized for further characterization if clinically warranted. 2. There is also cortical thickening and irregularity of the S1 vertebra, underlying  malignancy not excluded at S 1. 3. 30% compression fracture at L 3, age indeterminate. 4. Lumbar spondylosis. 5.  Aortoiliac atherosclerotic vascular disease. Electronically Signed   By: Van Clines M.D.   On: 05/10/2016 16:48   US Renal  05/10/2016  CLINICAL DATA:  Acute kidney injury. EXAM: RENAL / URINARY TRACT ULTRASOUND COMPLETE COMPARISON:  None. FINDINGS: Right Kidney: Length: 11.4 cm. Echogenicity within normal limits. No mass or hydronephrosis visualized. Left Kidney: Length: 11.6 cm. Echogenicity within normal limits. There is a 2.3 x 2.1 x 2.1 cm cyst in the upper pole. No fall with mass or hydronephrosis visualized. Bladder: Appears normal for degree of bladder distention. IMPRESSION: 1. Left renal cyst.  Otherwise unremarkable renal ultrasound. 2. No obstructive uropathy. Electronically Signed   By: Jeb Levering M.D.   On: 05/10/2016 04:29   Pelvis Portable  05/14/2016  CLINICAL DATA:  Left  IM nail.  Postop pain. EXAM: PORTABLE PELVIS 1-2 VIEWS COMPARISON:  None. FINDINGS: Partially images the hardware within the proximal left femur. Hip joints and SI joints are symmetric and unremarkable. No acute fracture, subluxation or dislocation. IMPRESSION: No acute bony abnormality. Electronically Signed   By: Rolm Baptise M.D.   On: 05/14/2016 15:37   Ir Fluoro Guide Cv Line Right  05/12/2016  INDICATION: 70 year old with renal failure.  Catheter needed for hemodialysis. EXAM: FLUOROSCOPIC AND ULTRASOUND GUIDED PLACEMENT OF A TUNNELED DIALYSIS CATHETER Physician: Stephan Minister. Anselm Pancoast, MD MEDICATIONS: Vancomycin 1 g; The antibiotic was administered within an appropriate time interval prior to skin puncture. ANESTHESIA/SEDATION: Versed 1.5 mg IV; Fentanyl 50 mcg IV; Moderate Sedation Time:  34 The patient was continuously monitored during the procedure by the interventional radiology nurse under my direct supervision. FLUOROSCOPY TIME:  Fluoroscopy Time: 36 seconds, 4 mGy COMPLICATIONS: None immediate.  PROCEDURE: Informed consent was obtained for placement of a tunneled dialysis catheter. The patient was placed supine on the interventional table. Ultrasound confirmed a patent right internal jugular vein. Ultrasound images were obtained for documentation. The right side of the neck was prepped and draped in a sterile fashion. The right side of the neck was anesthetized with 1% lidocaine. Maximal barrier sterile technique was utilized including caps, mask, sterile gowns, sterile gloves, sterile drape, hand hygiene and skin antiseptic. A small incision was made with #11 blade scalpel. A 21 gauge needle directed into the right internal jugular vein with ultrasound guidance. A micropuncture dilator set was placed. A 19 cm tip to cuff Palindrome catheter was selected. The skin below the right clavicle was anesthetized and a small incision was made with an #11 blade scalpel. A subcutaneous tunnel was formed to the vein dermatotomy site. The catheter was brought through the tunnel. The vein dermatotomy site was dilated to accommodate a peel-away sheath. The catheter was placed through the peel-away sheath and directed into the central venous structures. The tip of the catheter was placed at the superior cavoatrial junction with fluoroscopy. Fluoroscopic images were obtained for documentation. Both lumens were found to aspirate and flush well. The proper amount of heparin was flushed in both lumens. The vein dermatotomy site was closed using a single layer of absorbable suture and Dermabond. The catheter was secured to the skin using Prolene suture. Fluoroscopic and ultrasound images were taken and saved for documentation. IMPRESSION: Successful placement of a right jugular tunneled dialysis catheter using ultrasound and fluoroscopic guidance. Electronically Signed   By: Markus Daft M.D.   On: 05/12/2016 06:47   Ir US Guide Vasc Access Right  05/12/2016  INDICATION: 70 year old with renal failure.  Catheter needed for  hemodialysis. EXAM: FLUOROSCOPIC AND ULTRASOUND GUIDED PLACEMENT OF A TUNNELED DIALYSIS CATHETER Physician: Stephan Minister. Anselm Pancoast, MD MEDICATIONS: Vancomycin 1 g; The antibiotic was administered within an appropriate time interval prior to skin puncture. ANESTHESIA/SEDATION: Versed 1.5 mg IV; Fentanyl 50 mcg IV; Moderate Sedation Time:  34 The patient was continuously monitored during the procedure by the interventional radiology nurse under my direct supervision. FLUOROSCOPY TIME:  Fluoroscopy Time: 36 seconds, 4 mGy COMPLICATIONS: None immediate. PROCEDURE: Informed consent was obtained for placement of a tunneled dialysis catheter. The patient was placed supine on the interventional table. Ultrasound confirmed a patent right internal jugular vein. Ultrasound images were obtained for documentation. The right side of the neck was prepped and draped in a sterile fashion. The right side of the neck was anesthetized with 1% lidocaine. Maximal barrier sterile  technique was utilized including caps, mask, sterile gowns, sterile gloves, sterile drape, hand hygiene and skin antiseptic. A small incision was made with #11 blade scalpel. A 21 gauge needle directed into the right internal jugular vein with ultrasound guidance. A micropuncture dilator set was placed. A 19 cm tip to cuff Palindrome catheter was selected. The skin below the right clavicle was anesthetized and a small incision was made with an #11 blade scalpel. A subcutaneous tunnel was formed to the vein dermatotomy site. The catheter was brought through the tunnel. The vein dermatotomy site was dilated to accommodate a peel-away sheath. The catheter was placed through the peel-away sheath and directed into the central venous structures. The tip of the catheter was placed at the superior cavoatrial junction with fluoroscopy. Fluoroscopic images were obtained for documentation. Both lumens were found to aspirate and flush well. The proper amount of heparin was flushed in  both lumens. The vein dermatotomy site was closed using a single layer of absorbable suture and Dermabond. The catheter was secured to the skin using Prolene suture. Fluoroscopic and ultrasound images were taken and saved for documentation. IMPRESSION: Successful placement of a right jugular tunneled dialysis catheter using ultrasound and fluoroscopic guidance. Electronically Signed   By: Markus Daft M.D.   On: 05/12/2016 06:47   Ir US Guide Bx Asp/drain  05/12/2016  INDICATION: 70 year old with renal failure.  Request for renal biopsy. EXAM: ULTRASOUND-GUIDED RENAL BIOPSY MEDICATIONS: None. ANESTHESIA/SEDATION: Patient was monitored by radiology nurse throughout the procedure. Sedation was continued from the prior hemodialysis catheter placement. FLUOROSCOPY TIME:  None COMPLICATIONS: None immediate. PROCEDURE: Informed written consent was obtained from the patient after a thorough discussion of the procedural risks, benefits and alternatives. All questions were addressed. Maximal Sterile Barrier Technique was utilized including caps, mask, sterile gowns, sterile gloves, sterile drape, hand hygiene and skin antiseptic. A timeout was performed prior to the initiation of the procedure. Patient was placed prone. Both kidneys were evaluated with ultrasound. The right kidney was selected for biopsy. The right flank was prepped with chlorhexidine and a sterile field was created. Skin was anesthetized with 1% lidocaine. Using ultrasound guidance, 16 gauge core biopsy was obtained from the right kidney lower pole. Specimen placed in saline. A second ultrasound-guided core biopsy was obtained from the lower pole. Specimen was placed in saline. These samples were felt to be adequate. Bandage placed over the puncture site. FINDINGS: Two core biopsies obtained from the right kidney lower pole. Small perinephric hematoma identified following the procedure. This hematoma did not appear to be expanding based on post biopsy  visualization. IMPRESSION: Ultrasound-guided core biopsies of the right kidney lower pole. Electronically Signed   By: Markus Daft M.D.   On: 05/12/2016 06:54   Ct Biopsy  05/16/2016  INDICATION: 70 year old male with a history of multiple myeloma. EXAM: CT BIOPSY MEDICATIONS: None. ANESTHESIA/SEDATION: Moderate (conscious) sedation was employed during this procedure. A total of Versed 1.0 mg and Fentanyl 50 mcg was administered intravenously. Moderate Sedation Time: 15 minutes. The patient's level of consciousness and vital signs were monitored continuously by radiology nursing throughout the procedure under my direct supervision. FLUOROSCOPY TIME:  CT COMPLICATIONS: None PROCEDURE: The procedure risks, benefits, and alternatives were explained to the patient. Questions regarding the procedure were encouraged and answered. The patient understands and consents to the procedure. Scout CT of the pelvis was performed for surgical planning purposes. The posterior pelvis was prepped with Betadinein a sterile fashion, and a sterile drape was applied covering  the operative field. A sterile gown and sterile gloves were used for the procedure. Local anesthesia was provided with 1% Lidocaine. We targeted the left posterior iliac bone for biopsy. The skin and subcutaneous tissues were infiltrated with 1% lidocaine without epinephrine. A small stab incision was made with an 11 blade scalpel, and an 11 gauge Murphy needle was advanced with CT guidance to the posterior cortex. Manual forced was used to advance the needle through the posterior cortex and the stylet was removed. A bone marrow aspirate was retrieved and passed to a cytotechnologist in the room. The Murphy needle was then advanced without the stylet for a core biopsy. The core biopsy was retrieved and also passed to a cytotechnologist. Manual pressure was used for hemostasis and a sterile dressing was placed. No complications were encountered no significant blood  loss was encountered. Patient tolerated the procedure well and remained hemodynamically stable throughout. IMPRESSION: Status post CT-guided bone marrow biopsy, with tissue specimen sent to pathology for complete histopathologic analysis Signed, Dulcy Fanny. Earleen Newport, DO [facility] Electronically Signed   By: Corrie Mckusick D.O.   On: 05/16/2016 10:09   Dg Chest Port 1 View  05/24/2016  CLINICAL DATA:  Cough and congestion. EXAM: PORTABLE CHEST 1 VIEW COMPARISON:  05/17/2016. FINDINGS: Right IJ sheath in stable position. Mediastinum and hilar structures normal. Cardiomegaly with mild pulmonary venous congestion . Progressive left lower lobe infiltrate consistent with pneumonia and/or asymmetric pulmonary edema. Mild increased interstitial markings noted bilaterally. Persistent left pleural effusion. No pneumothorax. IMPRESSION: 1.  Right IJ sheath in stable position. 2. Cardiomegaly with pulmonary venous congestion and mild increase in interstitial markings noted diffusely consistent with congestive heart failure. Progressive left lower lobe infiltrate consistent with pneumonia and/or asymmetric pulmonary edema. Small left pleural effusion. Electronically Signed   By: Marcello Moores  Register   On: 05/24/2016 07:24   Dg Chest Port 1 View  05/17/2016  CLINICAL DATA:  Postop fever, left femur surgery EXAM: PORTABLE CHEST 1 VIEW COMPARISON:  05/10/2016 FINDINGS: Cardiomediastinal silhouette is stable. There is dual lumen right IJ catheter with tip in SVC right atrium junction. No pneumothorax. There is small left pleural effusion left basilar atelectasis or infiltrate. No pulmonary edema. IMPRESSION: No pulmonary edema. Small left pleural effusion with left basilar atelectasis or infiltrate. Electronically Signed   By: Lahoma Crocker M.D.   On: 05/17/2016 08:27   Dg Bone Survey Met  05/12/2016  CLINICAL DATA:  Back pain and weakness. Evaluate for multiple myeloma and bone  cancer. EXAM: METASTATIC BONE SURVEY COMPARISON:  05/10/2016 FINDINGS: Patient is edentulous. Subtle lucent areas in the calvarium are suspicious. Right jugular dialysis catheter tip in the lower SVC. No suspicious lesions in the upper extremities. Multilevel degenerativee facet disease in the cervical spine. Normal alignment of cervical spine. Prevertebral soft tissues are normal. Normal alignment of the thoracic spine. There appears to be 6 non rib-bearing vertebral bodies. Assuming that S1 is a transitional vertebral body, there is a suspicious lucent lesion along the left side of the L5 vertebral body and pedicle. Again noted is mild vertebral body height loss at L5. There is a stable superior compression deformity of the L3 vertebral body. Concern expansion of the left L1 pedicle. Cannot exclude a lesion at this location. Again noted is an expansile lesion involving the left eighth rib. This is better visualized on prior chest radiograph. Pelvic radiograph demonstrate a large lucent lesion involving the left iliac wing. This lesion measures up  to 11 cm. Heterogeneous area of lucency in the midshaft of the left femur is concerning for a lesion. There is cortical thinning in this area. No suspicious lesions in the right lower extremity. IMPRESSION: Multiple bone lesions are suggestive for metastatic bone disease and suspicious for multiple myeloma. Largest lesion is involving the left iliac wing and this would be the most amenable to biopsy. There is also an expansile lesion involving the left eighth rib and a lucent lesion involving the L5 left pedicle. Question a lesion of the left L1 pedicle. Probable small calvarial lesions. Suspicious lucency involving the midshaft of the left femur with cortical thinning. This is concerning for a metastatic bone lesion and would be at risk for a pathologic fracture. L3 compression fracture.  Questionable compression fracture at L5. These results were called by telephone at  the time of interpretation on 05/12/2016 at 4:47 pm to Dr. Clementeen Graham, who verbally acknowledged these results. Electronically Signed   By: Markus Daft M.D.   On: 05/12/2016 16:47   Dg C-arm 61-120 Min  05/14/2016  CLINICAL DATA:  Elective surgery.  Left IM nail. EXAM: DG C-ARM 61-120 MIN; LEFT FEMUR 2 VIEWS COMPARISON:  05/13/2016 FINDINGS: Placement of IM nail within the left femur, bridging the permeative lesions seen on prior plain films. Hip screw in place as well. No hardware or bony complicating feature. IMPRESSION: Placement of hip screw and left femoral IM nail as above. No visible complicating feature. Electronically Signed   By: Rolm Baptise M.D.   On: 05/14/2016 13:45   Mr Total Spine Mets Screening  05/16/2016  CLINICAL DATA:  Initial evaluation for metastatic disease, presumed multiple myeloma. EXAM: MRI TOTAL SPINE WITHOUT CONTRAST TECHNIQUE: Multisequence MR imaging of the spine from the cervical spine to the sacrum was performed prior to and following IV contrast administration for evaluation of spinal metastatic disease. CONTRAST:  None. COMPARISON:  None. FINDINGS: Cervical Findings: Limited sagittal views of the cervical spine demonstrate a diffusely abnormal marrow pattern with multiple osseous lesions present, concerning for multiple myeloma. The most prevalent of these is present in the dens which demonstrates fairly diffuse abnormal STIR signal intensity (series 6, image 6). No significant extra osseous extension of tumor. No pathologic fracture. Scattered mild degenerative disc bulging throughout the cervical spine with posterior element hypertrophy. Associated diffuse canal narrowing without severe canal stenosis. No paraspinous soft tissue mass appreciated. Thoracic Findings: Limited sagittal views of the thoracic spine demonstrate diffusely abnormal marrow pattern with multiple osseous lesions present, again highly concerning for multiple myeloma. Most prominent lesion present within the T3  vertebral body and measures 19 mm. No significant extra osseous extension of tumor identified on this limited exam. No associated pathologic fracture. No paraspinous soft tissue mass appreciated. Lumbar Findings: There are 6 non rib-bearing lumbar type vertebral bodies. The lowest vertebral body is labeled L6. Vertebral body count is made from the odontoid week. Limited sagittal views of the lumbar spine demonstrate diffusely abnormal marrow pattern with innumerable osseous lesions present, highly concerning for multiple myeloma. These are present throughout the lumbar spine and sacrum. There is associated probable pathologic fracture involving the L3 vertebral body with approximately 50% of central height loss. No significant bony retropulsion. Mild compression deformity at the superior endplate of L2, likely related to chronic Schmorl's node. Large metastatic/myelomatous lesion involves the L5 vertebral body with extension into the left posterior elements. This lesion completely involves the central and left aspect of the L5 vertebral body with destruction of the  left posterior elements and extension into the paraspinous soft tissues. Lesion measures approximately 6.0 x 9.0 x 7.5 cm (series 12, image 10). Abnormal posterior convex bowing of the L5 vertebral body with moderate to severe canal stenosis (series 10, image 7). There is extra osseous extension of tumor into the adjacent left L4-5 and L5-6 neural foramina with secondary mild narrowing at L4-5 and more moderate to severe narrowing at L5-6 (series 9, image 12). A second large ovoid lesion involving the left iliac wing is partially visualized (series 12, image 13). No other definite paraspinous soft tissues mass. T2 hyperintense cyst noted within the left kidney. IMPRESSION: 1. 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 Careful correlation to the vertebral body count on this exam is recommended prior to any  potential future intervention. 2. Diffusely abnormal marrow signal intensity with innumerable osseous lesions present throughout the spine, concerning for multiple myeloma given the provided history. 3. Large 6.0 x 9.0 x 7.5 cm lesion with associated extraosseous extension of tumor into the left paraspinous soft tissues at L5. Abnormal posterior convex bowing of the L5 vertebral body with moderate to severe canal stenosis. Neural foraminal encroachment on the left at L4-5 and L5-6 as well. No other significant canal or neural foraminal encroachment within the lumbar spine. 4. No significant extraosseous tumor or or spinal canal encroachment within the cervical and thoracic spine. 5. Compression deformity of L3 with up to 50% height loss, which may be pathologic in nature. No bony retropulsion. 6. Additional large lesion with extraosseous extension of tumor involving the left iliac wing, incompletely visualized on this exam. Electronically Signed   By: Jeannine Boga M.D.   On: 05/16/2016 00:51   Dg Femur Min 2 Views Left  05/14/2016  CLINICAL DATA:  Elective surgery.  Left IM nail. EXAM: DG C-ARM 61-120 MIN; LEFT FEMUR 2 VIEWS COMPARISON:  05/13/2016 FINDINGS: Placement of IM nail within the left femur, bridging the permeative lesions seen on prior plain films. Hip screw in place as well. No hardware or bony complicating feature. IMPRESSION: Placement of hip screw and left femoral IM nail as above. No visible complicating feature. Electronically Signed   By: Rolm Baptise M.D.   On: 05/14/2016 13:45   Dg Femur Min 2 Views Left  05/13/2016  CLINICAL DATA:  Left femur pain anteriorly from the left hip to the knee. EXAM: LEFT FEMUR 2 VIEWS COMPARISON:  Bone survey 05/12/2016 FINDINGS: There is permeative lucency within the midshaft of the femur with ill-defined margins and cortical involvement as described on yesterday's study. This lesion involves a length of at least 6 cm. No displaced fracture is  identified. The left hip and left knee are located. No soft tissue abnormality is seen. IMPRESSION: Permeative left midshaft femur lesion concerning for malignancy as described on yesterday's study. No fracture identified. Electronically Signed   By: Logan Bores M.D.   On: 05/13/2016 11:40   Dg Femur Port Min 2 Views Left  05/14/2016  CLINICAL DATA:  IM nail placement.  Postop pain. EXAM: LEFT FEMUR PORTABLE 2 VIEWS COMPARISON:  05/13/2016 FINDINGS: Placement of intra medullary nail and femoral neck screw. The IM nail crosses the herniated area within the mid shaft. There is a cortical defect noted laterally, new since prior study, possibly biopsy site. No additional interval change since prior study. IMPRESSION: Intra medullary nail placed across the permeative lesion in the mid left femur. Cortical defect laterally presumably represents biopsy site. Electronically Signed   By:  Rolm Baptise M.D.   On: 05/14/2016 15:38    Time Spent in minutes  20   Louellen Molder M.D on 05/28/2016 at 2:33 PM  Between 7am to 7pm - Pager - [phone]  After 7pm go to [email or web address] - password [facility]  [facility] -  Office  [phone]

## 2016-05-29 ENCOUNTER — Encounter (HOSPITAL_COMMUNITY): Payer: Self-pay | Admitting: *Deleted

## 2016-05-29 ENCOUNTER — Inpatient Hospital Stay (HOSPITAL_COMMUNITY)
Admission: RE | Admit: 2016-05-29 | Discharge: 2016-06-08 | DRG: 559 | Disposition: A | Discharge status: Home Health | Payer: PPO | Source: Intra-hospital | Attending: Physical Medicine & Rehabilitation | Admitting: Physical Medicine & Rehabilitation

## 2016-05-29 ENCOUNTER — Other Ambulatory Visit: Payer: Self-pay | Admitting: Hematology and Oncology

## 2016-05-29 DIAGNOSIS — E538 Deficiency of other specified B group vitamins: Secondary | ICD-10-CM | POA: Diagnosis present

## 2016-05-29 DIAGNOSIS — L89159 Pressure ulcer of sacral region, unspecified stage: Secondary | ICD-10-CM | POA: Diagnosis present

## 2016-05-29 DIAGNOSIS — R197 Diarrhea, unspecified: Secondary | ICD-10-CM | POA: Diagnosis present

## 2016-05-29 DIAGNOSIS — Z885 Allergy status to narcotic agent status: Secondary | ICD-10-CM | POA: Diagnosis not present

## 2016-05-29 DIAGNOSIS — D638 Anemia in other chronic diseases classified elsewhere: Secondary | ICD-10-CM | POA: Diagnosis not present

## 2016-05-29 DIAGNOSIS — L8990 Pressure ulcer of unspecified site, unspecified stage: Secondary | ICD-10-CM | POA: Insufficient documentation

## 2016-05-29 DIAGNOSIS — E8770 Fluid overload, unspecified: Secondary | ICD-10-CM | POA: Diagnosis present

## 2016-05-29 DIAGNOSIS — M545 Low back pain, unspecified: Secondary | ICD-10-CM | POA: Insufficient documentation

## 2016-05-29 DIAGNOSIS — F419 Anxiety disorder, unspecified: Secondary | ICD-10-CM | POA: Diagnosis present

## 2016-05-29 DIAGNOSIS — Z880 Allergy status to penicillin: Secondary | ICD-10-CM

## 2016-05-29 DIAGNOSIS — K591 Functional diarrhea: Secondary | ICD-10-CM | POA: Insufficient documentation

## 2016-05-29 DIAGNOSIS — G8918 Other acute postprocedural pain: Secondary | ICD-10-CM | POA: Insufficient documentation

## 2016-05-29 DIAGNOSIS — Z992 Dependence on renal dialysis: Secondary | ICD-10-CM | POA: Diagnosis not present

## 2016-05-29 DIAGNOSIS — I120 Hypertensive chronic kidney disease with stage 5 chronic kidney disease or end stage renal disease: Secondary | ICD-10-CM | POA: Diagnosis present

## 2016-05-29 DIAGNOSIS — S72302A Unspecified fracture of shaft of left femur, initial encounter for closed fracture: Secondary | ICD-10-CM | POA: Insufficient documentation

## 2016-05-29 DIAGNOSIS — C9000 Multiple myeloma not having achieved remission: Secondary | ICD-10-CM | POA: Diagnosis present

## 2016-05-29 DIAGNOSIS — K3184 Gastroparesis: Secondary | ICD-10-CM

## 2016-05-29 DIAGNOSIS — Z87891 Personal history of nicotine dependence: Secondary | ICD-10-CM | POA: Diagnosis not present

## 2016-05-29 DIAGNOSIS — K2990 Gastroduodenitis, unspecified, without bleeding: Secondary | ICD-10-CM

## 2016-05-29 DIAGNOSIS — I959 Hypotension, unspecified: Secondary | ICD-10-CM | POA: Insufficient documentation

## 2016-05-29 DIAGNOSIS — D62 Acute posthemorrhagic anemia: Secondary | ICD-10-CM | POA: Diagnosis present

## 2016-05-29 DIAGNOSIS — N08 Glomerular disorders in diseases classified elsewhere: Secondary | ICD-10-CM

## 2016-05-29 DIAGNOSIS — E559 Vitamin D deficiency, unspecified: Secondary | ICD-10-CM

## 2016-05-29 DIAGNOSIS — N186 End stage renal disease: Secondary | ICD-10-CM | POA: Diagnosis present

## 2016-05-29 DIAGNOSIS — M6250 Muscle wasting and atrophy, not elsewhere classified, unspecified site: Secondary | ICD-10-CM | POA: Diagnosis present

## 2016-05-29 DIAGNOSIS — I10 Essential (primary) hypertension: Secondary | ICD-10-CM

## 2016-05-29 DIAGNOSIS — D61818 Other pancytopenia: Secondary | ICD-10-CM | POA: Diagnosis not present

## 2016-05-29 DIAGNOSIS — F4323 Adjustment disorder with mixed anxiety and depressed mood: Secondary | ICD-10-CM | POA: Diagnosis not present

## 2016-05-29 DIAGNOSIS — M84452D Pathological fracture, left femur, subsequent encounter for fracture with routine healing: Principal | ICD-10-CM | POA: Diagnosis present

## 2016-05-29 DIAGNOSIS — N2889 Other specified disorders of kidney and ureter: Secondary | ICD-10-CM

## 2016-05-29 DIAGNOSIS — K2970 Gastritis, unspecified, without bleeding: Secondary | ICD-10-CM

## 2016-05-29 DIAGNOSIS — R5381 Other malaise: Secondary | ICD-10-CM | POA: Insufficient documentation

## 2016-05-29 DIAGNOSIS — D6189 Other specified aplastic anemias and other bone marrow failure syndromes: Secondary | ICD-10-CM

## 2016-05-29 DIAGNOSIS — S72302S Unspecified fracture of shaft of left femur, sequela: Secondary | ICD-10-CM

## 2016-05-29 DIAGNOSIS — D519 Vitamin B12 deficiency anemia, unspecified: Secondary | ICD-10-CM | POA: Diagnosis not present

## 2016-05-29 DIAGNOSIS — D696 Thrombocytopenia, unspecified: Secondary | ICD-10-CM | POA: Diagnosis present

## 2016-05-29 DIAGNOSIS — N2581 Secondary hyperparathyroidism of renal origin: Secondary | ICD-10-CM | POA: Diagnosis present

## 2016-05-29 DIAGNOSIS — K5900 Constipation, unspecified: Secondary | ICD-10-CM

## 2016-05-29 DIAGNOSIS — N189 Chronic kidney disease, unspecified: Secondary | ICD-10-CM | POA: Diagnosis not present

## 2016-05-29 DIAGNOSIS — N19 Unspecified kidney failure: Secondary | ICD-10-CM | POA: Diagnosis not present

## 2016-05-29 DIAGNOSIS — S72302D Unspecified fracture of shaft of left femur, subsequent encounter for closed fracture with routine healing: Secondary | ICD-10-CM | POA: Diagnosis not present

## 2016-05-29 HISTORY — DX: Chronic kidney disease, unspecified: N18.9

## 2016-05-29 LAB — CBC WITH DIFFERENTIAL/PLATELET
Basophils Absolute: 0 10*3/uL (ref 0.0–0.1)
Basophils Relative: 0 %
EOS ABS: 0 10*3/uL (ref 0.0–0.7)
EOS PCT: 0 %
HCT: 27 % — ABNORMAL LOW (ref 39.0–52.0)
Hemoglobin: 8.6 g/dL — ABNORMAL LOW (ref 13.0–17.0)
LYMPHS ABS: 0.8 10*3/uL (ref 0.7–4.0)
Lymphocytes Relative: 33 %
MCH: 28.6 pg (ref 26.0–34.0)
MCHC: 31.9 g/dL (ref 30.0–36.0)
MCV: 89.7 fL (ref 78.0–100.0)
MONO ABS: 0.4 10*3/uL (ref 0.1–1.0)
MONOS PCT: 14 %
Neutro Abs: 1.3 10*3/uL — ABNORMAL LOW (ref 1.7–7.7)
Neutrophils Relative %: 53 %
PLATELETS: 47 10*3/uL — AB (ref 150–400)
RBC: 3.01 MIL/uL — AB (ref 4.22–5.81)
RDW: 14.8 % (ref 11.5–15.5)
WBC: 2.5 10*3/uL — AB (ref 4.0–10.5)

## 2016-05-29 LAB — COMPREHENSIVE METABOLIC PANEL
ALT: 31 U/L (ref 17–63)
ANION GAP: 7 (ref 5–15)
AST: 37 U/L (ref 15–41)
Albumin: 2 g/dL — ABNORMAL LOW (ref 3.5–5.0)
Alkaline Phosphatase: 90 U/L (ref 38–126)
BUN: 35 mg/dL — ABNORMAL HIGH (ref 6–20)
CHLORIDE: 96 mmol/L — AB (ref 101–111)
CO2: 28 mmol/L (ref 22–32)
Calcium: 7.9 mg/dL — ABNORMAL LOW (ref 8.9–10.3)
Creatinine, Ser: 4.64 mg/dL — ABNORMAL HIGH (ref 0.61–1.24)
GFR calc non Af Amer: 12 mL/min — ABNORMAL LOW (ref >60)
GFR, EST AFRICAN AMERICAN: 14 mL/min — AB (ref >60)
Glucose, Bld: 119 mg/dL — ABNORMAL HIGH (ref 65–99)
POTASSIUM: 3.8 mmol/L (ref 3.5–5.1)
SODIUM: 131 mmol/L — AB (ref 135–145)
Total Bilirubin: 1 mg/dL (ref 0.3–1.2)
Total Protein: 6.7 g/dL (ref 6.5–8.1)

## 2016-05-29 LAB — CHROMOSOME ANALYSIS, BONE MARROW

## 2016-05-29 LAB — TISSUE HYBRIDIZATION (BONE MARROW)-NCBH

## 2016-05-29 MED ORDER — DEXAMETHASONE 6 MG PO TABS
40.0000 mg | ORAL_TABLET | ORAL | Status: DC
  Administered 2016-06-01: 40 mg via ORAL
  Filled 2016-05-29 (×2): qty 1

## 2016-05-29 MED ORDER — METHOCARBAMOL 500 MG PO TABS
250.0000 mg | ORAL_TABLET | Freq: Four times a day (QID) | ORAL | Status: DC | PRN
  Administered 2016-05-29: 250 mg via ORAL
  Filled 2016-05-29: qty 1

## 2016-05-29 MED ORDER — HYPROMELLOSE (GONIOSCOPIC) 2.5 % OP SOLN: 2.0000 [drp] | Freq: Four times a day (QID) | OPHTHALMIC | Status: DC | PRN

## 2016-05-29 MED ORDER — CYANOCOBALAMIN 1000 MCG PO TABS: 1000.0000 ug | ORAL_TABLET | Freq: Every day | ORAL | Status: DC

## 2016-05-29 MED ORDER — VITAMIN B-12 1000 MCG PO TABS
1000.0000 ug | ORAL_TABLET | Freq: Every day | ORAL | Status: DC
  Administered 2016-05-30 – 2016-06-08 (×10): 1000 ug via ORAL
  Filled 2016-05-29 (×10): qty 1

## 2016-05-29 MED ORDER — BISACODYL 10 MG RE SUPP
10.0000 mg | Freq: Every day | RECTAL | Status: DC | PRN
  Filled 2016-05-29 (×2): qty 1

## 2016-05-29 MED ORDER — ACYCLOVIR 200 MG PO CAPS
200.0000 mg | ORAL_CAPSULE | Freq: Two times a day (BID) | ORAL | Status: DC
  Administered 2016-05-29 – 2016-06-08 (×20): 200 mg via ORAL
  Filled 2016-05-29 (×21): qty 1

## 2016-05-29 MED ORDER — TRAZODONE HCL 50 MG PO TABS
25.0000 mg | ORAL_TABLET | Freq: Every evening | ORAL | Status: DC | PRN
  Filled 2016-05-29 (×2): qty 1

## 2016-05-29 MED ORDER — HEPARIN SODIUM (PORCINE) 5000 UNIT/ML IJ SOLN
5000.0000 [IU] | Freq: Three times a day (TID) | INTRAMUSCULAR | Status: DC
  Administered 2016-05-30 – 2016-06-03 (×10): 5000 [IU] via SUBCUTANEOUS
  Filled 2016-05-29 (×13): qty 1

## 2016-05-29 MED ORDER — ONDANSETRON HCL 4 MG PO TABS
4.0000 mg | ORAL_TABLET | Freq: Four times a day (QID) | ORAL | Status: DC | PRN
  Administered 2016-05-30 – 2016-05-31 (×3): 4 mg via ORAL
  Filled 2016-05-29 (×3): qty 1

## 2016-05-29 MED ORDER — HYPROMELLOSE (GONIOSCOPIC) 2.5 % OP SOLN
2.0000 [drp] | Freq: Four times a day (QID) | OPHTHALMIC | Status: DC | PRN
  Filled 2016-05-29: qty 15

## 2016-05-29 MED ORDER — VITAMIN D (ERGOCALCIFEROL) 1.25 MG (50000 UNIT) PO CAPS
50000.0000 [IU] | ORAL_CAPSULE | ORAL | Status: DC
  Administered 2016-06-01 – 2016-06-08 (×2): 50000 [IU] via ORAL
  Filled 2016-05-29 (×2): qty 1

## 2016-05-29 MED ORDER — GUAIFENESIN ER 600 MG PO TB12
600.0000 mg | ORAL_TABLET | Freq: Two times a day (BID) | ORAL | Status: DC
  Administered 2016-05-29 – 2016-06-02 (×9): 600 mg via ORAL
  Filled 2016-05-29 (×9): qty 1

## 2016-05-29 MED ORDER — HEPARIN SODIUM (PORCINE) 5000 UNIT/ML IJ SOLN: 5000.0000 [IU] | Freq: Three times a day (TID) | INTRAMUSCULAR | Status: DC

## 2016-05-29 MED ORDER — ALPRAZOLAM 0.25 MG PO TABS
0.2500 mg | ORAL_TABLET | Freq: Two times a day (BID) | ORAL | Status: DC | PRN
  Administered 2016-05-30 – 2016-06-08 (×5): 0.25 mg via ORAL
  Filled 2016-05-29 (×2): qty 1

## 2016-05-29 MED ORDER — HYDROCODONE-ACETAMINOPHEN 5-325 MG PO TABS: 1.0000 | ORAL_TABLET | Freq: Four times a day (QID) | ORAL | Status: DC | PRN

## 2016-05-29 MED ORDER — DARBEPOETIN ALFA 100 MCG/0.5ML IJ SOSY: 100.0000 ug | PREFILLED_SYRINGE | INTRAMUSCULAR | Status: DC

## 2016-05-29 MED ORDER — GUAIFENESIN-DM 100-10 MG/5ML PO SYRP: 5.0000 mL | ORAL_SOLUTION | ORAL | Status: DC | PRN

## 2016-05-29 MED ORDER — ALPRAZOLAM 0.25 MG PO TABS: 0.2500 mg | ORAL_TABLET | Freq: Two times a day (BID) | ORAL | Status: DC | PRN

## 2016-05-29 MED ORDER — ACETAMINOPHEN 325 MG PO TABS: 650.0000 mg | ORAL_TABLET | Freq: Four times a day (QID) | ORAL | Status: AC | PRN

## 2016-05-29 MED ORDER — ALUMINUM HYDROXIDE GEL 320 MG/5ML PO SUSP
10.0000 mL | Freq: Four times a day (QID) | ORAL | Status: DC | PRN
Start: 2016-05-29 — End: 2016-06-08
  Filled 2016-05-29: qty 30

## 2016-05-29 MED ORDER — CALCIUM ACETATE (PHOS BINDER) 667 MG PO CAPS
667.0000 mg | ORAL_CAPSULE | Freq: Three times a day (TID) | ORAL | Status: DC
  Administered 2016-05-29 – 2016-06-04 (×13): 667 mg via ORAL
  Filled 2016-05-29 (×14): qty 1

## 2016-05-29 MED ORDER — CALCIUM ACETATE (PHOS BINDER) 667 MG PO CAPS: 667.0000 mg | ORAL_CAPSULE | Freq: Three times a day (TID) | ORAL | Status: DC

## 2016-05-29 MED ORDER — DARBEPOETIN ALFA 100 MCG/0.5ML IJ SOSY
100.0000 ug | PREFILLED_SYRINGE | INTRAMUSCULAR | Status: DC
  Administered 2016-06-03: 100 ug via INTRAVENOUS
  Filled 2016-05-29: qty 0.5

## 2016-05-29 MED ORDER — METHOCARBAMOL 500 MG PO TABS: 250.0000 mg | ORAL_TABLET | Freq: Four times a day (QID) | ORAL | Status: DC | PRN

## 2016-05-29 MED ORDER — ALUM & MAG HYDROXIDE-SIMETH 200-200-20 MG/5ML PO SUSP: 30.0000 mL | ORAL | Status: DC | PRN

## 2016-05-29 MED ORDER — RENA-VITE PO TABS: 1.0000 | ORAL_TABLET | Freq: Every day | ORAL | Status: DC

## 2016-05-29 MED ORDER — GUAIFENESIN-DM 100-10 MG/5ML PO SYRP
5.0000 mL | ORAL_SOLUTION | Freq: Four times a day (QID) | ORAL | Status: DC | PRN
  Administered 2016-05-29 – 2016-05-30 (×2): 10 mL via ORAL
  Filled 2016-05-29 (×2): qty 10

## 2016-05-29 MED ORDER — ACYCLOVIR 200 MG PO CAPS: 200.0000 mg | ORAL_CAPSULE | Freq: Two times a day (BID) | ORAL | Status: DC

## 2016-05-29 MED ORDER — DIPHENHYDRAMINE HCL 12.5 MG/5ML PO ELIX: 12.5000 mg | ORAL_SOLUTION | Freq: Four times a day (QID) | ORAL | Status: DC | PRN

## 2016-05-29 MED ORDER — GUAIFENESIN ER 600 MG PO TB12: 600.0000 mg | ORAL_TABLET | Freq: Two times a day (BID) | ORAL | Status: DC

## 2016-05-29 MED ORDER — VITAMIN D (ERGOCALCIFEROL) 1.25 MG (50000 UNIT) PO CAPS
50000.0000 [IU] | ORAL_CAPSULE | ORAL | Status: DC
Start: 2016-05-25 — End: 2016-06-08

## 2016-05-29 MED ORDER — ONDANSETRON HCL 4 MG/2ML IJ SOLN
4.0000 mg | Freq: Four times a day (QID) | INTRAMUSCULAR | Status: DC | PRN
  Administered 2016-05-31 – 2016-06-01 (×3): 4 mg via INTRAVENOUS
  Filled 2016-05-29 (×3): qty 2

## 2016-05-29 MED ORDER — BOOST / RESOURCE BREEZE PO LIQD: 1.0000 | Freq: Two times a day (BID) | ORAL | Status: DC

## 2016-05-29 MED ORDER — DEXAMETHASONE 4 MG PO TABS: 40.0000 mg | ORAL_TABLET | ORAL | Status: DC

## 2016-05-29 MED ORDER — OXYCODONE-ACETAMINOPHEN 5-325 MG PO TABS
1.0000 | ORAL_TABLET | Freq: Four times a day (QID) | ORAL | Status: DC | PRN
  Administered 2016-05-30 – 2016-05-31 (×2): 1 via ORAL
  Filled 2016-05-29 (×2): qty 1

## 2016-05-29 MED ORDER — RENA-VITE PO TABS
1.0000 | ORAL_TABLET | Freq: Every day | ORAL | Status: DC
  Administered 2016-05-29 – 2016-06-07 (×10): 1 via ORAL
  Filled 2016-05-29 (×10): qty 1

## 2016-05-29 MED ORDER — PRO-STAT SUGAR FREE PO LIQD: 30.0000 mL | Freq: Every day | ORAL | Status: DC

## 2016-05-29 NOTE — Progress Notes (Signed)
Retta Diones, RN Rehab Admission Coordinator Signed Physical Medicine and Rehabilitation PMR Pre-admission 05/26/2016 10:54 AM  Related encounter: ED to Hosp-Admission (Current) from 05/09/2016 in [place] Collapse All   PMR Admission Coordinator Pre-Admission Assessment  Patient: Thomas [name] is an 71 y.o., male MRN: [id number] DOB: [birth year] Height: 6' 1"  (185.4 cm) Weight: 70.11 kg (154 lb 9 oz)  Insurance Information HMO: PPO: Yes PCP: IPA: 80/20: OTHER:  PRIMARY: [employer] Policy#: [id number] Subscriber: [name] CM Name: Margreta Journey Phone#: Fax#: [phone] Pre-Cert#: [id number] Employer: Retired Benefits: Phone #: [phone] Name: Emmit Pomfret. Date: 12/12/15 Deduct: $0 Out of Pocket Max: $3400 (met $ 70.00) Life Max: unlimited CIR: $225 days 1-6 SNF: $0 days 1-20; $150 days 21-100 Outpatient: medical necessity Co-Pay: $15 Home Health: with authorization Co-Pay: $25 copay DME: 80% Co-Pay: 20% Providers: in network  Emergency [place]    Name Relation Home Work Mobile   [place] Spouse [phone]  [phone]   Renzo, Vincelette Daughter   [phone]   [facility] Daughter   [phone]     Current Medical History  Patient Admitting Diagnosis: Pathologic fracture left femur   History of Present Illness: A 70 y.o. (no medical follow up) with onset of back pain March 2017 treated with NSAIDS and conservative care. He was admitted via ED 5/31 with nausea due to uremia, renal failure with BUN/Cr- 61/10.5 and anemia. Dr. Cardale Footman consulted and HD initiated. Bone scan done due to complaints to severe back pain and showed  multiple bony lesions suspicious for MM--largest in left iliac wing, L3 compression fracture and question L5 compression fracture, and left mid shaft lesion with thinning at risk for pathologic fracture. He underwent IM fixation of left femur on 06/4 by Dr. Lyla Glassing. WBAT LLE with spica wrap left hip. 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 ABLA treated with transfusion and thrombocytopenia being monitored.   Dr. Tammi Klippel consulted and recommended palliative XRT to spine and left femur. Dr. Alvy Bimler consulted for treatment options and patient started on Velcade injections days 1,4,8 and 11 on 06/8. To have weekly pulse decadron 40 mg in conjunction with injections. L-AVF placed by Dr. Bridgett Larsson on 6/14 and HD ongoing on TTS. Blood pressures on low side but fluid overload noted and low grade fevers noted on 06/15. 2 D echo with EF 60%-65% and no wall abnormality. Patient to be admitted for a comprehensive inpatient rehabilitation program.  Past Medical History  Past Medical History  Diagnosis Date  . Compression fracture     Family History  family history includes Diabetes in his father.  Prior Rehab/Hospitalizations: No previous rehab  Has the patient had major surgery during 100 days prior to admission? No. However, patient does report that he had teeth extractions about a year ago.  Current Medications   Current facility-administered medications:  . 0.9 % sodium chloride infusion, , Intravenous, Continuous, Adam Hodierne, MD . 0.9 % sodium chloride infusion, , Intravenous, Continuous, Jaclyn M Bissell, PA-C . 0.9 % sodium chloride infusion, , Intravenous, Once, Rod Can, MD . 0.9 % sodium chloride infusion, , Intravenous, Once, Janece Canterbury, MD . acetaminophen (TYLENOL) tablet 650 mg, 650 mg, Oral, Q6H PRN **OR** acetaminophen (TYLENOL) suppository 650 mg, 650 mg, Rectal, Q6H PRN, Cecilie Kicks, PA-C . acyclovir  (ZOVIRAX) 200 MG capsule 200 mg, 200 mg, Oral, BID, Ni Gorsuch, MD, 200 mg at 05/29/16 1005 . ALPRAZolam (XANAX) tablet 0.25 mg,  0.25 mg, Oral, BID PRN, Corliss Parish, MD, 0.25 mg at 05/27/16 1315 . alum & mag hydroxide-simeth (MAALOX/MYLANTA) 200-200-20 MG/5ML suspension 30 mL, 30 mL, Oral, Q4H PRN, Cecilie Kicks, PA-C . calcium acetate (PHOSLO) capsule 667 mg, 667 mg, Oral, TID WC, Corliss Parish, MD, 667 mg at 05/29/16 0755 . Darbepoetin Alfa (ARANESP) injection 100 mcg, 100 mcg, Intravenous, Q Sat-HD, Modena Jansky, MD, 100 mcg at 05/20/16 1132 . dexamethasone (DECADRON) tablet 40 mg, 40 mg, Oral, Weekly, Heath Lark, MD, 40 mg at 05/25/16 1252 . feeding supplement (BOOST / RESOURCE BREEZE) liquid 1 Container, 1 Container, Oral, BID BM, Nishant Dhungel, MD, 1 Container at 05/29/16 1005 . feeding supplement (PRO-STAT SUGAR FREE 64) liquid 30 mL, 30 mL, Oral, Q1500, Nishant Dhungel, MD, 30 mL at 05/28/16 1257 . guaiFENesin (MUCINEX) 12 hr tablet 600 mg, 600 mg, Oral, BID, Nishant Dhungel, MD, 600 mg at 05/29/16 1005 . guaiFENesin-dextromethorphan (ROBITUSSIN DM) 100-10 MG/5ML syrup 5 mL, 5 mL, Oral, Q4H PRN, Clanford Marisa Hua, MD, 5 mL at 05/28/16 2300 . heparin injection 5,000 Units, 5,000 Units, Subcutaneous, Q8H, Nishant Dhungel, MD, 5,000 Units at 05/29/16 0624 . HYDROcodone-acetaminophen (NORCO/VICODIN) 5-325 MG per tablet 1-2 tablet, 1-2 tablet, Oral, Q6H PRN, Cecilie Kicks, PA-C, 2 tablet at 05/25/16 0128 . hydroxypropyl methylcellulose / hypromellose (ISOPTO TEARS / GONIOVISC) 2.5 % ophthalmic solution 2 drop, 2 drop, Both Eyes, QID PRN, Louellen Molder, MD, 2 drop at 05/25/16 2352 . menthol-cetylpyridinium (CEPACOL) lozenge 3 mg, 1 lozenge, Oral, PRN, 3 mg at 05/21/16 2148 **OR** phenol (CHLORASEPTIC) mouth spray 1 spray, 1 spray, Mouth/Throat, PRN, Cecilie Kicks, PA-C . methocarbamol (ROBAXIN) tablet 250 mg, 250 mg, Oral, Q6H PRN, Janece Canterbury, MD,  250 mg at 05/28/16 2141 . metoCLOPramide (REGLAN) tablet 5-10 mg, 5-10 mg, Oral, Q8H PRN **OR** metoCLOPramide (REGLAN) injection 5-10 mg, 5-10 mg, Intravenous, Q8H PRN, Cecilie Kicks, PA-C . multivitamin (RENA-VIT) tablet 1 tablet, 1 tablet, Oral, QHS, Mauricia Area, MD, 1 tablet at 05/28/16 2139 . ondansetron (ZOFRAN) tablet 4 mg, 4 mg, Oral, Q6H PRN **OR** ondansetron (ZOFRAN) injection 4 mg, 4 mg, Intravenous, Q6H PRN, Cecilie Kicks, PA-C, 4 mg at 05/24/16 8329 . ondansetron (ZOFRAN) injection 4 mg, 4 mg, Intravenous, Once PRN, Jillyn Hidden, MD . oxyCODONE-acetaminophen (PERCOCET/ROXICET) 5-325 MG per tablet 1-2 tablet, 1-2 tablet, Oral, Q6H PRN, Alvia Grove, PA-C . vitamin B-12 (CYANOCOBALAMIN) tablet 1,000 mcg, 1,000 mcg, Oral, Daily, Nishant Dhungel, MD, 1,000 mcg at 05/29/16 1005 . Vitamin D (Ergocalciferol) (DRISDOL) capsule 50,000 Units, 50,000 Units, Oral, Q7 days, Heath Lark, MD, 50,000 Units at 05/25/16 1137  Patients Current Diet: Diet renal with fluid restriction Fluid restriction:: 1200 mL Fluid; Room service appropriate?: Yes; Fluid consistency:: Thin  Precautions / Restrictions Precautions Precautions: Fall Restrictions Weight Bearing Restrictions: Yes LLE Weight Bearing: Weight bearing as tolerated   Has the patient had 2 or more falls or a fall with injury in the past year?No  Prior Activity Level Community (5-7x/wk): Was active, went out daily, was driving.  Home Assistive Devices / Equipment Home Assistive Devices/Equipment: Cane (specify quad or straight), Eyeglasses, Dentures (specify type)  Prior Device Use: Indicate devices/aids used by the patient prior to current illness, exacerbation or injury? Manual wheelchair, Archivist. He reports that he has been using a cane most recently.  Prior Functional Level Prior Function Level of Independence: Independent  Self Care: Did the patient need help bathing, dressing, using the toilet or  eating? Independent  Indoor Mobility: Did  the patient need assistance with walking from room to room (with or without device)? Independent  Stairs: Did the patient need assistance with internal or external stairs (with or without device)? Independent  Functional Cognition: Did the patient need help planning regular tasks such as shopping or remembering to take medications? Independent  Current Functional Level Cognition  Overall Cognitive Status: Within Functional Limits for tasks assessed Orientation Level: Oriented X4   Extremity Assessment (includes Sensation/Coordination)  Upper Extremity Assessment: Generalized weakness  Lower Extremity Assessment: Defer to PT evaluation RLE Deficits / Details: Grossly decr AROM and strength throughout, significantly limited by pain; quad activation and hamstring activation present, but weak RLE: Unable to fully assess due to pain    ADLs  Overall ADL's : Needs assistance/impaired Grooming: Wash/dry hands, Sitting, Set up Upper Body Bathing: Sitting, Minimal assitance Lower Body Bathing: Maximal assistance Upper Body Dressing : Minimal assistance, Sitting Upper Body Dressing Details (indicate cue type and reason): doffed front opening gown Lower Body Dressing: Total assistance Toilet Transfer: Stand-pivot, RW, Minimal assistance, Moderate assistance Toileting- Clothing Manipulation and Hygiene: Total assistance Functional mobility during ADLs: Minimal assistance, Moderate assistance General ADL Comments: Pt fatigued having been up in chair since this morning, limited session.    Mobility  Overal bed mobility: Needs Assistance Bed Mobility: Supine to Sit Rolling: Supervision Supine to sit: Mod assist, HOB elevated (help for LLE and pt then needed trunk assist due to LUE) Sit to sidelying: Min assist General bed mobility comments: nursing educated to use drop arm chair to get back to bed    Transfers  Overall transfer  level: Needs assistance Equipment used: 1 person hand held assist Transfers: Lateral/Scoot Transfers (drop arm chair ) Sit to Stand: Min assist Stand pivot transfers: Min assist Lateral/Scoot Transfers: Mod assist, From elevated surface (PT used bed pad to decrease effort due to new fistula LUE) General transfer comment: verbal cues for hand placement, assist to power up and stabilize initial standing balance    Ambulation / Gait / Stairs / Wheelchair Mobility  Ambulation/Gait Ambulation/Gait assistance: Min assist, +2 safety/equipment Ambulation Distance (Feet): 35 Feet Assistive device: Rolling walker (2 wheeled) Gait Pattern/deviations: Step-to pattern, Antalgic General Gait Details: withheld Gait velocity: decreased Gait velocity interpretation: Below normal speed for age/gender    Posture / Balance Dynamic Sitting Balance Sitting balance - Comments: Pt able to maintain EOB sitting balance. Much improved from initial eval last week Balance Overall balance assessment: Needs assistance Sitting-balance support: Feet supported Sitting balance-Leahy Scale: Good Sitting balance - Comments: Pt able to maintain EOB sitting balance. Much improved from initial eval last week Postural control: Right lateral lean Standing balance support: During functional activity, Bilateral upper extremity supported Standing balance-Leahy Scale: Fair Standing balance comment: Increased time required to stabilize initial standing balance.    Special needs/care consideration BiPAP/CPAP No CPM No Continuous Drip IV No  Dialysis Yes, patient has an OP spot TTS second shift [facility] for when he leaves the hospital Days T-Th-Sat Life Vest No Oxygen No Special Bed No Trach Size No Wound Vac (area) No  Skin Has left leg surgical incision with ace wrap. Bruises easily.  Bowel mgmt: Last BM 05/26/16 Bladder mgmt: Oliguria, using a urinal Diabetic mgmt  No    Previous Home Environment Living Arrangements: Spouse/significant other Lives With: Spouse Available Help at Discharge: Family, Available 24 hours/day Type of Home: House Home Layout: One level Home Access: Stairs to enter [employer] of Steps: 1 step down and then  1 step up into home Home Care Services: No Additional Comments: Pt, wife, and daughter expressed understanding that he will need rehabilitation once medically stable  Discharge Living Setting Plans for Discharge Living Setting: Patient's home, House, Lives with (comment) (Lives with wife.) Type of Home at Discharge: House Discharge Home Layout: One level Discharge Home Access: Stairs to enter Entrance Stairs-Number of Steps: 1 step down and then 1 step up Does the patient have any problems obtaining your medications?: No  Social/Family/Support Systems Patient Roles: Spouse, Parent (Has a wife and 2 daughters.) Contact Information: Marlin Jarrard - wife Anticipated Caregiver: wife Anticipated Caregiver's Contact Information: Lenell Antu - wife (h) [phone] (c) [phone] Ability/Limitations of Caregiver: Wife can assist, A daughter lives close by and is off work for the summer and can help. Another daughter works. Caregiver Availability: 24/7 Discharge Plan Discussed with Primary Caregiver: Yes Is Caregiver In Agreement with Plan?: Yes Does Caregiver/Family have Issues with Lodging/Transportation while Pt is in Rehab?: No  Goals/Additional Needs Patient/Family Goal for Rehab: PT/OT mod I and supervision goals Expected length of stay: 10-15 days Cultural Considerations: None Dietary Needs: Renal diet, 1200 mL/day, thin liquids Equipment Needs: TBD Special Service Needs: HD T-TH-Sat. Patient has an OP spot TTS second shift at [facility] for when he leaves the hospital per nephrology.  Pt/Family Agrees to Admission and willing to participate: Yes Program Orientation Provided & Reviewed with  Pt/Caregiver Including Roles & Responsibilities: Yes  Decrease burden of Care through IP rehab admission: N/A  Possible need for SNF placement upon discharge: Not planned  Patient Condition: This patient's medical and functional status has changed since the consult dated: 05/22/16 in which the Rehabilitation Physician determined and documented that the patient's condition is appropriate for intensive rehabilitative care in an inpatient rehabilitation facility. See "History of Present Illness" (above) for medical update. Functional changes are: Currently requiring mod assist for transfers. Patient's medical and functional status update has been discussed with the Rehabilitation physician and patient remains appropriate for inpatient rehabilitation. Will admit to inpatient rehab today.  Preadmission Screen Completed By: Retta Diones, 05/29/2016 11:22 AM ______________________________________________________________________  Discussed status with Dr. Posey Pronto on 05/29/16 at 1114 and received telephone approval for admission today.  Admission Coordinator: Retta Diones, time1115/Date06/19/17          Cosigned by: [name], MD at 05/29/2016 11:36 AM  Revision History     Date/Time User Provider Type Action   05/29/2016 11:36 AM [name], MD Physician Cosign   05/29/2016 11:22 AM Retta Diones, RN Rehab Admission Coordinator Sign

## 2016-05-29 NOTE — Care Management Important Message (Signed)
Important Message  Patient Details  Name: Thomas [name] MRN: [id number] Date of Birth: 04-Feb-1946   Medicare Important Message Given:  Yes    [name] 05/29/2016, 10:30 AM

## 2016-05-29 NOTE — Discharge Summary (Signed)
Physician Discharge Summary  Thomas [name] YJW:[id number] DOB: [birth year] DOA: 05/09/2016  PCP: No PCP Per Patient  Admit date: 05/09/2016 Discharge date: 05/29/2016  Admitted From: home  Disposition:  CIR  Recommendations for Outpatient Follow-up:  -Patient will be followed by renal and hematology while in the rehabilitation -These to be seen by orthopedics (Dr. Delfino Lovett) next week for follow-up of his left femur fracture. -resume heparin for DVT prophylaxis in the morning if platelets >50.    Discharge Condition: Stable CODE STATUS: Full code Diet recommendation: Renal with supplement (please provide some flexibility in diet to improve nutritional intake)   Discharge Diagnoses:  Principal Problem:   Multiple myeloma ([place])  Active Problems:   Multiple myeloma not having achieved remission (HCC)   Antineoplastic chemotherapy induced pancytopenia (HCC)   Renal tubular acidosis, type 4   Occult blood positive stool   Malnutrition of moderate degree   Acute renal failure (HCC)   Pathological fracture of left femur due to neoplastic disease (HCC)   Anemia due to bone marrow failure (HCC)   Vitamin B12 deficiency   Low back pain   Post-op pain   Surgery, elective   Acute blood loss anemia   ESRD on dialysis [facility])   Essential hypertension   Urinary tract infection, site not specified  Brief Narrative  70 year old male with no significant past medical history (had not seen a physician in many years) presented with one month of nausea with daily vomiting and poor by mouth intake with subjective weight loss. He went to an urgent care where he was found to have hemoglobin of 9.8 and sent to the ED with he was found to have creatinine of >10 and BUN of 40. Patient reported having low back pain about 3 months back and was taking several NSAIDs at home. In the ED was also severely hyperkalemic with anemia and thrombocytopenia. Admitted to hospitalist service. Further workup showed  diffuse skeletal hyperlucency with pathological left femur fracture with biopsy suggestive of multiple myeloma. Patient now on hemodialysis and started on chemotherapy.   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 Left UE AV fistula placed on 6/14. Has been on dialysis in 6/2. CLI P process started. Oncologist hopeful that patient's renal function should recover once his response to chemotherapy. Renal closely managing with HD. Has been CLIPed to outpt dialysis center.  Multiple myeloma with diffuse skeletal metastases and nephropathy Metastatic bone lesions with largest lesion involving left iliac.Marland Kitchen Also had pathological fracture of left midshaft femur that was repaired. bone biopsy suggest multiple myeloma. MRI of the lumbar spine shows large lumbar lesion associated with Extraosseous extension of tumor into the left paraspinous soft tissue at L5 with moderate to severe cannot stenosis. Given absence of neurological deficit neurosurgery recommends no surgical intervention. He was recommended for radiation therapy but since patient's back pain has been improving and been able to participate with PT this has been on hold. (Was seen by radiation oncologist one week back). -Patient started on Velcade. 6/8. (Velcade subcutaneous injections on days 1,4,8,11 and 14. Held dose today due to low platelets. Next dose on 6/21 if plts improve. - Weekly pulse dose dexamethasone starting 6/8. -Dr Alvy Bimler following closely.   cough Suspect atelectasis postop.  better with antitussives.   Acute pulmonary edema on chest x-ray 2-D echo Shows a normal EF with no wall motion abnormality..  Pathological fracture of left femur Status post IM nailing with open biopsy. Pain control  when necessary. Follow up with orthopedics nextweek ( pls call ortho) Seen by PT and recommends CIR. Added subcutaneous heparin at low-dose for DVT prophylaxis.( held dose  today since plt <50, can resume once >50)  Anemia secondary to myeloma, B12 deficiency Received multiple transfusions during hospital course. Currently stable.  Severe thrombocytopenia Initially due to multiple myeloma and now possibly due to chemotherapy. Stable at low levels. Received 2 units platelets postoperatively..  plateelts <50 today. Hold DVT prophylaxis, repeat in am.   Vitamin B12 deficiency Getting supplement  Malnutrition of moderate degree Added supplement. Recommend some liberty with diet. Ive provided them a list of foods ( mainly fruits and lean protein that he can have)      Family Communication : wife and daughter at bedside   Consults :  Nephrology Oncology ( Dr Alvy Bimler) Orthopedics Neurosurgery   Procedures :  HD catheter placement IM nailing of left femur Bone marrow biopsy LUE AV fistula   Discharge Instructions  Discharge Instructions    SCHEDULING COMMUNICATION    Complete by:  As directed   Chemotherapy Appointment  - 1 hour      SCHEDULING COMMUNICATION    Complete by:  As directed   Chemotherapy Appointment  - 1 hour      SCHEDULING COMMUNICATION    Complete by:  As directed   Chemotherapy Appointment  - 1 hour      [facility] NURSING COMMUNICATION    Complete by:  As directed   May order dexamethasone 40 mg po to be given in infusion if patient did not take dose at home.     TREATMENT CONDITIONS    Complete by:  As directed   Patient has severe anemia and is on dialysis. Proceed with abnormal labs     TREATMENT CONDITIONS    Complete by:  As directed   OK to proceed despite abnormal CBC and renal function            Medication List    TAKE these medications        acetaminophen 325 MG tablet  Commonly known as:  TYLENOL  Take 2 tablets (650 mg total) by mouth every 6 (six) hours as needed for mild pain (or Fever >/= 101).     acyclovir 200 MG capsule  Commonly known as:  ZOVIRAX  Take 1 capsule (200 mg total)  by mouth 2 (two) times daily.     ALPRAZolam 0.25 MG tablet  Commonly known as:  XANAX  Take 1 tablet (0.25 mg total) by mouth 2 (two) times daily as needed for anxiety.     alum & mag hydroxide-simeth 200-200-20 MG/5ML suspension  Commonly known as:  MAALOX/MYLANTA  Take 30 mLs by mouth every 4 (four) hours as needed for indigestion.     calcium acetate 667 MG capsule  Commonly known as:  PHOSLO  Take 1 capsule (667 mg total) by mouth 3 (three) times daily with meals.     cyanocobalamin 1000 MCG tablet  Take 1 tablet (1,000 mcg total) by mouth daily.     Darbepoetin Alfa 100 MCG/0.5ML Sosy injection  Commonly known as:  ARANESP  Inject 0.5 mLs (100 mcg total) into the vein every Saturday with hemodialysis.     dexamethasone 4 MG tablet  Commonly known as:  DECADRON  Take 10 tablets (40 mg total) by mouth once a week.  Start taking on:  06/01/2016     feeding supplement (PRO-STAT SUGAR FREE 64) Liqd  Take 30  mLs by mouth daily at 3 pm.     feeding supplement Liqd  Take 1 Container by mouth 2 (two) times daily between meals.     guaiFENesin 600 MG 12 hr tablet  Commonly known as:  MUCINEX  Take 1 tablet (600 mg total) by mouth 2 (two) times daily.     guaiFENesin-dextromethorphan 100-10 MG/5ML syrup  Commonly known as:  ROBITUSSIN DM  Take 5 mLs by mouth every 4 (four) hours as needed for cough.     heparin 5000 UNIT/ML injection  Inject 1 mL (5,000 Units total) into the skin every 8 (eight) hours.     HYDROcodone-acetaminophen 5-325 MG tablet  Commonly known as:  NORCO/VICODIN  Take 1-2 tablets by mouth every 6 (six) hours as needed for moderate pain.     hydroxypropyl methylcellulose / hypromellose 2.5 % ophthalmic solution  Commonly known as:  ISOPTO TEARS / GONIOVISC  Place 2 drops into both eyes 4 (four) times daily as needed for dry eyes.     methocarbamol 500 MG tablet  Commonly known as:  ROBAXIN  Take 0.5 tablets (250 mg total) by mouth every 6 (six) hours  as needed for muscle spasms.     multivitamin Tabs tablet  Take 1 tablet by mouth at bedtime.     Vitamin D (Ergocalciferol) 50000 units Caps capsule  Commonly known as:  DRISDOL  Take 1 capsule (50,000 Units total) by mouth every 7 (seven) days.           Follow-up Information    Follow up with Swinteck, Horald Pollen, MD. Schedule an appointment as soon as possible for a visit in 2 weeks.   Specialty:  Orthopedic Surgery   Why:  For wound re-check   Contact information:   [address] [place] [address] [phone]       Follow up with Adele Barthel, MD In 6 weeks.   Specialties:  Vascular Surgery, Cardiology   Why:  Our office will call you to arrange an appointment (sent)   Contact information:   [place] [address] [phone]       Follow up with [facility], NI, MD.   Specialty:  Hematology and Oncology   Why:  will be followed at [facility]   Contact information:   [place] [address] [phone]      Allergies  Allergen Reactions  . Penicillins Rash and Other (See Comments)    Has patient had a PCN reaction causing immediate rash, facial/tongue/throat swelling, SOB or lightheadedness with hypotension: YES + Reaction causing SEVERE RASH involving MUCUS MEMBRANES or SKIN NECROSIS >> YES Reaction that required hospitalization: NO Reaction occurring within the last 10 years: NO If all of the above answers are "NO", then may proceed with Cephalosporin use.   . Dilaudid [Hydromorphone Hcl] Other (See Comments)    hallucinations  . Codeine Other (See Comments)    "MAKES ME JUMPY"      Procedures/Studies: Dg Skull 1-3 Views  05/11/2016  CLINICAL DATA:  Myeloma EXAM: SKULL - 1-3 VIEW COMPARISON:  None. FINDINGS: Frontal view shows 13 mm lucency in the parasagittal left skull. On the lateral view approximately 10 round or oval lucencies are identified, the largest measuring 14 mm in maximal diameter. There are also  lucencies in both mandibles. IMPRESSION: Multiple radiolucencies consistent with myeloma. Electronically Signed   By: Skipper Cliche M.D.   On: 05/11/2016 10:42   Dg Chest 2 View  05/10/2016  CLINICAL DATA:  Congestive heart failure. EXAM: CHEST  2 VIEW COMPARISON:  None. FINDINGS: The heart size and mediastinal contours are within normal limits. No pneumothorax or pleural effusion is noted. Right lung is clear. Density is seen in left lower lobe which may represent either pleural-based mass or possibly expansile lesion within left posterior rib. The visualized skeletal structures are unremarkable. IMPRESSION: Left lower lobe density is noted representing pleural-based mass or expansile lesion within left posterior rib. CT scan of the chest is recommended to evaluate for possible neoplasm or malignancy. Electronically Signed   By: Marijo Conception, M.D.   On: 05/10/2016 13:08   Dg Lumbar Spine 2-3 Views  05/10/2016  CLINICAL DATA:  Lower lumbar pain radiating down the left leg. Injury in February 2017. EXAM: LUMBAR SPINE - 2-3 VIEW COMPARISON:  None. FINDINGS: Transitional S1 vertebra. Abnormal lucency in the left L5 vertebra appreciable on the frontal projection, potentially with some slight compression in this vicinity, but with lucency out of proportion to the compression. Irregular margins of the cortex of the S1 vertebra on the lateral projection. Anterior wedge compression fracture at L3 with 30% loss of vertebral body height. Aortoiliac atherosclerotic vascular disease. IMPRESSION: 1. Compression fractures at L3 and L5, with abnormal lucency in the left side of the L5 vertebral body raising concern for multiple myeloma or lytic metastatic disease from a radiographic standpoint. MRI could be utilized for further characterization if clinically warranted. 2. There is also cortical thickening and irregularity of the S1 vertebra, underlying malignancy not excluded at S 1. 3. 30% compression fracture at L 3,  age indeterminate. 4. Lumbar spondylosis. 5.  Aortoiliac atherosclerotic vascular disease. Electronically Signed   By: Van Clines M.D.   On: 05/10/2016 16:48   US Renal  05/10/2016  CLINICAL DATA:  Acute kidney injury. EXAM: RENAL / URINARY TRACT ULTRASOUND COMPLETE COMPARISON:  None. FINDINGS: Right Kidney: Length: 11.4 cm. Echogenicity within normal limits. No mass or hydronephrosis visualized. Left Kidney: Length: 11.6 cm. Echogenicity within normal limits. There is a 2.3 x 2.1 x 2.1 cm cyst in the upper pole. No fall with mass or hydronephrosis visualized. Bladder: Appears normal for degree of bladder distention. IMPRESSION: 1. Left renal cyst.  Otherwise unremarkable renal ultrasound. 2. No obstructive uropathy. Electronically Signed   By: Jeb Levering M.D.   On: 05/10/2016 04:29   Pelvis Portable  05/14/2016  CLINICAL DATA:  Left IM nail.  Postop pain. EXAM: PORTABLE PELVIS 1-2 VIEWS COMPARISON:  None. FINDINGS: Partially images the hardware within the proximal left femur. Hip joints and SI joints are symmetric and unremarkable. No acute fracture, subluxation or dislocation. IMPRESSION: No acute bony abnormality. Electronically Signed   By: Rolm Baptise M.D.   On: 05/14/2016 15:37   Ir Fluoro Guide Cv Line Right  05/12/2016  INDICATION: 70 year old with renal failure.  Catheter needed for hemodialysis. EXAM: FLUOROSCOPIC AND ULTRASOUND GUIDED PLACEMENT OF A TUNNELED DIALYSIS CATHETER Physician: Stephan Minister. Anselm Pancoast, MD MEDICATIONS: Vancomycin 1 g; The antibiotic was administered within an appropriate time interval prior to skin puncture. ANESTHESIA/SEDATION: Versed 1.5 mg IV; Fentanyl 50 mcg IV; Moderate Sedation Time:  34 The patient was continuously monitored during the procedure by the interventional radiology nurse under my direct supervision. FLUOROSCOPY TIME:  Fluoroscopy Time: 36 seconds, 4 mGy COMPLICATIONS: None immediate. PROCEDURE: Informed consent was obtained for placement of a tunneled  dialysis catheter. The patient was placed supine on the interventional table. Ultrasound confirmed a patent right internal jugular vein.  Ultrasound images were obtained for documentation. The right side of the neck was prepped and draped in a sterile fashion. The right side of the neck was anesthetized with 1% lidocaine. Maximal barrier sterile technique was utilized including caps, mask, sterile gowns, sterile gloves, sterile drape, hand hygiene and skin antiseptic. A small incision was made with #11 blade scalpel. A 21 gauge needle directed into the right internal jugular vein with ultrasound guidance. A micropuncture dilator set was placed. A 19 cm tip to cuff Palindrome catheter was selected. The skin below the right clavicle was anesthetized and a small incision was made with an #11 blade scalpel. A subcutaneous tunnel was formed to the vein dermatotomy site. The catheter was brought through the tunnel. The vein dermatotomy site was dilated to accommodate a peel-away sheath. The catheter was placed through the peel-away sheath and directed into the central venous structures. The tip of the catheter was placed at the superior cavoatrial junction with fluoroscopy. Fluoroscopic images were obtained for documentation. Both lumens were found to aspirate and flush well. The proper amount of heparin was flushed in both lumens. The vein dermatotomy site was closed using a single layer of absorbable suture and Dermabond. The catheter was secured to the skin using Prolene suture. Fluoroscopic and ultrasound images were taken and saved for documentation. IMPRESSION: Successful placement of a right jugular tunneled dialysis catheter using ultrasound and fluoroscopic guidance. Electronically Signed   By: Markus Daft M.D.   On: 05/12/2016 06:47   Ir US Guide Vasc Access Right  05/12/2016  INDICATION: 70 year old with renal failure.  Catheter needed for hemodialysis. EXAM: FLUOROSCOPIC AND ULTRASOUND GUIDED PLACEMENT OF A  TUNNELED DIALYSIS CATHETER Physician: Stephan Minister. Anselm Pancoast, MD MEDICATIONS: Vancomycin 1 g; The antibiotic was administered within an appropriate time interval prior to skin puncture. ANESTHESIA/SEDATION: Versed 1.5 mg IV; Fentanyl 50 mcg IV; Moderate Sedation Time:  34 The patient was continuously monitored during the procedure by the interventional radiology nurse under my direct supervision. FLUOROSCOPY TIME:  Fluoroscopy Time: 36 seconds, 4 mGy COMPLICATIONS: None immediate. PROCEDURE: Informed consent was obtained for placement of a tunneled dialysis catheter. The patient was placed supine on the interventional table. Ultrasound confirmed a patent right internal jugular vein. Ultrasound images were obtained for documentation. The right side of the neck was prepped and draped in a sterile fashion. The right side of the neck was anesthetized with 1% lidocaine. Maximal barrier sterile technique was utilized including caps, mask, sterile gowns, sterile gloves, sterile drape, hand hygiene and skin antiseptic. A small incision was made with #11 blade scalpel. A 21 gauge needle directed into the right internal jugular vein with ultrasound guidance. A micropuncture dilator set was placed. A 19 cm tip to cuff Palindrome catheter was selected. The skin below the right clavicle was anesthetized and a small incision was made with an #11 blade scalpel. A subcutaneous tunnel was formed to the vein dermatotomy site. The catheter was brought through the tunnel. The vein dermatotomy site was dilated to accommodate a peel-away sheath. The catheter was placed through the peel-away sheath and directed into the central venous structures. The tip of the catheter was placed at the superior cavoatrial junction with fluoroscopy. Fluoroscopic images were obtained for documentation. Both lumens were found to aspirate and flush well. The proper amount of heparin was flushed in both lumens. The vein dermatotomy site was closed using a single layer  of absorbable suture and Dermabond. The catheter was secured to the skin using Prolene suture. Fluoroscopic  and ultrasound images were taken and saved for documentation. IMPRESSION: Successful placement of a right jugular tunneled dialysis catheter using ultrasound and fluoroscopic guidance. Electronically Signed   By: Markus Daft M.D.   On: 05/12/2016 06:47   Ir US Guide Bx Asp/drain  05/12/2016  INDICATION: 70 year old with renal failure.  Request for renal biopsy. EXAM: ULTRASOUND-GUIDED RENAL BIOPSY MEDICATIONS: None. ANESTHESIA/SEDATION: Patient was monitored by radiology nurse throughout the procedure. Sedation was continued from the prior hemodialysis catheter placement. FLUOROSCOPY TIME:  None COMPLICATIONS: None immediate. PROCEDURE: Informed written consent was obtained from the patient after a thorough discussion of the procedural risks, benefits and alternatives. All questions were addressed. Maximal Sterile Barrier Technique was utilized including caps, mask, sterile gowns, sterile gloves, sterile drape, hand hygiene and skin antiseptic. A timeout was performed prior to the initiation of the procedure. Patient was placed prone. Both kidneys were evaluated with ultrasound. The right kidney was selected for biopsy. The right flank was prepped with chlorhexidine and a sterile field was created. Skin was anesthetized with 1% lidocaine. Using ultrasound guidance, 16 gauge core biopsy was obtained from the right kidney lower pole. Specimen placed in saline. A second ultrasound-guided core biopsy was obtained from the lower pole. Specimen was placed in saline. These samples were felt to be adequate. Bandage placed over the puncture site. FINDINGS: Two core biopsies obtained from the right kidney lower pole. Small perinephric hematoma identified following the procedure. This hematoma did not appear to be expanding based on post biopsy visualization. IMPRESSION: Ultrasound-guided core biopsies of the right  kidney lower pole. Electronically Signed   By: Markus Daft M.D.   On: 05/12/2016 06:54   Ct Biopsy  05/16/2016  INDICATION: 70 year old male with a history of multiple myeloma. EXAM: CT BIOPSY MEDICATIONS: None. ANESTHESIA/SEDATION: Moderate (conscious) sedation was employed during this procedure. A total of Versed 1.0 mg and Fentanyl 50 mcg was administered intravenously. Moderate Sedation Time: 15 minutes. The patient's level of consciousness and vital signs were monitored continuously by radiology nursing throughout the procedure under my direct supervision. FLUOROSCOPY TIME:  CT COMPLICATIONS: None PROCEDURE: The procedure risks, benefits, and alternatives were explained to the patient. Questions regarding the procedure were encouraged and answered. The patient understands and consents to the procedure. Scout CT of the pelvis was performed for surgical planning purposes. The posterior pelvis was prepped with Betadinein a sterile fashion, and a sterile drape was applied covering the operative field. A sterile gown and sterile gloves were used for the procedure. Local anesthesia was provided with 1% Lidocaine. We targeted the left posterior iliac bone for biopsy. The skin and subcutaneous tissues were infiltrated with 1% lidocaine without epinephrine. A small stab incision was made with an 11 blade scalpel, and an 11 gauge Murphy needle was advanced with CT guidance to the posterior cortex. Manual forced was used to advance the needle through the posterior cortex and the stylet was removed. A bone marrow aspirate was retrieved and passed to a cytotechnologist in the room. The Murphy needle was then advanced without the stylet for a core biopsy. The core biopsy was retrieved and also passed to a cytotechnologist. Manual pressure was used for hemostasis and a sterile dressing was placed. No complications were encountered no significant blood loss was encountered. Patient tolerated the procedure well and remained  hemodynamically stable throughout. IMPRESSION: Status post CT-guided bone marrow biopsy, with tissue specimen sent to pathology for complete histopathologic analysis Signed, Dulcy Fanny. Earleen Newport, DO [facility]  Radiology Electronically Signed   By: Corrie Mckusick D.O.   On: 05/16/2016 10:09   Dg Chest Port 1 View  05/24/2016  CLINICAL DATA:  Cough and congestion. EXAM: PORTABLE CHEST 1 VIEW COMPARISON:  05/17/2016. FINDINGS: Right IJ sheath in stable position. Mediastinum and hilar structures normal. Cardiomegaly with mild pulmonary venous congestion . Progressive left lower lobe infiltrate consistent with pneumonia and/or asymmetric pulmonary edema. Mild increased interstitial markings noted bilaterally. Persistent left pleural effusion. No pneumothorax. IMPRESSION: 1.  Right IJ sheath in stable position. 2. Cardiomegaly with pulmonary venous congestion and mild increase in interstitial markings noted diffusely consistent with congestive heart failure. Progressive left lower lobe infiltrate consistent with pneumonia and/or asymmetric pulmonary edema. Small left pleural effusion. Electronically Signed   By: Marcello Moores  Register   On: 05/24/2016 07:24   Dg Chest Port 1 View  05/17/2016  CLINICAL DATA:  Postop fever, left femur surgery EXAM: PORTABLE CHEST 1 VIEW COMPARISON:  05/10/2016 FINDINGS: Cardiomediastinal silhouette is stable. There is dual lumen right IJ catheter with tip in SVC right atrium junction. No pneumothorax. There is small left pleural effusion left basilar atelectasis or infiltrate. No pulmonary edema. IMPRESSION: No pulmonary edema. Small left pleural effusion with left basilar atelectasis or infiltrate. Electronically Signed   By: Lahoma Crocker M.D.   On: 05/17/2016 08:27   Dg Bone Survey Met  05/12/2016  CLINICAL DATA:  Back pain and weakness. Evaluate for multiple myeloma and bone cancer. EXAM: METASTATIC BONE SURVEY COMPARISON:  05/10/2016 FINDINGS:  Patient is edentulous. Subtle lucent areas in the calvarium are suspicious. Right jugular dialysis catheter tip in the lower SVC. No suspicious lesions in the upper extremities. Multilevel degenerativee facet disease in the cervical spine. Normal alignment of cervical spine. Prevertebral soft tissues are normal. Normal alignment of the thoracic spine. There appears to be 6 non rib-bearing vertebral bodies. Assuming that S1 is a transitional vertebral body, there is a suspicious lucent lesion along the left side of the L5 vertebral body and pedicle. Again noted is mild vertebral body height loss at L5. There is a stable superior compression deformity of the L3 vertebral body. Concern expansion of the left L1 pedicle. Cannot exclude a lesion at this location. Again noted is an expansile lesion involving the left eighth rib. This is better visualized on prior chest radiograph. Pelvic radiograph demonstrate a large lucent lesion involving the left iliac wing. This lesion measures up to 11 cm. Heterogeneous area of lucency in the midshaft of the left femur is concerning for a lesion. There is cortical thinning in this area. No suspicious lesions in the right lower extremity. IMPRESSION: Multiple bone lesions are suggestive for metastatic bone disease and suspicious for multiple myeloma. Largest lesion is involving the left iliac wing and this would be the most amenable to biopsy. There is also an expansile lesion involving the left eighth rib and a lucent lesion involving the L5 left pedicle. Question a lesion of the left L1 pedicle. Probable small calvarial lesions. Suspicious lucency involving the midshaft of the left femur with cortical thinning. This is concerning for a metastatic bone lesion and would be at risk for a pathologic fracture. L3 compression fracture.  Questionable compression fracture at L5. These results were called by telephone at the time of interpretation on 05/12/2016 at 4:47 pm to Dr. Clementeen Graham, who  verbally acknowledged these results. Electronically Signed   By: Markus Daft M.D.   On: 05/12/2016 16:47   Dg C-arm 61-120 Min  05/14/2016  CLINICAL DATA:  Elective surgery.  Left IM nail. EXAM: DG C-ARM 61-120 MIN; LEFT FEMUR 2 VIEWS COMPARISON:  05/13/2016 FINDINGS: Placement of IM nail within the left femur, bridging the permeative lesions seen on prior plain films. Hip screw in place as well. No hardware or bony complicating feature. IMPRESSION: Placement of hip screw and left femoral IM nail as above. No visible complicating feature. Electronically Signed   By: Rolm Baptise M.D.   On: 05/14/2016 13:45   Mr Total Spine Mets Screening  05/16/2016  CLINICAL DATA:  Initial evaluation for metastatic disease, presumed multiple myeloma. EXAM: MRI TOTAL SPINE WITHOUT CONTRAST TECHNIQUE: Multisequence MR imaging of the spine from the cervical spine to the sacrum was performed prior to and following IV contrast administration for evaluation of spinal metastatic disease. CONTRAST:  None. COMPARISON:  None. FINDINGS: Cervical Findings: Limited sagittal views of the cervical spine demonstrate a diffusely abnormal marrow pattern with multiple osseous lesions present, concerning for multiple myeloma. The most prevalent of these is present in the dens which demonstrates fairly diffuse abnormal STIR signal intensity (series 6, image 6). No significant extra osseous extension of tumor. No pathologic fracture. Scattered mild degenerative disc bulging throughout the cervical spine with posterior element hypertrophy. Associated diffuse canal narrowing without severe canal stenosis. No paraspinous soft tissue mass appreciated. Thoracic Findings: Limited sagittal views of the thoracic spine demonstrate diffusely abnormal marrow pattern with multiple osseous lesions present, again highly concerning for multiple myeloma. Most prominent lesion present within the T3 vertebral body and measures 19 mm. No significant extra osseous  extension of tumor identified on this limited exam. No associated pathologic fracture. No paraspinous soft tissue mass appreciated. Lumbar Findings: There are 6 non rib-bearing lumbar type vertebral bodies. The lowest vertebral body is labeled L6. Vertebral body count is made from the odontoid week. Limited sagittal views of the lumbar spine demonstrate diffusely abnormal marrow pattern with innumerable osseous lesions present, highly concerning for multiple myeloma. These are present throughout the lumbar spine and sacrum. There is associated probable pathologic fracture involving the L3 vertebral body with approximately 50% of central height loss. No significant bony retropulsion. Mild compression deformity at the superior endplate of L2, likely related to chronic Schmorl's node. Large metastatic/myelomatous lesion involves the L5 vertebral body with extension into the left posterior elements. This lesion completely involves the central and left aspect of the L5 vertebral body with destruction of the left posterior elements and extension into the paraspinous soft tissues. Lesion measures approximately 6.0 x 9.0 x 7.5 cm (series 12, image 10). Abnormal posterior convex bowing of the L5 vertebral body with moderate to severe canal stenosis (series 10, image 7). There is extra osseous extension of tumor into the adjacent left L4-5 and L5-6 neural foramina with secondary mild narrowing at L4-5 and more moderate to severe narrowing at L5-6 (series 9, image 12). A second large ovoid lesion involving the left iliac wing is partially visualized (series 12, image 13). No other definite paraspinous soft tissues mass. T2 hyperintense cyst noted within the left kidney. IMPRESSION: 1. 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 Careful correlation to the vertebral body count on this exam is recommended prior to any potential future intervention. 2. Diffusely abnormal marrow signal  intensity with innumerable osseous lesions present throughout the spine, concerning for multiple myeloma given the provided history. 3. Large 6.0 x 9.0 x 7.5 cm lesion with associated extraosseous extension of tumor into the left paraspinous soft tissues at L5.  Abnormal posterior convex bowing of the L5 vertebral body with moderate to severe canal stenosis. Neural foraminal encroachment on the left at L4-5 and L5-6 as well. No other significant canal or neural foraminal encroachment within the lumbar spine. 4. No significant extraosseous tumor or or spinal canal encroachment within the cervical and thoracic spine. 5. Compression deformity of L3 with up to 50% height loss, which may be pathologic in nature. No bony retropulsion. 6. Additional large lesion with extraosseous extension of tumor involving the left iliac wing, incompletely visualized on this exam. Electronically Signed   By: Jeannine Boga M.D.   On: 05/16/2016 00:51   Dg Femur Min 2 Views Left  05/14/2016  CLINICAL DATA:  Elective surgery.  Left IM nail. EXAM: DG C-ARM 61-120 MIN; LEFT FEMUR 2 VIEWS COMPARISON:  05/13/2016 FINDINGS: Placement of IM nail within the left femur, bridging the permeative lesions seen on prior plain films. Hip screw in place as well. No hardware or bony complicating feature. IMPRESSION: Placement of hip screw and left femoral IM nail as above. No visible complicating feature. Electronically Signed   By: Rolm Baptise M.D.   On: 05/14/2016 13:45   Dg Femur Min 2 Views Left  05/13/2016  CLINICAL DATA:  Left femur pain anteriorly from the left hip to the knee. EXAM: LEFT FEMUR 2 VIEWS COMPARISON:  Bone survey 05/12/2016 FINDINGS: There is permeative lucency within the midshaft of the femur with ill-defined margins and cortical involvement as described on yesterday's study. This lesion involves a length of at least 6 cm. No displaced fracture is identified. The left hip and left knee are located. No soft tissue  abnormality is seen. IMPRESSION: Permeative left midshaft femur lesion concerning for malignancy as described on yesterday's study. No fracture identified. Electronically Signed   By: Logan Bores M.D.   On: 05/13/2016 11:40   Dg Femur Port Min 2 Views Left  05/14/2016  CLINICAL DATA:  IM nail placement.  Postop pain. EXAM: LEFT FEMUR PORTABLE 2 VIEWS COMPARISON:  05/13/2016 FINDINGS: Placement of intra medullary nail and femoral neck screw. The IM nail crosses the herniated area within the mid shaft. There is a cortical defect noted laterally, new since prior study, possibly biopsy site. No additional interval change since prior study. IMPRESSION: Intra medullary nail placed across the permeative lesion in the mid left femur. Cortical defect laterally presumably represents biopsy site. Electronically Signed   By: Rolm Baptise M.D.   On: 05/14/2016 15:38       Subjective:   Discharge Exam: Filed Vitals:   05/29/16 0540 05/29/16 0746  BP: 142/59 138/63  Pulse: 73 69  Temp: 98.1 F (36.7 C) 98.6 F (37 C)  Resp: 20 18   Filed Vitals:   05/28/16 1837 05/28/16 2103 05/29/16 0540 05/29/16 0746  BP: 125/70 139/66 142/59 138/63  Pulse: 76 65 73 69  Temp: 98.2 F (36.8 C) 98.1 F (36.7 C) 98.1 F (36.7 C) 98.6 F (37 C)  TempSrc: Oral Oral Oral Oral  Resp: 18 19 20 18   Height:      Weight:      SpO2: 97% 100% 100% 100%    Gen: not in distress,  HEENT: moist mucosa, supple neck Chest: clear b/l, dialysis catheter CVS: N S1&S2, no murmurs GI: soft, NT, ND, Musculoskeletal: warm, no edema, left AV fistula site appears clean, Dressing over left thigh, Small area of erythema over LLQ , no warmth or tenderness CNS: AAOX3    The results of  significant diagnostics from this hospitalization (including imaging, microbiology, ancillary and laboratory) are listed below for reference.     Microbiology: Recent Results (from the past 240 hour(s))  Culture, Urine     Status: Abnormal    Collection Time: 05/21/16  3:31 PM  Result Value Ref Range Status   Specimen Description URINE, RANDOM  Final   Special Requests NONE  Final   Culture MULTIPLE SPECIES PRESENT, SUGGEST RECOLLECTION (A)  Final   Report Status 05/23/2016 FINAL  Final     Labs: BNP (last 3 results) No results for input(s): BNP in the last 8760 hours. Basic Metabolic Panel:  Recent Labs Lab 05/23/16 0706  05/24/16 0648 05/25/16 0711 05/26/16 0733 05/27/16 0540 05/29/16 0516  NA 129*  < > 135 131* 131* 130* 131*  K 3.6  < > 4.1 4.3 4.1 4.6 3.8  CL 94*  < > 103 97* 95* 94* 96*  CO2 25  < > 26 28 27 27 28   GLUCOSE 110*  < > 104* 108* 156* 117* 119*  BUN 42*  < > 24* 35* 22* 38* 35*  CREATININE 6.38*  < > 4.43* 6.03* 3.98* 5.02* 4.64*  CALCIUM 8.3*  < > 7.5* 7.9* 8.1* 8.1* 7.9*  PHOS 4.6  --   --  4.6 4.6 5.0*  --   < > = values in this interval not displayed. Liver Function Tests:  Recent Labs Lab 05/24/16 0648 05/25/16 0711 05/26/16 0733 05/27/16 0540 05/29/16 0516  AST 19  --   --   --  37  ALT 16*  --   --   --  31  ALKPHOS 72  --   --   --  90  BILITOT 1.8*  --   --   --  1.0  PROT 7.2  --   --   --  6.7  ALBUMIN 1.9* 1.8* 2.0* 2.0* 2.0*   No results for input(s): LIPASE, AMYLASE in the last 168 hours. No results for input(s): AMMONIA in the last 168 hours. CBC:  Recent Labs Lab 05/23/16 0707 05/24/16 0648 05/25/16 0710 05/29/16 0516  WBC 7.5 4.8 3.6* 2.5*  NEUTROABS  --  3.6  --  1.3*  HGB 8.4* 8.1* 7.8* 8.6*  HCT 25.6* 25.5* 24.9* 27.0*  MCV 86.8 89.8 89.9 89.7  PLT 78* 70* 62* 47*   Cardiac Enzymes: No results for input(s): CKTOTAL, CKMB, CKMBINDEX, TROPONINI in the last 168 hours. BNP: Invalid input(s): POCBNP CBG: No results for input(s): GLUCAP in the last 168 hours. D-Dimer No results for input(s): DDIMER in the last 72 hours. Hgb A1c No results for input(s): HGBA1C in the last 72 hours. Lipid Profile No results for input(s): CHOL, HDL, LDLCALC, TRIG,  CHOLHDL, LDLDIRECT in the last 72 hours. Thyroid function studies No results for input(s): TSH, T4TOTAL, T3FREE, THYROIDAB in the last 72 hours.  Invalid input(s): FREET3 Anemia work up No results for input(s): VITAMINB12, FOLATE, FERRITIN, TIBC, IRON, RETICCTPCT in the last 72 hours. Urinalysis    Component Value Date/Time   COLORURINE YELLOW 05/21/2016 1531   APPEARANCEUR CLOUDY* 05/21/2016 1531   LABSPEC 1.008 05/21/2016 1531   PHURINE 7.5 05/21/2016 1531   GLUCOSEU NEGATIVE 05/21/2016 1531   HGBUR LARGE* 05/21/2016 1531   BILIRUBINUR NEGATIVE 05/21/2016 1531   KETONESUR NEGATIVE 05/21/2016 1531   PROTEINUR 100* 05/21/2016 1531   NITRITE NEGATIVE 05/21/2016 1531   LEUKOCYTESUR NEGATIVE 05/21/2016 1531   Sepsis Labs Invalid input(s): PROCALCITONIN,  WBC,  LACTICIDVEN Microbiology Recent  Results (from the past 240 hour(s))  Culture, Urine     Status: Abnormal   Collection Time: 05/21/16  3:31 PM  Result Value Ref Range Status   Specimen Description URINE, RANDOM  Final   Special Requests NONE  Final   Culture MULTIPLE SPECIES PRESENT, SUGGEST RECOLLECTION (A)  Final   Report Status 05/23/2016 FINAL  Final     Time coordinating discharge: Over 30 minutes  SIGNED:   Louellen Molder, MD  [facility] 05/29/2016, 12:06 PM Pager   If 7PM-7AM, please contact night-coverage [email or web address] Password [email or web address]

## 2016-05-29 NOTE — Progress Notes (Signed)
Thomas Lorie Phenix, MD Physician Signed Physical Medicine and Rehabilitation Consult Note 05/22/2016 9:28 AM  Related encounter: ED to Hosp-Admission (Current) from 05/09/2016 in [place] Collapse All        Physical Medicine and Rehabilitation Consult Reason for Consult: left femur pathologic fracture/multiple myeloma Referring Physician: [facility]   HPI: Thomas [name] is a 70 y.o. right handed male with chronic back pain admitted 05/10/2016 with nausea, back pain and has been taking very high doses of NSAIDS recently. Per chart review patient lives with his wife was independent until March with progressive back pain. One level home with a ramp. Wife can assist. Recently saw a chiropractor for his back pain and was receiving narcotics. Findings of elevated creatinine 10.50, hypokalemia 6.7. Renal ultrasound negative for uropathy. Renal services was consulted with hemodialysis initiated. Vascular surgery consulted for access. Latest creatinine 3.74. Workup for back pain with bone scan showing multiple bone lesions suggestive of metastatic bone disease suspicious for multiple myeloma. Largest lesion left iliac wing. There was also a lesion involving left eighth rib lucent lesion involving L5 left pedicle and questionable lesion L1 pedicle. Suspicious lucency midshaft left femur with cortical thinning as well as L3-L5 compression fracture. Bone marrow biopsy completed 05/16/2016. Oncology as well as radiation oncology consulted and presently on Decadron therapy. He underwent intramedullary fixation of left femur per Dr. Lyla Glassing 05/15/2016 for pathologic fracture left femur. Patient is weightbearing as tolerated left lower extremity. Hospital course pain management. Acute on chronic anemia hemoglobin 8.3.   Review of Systems  Constitutional: Negative for fever and chills.  HENT: Negative for hearing loss.  Eyes: Negative for blurred vision and double  vision.  Respiratory: Positive for cough. Negative for shortness of breath.  Cardiovascular: Negative for chest pain, palpitations and leg swelling.  Gastrointestinal: Positive for nausea and constipation.  Genitourinary: Positive for urgency. Negative for dysuria and hematuria.  Musculoskeletal: Positive for myalgias and back pain.  Skin: Negative for rash.  Neurological: Positive for weakness. Negative for seizures and headaches.  All other systems reviewed and are negative.  Past Medical History  Diagnosis Date  . Compression fracture    Past Surgical History  Procedure Laterality Date  . Mouth surgery    . Femur im nail Left 05/14/2016    Procedure: INTRAMEDULLARY (IM) NAIL FEMORAL; Surgeon: Rod Can, MD; Location: [facility]; Service: Orthopedics; Laterality: Left;  . Bone biopsy Left 05/14/2016    Procedure: LEFT FEMORAL BIOPSY WITH INTRAOPERATIVE FROZEN SECTIONS ; Surgeon: Rod Can, MD; Location: [facility]; Service: Orthopedics; Laterality: Left;   Family History  Problem Relation Age of Onset  . Diabetes Father    Social History:  reports that he has quit smoking. He does not have any smokeless tobacco history on file. He reports that he does not drink alcohol or use illicit drugs. Allergies:  Allergies  Allergen Reactions  . Dilaudid [Hydromorphone Hcl] Other (See Comments)    hallucinations  . Codeine Other (See Comments)    "MAKES ME JUMPY"  . Penicillins     Has patient had a PCN reaction causing immediate rash, facial/tongue/throat swelling, SOB or lightheadedness with hypotension: Yes Has patient had a PCN reaction causing severe rash involving mucus membranes or skin necrosis: Yes Has patient had a PCN reaction that required hospitalization: No Has patient had a PCN reaction occurring within the last 10 years: No If all of the above answers are "NO", then may proceed with Cephalosporin use.  No prescriptions prior to admission    Home: Home Living Family/patient expects to be discharged to:: Other (Comment) (inpatient rehab vs SNF) Additional Comments: Pt, wife, and daughter expressed understanding that he will need rehabilitation once medically stable  Functional History: Prior Function Level of Independence: Independent Functional Status:  Mobility: Bed Mobility Overal bed mobility: Needs Assistance Bed Mobility: Supine to Sit Supine to sit: Mod assist General bed mobility comments: assist with LLE and to raise trunk Transfers Overall transfer level: Needs assistance Equipment used: Rolling walker (2 wheeled) Transfers: Sit to/from Stand, Stand Pivot Transfers Sit to Stand: Min assist Stand pivot transfers: Min assist General transfer comment: verbal cues for hand placement, assist to power up and stabilize initial standing balance Ambulation/Gait Ambulation/Gait assistance: Min assist Ambulation Distance (Feet): 5 Feet Assistive device: Rolling walker (2 wheeled) Gait Pattern/deviations: Step-to pattern, Antalgic, Decreased stride length General Gait Details: verbal cues fo sequencing Gait velocity: decreased Gait velocity interpretation: Below normal speed for age/gender    ADL: ADL Overall ADL's : Needs assistance/impaired Grooming: Wash/dry hands, Wash/dry face, Sitting, Set up Upper Body Bathing: Sitting, Minimal assitance Lower Body Bathing: Maximal assistance Upper Body Dressing : Minimal assistance, Sitting Lower Body Dressing: Total assistance Toilet Transfer: Stand-pivot, RW, Minimal assistance, Moderate assistance Toileting- Clothing Manipulation and Hygiene: Total assistance Functional mobility during ADLs: Minimal assistance, Moderate assistance  Cognition: Cognition Overall Cognitive Status: Within Functional Limits for tasks assessed Orientation Level: Oriented X4 Cognition Arousal/Alertness: Awake/alert Behavior During Therapy:  WFL for tasks assessed/performed Overall Cognitive Status: Within Functional Limits for tasks assessed  Blood pressure 142/64, pulse 77, temperature 98.1 F (36.7 C), temperature source Oral, resp. rate 18, height 6' 1"  (1.854 m), weight 80.4 kg (177 lb 4 oz), SpO2 96 %. Physical Exam  Vitals reviewed. Constitutional: He is oriented to person, place, and time. He appears well-developed. No distress.  HENT:  Head: Normocephalic.  Right Ear: External ear normal.  Left Ear: External ear normal.  Eyes: Conjunctivae and EOM are normal.  Neck: Normal range of motion. Neck supple. No thyromegaly present.  Cardiovascular: Normal rate and regular rhythm.  Respiratory: Effort normal and breath sounds normal. No respiratory distress.  GI: Soft. Bowel sounds are normal. He exhibits no distension.  Musculoskeletal: He exhibits edema and tenderness.  Neurological: He is alert and oriented to person, place, and time.  Motor: Bilateral upper extremities: 4/5 proximal to distal Right lower extremity 4 -/5 proximal to distal Left lower extremity: Hip flexion, knee extension 2/5, ankle dorsi/plantar flexion 2+/5 Sensation intact light touch  Skin: Skin is warm and dry.  Dressings C/D/I  Psychiatric: He has a normal mood and affect. His behavior is normal.     Lab Results Last 24 Hours    Results for orders placed or performed during the hospital encounter of 05/09/16 (from the past 24 hour(s))  Urinalysis, Routine w reflex microscopic (not at [facility]) Status: Abnormal   Collection Time: 05/21/16 3:31 PM  Result Value Ref Range   Color, Urine YELLOW YELLOW   APPearance CLOUDY (A) CLEAR   Specific Gravity, Urine 1.008 1.005 - 1.030   pH 7.5 5.0 - 8.0   Glucose, UA NEGATIVE NEGATIVE mg/dL   Hgb urine dipstick LARGE (A) NEGATIVE   Bilirubin Urine NEGATIVE NEGATIVE   Ketones, ur NEGATIVE NEGATIVE mg/dL   Protein, ur 100 (A) NEGATIVE mg/dL   Nitrite  NEGATIVE NEGATIVE   Leukocytes, UA NEGATIVE NEGATIVE  Urine microscopic-add on Status: Abnormal   Collection Time: 05/21/16 3:31 PM  Result Value Ref Range   Squamous Epithelial / LPF 0-5 (A) NONE SEEN   WBC, UA 0-5 0 - 5 WBC/hpf   RBC / HPF 6-30 0 - 5 RBC/hpf   Bacteria, UA MANY (A) NONE SEEN  Comprehensive metabolic panel Status: Abnormal   Collection Time: 05/22/16 4:46 AM  Result Value Ref Range   Sodium 130 (L) 135 - 145 mmol/L   Potassium 3.5 3.5 - 5.1 mmol/L   Chloride 96 (L) 101 - 111 mmol/L   CO2 26 22 - 32 mmol/L   Glucose, Bld 111 (H) 65 - 99 mg/dL   BUN 32 (H) 6 - 20 mg/dL   Creatinine, Ser 5.05 (H) 0.61 - 1.24 mg/dL   Calcium 7.9 (L) 8.9 - 10.3 mg/dL   Total Protein 7.0 6.5 - 8.1 g/dL   Albumin 1.9 (L) 3.5 - 5.0 g/dL   AST 22 15 - 41 U/L   ALT 20 17 - 63 U/L   Alkaline Phosphatase 72 38 - 126 U/L   Total Bilirubin 1.2 0.3 - 1.2 mg/dL   GFR calc non Af Amer 11 (L) >60 mL/min   GFR calc Af Amer 12 (L) >60 mL/min   Anion gap 8 5 - 15  Uric acid Status: None   Collection Time: 05/22/16 4:46 AM  Result Value Ref Range   Uric Acid, Serum 5.3 4.4 - 7.6 mg/dL      Imaging Results (Last 48 hours)    No results found.    Assessment/Plan: Diagnosis: pathologic fracture left femur Labs and images independently reviewed. Records reviewed and summated above.  1. Does the need for close, 24 hr/day medical supervision in concert with the patient's rehab needs make it unreasonable for this patient to be served in a less intensive setting? Yes 2. Co-Morbidities requiring supervision/potential complications: Acute on chronic anemia (transfuse if necessary to ensure appropriate perfusion for increased activity tolerance), chronic back and post-op pain management (Biofeedback training with therapies to help reduce reliance on opiate pain medications,  monitor pain control during therapies, and sedation at rest and titrate to maximum efficacy to ensure participation and gains in therapies), multiple myeloma (cont recs per Onc), ESRD (newly diagnosed, continue recs per nephro), HTN (monitor and provide prns in accordance with increased physical exertion and pain), ?UTI (cont to follow culture results) 3. Due to safety, skin/wound care, disease management, medication administration, pain management and patient education, does the patient require 24 hr/day rehab nursing? Yes 4. Does the patient require coordinated care of a physician, rehab nurse, PT (1-2 hrs/day, 5 days/week) and OT (1-2 hrs/day, 5 days/week) to address physical and functional deficits in the context of the above medical diagnosis(es)? Yes Addressing deficits in the following areas: balance, endurance, locomotion, strength, transferring, bathing, dressing, toileting and psychosocial support 5. Can the patient actively participate in an intensive therapy program of at least 3 hrs of therapy per day at least 5 days per week? Yes 6. The potential for patient to make measurable gains while on inpatient rehab is excellent 7. Anticipated functional outcomes upon discharge from inpatient rehab are modified independent and supervision with PT, modified independent with OT, n/a with SLP. 8. Estimated rehab length of stay to reach the above functional goals is: 10-15 days. 9. Does the patient have adequate social supports and living environment to accommodate these discharge functional goals? Yes 10. Anticipated D/C setting: Home 11. Anticipated post D/C treatments: HH therapy and Home excercise program 12. Overall Rehab/Functional Prognosis: good and fair  RECOMMENDATIONS: This patient's condition is appropriate for continued rehabilitative care in the following setting: CIR Patient has agreed to participate in recommended program. Yes Note that insurance prior authorization may be required  for reimbursement for recommended care.  Comment: Rehab Admissions Coordinator to follow up.  Delice Lesch, MD 05/22/2016       Revision History     Date/Time User Provider Type Action   05/22/2016 11:45 AM Thomas Lorie Phenix, MD Physician Sign   05/22/2016 10:10 AM Cathlyn Parsons, PA-C Physician Assistant Pend   View Details Report       Routing History     Date/Time From To Method   05/22/2016 11:45 AM Thomas Lorie Phenix, MD No Pcp Per Patient In Basket

## 2016-05-29 NOTE — Progress Notes (Signed)
Received pt. As a transfer from [facility] east.pt. And family were oriented to rehab routine and protocol.Safety plan was explained,fall prevention plan was explained and sign by the pt. And RN.Safety video was played.

## 2016-05-29 NOTE — Discharge Instructions (Addendum)
Dr. Rod Can Adult Hip & Knee Specialist [facility] [address] [place], [place] [address] [phone]   POSTOPERATIVE DIRECTIONS    Hip Rehabilitation, Guidelines Following Surgery   WEIGHT BEARING Weight bearing as tolerated with assist device (walker, cane, etc) as directed, use it as long as suggested by your surgeon or therapist, typically at least 4-6 weeks.   HOME CARE INSTRUCTIONS  Remove items at home which could result in a fall. This includes throw rugs or furniture in walking pathways.  Continue medications as instructed at time of discharge.  You may have some home medications which will be placed on hold until you complete the course of blood thinner medication.  4 days after discharge, you may start showering. No tub baths or soaking your incisions. Do not put on socks or shoes without following the instructions of your caregivers.   Sit on chairs with arms. Use the chair arms to help push yourself up when arising.  Arrange for the use of a toilet seat elevator so you are not sitting low.   Walk with walker as instructed.  You may resume a sexual relationship in one month or when given the OK by your caregiver.  Use walker as long as suggested by your caregivers.  Avoid periods of inactivity such as sitting longer than an hour when not asleep. This helps prevent blood clots.  You may return to work once you are cleared by Engineer, production.  Do not drive a car for 6 weeks or until released by your surgeon.  Do not drive while taking narcotics.  Wear elastic stockings for two weeks following surgery during the day but you may remove then at night.  Make sure you keep all of your appointments after your operation with all of your doctors and caregivers. You should call the office at the above phone number and make an appointment for approximately two weeks after the date of your surgery. Please pick up a stool softener and laxative for  home use as long as you are requiring pain medications.  ICE to the affected hip every three hours for 30 minutes at a time and then as needed for pain and swelling. Continue to use ice on the hip for pain and swelling from surgery. You may notice swelling that will progress down to the foot and ankle.  This is normal after surgery.  Elevate the leg when you are not up walking on it.   It is important for you to complete the blood thinner medication as prescribed by your doctor.  Continue to use the breathing machine which will help keep your temperature down.  It is common for your temperature to cycle up and down following surgery, especially at night when you are not up moving around and exerting yourself.  The breathing machine keeps your lungs expanded and your temperature down.  RANGE OF MOTION AND STRENGTHENING EXERCISES  These exercises are designed to help you keep full movement of your hip joint. Follow your caregiver's or physical therapist's instructions. Perform all exercises about fifteen times, three times per day or as directed. Exercise both hips, even if you have had only one joint replacement. These exercises can be done on a training (exercise) mat, on the floor, on a table or on a bed. Use whatever works the best and is most comfortable for you. Use music or television while you are exercising so that the exercises are a pleasant break in your day. This will  make your life better with the exercises acting as a break in routine you can look forward to.  Lying on your back, slowly slide your foot toward your buttocks, raising your knee up off the floor. Then slowly slide your foot back down until your leg is straight again.  Lying on your back spread your legs as far apart as you can without causing discomfort.  Lying on your side, raise your upper leg and foot straight up from the floor as far as is comfortable. Slowly lower the leg and repeat.  Lying on your back, tighten up the  muscle in the front of your thigh (quadriceps muscles). You can do this by keeping your leg straight and trying to raise your heel off the floor. This helps strengthen the largest muscle supporting your knee.  Lying on your back, tighten up the muscles of your buttocks both with the legs straight and with the knee bent at a comfortable angle while keeping your heel on the floor.   SKILLED REHAB INSTRUCTIONS: If the patient is transferred to a skilled rehab facility following release from the hospital, a list of the current medications will be sent to the facility for the patient to continue.  When discharged from the skilled rehab facility, please have the facility set up the patient's [place] prior to being released. Also, the skilled facility will be responsible for providing the patient with their medications at time of release from the facility to include their pain medication and their blood thinner medication. If the patient is still at the rehab facility at time of the two week follow up appointment, the skilled rehab facility will also need to assist the patient in arranging follow up appointment in our office and any transportation needs.  MAKE SURE YOU:  Understand these instructions.  Will watch your condition.  Will get help right away if you are not doing well or get worse.  Pick up stool softner and laxative for home use following surgery while on pain medications. Daily dry dressing changes as needed. In 4 days, you may remove your dressings and begin taking showers - no tub baths or soaking the incisions. Continue to use ice for pain and swelling after surgery. Do not use any lotions or creams on the incision until instructed by your surgeonDialysis Diet Dialysis is a treatment that cleans your blood. It is used when the kidneys are damaged. When you need dialysis, you should watch your diet. This is because some nutrients can build up in your blood between  treatments and make you sick. These nutrients are:  Potassium.  Phosphorus.  Sodium. Your doctor or dietitian will:  Tell you how much of these you can have.  Tell you if you need to look out for other nutrients too.  Help you plan meals.  Tell you how much to drink each day. WHAT DO I NEED TO KNOW ABOUT THIS DIET?  Limit potassium. Potassium is in milk, fruits, and vegetables.  Limit phosphorus. Phosphorus is in milk, cheese, beans, nuts, and carbonated beverages.  Limit salt (sodium). Foods that have a lot sodium include processed and cured meats, ready-made frozen meals, canned vegetables, and salty snack foods.  Do not use salt substitutes.  Try not to eat whole-grain foods and foods that have a lot of fiber.  Follow your doctor's instructions about how much to drink. You may be told to:  Write down what you drink.  Write down foods you eat that  are made mostly from water, such as gelatin and soups.  Drink from small cups.  Ask your doctor if you should take a medicine that binds phosphorus.  Take vitamin and mineral supplements only as told by your doctor.  Eat meat, poultry, fish, and eggs. Limit nuts and beans.  Before you cook potatoes, cut them into small pieces. Then boil them in unsalted water.  Drain all fluid from cooked vegetables and canned fruits before you eat them. WHAT FOODS CAN I EAT? Grains White bread. White rice. Cooked cereal. Unsalted popcorn. Tortillas. Pasta. Vegetables Fresh or frozen broccoli, carrots, and green beans. Cabbage. Cauliflower. Celery. Cucumbers. Eggplant. Radishes. Zucchini. Fruits Apples. Fresh or frozen berries. Fresh or canned pears, peaches, and pineapple. Grapes. Plums. Meats and Other Protein Sources Fresh or frozen beef, pork, chicken, and fish. Eggs. Low-sodium canned tuna or salmon. Dairy Cream cheese. Heavy cream. Ricotta cheese. Beverages Apple cider. Cranberry juice. Grape juice. Lemonade. Black  coffee. Condiments Herbs. Spices. Jam and jelly. Honey. Sweets and Desserts Sherbet. Cakes. Cookies. Fats and Oils Olive oil, canola oil, and safflower oil. Other Non-dairy creamer. Non-dairy whipped topping. Homemade broth without salt. The items listed above may not be a complete list of recommended foods or beverages. Contact your dietitian for more option WHAT FOODS ARE NOT RECOMMENDED? Grains Whole-grain bread. Whole-grain pasta. High-fiber cereal. Vegetables Potatoes. Beets. Tomatoes. Winter squash and pumpkin. Asparagus. Spinach. Parsnips. Fruits Star fruit. Bananas. Oranges. Kiwi. Nectarines. Prunes. Melon. Dried fruit. Avocado. Meats and Other Protein Sources Canned, smoked, and cured meats. Soil scientist. Sardines. Nuts and seeds. Peanut butter. Beans and legumes. Dairy Milk. Buttermilk. Yogurt. Cheese and cottage cheese. Processed cheese spreads.  Beverages Orange juice. Prune juice. Carbonated soft drinks. Condiments Salt. Salt substitutes. Soy sauce.  Sweets and Desserts Ice cream. Chocolate. Candied nuts. Fats and Oils Butter. Margarine. Other Ready-made frozen meals. Canned soups.  The items listed above may not be a complete list of foods and beverages to avoid. Contact your dietitian for more information.   This information is not intended to replace advice given to you by your health care provider. Make sure you discuss any questions you have with your health care provider.   Document Released: 05/28/2012 Document Revised: 12/18/2014 Document Reviewed: 06/30/2014 Elsevier Interactive Patient Education [employer].

## 2016-05-29 NOTE — Progress Notes (Signed)
Occupational Therapy Treatment Patient Details Name: Cassidy Tabet MRN: [id number] DOB: September 16, 1946 Today's Date: 05/29/2016    History of present illness 70 year old male with no significant medical history (had not seen a physician in many years) presented with about 1 month of nausea with daily vomiting. Also reports poor by mouth intake and subjective weight loss. Denied any diarrhea, hematemesis or bleeding per rectum. Patient went to the urgent care and was found to have a hemoglobin of 9.8 and sent to the ED. Lab work showing hyperproteinemia, abnormal lucency of lumbar vertebrae and multiple radiolucencies in the skull x-ray indicated towards multiple myeloma; now s/p IM fixation of Impending pathologic fracture, left femur with bone biopsy; WBAT; further imaging showing L3 compression fx and L5 lesion; Neurosurgery consulted, not surgical candidate at this time; they did not order a brace   OT comments  Continue to recommend CIR for rehab. Pt able to ambulate to sink today. Feel pt will continue to benefit from acute OT to increase independence prior to d/c.  Follow Up Recommendations  CIR    Equipment Recommendations  Other (comment) (TBD at next venue of care)    Recommendations for Other Services      Precautions / Restrictions Precautions Precautions: Fall Restrictions Weight Bearing Restrictions: Yes LLE Weight Bearing: Weight bearing as tolerated Other Position/Activity Restrictions: limit use of LUE       Mobility Bed Mobility Overal bed mobility: Needs Assistance Bed Mobility: Sit to Supine     Sit to supine: Min assist   General bed mobility comments: assist with LLE. Cues for scooting HOB.  Transfers Overall transfer level: Needs assistance   Transfers: Sit to/from Stand Sit to Stand: Mod assist       General transfer comment: RW in front of pt. Min guard for sit to stand from chair near sink with 2 pillows under bottom. Assist to boost from recliner  chair. cues for positioning of LLE.    Balance    Min guard for ambulation with RW.               ADL Overall ADL's : Needs assistance/impaired     Grooming: Sitting;Minimal assistance;Wash/dry hands;Applying deodorant (washed/dryed one hand)   Upper Body Bathing: Set up;Supervision/ safety;Sitting Upper Body Bathing Details (indicate cue type and reason): washed armpits     Upper Body Dressing : Moderate assistance Upper Body Dressing Details (indicate cue type and reason): managing back gown   Lower Body Dressing Details (indicate cue type and reason): OT adjusted left sock Toilet Transfer: Moderate assistance;Ambulation;RW (sit to stand from recliner-Mod A; Min guard-ambulating)           Functional mobility during ADLs: Rolling walker (Min guard-ambulation; Mod A-Min guard for sit to stand) General ADL Comments: encouraged pt to participate. Pt ambulated to sink.       Vision                     Perception     Praxis      Cognition  Awake/Alert Behavior During Therapy: [facility] for tasks assessed/performed;Anxious Overall Cognitive Status: Within Functional Limits for tasks assessed                       Extremity/Trunk Assessment                  Shoulder Instructions       General Comments      Pertinent Vitals/ Pain  Pain Assessment: 0-10 Pain Score: 6  Pain Location: LLE Pain Descriptors / Indicators: Other (Comment) (pulling) Pain Intervention(s): Monitored during session;Repositioned  Home Living                                          Prior Functioning/Environment              Frequency Min 2X/week     Progress Toward Goals  OT Goals(current goals can now be found in the care plan section)  Progress towards OT goals: Progressing toward goals-updated some goals and added a goal  Acute Rehab OT Goals Patient Stated Goal: not stated OT Goal Formulation: With patient/family Time For  Goal Achievement: 06/05/16 Potential to Achieve Goals: Good ADL Goals Pt Will Perform Grooming: standing;with min guard assist (3 tasks standing) Pt Will Perform Upper Body Bathing: with set-up;sitting Pt Will Perform Lower Body Bathing: with mod assist;sit to/from stand (with or without AE) Pt Will Perform Upper Body Dressing: with min guard assist;with supervision;sitting Pt Will Transfer to Toilet: with min guard assist;ambulating (3 in 1 over commode) Additional ADL Goal #1: complete sit - stand with min guard A  for [facility] transfer/transition  Plan Discharge plan remains appropriate    Co-evaluation                 End of Session Equipment Utilized During Treatment: Gait belt;Rolling walker   Activity Tolerance Patient limited by pain   Patient Left in bed;with call bell/phone within reach;with family/visitor present   Nurse Communication          Time: 6893-4068 OT Time Calculation (min): 19 min  Charges: OT General Charges $OT Visit: 1 Procedure OT Treatments $Self Care/Home Management : 8-22 mins  Benito Mccreedy OTR/L [phone] 05/29/2016, 12:51 PM

## 2016-05-29 NOTE — Progress Notes (Signed)
Assessment/ Plan: Pt is a 70 y.o. yo male who was admitted on 05/09/2016 with new renal failure in the setting of newly diagnosed myeloma  Assessment/Plan: 1. Renal - presenting creatinine over 10 with symptoms- no baseline information- also new diagnosis of myeloma which is the likely etiology- has been HD requiring since 5/31- doing on a TTS schedule- CLIP done- has spot TTS at [place]- is likely ESRD although some pts will improve their renal status with treatment of their myeloma- it remains to be seen if Thomas [name] will be one of those patients- Now s/p AVF placed on 6/14- has PC as well. Continuing to watch for signs of recovery  2. Myeloma- new dx- started on velcade per onc on 6/8  3. 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 Secondary hyperparathyroidism- PTH low- phos 5- added phoslo 5. HTN/volume- BP lowish- no BP meds- does not seem too volume overloaded- minimal goals with HD  6. Dispo- now possibly going to inpatient rehab-   Subjective: Interval History: Weak  Objective: Vital signs in last 24 hours: Temp:  [98 F (36.7 C)-98.2 F (36.8 C)] 98.1 F (36.7 C) (06/19 0540) Pulse Rate:  [65-80] 73 (06/19 0540) Resp:  [18-20] 20 (06/19 0540) BP: (116-142)/(59-75) 142/59 mmHg (06/19 0540) SpO2:  [97 %-100 %] 100 % (06/19 0540) Weight change:   Intake/Output from previous day: 06/18 0701 - 06/19 0700 In: 960 [P.O.:960] Out: 0  Intake/Output this shift:    General appearance: alert and cooperative Resp: clear to auscultation bilaterally Cardio: regular rate and rhythm, S1, S2 normal, no murmur, click, rub or gallop Extremities: extremities normal, atraumatic, no cyanosis or edema  TDC present and left BC AVF  Lab Results:  Recent Labs  05/29/16 0516  WBC 2.5*  HGB 8.6*  HCT 27.0*  PLT 47*   BMET:  Recent Labs  05/27/16 0540 05/29/16 0516  NA 130* 131*  K 4.6 3.8  CL 94* 96*  CO2 27 28  GLUCOSE 117* 119*  BUN 38* 35*  CREATININE  5.02* 4.64*  CALCIUM 8.1* 7.9*   No results for input(s): PTH in the last 72 hours. Iron Studies: No results for input(s): IRON, TIBC, TRANSFERRIN, FERRITIN in the last 72 hours. Studies/Results: No results found.  Scheduled: . sodium chloride   Intravenous Once  . sodium chloride   Intravenous Once  . acyclovir  200 mg Oral BID  . calcium acetate  667 mg Oral TID WC  . darbepoetin (ARANESP) injection - DIALYSIS  100 mcg Intravenous Q Sat-HD  . dexamethasone  40 mg Oral Weekly  . feeding supplement  1 Container Oral BID BM  . feeding supplement (PRO-STAT SUGAR FREE 64)  30 mL Oral Q1500  . guaiFENesin  600 mg Oral BID  . heparin subcutaneous  5,000 Units Subcutaneous Q8H  . multivitamin  1 tablet Oral QHS  . vitamin B-12  1,000 mcg Oral Daily  . Vitamin D (Ergocalciferol)  50,000 Units Oral Q7 days       LOS: 19 days   Thomas [name] C 05/29/2016,7:11 AM

## 2016-05-29 NOTE — Progress Notes (Signed)
Patient for discharge to Inpatient rehab, Room [phone]. Report given to Luz-RN using SBAR. All questions were answered. Patient and family made aware

## 2016-05-29 NOTE — Progress Notes (Signed)
Rehab admissions - I have approval for acute inpatient rehab admission for today from insurance carrier.  Bed available and family agreeable.  Will admit to inpatient rehab today.  Call me for questions.  [phone]

## 2016-05-29 NOTE — H&P (Signed)
Physical Medicine and Rehabilitation Admission H&P    Chief Complaint  Patient presents with  . Left     HPI: Thomas [name] is a 70 y.o. (no medical follow up) with onset of back pain March  2017 treated with NSAIDS and conservative care. He was admitted via ED 5/31 with nausea due to uremia, renal failure with BUN/Cr- 61/10.5 and anemia.  Dr. Kewan Footman consulted and HD initiated. Bone scan done due to complaints to severe back pain and showed multiple bony lesions suspicious for MM--largest in left iliac wing, L3 compression fracture and question L5 compression fracture, and left mid shaft lesion with thinning at risk for pathologic fracture.  He underwent IM fixation of left femur on 06/4 by Dr. Lyla Glassing. WBAT LLE with spica wrap left hip.  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  ABLA treated with transfusion and thrombocytopenia being monitored.   Dr. Tammi Klippel consulted and recommended palliative XRT to spine and left femur.  Dr. Alvy Bimler consulted for treatment options and patient started on Velcade injections days 1,4,8 and 11 on 06/8.  To have weekly pulse decadron 40 mg in conjunction  with injections. L-AVF placed by Dr. Bridgett Larsson on 6/14 and HD ongoing on TTS.  Blood pressures on low side but fluid overload noted and low grade fevers noted on 06/15.  2 D echo with EF 60%-65% and no wall abnormality. Patient was admitted for a comprehensive rehabilitation program   Review of Systems  HENT: Negative for hearing loss.   Eyes: Positive for blurred vision (right due to corneal abrasion.). Negative for double vision.  Respiratory: Negative for cough and shortness of breath.   Cardiovascular: Negative for chest pain and palpitations.  Gastrointestinal: Positive for heartburn and diarrhea. Negative for nausea and vomiting.  Genitourinary: Negative for dysuria and urgency.  Musculoskeletal: Positive for myalgias, back pain and joint pain.  Skin:  Negative for itching and rash.  Neurological: Positive for weakness. Negative for dizziness, speech change and headaches.  Psychiatric/Behavioral: The patient is nervous/anxious and has insomnia.       Past Medical History  Diagnosis Date  . Compression fracture     Past Surgical History  Procedure Laterality Date  . Mouth surgery    . Femur im nail Left 05/14/2016    Procedure: INTRAMEDULLARY (IM) NAIL FEMORAL;  Surgeon: Rod Can, MD;  Location: [facility];  Service: Orthopedics;  Laterality: Left;  . Bone biopsy Left 05/14/2016    Procedure: LEFT FEMORAL BIOPSY WITH INTRAOPERATIVE FROZEN SECTIONS ;  Surgeon: Rod Can, MD;  Location: [facility];  Service: Orthopedics;  Laterality: Left;  . Av fistula placement Left 05/24/2016    Procedure: ARTERIOVENOUS (AV) FISTULA CREATION-LEFT;  Surgeon: Conrad Niles, MD;  Location: [facility];  Service: Vascular;  Laterality: Left;    Family History  Problem Relation Age of Onset  . Diabetes Father     Social History:  reports that he has quit smoking. He does not have any smokeless tobacco history on file. He reports that he does not drink alcohol or use illicit drugs.     Allergies  Allergen Reactions  . Penicillins Rash and Other (See Comments)    Has patient had a PCN reaction causing immediate rash, facial/tongue/throat swelling, SOB or lightheadedness with hypotension: YES + Reaction causing SEVERE RASH involving MUCUS MEMBRANES or SKIN NECROSIS >> YES Reaction that required hospitalization: NO Reaction occurring within the last 10 years: NO If all of the  above answers are "NO", then may proceed with Cephalosporin use.   . Dilaudid [Hydromorphone Hcl] Other (See Comments)    hallucinations  . Codeine Other (See Comments)    "MAKES ME JUMPY"    No prescriptions prior to admission    Home: [place] expects to be discharged to:: Other (Comment) (inpatient rehab vs SNF) Living Arrangements: Spouse/significant  other Available Help at Discharge: Family, Available 24 hours/day Type of Home: House Home Access: Stairs to enter [employer] of Steps: 1 step down and then 1 step up into home Home Layout: One level Additional Comments: Pt, wife, and daughter expressed understanding that he will need rehabilitation once medically stable  Lives With: Spouse   Functional History: Prior Function Level of Independence: Independent  Functional Status:  Mobility: Bed Mobility Overal bed mobility: Needs Assistance Bed Mobility: Supine to Sit Rolling: Supervision Supine to sit: Mod assist, HOB elevated (help for LLE and pt then needed trunk assist due to LUE) Sit to sidelying: Min assist General bed mobility comments: nursing educated to use drop arm chair to get back to bed Transfers Overall transfer level: Needs assistance Equipment used: 1 person hand held assist Transfers: Lateral/Scoot Transfers (drop arm chair ) Sit to Stand: Min assist Stand pivot transfers: Min assist  Lateral/Scoot Transfers: Mod assist, From elevated surface (PT used bed pad to decrease effort due to new fistula LUE) General transfer comment: verbal cues for hand placement, assist to power up and stabilize initial standing balance Ambulation/Gait Ambulation/Gait assistance: Min assist, +2 safety/equipment Ambulation Distance (Feet): 35 Feet Assistive device: Rolling walker (2 wheeled) Gait Pattern/deviations: Step-to pattern, Antalgic General Gait Details: withheld Gait velocity: decreased Gait velocity interpretation: Below normal speed for age/gender    ADL: ADL Overall ADL's : Needs assistance/impaired Grooming: Wash/dry hands, Sitting, Set up Upper Body Bathing: Sitting, Minimal assitance Lower Body Bathing: Maximal assistance Upper Body Dressing : Minimal assistance, Sitting Upper Body Dressing Details (indicate cue type and reason): doffed front opening gown Lower Body Dressing: Total  assistance Toilet Transfer: Stand-pivot, RW, Minimal assistance, Moderate assistance Toileting- Clothing Manipulation and Hygiene: Total assistance Functional mobility during ADLs: Minimal assistance, Moderate assistance General ADL Comments: Pt fatigued having been up in chair since this morning, limited session.  Cognition: Cognition Overall Cognitive Status: Within Functional Limits for tasks assessed Orientation Level: Oriented X4 Cognition Arousal/Alertness: Awake/alert Behavior During Therapy: WFL for tasks assessed/performed Overall Cognitive Status: Within Functional Limits for tasks assessed    Blood pressure 133/52, pulse 70, temperature 97.8 F (36.6 C), temperature source Oral, resp. rate 18, height _0  (1.854 m), weight 74.072 kg (163 lb 4.8 oz), SpO2 100 %. Physical Exam  Nursing note and vitals reviewed. Constitutional: He is oriented to person, place, and time. He appears well-developed and well-nourished.  HENT:  Head: Normocephalic and atraumatic.  Mouth/Throat: Oropharynx is clear and moist.  Edentulous Eyes: Conjunctivae are normal. Pupils are equal, round, and reactive to light.  Neck: Normal range of motion. Neck supple.  Cardiovascular: Normal rate and regular rhythm.   Respiratory: Effort normal and breath sounds normal. No stridor. No respiratory distress. He has no wheezes.  GI: Soft. Bowel sounds are normal. He exhibits no distension. There is no tenderness.  Musculoskeletal: He exhibits edema (moderate edema left thigh) and tenderness (Left hip/thigh pain with ROM.) Neurological: He is alert and oriented to person, place, and time.  Motor: Bilateral upper extremities: 4/5 proximal to distal Right lower extremity 4-/5 proximal to distal Left lower extremity: 2/5  proximal to distal  Sensation intact light touch  Skin: Skin is warm and dry.  Incisions C/D/I.   Sacral decubitus.  Psychiatric: He has a normal mood and affect. His behavior is normal.  Judgment and thought content normal.    Results for orders placed or performed during the hospital encounter of 05/09/16 (from the past 48 hour(s))  CBC     Status: Abnormal   Collection Time: 05/25/16  7:10 AM  Result Value Ref Range   WBC 3.6 (L) 4.0 - 10.5 K/uL   RBC 2.77 (L) 4.22 - 5.81 MIL/uL   Hemoglobin 7.8 (L) 13.0 - 17.0 g/dL    Comment: CONSISTENT WITH PREVIOUS RESULT   HCT 24.9 (L) 39.0 - 52.0 %   MCV 89.9 78.0 - 100.0 fL   MCH 28.2 26.0 - 34.0 pg   MCHC 31.3 30.0 - 36.0 g/dL   RDW 15.0 11.5 - 15.5 %   Platelets 62 (L) 150 - 400 K/uL    Comment: CONSISTENT WITH PREVIOUS RESULT  Renal function panel     Status: Abnormal   Collection Time: 05/25/16  7:11 AM  Result Value Ref Range   Sodium 131 (L) 135 - 145 mmol/L   Potassium 4.3 3.5 - 5.1 mmol/L   Chloride 97 (L) 101 - 111 mmol/L   CO2 28 22 - 32 mmol/L   Glucose, Bld 108 (H) 65 - 99 mg/dL   BUN 35 (H) 6 - 20 mg/dL   Creatinine, Ser 6.03 (H) 0.61 - 1.24 mg/dL   Calcium 7.9 (L) 8.9 - 10.3 mg/dL   Phosphorus 4.6 2.5 - 4.6 mg/dL   Albumin 1.8 (L) 3.5 - 5.0 g/dL   GFR calc non Af Amer 9 (L) >60 mL/min   GFR calc Af Amer 10 (L) >60 mL/min    Comment: (NOTE) The eGFR has been calculated using the CKD EPI equation. This calculation has not been validated in all clinical situations. eGFR's persistently <60 mL/min signify possible Chronic Kidney Disease.    Anion gap 6 5 - 15  Renal function panel     Status: Abnormal   Collection Time: 05/26/16  7:33 AM  Result Value Ref Range   Sodium 131 (L) 135 - 145 mmol/L   Potassium 4.1 3.5 - 5.1 mmol/L   Chloride 95 (L) 101 - 111 mmol/L   CO2 27 22 - 32 mmol/L   Glucose, Bld 156 (H) 65 - 99 mg/dL   BUN 22 (H) 6 - 20 mg/dL   Creatinine, Ser 3.98 (H) 0.61 - 1.24 mg/dL    Comment: DELTA CHECK NOTED   Calcium 8.1 (L) 8.9 - 10.3 mg/dL   Phosphorus 4.6 2.5 - 4.6 mg/dL   Albumin 2.0 (L) 3.5 - 5.0 g/dL   GFR calc non Af Amer 14 (L) >60 mL/min   GFR calc Af Amer 16 (L) >60  mL/min    Comment: (NOTE) The eGFR has been calculated using the CKD EPI equation. This calculation has not been validated in all clinical situations. eGFR's persistently <60 mL/min signify possible Chronic Kidney Disease.    Anion gap 9 5 - 15   No results found.     Medical Problem List and Plan: 1.  Debilitation with decreased mobility secondary to Multiple myeloma/left femur pathologic fracture 2.  DVT Prophylaxis/Anticoagulation: Subcutaneous heparin. Monitor platelet counts and any signs of bleeding 3. Pain Management: (Chronic and post-op) Hydrocodone as needed 4. Mood: Xanax 0.25 mg twice daily as needed 5. Neuropsych: This patient is capable  of making decisions on his on behalf. 6. Skin/Wound Care: Monitor sacral decub--foam dressing for now. Routine pressure relief measures. Nephro between  Meals.   7. Fluids/Electrolytes/Nutrition:  Monitor I/O. Renal diet with 1200 ccFR. 8. Multiple myeloma: Started on Velcade 06/08--follow-up per her oncology services. Weekly pulse dose dexamethasone. 9. ESRD: Strict I/O. 1200 cc/FR.  10. Volume overload/Hypotension:  Monitor for now.  11. Thrombocytopenia: Progressive drop--Monitor with serial checks and for signs of bleeding.  12. Vitamin B12 deficiency: on supplement 13. Left femur pending pathologic fracture s/p IM nailing; WBAT.  14. Diarrhea: due to multiple laxatives. Will discontinue mirlax and change colace and senna to prn.  15. Acute on chronic anemia: Monitor. Transfuse if necessary 16. HTN: Monitor and provide prns in accordance with increased physical exertion and pain  Post Admission Physician Evaluation: 1. Functional deficits secondary  to Multiple myeloma/left femur pathologic fracture. 2. Patient is admitted to receive collaborative, interdisciplinary care between the physiatrist, rehab nursing staff, and therapy team. 3. Patient's level of medical complexity and substantial therapy needs in context of that medical  necessity cannot be provided at a lesser intensity of care such as a SNF. 4. Patient has experienced substantial functional loss from his/her baseline which was documented above under the "Functional History" and "Functional Status" headings.  Judging by the patient's diagnosis, physical exam, and functional history, the patient has potential for functional progress which will result in measurable gains while on inpatient rehab.  These gains will be of substantial and practical use upon discharge  in facilitating mobility and self-care at the household level. 5. Physiatrist will provide 24 hour management of medical needs as well as oversight of the therapy plan/treatment and provide guidance as appropriate regarding the interaction of the two. 6. 24 hour rehab nursing will assist with safety, skin/wound care, disease management, medication administration, pain management and patient education and help integrate therapy concepts, techniques,education, etc. 7. PT will assess and treat for/with: Lower extremity strength, range of motion, stamina, balance, functional mobility, safety, adaptive techniques and equipment, woundcare, coping skills, pain control, education.   Goals are: Mod I. 8. OT will assess and treat for/with: ADL's, functional mobility, safety, upper extremity strength, adaptive techniques and equipment, wound mgt, ego support, and community reintegration.   Goals are: Mod I. Therapy may proceed with showering this patient. 9. Case Management and Social Worker will assess and treat for psychological issues and discharge planning. 10. Team conference will be held weekly to assess progress toward goals and to determine barriers to discharge. 11. Patient will receive at least 3 hours of therapy per day at least 5 days per week. 12. ELOS: 8-11 days.       13. Prognosis:  good  Delice Lesch, MD 05/26/2016

## 2016-05-29 NOTE — Progress Notes (Signed)
Thomas [name]   DOB:[birth year]   XM#:[id number]    Subjective: He is seen with wife present. He is tolerating dialysis well. He is able to get out of bed and sat on the chair and walk short distance with a walker without much back pain. He was able to mobilize short distances. He denies worsening back pain. Constipation had resolved. He tolerated Velcade well. He had mild rash but is not causing any trouble. He denies new neurological deficit. His main complaints today is poor dietary intake due to unpleasant food served in the hospital and difficulties tolerating certain texture or nutritional supplement  Objective:  Filed Vitals:   05/29/16 0540 05/29/16 0746  BP: 142/59 138/63  Pulse: 73 69  Temp: 98.1 F (36.7 C) 98.6 F (37 C)  Resp: 20 18     Intake/Output Summary (Last 24 hours) at 05/29/16 0836 Last data filed at 05/29/16 0746  Gross per 24 hour  Intake   1080 ml  Output      0 ml  Net   1080 ml    GENERAL:alert, no distress and comfortable SKIN: skin color, texture, turgor are normal, no rashes or significant lesions EYES: normal, Conjunctiva are pink and non-injected, sclera clear Musculoskeletal:no cyanosis of digits and no clubbing  NEURO: alert & oriented x 3 with fluent speech, no focal motor/sensory deficits   Labs:  Lab Results  Component Value Date   WBC 2.5* 05/29/2016   HGB 8.6* 05/29/2016   HCT 27.0* 05/29/2016   MCV 89.7 05/29/2016   PLT 47* 05/29/2016   NEUTROABS 1.3* 05/29/2016    Lab Results  Component Value Date   NA 131* 05/29/2016   K 3.8 05/29/2016   CL 96* 05/29/2016   CO2 28 05/29/2016    Assessment & Plan:   IgG kappa multiple myeloma I had long discussion with the patient and family members regarding 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 The patient presented with significant bone lesions, renal failure and pancytopenia. Further testing from cytogenetics and FISH anaylsis from recent bone marrow biopsy is pending but biopsy  of his bone from the femur confirmed diagnosis of multiple myeloma. He was started on weekly pulse dose dexamethasone 40 mg on 05/18/16. He was started on Velcade subcutaneous injections on days 1,4,8,11 on 05/18/16. I tried to avoid weekend injection. Next dose is due today for day 11 treatment but due to pancytopenia, I plan to hold and recheck his blood work in 3 days I will get chemotherapy certified nursing staff from [facility] to proceed with Velcade injection at [facility] So far he tolerated treatment well apart from very mild skin rash which is expected. 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 There is no contraindication for him to receive dexamethasone or Velcade while on hemodialysis as they are not renally cleared Hopefully, his hemodialysis treatment can be transitioned to outpatient treatment  Significant bone lesions throughout He had prophylactic surgery to the left femur. MRI shows significant lesion in the lumbar spine. He was seen by radiation oncologist with plan for radiation therapy in a palliative fashion to the femur and the lumbar spine. Non-urgent His pain is currently well controlled with current pain medication regimen.  Severe pancytopenia, vitamin B-12 deficiency Multifactorial, likely due to multiple myeloma, side effects of Velcade and severe vitamin B-12 deficiency and renal failure I agreed for him to receive blood transfusion to keep hemoglobin greater than 7, He is receiving Aranesp I have added weekly injection of vitamin B 12 on 05/18/16 & 05/25/16. He will continue high-dose supplement daily by mouth I held treatment today due to low platelet count  Severe constipation,  resolved Multifactorial likely due to dehydration and medication side effects This has resolved with aggressive laxative therapy  Severe vitamin D deficiency I started him on high-dose replacement therapy, he will get high dose vitamin D every Thursday  Severe protein calorie malnutrition Continue dietitian consult and frequent small meals I recommend discussion with dietitian for other nutritional supplement choices   Recent cough This is nonproductive. He has no evidence of recent leukocytosis. Chest x-ray confirmed atelectasis. Recommend incentive spirometry and out of bed as much as possible  DVT prophylaxis Hold heparin SQ if platelet count is < 50,000  Discharge planning Unknown. Apparently, there is plan for possible inpatient rehabilitation.  I will return on Thursday morning 06/01/16 to check on him before day 11 treatment Hopefully, we can transition his future treatment to outpatient   [facility], Fadi Menter, MD 05/29/2016  8:36 AM

## 2016-05-29 NOTE — Progress Notes (Signed)
Physical Therapy Treatment Patient Details Name: Thomas [name] MRN: [id number] DOB: [birth year] Today's Date: 05/29/2016    History of Present Illness 70 year old male with no significant medical history (had not seen a physician in many years) presented with about 1 month of nausea with daily vomiting. Also reports poor by mouth intake and subjective weight loss. Denied any diarrhea, hematemesis or bleeding per rectum. Patient went to the urgent care and was found to have a hemoglobin of 9.8 and sent to the ED. Lab work showing hyperproteinemia, abnormal lucency of lumbar vertebrae and multiple radiolucencies in the skull x-ray indicated towards multiple myeloma; now s/p IM fixation of Impending pathologic fracture, left femur with bone biopsy; WBAT; further imaging showing L3 compression fx and L5 lesion; Neurosurgery consulted, not surgical candidate at this time; they did not order a brace    PT Comments    Pt is clearly progressing now, with good standing tolerance and one residual symptom on L hip of erythematous irritated skin near hip incision.  His family and nursing aware of the issue, and will receive ABT until better as planned.  Has need for PT to continue acutely to progress gait to PLOF as tolerated.  Follow Up Recommendations  CIR     Equipment Recommendations  Rolling walker with 5" wheels;3in1 (PT);Wheelchair (measurements PT);Wheelchair cushion (measurements PT)    Recommendations for Other Services Rehab consult     Precautions / Restrictions Precautions Precautions: Fall (Telemetry) Restrictions Weight Bearing Restrictions: Yes LLE Weight Bearing: Weight bearing as tolerated Other Position/Activity Restrictions: Limit pressure on walker on LUE, and no push or pull due to new fistula    Mobility  Bed Mobility Overal bed mobility: Needs Assistance Bed Mobility: Supine to Sit     Supine to sit: Mod assist;Min assist;HOB elevated (minor LLE help then assisted  trunk)        Transfers Overall transfer level: Needs assistance Equipment used: Rolling walker (2 wheeled);1 person hand held assist Transfers: Sit to/from [facility] Sit to Stand: Min assist Stand pivot transfers: Min assist       General transfer comment: verbal cues for hand placement, assist to power up and stabilize initial standing balance  Ambulation/Gait Ambulation/Gait assistance: Min guard;Min assist (dense cues for LUE) Ambulation Distance (Feet): 35 Feet Assistive device: Rolling walker (2 wheeled) Gait Pattern/deviations: Step-through pattern;Step-to pattern;Decreased dorsiflexion - right;Decreased dorsiflexion - left;Shuffle;Wide base of support Gait velocity: decreased Gait velocity interpretation: Below normal speed for age/gender General Gait Details: monitored LUE to minimize pressure and to assist with walker turns   Stairs            Wheelchair Mobility    Modified Rankin (Stroke Patients Only)       Balance     Sitting balance-Leahy Scale: Good Sitting balance - Comments: increasing sitting tolerance x being willing to flex L knee to place foot under her Postural control: Posterior lean Standing balance support: Bilateral upper extremity supported (minimal on LUE) Standing balance-Leahy Scale: Fair                      Cognition Arousal/Alertness: Awake/alert Behavior During Therapy: WFL for tasks assessed/performed Overall Cognitive Status: Within Functional Limits for tasks assessed                      Exercises Total Joint Exercises Ankle Circles/Pumps: AROM;AAROM;Both;10 reps Quad Sets: AROM;Both;10 reps Heel Slides: Strengthening;Both;10 reps Long Arc Quad: AROM;Both;10 reps    General Comments  General comments (skin integrity, edema, etc.): Pt is demonstrating a better level of confidence although in beginning of session he does not demonstrate a comfort with standign and trying to move.   Fearful of having a fall but feeling better after AM session      Pertinent Vitals/Pain Pain Assessment: Faces Faces Pain Scale: Hurts even more Pain Location: L thigh Pain Descriptors / Indicators: Cramping;Spasm Pain Intervention(s): Monitored during session;Premedicated before session;Repositioned    Home Living                      Prior Function            PT Goals (current goals can now be found in the care plan section) Acute Rehab PT Goals Patient Stated Goal: get stronger Progress towards PT goals: Progressing toward goals    Frequency  Min 3X/week    PT Plan Current plan remains appropriate    Co-evaluation             End of Session Equipment Utilized During Treatment: Gait belt Activity Tolerance: Patient tolerated treatment well;Patient limited by fatigue Patient left: in chair;with call bell/phone within reach;with family/visitor present     Time: 1010-1039 PT Time Calculation (min) (ACUTE ONLY): 29 min  Charges:  $Gait Training: 8-22 mins $Therapeutic Exercise: 8-22 mins                    G Codes:      Ramond Dial Jun 12, 2016, 12:38 PM   Mee Hives, PT MS Acute Rehab Dept. Number: [place] [facility] [place]

## 2016-05-30 ENCOUNTER — Inpatient Hospital Stay (HOSPITAL_COMMUNITY): Payer: PPO | Admitting: Occupational Therapy

## 2016-05-30 ENCOUNTER — Inpatient Hospital Stay (HOSPITAL_COMMUNITY): Payer: PPO | Admitting: Physical Therapy

## 2016-05-30 DIAGNOSIS — S72302D Unspecified fracture of shaft of left femur, subsequent encounter for closed fracture with routine healing: Secondary | ICD-10-CM

## 2016-05-30 LAB — RENAL FUNCTION PANEL
ALBUMIN: 2.2 g/dL — AB (ref 3.5–5.0)
ANION GAP: 9 (ref 5–15)
BUN: 46 mg/dL — AB (ref 6–20)
CO2: 26 mmol/L (ref 22–32)
Calcium: 8.1 mg/dL — ABNORMAL LOW (ref 8.9–10.3)
Chloride: 95 mmol/L — ABNORMAL LOW (ref 101–111)
Creatinine, Ser: 6.09 mg/dL — ABNORMAL HIGH (ref 0.61–1.24)
GFR calc Af Amer: 10 mL/min — ABNORMAL LOW (ref >60)
GFR, EST NON AFRICAN AMERICAN: 8 mL/min — AB (ref >60)
GLUCOSE: 158 mg/dL — AB (ref 65–99)
POTASSIUM: 4.1 mmol/L (ref 3.5–5.1)
Phosphorus: 4 mg/dL (ref 2.5–4.6)
SODIUM: 130 mmol/L — AB (ref 135–145)

## 2016-05-30 LAB — CBC
HEMATOCRIT: 27.3 % — AB (ref 39.0–52.0)
HEMOGLOBIN: 8.8 g/dL — AB (ref 13.0–17.0)
MCH: 29 pg (ref 26.0–34.0)
MCHC: 32.2 g/dL (ref 30.0–36.0)
MCV: 90.1 fL (ref 78.0–100.0)
Platelets: 55 10*3/uL — ABNORMAL LOW (ref 150–400)
RBC: 3.03 MIL/uL — ABNORMAL LOW (ref 4.22–5.81)
RDW: 14.9 % (ref 11.5–15.5)
WBC: 3.1 10*3/uL — AB (ref 4.0–10.5)

## 2016-05-30 MED ORDER — ALPRAZOLAM 0.5 MG PO TABS
ORAL_TABLET | ORAL | Status: AC
  Filled 2016-05-30: qty 1

## 2016-05-30 MED ORDER — HEPARIN SODIUM (PORCINE) 1000 UNIT/ML DIALYSIS: 20.0000 [IU]/kg | INTRAMUSCULAR | Status: DC | PRN

## 2016-05-30 MED ORDER — SODIUM CHLORIDE 0.9 % IV SOLN: 100.0000 mL | INTRAVENOUS | Status: DC | PRN

## 2016-05-30 MED ORDER — PENTAFLUOROPROP-TETRAFLUOROETH EX AERO: 1.0000 "application " | INHALATION_SPRAY | CUTANEOUS | Status: DC | PRN

## 2016-05-30 MED ORDER — LIDOCAINE-PRILOCAINE 2.5-2.5 % EX CREA: 1.0000 "application " | TOPICAL_CREAM | CUTANEOUS | Status: DC | PRN

## 2016-05-30 MED ORDER — HEPARIN SODIUM (PORCINE) 1000 UNIT/ML DIALYSIS: 1000.0000 [IU] | INTRAMUSCULAR | Status: DC | PRN

## 2016-05-30 MED ORDER — LIDOCAINE HCL (PF) 1 % IJ SOLN: 5.0000 mL | INTRAMUSCULAR | Status: DC | PRN

## 2016-05-30 MED ORDER — ALTEPLASE 2 MG IJ SOLR: 2.0000 mg | Freq: Once | INTRAMUSCULAR | Status: DC | PRN

## 2016-05-30 NOTE — Progress Notes (Signed)
I met with the patient today and have also discussed his case with Dr. Tammi Klippel and Dr. Alvy Bimler. 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 We would recommend treating his lumbar spine as well as his left femur in the next few weeks as his symptoms and clinical picture appear stable. We will plan to see him as an outpatient if he is able to discharge home from rehab in the next 2 or so weeks, however if he remains inpatient status with rehabilitation medicine, we will be happy to coordinate simulation and treatment to begin during that time. I discussed the recommendations for 30 Gy to the left femur and lumbar spine over 10 fractions, and have detailed the risks, benefits, short, and long term effects of radiotherapy. He is interested in proceeding as indicated and states he is willing to do the treatments Dr. Alvy Bimler and Dr. Tammi Klippel have agreed upon.  Carola Rhine, PAC

## 2016-05-30 NOTE — Progress Notes (Signed)
Occupational Therapy Session Note  Patient Details  Name: Thomas [name] MRN: [id number] Date of Birth: 04/25/1946  Today's Date: 05/30/2016 OT Individual Time: 1451-1530 OT Individual Time Calculation (min): 39 min    Short Term Goals: Week 1:  OT Short Term Goal 1 (Week 1): Pt will complete LB dressing with supervision and use of AE as needed OT Short Term Goal 2 (Week 1): Pt will complete 2 grooming tasks in standing at sink with supervision OT Short Term Goal 3 (Week 1): Pt will complete sit > stand froom various height surfaces to increase success with functional transfers OT Short Term Goal 4 (Week 1): Pt will complete toilet transfer to [facility] over toilet with supervision and LRAD  Skilled Therapeutic Interventions/Progress Updates:    Treatment session with focus on standing tolerance. Provided pt education regarding wearing TED stockings to decrease risk of complications. Pt reported inability to tolerate much food for lunch and vomited just prior to lunch. Pt completed activity at standing to increase standing tolerance for improved success in completing self care skills. Pt verbally expressed understanding. Tolerated standing for x6 minutes using table in room for contact assist. Returned to bed with all needs in reach in preparation for dialysis.   Therapy Documentation Precautions:  Precautions Precautions: Fall Restrictions Weight Bearing Restrictions: Yes LLE Weight Bearing: Weight bearing as tolerated General:   Vital Signs: Therapy Vitals Pulse Rate: 70 BP: 131/67 mmHg Patient Position (if appropriate): Lying Pain: Pain Assessment Pain Assessment: No/denies pain ADL:   Exercises:   Other Treatments:    See Function Navigator for Current Functional Status.   Therapy/Group: Individual Therapy  Dierdre Searles 05/30/2016, 4:12 PM

## 2016-05-30 NOTE — Evaluation (Signed)
Physical Therapy Assessment and Plan  Patient Details  Name: Thomas [name] MRN: [id number] Date of Birth: 1946/08/21  PT Diagnosis: Abnormality of gait, Difficulty walking, Dizziness and giddiness, Muscle weakness and Pain in LLE Rehab Potential: Good ELOS: 8-12 days   Today's Date: 05/30/2016 PT Individual Time: 1300-1415 PT Individual Time Calculation (min): 75 min    Problem List:  Patient Active Problem List   Diagnosis Date Noted  . 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 Physical deconditioning   . Closed fracture of shaft of left femur ([place])   . Adjustment disorder with mixed anxiety and depressed mood   . Postoperative pain   . Bilateral low back pain without sciatica   . Pressure ulcer   . Thrombocytopenia ([place])   . Arterial hypotension   . Functional diarrhea   . Anemia of chronic disease   . Multiple myeloma not having achieved remission ([place])   . Antineoplastic chemotherapy induced pancytopenia ([place])   . Low back pain   . Post-op pain   . Surgery, elective   . Acute blood loss anemia   . ESRD on dialysis ([place])   . Essential hypertension   . Urinary tract infection, site not specified   . Vitamin B12 deficiency   . Multiple myeloma ([place]) 05/14/2016  . Acute renal failure ([place])   . 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 Anemia due to bone marrow failure ([place])   . 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 Occult blood positive stool 05/10/2016  . Malnutrition of moderate degree 05/10/2016    Past Medical History:  Past Medical History  Diagnosis Date  . Compression fracture   . Chronic kidney disease   . Cancer [facility])    Past Surgical History:  Past Surgical History  Procedure Laterality Date  . Mouth surgery    . Femur im nail Left 05/14/2016    Procedure: INTRAMEDULLARY (IM) NAIL FEMORAL;  Surgeon: Rod Can, MD;  Location: [facility];  Service: Orthopedics;  Laterality: Left;  . Bone biopsy Left 05/14/2016    Procedure:  LEFT FEMORAL BIOPSY WITH INTRAOPERATIVE FROZEN SECTIONS ;  Surgeon: Rod Can, MD;  Location: [facility];  Service: Orthopedics;  Laterality: Left;  . Av fistula placement Left 05/24/2016    Procedure: ARTERIOVENOUS (AV) FISTULA CREATION-LEFT;  Surgeon: Conrad Oaklyn, MD;  Location: [facility] OR;  Service: Vascular;  Laterality: Left;    Assessment & Plan Clinical Impression: A 70 y.o. (no medical follow up) with onset of back pain March 2017 treated with NSAIDS and conservative care. He was admitted via ED 5/31 with nausea due to uremia, renal failure with BUN/Cr- 61/10.5 and anemia. Dr. Martice Footman consulted and HD initiated. Bone scan done due to complaints to severe back pain and showed multiple bony lesions suspicious for MM--largest in left iliac wing, L3 compression fracture and question L5 compression fracture, and left mid shaft lesion with thinning at risk for pathologic fracture. He underwent IM fixation of left femur on 06/4 by Dr. Lyla Glassing. WBAT LLE with spica wrap left hip. 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 ABLA treated with transfusion and thrombocytopenia being monitored.   Dr. Tammi Klippel consulted and recommended palliative XRT to spine and left femur. Dr. Alvy Bimler consulted for treatment options and patient started on Velcade injections days 1,4,8 and 11 on 06/8. To have weekly pulse decadron 40 mg in conjunction with injections. L-AVF placed by Dr. Bridgett Larsson on 6/14  and HD ongoing on TTS. Blood pressures on low side but fluid overload noted and low grade fevers noted on 06/15. 2 D echo with EF 60%-65% and no wall abnormality. Patient to be admitted for a comprehensive inpatient rehabilitation program. Patient transferred to CIR on 05/29/2016 .   Patient currently requires min with mobility secondary to muscle weakness and muscle joint tightness, decreased cardiorespiratoy endurance, delayed processing and decreased standing balance, decreased  postural control and decreased balance strategies.  Prior to hospitalization, patient was modified independent  with mobility and lived with Spouse in a House home.  Home access is 1 step down and then 1 step up into homeStairs to enter.  Patient will benefit from skilled PT intervention to maximize safe functional mobility, minimize fall risk and decrease caregiver burden for planned discharge home with 24 hour supervision.  Anticipate patient will benefit from follow up OP at discharge.  PT - End of Session Activity Tolerance: Tolerates 30+ min activity with multiple rests Endurance Deficit: Yes Endurance Deficit Description: requires multiple rest breaks PT Assessment Rehab Potential (ACUTE/IP ONLY): Good Barriers to Discharge: Inaccessible home environment PT Patient demonstrates impairments in the following area(s): Balance;Endurance;Motor;Pain;Safety PT Transfers Functional Problem(s): Bed Mobility;Bed to Chair;Furniture;Car PT Locomotion Functional Problem(s): Ambulation;Stairs PT Plan PT Intensity: Minimum of 1-2 x/day ,45 to 90 minutes PT Frequency: 5 out of 7 days PT Duration Estimated Length of Stay: 8-12 days PT Treatment/Interventions: Ambulation/gait training;Balance/vestibular training;Community reintegration;Discharge planning;Neuromuscular re-education;UE/LE Coordination activities;UE/LE Strength taining/ROM;Functional mobility training;Psychosocial support;Therapeutic Exercise;Therapeutic Activities;Stair training;DME/adaptive equipment instruction;Patient/family education;Pain management PT Transfers Anticipated Outcome(s): mod I with LRAD PT Locomotion Anticipated Outcome(s): S in home and community with LRAD PT Recommendation Recommendations for Other Services: Neuropsych consult Follow Up Recommendations: Outpatient PT Patient destination: Home Equipment Recommended: To be determined Equipment Details: likely no equipment needed, has SPC, w/c, RW and BSC at  home  Skilled Therapeutic Intervention Pt received seated in recliner, denies pain at rest however does report pain towards end of session but does not rate. Initial PT session performed and completed. Required minA/min guard overall as described below. Pt limited by strength impairments LLE, generalized weakness d/t prolonged bedrest, pain, and nausea. Pt and family educated in rehab process, daily schedule, estimated length of stay, and goals at S/mod I. Also educated pt in heel slides, ankle pumps, and hip abduction/adduction per tolerance to perform outside of regular therapy time. Pt remained supine in bed at completion of session, all needs in reach.   PT Evaluation Precautions/Restrictions Precautions Precautions: Fall Restrictions Weight Bearing Restrictions: Yes LLE Weight Bearing: Weight bearing as tolerated General Chart Reviewed: Yes Response to Previous Treatment: Patient reporting fatigue but able to participate. Family/Caregiver Present: Yes  Pain Pain Assessment Pain Assessment: No/denies pain Home Living/Prior Functioning Home Living Available Help at Discharge: Family;Available 24 hours/day Type of Home: House Home Access: Stairs to enter [employer] of Steps: 1 step down and then 1 step up into home Home Layout: One level  Lives With: Spouse Prior Function Level of Independence: Independent with basic ADLs;Independent with gait;Requires assistive device for independence  Able to Take Stairs?: Yes Driving: Yes Vocation: Retired Comments: pt and family are very tight knit and "do everything together", likes to do yard work, go to [employer], church on sundays Vision/Perception     Cognition Overall Cognitive Status: Within Functional Limits for tasks assessed Arousal/Alertness: Awake/alert Orientation Level: Oriented X4 Attention: Alternating Alternating Attention: Appears intact Awareness: Appears intact Problem Solving: Appears  intact Safety/Judgment: Appears intact Sensation  Sensation Light Touch: Appears Intact Proprioception: Appears Intact Coordination Gross Motor Movements are Fluid and Coordinated: No Heel Shin Test: WFL RLE, NT LLE due to pain Motor  Motor Motor - Skilled Clinical Observations: generalized weakness, LLE limited due to pain  Mobility Bed Mobility Bed Mobility: Supine to Sit;Sit to Supine Supine to Sit: 4: Min assist;HOB flat;With rails Supine to Sit Details: Manual facilitation for placement;Verbal cues for technique;Verbal cues for precautions/safety;Visual cues/gestures for sequencing Supine to Sit Details (indicate cue type and reason): assist for LLE off of bed Sit to Supine: 4: Min assist;With rail;HOB flat Sit to Supine - Details: Verbal cues for technique;Visual cues/gestures for sequencing;Verbal cues for precautions/safety;Manual facilitation for placement Sit to Supine - Details (indicate cue type and reason): assist for LLE onto bed Transfers Transfers: Yes Stand Pivot Transfers: 4: Min assist Stand Pivot Transfer Details: Verbal cues for gait pattern;Verbal cues for precautions/safety;Verbal cues for technique;Visual cues/gestures for precautions/safety;Visual cues/gestures for sequencing;Verbal cues for safe use of DME/AE Locomotion  Ambulation Ambulation: Yes Ambulation/Gait Assistance: 4: Min guard Ambulation Distance (Feet): 70 Feet Assistive device: Rolling walker Gait Gait: Yes Gait Pattern: Impaired Gait Pattern: Antalgic;Decreased weight shift to left;Decreased stride length;Step-to pattern;Poor foot clearance - left Gait velocity: decreased Stairs / Additional Locomotion Stairs: Yes Stairs Assistance: 4: Min assist Stair Management Technique: Step to pattern;Forwards;Two rails Number of Stairs: 12 Height of Stairs: 3 (3" and 6" corner stairs) Ramp: Not tested (comment) (NT due to pain/fatigue) Curb: Not tested (comment) (NT due to  pain/fatigue) Architect: Yes Wheelchair Assistance: 5: Investment banker, operational Details: Verbal cues for technique;Verbal cues for precautions/safety;Verbal cues for Astronomer: Both upper extremities Wheelchair Parts Management: Needs assistance Distance: 150  Trunk/Postural Assessment  Cervical Assessment Cervical Assessment: Within Functional Limits Thoracic Assessment Thoracic Assessment: Within Functional Limits Lumbar Assessment Lumbar Assessment: Within Functional Limits Postural Control Postural Control: Deficits on evaluation (impaired stepping/righting reactions in standing due to pain and limited ROM LLE)  Balance Balance Balance Assessed: Yes Static Sitting Balance Static Sitting - Balance Support: No upper extremity supported;Feet supported Static Sitting - Level of Assistance: 6: Modified independent (Device/Increase time) Dynamic Sitting Balance Dynamic Sitting - Balance Support: Feet supported;No upper extremity supported Dynamic Sitting - Level of Assistance: 5: Stand by assistance Dynamic Sitting - Balance Activities: Lateral lean/weight shifting;Reaching across midline;Reaching for objects Static Standing Balance Static Standing - Balance Support: Bilateral upper extremity supported Static Standing - Level of Assistance: 5: Stand by assistance Dynamic Standing Balance Dynamic Standing - Balance Support: Bilateral upper extremity supported;During functional activity Dynamic Standing - Level of Assistance: 4: Min assist Dynamic Standing - Balance Activities: Lateral lean/weight shifting;Forward lean/weight shifting;Reaching for objects;Reaching across midline Extremity Assessment  RUE Assessment RUE Assessment: Within Functional Limits (ROM WFL, strength grossly 4/5) LUE Assessment LUE Assessment: Within Functional Limits (ROM WFL, strength grossly 4/5) RLE Assessment RLE Assessment: Exceptions to  [facility] (hip flexion 4-/5, knee extension/flexion 4/5, ankle dorsiflexion/plantarflexion 4/5) LLE Assessment LLE Assessment: Exceptions to [facility] (hip flexion 1/5 d/t pain, knee extesion 3/5, knee flexion 4-/5, ankle dorsiflexion 3/5, ankle plantarflexion 4/5)   See Function Navigator for Current Functional Status.   Refer to Care Plan for Long Term Goals  Recommendations for other services: Neuropsych  Discharge Criteria: Patient will be discharged from PT if patient refuses treatment 3 consecutive times without medical reason, if treatment goals not met, if there is a change in medical status, if patient makes no progress towards goals or if patient is discharged from  hospital.  The above assessment, treatment plan, treatment alternatives and goals were discussed and mutually agreed upon: by patient and by family  Luberta Mutter 05/30/2016, 2:39 PM

## 2016-05-30 NOTE — Procedures (Signed)
Tolerating hemodialysis without instability.  No hemodynamic issues, BFR 350cc/min. Thomas [name] C

## 2016-05-30 NOTE — Care Management Note (Signed)
Inpatient [place] Individual Statement of Services  Patient Name:  Thomas [name]  Date:  05/30/2016  Welcome to the [facility].  Our goal is to provide you with an individualized program based on your diagnosis and situation, designed to meet your specific needs.  With this comprehensive rehabilitation program, you will be expected to participate in at least 3 hours of rehabilitation therapies Monday-Friday, with modified therapy programming on the weekends.  Your rehabilitation program will include the following services:  Physical Therapy (PT), Occupational Therapy (OT), 24 hour per day rehabilitation nursing, Therapeutic Recreaction (TR), Neuropsychology, Case Management (Social Worker), Rehabilitation Medicine, Nutrition Services and Pharmacy Services  Weekly team conferences will be held on Wednesday to discuss your progress.  Your Social Worker will talk with you frequently to get your input and to update you on team discussions.  Team conferences with you and your family in attendance may also be held.  Expected length of stay: 8-12 days  Overall anticipated outcome: mod/i-supervision level  Depending on your progress and recovery, your program may change. Your Social Worker will coordinate services and will keep you informed of any changes. Your Social Worker's name and contact numbers are listed  below.  The following services may also be recommended but are not provided by the [place] will be made to provide these services after discharge if needed.  Arrangements include referral to agencies that provide these services.  Your insurance has been verified to be:  [employer] Advantage Your primary doctor is:  None  Pertinent information will be shared with your doctor and your insurance company.  Social Worker:   Ovidio Kin, [place] or ([phone]  Information discussed with and copy given to patient by: Elease Hashimoto, 05/30/2016, 9:56 AM

## 2016-05-30 NOTE — Consult Note (Addendum)
WOC wound consult note Reason for Consult: Consult requested for buttocks/sacrum. Pt states he frequently was incontinent of stool prior to admission to the rehab unit. Wound type: Bilat buttocks and inner gluteal fold with patchy areas of partial thickness skin loss related to moisture associated skin damage.  There is a partial thickness fissure to upper gluteal fold close to coccyx area related to moisture; 1X.1X.1cm, pink and moist.  This is NOT a pressure injury.  There are areas of dry scabs scattered around the gluteal fold which remove easily, revealing pink moist partial thickness wounds in a patchy area. Dressing procedure/placement/frequency: Barrier cream to protect and repel moisture.  Discussed plan of care with patient and family members at bedside. Please re-consult if further assistance is needed.  Thank-you,  [name] MSN, [place], [place], [place], [place]

## 2016-05-30 NOTE — Progress Notes (Signed)
05/30/2016 8:20 AM Hemodialysis Outpatient Note; Mr. Shehan will begin dialysis treatments at the [facility] on a Tuesday, Thursday and Saturday 2nd shift schedule, he will need to present himself on his first day of dialysis at 10:45AM to sign paperwork and consents. Waiting on confirmation of acceptance from Medical Director.Thank you. Gordy Savers

## 2016-05-30 NOTE — Progress Notes (Signed)
Initial Nutrition Assessment  DOCUMENTATION CODES:   Non-severe (moderate) malnutrition in context of chronic illness  INTERVENTION:  Downgrade diet to dysphagia 3 diet.   Provide nourishment snacks. (Ordered)  Family to provide snacks from outside.  Encourage adequate PO intake.  NUTRITION DIAGNOSIS:   Malnutrition related to chronic illness as evidenced by moderate depletion of body fat, severe depletion of muscle mass.  GOAL:   Patient will meet greater than or equal to 90% of their needs  MONITOR:   PO intake, Weight trends, Labs, I & O's, Skin  REASON FOR ASSESSMENT:   Consult Assessment of nutrition requirement/status  ASSESSMENT:   70 y.o. (no medical follow up) with onset of back pain March 2017 treated with NSAIDS and conservative care. He was admitted via ED 5/31 with nausea due to uremia, renal failure with BUN/Cr- 61/10.5 and anemia. Dr. Gabriel Footman consulted and HD initiated. Bone scan done due to complaints to severe back pain and showed multiple bony lesions suspicious for MM--largest in left iliac wing, L3 compression fracture and question L5 compression fracture, and left mid shaft lesion with thinning at risk for pathologic fracture. He underwent IM fixation of left femur on 06/4 by Dr. Lyla Glassing. WBAT LLE with spica wrap left hip. 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 ABLA treated with transfusion and thrombocytopenia being monitored.   Meal completion has been 85-100%. Pt reports appetite has been fine however pt is wanting to improve his nutrition status. Pt has tried Nepro shake, [employer], and Prostat, however reports disliking them all and not wanting any ordered for him while in Rehab. Family at bedside reports they will be bringing in food from outside (ex. Fruit, milkshakes). Family aware of the renal diet and reported they will make sure to bring in foods that will fall in line with the diet. Usual  body weight ~159 lbs. No significant weight loss per weight records. Family encouraged to bring in food from home/outside that patient would like to consume. Pt did report he was having trouble chewing his food at meals as pt reports his dentures did not fit well. RD to downgrade diet to dysphagia 3 diet.   Nutrition-Focused physical exam completed. Findings are moderate fat depletion, moderate to severe muscle depletion, and mild edema.   Labs and medications reviewed.   Diet Order:  Diet renal with fluid restriction Fluid restriction:: 1200 mL Fluid; Room service appropriate?: Yes; Fluid consistency:: Thin  Skin:  Wound (see comment) (Stage II pressure ulcer on coccyx)  Last BM:  6/16  Height:   Ht Readings from Last 1 Encounters:  05/29/16 6' 1" (1.854 m)    Weight:   Wt Readings from Last 1 Encounters:  05/28/16 154 lb 9 oz (70.11 kg)    Ideal Body Weight:  83.6 kg  BMI:  There is no weight on file to calculate BMI.  Estimated Nutritional Needs:   Kcal:  2050-2250  Protein:  100-110 grams  Fluid:  1.2 L/day  EDUCATION NEEDS:   Education needs addressed  Corrin Parker, MS, RD, LDN Pager # [phone] After hours/ weekend pager # [phone]

## 2016-05-30 NOTE — Progress Notes (Signed)
Social Work  Social Work Assessment and Plan  Patient Details  Name: Thomas [name] MRN: [id number] Date of Birth: [birth year]  Today's Date: 05/30/2016  Problem List:  Patient Active Problem List   Diagnosis Date Noted  . 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 Physical deconditioning   . Closed fracture of shaft of left femur ([place])   . Adjustment disorder with mixed anxiety and depressed mood   . Postoperative pain   . Bilateral low back pain without sciatica   . Pressure ulcer   . Thrombocytopenia ([place])   . Arterial hypotension   . Functional diarrhea   . Anemia of chronic disease   . Multiple myeloma not having achieved remission ([place])   . Antineoplastic chemotherapy induced pancytopenia ([place])   . Low back pain   . Post-op pain   . Surgery, elective   . Acute blood loss anemia   . ESRD on dialysis ([place])   . Essential hypertension   . Urinary tract infection, site not specified   . Vitamin B12 deficiency   . Multiple myeloma ([place]) 05/14/2016  . Acute renal failure ([place])   . 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 Anemia due to bone marrow failure ([place])   . 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 Occult blood positive stool 05/10/2016  . Malnutrition of moderate degree 05/10/2016   Past Medical History:  Past Medical History  Diagnosis Date  . Compression fracture   . Chronic kidney disease   . Cancer [facility])    Past Surgical History:  Past Surgical History  Procedure Laterality Date  . Mouth surgery    . Femur im nail Left 05/14/2016    Procedure: INTRAMEDULLARY (IM) NAIL FEMORAL;  Surgeon: Rod Can, MD;  Location: [place];  Service: Orthopedics;  Laterality: Left;  . Bone biopsy Left 05/14/2016    Procedure: LEFT FEMORAL BIOPSY WITH INTRAOPERATIVE FROZEN SECTIONS ;  Surgeon: Rod Can, MD;  Location: [place];  Service: Orthopedics;  Laterality: Left;  . Av fistula placement Left 05/24/2016    Procedure: ARTERIOVENOUS (AV) FISTULA  CREATION-LEFT;  Surgeon: Conrad [place], MD;  Location: [place];  Service: Vascular;  Laterality: Left;   Social History:  reports that he has quit smoking. He does not have any smokeless tobacco history on file. He reports that he does not drink alcohol or use illicit drugs.  Family / Support Systems Marital Status: Married Patient Roles: Spouse, Parent Spouse/Significant Other: Thomas [name] [phone]  [phone] Children: Thomas [name] [phone]-cell off for the summer, lives behind pt and wife Other Supports: Thomas [name]-daughter [phone] Anticipated Caregiver: Wife and Thomas [name] Ability/Limitations of Caregiver: Wife is in good health and daughter is off for the summer and lives behind them Caregiver Availability: 24/7 Family Dynamics: Close knit family who are there for one another and very involved with one another. Pt feels very grateful to have his family and friends support him. He has never been through anything like this and it is over whelming all of this. Wife and daughter are here daily to support him.  Social History Preferred language: English Religion:  Cultural Background: No issues Education: High School Read: Yes Write: Yes Employment Status: Retired Freight forwarder Issues: No issues Guardian/Conservator: none-according to MD pt is capable of making his own decisions while here. Wife is here often so will make sure she is included also.   Abuse/Neglect Physical Abuse: Denies Verbal Abuse: Denies Sexual Abuse: Denies Exploitation of patient/patient's resources:  Denies Self-Neglect: Denies  Emotional Status Pt's affect, behavior adn adjustment status: Pt is motivated to improve and regain his strength so he can be ready for the next step of his cancer treatment. He is hoping his kidneys will begin working and he will not need HD but is ok if he does. He is relying upon his faith and family to get him thorugh this. Recent Psychosocial Issues:  thought he was healthy until his back pain started-last March.  Pyschiatric History: No history deferred depression screen at this time due to coping appropriately. But do feel he would benefit from neruo-psych seeign while here due to all that has occurred while here and his diagnoses. He does rely upon his faith and feels God is in control. Substance Abuse History: No issues  Patient / Family Perceptions, Expectations & Goals Pt/Family understanding of illness & functional limitations: Pt and family have spoken with the multiple MD's involved and feel they have the best understanding they can have at this time. He does ask questions and feels the MD's have answered them to the best of their knowledge. He wants to know the information they know so he feels infoemed. Premorbid pt/family roles/activities: Husband, Father, Merchant navy officer, retiree, Church member, Health visitor, friend, etc Anticipated changes in roles/activities/participation: resume Pt/family expectations/goals: Pt states: " I want to do all I can to get stronger and get myself in remission."  Wife states: " I will do whatever he needs, but hope the chemo works."  Daughter states: " I will help do whaterver they both need, they have always been there for me."  [employer]: None Premorbid Home Care/DME Agencies: None Transportation available at discharge: [employer] referrals recommended: Neuropsychology, Support group (specify)  Discharge Planning Living Arrangements: Spouse/significant other Support Systems: Spouse/significant other, Children, Other relatives, Water engineer, Social worker community Type of Residence: Private residence Insurance underwriter Resources: Multimedia programmer (specify) ([employer]) Financial Resources: Social Security Financial Screen Referred: No Living Expenses: Own Money Management: Patient, Spouse Does the patient have any problems obtaining your medications?: No Home  Management: Wife does pt would do the outdoor work Patient/Family Preliminary Plans: Return home with wife and daughter can assist off for the summer from [employer]. They have another daughter who works but will help also. Pt will have 24 hr care at discharge. Pt wants to be able to do as much as he can for himself before he leaves. Social Work Anticipated Follow Up Needs: HH/OP, Support Group  Clinical Impression Pleasant gentleman who has faced multiple health issues-cancer, fractured femur and kidneys failing, with grace and dignity. He is willing to do what he can to regain his strength and fight his cancer. His family is very involved and supportive and will assist him at discharge. They plan to be here daily to support him and watch his progress. Will work on discharge plans and feel he would benefit form seeing neuro-psych while here. Will also work on obtaining a PCP for pt prior to discharge.  Elease Hashimoto 05/30/2016, 10:47 AM

## 2016-05-30 NOTE — Evaluation (Addendum)
Occupational Therapy Assessment and Plan  Patient Details  Name: Thomas [name] MRN: [id number] Date of Birth: [birth year]  OT Diagnosis: acute pain, lumbago (low back pain) and muscle weakness (generalized) Rehab Potential: Rehab Potential (ACUTE ONLY): Good ELOS: 8-12 days   Today's Date: 05/30/2016 OT Individual Time: 0900-1003 OT Individual Time Calculation (min): 63 min     Problem List:  Patient Active Problem List   Diagnosis Date Noted  . 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 Physical deconditioning   . Closed fracture of shaft of left femur ([place])   . Adjustment disorder with mixed anxiety and depressed mood   . Postoperative pain   . Bilateral low back pain without sciatica   . Pressure ulcer   . Thrombocytopenia ([place])   . Arterial hypotension   . Functional diarrhea   . Anemia of chronic disease   . Multiple myeloma not having achieved remission ([place])   . Antineoplastic chemotherapy induced pancytopenia ([place])   . Low back pain   . Post-op pain   . Surgery, elective   . Acute blood loss anemia   . ESRD on dialysis ([place])   . Essential hypertension   . Urinary tract infection, site not specified   . Vitamin B12 deficiency   . Multiple myeloma ([place]) 05/14/2016  . Acute renal failure ([place])   . Pathological fracture of left femur due to neoplastic disease ([place])   . Anemia due to bone marrow failure ([place])   . 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 Occult blood positive stool 05/10/2016  . Malnutrition of moderate degree 05/10/2016    Past Medical History:  Past Medical History  Diagnosis Date  . Compression fracture   . Chronic kidney disease   . Cancer [facility])    Past Surgical History:  Past Surgical History  Procedure Laterality Date  . Mouth surgery    . Femur im nail Left 05/14/2016    Procedure: INTRAMEDULLARY (IM) NAIL FEMORAL;  Surgeon: Rod Can, MD;  Location: [place];  Service: Orthopedics;  Laterality: Left;  . Bone biopsy Left 05/14/2016     Procedure: LEFT FEMORAL BIOPSY WITH INTRAOPERATIVE FROZEN SECTIONS ;  Surgeon: Rod Can, MD;  Location: [place];  Service: Orthopedics;  Laterality: Left;  . Av fistula placement Left 05/24/2016    Procedure: ARTERIOVENOUS (AV) FISTULA CREATION-LEFT;  Surgeon: Conrad [place], MD;  Location: [place];  Service: Vascular;  Laterality: Left;    Assessment & Plan Clinical Impression: Patient is a 70 y.o. with onset of back pain March 2017 treated with NSAIDS and conservative care. He was admitted via ED 5/31 with nausea due to uremia, renal failure with BUN/Cr- 61/10.5 and anemia. Dr. Janthony Footman consulted and HD initiated. Bone scan done due to complaints to severe back pain and showed multiple bony lesions suspicious for MM--largest in left iliac wing, L3 compression fracture and question L5 compression fracture, and left mid shaft lesion with thinning at risk for pathologic fracture. He underwent IM fixation of left femur on 06/4 by Dr. Lyla Glassing. WBAT LLE with spica wrap left hip. 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 ABLA treated with transfusion and thrombocytopenia being monitored.   Dr. Tammi Klippel consulted and recommended palliative XRT to spine and left femur. Dr. Alvy Bimler consulted for treatment options and patient started on Velcade injections days 1,4,8 and 11 on 06/8. To have weekly pulse decadron 40 mg in conjunction with injections. L-AVF placed by Dr. Bridgett Larsson on 6/14 and  HD ongoing on TTS. Blood pressures on low side but fluid overload noted and low grade fevers noted on 06/15. 2 D echo with EF 60%-65% and no wall abnormality.   Patient transferred to CIR on 05/29/2016 .    Patient currently requires mod with basic self-care skills secondary to muscle weakness and decreased standing balance and decreased balance strategies.  Prior to hospitalization, patient could complete ADLs with modified independent .  Patient will benefit from skilled  intervention to decrease level of assist with basic self-care skills and increase independence with basic self-care skills prior to discharge home with care partner.  Anticipate patient will require intermittent supervision and follow up home health.  OT - End of Session Activity Tolerance: Tolerates 30+ min activity with multiple rests Endurance Deficit: Yes Endurance Deficit Description: requires multiple rest breaks OT Assessment Rehab Potential (ACUTE ONLY): Good OT Patient demonstrates impairments in the following area(s): Balance;Endurance;Motor;Pain;Safety;Skin Integrity OT Basic ADL's Functional Problem(s): Grooming;Bathing;Dressing;Toileting OT Transfers Functional Problem(s): Toilet;Tub/Shower OT Additional Impairment(s): None OT Plan OT Intensity: Minimum of 1-2 x/day, 45 to 90 minutes OT Frequency: 5 out of 7 days OT Duration/Estimated Length of Stay: 8-12 days OT Treatment/Interventions: Balance/vestibular training;Community reintegration;Discharge planning;Disease mangement/prevention;DME/adaptive equipment instruction;Functional mobility training;Pain management;Patient/family education;Psychosocial support;Self Care/advanced ADL retraining;Skin care/wound managment;Therapeutic Activities;Therapeutic Exercise;UE/LE Strength taining/ROM;UE/LE Coordination activities;Wheelchair propulsion/positioning OT Self Feeding Anticipated Outcome(s): no goal OT Basic Self-Care Anticipated Outcome(s): Mod I - supervision OT Toileting Anticipated Outcome(s): Mod I OT Bathroom Transfers Anticipated Outcome(s): Mod I - supervision OT Recommendation Patient destination: Home Follow Up Recommendations: Home health OT Equipment Recommended: None recommended by OT Equipment Details: pt has all equipment   Skilled Therapeutic Intervention OT eval with discussion of rehab process, OT purpose, POC, ELOS, and goals.  ADL assessment with focus on functional mobility, sit > stand, and standing  tolerance.  Educated pt on bed mobility and sidelying to sitting to increase independence with bed mobility while therapist assisted with positioning of LLE.  Mod assist sit > stand from low surface, able to come up into standing from elevated height of bed with RW and contact guard.  Educated on hemi-dressing technique with focus on threading LLE first with LB dressing and standing balance while pulling pants over hips.  Pt would benefit from education on AE to increase independence with LB dressing.  OT Evaluation Precautions/Restrictions  Precautions Precautions: Fall Restrictions Weight Bearing Restrictions: Yes LLE Weight Bearing: Weight bearing as tolerated Pain Pain Assessment Pain Assessment: 0-10 Pain Score: 5  Pain Type: Surgical pain Pain Location: Leg Pain Orientation: Left Pain Descriptors / Indicators: Aching Pain Frequency: Occasional Pain Onset: With Activity Patients Stated Pain Goal: 2 Pain Intervention(s): Medication (See eMAR);Repositioned Multiple Pain Sites: No Home Living/Prior [place] expects to be discharged to:: Private residence Living Arrangements: Spouse/significant other, Children Available Help at Discharge: Family, Available 24 hours/day Type of Home: House Home Access: Stairs to enter [employer] of Steps: 1 step down and then 1 step up into home Home Layout: One level  Lives With: Spouse IADL History Homemaking Responsibilities: No Prior Function Level of Independence: Independent with basic ADLs, Independent with gait, Requires assistive device for independence (Prior to March 2017, has been using a cane since)  Able to Take Stairs?: Yes Vocation: Retired Leisure: Hobbies-yes (Comment) (yard work) Comments: pt and family are very tight knit and "do everything together" ADL  See Function navigator Vision/Perception  Vision- History Baseline Vision/History: Wears glasses Wears Glasses: At all  times Patient Visual Report: No  change from baseline Vision- Assessment Vision Assessment?: No apparent visual deficits  Cognition Overall Cognitive Status: Within Functional Limits for tasks assessed Arousal/Alertness: Awake/alert Orientation Level: Person;Place;Situation Person: Oriented Place: Oriented Situation: Oriented Year: 2017 Month: June Day of Week: Incorrect (Monday, correctly named date 62) Memory: Appears intact Immediate Memory Recall: Sock;Blue;Bed Memory Recall: Sock;Blue;Bed Memory Recall Sock: Without Cue Memory Recall Blue: Without Cue Memory Recall Bed: Without Cue Attention: Alternating Awareness: Appears intact Problem Solving: Appears intact Safety/Judgment: Appears intact Sensation Sensation Light Touch: Appears Intact Proprioception: Appears Intact Coordination Gross Motor Movements are Fluid and Coordinated: No Fine Motor Movements are Fluid and Coordinated: Yes Heel Shin Test: WFL RLE, NT LLE due to pain Motor  Motor Motor - Skilled Clinical Observations: generalized weakness, LLE limited due to pain Mobility  Bed Mobility Bed Mobility: Supine to Sit;Sit to Supine Supine to Sit: 4: Min assist;HOB flat;With rails Supine to Sit Details: Manual facilitation for placement;Verbal cues for technique;Verbal cues for precautions/safety;Visual cues/gestures for sequencing Supine to Sit Details (indicate cue type and reason): assist for LLE off of bed Sit to Supine: 4: Min assist;With rail;HOB flat Sit to Supine - Details: Verbal cues for technique;Visual cues/gestures for sequencing;Verbal cues for precautions/safety;Manual facilitation for placement Sit to Supine - Details (indicate cue type and reason): assist for LLE onto bed  Trunk/Postural Assessment  Cervical Assessment Cervical Assessment: Within Functional Limits Thoracic Assessment Thoracic Assessment: Within Functional Limits Lumbar Assessment Lumbar Assessment: Within Functional  Limits Postural Control Postural Control: Deficits on evaluation (impaired stepping/righting reactions in standing due to pain and limited ROM LLE)  Balance Balance Balance Assessed: Yes Static Sitting Balance Static Sitting - Balance Support: No upper extremity supported;Feet supported Static Sitting - Level of Assistance: 6: Modified independent (Device/Increase time) Dynamic Sitting Balance Dynamic Sitting - Balance Support: Feet supported;No upper extremity supported Dynamic Sitting - Level of Assistance: 5: Stand by assistance Dynamic Sitting - Balance Activities: Lateral lean/weight shifting;Reaching across midline;Reaching for objects Static Standing Balance Static Standing - Balance Support: Bilateral upper extremity supported Static Standing - Level of Assistance: 5: Stand by assistance Dynamic Standing Balance Dynamic Standing - Balance Support: Bilateral upper extremity supported;During functional activity Dynamic Standing - Level of Assistance: 4: Min assist Dynamic Standing - Balance Activities: Lateral lean/weight shifting;Forward lean/weight shifting;Reaching for objects;Reaching across midline Extremity/Trunk Assessment RUE Assessment RUE Assessment: Within Functional Limits (ROM WFL, strength grossly 4/5) LUE Assessment LUE Assessment: Within Functional Limits (ROM WFL, strength grossly 4/5)   See Function Navigator for Current Functional Status.   Refer to Care Plan for Long Term Goals  Recommendations for other services: None  Discharge Criteria: Patient will be discharged from OT if patient refuses treatment 3 consecutive times without medical reason, if treatment goals not met, if there is a change in medical status, if patient makes no progress towards goals or if patient is discharged from hospital.  The above assessment, treatment plan, treatment alternatives and goals were discussed and mutually agreed upon: by patient and by family  Ellwood Dense  [facility] 05/30/2016, 3:18 PM

## 2016-05-30 NOTE — Progress Notes (Signed)
Patient information reviewed and entered into eRehab system by Haywood Meinders, RN, CRRN, PPS Coordinator.  Information including medical coding and functional independence measure will be reviewed and updated through discharge.     Per nursing patient was given "Data Collection Information Summary for Patients in Inpatient Rehabilitation Facilities with attached "Privacy Act Statement-Health Care Records" upon admission.  

## 2016-05-30 NOTE — Progress Notes (Signed)
70 y.o. (no medical follow up) with onset of back pain March 2017 treated with NSAIDS and conservative care. He was admitted via ED 5/31 with nausea due to uremia, renal failure with BUN/Cr- 61/10.5 and anemia. Dr. Lola Footman consulted and HD initiated. Bone scan done due to complaints to severe back pain and showed multiple bony lesions suspicious for MM--largest in left iliac wing, L3 compression fracture and question L5 compression fracture, and left mid shaft lesion with thinning at risk for pathologic fracture. He underwent IM fixation of left femur on 06/4 by Dr. Lyla Glassing. WBAT LLE with spica wrap left hip. 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 ABLA treated with transfusion and thrombocytopenia being monitored.   Dr. Tammi Klippel consulted and recommended palliative XRT to spine and left femur. Dr. Alvy Bimler consulted for treatment options and patient started on Velcade injections days 1,4,8 and 11 on 06/8. To have weekly pulse decadron 40 mg in conjunction with injections. L-AVF placed by Dr. Bridgett Larsson on 6/14 and HD ongoing on TTS. Blood pressures on low side but fluid overload noted and low grade fevers noted on 06/15. 2 D echo with EF 60%-65% and no wall abnormality  Subjective/Complaints:   Objective: Vital Signs: Blood pressure 142/60, pulse 73, temperature 97.7 F (36.5 C), temperature source Oral, resp. rate 18, height 6' 1" (1.854 m), SpO2 99 %. No results found. Results for orders placed or performed during the hospital encounter of 05/29/16 (from the past 72 hour(s))  CBC     Status: Abnormal   Collection Time: 05/30/16  5:18 AM  Result Value Ref Range   WBC 3.1 (L) 4.0 - 10.5 K/uL   RBC 3.03 (L) 4.22 - 5.81 MIL/uL   Hemoglobin 8.8 (L) 13.0 - 17.0 g/dL   HCT 27.3 (L) 39.0 - 52.0 %   MCV 90.1 78.0 - 100.0 fL   MCH 29.0 26.0 - 34.0 pg   MCHC 32.2 30.0 - 36.0 g/dL   RDW 14.9 11.5 - 15.5 %   Platelets 55 (L) 150 - 400 K/uL    Comment:  CONSISTENT WITH PREVIOUS RESULT     HEENT: edentulous  General: No acute distress Mood and affect are appropriate Heart: Regular rate and rhythm no rubs murmurs or extra sounds Lungs: Clear to auscultation, breathing unlabored, no rales or wheezes Abdomen: Positive bowel sounds, soft nontender to palpation, nondistended Extremities: No clubbing, cyanosis, or edema Skin: No evidence of breakdown, no evidence of rash Neurologic: Cranial nerves II through XII intact, motor strength is 5/5 in bilateral deltoid, , tricep, grip, 4 minus left biceps 5 right bicep, 5/5 righthip flexor, knee extensors, ankle dorsiflexor and plantar flexor, 2 minus left hip flexor, knee extensor, 4 ankle dorsiflexor plantar flexor Sensory exam normal sensation to light touch and proprioception in bilateral upper and lower extremities Cerebellar exam normal finger to nose to finger as well as heel to shin in bilateral upper and lower extremities Musculoskeletal: Full range of motion in all 4 extremities. Ecchymosis and swelling at left hip  Assessment/Plan: 1. Functional deficits secondary to left femur pathological fracture status post IM nail in a patient with newly diagnosed multiple myeloma which require 3+ hours per day of interdisciplinary therapy in a comprehensive inpatient rehab setting. Physiatrist is providing close team supervision and 24 hour management of active medical problems listed below. Physiatrist and rehab team continue to assess barriers to discharge/monitor patient progress toward functional and medical goals. FIM:  Medical Problem List and Plan: 1.  Debilitation with decreased mobility secondary to Multiple myeloma/left femur pathologic fracture, initiate PTOT CIR level 2.  DVT Prophylaxis/Anticoagulation: Subcutaneous heparin. Monitor platelet counts and any signs of bleeding 3. Pain Management: (Chronic and post-op) Hydrocodone as needed,  discussed palliative radiation with radiation oncology physician assistant, this would not be occurring until about 2 weeks from now if needed. 4. Mood: Xanax 0.25 mg twice daily as needed 5. Neuropsych: This patient is capable of making decisions on his on behalf. 6. Skin/Wound Care: Monitor sacral decub--foam dressing for now. Routine pressure relief measures. Nephro between  Meals.    7. Fluids/Electrolytes/Nutrition:  Monitor I/O. Renal diet with 1200 ccFR., discussed that fruits and vegetables can be brought in from home 8. Multiple myeloma: Started on Velcade 06/08--follow-up per her oncology services. Weekly pulse dose dexamethasone. 9. ESRD: Strict I/O. 1200 cc/FR.   10. Volume overload/Hypotension:  Monitor for now.   11. Thrombocytopenia: Progressive drop--Monitor with serial checks and for signs of bleeding.   12. Vitamin B12 deficiency: on supplement 13. Left femur pending pathologic fracture s/p IM nailing; WBAT.  14. Diarrhea: due to multiple laxatives. Will discontinue mirlax and change colace and senna to prn.   15. Acute on chronic anemia: Monitor. Transfuse if necessary 16. HTN: Monitor and provide prns in accordance with increased physical exertion and pain   LOS (Days) 1 A FACE TO FACE EVALUATION WAS PERFORMED  KIRSTEINS,ANDREW E 05/30/2016, 8:56 AM    

## 2016-05-30 NOTE — Progress Notes (Signed)
Occupational Therapy Session Note  Patient Details  Name: Thomas [name] MRN: [id number] Date of Birth: 10-11-1946  Today's Date: 05/30/2016 OT Individual Time: 1105-1135 OT Individual Time Calculation (min): 30 min   Skilled Therapeutic Interventions/Progress Updates:  Patient found seated in recliner with family (wife and daughter) present. Pt with no direct pain, but some complaints during movement of LLE, monitored this during session. Pt used lever to lower legs in recliner, therapist holding LLE to decrease pain. Pt stood with RW and ambulated into BR. Pt transferred onto [facility] seated over toilet seat. During ambulation pt with some complaints of dizziness, when pt seated on BSC checked BP=172/74 and pt with some complaints of being "hot" and with feelings of nausea. Pt spit up some clear mucus and stated he was ok after that. From here, pt ambulated back to recliner and therapist left pt seated in recliner with all needs within reach. Encouraged pt to call for assistance and ambulate to BR prn with staff and RW, discussed importance of family members not assisting at this time. Discussed patient's current level of function with nursing staff as well as pt's complaints during session and patient's BP reading.   Therapy Documentation Precautions:  Precautions Precautions: Fall Restrictions Weight Bearing Restrictions: Yes LLE Weight Bearing: Weight bearing as tolerated  Vital Signs: Therapy Vitals Temp: 97.7 F (36.5 C) Temp Source: Oral Pulse Rate: 73 Resp: 18 BP: (!) 142/60 mmHg Patient Position (if appropriate): Lying Oxygen Therapy SpO2: 99 % O2 Device: Not Delivered  See Function Navigator for Current Functional Status.  Therapy/Group: Individual Therapy  Chrys Racer , MS, OTR/L, CLT  05/30/2016, 1:01 PM

## 2016-05-31 ENCOUNTER — Inpatient Hospital Stay (HOSPITAL_COMMUNITY): Payer: PPO | Admitting: Physical Therapy

## 2016-05-31 ENCOUNTER — Encounter (HOSPITAL_COMMUNITY): Payer: Self-pay

## 2016-05-31 ENCOUNTER — Inpatient Hospital Stay (HOSPITAL_COMMUNITY): Payer: PPO | Admitting: Occupational Therapy

## 2016-05-31 MED ORDER — POLYETHYLENE GLYCOL 3350 17 G PO PACK
17.0000 g | PACK | Freq: Every day | ORAL | Status: DC
  Administered 2016-05-31 – 2016-06-07 (×7): 17 g via ORAL
  Filled 2016-05-31 (×7): qty 1

## 2016-05-31 MED ORDER — SENNOSIDES-DOCUSATE SODIUM 8.6-50 MG PO TABS
2.0000 | ORAL_TABLET | Freq: Every day | ORAL | Status: DC
  Administered 2016-05-31 – 2016-06-07 (×8): 2 via ORAL
  Filled 2016-05-31 (×8): qty 2

## 2016-05-31 NOTE — Progress Notes (Signed)
Occupational Therapy Note  Patient Details  Name: Thomas [name] MRN: [id number] Date of Birth: 04-25-1946  Today's Date: 05/31/2016 OT Missed Time: 31 Minutes Missed Time Reason: Patient fatigue  Pt missed 45 mins scheduled OT treatment session secondary to fatigue and nausea.  Pt asleep upon arrival to which pt's family members report that he has been sick with nausea and vomiting majority of day and just was able to keep some crackers down and fell asleep.  Family requested that therapist not wake pt.  Will continue to follow per POC.   Simonne Come 05/31/2016, 3:03 PM

## 2016-05-31 NOTE — Progress Notes (Signed)
Subjective/Complaints:  Complains of nausea and vomiting. No large volume vomiting. No hematochezia. The patient spit up a small amount during conversation. Patient required suppository as well as laxatives prior to rehabilitation stay while on acute care No abdominal pain. No BM 6 days.  Review of systems denies any chest pain no shortness of breath, no urinary symptoms Objective: Vital Signs: Blood pressure 131/63, pulse 80, temperature 98.5 F (36.9 C), temperature source Oral, resp. rate 18, height 6' 1"  (1.854 m), weight 72.8 kg (160 lb 7.9 oz), SpO2 100 %. No results found. Results for orders placed or performed during the hospital encounter of 05/29/16 (from the past 72 hour(s))  CBC     Status: Abnormal   Collection Time: 05/30/16  5:18 AM  Result Value Ref Range   WBC 3.1 (L) 4.0 - 10.5 K/uL   RBC 3.03 (L) 4.22 - 5.81 MIL/uL   Hemoglobin 8.8 (L) 13.0 - 17.0 g/dL   HCT 27.3 (L) 39.0 - 52.0 %   MCV 90.1 78.0 - 100.0 fL   MCH 29.0 26.0 - 34.0 pg   MCHC 32.2 30.0 - 36.0 g/dL   RDW 14.9 11.5 - 15.5 %   Platelets 55 (L) 150 - 400 K/uL    Comment: CONSISTENT WITH PREVIOUS RESULT  Renal function panel     Status: Abnormal   Collection Time: 05/30/16  4:14 PM  Result Value Ref Range   Sodium 130 (L) 135 - 145 mmol/L   Potassium 4.1 3.5 - 5.1 mmol/L   Chloride 95 (L) 101 - 111 mmol/L   CO2 26 22 - 32 mmol/L   Glucose, Bld 158 (H) 65 - 99 mg/dL   BUN 46 (H) 6 - 20 mg/dL   Creatinine, Ser 6.09 (H) 0.61 - 1.24 mg/dL   Calcium 8.1 (L) 8.9 - 10.3 mg/dL   Phosphorus 4.0 2.5 - 4.6 mg/dL   Albumin 2.2 (L) 3.5 - 5.0 g/dL   GFR calc non Af Amer 8 (L) >60 mL/min   GFR calc Af Amer 10 (L) >60 mL/min    Comment: (NOTE) The eGFR has been calculated using the CKD EPI equation. This calculation has not been validated in all clinical situations. eGFR's persistently <60 mL/min signify possible Chronic Kidney Disease.    Anion gap 9 5 - 15     HEENT: edentulous  General: No acute  distress Mood and affect are appropriate Heart: Regular rate and rhythm no rubs murmurs or extra sounds Lungs: Clear to auscultation, breathing unlabored, no rales or wheezes Abdomen: Positive bowel sounds, soft nontender to palpation, nondistended Extremities: No clubbing, cyanosis, or edema Skin: No evidence of breakdown, no evidence of rash Neurologic: Cranial nerves II through XII intact, motor strength is 5/5 in bilateral deltoid, , tricep, grip, 4 minus left biceps 5 right bicep, 5/5 righthip flexor, knee extensors, ankle dorsiflexor and plantar flexor, 2 minus left hip flexor, knee extensor, 4 ankle dorsiflexor plantar flexor Sensory exam normal sensation to light touch and proprioception in bilateral upper and lower extremities Cerebellar exam normal finger to nose to finger as well as heel to shin in bilateral upper and lower extremities Musculoskeletal: Full range of motion in all 4 extremities. Ecchymosis and swelling at left hip  Assessment/Plan: 1. Functional deficits secondary to left femur pathological fracture status post IM nail in a patient with newly diagnosed multiple myeloma which require 3+ hours per day of interdisciplinary therapy in a comprehensive inpatient rehab setting. Physiatrist is providing close team supervision and 24  hour management of active medical problems listed below. Physiatrist and rehab team continue to assess barriers to discharge/monitor patient progress toward functional and medical goals. FIM: Function - Bathing Position: Sitting EOB Body parts bathed by patient: Right arm, Left arm, Chest, Abdomen Body parts bathed by helper: Front perineal area, Buttocks, Back Bathing not applicable: Right upper leg, Left upper leg, Right lower leg, Left lower leg Assist Level: Touching or steadying assistance(Pt > 75%) (Max assist)  Function- Upper Body Dressing/Undressing What is the patient wearing?: Button up shirt Button up shirt - Perfomed by patient:  Thread/unthread left sleeve, Pull shirt around back, Button/unbutton shirt Button up shirt - Perfomed by helper: Thread/unthread right sleeve Assist Level: Touching or steadying assistance(Pt > 75%) Function - Lower Body Dressing/Undressing What is the patient wearing?: Underwear, Pants, Shoes, Ted Hose Position: Sitting EOB Underwear - Performed by patient: Thread/unthread right underwear leg, Pull underwear up/down Underwear - Performed by helper: Thread/unthread left underwear leg Pants- Performed by patient: Thread/unthread right pants leg, Pull pants up/down Pants- Performed by helper: Thread/unthread left pants leg Shoes - Performed by helper: Don/doff right shoe, Don/doff left shoe TED Hose - Performed by helper: Don/doff right TED hose, Don/doff left TED hose Assist for footwear: Maximal assist Assist for lower body dressing: Touching or steadying assistance (Pt > 75%) (Mod assist)        Function - Chair/bed transfer Chair/bed transfer method: Ambulatory Chair/bed transfer assist level: Touching or steadying assistance (Pt > 75%) Chair/bed transfer assistive device: Walker, Armrests Chair/bed transfer details: Manual facilitation for weight shifting, Tactile cues for weight shifting, Tactile cues for posture, Tactile cues for placement, Tactile cues for sequencing, Verbal cues for sequencing, Verbal cues for technique  Function - Locomotion: Wheelchair Will patient use wheelchair at discharge?: No Max wheelchair distance: 150 Assist Level: Supervision or verbal cues Assist Level: Supervision or verbal cues Assist Level: Supervision or verbal cues Turns around,maneuvers to table,bed, and toilet,negotiates 3% grade,maneuvers on rugs and over doorsills: No Function - Locomotion: Ambulation Assistive device: Walker-rolling Max distance: 75 Assist level: Touching or steadying assistance (Pt > 75%) Assist level: Touching or steadying assistance (Pt > 75%) Assist level:  Touching or steadying assistance (Pt > 75%) Walk 150 feet activity did not occur: Safety/medical concerns Walk 10 feet on uneven surfaces activity did not occur: Safety/medical concerns  Function - Comprehension Comprehension: Auditory Comprehension assist level: Follows complex conversation/direction with extra time/assistive device  Function - Expression Expression: Verbal Expression assist level: Expresses complex ideas: With extra time/assistive device  Function - Social Interaction Social Interaction assist level: Interacts appropriately with others with medication or extra time (anti-anxiety, antidepressant).  Function - Problem Solving Problem solving assist level: Solves complex 90% of the time/cues < 10% of the time  Function - Memory Memory assist level: Assistive device: No helper Patient normally able to recall (first 3 days only): Current season, Location of own room, Staff names and faces, That he or she is in a hospital  Medical Problem List and Plan: 1.  Debilitation with decreased mobility secondary to Multiple myeloma/left femur pathologic fracture, Team conference today please see physician documentation under team conference tab, met with team face-to-face to discuss problems,progress, and goals. Formulized individual treatment plan based on medical history, underlying problem and comorbidities. 2.  DVT Prophylaxis/Anticoagulation: Subcutaneous heparin. Monitor platelet counts and any signs of bleeding 3. Pain Management: (Chronic and post-op) Hydrocodone as needed, discussed palliative radiation with radiation oncology physician assistant, this would not be occurring until about 2  weeks from now if needed. 4. Mood: Xanax 0.25 mg twice daily as needed 5. Neuropsych: This patient is capable of making decisions on his on behalf. 6. Skin/Wound Care: Monitor sacral decub--foam dressing for now. Routine pressure relief measures. Nephro between  Meals.    7.  Fluids/Electrolytes/Nutrition:  Monitor I/O. Renal diet with 1200 ccFR., discussed that fruits and vegetables can be brought in from home 8. Multiple myeloma: Started on Velcade 06/08--follow-up per her oncology services. Weekly pulse dose dexamethasone. 9. ESRD: Strict I/O. 1200 cc/FR.   10. Volume overload/Hypotension:  Monitor for now.   11. Thrombocytopenia: Progressive drop--Monitor with serial checks and for signs of bleeding.   12. Vitamin B12 deficiency: on supplement 13. Left femur pending pathologic fracture s/p IM nailing; WBAT.  14. Diarrhea: Resolved now constipated. Will Resume miralax and change colace and senna to Daily   15. Acute on chronic anemia: Monitor. Transfuse if necessary 16. HTN: Monitor and provide prns in accordance with increased physical exertion and pain 17 nausea and vomiting. Has received Velcade 2 days ago,  Also no BM 6 days. Will need laxative.No clinical signs of ileus We'll use  When necessary IV Zofran  LOS (Days) 2 A FACE TO FACE EVALUATION WAS PERFORMED  KIRSTEINS,ANDREW E 05/31/2016, 8:56 AM

## 2016-05-31 NOTE — Progress Notes (Signed)
Social Work Patient ID: Thomas [name], male   DOB: 1946-12-05, 70 y.o.   MRN: [id number] Met with pt, wife and daughter to discuss team conference goals-supervision level and target discharge 6/29. Pt is having a bad day and nauseous. He is hoping once his constipation is resolved he will feel better and be able to do therapies. Wife is glad to have a target discharge date, she has been attending Therapies with him yesterday and today. Will work toward discharge needs.

## 2016-05-31 NOTE — Patient Care Conference (Signed)
Inpatient RehabilitationTeam Conference and Plan of Care Update Date: 05/31/2016   Time: 10:45 Am    Patient Name: Thomas [name]      Medical Record Number: [id number]  Date of Birth: 08-Nov-1946 Sex: Male         Room/Bed: 4W02C/4W02C-01 Payor Info: Payor: [employer] / Plan: [employer] / Product Type: *No Product type* /    Admitting Diagnosis: Femur FX  Admit Date/Time:  05/29/2016  3:19 PM Admission Comments: No comment available   Primary Diagnosis:  <principal problem not specified> Principal Problem: <principal problem not specified>  Patient Active Problem List   Diagnosis Date Noted  . 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 Physical deconditioning   . Closed fracture of shaft of left femur ([place])   . Adjustment disorder with mixed anxiety and depressed mood   . Postoperative pain   . Bilateral low back pain without sciatica   . Pressure ulcer   . Thrombocytopenia ([place])   . Arterial hypotension   . Functional diarrhea   . Anemia of chronic disease   . Multiple myeloma not having achieved remission ([place])   . Antineoplastic chemotherapy induced pancytopenia ([place])   . Low back pain   . Post-op pain   . Surgery, elective   . Acute blood loss anemia   . ESRD on dialysis ([place])   . Essential hypertension   . Urinary tract infection, site not specified   . Vitamin B12 deficiency   . Multiple myeloma ([place]) 05/14/2016  . Acute renal failure ([place])   . 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 Anemia due to bone marrow failure ([place])   . 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 Occult blood positive stool 05/10/2016  . Malnutrition of moderate degree 05/10/2016    Expected Discharge Date: Expected Discharge Date: 06/08/16  Team Members Present: Physician leading conference: Dr. Alysia Penna Social Worker Present: Ovidio Kin, LCSW Nurse Present: Dorien Chihuahua, RN PT Present: Kem Parkinson, PT OT Present: Simonne Come, OT SLP Present: Windell Moulding, SLP PPS Coordinator present : Daiva Nakayama, RN, CRRN     Current Status/Progress Goal Weekly Team Focus  Medical   Patient with nausea, constipation  Improve by mouth intake, decreased nausea complaints, regulate bowels  Medication management with laxatives, when necessary Zofran   Bowel/Bladder   cont x2. oliguric. LBM: 05/26/16 per report.   cont x2  promotote regular BM, educated on PRN bowel movements    Swallow/Nutrition/ Hydration             ADL's   Mod assist LB bathing and sit > stand, min assist ambulation and transfers with RW  Mod I overall, supervision bathing and shower transfers  activity tolerance, ADL retraining, overall strengthening   Mobility   minA bed mobility, minA/min guard transfers, gait, stairs  mod I transfers, S gait in home and community with LRAD  activity tolerance, general strengthening, LLE ROM   Communication             Safety/Cognition/ Behavioral Observations            Pain   no complaints of pain. occassional PRN robaxin use  <3  assess and treat qshift and PRN    Skin   MASD to sacrum (WOC consulted), L hip with mepilex dressing. BSS= 16  free from infection/breakdown   barrier cream, and pressure relief, assess for signs of infection/breakdown       *See Care  Plan and progress notes for long and short-term goals.  Barriers to Discharge: Still having medical issues that interfere with rehabilitation    Possible Resolutions to Barriers:  See above    Discharge Planning/Teaching Needs:  HOme with wife and daughter who is off for the summer. Both here daily with pt.      Team Discussion:  Goals supervision level. Dealing with constipation and nausea today, RN working on this. Limited knee flexion-PT to work on this. Dietary to see due to decreased appetite to discuss choices. Family here daily to observe and provide support.  Revisions to Treatment Plan:  None   Continued Need for Acute  Rehabilitation Level of Care: The patient requires daily medical management by a physician with specialized training in physical medicine and rehabilitation for the following conditions: Daily direction of a multidisciplinary physical rehabilitation program to ensure safe treatment while eliciting the highest outcome that is of practical value to the patient.: Yes Daily medical management of patient stability for increased activity during participation in an intensive rehabilitation regime.: Yes Daily analysis of laboratory values and/or radiology reports with any subsequent need for medication adjustment of medical intervention for : Post surgical problems;Nutritional problems  [name], Gardiner Rhyme 05/31/2016, 3:03 PM

## 2016-05-31 NOTE — Progress Notes (Signed)
Occupational Therapy Session Note  Patient Details  Name: Thomas [name] MRN: [id number] Date of Birth: 01/16/1946  Today's Date: 05/31/2016 OT Individual Time: [id number] OT Individual Time Calculation (min): 30 min  and Today's Date: 05/31/2016 OT Missed Time: 30 Minutes Missed Time Reason: Patient ill (comment)   Short Term Goals: Week 1:  OT Short Term Goal 1 (Week 1): Pt will complete LB dressing with supervision and use of AE as needed OT Short Term Goal 2 (Week 1): Pt will complete 2 grooming tasks in standing at sink with supervision OT Short Term Goal 3 (Week 1): Pt will complete sit > stand froom various height surfaces to increase success with functional transfers OT Short Term Goal 4 (Week 1): Pt will complete toilet transfer to [facility] over toilet with supervision and LRAD  Skilled Therapeutic Interventions/Progress Updates:    1) Treatment session with focus on activity tolerance and increased independence with LB dressing.  MD present upon arrival and pt reporting increase nausea.  RN notified for IV zofran.  Educated pt on reacher, shoe horn, and sock aid to assist with LB dressing and decrease burden of care.  Therapist demonstrated use of each with pt reporting understanding, however hesitant to attempt secondary to increased nausea with movement. Pt began "dry heaving" during session and reports unable to continue to participate in treatment session at this time, although quite apologetic.  Encouraged pt to attempt to eat breakfast to address nausea as taking pain meds on empty stomach in addition to existing nausea.  Will follow up as schedule allows and pt tolerates.  Therapy Documentation Precautions:  Precautions Precautions: Fall Restrictions Weight Bearing Restrictions: Yes LLE Weight Bearing: Weight bearing as tolerated General: General OT Amount of Missed Time: 30 Minutes Vital Signs: Therapy Vitals Temp: 98.5 F (36.9 C) Temp Source: Oral Pulse Rate: 80 Resp:  18 BP: 131/63 mmHg Patient Position (if appropriate): Lying Oxygen Therapy SpO2: 100 % O2 Device: Not Delivered Pain: Pain Assessment Pain Assessment: 0-10 Pain Score: 8  Pain Type: Surgical pain Pain Location: Leg Pain Orientation: Left Pain Descriptors / Indicators: Pressure;Sore Pain Onset: On-going Patients Stated Pain Goal: 3 Pain Intervention(s): RN made aware  See Function Navigator for Current Functional Status.   Therapy/Group: Individual Therapy  Simonne Come 05/31/2016, 9:39 AM

## 2016-05-31 NOTE — Progress Notes (Signed)
LBM per report 05/26/2016. Patient cannot remember when it was. Patient educated and still refusing suppository. Timoteo Ace, RN

## 2016-05-31 NOTE — Progress Notes (Signed)
Physical Therapy Session Note  Patient Details  Name: Thomas [name] MRN: [id number] Date of Birth: [birth year]  Today's Date: 05/31/2016 PT Individual Time: 0800-0900 PT Individual Time Calculation (min): 60 min   Short Term Goals: Week 1:  PT Short Term Goal 1 (Week 1): Pt will perform bed mobility with S using bed features PT Short Term Goal 2 (Week 1): Pt will perform sit <>stand with S PT Short Term Goal 3 (Week 1): Pt will perform bed <>w/c transfer with S PT Short Term Goal 4 (Week 1): Pt will ambulate 150' min guard with LRAD PT Short Term Goal 5 (Week 1): Pt will ascend/descend 2 6" stairs with min guard using RW  Skilled Therapeutic Interventions/Progress Updates:    Tx 1: Pt received supine in bed, c/o pain LLE as described below and agreeable to treatment. Also notes L side back pain when getting OOB, states it is the first time he has noticed back pain. Lower body dressing modA for threading L pants leg, L shoe, B TEDs. Supine>sit with S and bedrails. Sit <>stand and standing balance while pulling up pants with min guard. Stand pivot transfer x4 in session with RW and min guard. W/c propulsion to/from gym 2x150' with S. Gait x80' and x60' with RW and min guard. Pt with increased LLE pain with ambulation. Supine/seated LE strengthening exercises 2x10 reps each including straight leg raise (RLE only), long arc quad, hip adduction isometric. In supine and sitting LLE hip/knee ROM assessed; hip grossly WNL, however knee flexion limited to approximately 50 degrees. Pt given handout with LE exercises and reviewed with pt/family, instructed in performance outside of regular therapy time. Remained seated in w/c at completion of session, all needs in reach.   Tx 2: Pt missed second session 60 minutes due to nausea/vomiting and pt not wanting to get OOB after recently getting suppository. Discussed with family recommendation for using BSC as able when pt needs to go to the bathroom for better  ergonomics and body positioning to assist with having successful bowel movement. Pt remained in bed, family present and all needs within reach.   Therapy Documentation Precautions:  Precautions Precautions: Fall Restrictions Weight Bearing Restrictions: Yes LLE Weight Bearing: Weight bearing as tolerated General: PT Amount of Missed Time (min): 60 Minutes PT Missed Treatment Reason: Patient ill (Comment) (N/V with recent suppository) Pain: Pain Assessment Pain Assessment: 0-10 Pain Score: 8  Pain Type: Surgical pain Pain Location: Leg Pain Orientation: Left Pain Descriptors / Indicators: Pressure;Sore Pain Onset: On-going Patients Stated Pain Goal: 3 Pain Intervention(s): RN made aware   See Function Navigator for Current Functional Status.   Therapy/Group: Individual Therapy  Luberta Mutter 05/31/2016, 3:59 PM

## 2016-05-31 NOTE — Plan of Care (Signed)
Problem: RH BOWEL ELIMINATION Goal: RH STG MANAGE BOWEL WITH ASSISTANCE STG Manage Bowel with min.Assistance.  Outcome: Not Progressing Per report LBM: 6/16

## 2016-05-31 NOTE — Progress Notes (Signed)
Assessment/ Plan: Pt is a 70 y.o. yo male who was admitted on 05/09/2016 with new renal failure in the setting of newly diagnosed myeloma  Assessment/Plan: 1. ESRD w/new diagnosis of myeloma-  HD requiring since 5/31- TTS schedule- CLIP done- spot at TTS at [place]- s/p AVF placed on 6/14- has PC as well.  HD Thursday with no fluid removal 2. Myeloma- new dx- started on velcade per onc on 6/8  3. 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 Secondary hyperparathyroidism- PTH low- phos 5- added phoslo 5. HTN/volume- BP lowish- no BP meds- does not seem too volume overloaded- minimal goals with HD  6. Nausea and vomiting  Subjective: Interval History: -2L with HD yesterday  Objective: Vital signs in last 24 hours: Temp:  [97.7 F (36.5 C)-98.5 F (36.9 C)] 98.5 F (36.9 C) (06/21 0549) Pulse Rate:  [70-80] 80 (06/21 0549) Resp:  [18-20] 18 (06/21 0549) BP: (107-149)/(55-74) 131/63 mmHg (06/21 0549) SpO2:  [100 %] 100 % (06/21 0549) Weight:  [72.8 kg (160 lb 7.9 oz)] 72.8 kg (160 lb 7.9 oz) (06/20 1543) Weight change:   Intake/Output from previous day: 06/20 0701 - 06/21 0700 In: 120 [P.O.:120] Out: 2000 [Urine:500] Intake/Output this shift: Total I/O In: 120 [P.O.:120] Out: -   General appearance: mild distress  Lungs clear, Cor RRR, Muscle wasting No edema  Lab Results:  Recent Labs  05/29/16 0516 05/30/16 0518  WBC 2.5* 3.1*  HGB 8.6* 8.8*  HCT 27.0* 27.3*  PLT 47* 55*   BMET:  Recent Labs  05/29/16 0516 05/30/16 1614  NA 131* 130*  K 3.8 4.1  CL 96* 95*  CO2 28 26  GLUCOSE 119* 158*  BUN 35* 46*  CREATININE 4.64* 6.09*  CALCIUM 7.9* 8.1*   No results for input(s): PTH in the last 72 hours. Iron Studies: No results for input(s): IRON, TIBC, TRANSFERRIN, FERRITIN in the last 72 hours. Studies/Results: No results found.  Scheduled: . acyclovir  200 mg Oral BID  . calcium acetate  667 mg Oral TID WC  . [START ON 06/03/2016]  darbepoetin (ARANESP) injection - DIALYSIS  100 mcg Intravenous Q Sat-HD  . [START ON 06/01/2016] dexamethasone  40 mg Oral Weekly  . guaiFENesin  600 mg Oral BID  . heparin  5,000 Units Subcutaneous Q8H  . multivitamin  1 tablet Oral QHS  . polyethylene glycol  17 g Oral Daily  . senna-docusate  2 tablet Oral QHS  . vitamin B-12  1,000 mcg Oral Daily  . [START ON 06/01/2016] Vitamin D (Ergocalciferol)  50,000 Units Oral Q7 days    LOS: 2 days   Verlie Hellenbrand C 05/31/2016,11:20 AM

## 2016-06-01 ENCOUNTER — Inpatient Hospital Stay (HOSPITAL_COMMUNITY): Payer: PPO | Admitting: Physical Therapy

## 2016-06-01 ENCOUNTER — Inpatient Hospital Stay (HOSPITAL_COMMUNITY): Payer: PPO | Admitting: Occupational Therapy

## 2016-06-01 ENCOUNTER — Inpatient Hospital Stay (HOSPITAL_COMMUNITY): Payer: PPO

## 2016-06-01 DIAGNOSIS — R112 Nausea with vomiting, unspecified: Secondary | ICD-10-CM

## 2016-06-01 DIAGNOSIS — D519 Vitamin B12 deficiency anemia, unspecified: Secondary | ICD-10-CM

## 2016-06-01 DIAGNOSIS — N189 Chronic kidney disease, unspecified: Secondary | ICD-10-CM

## 2016-06-01 DIAGNOSIS — D61818 Other pancytopenia: Secondary | ICD-10-CM

## 2016-06-01 DIAGNOSIS — E559 Vitamin D deficiency, unspecified: Secondary | ICD-10-CM

## 2016-06-01 DIAGNOSIS — E43 Unspecified severe protein-calorie malnutrition: Secondary | ICD-10-CM

## 2016-06-01 LAB — COMPREHENSIVE METABOLIC PANEL
ALK PHOS: 104 U/L (ref 38–126)
ALT: 20 U/L (ref 17–63)
ANION GAP: 8 (ref 5–15)
AST: 20 U/L (ref 15–41)
Albumin: 2.2 g/dL — ABNORMAL LOW (ref 3.5–5.0)
BILIRUBIN TOTAL: 1.6 mg/dL — AB (ref 0.3–1.2)
BUN: 23 mg/dL — ABNORMAL HIGH (ref 6–20)
CALCIUM: 8.4 mg/dL — AB (ref 8.9–10.3)
CO2: 27 mmol/L (ref 22–32)
Chloride: 97 mmol/L — ABNORMAL LOW (ref 101–111)
Creatinine, Ser: 5.02 mg/dL — ABNORMAL HIGH (ref 0.61–1.24)
GFR calc non Af Amer: 11 mL/min — ABNORMAL LOW (ref >60)
GFR, EST AFRICAN AMERICAN: 12 mL/min — AB (ref >60)
Glucose, Bld: 80 mg/dL (ref 65–99)
POTASSIUM: 4.1 mmol/L (ref 3.5–5.1)
Sodium: 132 mmol/L — ABNORMAL LOW (ref 135–145)
Total Protein: 7.2 g/dL (ref 6.5–8.1)

## 2016-06-01 LAB — CBC WITH DIFFERENTIAL/PLATELET
BASOS PCT: 0 %
Basophils Absolute: 0 10*3/uL (ref 0.0–0.1)
EOS ABS: 0 10*3/uL (ref 0.0–0.7)
Eosinophils Relative: 0 %
HEMATOCRIT: 27.2 % — AB (ref 39.0–52.0)
HEMOGLOBIN: 8.6 g/dL — AB (ref 13.0–17.0)
LYMPHS ABS: 0.9 10*3/uL (ref 0.7–4.0)
Lymphocytes Relative: 21 %
MCH: 29 pg (ref 26.0–34.0)
MCHC: 31.6 g/dL (ref 30.0–36.0)
MCV: 91.6 fL (ref 78.0–100.0)
MONO ABS: 0.4 10*3/uL (ref 0.1–1.0)
MONOS PCT: 11 %
NEUTROS PCT: 69 %
Neutro Abs: 2.8 10*3/uL (ref 1.7–7.7)
Platelets: 72 10*3/uL — ABNORMAL LOW (ref 150–400)
RBC: 2.97 MIL/uL — ABNORMAL LOW (ref 4.22–5.81)
RDW: 15.2 % (ref 11.5–15.5)
WBC: 4.1 10*3/uL (ref 4.0–10.5)

## 2016-06-01 MED ORDER — HEPARIN SODIUM (PORCINE) 1000 UNIT/ML DIALYSIS: 20.0000 [IU]/kg | INTRAMUSCULAR | Status: DC | PRN

## 2016-06-01 MED ORDER — ALPRAZOLAM 0.5 MG PO TABS
ORAL_TABLET | ORAL | Status: AC
  Filled 2016-06-01: qty 1

## 2016-06-01 MED ORDER — PANTOPRAZOLE SODIUM 40 MG PO TBEC
40.0000 mg | DELAYED_RELEASE_TABLET | Freq: Every day | ORAL | Status: DC
  Administered 2016-06-01 – 2016-06-08 (×8): 40 mg via ORAL
  Filled 2016-06-01 (×8): qty 1

## 2016-06-01 MED ORDER — PENTAFLUOROPROP-TETRAFLUOROETH EX AERO: 1.0000 "application " | INHALATION_SPRAY | CUTANEOUS | Status: DC | PRN

## 2016-06-01 MED ORDER — SODIUM CHLORIDE 0.9 % IV SOLN: 100.0000 mL | INTRAVENOUS | Status: DC | PRN

## 2016-06-01 MED ORDER — LIDOCAINE HCL (PF) 1 % IJ SOLN: 5.0000 mL | INTRAMUSCULAR | Status: DC | PRN

## 2016-06-01 MED ORDER — METOCLOPRAMIDE HCL 5 MG/ML IJ SOLN
5.0000 mg | Freq: Three times a day (TID) | INTRAMUSCULAR | Status: DC
  Administered 2016-06-01 – 2016-06-02 (×4): 5 mg via INTRAVENOUS
  Filled 2016-06-01 (×4): qty 2

## 2016-06-01 MED ORDER — LIDOCAINE-PRILOCAINE 2.5-2.5 % EX CREA: 1.0000 "application " | TOPICAL_CREAM | CUTANEOUS | Status: DC | PRN

## 2016-06-01 MED ORDER — PROCHLORPERAZINE MALEATE 5 MG PO TABS
10.0000 mg | ORAL_TABLET | Freq: Once | ORAL | Status: AC
  Administered 2016-06-01: 10 mg via ORAL
  Filled 2016-06-01: qty 2

## 2016-06-01 MED ORDER — ALTEPLASE 2 MG IJ SOLR: 2.0000 mg | Freq: Once | INTRAMUSCULAR | Status: DC | PRN

## 2016-06-01 MED ORDER — BORTEZOMIB CHEMO SQ INJECTION 3.5 MG (2.5MG/ML)
1.3000 mg/m2 | Freq: Once | INTRAMUSCULAR | Status: AC
  Administered 2016-06-01: 2.5 mg via SUBCUTANEOUS
  Filled 2016-06-01: qty 2.5

## 2016-06-01 MED ORDER — HEPARIN SODIUM (PORCINE) 1000 UNIT/ML DIALYSIS: 1000.0000 [IU] | INTRAMUSCULAR | Status: DC | PRN

## 2016-06-01 NOTE — Procedures (Signed)
I was present at this session.  I have reviewed the session itself and made appropriate changes.  HD via PC.  tol well. bp 130s.  Minimal vol off  Kelen Laura L 6/22/20174:03 PM

## 2016-06-01 NOTE — Progress Notes (Signed)
Occupational Therapy Note  Patient Details  Name: Thomas [name] MRN: [id number] Date of Birth: 10/10/1946  Today's Date: 06/01/2016 OT Missed Time: 44 Minutes Missed Time Reason: X-Ray  Pt missed 60 mins scheduled OT session secondary to being transported to x-ray.  Will continue to follow as schedule allows and per POC.  Simonne Come 06/01/2016, 10:33 AM

## 2016-06-01 NOTE — Progress Notes (Signed)
Thomas [name]   DOB:Apr 15, 1946   XO#:[id number]    Subjective: He is seen with his wife present. He is tolerating dialysis well apart from progressive fatigue and nausea with each dialysis episode. The patient was transfer to rehabilitation facility 3 days ago. He has not been eating well. He admits he had some mild constipation but has 3 loose stool yesterday after aggressive laxative therapy. He complained of postprandial nausea and vomiting. He also complained of poor choices of food limiting his oral intake and delay in getting food due to hemodialysis on Tuesday. He is motivated to participate in rehabilitation but due to weakness from not eating and nausea, he was not able to do aggressive therapy yesterday. He denies severe back pain. He was able to mobilize short distances. He denies new neurological deficit. According to him, he was making around 700 mL of urine for the last 2 days  Objective:  Filed Vitals:   05/31/16 1358 06/01/16 0545  BP: 121/60 133/67  Pulse: 73 69  Temp: 98.4 F (36.9 C) 98.1 F (36.7 C)  Resp: 19 18     Intake/Output Summary (Last 24 hours) at 06/01/16 0759 Last data filed at 05/31/16 1700  Gross per 24 hour  Intake    480 ml  Output    331 ml  Net    149 ml    GENERAL:alert, no distress and comfortable SKIN: skin color is pale, texture, turgor are normal, no rashes or significant lesions. Noted some minor bruising and redness in the left flank but no evidence of cellulitis EYES: normal, Conjunctiva are pale and non-injected, sclera clear OROPHARYNX:no exudate, no erythema and lips, buccal mucosa, and tongue normal . Dry mucous membrane NECK: supple, thyroid normal size, non-tender, without nodularity LYMPH:  no palpable lymphadenopathy in the cervical, axillary or inguinal LUNGS: clear to auscultation and percussion with normal breathing effort HEART: regular rate & rhythm and no murmurs and no lower extremity edema ABDOMEN:abdomen soft, non-tender and  normal bowel sounds Musculoskeletal:no cyanosis of digits and no clubbing  NEURO: alert & oriented x 3 with fluent speech, no focal motor/sensory deficits   Labs:  Lab Results  Component Value Date   WBC 4.1 06/01/2016   HGB 8.6* 06/01/2016   HCT 27.2* 06/01/2016   MCV 91.6 06/01/2016   PLT 72* 06/01/2016   NEUTROABS 2.8 06/01/2016    Lab Results  Component Value Date   NA 132* 06/01/2016   K 4.1 06/01/2016   CL 97* 06/01/2016   CO2 27 06/01/2016    Assessment & Plan:   IgG kappa multiple myeloma I had long discussion with the patient and family members regarding 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 The patient presented with significant bone lesions, renal failure and pancytopenia.He was started on weekly pulse dose dexamethasone 40 mg on 05/18/16. He was started on Velcade subcutaneous injections on days 1,4,8,11 on 05/18/16. I tried to avoid weekend injection. Next dose (day 11 due today). He had delay of Day 11 injection due to pancytopenia. I will get chemotherapy certified nursing staff from [place] to proceed with Velcade injection at [facility] So far he tolerated treatment well apart from very mild skin rash which is expected. I will avoid IV bisphosphonates with his renal failure for now If his kidney function improve in the future, I will add Revlimid as an outpatient He has no evidence of tumor lysis syndrome He will get acyclovir for viral prophylaxis while on Velcade  Renal failure secondary  to multiple myeloma He is receiving hemodialysis. There is no contraindication for him to receive dexamethasone or Velcade while on hemodialysis as they are not renally cleared Hopefully, his hemodialysis treatment can be transitioned to outpatient treatment  Significant bone lesions throughout He had prophylactic surgery to the left femur. MRI shows significant lesion in the lumbar spine. He was seen by radiation oncologist with plan for radiation therapy  in a palliative fashion to the femur and the lumbar spine, non-urgent His pain is currently well controlled with current pain medication regimen.  Severe pancytopenia, vitamin B-12 deficiency Multifactorial, likely due to multiple myeloma and severe vitamin B-12 deficiency and renal failure I agreed for him to receive blood transfusion to keep hemoglobin greater than 7, He is receiving Aranesp I have added one injection of vitamin B 12 on 05/18/16. He will continue high-dose supplement daily by mouth OK to proceed with treatment as long as platelet count is >50,000  Severe constipation, resolved Multifactorial likely due to dehydration and medication side effects This has resolved with aggressive laxative therapy Due to persistent nausea, I will add abdomeinal x-ray today for further evaluation  Severe vitamin D deficiency I started him on high-dose replacement therapy, he will get high dose vitamin D every Thursday  Severe protein calorie malnutrition Continue dietitian consult and frequent small meals He has poor oral intake due to limited choice of food. I recommend he consult with dietitian again for more liberal oral intake  Severe nausea and vomiting This is postprandial in nature, could be related to recent constipation or gastritis. I have added protonix. I will order abdominal x-ray. I have guided IV Reglan 3 times a day and will reassess tomorrow  DVT prophylaxis I recommend heparin SQ as long as platelet count is >50,000  Discharge planning Unknown.   I will return tomorrow to check on him Hopefully, we can transition his future treatment to outpatient  [facility], Jaleigha Deane, MD 06/01/2016  7:59 AM

## 2016-06-01 NOTE — Progress Notes (Signed)
Subjective/Complaints:  Couple of smears but no large volume BM despite constipation x 6 d Reviewed KUB  Review of systems denies any chest pain no shortness of breath, no urinary symptoms, no abd pain, N/V improved today Objective: Vital Signs: Blood pressure 120/60, pulse 74, temperature 98 F (36.7 C), temperature source Oral, resp. rate 18, height 6' 1"  (1.854 m), weight 71.2 kg (156 lb 15.5 oz), SpO2 100 %. Dg Abd 2 Views  06/01/2016  CLINICAL DATA:  Constipation.  Nausea.  Recent surgery. EXAM: ABDOMEN - 2 VIEW COMPARISON:  None. FINDINGS: No dilated small bowel loops. Mild to moderate colorectal stool volume. No evidence of pneumatosis or pneumoperitoneum. Moderate thoracolumbar spondylosis. Partially visualized surgical hardware in the left proximal femur. No pathologic soft tissue calcifications. IMPRESSION: Nonobstructive bowel gas pattern. Mild to moderate colorectal stool volume. Electronically Signed   By: Ilona Sorrel M.D.   On: 06/01/2016 09:46   Results for orders placed or performed during the hospital encounter of 05/29/16 (from the past 72 hour(s))  CBC     Status: Abnormal   Collection Time: 05/30/16  5:18 AM  Result Value Ref Range   WBC 3.1 (L) 4.0 - 10.5 K/uL   RBC 3.03 (L) 4.22 - 5.81 MIL/uL   Hemoglobin 8.8 (L) 13.0 - 17.0 g/dL   HCT 27.3 (L) 39.0 - 52.0 %   MCV 90.1 78.0 - 100.0 fL   MCH 29.0 26.0 - 34.0 pg   MCHC 32.2 30.0 - 36.0 g/dL   RDW 14.9 11.5 - 15.5 %   Platelets 55 (L) 150 - 400 K/uL    Comment: CONSISTENT WITH PREVIOUS RESULT  Renal function panel     Status: Abnormal   Collection Time: 05/30/16  4:14 PM  Result Value Ref Range   Sodium 130 (L) 135 - 145 mmol/L   Potassium 4.1 3.5 - 5.1 mmol/L   Chloride 95 (L) 101 - 111 mmol/L   CO2 26 22 - 32 mmol/L   Glucose, Bld 158 (H) 65 - 99 mg/dL   BUN 46 (H) 6 - 20 mg/dL   Creatinine, Ser 6.09 (H) 0.61 - 1.24 mg/dL   Calcium 8.1 (L) 8.9 - 10.3 mg/dL   Phosphorus 4.0 2.5 - 4.6 mg/dL   Albumin 2.2  (L) 3.5 - 5.0 g/dL   GFR calc non Af Amer 8 (L) >60 mL/min   GFR calc Af Amer 10 (L) >60 mL/min    Comment: (NOTE) The eGFR has been calculated using the CKD EPI equation. This calculation has not been validated in all clinical situations. eGFR's persistently <60 mL/min signify possible Chronic Kidney Disease.    Anion gap 9 5 - 15  CBC with Differential     Status: Abnormal   Collection Time: 06/01/16  4:56 AM  Result Value Ref Range   WBC 4.1 4.0 - 10.5 K/uL   RBC 2.97 (L) 4.22 - 5.81 MIL/uL   Hemoglobin 8.6 (L) 13.0 - 17.0 g/dL    Comment: CONSISTENT WITH PREVIOUS RESULT   HCT 27.2 (L) 39.0 - 52.0 %   MCV 91.6 78.0 - 100.0 fL   MCH 29.0 26.0 - 34.0 pg   MCHC 31.6 30.0 - 36.0 g/dL   RDW 15.2 11.5 - 15.5 %   Platelets 72 (L) 150 - 400 K/uL    Comment: CONSISTENT WITH PREVIOUS RESULT   Neutrophils Relative % 69 %   Neutro Abs 2.8 1.7 - 7.7 K/uL   Lymphocytes Relative 21 %   Lymphs Abs  0.9 0.7 - 4.0 K/uL   Monocytes Relative 11 %   Monocytes Absolute 0.4 0.1 - 1.0 K/uL   Eosinophils Relative 0 %   Eosinophils Absolute 0.0 0.0 - 0.7 K/uL   Basophils Relative 0 %   Basophils Absolute 0.0 0.0 - 0.1 K/uL  Comprehensive metabolic panel     Status: Abnormal   Collection Time: 06/01/16  4:56 AM  Result Value Ref Range   Sodium 132 (L) 135 - 145 mmol/L   Potassium 4.1 3.5 - 5.1 mmol/L   Chloride 97 (L) 101 - 111 mmol/L   CO2 27 22 - 32 mmol/L   Glucose, Bld 80 65 - 99 mg/dL   BUN 23 (H) 6 - 20 mg/dL   Creatinine, Ser 5.02 (H) 0.61 - 1.24 mg/dL   Calcium 8.4 (L) 8.9 - 10.3 mg/dL   Total Protein 7.2 6.5 - 8.1 g/dL   Albumin 2.2 (L) 3.5 - 5.0 g/dL   AST 20 15 - 41 U/L   ALT 20 17 - 63 U/L   Alkaline Phosphatase 104 38 - 126 U/L   Total Bilirubin 1.6 (H) 0.3 - 1.2 mg/dL   GFR calc non Af Amer 11 (L) >60 mL/min   GFR calc Af Amer 12 (L) >60 mL/min    Comment: (NOTE) The eGFR has been calculated using the CKD EPI equation. This calculation has not been validated in all  clinical situations. eGFR's persistently <60 mL/min signify possible Chronic Kidney Disease.    Anion gap 8 5 - 15     HEENT: edentulous  General: No acute distress Mood and affect are appropriate Heart: Regular rate and rhythm no rubs murmurs or extra sounds Lungs: Clear to auscultation, breathing unlabored, no rales or wheezes Abdomen: Positive bowel sounds, soft nontender to palpation, nondistended Extremities: No clubbing, cyanosis, or edema Skin: No evidence of breakdown, no evidence of rash Neurologic: Cranial nerves II through XII intact, motor strength is 5/5 in bilateral deltoid, , tricep, grip, 4 minus left biceps 5 right bicep, 5/5 righthip flexor, knee extensors, ankle dorsiflexor and plantar flexor, 2 minus left hip flexor, knee extensor, 4 ankle dorsiflexor plantar flexor Sensory exam normal sensation to light touch and proprioception in bilateral upper and lower extremities Cerebellar exam normal finger to nose to finger as well as heel to shin in bilateral upper and lower extremities Musculoskeletal: Full range of motion in all 4 extremities. Ecchymosis and swelling at left hip  Assessment/Plan: 1. Functional deficits secondary to left femur pathological fracture status post IM nail in a patient with newly diagnosed multiple myeloma which require 3+ hours per day of interdisciplinary therapy in a comprehensive inpatient rehab setting. Physiatrist is providing close team supervision and 24 hour management of active medical problems listed below. Physiatrist and rehab team continue to assess barriers to discharge/monitor patient progress toward functional and medical goals. FIM: Function - Bathing Position: Sitting EOB Body parts bathed by patient: Right arm, Left arm, Chest, Abdomen Body parts bathed by helper: Front perineal area, Buttocks, Back Bathing not applicable: Right upper leg, Left upper leg, Right lower leg, Left lower leg Assist Level: Touching or steadying  assistance(Pt > 75%) (Max assist 40% completed by patient)  Function- Upper Body Dressing/Undressing What is the patient wearing?: Button up shirt Button up shirt - Perfomed by patient: Thread/unthread left sleeve, Pull shirt around back, Button/unbutton shirt Button up shirt - Perfomed by helper: Thread/unthread right sleeve Assist Level: Touching or steadying assistance(Pt > 75%) Function - Lower Body Dressing/Undressing  What is the patient wearing?: Underwear, Pants, Shoes, Ted Hose Position: Sitting EOB Underwear - Performed by patient: Thread/unthread right underwear leg, Pull underwear up/down Underwear - Performed by helper: Thread/unthread left underwear leg Pants- Performed by patient: Thread/unthread right pants leg, Pull pants up/down Pants- Performed by helper: Thread/unthread left pants leg Shoes - Performed by helper: Don/doff right shoe, Don/doff left shoe TED Hose - Performed by helper: Don/doff right TED hose, Don/doff left TED hose Assist for footwear: Maximal assist Assist for lower body dressing: Touching or steadying assistance (Pt > 75%) (Mod assist)  Function - Toileting Toileting steps completed by patient: Adjust clothing prior to toileting, Adjust clothing after toileting (per [place] Ingram, NT) Toileting steps completed by helper: Performs perineal hygiene  Function - Toilet Transfers Toilet transfer assistive device: Grab bar Assist level to toilet: Touching or steadying assistance (Pt > 75%) (per [place] Ingram, NT) Assist level from toilet: Touching or steadying assistance (Pt > 75%)  Function - Chair/bed transfer Chair/bed transfer method: Ambulatory Chair/bed transfer assist level: Supervision or verbal cues Chair/bed transfer assistive device: Walker, Armrests Chair/bed transfer details: Verbal cues for sequencing, Verbal cues for technique  Function - Locomotion: Wheelchair Will patient use wheelchair at discharge?: No Max wheelchair distance:  250 Assist Level: Supervision or verbal cues Assist Level: Supervision or verbal cues Assist Level: Supervision or verbal cues Turns around,maneuvers to table,bed, and toilet,negotiates 3% grade,maneuvers on rugs and over doorsills: No Function - Locomotion: Ambulation Assistive device: Walker-rolling Max distance: 90 Assist level: Touching or steadying assistance (Pt > 75%) Assist level: Touching or steadying assistance (Pt > 75%) Assist level: Touching or steadying assistance (Pt > 75%) Walk 150 feet activity did not occur: Safety/medical concerns Walk 10 feet on uneven surfaces activity did not occur: Safety/medical concerns  Function - Comprehension Comprehension: Auditory Comprehension assist level: Follows complex conversation/direction with extra time/assistive device  Function - Expression Expression: Verbal Expression assist level: Expresses complex ideas: With extra time/assistive device  Function - Social Interaction Social Interaction assist level: Interacts appropriately with others with medication or extra time (anti-anxiety, antidepressant).  Function - Problem Solving Problem solving assist level: Solves complex 90% of the time/cues < 10% of the time  Function - Memory Memory assist level: Assistive device: No helper Patient normally able to recall (first 3 days only): Current season, Location of own room, Staff names and faces, That he or she is in a hospital  Medical Problem List and Plan: 1.  Debilitation with decreased mobility secondary to Multiple myeloma/left femur pathologic fracture, Cont CIR PT, OT  Appreciate oncology assist2.  DVT Prophylaxis/Anticoagulation: Subcutaneous heparin. Monitor platelet counts and any signs of bleeding 3. Pain Management: (Chronic and post-op) Hydrocodone as needed, discussed palliative radiation with radiation oncology physician assistant, this would not be occurring until about 2 weeks from now if needed. 4. Mood: Xanax 0.25  mg twice daily as needed 5. Neuropsych: This patient is capable of making decisions on his on behalf. 6. Skin/Wound Care: Monitor sacral decub--foam dressing for now. Routine pressure relief measures. Nephro between  Meals.    7. Fluids/Electrolytes/Nutrition:  Monitor I/O. Renal diet with 1200 ccFR., discussed that fruits and vegetables can be brought in from home 8. Multiple myeloma: Started on Velcade 06/08--follow-up per her oncology services. Weekly pulse dose dexamethasone. 9. ESRD: Strict I/O. 1200 cc/FR.  As per nephro 10. Volume overload/Hypotension:  Monitor for now.   11. Thrombocytopenia: Progressive drop--Monitor with serial checks and for signs of bleeding.   12. Vitamin B12  deficiency: on supplement 13. Left femur pending pathologic fracture s/p IM nailing; WBAT.  14. constipated. Will Resume miralax and change colace and senna to Daily , dulc supp today  15. Acute on chronic anemia: Monitor. Transfuse if necessary 16. HTN: Monitor and provide prns in accordance with increased physical exertion and pain 17 nausea and vomiting. Has received Velcade 2 days ago,  Also no BM 6 days. Will need laxative.No clinical signs of ileus We'll use  When necessary IV Zofran  LOS (Days) 3 A FACE TO FACE EVALUATION WAS PERFORMED  KIRSTEINS,ANDREW E 06/01/2016, 4:32 PM

## 2016-06-01 NOTE — Progress Notes (Signed)
Occupational Therapy Session Note  Patient Details  Name: Thomas [name] MRN: [id number] Date of Birth: 1946-02-20  Today's Date: 06/01/2016 OT Individual Time: 1035-1130 OT Individual Time Calculation (min): 55 min   Short Term Goals: Week 1:  OT Short Term Goal 1 (Week 1): Pt will complete LB dressing with supervision and use of AE as needed OT Short Term Goal 2 (Week 1): Pt will complete 2 grooming tasks in standing at sink with supervision OT Short Term Goal 3 (Week 1): Pt will complete sit > stand froom various height surfaces to increase success with functional transfers OT Short Term Goal 4 (Week 1): Pt will complete toilet transfer to [facility] over toilet with supervision and LRAD  Skilled Therapeutic Interventions/Progress Updates:  Patient found supine in bed with moderate amount of pain in LLE and minimal complaints of nausea. Pt required min encouragement to participate in therapy, but pt willing. Therapist donned bilateral TEDs, then pt sat EOB with HOB down. Pt donned bilateral slip on shoes with min assist (therapist assisting with back part of shoes). From here, pt stood with RW and ambulated a few steps to w/c in front of sink. Pt performed UB/LB bathing & dressing in sit to/from stand position from sink and w/c. Therapist introduced reacher and [place] shoe horn to increase independence with LB dressing. During session, pt took increased time due to feeling weak, complaints of nausea, and overall pain in LLE. Focused skilled intervention on bed mobility, functional transfers, sit to/from stands, ADL retraining, pain management, and overall activity tolerance/endurance. Pt then ambulated to recliner and therapist left pt seated in recliner with all needs within reach and wife present.   Therapy Documentation Precautions:  Precautions Precautions: Fall Restrictions Weight Bearing Restrictions: Yes LLE Weight Bearing: Weight bearing as tolerated  Vital Signs: Therapy Vitals Temp: 98.1 F  (36.7 C) Temp Source: Oral Pulse Rate: 69 Resp: 18 BP: 133/67 mmHg Patient Position (if appropriate): Lying Oxygen Therapy SpO2: 100 %  See Function Navigator for Current Functional Status.  Therapy/Group: Individual Therapy  Chrys Racer , MS, OTR/L, CLT  06/01/2016, 11:33 AM

## 2016-06-01 NOTE — IPOC Note (Signed)
Overall Plan of Care [facility]) Patient Details Name: Thomas [name] MRN: [id number] DOB: [birth year]  Admitting Diagnosis: Femur FX  Hospital Problems: Active Problems:   Multiple myeloma ([place])   Physical deconditioning   Closed fracture of shaft of left femur (HCC)   Adjustment disorder with mixed anxiety and depressed mood   Postoperative pain   Bilateral low back pain without sciatica   Pressure ulcer   Thrombocytopenia (HCC)   Arterial hypotension   Functional diarrhea   Anemia of chronic disease     Functional Problem List: Nursing Endurance, Medication Management, Motor, Nutrition, Pain, Safety, Skin Integrity  PT Balance, Endurance, Motor, Pain, Safety  OT Balance, Endurance, Motor, Pain, Safety, Skin Integrity  SLP    TR         Basic ADL's: OT Grooming, Bathing, Dressing, Toileting     Advanced  ADL's: OT       Transfers: PT Bed Mobility, Bed to Chair, Furniture, Teacher, early years/pre, Metallurgist: PT Ambulation, Stairs     Additional Impairments: OT None  SLP        TR      Anticipated Outcomes Item Anticipated Outcome  Self Feeding no goal  Swallowing      Basic self-care  Mod I - supervision  Toileting  Mod I   Bathroom Transfers Mod I - supervision  Bowel/Bladder  Continent to bladder and bowel with min. assisst.  Transfers  mod I with LRAD  Locomotion  S in home and community with LRAD  Communication     Cognition     Pain  Less than 3,on 1 to 10 scale.  Safety/Judgment  Free from falls during his stay in rehab.   Therapy Plan: PT Intensity: Minimum of 1-2 x/day ,45 to 90 minutes PT Frequency: 5 out of 7 days PT Duration Estimated Length of Stay: 8-12 days OT Intensity: Minimum of 1-2 x/day, 45 to 90 minutes OT Frequency: 5 out of 7 days OT Duration/Estimated Length of Stay: 8-12 days         Team Interventions: Nursing Interventions Patient/Family Education, Disease Management/Prevention, Pain Management, Medication  Management, Discharge Planning  PT interventions Ambulation/gait training, Training and development officer, Community reintegration, Discharge planning, Neuromuscular re-education, UE/LE Coordination activities, UE/LE Strength taining/ROM, Functional mobility training, Psychosocial support, Therapeutic Exercise, Therapeutic Activities, Stair training, DME/adaptive equipment instruction, Patient/family education, Pain management  OT Interventions Training and development officer, Community reintegration, Discharge planning, Disease mangement/prevention, Engineer, drilling, Functional mobility training, Pain management, Patient/family education, Psychosocial support, Self Care/advanced ADL retraining, Skin care/wound managment, Therapeutic Activities, Therapeutic Exercise, UE/LE Strength taining/ROM, UE/LE Coordination activities, Wheelchair propulsion/positioning  SLP Interventions    TR Interventions    SW/CM Interventions Discharge Planning, Psychosocial Support, Patient/Family Education    Team Discharge Planning: Destination: PT-Home ,OT- Home , SLP-  Projected Follow-up: PT-Outpatient PT, OT-  Home health OT, SLP-  Projected Equipment Needs: PT-To be determined, OT- None recommended by OT, SLP-  Equipment Details: PT-likely no equipment needed, has SPC, w/c, RW and BSC at home, OT-pt has all equipment Patient/family involved in discharge planning: PT- Patient, Family member/caregiver,  OT-Patient, Family member/caregiver, SLP-   MD ELOS: 14-16d Medical Rehab Prognosis:  Good Assessment: 70 y.o. (no medical follow up) with onset of back pain March 2017 treated with NSAIDS and conservative care. He was admitted via ED 5/31 with nausea due to uremia, renal failure with BUN/Cr- 61/10.5 and anemia. Dr. Deunte Footman consulted and HD initiated. Bone scan done due to complaints to severe  back pain and showed multiple bony lesions suspicious for MM--largest in left iliac wing, L3 compression  fracture and question L5 compression fracture, and left mid shaft lesion with thinning at risk for pathologic fracture. He underwent IM fixation of left femur on 06/4 by Dr. Lyla Glassing. WBAT LLE with spica wrap left hip. 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 ABLA treated with transfusion and thrombocytopenia being monitored.   Dr. Tammi Klippel consulted and recommended palliative XRT to spine and left femur. Dr. Alvy Bimler consulted for treatment options and patient started on Velcade injections days 1,4,8 and 11 on 06/8. To have weekly pulse decadron 40 mg in conjunction with injections. L-AVF placed by Dr. Bridgett Larsson on 6/14     Now requiring 24/7 Rehab RN,MD, as well as CIR level PT, OT .  Treatment team will focus on ADLs and mobility with goals set at Mod I  See Team Conference Notes for weekly updates to the plan of care

## 2016-06-01 NOTE — Progress Notes (Signed)
Physical Therapy Session Note  Patient Details  Name: Thomas [name] MRN: [id number] Date of Birth: [birth year]  Today's Date: 06/01/2016 PT Individual Time: 1300-1418 PT Individual Time Calculation (min): 78 min   Short Term Goals: Week 1:  PT Short Term Goal 1 (Week 1): Pt will perform bed mobility with S using bed features PT Short Term Goal 2 (Week 1): Pt will perform sit <>stand with S PT Short Term Goal 3 (Week 1): Pt will perform bed <>w/c transfer with S PT Short Term Goal 4 (Week 1): Pt will ambulate 150' min guard with LRAD PT Short Term Goal 5 (Week 1): Pt will ascend/descend 2 6" stairs with min guard using RW  Skilled Therapeutic Interventions/Progress Updates:    Pt received seated in recliner, denies pain and agreeable to treatment. Gait x10' with RW in room to transfer around bed to w/c. W/c propulsion x175' with BUE for strengthening and aerobic endurance. Stand pivot transfer w/c <>mat table with min guard. Educated pt on gastroc/soleus, and hamstring stretch in sitting and supine; performed 3x30 sec each on LLE, supine hamstring stretch performed BLE. 3x10 quad sets LLE. Heel slide 2x10 with overpressure at end range to increase knee flexion ROM. Performed knee flexion ROM in supine; unable to tolerate and similar ROM as able to achieve in supine. Gait 2x90' with RW and min guard. Educated on normalized step length and cued for increased RLE step length, shortening LLE step length. Pt report occasional LLE buckling, however "a lot better than it was a few days ago". Pt declines any additional standing/mobility tasks due to fatigue. W/c propulsion 2x250' with BUE including ramp with S; performed for UE strengthening and aerobic endurance. Stand pivot with RW to return to bed with close S. Remained seated on EOB with RN present at completion of session, all needs in reach.   Therapy Documentation Precautions:  Precautions Precautions: Fall Restrictions Weight Bearing Restrictions:  Yes LLE Weight Bearing: Weight bearing as tolerated   See Function Navigator for Current Functional Status.   Therapy/Group: Individual Therapy  Luberta Mutter 06/01/2016, 2:28 PM

## 2016-06-01 NOTE — Progress Notes (Signed)
Pt is a 70 y.o. yo male who was admitted on 05/09/2016 with new renal failure in the setting of newly diagnosed myeloma  Assessment/Plan: 1. ESRD w/new diagnosis of myeloma- HD requiring since 5/31- TTS schedule- CLIP done- spot at TTS at [place]- s/p AVF placed on 6/14- has PC as well. HD Today with no fluid removal 2. Myeloma- new dx- started on velcade per onc on 6/8   Subjective: Interval History: Diet liberalized and he is happier, some UOP  Objective: Vital signs in last 24 hours: Temp:  [98.1 F (36.7 [name])-98.4 F (36.9 [name])] 98.1 F (36.7 [name]) 07-01-2023 0545) Pulse Rate:  [69-73] 69 Jul 01, 2023 0545) Resp:  [18-19] 18 07/01/2023 0545) BP: (121-133)/(60-67) 133/67 mmHg 07-01-2023 0545) SpO2:  [99 %-100 %] 100 % 07-01-2023 0545) Weight:  [73.1 kg (161 lb 2.5 oz)] 73.1 kg (161 lb 2.5 oz) (06/21 1358) Weight change: 0.3 kg (10.6 oz)  Intake/Output from previous day: 06/21 0701 - 2023/07/01 0700 In: 480 [P.O.:480] Out: 331 [Urine:300; Emesis/NG output:30; Stool:1] Intake/Output this shift: Total I/O In: 120 [P.O.:120] Out: -   General appearance: alert and cooperative Extremities: extremities normal, atraumatic, no cyanosis or edema  Lab Results:  Recent Labs  05/30/16 0518 06/30/16 0456  WBC 3.1* 4.1  HGB 8.8* 8.6*  HCT 27.3* 27.2*  PLT 55* 72*   BMET:  Recent Labs  05/30/16 1614 30-Jun-2016 0456  NA 130* 132*  K 4.1 4.1  CL 95* 97*  CO2 26 27  GLUCOSE 158* 80  BUN 46* 23*  CREATININE 6.09* 5.02*  CALCIUM 8.1* 8.4*   No results for input(s): PTH in the last 72 hours. Iron Studies: No results for input(s): IRON, TIBC, TRANSFERRIN, FERRITIN in the last 72 hours. Studies/Results: Dg Abd 2 Views  2016-06-30  CLINICAL DATA:  Constipation.  Nausea.  Recent surgery. EXAM: ABDOMEN - 2 VIEW COMPARISON:  None. FINDINGS: No dilated small bowel loops. Mild to moderate colorectal stool volume. No evidence of pneumatosis or pneumoperitoneum. Moderate thoracolumbar spondylosis. Partially  visualized surgical hardware in the left proximal femur. No pathologic soft tissue calcifications. IMPRESSION: Nonobstructive bowel gas pattern. Mild to moderate colorectal stool volume. Electronically Signed   By: Ilona Sorrel M.D.   On: 30-Jun-2016 09:46    Scheduled: . acyclovir  200 mg Oral BID  . bortezomib SQ  1.3 mg/m2 (Treatment Plan Actual) Subcutaneous Once  . calcium acetate  667 mg Oral TID WC  . [START ON 06/03/2016] darbepoetin (ARANESP) injection - DIALYSIS  100 mcg Intravenous Q Sat-HD  . dexamethasone  40 mg Oral Weekly  . guaiFENesin  600 mg Oral BID  . heparin  5,000 Units Subcutaneous Q8H  . metoCLOPramide (REGLAN) injection  5 mg Intravenous Q8H  . multivitamin  1 tablet Oral QHS  . pantoprazole  40 mg Oral Daily  . polyethylene glycol  17 g Oral Daily  . prochlorperazine  10 mg Oral Once  . senna-docusate  2 tablet Oral QHS  . vitamin B-12  1,000 mcg Oral Daily  . Vitamin D (Ergocalciferol)  50,000 Units Oral Q7 days    LOS: 3 days   Thomas [name] 06/30/16,12:50 PM

## 2016-06-02 ENCOUNTER — Inpatient Hospital Stay (HOSPITAL_COMMUNITY): Payer: PPO | Admitting: Physical Therapy

## 2016-06-02 ENCOUNTER — Inpatient Hospital Stay (HOSPITAL_COMMUNITY): Payer: PPO | Admitting: Occupational Therapy

## 2016-06-02 ENCOUNTER — Other Ambulatory Visit: Payer: Self-pay | Admitting: Hematology and Oncology

## 2016-06-02 MED ORDER — METOCLOPRAMIDE HCL 5 MG PO TABS
5.0000 mg | ORAL_TABLET | Freq: Three times a day (TID) | ORAL | Status: DC
  Administered 2016-06-02 – 2016-06-08 (×17): 5 mg via ORAL
  Filled 2016-06-02 (×17): qty 1

## 2016-06-02 NOTE — Progress Notes (Signed)
Thomas [name]   DOB:1946/11/11   HR#:[id number]    Subjective: He felt better. He had good appetite with resolution of nausea. He had multiple soft stool with good energy and was able to do significant amount of rehabilitation. He denies back pain.  Objective:  Filed Vitals:   06/01/16 2010 06/02/16 0648  BP: 134/58 108/54  Pulse: 75 77  Temp: 98 F (36.7 C) 98.1 F (36.7 C)  Resp: 18 18     Intake/Output Summary (Last 24 hours) at 06/02/16 0740 Last data filed at 06/01/16 1908  Gross per 24 hour  Intake    120 ml  Output      0 ml  Net    120 ml    GENERAL:alert, no distress and comfortable SKIN: skin color, texture, turgor are normal, no rashes or significant lesions EYES: normal, Conjunctiva are pink and non-injected, sclera clear Musculoskeletal:no cyanosis of digits and no clubbing  NEURO: alert & oriented x 3 with fluent speech, no focal motor/sensory deficits   Labs:  Lab Results  Component Value Date   WBC 4.1 06/01/2016   HGB 8.6* 06/01/2016   HCT 27.2* 06/01/2016   MCV 91.6 06/01/2016   PLT 72* 06/01/2016   NEUTROABS 2.8 06/01/2016    Lab Results  Component Value Date   NA 132* 06/01/2016   K 4.1 06/01/2016   CL 97* 06/01/2016   CO2 27 06/01/2016    Studies:  Dg Abd 2 Views  06/01/2016  CLINICAL DATA:  Constipation.  Nausea.  Recent surgery. EXAM: ABDOMEN - 2 VIEW COMPARISON:  None. FINDINGS: No dilated small bowel loops. Mild to moderate colorectal stool volume. No evidence of pneumatosis or pneumoperitoneum. Moderate thoracolumbar spondylosis. Partially visualized surgical hardware in the left proximal femur. No pathologic soft tissue calcifications. IMPRESSION: Nonobstructive bowel gas pattern. Mild to moderate colorectal stool volume. Electronically Signed   By: Ilona Sorrel M.D.   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 The patient presented with significant bone lesions, renal failure and pancytopenia.He was started on weekly pulse dose dexamethasone 40 mg on 05/18/16. He was started on Velcade subcutaneous injections on days 1,4,8,11 on 05/18/16. I tried to avoid weekend injection. He had delay of Day 11 injection due to pancytopenia. Cycle 2 of treatment will begin on 06/12/2016 I will get chemotherapy certified nursing staff from [facility] to proceed with Velcade injection at [facility] if the patient remain hospitalized So far he tolerated treatment well apart from very mild skin rash which is expected. I will avoid IV bisphosphonates with his renal failure for now. 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 There is no contraindication for him to receive dexamethasone or Velcade while on hemodialysis as they are not renally cleared Hopefully, his hemodialysis treatment can be transitioned to outpatient treatment  Significant bone lesions throughout He had prophylactic surgery to the left femur. MRI shows significant lesion in the lumbar spine. He was seen by radiation oncologist with plan for radiation therapy in a palliative fashion to the femur and the lumbar spine, non-urgent His pain is currently well  controlled with current pain medication regimen.  Severe pancytopenia, vitamin B-12 deficiency Multifactorial, likely due to multiple myeloma and severe vitamin B-12 deficiency and renal failure I agreed for him to receive blood transfusion to keep hemoglobin greater than 7, He is receiving Aranesp I have added one injection of vitamin B 12 on 05/18/16. He will continue high-dose supplement daily by mouth OK to proceed with  treatment as long as platelet count is >50,000  Severe constipation, resolved  Abdomen x-ray confirmed moderate stool burden Multifactorial likely due to dehydration and medication side effects This has resolved with aggressive laxative therapy  Severe vitamin D deficiency I started him on high-dose replacement therapy, he will get high dose vitamin D every Thursday  Severe protein calorie malnutrition Continue dietitian consult and frequent small meals He has poor oral intake due to limited choice of food. I have changed into regular diet for now.  Severe nausea and vomiting, resolved This is postprandial in nature, could be related to recent constipation or gastritis. I have added protonix. I will schedule Reglan 3 times a day and will reassess next week   DVT prophylaxis I recommend heparin SQ as long as platelet count is >50,000  Discharge planning Unknown.  I will return Monday to check on him Hopefully, we can transition his future treatment to outpatient [facility], Korde Jeppsen, MD 06/02/2016  7:40 AM

## 2016-06-02 NOTE — Progress Notes (Signed)
Occupational Therapy Session Note  Patient Details  Name: Thomas [name] MRN: [id number] Date of Birth: 1946-08-11  Today's Date: 06/02/2016 OT Individual Time: [id number] and [id number] OT Individual Time Calculation (min): 57 min and 30 min    Short Term Goals: Week 1:  OT Short Term Goal 1 (Week 1): Pt will complete LB dressing with supervision and use of AE as needed OT Short Term Goal 2 (Week 1): Pt will complete 2 grooming tasks in standing at sink with supervision OT Short Term Goal 3 (Week 1): Pt will complete sit > stand froom various height surfaces to increase success with functional transfers OT Short Term Goal 4 (Week 1): Pt will complete toilet transfer to [facility] over toilet with supervision and LRAD  Skilled Therapeutic Interventions/Progress Updates:    1 of 2) Treatment session with focus on self care skills. Pt ambulated approximately 44ft with RW then moved to [facility] for self propelling to ADL apartment to problem solve anticipated transfers into shower at home. Pt completed x2 transfers in and out of simulated shower with RW and tub bench for demonstrated safety and endurance. Educated pt on stepping in and out of shower with LLE as lead to improve safety as compared to leading with RLE. Pt and family (wife and dtr) education provided regarding recommendation for pt supervision during functional transfers for safety. Pt wife and dtr verbalized understanding and pt agreed. Pt propelled self in WC back to room and transferred to recliner with all needs in reach.   2 of 2) Treatment session with focus on standing tolerance and endurance. Pt ambulated approximately 73ft with RW then moved to [facility] for assisted propel of remaining distance to [employer] therapy room. Pt completed x2 activities at [employer] with RW to challenge dynamic standing tolerance. Pt completed 2:00 minutes and 2:45 minutes before requiring seated break. Observed increased weight bearing on LLE during second activity  due to increased involvement in and requirements of activity. Provided pt education to link task with function and occupations relevant to pt. Pt verbalized enjoyment of activity and wished to know status of activity completion (establishing baseline). Clinician transported pt back to room in [facility] due to reports of pt fatigue. Continue to challenge dynamic standing balance in context of engaging activity.   Therapy Documentation Precautions:  Precautions Precautions: Fall Restrictions Weight Bearing Restrictions: Yes LLE Weight Bearing: Weight bearing as tolerated General:   Vital Signs:  Pain: Pain Assessment Pain Assessment: No/denies pain Pain Score: 0-No pain  See Function Navigator for Current Functional Status.   Therapy/Group: Individual Therapy  Dierdre Searles 06/02/2016, 12:30 PM

## 2016-06-02 NOTE — Progress Notes (Signed)
Subjective/Complaints:  Having larger BMs. Appetite has improved. Diet has been liberalized per renal and oncology Reviewed KUB  Review of systems denies any chest pain no shortness of breath, no urinary symptoms, no abd pain, N/V improved today Objective: Vital Signs: Blood pressure 108/54, pulse 77, temperature 98.1 F (36.7 C), temperature source Oral, resp. rate 18, height _0  (1.854 m), weight 71.8 kg (158 lb 4.6 oz), SpO2 98 %. Dg Abd 2 Views  06/01/2016  CLINICAL DATA:  Constipation.  Nausea.  Recent surgery. EXAM: ABDOMEN - 2 VIEW COMPARISON:  None. FINDINGS: No dilated small bowel loops. Mild to moderate colorectal stool volume. No evidence of pneumatosis or pneumoperitoneum. Moderate thoracolumbar spondylosis. Partially visualized surgical hardware in the left proximal femur. No pathologic soft tissue calcifications. IMPRESSION: Nonobstructive bowel gas pattern. Mild to moderate colorectal stool volume. Electronically Signed   By: Ilona Sorrel M.D.   On: 06/01/2016 09:46   Results for orders placed or performed during the hospital encounter of 05/29/16 (from the past 72 hour(s))  Renal function panel     Status: Abnormal   Collection Time: 05/30/16  4:14 PM  Result Value Ref Range   Sodium 130 (L) 135 - 145 mmol/L   Potassium 4.1 3.5 - 5.1 mmol/L   Chloride 95 (L) 101 - 111 mmol/L   CO2 26 22 - 32 mmol/L   Glucose, Bld 158 (H) 65 - 99 mg/dL   BUN 46 (H) 6 - 20 mg/dL   Creatinine, Ser 6.09 (H) 0.61 - 1.24 mg/dL   Calcium 8.1 (L) 8.9 - 10.3 mg/dL   Phosphorus 4.0 2.5 - 4.6 mg/dL   Albumin 2.2 (L) 3.5 - 5.0 g/dL   GFR calc non Af Amer 8 (L) >60 mL/min   GFR calc Af Amer 10 (L) >60 mL/min    Comment: (NOTE) The eGFR has been calculated using the CKD EPI equation. This calculation has not been validated in all clinical situations. eGFR's persistently <60 mL/min signify possible Chronic Kidney Disease.    Anion gap 9 5 - 15  CBC with Differential     Status: Abnormal   Collection Time: 06/01/16  4:56 AM  Result Value Ref Range   WBC 4.1 4.0 - 10.5 K/uL   RBC 2.97 (L) 4.22 - 5.81 MIL/uL   Hemoglobin 8.6 (L) 13.0 - 17.0 g/dL    Comment: CONSISTENT WITH PREVIOUS RESULT   HCT 27.2 (L) 39.0 - 52.0 %   MCV 91.6 78.0 - 100.0 fL   MCH 29.0 26.0 - 34.0 pg   MCHC 31.6 30.0 - 36.0 g/dL   RDW 15.2 11.5 - 15.5 %   Platelets 72 (L) 150 - 400 K/uL    Comment: CONSISTENT WITH PREVIOUS RESULT   Neutrophils Relative % 69 %   Neutro Abs 2.8 1.7 - 7.7 K/uL   Lymphocytes Relative 21 %   Lymphs Abs 0.9 0.7 - 4.0 K/uL   Monocytes Relative 11 %   Monocytes Absolute 0.4 0.1 - 1.0 K/uL   Eosinophils Relative 0 %   Eosinophils Absolute 0.0 0.0 - 0.7 K/uL   Basophils Relative 0 %   Basophils Absolute 0.0 0.0 - 0.1 K/uL  Comprehensive metabolic panel     Status: Abnormal   Collection Time: 06/01/16  4:56 AM  Result Value Ref Range   Sodium 132 (L) 135 - 145 mmol/L   Potassium 4.1 3.5 - 5.1 mmol/L   Chloride 97 (L) 101 - 111 mmol/L   CO2 27 22 - 32  mmol/L   Glucose, Bld 80 65 - 99 mg/dL   BUN 23 (H) 6 - 20 mg/dL   Creatinine, Ser 5.02 (H) 0.61 - 1.24 mg/dL   Calcium 8.4 (L) 8.9 - 10.3 mg/dL   Total Protein 7.2 6.5 - 8.1 g/dL   Albumin 2.2 (L) 3.5 - 5.0 g/dL   AST 20 15 - 41 U/L   ALT 20 17 - 63 U/L   Alkaline Phosphatase 104 38 - 126 U/L   Total Bilirubin 1.6 (H) 0.3 - 1.2 mg/dL   GFR calc non Af Amer 11 (L) >60 mL/min   GFR calc Af Amer 12 (L) >60 mL/min    Comment: (NOTE) The eGFR has been calculated using the CKD EPI equation. This calculation has not been validated in all clinical situations. eGFR's persistently <60 mL/min signify possible Chronic Kidney Disease.    Anion gap 8 5 - 15     HEENT: edentulous  General: No acute distress Mood and affect are appropriate Heart: Regular rate and rhythm no rubs murmurs or extra sounds Lungs: Clear to auscultation, breathing unlabored, no rales or wheezes Abdomen: Positive bowel sounds, soft nontender to  palpation, nondistended Extremities: No clubbing, cyanosis, or edema Skin: No evidence of breakdown, no evidence of rash Neurologic: Cranial nerves II through XII intact, motor strength is 5/5 in bilateral deltoid, , tricep, grip, 4 minus left biceps 5 right bicep, 5/5 righthip flexor, knee extensors, ankle dorsiflexor and plantar flexor, 2 minus left hip flexor, knee extensor, 4 ankle dorsiflexor plantar flexor Sensory exam normal sensation to light touch and proprioception in bilateral upper and lower extremities Cerebellar exam normal finger to nose to finger as well as heel to shin in bilateral upper and lower extremities Musculoskeletal: Full range of motion in all 4 extremities. Ecchymosis and swelling at left hip  Assessment/Plan: 1. Functional deficits secondary to left femur pathological fracture status post IM nail in a patient with newly diagnosed multiple myeloma which require 3+ hours per day of interdisciplinary therapy in a comprehensive inpatient rehab setting. Physiatrist is providing close team supervision and 24 hour management of active medical problems listed below. Physiatrist and rehab team continue to assess barriers to discharge/monitor patient progress toward functional and medical goals. FIM: Function - Bathing Position: Sitting EOB Body parts bathed by patient: Right arm, Left arm, Chest, Abdomen Body parts bathed by helper: Front perineal area, Buttocks, Back Bathing not applicable: Right upper leg, Left upper leg, Right lower leg, Left lower leg Assist Level: Touching or steadying assistance(Pt > 75%) (Max assist 40% completed by patient)  Function- Upper Body Dressing/Undressing What is the patient wearing?: Button up shirt Button up shirt - Perfomed by patient: Thread/unthread left sleeve, Pull shirt around back, Button/unbutton shirt, Thread/unthread right sleeve Button up shirt - Perfomed by helper: Thread/unthread right sleeve Assist Level: Supervision or  verbal cues Function - Lower Body Dressing/Undressing What is the patient wearing?: Underwear, Pants, Shoes, Ted Hose Position: Sitting EOB Underwear - Performed by patient: Thread/unthread right underwear leg, Pull underwear up/down, Thread/unthread left underwear leg Underwear - Performed by helper: Thread/unthread left underwear leg Pants- Performed by patient: Thread/unthread right pants leg, Thread/unthread left pants leg, Pull pants up/down Pants- Performed by helper: Thread/unthread left pants leg Shoes - Performed by helper: Don/doff right shoe, Don/doff left shoe, Fasten right, Fasten left TED Hose - Performed by helper: Don/doff right TED hose, Don/doff left TED hose Assist for footwear: Partial/moderate assist Assist for lower body dressing: Touching or steadying assistance (Pt >  75%)  Function - Toileting Toileting steps completed by patient: Adjust clothing prior to toileting, Adjust clothing after toileting Toileting steps completed by helper: Performs perineal hygiene  Function - Toilet Transfers Toilet transfer assistive device: Grab bar Assist level to toilet: Touching or steadying assistance (Pt > 75%) (per [place] Ingram, NT) Assist level from toilet: Touching or steadying assistance (Pt > 75%)  Function - Chair/bed transfer Chair/bed transfer method: Ambulatory Chair/bed transfer assist level: Supervision or verbal cues Chair/bed transfer assistive device: Walker, Armrests Chair/bed transfer details: Verbal cues for sequencing, Verbal cues for technique  Function - Locomotion: Wheelchair Will patient use wheelchair at discharge?: No Max wheelchair distance: 250 Assist Level: Supervision or verbal cues Assist Level: Supervision or verbal cues Assist Level: Supervision or verbal cues Turns around,maneuvers to table,bed, and toilet,negotiates 3% grade,maneuvers on rugs and over doorsills: No Function - Locomotion: Ambulation Assistive device: Walker-rolling Max  distance: 215 Assist level: Supervision or verbal cues Assist level: Supervision or verbal cues Assist level: Supervision or verbal cues Walk 150 feet activity did not occur: Safety/medical concerns Assist level: Supervision or verbal cues Walk 10 feet on uneven surfaces activity did not occur: Safety/medical concerns  Function - Comprehension Comprehension: Auditory Comprehension assist level: Follows complex conversation/direction with extra time/assistive device  Function - Expression Expression: Verbal Expression assist level: Expresses complex ideas: With extra time/assistive device  Function - Social Interaction Social Interaction assist level: Interacts appropriately with others with medication or extra time (anti-anxiety, antidepressant).  Function - Problem Solving Problem solving assist level: Solves complex 90% of the time/cues < 10% of the time  Function - Memory Memory assist level: Assistive device: No helper Patient normally able to recall (first 3 days only): Current season, Location of own room, Staff names and faces, That he or she is in a hospital  Medical Problem List and Plan: 1.  Debilitation with decreased mobility secondary to Multiple myeloma/left femur pathologic fracture, Cont CIR PT, OT 2.  DVT Prophylaxis/Anticoagulation: Subcutaneous heparin. Monitor platelet counts and any signs of bleeding, last platelets 70,000 3. Pain Management: (Chronic and post-op) Hydrocodone as needed, discussed palliative radiation with radiation oncology physician assistant, this would not be occurring until about 2 weeks from now if needed. 4. Mood: Xanax 0.25 mg twice daily as needed 5. Neuropsych: This patient is capable of making decisions on his on behalf. 6. Skin/Wound Care: Monitor sacral decub--foam dressing for now. Routine pressure relief measures. Nephro between  Meals.    7. Fluids/Electrolytes/Nutrition:  Monitor I/O. Renal diet with 1200 ccFR., discussed that  fruits and vegetables can be brought in from home 8. Multiple myeloma: Started on Velcade 06/08--follow-up per her oncology services. Weekly pulse dose dexamethasone. 9. ESRD: Strict I/O. 1200 cc/FR.  As per nephro 10. Volume overload/Hypotension:  Monitor for now.   11. Thrombocytopenia: Progressive drop--Monitor with serial checks and for signs of bleeding.   12. Vitamin B12 deficiency: on supplement 13. Left femur pending pathologic fracture s/p IM nailing; WBAT.  14. constipated. Will Resume miralax and change colace and senna to Daily ,patient overall doing better in this regard 15. Acute on chronic anemia: Monitor. Transfuse if necessary 16. HTN: Monitor and provide prns in accordance with increased physical exertion and pain 17 nausea and vomiting.multifactorial, chemotherapy medication, severe constipation, resolving  LOS (Days) 4 A FACE TO FACE EVALUATION WAS PERFORMED  KIRSTEINS,ANDREW E 06/02/2016, 8:52 AM

## 2016-06-02 NOTE — Progress Notes (Signed)
Physical Therapy Session Note  Patient Details  Name: Thomas [name] MRN: [id number] Date of Birth: Jul 15, 1946  Today's Date: 06/02/2016 PT Individual Time: [id number] and [id number]  PT Individual Time Calculation (min): 55 min and 45 min (total 100 min)  Short Term Goals: Week 1:  PT Short Term Goal 1 (Week 1): Pt will perform bed mobility with S using bed features PT Short Term Goal 2 (Week 1): Pt will perform sit <>stand with S PT Short Term Goal 3 (Week 1): Pt will perform bed <>w/c transfer with S PT Short Term Goal 4 (Week 1): Pt will ambulate 150' min guard with LRAD PT Short Term Goal 5 (Week 1): Pt will ascend/descend 2 6" stairs with min guard using RW  Skilled Therapeutic Interventions/Progress Updates:    Tx 1: Pt received seated in bed eating breakfast, denies pain and agreeable to treatment. Performed upper body dressing with S, lower body dressing modA for donning TEDs and B shoes. Sit <>stand from EOB with S using bedrails and RW. Minor LOB posteriorly while pt pulling up pants with BUEs, able to recover without assist using BUEs on bed behind him. Gait to gym x215' with RW and S to gym; one short standing rest break during trial. Sit <>supine on bed with S. 2x10 SLR BLE, RLE AROM/LLE AAROM. LLE heel slides with overpressure at end range 1x10 reps. Limited by LLE pain in lateral thigh/hip which he describes as "pulling"; requires several rest breaks. Stand pivot transfer with RW >w/c with S. Returned to room totalA for energy conservation. Gait x10' with RW and S to return to recliner. Remained seated with family present and all needs in reach at completion of session.   Tx 2:Pt received seated in recliner, denies pain and agreeable to treatment. Gait to gym x215' with RW and S. Stairs x12 on 3" and 6" corner stairs with B handrails. Performed RLE ascent, LLE descent for majority of steps except for up 4 3" steps with lead LLE for strengthening and weight bearing. Gait up/down one 4"  curb step x2 trials to simulate home environment. Nustep x5 min total with self-selected AROM LLE. Improved to 66 degrees knee flexion ROM LLE by completion of 5 min. Returned to room with w/c propulsion S x250'. Gait into/out of bathroom with RW and S. Performed clothing management with S, unable to have bowel movement so no hygiene performed. Remained seated in recliner at completion of session, all needs in reach.   Therapy Documentation Precautions:  Precautions Precautions: Fall Restrictions Weight Bearing Restrictions: Yes LLE Weight Bearing: Weight bearing as tolerated General: PT Amount of Missed Time (min): 5 Minutes PT Missed Treatment Reason:  (eating breakfast)   See Function Navigator for Current Functional Status.   Therapy/Group: Individual Therapy  Luberta Mutter 06/02/2016, 1:47 PM

## 2016-06-02 NOTE — Progress Notes (Signed)
Pt is a 70 y.o. yo male who was admitted on 05/09/2016 with new renal failure in the setting of newly diagnosed myeloma  Assessment/Plan: 1. ESRD w/new diagnosis of myeloma- HD requiring since 5/31- TTS schedule- CLIP done- spot at TTS at [place]- s/p AVF placed on 6/14- has PC as well. HD tomorrow with no fluid removal 2. Myeloma- new dx- started on velcade per onc on 6/8   Subjective: Interval History: Working with rehab  Objective: Vital signs in last 24 hours: Temp:  [98 F (36.7 C)-98.6 F (37 C)] 98.1 F (36.7 C) (06/23 0648) Pulse Rate:  [68-77] 77 (06/23 0648) Resp:  [16-20] 18 (06/23 0648) BP: (100-134)/(54-64) 108/54 mmHg (06/23 0648) SpO2:  [98 %-100 %] 98 % (06/23 0648) Weight:  [71.2 kg (156 lb 15.5 oz)-71.8 kg (158 lb 4.6 oz)] 71.8 kg (158 lb 4.6 oz) (06/23 0648) Weight change: -1.9 kg (-4 lb 3 oz)  Intake/Output from previous day: 06-27-23 0701 - 06/23 0700 In: 480 [P.O.:480] Out: 0  Intake/Output this shift:    ambulating in hallway with PT  Alert and appropriate  Lab Results:  Recent Labs  06/26/16 0456  WBC 4.1  HGB 8.6*  HCT 27.2*  PLT 72*   BMET:  Recent Labs  05/30/16 1614 06/26/16 0456  NA 130* 132*  K 4.1 4.1  CL 95* 97*  CO2 26 27  GLUCOSE 158* 80  BUN 46* 23*  CREATININE 6.09* 5.02*  CALCIUM 8.1* 8.4*   No results for input(s): PTH in the last 72 hours. Iron Studies: No results for input(s): IRON, TIBC, TRANSFERRIN, FERRITIN in the last 72 hours. Studies/Results: Dg Abd 2 Views  26-Jun-2016  CLINICAL DATA:  Constipation.  Nausea.  Recent surgery. EXAM: ABDOMEN - 2 VIEW COMPARISON:  None. FINDINGS: No dilated small bowel loops. Mild to moderate colorectal stool volume. No evidence of pneumatosis or pneumoperitoneum. Moderate thoracolumbar spondylosis. Partially visualized surgical hardware in the left proximal femur. No pathologic soft tissue calcifications. IMPRESSION: Nonobstructive bowel gas pattern. Mild to moderate colorectal  stool volume. Electronically Signed   By: Ilona Sorrel M.D.   On: Jun 26, 2016 09:46    Scheduled: . acyclovir  200 mg Oral BID  . calcium acetate  667 mg Oral TID WC  . [START ON 06/03/2016] darbepoetin (ARANESP) injection - DIALYSIS  100 mcg Intravenous Q Sat-HD  . dexamethasone  40 mg Oral Weekly  . guaiFENesin  600 mg Oral BID  . heparin  5,000 Units Subcutaneous Q8H  . metoCLOPramide  5 mg Oral TID AC  . multivitamin  1 tablet Oral QHS  . pantoprazole  40 mg Oral Daily  . polyethylene glycol  17 g Oral Daily  . senna-docusate  2 tablet Oral QHS  . vitamin B-12  1,000 mcg Oral Daily  . Vitamin D (Ergocalciferol)  50,000 Units Oral Q7 days     LOS: 4 days   Vyctoria Dickman C 06/02/2016,1:55 PM

## 2016-06-03 ENCOUNTER — Inpatient Hospital Stay (HOSPITAL_COMMUNITY): Payer: PPO | Admitting: *Deleted

## 2016-06-03 ENCOUNTER — Inpatient Hospital Stay (HOSPITAL_COMMUNITY): Payer: PPO | Admitting: Occupational Therapy

## 2016-06-03 LAB — RENAL FUNCTION PANEL
ALBUMIN: 2.2 g/dL — AB (ref 3.5–5.0)
ANION GAP: 7 (ref 5–15)
BUN: 23 mg/dL — ABNORMAL HIGH (ref 6–20)
CHLORIDE: 99 mmol/L — AB (ref 101–111)
CO2: 27 mmol/L (ref 22–32)
Calcium: 8.1 mg/dL — ABNORMAL LOW (ref 8.9–10.3)
Creatinine, Ser: 5.29 mg/dL — ABNORMAL HIGH (ref 0.61–1.24)
GFR calc Af Amer: 12 mL/min — ABNORMAL LOW (ref >60)
GFR calc non Af Amer: 10 mL/min — ABNORMAL LOW (ref >60)
GLUCOSE: 138 mg/dL — AB (ref 65–99)
PHOSPHORUS: 2.9 mg/dL (ref 2.5–4.6)
POTASSIUM: 3.7 mmol/L (ref 3.5–5.1)
Sodium: 133 mmol/L — ABNORMAL LOW (ref 135–145)

## 2016-06-03 LAB — CBC
HCT: 24.1 % — ABNORMAL LOW (ref 39.0–52.0)
HEMOGLOBIN: 7.7 g/dL — AB (ref 13.0–17.0)
MCH: 29.1 pg (ref 26.0–34.0)
MCHC: 32 g/dL (ref 30.0–36.0)
MCV: 90.9 fL (ref 78.0–100.0)
Platelets: 106 10*3/uL — ABNORMAL LOW (ref 150–400)
RBC: 2.65 MIL/uL — AB (ref 4.22–5.81)
RDW: 16.3 % — ABNORMAL HIGH (ref 11.5–15.5)
WBC: 4.1 10*3/uL (ref 4.0–10.5)

## 2016-06-03 MED ORDER — ALTEPLASE 2 MG IJ SOLR: 2.0000 mg | Freq: Once | INTRAMUSCULAR | Status: DC | PRN

## 2016-06-03 MED ORDER — PENTAFLUOROPROP-TETRAFLUOROETH EX AERO: 1.0000 "application " | INHALATION_SPRAY | CUTANEOUS | Status: DC | PRN

## 2016-06-03 MED ORDER — HEPARIN SODIUM (PORCINE) 1000 UNIT/ML DIALYSIS: 1000.0000 [IU] | INTRAMUSCULAR | Status: DC | PRN

## 2016-06-03 MED ORDER — SODIUM CHLORIDE 0.9 % IV SOLN: 100.0000 mL | INTRAVENOUS | Status: DC | PRN

## 2016-06-03 MED ORDER — LIDOCAINE HCL (PF) 1 % IJ SOLN: 5.0000 mL | INTRAMUSCULAR | Status: DC | PRN

## 2016-06-03 MED ORDER — HEPARIN SODIUM (PORCINE) 1000 UNIT/ML DIALYSIS: 20.0000 [IU]/kg | INTRAMUSCULAR | Status: DC | PRN

## 2016-06-03 MED ORDER — ALPRAZOLAM 0.5 MG PO TABS
ORAL_TABLET | ORAL | Status: AC
  Administered 2016-06-03: 0.25 mg via ORAL
  Filled 2016-06-03: qty 1

## 2016-06-03 MED ORDER — LIDOCAINE-PRILOCAINE 2.5-2.5 % EX CREA: 1.0000 "application " | TOPICAL_CREAM | CUTANEOUS | Status: DC | PRN

## 2016-06-03 MED ORDER — DARBEPOETIN ALFA 100 MCG/0.5ML IJ SOSY
PREFILLED_SYRINGE | INTRAMUSCULAR | Status: AC
  Filled 2016-06-03: qty 0.5

## 2016-06-03 NOTE — Procedures (Signed)
Tolerating HD treatment without hemodynamic instability. Icess Bertoni C  

## 2016-06-03 NOTE — Progress Notes (Signed)
Subjective/Complaints:  Nauseated last night after taking one of the medications in the evening. He thinks it was either at 8 or 10 PM.  Review of systems denies any chest pain no shortness of breath, no urinary symptoms, no abd pain, N/V improved today Objective: Vital Signs: Blood pressure 138/61, pulse 80, temperature 98.4 F (36.9 C), temperature source Oral, resp. rate 18, height 6' 1"  (1.854 m), weight 71.4 kg (157 lb 6.5 oz), SpO2 97 %. No results found. Results for orders placed or performed during the hospital encounter of 05/29/16 (from the past 72 hour(s))  CBC with Differential     Status: Abnormal   Collection Time: 06/01/16  4:56 AM  Result Value Ref Range   WBC 4.1 4.0 - 10.5 K/uL   RBC 2.97 (L) 4.22 - 5.81 MIL/uL   Hemoglobin 8.6 (L) 13.0 - 17.0 g/dL    Comment: CONSISTENT WITH PREVIOUS RESULT   HCT 27.2 (L) 39.0 - 52.0 %   MCV 91.6 78.0 - 100.0 fL   MCH 29.0 26.0 - 34.0 pg   MCHC 31.6 30.0 - 36.0 g/dL   RDW 15.2 11.5 - 15.5 %   Platelets 72 (L) 150 - 400 K/uL    Comment: CONSISTENT WITH PREVIOUS RESULT   Neutrophils Relative % 69 %   Neutro Abs 2.8 1.7 - 7.7 K/uL   Lymphocytes Relative 21 %   Lymphs Abs 0.9 0.7 - 4.0 K/uL   Monocytes Relative 11 %   Monocytes Absolute 0.4 0.1 - 1.0 K/uL   Eosinophils Relative 0 %   Eosinophils Absolute 0.0 0.0 - 0.7 K/uL   Basophils Relative 0 %   Basophils Absolute 0.0 0.0 - 0.1 K/uL  Comprehensive metabolic panel     Status: Abnormal   Collection Time: 06/01/16  4:56 AM  Result Value Ref Range   Sodium 132 (L) 135 - 145 mmol/L   Potassium 4.1 3.5 - 5.1 mmol/L   Chloride 97 (L) 101 - 111 mmol/L   CO2 27 22 - 32 mmol/L   Glucose, Bld 80 65 - 99 mg/dL   BUN 23 (H) 6 - 20 mg/dL   Creatinine, Ser 5.02 (H) 0.61 - 1.24 mg/dL   Calcium 8.4 (L) 8.9 - 10.3 mg/dL   Total Protein 7.2 6.5 - 8.1 g/dL   Albumin 2.2 (L) 3.5 - 5.0 g/dL   AST 20 15 - 41 U/L   ALT 20 17 - 63 U/L   Alkaline Phosphatase 104 38 - 126 U/L   Total  Bilirubin 1.6 (H) 0.3 - 1.2 mg/dL   GFR calc non Af Amer 11 (L) >60 mL/min   GFR calc Af Amer 12 (L) >60 mL/min    Comment: (NOTE) The eGFR has been calculated using the CKD EPI equation. This calculation has not been validated in all clinical situations. eGFR's persistently <60 mL/min signify possible Chronic Kidney Disease.    Anion gap 8 5 - 15     HEENT: edentulous  General: No acute distress Mood and affect are appropriate Heart: Regular rate and rhythm no rubs murmurs or extra sounds Lungs: Clear to auscultation, breathing unlabored, no rales or wheezes Abdomen: Positive bowel sounds, soft nontender to palpation, nondistended Extremities: No clubbing, cyanosis, or edema Skin: No evidence of breakdown, no evidence of rash Neurologic: Cranial nerves II through XII intact, motor strength is 5/5 in bilateral deltoid, , tricep, grip, 4 minus left biceps 5 right bicep, 5/5 righthip flexor, knee extensors, ankle dorsiflexor and plantar flexor, 2 minus  left hip flexor, knee extensor, 4 ankle dorsiflexor plantar flexor Sensory exam normal sensation to light touch and proprioception in bilateral upper and lower extremities Cerebellar exam normal finger to nose to finger as well as heel to shin in bilateral upper and lower extremities Musculoskeletal: Full range of motion in all 4 extremities. Ecchymosis and swelling at left hip  Assessment/Plan: 1. Functional deficits secondary to left femur pathological fracture status post IM nail in a patient with newly diagnosed multiple myeloma which require 3+ hours per day of interdisciplinary therapy in a comprehensive inpatient rehab setting. Physiatrist is providing close team supervision and 24 hour management of active medical problems listed below. Physiatrist and rehab team continue to assess barriers to discharge/monitor patient progress toward functional and medical goals. FIM: Function - Bathing Position: Sitting EOB Body parts bathed by  patient: Right arm, Left arm, Chest, Abdomen Body parts bathed by helper: Front perineal area, Buttocks, Back Bathing not applicable: Right upper leg, Left upper leg, Right lower leg, Left lower leg Assist Level: Touching or steadying assistance(Pt > 75%) (Max assist 40% completed by patient)  Function- Upper Body Dressing/Undressing What is the patient wearing?: Button up shirt Button up shirt - Perfomed by patient: Thread/unthread left sleeve, Pull shirt around back, Button/unbutton shirt, Thread/unthread right sleeve Button up shirt - Perfomed by helper: Thread/unthread right sleeve Assist Level: Supervision or verbal cues Function - Lower Body Dressing/Undressing What is the patient wearing?: Shoes Position: Sitting EOB Underwear - Performed by patient: Thread/unthread right underwear leg, Pull underwear up/down, Thread/unthread left underwear leg Underwear - Performed by helper: Thread/unthread left underwear leg Pants- Performed by patient: Thread/unthread right pants leg, Thread/unthread left pants leg, Pull pants up/down Pants- Performed by helper: Thread/unthread left pants leg Shoes - Performed by patient: Don/doff right shoe, Fasten right Shoes - Performed by helper: Fasten left, Don/doff left shoe TED Hose - Performed by helper: Don/doff right TED hose, Don/doff left TED hose Assist for footwear: Partial/moderate assist Assist for lower body dressing: Touching or steadying assistance (Pt > 75%)  Function - Toileting Toileting steps completed by patient: Adjust clothing prior to toileting, Adjust clothing after toileting (did not have bowel/bladder event) Toileting steps completed by helper: Performs perineal hygiene, Adjust clothing after toileting Assist level: Touching or steadying assistance (Pt.75%)  Function - Toilet Transfers Toilet transfer assistive device: Elevated toilet seat/BSC over toilet, Grab bar, Walker Assist level to toilet: Supervision or verbal  cues Assist level from toilet: Supervision or verbal cues  Function - Chair/bed transfer Chair/bed transfer method: Ambulatory Chair/bed transfer assist level: Supervision or verbal cues Chair/bed transfer assistive device: Walker, Armrests Chair/bed transfer details: Verbal cues for sequencing, Verbal cues for technique  Function - Locomotion: Wheelchair Will patient use wheelchair at discharge?: No Max wheelchair distance: 250 Assist Level: Supervision or verbal cues Assist Level: Supervision or verbal cues Assist Level: Supervision or verbal cues Turns around,maneuvers to table,bed, and toilet,negotiates 3% grade,maneuvers on rugs and over doorsills: No Function - Locomotion: Ambulation Assistive device: Walker-rolling Max distance: 215 Assist level: Supervision or verbal cues Assist level: Supervision or verbal cues Assist level: Supervision or verbal cues Walk 150 feet activity did not occur: Safety/medical concerns Assist level: Supervision or verbal cues Walk 10 feet on uneven surfaces activity did not occur: Safety/medical concerns  Function - Comprehension Comprehension: Auditory Comprehension assist level: Follows complex conversation/direction with extra time/assistive device  Function - Expression Expression: Verbal Expression assist level: Expresses complex ideas: With extra time/assistive device  Function - Social Interaction  Social Interaction assist level: Interacts appropriately with others with medication or extra time (anti-anxiety, antidepressant).  Function - Problem Solving Problem solving assist level: Solves complex 90% of the time/cues < 10% of the time  Function - Memory Memory assist level: More than reasonable amount of time Patient normally able to recall (first 3 days only): Current season, Location of own room, Staff names and faces, That he or she is in a hospital  Medical Problem List and Plan: 1.  Debilitation with decreased mobility  secondary to Multiple myeloma/left femur pathologic fracture, Cont CIR PT, OT 2.  DVT Prophylaxis/Anticoagulation: Subcutaneous heparin. Monitor platelet counts and any signs of bleeding, last platelets 70,000 3. Pain Management: (Chronic and post-op) Hydrocodone as needed, discussed palliative radiation with radiation oncology physician assistant, this would not be occurring until about 2 weeks from now if needed. 4. Mood: Xanax 0.25 mg twice daily as needed 5. Neuropsych: This patient is capable of making decisions on his on behalf. 6. Skin/Wound Care: Monitor sacral decub--foam dressing for now. Routine pressure relief measures. Nephro between  Meals.    7. Fluids/Electrolytes/Nutrition:  Monitor I/O. Renal diet with 1200 ccFR., discussed that fruits and vegetables can be brought in from home 8. Multiple myeloma: Started on Velcade 06/08--follow-up per her oncology services. Weekly pulse dose dexamethasone. 9. ESRD: Strict I/O. 1200 cc/FR.  As per nephro,, diet liberalized 10. Volume overload/Hypotension:  Monitor for now.   11. Thrombocytopenia: Progressive drop--Monitor with serial checks and for signs of bleeding.   12. Vitamin B12 deficiency: on supplement 13. Left femur pending pathologic fracture s/p IM nailing; WBAT.  14. constipated. Will Resume miralax and change colace and senna to Daily ,patient overall doing better in this regard 15. Acute on chronic anemia: Monitor. Transfuse if necessary 16. HTN: Monitor and provide prns in accordance with increased physical exertion and pain 17 nausea and vomiting.multifactorial, chemotherapy medication, severe constipation, also may be medication related. He thinks the Mucinex may have made him nauseated. Will discontinue, doesn't have any problems with cough or secretions  LOS (Days) 5 A FACE TO FACE EVALUATION WAS PERFORMED  KIRSTEINS,ANDREW E 06/03/2016, 10:31 AM

## 2016-06-03 NOTE — Progress Notes (Signed)
Occupational Therapy Session Note  Patient Details  Name: Thomas [name] MRN: [id number] Date of Birth: [birth year]  Today's Date: 06/03/2016 OT Individual Time: 0930-1030 and 1300-1348  OT Individual Time Calculation (min): 60 min and 48 min   Short Term Goals: Week 1:  OT Short Term Goal 1 (Week 1): Pt will complete LB dressing with supervision and use of AE as needed OT Short Term Goal 2 (Week 1): Pt will complete 2 grooming tasks in standing at sink with supervision OT Short Term Goal 3 (Week 1): Pt will complete sit > stand froom various height surfaces to increase success with functional transfers OT Short Term Goal 4 (Week 1): Pt will complete toilet transfer to [facility] over toilet with supervision and LRAD  Skilled Therapeutic Interventions/Progress Updates:    1 of 2) Treatment session with focus on self care and standing endurance/tolerance. Pt completed UB and LB bathing using AE at EOB and mod A for L shoe. Pt ambulated approximately 47ft x2 with seated rest break to Dyn[employer] therapy gym. Pt completed x3 rounds on [employer] at standing with RW as support 2:00-2:30 minutes. Challenged pt by incorporating LUE during activity for improved AROM & endurance, and for alternate horizontal rotation. Pt c/o pain in LB and indicated seated rest break requirement. Pt ambulated approximately 69ft back to room then clinician propelled pt remainder of distance to room in [facility]. Pt transferred to recliner with all needs in reach.   2 of 2) Treatment session with focus on improved standing tolerance & endurance, and proprioceptive input to LLE. Pt ambulated approximately 190ft to therapy gym at which pt clinician propelled pt the remaining distance in [place]. Pt completed activities to incorporate dynamic standing postures, weight shifting, unilateral UE, and intermittent use of BUE. Pt dtr included in activities for improved pt and family engagement. Provided daughter with education regarding skilled clinical  and functional insights and means to grade activity to facilitate carryover at home post d/c. Dtr verbalized understanding and discussed options available at home. Pt demonstrated ability to tolerate standing for 1-3 minutes without need for seated rest break. Pt propelled back to room in [facility] by clinician. Pt completed squat pivot transfer to bed with S. Left pt in room with all needs in reach.   Therapy Documentation Precautions:  Precautions Precautions: Fall Restrictions Weight Bearing Restrictions: Yes LLE Weight Bearing: Weight bearing as tolerated General:   Vital Signs:   Pain: Pain Assessment Pain Assessment: Pt c/o pain in LB.  Pain Score: 6-7/10  See Function Navigator for Current Functional Status.   Therapy/Group: Individual Therapy  Dierdre Searles 06/03/2016, 12:21 PM

## 2016-06-03 NOTE — Progress Notes (Signed)
Physical Therapy Session Note  Patient Details  Name: Chibuikem Pall MRN: [id number] Date of Birth: Mar 09, 1946  Today's Date: 06/03/2016 PT Individual Time: 1100-1217 PT Individual Time Calculation (min): 77 min    Skilled Therapeutic Interventions/Progress Updates:  Patient in room , sitting in recliner at the beginning of the session , agrees to therapy intervention, but states he feels sore form a [previous day.  Initiated session with short distance gait with Rw 1 x 40 feet with Supervision. In therapy gym transfer to mat and training in strength and flexibility with multiplanar exercises for B LE and manual and self assisted stretching. Sit to stand with limited  use of UE 2 x 5- decreased ability to bear weight and shift weight to L .   Standing exercises to increase balance, facilitate even weight distribution. Gait training with increased cues for step length and weight acceptance 2 x 60 feet . Increased rest breaks during session in order to recover form fatigue.  Patient propelled his w/c back to his room, with instruction to use B LE both going forward and back ward.  At the end of session patient returned to recliner and left with family eating lunch.  Therapy Documentation Precautions:  Precautions Precautions: Fall Restrictions Weight Bearing Restrictions: Yes LLE Weight Bearing: Weight bearing as tolerated Pain: Pain Assessment Pain Assessment: No/denies pain Pain Score: 0-No pain   See Function Navigator for Current Functional Status.   Therapy/Group: Individual Therapy  Guadlupe Spanish 06/03/2016, 12:54 PM

## 2016-06-04 ENCOUNTER — Inpatient Hospital Stay (HOSPITAL_COMMUNITY): Payer: PPO | Admitting: Occupational Therapy

## 2016-06-04 MED ORDER — CALCIUM ACETATE (PHOS BINDER) 667 MG PO CAPS
667.0000 mg | ORAL_CAPSULE | Freq: Two times a day (BID) | ORAL | Status: DC
  Administered 2016-06-05 – 2016-06-08 (×6): 667 mg via ORAL
  Filled 2016-06-04 (×6): qty 1

## 2016-06-04 NOTE — Progress Notes (Signed)
Subjective/Complaints:  No further nausea. Has pain from heparin injections. Ambulated over 450 feet yesterday.  Review of systems denies any chest pain no shortness of breath, no urinary symptoms, no abd pain, N/V improved today Objective: Vital Signs: Blood pressure 149/63, pulse 76, temperature 98.7 F (37.1 C), temperature source Oral, resp. rate 16, height 6' 1"  (1.854 m), weight 72.2 kg (159 lb 2.8 oz), SpO2 99 %. No results found. Results for orders placed or performed during the hospital encounter of 05/29/16 (from the past 72 hour(s))  Renal function panel     Status: Abnormal   Collection Time: 06/03/16  3:13 PM  Result Value Ref Range   Sodium 133 (L) 135 - 145 mmol/L   Potassium 3.7 3.5 - 5.1 mmol/L   Chloride 99 (L) 101 - 111 mmol/L   CO2 27 22 - 32 mmol/L   Glucose, Bld 138 (H) 65 - 99 mg/dL   BUN 23 (H) 6 - 20 mg/dL   Creatinine, Ser 5.29 (H) 0.61 - 1.24 mg/dL   Calcium 8.1 (L) 8.9 - 10.3 mg/dL   Phosphorus 2.9 2.5 - 4.6 mg/dL   Albumin 2.2 (L) 3.5 - 5.0 g/dL   GFR calc non Af Amer 10 (L) >60 mL/min   GFR calc Af Amer 12 (L) >60 mL/min    Comment: (NOTE) The eGFR has been calculated using the CKD EPI equation. This calculation has not been validated in all clinical situations. eGFR's persistently <60 mL/min signify possible Chronic Kidney Disease.    Anion gap 7 5 - 15  CBC     Status: Abnormal   Collection Time: 06/03/16  3:13 PM  Result Value Ref Range   WBC 4.1 4.0 - 10.5 K/uL   RBC 2.65 (L) 4.22 - 5.81 MIL/uL   Hemoglobin 7.7 (L) 13.0 - 17.0 g/dL   HCT 24.1 (L) 39.0 - 52.0 %   MCV 90.9 78.0 - 100.0 fL   MCH 29.1 26.0 - 34.0 pg   MCHC 32.0 30.0 - 36.0 g/dL   RDW 16.3 (H) 11.5 - 15.5 %   Platelets 106 (L) 150 - 400 K/uL    Comment: CONSISTENT WITH PREVIOUS RESULT     HEENT: edentulous  General: No acute distress Mood and affect are appropriate Heart: Regular rate and rhythm no rubs murmurs or extra sounds Lungs: Clear to auscultation,  breathing unlabored, no rales or wheezes Abdomen: Positive bowel sounds, soft nontender to palpation, nondistended Extremities: No clubbing, cyanosis, or edema Skin: No evidence of breakdown, no evidence of rash Neurologic: Cranial nerves II through XII intact, motor strength is 5/5 in bilateral deltoid, , tricep, grip, 4 minus left biceps 5 right bicep, 5/5 righthip flexor, knee extensors, ankle dorsiflexor and plantar flexor, 2 minus left hip flexor, knee extensor, 4 ankle dorsiflexor plantar flexor Sensory exam normal sensation to light touch and proprioception in bilateral upper and lower extremities Cerebellar exam normal finger to nose to finger as well as heel to shin in bilateral upper and lower extremities Musculoskeletal: Full range of motion in all 4 extremities. Ecchymosis and swelling at left hip  Assessment/Plan: 1. Functional deficits secondary to left femur pathological fracture status post IM nail in a patient with newly diagnosed multiple myeloma which require 3+ hours per day of interdisciplinary therapy in a comprehensive inpatient rehab setting. Physiatrist is providing close team supervision and 24 hour management of active medical problems listed below. Physiatrist and rehab team continue to assess barriers to discharge/monitor patient progress toward functional and  medical goals. FIM: Function - Bathing Position: Sitting EOB Body parts bathed by patient: Right arm, Left arm, Chest, Abdomen, Right lower leg, Left upper leg, Right upper leg, Left lower leg Body parts bathed by helper: Front perineal area, Buttocks, Back Bathing not applicable: Back, Front perineal area, Buttocks Assist Level: Supervision or verbal cues  Function- Upper Body Dressing/Undressing What is the patient wearing?: Button up shirt Button up shirt - Perfomed by patient: Thread/unthread right sleeve, Thread/unthread left sleeve, Pull shirt around back, Button/unbutton shirt Button up shirt -  Perfomed by helper: Thread/unthread right sleeve Assist Level: Set up Set up : To obtain clothing/put away Function - Lower Body Dressing/Undressing What is the patient wearing?: Pants, Shoes Position: Sitting EOB Underwear - Performed by patient: Thread/unthread right underwear leg, Pull underwear up/down, Thread/unthread left underwear leg Underwear - Performed by helper: Thread/unthread left underwear leg Pants- Performed by patient: Thread/unthread right pants leg, Pull pants up/down Pants- Performed by helper: Thread/unthread left pants leg Shoes - Performed by patient: Don/doff right shoe, Fasten right Shoes - Performed by helper: Fasten left, Don/doff left shoe TED Hose - Performed by helper: Don/doff right TED hose, Don/doff left TED hose Assist for footwear: Partial/moderate assist Assist for lower body dressing: Touching or steadying assistance (Pt > 75%) (Mod assist)  Function - Toileting Toileting steps completed by patient: Adjust clothing prior to toileting Toileting steps completed by helper: Performs perineal hygiene, Adjust clothing after toileting Assist level: Touching or steadying assistance (Pt.75%)  Function - Toilet Transfers Toilet transfer assistive device: Elevated toilet seat/BSC over toilet, Grab bar, Walker Assist level to toilet: Supervision or verbal cues Assist level from toilet: Supervision or verbal cues  Function - Chair/bed transfer Chair/bed transfer method: Ambulatory Chair/bed transfer assist level: Supervision or verbal cues Chair/bed transfer assistive device: Walker, Armrests Chair/bed transfer details: Verbal cues for sequencing, Verbal cues for technique  Function - Locomotion: Wheelchair Will patient use wheelchair at discharge?: No Max wheelchair distance: 250 Assist Level: Supervision or verbal cues Assist Level: Supervision or verbal cues Assist Level: Supervision or verbal cues Turns around,maneuvers to table,bed, and  toilet,negotiates 3% grade,maneuvers on rugs and over doorsills: No Function - Locomotion: Ambulation Assistive device: Walker-rolling Max distance: 215 Assist level: Supervision or verbal cues Assist level: Supervision or verbal cues Assist level: Supervision or verbal cues Walk 150 feet activity did not occur: Safety/medical concerns Assist level: Supervision or verbal cues Walk 10 feet on uneven surfaces activity did not occur: Safety/medical concerns  Function - Comprehension Comprehension: Auditory Comprehension assist level: Follows complex conversation/direction with extra time/assistive device  Function - Expression Expression: Verbal Expression assist level: Expresses complex ideas: With extra time/assistive device  Function - Social Interaction Social Interaction assist level: Interacts appropriately with others with medication or extra time (anti-anxiety, antidepressant).  Function - Problem Solving Problem solving assist level: Solves complex 90% of the time/cues < 10% of the time  Function - Memory Memory assist level: More than reasonable amount of time Patient normally able to recall (first 3 days only): Current season, Location of own room, Staff names and faces, That he or she is in a hospital  Medical Problem List and Plan: 1.  Debilitation with decreased mobility secondary to Multiple myeloma/left femur pathologic fracture, Cont CIR PT, OT 2.  DVT Prophylaxis/Anticoagulation: Subcutaneous heparin. Monitor platelet counts and any signs of bleeding, last platelets 106K, based ondiscomfort as well as improved mobility will discontinue heparin. Continue SCD 3. Pain Management: (Chronic and post-op) Hydrocodone as needed, discussed  palliative radiation with radiation oncology physician assistant, this would not be occurring until about 2 weeks from now if needed. 4. Mood: Xanax 0.25 mg twice daily as needed 5. Neuropsych: This patient is capable of making decisions on  his on behalf. 6. Skin/Wound Care: Monitor sacral decub--foam dressing for now. Routine pressure relief measures. Nephro between  Meals.    7. Fluids/Electrolytes/Nutrition:  Monitor I/O. Renal diet with 1200 ccFR., discussed that fruits and vegetables can be brought in from home 8. Multiple myeloma: Started on Velcade 06/08--follow-up per her oncology services. Weekly pulse dose dexamethasone. 9. ESRD: Strict I/O. 1200 cc/FR.  As per nephro,, diet liberalized 10. Volume overload/Hypotension:  Monitor for now.   11. Thrombocytopenia: stable, may be related to underlying illnesses however heparin may be contributing 12. Vitamin B12 deficiency: on supplement 13. Left femur pending pathologic fracture s/p IM nailing; WBAT.  14. constipated. Will Resume miralax and change colace and senna to Daily ,patient overall doing better in this regard 15. Acute on chronic anemia: Monitor. Transfuse if necessary 16. HTN: Monitor and provide prns in accordance with increased physical exertion and pain 17 nausea and vomiting.multifactorial, generally improving LOS (Days) 6 A FACE TO FACE EVALUATION WAS PERFORMED  KIRSTEINS,ANDREW E 06/04/2016, 10:44 AM

## 2016-06-04 NOTE — Progress Notes (Signed)
Occupational Therapy Session Note  Patient Details  Name: Thomas [name] MRN: [id number] Date of Birth: 05/28/1946  Today's Date: 06/04/2016 OT Individual Time: 1100-1158 OT Individual Time Calculation (min): 58 min    Short Term Goals: Week 1:  OT Short Term Goal 1 (Week 1): Pt will complete LB dressing with supervision and use of AE as needed OT Short Term Goal 2 (Week 1): Pt will complete 2 grooming tasks in standing at sink with supervision OT Short Term Goal 3 (Week 1): Pt will complete sit > stand froom various height surfaces to increase success with functional transfers OT Short Term Goal 4 (Week 1): Pt will complete toilet transfer to [facility] over toilet with supervision and LRAD  Skilled Therapeutic Interventions/Progress Updates:    Pt completed UB bathing sitting EOB as well as UB and LB dressing.  Used reacher and shoe funnel for donning left shoe after already being tied.  But he did not need any AE for donning pants over the LLE.  Min assist for sit to stand from the EOB to pull pants over hips.  He completed short distance mobility using the RW with min guard assist for approximately 73ft after standing to pull up his pants.  He needed rest secondary to back pain.  Returned to room via wheelchair with pt transferring back to the recliner chair to rest and eat lunch.  Pt's spouse present for session and supportive.    Therapy Documentation Precautions:  Precautions Precautions: Fall Restrictions Weight Bearing Restrictions: No LLE Weight Bearing: Weight bearing as tolerated  Pain: Pain Assessment Pain Assessment: Faces Pain Score: 0-No pain Faces Pain Scale: Hurts little more Pain Type: Acute pain Pain Location: Back Pain Intervention(s): Repositioned;Ambulation/increased activity ADL: See Function Navigator for Current Functional Status.   Therapy/Group: Individual Therapy  Daelyn Pettaway OTR/L 06/04/2016, 12:58 PM

## 2016-06-04 NOTE — Progress Notes (Signed)
Pt is a 70 y.o. yo male who was admitted on 05/09/2016 with new renal failure in the setting of newly diagnosed myeloma  Assessment/Plan: 1. ESRD w/new diagnosis of myeloma- HD requiring since 5/31- TTS schedule- CLIP done- spot at TTS at [place]- s/p AVF placed on 6/14- has PC as well. HD tuesday 2. Myeloma- new dx- started on velcade per onc on 6/8   Subjective: Interval History:  Tol HD yesterday with no fluid removed  Objective: Vital signs in last 24 hours: Temp:  [98.3 F (36.8 C)-98.9 F (37.2 C)] 98.9 F (37.2 C) (06/25 1456) Pulse Rate:  [66-81] 73 (06/25 1456) Resp:  [16-116] 116 (06/25 1456) BP: (119-149)/(53-64) 143/61 mmHg (06/25 1456) SpO2:  [97 %-100 %] 100 % (06/25 1456) Weight:  [71.3 kg (157 lb 3 oz)-72.2 kg (159 lb 2.8 oz)] 72.2 kg (159 lb 2.8 oz) (06/25 0559) Weight change: 0.2 kg (7.1 oz)  Intake/Output from previous day: 06/24 0701 - 06/25 0700 In: 120 [P.O.:120] Out: 572 [Urine:300] Intake/Output this shift:    General appearance: alert and cooperative Head: Normocephalic, without obvious abnormality, atraumatic Back: negative, symmetric, no curvature. ROM normal. No CVA tenderness. Chest wall: no tenderness Cardio: regular rate and rhythm, S1, S2 normal, no murmur, click, rub or gallop  Lab Results:  Recent Labs  06/03/16 1513  WBC 4.1  HGB 7.7*  HCT 24.1*  PLT 106*   BMET:  Recent Labs  06/03/16 1513  NA 133*  K 3.7  CL 99*  CO2 27  GLUCOSE 138*  BUN 23*  CREATININE 5.29*  CALCIUM 8.1*   No results for input(s): PTH in the last 72 hours. Iron Studies: No results for input(s): IRON, TIBC, TRANSFERRIN, FERRITIN in the last 72 hours. Studies/Results: No results found.  Scheduled: . acyclovir  200 mg Oral BID  . calcium acetate  667 mg Oral TID WC  . darbepoetin (ARANESP) injection - DIALYSIS  100 mcg Intravenous Q Sat-HD  . dexamethasone  40 mg Oral Weekly  . metoCLOPramide  5 mg Oral TID AC  . multivitamin  1 tablet Oral  QHS  . pantoprazole  40 mg Oral Daily  . polyethylene glycol  17 g Oral Daily  . senna-docusate  2 tablet Oral QHS  . vitamin B-12  1,000 mcg Oral Daily  . Vitamin D (Ergocalciferol)  50,000 Units Oral Q7 days       LOS: 6 days   Celester Lech C 06/04/2016,3:48 PM

## 2016-06-05 ENCOUNTER — Inpatient Hospital Stay (HOSPITAL_COMMUNITY): Payer: PPO | Admitting: Physical Therapy

## 2016-06-05 ENCOUNTER — Inpatient Hospital Stay (HOSPITAL_COMMUNITY): Payer: PPO

## 2016-06-05 ENCOUNTER — Other Ambulatory Visit: Payer: Self-pay | Admitting: Hematology and Oncology

## 2016-06-05 ENCOUNTER — Inpatient Hospital Stay (HOSPITAL_COMMUNITY): Payer: PPO | Admitting: Occupational Therapy

## 2016-06-05 DIAGNOSIS — N19 Unspecified kidney failure: Secondary | ICD-10-CM

## 2016-06-05 DIAGNOSIS — E538 Deficiency of other specified B group vitamins: Secondary | ICD-10-CM

## 2016-06-05 DIAGNOSIS — C9000 Multiple myeloma not having achieved remission: Secondary | ICD-10-CM

## 2016-06-05 NOTE — Progress Notes (Signed)
  [facility] Progress Note    Assessment/ Plan:   Pt is a 70 y.o. yo male who was admitted on 05/09/2016 with new renal failure in the setting of newly diagnosed myeloma   1. ESRD w/new diagnosis of myeloma- HD requiring since 5/31- TTS schedule- CLIP done- spot at TTS at [place]- s/p AVF placed on 6/14- has PC as well.  - Last HD was on Sat with no fluid removal - Hold HD for now and reassess in the AM --> UOP incr from 331ml to 723ml/24hrs. 2. Myeloma- new dx- started on velcade per onc on 6/8   Subjective:   Doing well in rehab, although week. Has an appetitie.  Denies f/c/n/v.   Objective:   BP 148/74 mmHg  Pulse 71  Temp(Src) 98.4 F (36.9 C) (Oral)  Resp 16  Ht 6\' 1"  (1.854 m)  Wt 72.2 kg (159 lb 2.8 oz)  BMI 21.00 kg/m2  SpO2 99%  Intake/Output Summary (Last 24 hours) at 06/05/16 1109 Last data filed at 06/04/16 2210  Gross per 24 hour  Intake    120 ml  Output    700 ml  Net   -580 ml   Weight change:   Physical Exam: NAD, supine in bed CTA b/l, poor air movement. RRR, no S3 or S4 SNDNT No lower edema to tr Access: 1st stage BBF --> positive thrill  Imaging: No results found.  Labs: BMET  Recent Labs Lab 05/30/16 1614 06/01/16 0456 06/03/16 1513  NA 130* 132* 133*  K 4.1 4.1 3.7  CL 95* 97* 99*  CO2 26 27 27   GLUCOSE 158* 80 138*  BUN 46* 23* 23*  CREATININE 6.09* 5.02* 5.29*  CALCIUM 8.1* 8.4* 8.1*  PHOS 4.0  --  2.9   CBC  Recent Labs Lab 05/30/16 0518 06/01/16 0456 06/03/16 1513  WBC 3.1* 4.1 4.1  NEUTROABS  --  2.8  --   HGB 8.8* 8.6* 7.7*  HCT 27.3* 27.2* 24.1*  MCV 90.1 91.6 90.9  PLT 55* 72* 106*    Medications:    . acyclovir  200 mg Oral BID  . calcium acetate  667 mg Oral BID WC  . darbepoetin (ARANESP) injection - DIALYSIS  100 mcg Intravenous Q Sat-HD  . dexamethasone  40 mg Oral Weekly  . metoCLOPramide  5 mg Oral TID AC  . multivitamin  1 tablet Oral QHS  . pantoprazole  40 mg Oral Daily   . polyethylene glycol  17 g Oral Daily  . senna-docusate  2 tablet Oral QHS  . vitamin B-12  1,000 mcg Oral Daily  . Vitamin D (Ergocalciferol)  50,000 Units Oral Q7 days      Otelia Santee, MD 06/05/2016, 11:09 AM

## 2016-06-05 NOTE — Progress Notes (Signed)
Occupational Therapy Session Note  Patient Details  Name: Jonluke Jinkins MRN: [id number] Date of Birth: 1946/09/21  Today's Date: 06/05/2016 OT Individual Time:  -   1000-1100  (60 min)      Short Term Goals: Week 1:  OT Short Term Goal 1 (Week 1): Pt will complete LB dressing with supervision and use of AE as needed OT Short Term Goal 2 (Week 1): Pt will complete 2 grooming tasks in standing at sink with supervision OT Short Term Goal 3 (Week 1): Pt will complete sit > stand froom various height surfaces to increase success with functional transfers OT Short Term Goal 4 (Week 1): Pt will complete toilet transfer to [facility] over toilet with supervision and LRAD Week 2:     Skilled Therapeutic Interventions/Progress Updates:   LLE was operated on June 4 with rod insertion  . Pt sitting in recliner.  Stated he just finished with PT.  He reported he walked, did stairs and rode bike and was worn out.  . .  Pt engaged in sit to stand and standing balance with heel/toe strategies.      Pt unable to lift LLE off floor but able to do heel toe with gravity eliminated.  ing  Pt went from recliner to bed with with SBA.  Exercises with LLE in supine with knee bends.  Unable to to straight  leg lifts with LLE. Went from supine to sit with with SBA .  Used reacher and shoe horn for donning Left shoe with no assistance.  Unable to tie and did not want to try adaptive strategies.  He stated he would get slip on shoe when he got home.     Pt's spouse present for session and supportive. Left in recliner with all needs in reach.    Therapy Documentation Precautions:  Precautions Precautions: Fall Restrictions Weight Bearing Restrictions: Yes LLE Weight Bearing: Weight bearing as tolerated    Vital Signs: Therapy Vitals Temp: 98.4 F (36.9 C) Temp Source: Oral Pulse Rate: 71 Resp: 16 BP: (!) 148/74 mmHg Patient Position (if appropriate): Lying Oxygen Therapy SpO2: 99 % O2 Device: Not  Delivered Pain:  LLE 4/10  (hip area)   ADL:   See Function Navigator for Current Functional Status.   Therapy/Group: Individual Therapy  Lisa Roca 06/05/2016, 8:00 AM

## 2016-06-05 NOTE — Progress Notes (Signed)
Physical Therapy Session Note  Patient Details  Name: Thomas [name] MRN: [id number] Date of Birth: [birth year]  Today's Date: 06/05/2016 PT Individual Time: 0900-1000 PT Individual Time Calculation (min): 60 min   Short Term Goals: Week 1:  PT Short Term Goal 1 (Week 1): Pt will perform bed mobility with S using bed features PT Short Term Goal 2 (Week 1): Pt will perform sit <>stand with S PT Short Term Goal 3 (Week 1): Pt will perform bed <>w/c transfer with S PT Short Term Goal 4 (Week 1): Pt will ambulate 150' min guard with LRAD PT Short Term Goal 5 (Week 1): Pt will ascend/descend 2 6" stairs with min guard using RW  Skilled Therapeutic Interventions/Progress Updates:    Pt received supine in bed, denies pain and agreeable to treatment. Does c/o back pain as he is getting up, does not rate, but then reports it "limbers up" as he begins walking. Supine>sit with S and bedrails. Performs upper/lower body dressing at EOB with minA overall; requires assist for TEDs and L shoe, and min guard for balance in standing to don pants. Gait x120' with RW and S. Nustep x8 min for AAROM LLE; flexion ROM assessed after 8 min at 75 degrees. Stairs 2x8 on 3" stairs with S and LLE lead for strengthening and coordination. Gait x100' with RW and S before fatigued and requiring rest break. Gait within room x15' with RW and S to return to recliner. Remained seated in recliner with all needs in reach at completion of session.   Therapy Documentation Precautions:  Precautions Precautions: Fall Restrictions Weight Bearing Restrictions: Yes LLE Weight Bearing: Weight bearing as tolerated   See Function Navigator for Current Functional Status.   Therapy/Group: Individual Therapy  Luberta Mutter 06/05/2016, 10:00 AM

## 2016-06-05 NOTE — Progress Notes (Signed)
Nutrition Follow-up  DOCUMENTATION CODES:   Non-severe (moderate) malnutrition in context of chronic illness  INTERVENTION:  Provide nourishment snacks. (Ordered)  Family to provide snacks from outside.  Encourage adequate PO intake.  NUTRITION DIAGNOSIS:   Malnutrition related to chronic illness as evidenced by moderate depletion of body fat, severe depletion of muscle mass; ongoing  GOAL:   Patient will meet greater than or equal to 90% of their needs; met  MONITOR:   PO intake, Weight trends, Labs, I & O's, Skin  REASON FOR ASSESSMENT:   Consult Assessment of nutrition requirement/status  ASSESSMENT:   70 y.o. (no medical follow up) with onset of back pain March 2017 treated with NSAIDS and conservative care. He was admitted via ED 5/31 with nausea due to uremia, renal failure with BUN/Cr- 61/10.5 and anemia. Dr. Tayo Footman consulted and HD initiated. Bone scan done due to complaints to severe back pain and showed multiple bony lesions suspicious for MM--largest in left iliac wing, L3 compression fracture and question L5 compression fracture, and left mid shaft lesion with thinning at risk for pathologic fracture. He underwent IM fixation of left femur on 06/4 by Dr. Lyla Glassing. WBAT LLE with spica wrap left hip. 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 ABLA treated with transfusion and thrombocytopenia being monitored.   Pt reports eating well since diet has been switched to a regular diet. Meal completion has been 50-100% with most intake at 100%. Wife at bedside reports they have been conscience of picking foods that are more renal friendly on the menu. Pt encouraged to eat his food at meals.   Labs and medications reviewed.   Diet Order:  Diet regular Room service appropriate?: Yes; Fluid consistency:: Thin  Skin:  Wound (see comment) (Stage II pressure ulcer on coccyx.)  Last BM:  6/23  Height:   Ht Readings from  Last 1 Encounters:  05/29/16 6' 1" (1.854 m)    Weight:   Wt Readings from Last 1 Encounters:  06/04/16 159 lb 2.8 oz (72.2 kg)    Ideal Body Weight:  83.6 kg  BMI:  Body mass index is 21 kg/(m^2).  Estimated Nutritional Needs:   Kcal:  2050-2250  Protein:  100-110 grams  Fluid:  1.2 L/day  EDUCATION NEEDS:   Education needs addressed  Corrin Parker, MS, RD, LDN Pager # [phone] After hours/ weekend pager # [phone]

## 2016-06-05 NOTE — Progress Notes (Signed)
Thomas [name]   DOB:1946-02-05   GY#:[id number]    Subjective: he is doing very well. Not symptomatic from anemia. Eating everything brought to him. No nausea or constipation. Doing well on therapy. Denies pain  Objective:  Filed Vitals:   06/05/16 0439 06/05/16 1500  BP: 148/74 135/66  Pulse: 71 81  Temp: 98.4 F (36.9 C) 98.1 F (36.7 C)  Resp: 16 17     Intake/Output Summary (Last 24 hours) at 06/05/16 1705 Last data filed at 06/05/16 1500  Gross per 24 hour  Intake    240 ml  Output    300 ml  Net    -60 ml    GENERAL:alert, no distress and comfortable SKIN: skin color, texture, turgor are normal, no rashes or significant lesions EYES: normal, Conjunctiva are pink and non-injected, sclera clear Musculoskeletal:no cyanosis of digits and no clubbing  NEURO: alert & oriented x 3 with fluent speech, no focal motor/sensory deficits   Labs:  Lab Results  Component Value Date   WBC 4.1 06/03/2016   HGB 7.7* 06/03/2016   HCT 24.1* 06/03/2016   MCV 90.9 06/03/2016   PLT 106* 06/03/2016   NEUTROABS 2.8 06/01/2016    Lab Results  Component Value Date   NA 133* 06/03/2016   K 3.7 06/03/2016   CL 99* 06/03/2016   CO2 27 06/03/2016    Assessment & Plan:   IgG kappa multiple myeloma The patient presented with significant bone lesions, renal failure and pancytopenia.He was started on weekly pulse dose dexamethasone 40 mg on 05/18/16. He was started on Velcade subcutaneous injections on days 1,4,8,11 on 05/18/16. I tried to avoid weekend injection. He had delay of Day 11 injection due to pancytopenia. Cycle 2 of treatment will begin on 06/12/2016 in the out-patient. I will avoid IV bisphosphonates with his renal failure for now.  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 There is no contraindication for him to receive dexamethasone or Velcade while on hemodialysis as they are not renally cleared Hopefully, his hemodialysis treatment can be transitioned to outpatient treatment  Significant bone lesions throughout He had prophylactic surgery to the left femur. MRI shows significant lesion in the lumbar spine. He was seen by radiation oncologist with plan for radiation therapy in a palliative fashion to the femur and the lumbar spine, non-urgent His pain is currently well controlled with current pain medication regimen.  Severe pancytopenia, vitamin B-12 deficiency Multifactorial, likely due to multiple myeloma and severe vitamin B-12 deficiency and renal failure I agreed for him to receive blood transfusion to keep hemoglobin greater than 7, He is receiving Aranesp through hemodialysis I have added one injection of vitamin B 12 on 05/18/16 & 05/25/16. He will continue high-dose supplement daily by mouth OK to proceed with treatment as long as platelet count is >50,000  Severe constipation, resolved  Abdomen x-ray confirmed moderate stool burden Multifactorial likely due to dehydration and medication side effects This has resolved with aggressive laxative therapy  Severe vitamin D deficiency I started him on high-dose replacement therapy, he will get high dose vitamin D every Thursday  Severe protein calorie malnutrition Continue dietitian consult and frequent small meals He has poor oral intake due to limited choice of food. I have changed into regular diet for now.  Severe nausea and vomiting, resolved This is postprandial in nature, could be related to recent constipation or gastritis. I have added protonix. I will schedule Reglan 3 times a day and will reassess next week   DVT prophylaxis I recommend heparin SQ as long as platelet count is >50,000  Discharge planning Hopefully end of the week Hopefully, we can transition his future  treatment to outpatient Will sign off. He has appointment to see me in the clinic on Monday 06/12/16.  [facility], Thomas Schlauch, MD 06/05/2016  5:05 PM

## 2016-06-05 NOTE — Progress Notes (Signed)
Subjective/Complaints:  Patient overall feels well today. Has been participating in his therapies.  Review of systems denies any chest pain no shortness of breath, no urinary symptoms, no abd pain, N/V improved today Objective: Vital Signs: Blood pressure 135/66, pulse 81, temperature 98.1 F (36.7 C), temperature source Oral, resp. rate 17, height 6' 1" (1.854 m), weight 72.2 kg (159 lb 2.8 oz), SpO2 100 %. No results found. Results for orders placed or performed during the hospital encounter of 05/29/16 (from the past 72 hour(s))  Renal function panel     Status: Abnormal   Collection Time: 06/03/16  3:13 PM  Result Value Ref Range   Sodium 133 (L) 135 - 145 mmol/L   Potassium 3.7 3.5 - 5.1 mmol/L   Chloride 99 (L) 101 - 111 mmol/L   CO2 27 22 - 32 mmol/L   Glucose, Bld 138 (H) 65 - 99 mg/dL   BUN 23 (H) 6 - 20 mg/dL   Creatinine, Ser 5.29 (H) 0.61 - 1.24 mg/dL   Calcium 8.1 (L) 8.9 - 10.3 mg/dL   Phosphorus 2.9 2.5 - 4.6 mg/dL   Albumin 2.2 (L) 3.5 - 5.0 g/dL   GFR calc non Af Amer 10 (L) >60 mL/min   GFR calc Af Amer 12 (L) >60 mL/min    Comment: (NOTE) The eGFR has been calculated using the CKD EPI equation. This calculation has not been validated in all clinical situations. eGFR's persistently <60 mL/min signify possible Chronic Kidney Disease.    Anion gap 7 5 - 15  CBC     Status: Abnormal   Collection Time: 06/03/16  3:13 PM  Result Value Ref Range   WBC 4.1 4.0 - 10.5 K/uL   RBC 2.65 (L) 4.22 - 5.81 MIL/uL   Hemoglobin 7.7 (L) 13.0 - 17.0 g/dL   HCT 24.1 (L) 39.0 - 52.0 %   MCV 90.9 78.0 - 100.0 fL   MCH 29.1 26.0 - 34.0 pg   MCHC 32.0 30.0 - 36.0 g/dL   RDW 16.3 (H) 11.5 - 15.5 %   Platelets 106 (L) 150 - 400 K/uL    Comment: CONSISTENT WITH PREVIOUS RESULT     HEENT: edentulous  General: No acute distress Mood and affect are appropriate Heart: Regular rate and rhythm no rubs murmurs or extra sounds Lungs: Clear to auscultation, breathing unlabored,  no rales or wheezes Abdomen: Positive bowel sounds, soft nontender to palpation, nondistended Extremities: No clubbing, cyanosis, or edema Skin: No evidence of breakdown, no evidence of rash Neurologic: Cranial nerves II through XII intact, motor strength is 5/5 in bilateral deltoid, , tricep, grip, 4 minus left biceps 5 right bicep, 5/5 righthip flexor, knee extensors, ankle dorsiflexor and plantar flexor, 2 minus left hip flexor, knee extensor, 4 ankle dorsiflexor plantar flexor Sensory exam normal sensation to light touch and proprioception in bilateral upper and lower extremities Cerebellar exam normal finger to nose to finger as well as heel to shin in bilateral upper and lower extremities Musculoskeletal: Full range of motion in all 4 extremities. Ecchymosis and swelling at left hip  Assessment/Plan: 1. Functional deficits secondary to left femur pathological fracture status post IM nail in a patient with newly diagnosed multiple myeloma which require 3+ hours per day of interdisciplinary therapy in a comprehensive inpatient rehab setting. Physiatrist is providing close team supervision and 24 hour management of active medical problems listed below. Physiatrist and rehab team continue to assess barriers to discharge/monitor patient progress toward functional and medical goals.  FIM: Function - Bathing Position: Sitting EOB Body parts bathed by patient: Right arm, Left arm, Chest, Abdomen, Front perineal area Body parts bathed by helper: Back Bathing not applicable: Front perineal area, Buttocks, Right upper leg, Left upper leg, Right lower leg, Left lower leg (Did not attempt secondary to wife assisting with this last night and pt did not feel it needed to be done again) Assist Level: Supervision or verbal cues  Function- Upper Body Dressing/Undressing What is the patient wearing?: Button up shirt Button up shirt - Perfomed by patient: Thread/unthread right sleeve, Thread/unthread left  sleeve, Pull shirt around back, Button/unbutton shirt Button up shirt - Perfomed by helper: Thread/unthread right sleeve Assist Level: Set up Set up : To obtain clothing/put away Function - Lower Body Dressing/Undressing What is the patient wearing?: Pants, Shoes, Ted Hose Position: Sitting EOB Underwear - Performed by patient: Thread/unthread right underwear leg, Pull underwear up/down, Thread/unthread left underwear leg Underwear - Performed by helper: Thread/unthread left underwear leg Pants- Performed by patient: Thread/unthread right pants leg, Pull pants up/down Pants- Performed by helper: Thread/unthread left pants leg Shoes - Performed by patient: Don/doff right shoe, Don/doff left shoe, Fasten right, Fasten left Shoes - Performed by helper: Fasten left, Don/doff left shoe TED Hose - Performed by helper: Don/doff left TED hose, Don/doff right TED hose Assist for footwear: Partial/moderate assist Assist for lower body dressing: Touching or steadying assistance (Pt > 75%)  Function - Toileting Toileting steps completed by patient: Adjust clothing prior to toileting, Performs perineal hygiene, Adjust clothing after toileting Toileting steps completed by helper: Performs perineal hygiene, Adjust clothing after toileting Assist level: Touching or steadying assistance (Pt.75%)  Function - Toilet Transfers Toilet transfer assistive device: Walker Assist level to toilet: Supervision or verbal cues Assist level from toilet: Supervision or verbal cues  Function - Chair/bed transfer Chair/bed transfer method: Ambulatory Chair/bed transfer assist level: Supervision or verbal cues Chair/bed transfer assistive device: Walker, Armrests Chair/bed transfer details: Verbal cues for sequencing, Verbal cues for technique  Function - Locomotion: Wheelchair Will patient use wheelchair at discharge?: No Max wheelchair distance: 150 Assist Level: Supervision or verbal cues Assist Level:  Supervision or verbal cues Assist Level: Supervision or verbal cues Turns around,maneuvers to table,bed, and toilet,negotiates 3% grade,maneuvers on rugs and over doorsills: No Function - Locomotion: Ambulation Assistive device: Walker-rolling Max distance: 120 Assist level: Supervision or verbal cues Assist level: Supervision or verbal cues Assist level: Supervision or verbal cues Walk 150 feet activity did not occur: Safety/medical concerns Assist level: Supervision or verbal cues Walk 10 feet on uneven surfaces activity did not occur: Safety/medical concerns  Function - Comprehension Comprehension: Auditory Comprehension assist level: Follows complex conversation/direction with extra time/assistive device  Function - Expression Expression: Verbal Expression assist level: Expresses complex ideas: With extra time/assistive device  Function - Social Interaction Social Interaction assist level: Interacts appropriately with others with medication or extra time (anti-anxiety, antidepressant).  Function - Problem Solving Problem solving assist level: Solves complex 90% of the time/cues < 10% of the time  Function - Memory Memory assist level: Complete Independence: No helper Patient normally able to recall (first 3 days only): Current season, Location of own room, Staff names and faces, That he or she is in a hospital  Medical Problem List and Plan: 1.  Debilitation with decreased mobility secondary to Multiple myeloma/left femur pathologic fracture, Cont CIR PT, OT 2.  DVT Prophylaxis/Anticoagulation:  Continue SCD 3. Pain Management: (Chronic and post-op) Hydrocodone as needed, discussed palliative  radiation with radiation oncology physician assistant, this would not be occurring until about 2 weeks from now if needed. 4. Mood: Xanax 0.25 mg twice daily as needed 5. Neuropsych: This patient is capable of making decisions on his on behalf. 6. Skin/Wound Care: Monitor sacral  decub--foam dressing for now. Routine pressure relief measures. Nephro between  Meals.    7. Fluids/Electrolytes/Nutrition:  Monitor I/O. Renal diet with 1200 ccFR., discussed that fruits and vegetables can be brought in from home 8. Multiple myeloma: Started on Velcade 06/08--follow-up per her oncology services. Weekly pulse dose dexamethasone.Will restart after discharge 9. ESRD: Strict I/O. 1200 cc/FR.  As per nephro,, diet liberalized 10. Volume overload/Hypotension:  Monitor for now.   11. Thrombocytopenia: stable, may be related to underlying illnesses however heparin may be contributing 12. Vitamin B12 deficiency: on supplement 13. Left femur pending pathologic fracture s/p IM nailing; WBAT.  14. constipated. Will Resume miralax and change colace and senna to Daily ,patient overall doing better in this regard 15. Acute on chronic anemia: Monitor. Transfuse if necessary 16. HTN: Monitor and provide prns in accordance with increased physical exertion and pain 17 nausea and vomiting.multifactorial, generally improving LOS (Days) 7 A FACE TO FACE EVALUATION WAS PERFORMED  KIRSTEINS,ANDREW E 06/05/2016, 4:54 PM

## 2016-06-05 NOTE — Progress Notes (Signed)
Physical Therapy Session Note  Patient Details  Name: Thomas [name] MRN: [id number] Date of Birth: 04/30/1946  Today's Date: 06/05/2016 PT Individual Time: 1430-1530 PT Individual Time Calculation (min): 60 min   Short Term Goals: Week 1:  PT Short Term Goal 1 (Week 1): Pt will perform bed mobility with S using bed features PT Short Term Goal 2 (Week 1): Pt will perform sit <>stand with S PT Short Term Goal 3 (Week 1): Pt will perform bed <>w/c transfer with S PT Short Term Goal 4 (Week 1): Pt will ambulate 150' min guard with LRAD PT Short Term Goal 5 (Week 1): Pt will ascend/descend 2 6" stairs with min guard using RW  Skilled Therapeutic Interventions/Progress Updates:   Pt received in recliner with family present; pt denies pain at rest but asking questions about blood work and blood levels; questions deferred to MD.  Pt agreeable to therapy with family present to observe and begin hands on training.  Pt performed transfer sit > stand from recliner with RW and min A.  Performed ambulation with RW x 120' with min A with pt fatiguing quickly, continued to gym in w/c.  Reviewed and educated pt on safe one step negotiation with RW for home entry/exit; pt return demonstrated safe sequence with therapist and then with wife providing min A.  Also discussed transportation at D/C.  Pt states he and his wife have a tall SUV with running board.  Set up car simulator to SUV height with block to simulate running board. Pt performed transfer w/c > SUV stand pivot with RW and min A with pt ascending with LLE despite verbal cues to lead with RLE and required assistance to bring LLE fully into and out of car.  Recommending that pt perform actual SUV transfer with family prior to D/C; wife reports SUV stays in the parking deck each day.  Pt also reporting that his family can install ramps on each step to enter his house; performed ramp negotiation x 2 reps with RW and min A with verbal cues to maintain safe distance  to RW.  Discussed height of bed at home; mat set up to pt's bed height at home.  Pt demonstrated safe w/c <> bed transfer stand pivot with RW and supine <> sit on flat mat, no rails with supervision.  Returned to room and pt returned to recliner to rest with min A.    Therapy Documentation Precautions:  Precautions Precautions: Fall Restrictions Weight Bearing Restrictions: Yes LLE Weight Bearing: Weight bearing as tolerated  See Function Navigator for Current Functional Status.   Therapy/Group: Individual Therapy  Raylene Everts [facility] 06/05/2016, 8:22 PM

## 2016-06-06 ENCOUNTER — Inpatient Hospital Stay (HOSPITAL_COMMUNITY): Payer: PPO | Admitting: Occupational Therapy

## 2016-06-06 ENCOUNTER — Inpatient Hospital Stay (HOSPITAL_COMMUNITY): Payer: PPO | Admitting: Physical Therapy

## 2016-06-06 LAB — RENAL FUNCTION PANEL
ANION GAP: 7 (ref 5–15)
Albumin: 2.3 g/dL — ABNORMAL LOW (ref 3.5–5.0)
BUN: 27 mg/dL — ABNORMAL HIGH (ref 6–20)
CHLORIDE: 99 mmol/L — AB (ref 101–111)
CO2: 24 mmol/L (ref 22–32)
CREATININE: 5.57 mg/dL — AB (ref 0.61–1.24)
Calcium: 8 mg/dL — ABNORMAL LOW (ref 8.9–10.3)
GFR calc non Af Amer: 9 mL/min — ABNORMAL LOW (ref >60)
GFR, EST AFRICAN AMERICAN: 11 mL/min — AB (ref >60)
GLUCOSE: 115 mg/dL — AB (ref 65–99)
Phosphorus: 2.4 mg/dL — ABNORMAL LOW (ref 2.5–4.6)
Potassium: 4.2 mmol/L (ref 3.5–5.1)
Sodium: 130 mmol/L — ABNORMAL LOW (ref 135–145)

## 2016-06-06 LAB — CBC
HCT: 25.7 % — ABNORMAL LOW (ref 39.0–52.0)
HEMOGLOBIN: 8.2 g/dL — AB (ref 13.0–17.0)
MCH: 29.1 pg (ref 26.0–34.0)
MCHC: 31.9 g/dL (ref 30.0–36.0)
MCV: 91.1 fL (ref 78.0–100.0)
PLATELETS: 107 10*3/uL — AB (ref 150–400)
RBC: 2.82 MIL/uL — AB (ref 4.22–5.81)
RDW: 16.8 % — ABNORMAL HIGH (ref 11.5–15.5)
WBC: 3.6 10*3/uL — AB (ref 4.0–10.5)

## 2016-06-06 LAB — BASIC METABOLIC PANEL
Anion gap: 8 (ref 5–15)
BUN: 23 mg/dL — AB (ref 6–20)
CO2: 27 mmol/L (ref 22–32)
CREATININE: 5.47 mg/dL — AB (ref 0.61–1.24)
Calcium: 8.5 mg/dL — ABNORMAL LOW (ref 8.9–10.3)
Chloride: 98 mmol/L — ABNORMAL LOW (ref 101–111)
GFR calc Af Amer: 11 mL/min — ABNORMAL LOW (ref >60)
GFR, EST NON AFRICAN AMERICAN: 10 mL/min — AB (ref >60)
GLUCOSE: 93 mg/dL (ref 65–99)
POTASSIUM: 4.3 mmol/L (ref 3.5–5.1)
Sodium: 133 mmol/L — ABNORMAL LOW (ref 135–145)

## 2016-06-06 MED ORDER — PENTAFLUOROPROP-TETRAFLUOROETH EX AERO: 1.0000 "application " | INHALATION_SPRAY | CUTANEOUS | Status: DC | PRN

## 2016-06-06 MED ORDER — SODIUM CHLORIDE 0.9 % IV SOLN: 100.0000 mL | INTRAVENOUS | Status: DC | PRN

## 2016-06-06 MED ORDER — HEPARIN SODIUM (PORCINE) 1000 UNIT/ML DIALYSIS
1000.0000 [IU] | INTRAMUSCULAR | Status: DC | PRN
  Filled 2016-06-06: qty 1

## 2016-06-06 MED ORDER — LIDOCAINE HCL (PF) 1 % IJ SOLN
5.0000 mL | INTRAMUSCULAR | Status: DC | PRN
  Filled 2016-06-06: qty 5

## 2016-06-06 MED ORDER — LIDOCAINE-PRILOCAINE 2.5-2.5 % EX CREA
1.0000 "application " | TOPICAL_CREAM | CUTANEOUS | Status: DC | PRN
  Filled 2016-06-06: qty 5

## 2016-06-06 MED ORDER — ALPRAZOLAM 0.5 MG PO TABS
ORAL_TABLET | ORAL | Status: AC
  Administered 2016-06-06: 0.25 mg via ORAL
  Filled 2016-06-06: qty 1

## 2016-06-06 MED ORDER — ALTEPLASE 2 MG IJ SOLR: 2.0000 mg | Freq: Once | INTRAMUSCULAR | Status: DC | PRN

## 2016-06-06 MED ORDER — HEPARIN SODIUM (PORCINE) 1000 UNIT/ML DIALYSIS
2500.0000 [IU] | Freq: Once | INTRAMUSCULAR | Status: DC
  Filled 2016-06-06: qty 3

## 2016-06-06 NOTE — Progress Notes (Addendum)
Subjective/Complaints:  Patient without nausea or vomiting today. Bowels are moving better. Appreciate nephrology note.  Review of systems denies any chest pain no shortness of breath, no urinary symptoms, no abd pain, N/V improved today Objective: Vital Signs: Blood pressure 129/64, pulse 71, temperature 98.6 F (37 C), temperature source Oral, resp. rate 16, height _0  (1.854 m), weight 73.1 kg (161 lb 2.5 oz), SpO2 100 %. No results found. Results for orders placed or performed during the hospital encounter of 05/29/16 (from the past 72 hour(s))  Basic metabolic panel     Status: Abnormal   Collection Time: 06/06/16  6:32 AM  Result Value Ref Range   Sodium 133 (L) 135 - 145 mmol/L   Potassium 4.3 3.5 - 5.1 mmol/L   Chloride 98 (L) 101 - 111 mmol/L   CO2 27 22 - 32 mmol/L   Glucose, Bld 93 65 - 99 mg/dL   BUN 23 (H) 6 - 20 mg/dL   Creatinine, Ser 5.47 (H) 0.61 - 1.24 mg/dL   Calcium 8.5 (L) 8.9 - 10.3 mg/dL   GFR calc non Af Amer 10 (L) >60 mL/min   GFR calc Af Amer 11 (L) >60 mL/min    Comment: (NOTE) The eGFR has been calculated using the CKD EPI equation. This calculation has not been validated in all clinical situations. eGFR's persistently <60 mL/min signify possible Chronic Kidney Disease.    Anion gap 8 5 - 15  Renal function panel     Status: Abnormal   Collection Time: 06/06/16  4:00 PM  Result Value Ref Range   Sodium 130 (L) 135 - 145 mmol/L   Potassium 4.2 3.5 - 5.1 mmol/L   Chloride 99 (L) 101 - 111 mmol/L   CO2 24 22 - 32 mmol/L   Glucose, Bld 115 (H) 65 - 99 mg/dL   BUN 27 (H) 6 - 20 mg/dL   Creatinine, Ser 5.57 (H) 0.61 - 1.24 mg/dL   Calcium 8.0 (L) 8.9 - 10.3 mg/dL   Phosphorus 2.4 (L) 2.5 - 4.6 mg/dL   Albumin 2.3 (L) 3.5 - 5.0 g/dL   GFR calc non Af Amer 9 (L) >60 mL/min   GFR calc Af Amer 11 (L) >60 mL/min    Comment: (NOTE) The eGFR has been calculated using the CKD EPI equation. This calculation has not been validated in all clinical  situations. eGFR's persistently <60 mL/min signify possible Chronic Kidney Disease.    Anion gap 7 5 - 15  CBC     Status: Abnormal   Collection Time: 06/06/16  4:00 PM  Result Value Ref Range   WBC 3.6 (L) 4.0 - 10.5 K/uL   RBC 2.82 (L) 4.22 - 5.81 MIL/uL   Hemoglobin 8.2 (L) 13.0 - 17.0 g/dL   HCT 25.7 (L) 39.0 - 52.0 %   MCV 91.1 78.0 - 100.0 fL   MCH 29.1 26.0 - 34.0 pg   MCHC 31.9 30.0 - 36.0 g/dL   RDW 16.8 (H) 11.5 - 15.5 %   Platelets 107 (L) 150 - 400 K/uL    Comment: CONSISTENT WITH PREVIOUS RESULT     HEENT: edentulous  General: No acute distress Mood and affect are appropriate Heart: Regular rate and rhythm no rubs murmurs or extra sounds Lungs: Clear to auscultation, breathing unlabored, no rales or wheezes Abdomen: Positive bowel sounds, soft nontender to palpation, nondistended Extremities: No clubbing, cyanosis, or edema Skin: No evidence of breakdown, no evidence of rash Neurologic: Cranial nerves II through  XII intact, motor strength is 5/5 in bilateral deltoid, , tricep, grip, 4 minus left biceps 5 right bicep, 5/5 righthip flexor, knee extensors, ankle dorsiflexor and plantar flexor, 2 minus left hip flexor, knee extensor, 4 ankle dorsiflexor plantar flexor Sensory exam normal sensation to light touch and proprioception in bilateral upper and lower extremities Cerebellar exam normal finger to nose to finger as well as heel to shin in bilateral upper and lower extremities Musculoskeletal: Full range of motion in all 4 extremities. Ecchymosis and swelling at left hip  Assessment/Plan: 1. Functional deficits secondary to left femur pathological fracture status post IM nail in a patient with newly diagnosed multiple myeloma which require 3+ hours per day of interdisciplinary therapy in a comprehensive inpatient rehab setting. Physiatrist is providing close team supervision and 24 hour management of active medical problems listed below. Physiatrist and rehab team  continue to assess barriers to discharge/monitor patient progress toward functional and medical goals. FIM: Function - Bathing Position: Sitting EOB Body parts bathed by patient: Right arm, Left arm, Chest, Abdomen, Front perineal area Body parts bathed by helper: Back Bathing not applicable: Front perineal area, Buttocks, Right upper leg, Left upper leg, Right lower leg, Left lower leg (Did not attempt secondary to wife assisting with this last night and pt did not feel it needed to be done again) Assist Level: Supervision or verbal cues  Function- Upper Body Dressing/Undressing What is the patient wearing?: Button up shirt Button up shirt - Perfomed by patient: Thread/unthread right sleeve, Thread/unthread left sleeve, Pull shirt around back, Button/unbutton shirt Button up shirt - Perfomed by helper: Thread/unthread right sleeve Assist Level: Set up Set up : To obtain clothing/put away Function - Lower Body Dressing/Undressing What is the patient wearing?: Pants, Shoes, Ted Hose Position: Sitting EOB Underwear - Performed by patient: Thread/unthread right underwear leg, Pull underwear up/down, Thread/unthread left underwear leg Underwear - Performed by helper: Thread/unthread left underwear leg Pants- Performed by patient: Thread/unthread right pants leg, Pull pants up/down Pants- Performed by helper: Thread/unthread left pants leg Shoes - Performed by patient: Don/doff left shoe, Don/doff right shoe Shoes - Performed by helper: Fasten right, Fasten left TED Hose - Performed by helper: Don/doff left TED hose, Don/doff right TED hose Assist for footwear: Partial/moderate assist Assist for lower body dressing: Touching or steadying assistance (Pt > 75%)  Function - Toileting Toileting steps completed by patient: Adjust clothing prior to toileting, Performs perineal hygiene, Adjust clothing after toileting Toileting steps completed by helper: Performs perineal hygiene, Adjust clothing  after toileting Assist level: Touching or steadying assistance (Pt.75%)  Function - Air cabin crew transfer assistive device: Walker Assist level to toilet: Supervision or verbal cues Assist level from toilet: Supervision or verbal cues  Function - Chair/bed transfer Chair/bed transfer method: Ambulatory Chair/bed transfer assist level: Supervision or verbal cues Chair/bed transfer assistive device: Walker Chair/bed transfer details: Verbal cues for sequencing, Verbal cues for technique  Function - Locomotion: Wheelchair Will patient use wheelchair at discharge?: No Max wheelchair distance: 150 Assist Level: Supervision or verbal cues Assist Level: Supervision or verbal cues Assist Level: Supervision or verbal cues Turns around,maneuvers to table,bed, and toilet,negotiates 3% grade,maneuvers on rugs and over doorsills: No Function - Locomotion: Ambulation Assistive device: Walker-rolling Max distance: 225 Assist level: Supervision or verbal cues Assist level: Supervision or verbal cues Assist level: Supervision or verbal cues Walk 150 feet activity did not occur: Safety/medical concerns Assist level: Supervision or verbal cues Walk 10 feet on uneven surfaces activity  did not occur: Safety/medical concerns  Function - Comprehension Comprehension: Auditory Comprehension assist level: Follows complex conversation/direction with extra time/assistive device  Function - Expression Expression: Verbal Expression assist level: Expresses complex ideas: With extra time/assistive device  Function - Social Interaction Social Interaction assist level: Interacts appropriately with others with medication or extra time (anti-anxiety, antidepressant).  Function - Problem Solving Problem solving assist level: Solves basic 90% of the time/requires cueing < 10% of the time  Function - Memory Memory assist level: Complete Independence: No helper Patient normally able to recall  (first 3 days only): Current season, Location of own room, Staff names and faces, That he or she is in a hospital  Medical Problem List and Plan: 1.  Debilitation with decreased mobility secondary to Multiple myeloma/left femur pathologic fracture, Cont CIR PT, OT, team conference in a.m. 2.  DVT Prophylaxis/Anticoagulation:  Continue SCD 3. Pain Management: (Chronic and post-op) Hydrocodone as needed, discussed palliative radiation with radiation oncology physician assistant, this would not be occurring until about 2 weeks from now if needed. 4. Mood: Xanax 0.25 mg twice daily as needed 5. Neuropsych: This patient is capable of making decisions on his on behalf. 6. Skin/Wound Care: Monitor sacral decub--foam dressing for now. Routine pressure relief measures. Nephro between  Meals.    7. Fluids/Electrolytes/Nutrition:  Monitor I/O. Renal diet with 1200 ccFR., discussed that fruits and vegetables can be brought in from home 8. Multiple myeloma: Started on Velcade 06/08--follow-up per her oncology services. Weekly pulse dose dexamethasone.Will restart after discharge 9. ESRD: Strict I/O. 1200 cc/FR.  As per nephro, Will likely need hemodialysis post discharge 10. Volume overload/Hypotension:  Monitor for now.   11. Thrombocytopenia: stable, may be related to underlying illnesses however heparin may be contributing 12. Vitamin B12 deficiency: on supplement 13. Left femur pending pathologic fracture s/p IM nailing; WBAT.  14. constipated. Will Resume miralax and change colace and senna to Daily ,patient overall doing better in this regard 15. Acute on chronic anemia: Monitor. Transfuse if necessary 16. HTN: Monitor and provide prns in accordance with increased physical exertion and pain 17 nausea and vomiting.multifactorial, generally improving LOS (Days) 8 A FACE TO FACE EVALUATION WAS PERFORMED  Haruto Demaria E 06/06/2016, 5:15 PM

## 2016-06-06 NOTE — Progress Notes (Signed)
  [facility] Progress Note    Assessment/ Plan:   Pt is a 70 y.o. yo male who was admitted on 05/09/2016 with new renal failure in the setting of newly diagnosed myeloma   1. ESRD w/new diagnosis of myeloma- HD requiring since 5/31- TTS schedule- CLIP done- spot at TTS at [place]- s/p AVF placed on 6/14- has PC as well. - patient prefers to be at [facility] which is closer to his home --> spoke with Judi and she will work on that. - Last HD was on Sat with no fluid removal but UOP dropping and cr still rising --> will hd today with no UF and then reassess over the next 2 days. - Most likely will have to continue dialysis as an outpt. 2. Myeloma- new dx- started on velcade per onc on 6/8   Subjective:   Doing well in rehab, although week. Has an appetitie.  Denies f/c/n/v. He would like to drink more which i think is reasonable.   Objective:   BP 150/69 mmHg  Pulse 79  Temp(Src) 98.4 F (36.9 C) (Oral)  Resp 16  Ht 6\' 1"  (1.854 m)  Wt 72.2 kg (159 lb 2.8 oz)  BMI 21.00 kg/m2  SpO2 99%  Intake/Output Summary (Last 24 hours) at 06/06/16 0835 Last data filed at 06/06/16 [id number]  Gross per 24 hour  Intake    240 ml  Output    550 ml  Net   -310 ml   Weight change:   Physical Exam: NAD, supine in bed CTA b/l, poor air movement. RRR, no S3 or S4 SNDNT No lower edema to tr Access: 1st stage BBF --> positive thrill, RIJ TC  Imaging: No results found.  Labs: BMET  Recent Labs Lab 05/30/16 1614 06/01/16 0456 06/03/16 1513 06/06/16 0632  NA 130* 132* 133* 133*  K 4.1 4.1 3.7 4.3  CL 95* 97* 99* 98*  CO2 26 27 27 27   GLUCOSE 158* 80 138* 93  BUN 46* 23* 23* 23*  CREATININE 6.09* 5.02* 5.29* 5.47*  CALCIUM 8.1* 8.4* 8.1* 8.5*  PHOS 4.0  --  2.9  --    CBC  Recent Labs Lab 06/01/16 0456 06/03/16 1513  WBC 4.1 4.1  NEUTROABS 2.8  --   HGB 8.6* 7.7*  HCT 27.2* 24.1*  MCV 91.6 90.9  PLT 72* 106*    Medications:    . acyclovir  200  mg Oral BID  . calcium acetate  667 mg Oral BID WC  . darbepoetin (ARANESP) injection - DIALYSIS  100 mcg Intravenous Q Sat-HD  . dexamethasone  40 mg Oral Weekly  . metoCLOPramide  5 mg Oral TID AC  . multivitamin  1 tablet Oral QHS  . pantoprazole  40 mg Oral Daily  . polyethylene glycol  17 g Oral Daily  . senna-docusate  2 tablet Oral QHS  . vitamin B-12  1,000 mcg Oral Daily  . Vitamin D (Ergocalciferol)  50,000 Units Oral Q7 days      Otelia Santee, MD 06/06/2016, 8:35 AM

## 2016-06-06 NOTE — Progress Notes (Signed)
Physical Therapy Session Note  Patient Details  Name: Thomas [name] MRN: [id number] Date of Birth: 1946/09/24  Today's Date: 06/06/2016 PT Individual Time: 1015-1100 and 1500-1530 PT Individual Time Calculation (min): 45 min and 30 min (total 75 min)    Short Term Goals: Week 1:  PT Short Term Goal 1 (Week 1): Pt will perform bed mobility with S using bed features PT Short Term Goal 2 (Week 1): Pt will perform sit <>stand with S PT Short Term Goal 3 (Week 1): Pt will perform bed <>w/c transfer with S PT Short Term Goal 4 (Week 1): Pt will ambulate 150' min guard with LRAD PT Short Term Goal 5 (Week 1): Pt will ascend/descend 2 6" stairs with min guard using RW  Skilled Therapeutic Interventions/Progress Updates:    Tx 1: Pt received in rehab gym with handoff from rehab tech after pt ambulating x125' with Rw and S before requiring rest break. Nustep x8 min for LLE AAROM to improve knee flexion ROM; after 8 min knee flexion 76 degrees. Supine straight leg raise 2x10 reps, AROM RLE/AAROM LLE. Gait to return to room x225' with RW and S. Daughter demonstrated transfers providing min guard for sit <>stand and ambulation with RW; updated safety plan for daughter cleared to perform transfers. Remained seated in recliner at completion of session, all needs in reach.   Tx 2: Pt received seated in recliner, denies pain and agreeable to treatment. Gait x15' recliner>w/c with RW and S. Transported to hospital entrance to practice car transfer [place] for energy conservation. Transfer w/c >car with RW and S. Initially required mod verbal cues and minA to problem solve getting into car due to pt initially attempting to enter car facing into the car; cued to back up to seat and use RLE on running board, BUEs on handle and car frame to boost hips into car. Therapist performed assist on first trial, wife assisted on second trial. Required blocking at L knee as RLE boosts into car to prevent sliding, and assist to  negotiate LLE into vehicle with limited knee flexion ROM and pain. Returned to room as above; remained seated in w/c at completion of session, all needs within reach.   Therapy Documentation Precautions:  Precautions Precautions: Fall Restrictions Weight Bearing Restrictions: Yes LLE Weight Bearing: Weight bearing as tolerated   See Function Navigator for Current Functional Status.   Therapy/Group: Individual Therapy  Luberta Mutter 06/06/2016, 3:45 PM

## 2016-06-06 NOTE — Progress Notes (Signed)
Recreational Therapy Session Note  Patient Details  Name: Thomas [name] MRN: [id number] Date of Birth: 27-May-1946 Today's Date: 06/06/2016  Met with pt and family to discuss leisure interests & community pursuits.  Education provided on remaining active and engaged in activities of choice post discharge. Pt is extremely motivated to participate in leisure activities as he is able.  Education also provided to family on potential modification/adaptations.  All stated understanding.  No further TR as pt is scheduled for discharge 6/29.  [place] 06/06/2016, 3:14 PM

## 2016-06-06 NOTE — Progress Notes (Signed)
Physical Therapy Session Note  Patient Details  Name: Thomas [name] MRN: [id number] Date of Birth: 1946/02/18  Today's Date: 06/06/2016 PT Individual Time: 1300-1400 PT Individual Time Calculation (min): 60 min   Short Term Goals: Week 1:  PT Short Term Goal 1 (Week 1): Pt will perform bed mobility with S using bed features PT Short Term Goal 2 (Week 1): Pt will perform sit <>stand with S PT Short Term Goal 3 (Week 1): Pt will perform bed <>w/c transfer with S PT Short Term Goal 4 (Week 1): Pt will ambulate 150' min guard with LRAD PT Short Term Goal 5 (Week 1): Pt will ascend/descend 2 6" stairs with min guard using RW  Skilled Therapeutic Interventions/Progress Updates:   Pt received in recliner; pt reports family was checked off to assist with transfers in the room this am.  Pt and wife demonstrated safe sit > stand from recliner with RW and supervision.  Pt reporting fatigue this pm and requesting to walk only part of the way to the gym to conserve energy for actual car transfer this pm.  Pt performed gait x 120' with RW with continued decreased stance time LLE, decrease step length RLE, decreased L DF, increased trunk flexion and decreased LLE extension in stance.  In gym pt engaged in LLE stretches in standing to facilitate increased ankle DF, knee and hip extension: pt stood with L foot on wedge with cues for knee extension and forward translation of COG over L foot for terminal hip extension x 3 reps x 30 seconds.  Pt able to sense stretch in gastroc and quad/hip flexor.  Performed gait sequence, weight shift, foot clearance, LE strengthening and balance training with pt performing forwards, retro and lateral step overs low obstacle (walking pole on floor) with UE support on RW and // bars with cues for L hip and knee flexion for full L foot clearance over pole, heel strike and full anterior weight shift over LLE with hip and knee extension.  Pt returned to room in w/c due to fatigue.  Pt left  in w/c to await final PT session with family present.    Therapy Documentation Precautions:  Precautions Precautions: Fall Restrictions Weight Bearing Restrictions: Yes LLE Weight Bearing: Weight bearing as tolerated Vital Signs: Therapy Vitals Temp: 98.6 F (37 C) Temp Source: Oral Pulse Rate: 73 Resp: 16 BP: 129/65 mmHg Patient Position (if appropriate): Sitting Oxygen Therapy SpO2: 100 % O2 Device: Not Delivered Pain: Pain Assessment Pain Assessment: No/denies pain  See Function Navigator for Current Functional Status.   Therapy/Group: Individual Therapy  Raylene Everts [facility] 06/06/2016, 4:41 PM

## 2016-06-06 NOTE — Progress Notes (Signed)
06/06/2016 3:30 PM Hemodialysis Outpatient Note; Mr. Torbeck requested a change of dialysis clinics, from [place] to the [facility] dialysis center. His schedule will be Tuesday, Thursday and Saturday 2nd shift. [facility] can begin treatment this Thursday June 29th at 11:00 AM. If the patient needs to start treatment on Saturday then please have him visit the center on Friday at 11:00 AM to sign paperwork and consents. Thank you. Gordy Savers

## 2016-06-06 NOTE — Progress Notes (Signed)
Dialysis treatment completed.  500 mL ultrafiltrated.  0 mL net fluid removal.  Patient status unchanged. Lung sounds clear to ausculation in all fields. No edema. Cardiac: NSR.  Cleansed RIJ catheter with chlorhexidine.  Disconnected lines and flushed ports with saline per protocol.  Ports locked with heparin and capped per protocol.    Report given to bedside, RN Verdis Frederickson.

## 2016-06-06 NOTE — Progress Notes (Signed)
Occupational Therapy Session Note  Patient Details  Name: Thomas [name] MRN: [id number] Date of Birth: 27-Jun-1946  Today's Date: 06/06/2016 OT Individual Time: 0920-1005 OT Individual Time Calculation (min): 45 min   Short Term Goals: Week 1:  OT Short Term Goal 1 (Week 1): Pt will complete LB dressing with supervision and use of AE as needed OT Short Term Goal 2 (Week 1): Pt will complete 2 grooming tasks in standing at sink with supervision OT Short Term Goal 3 (Week 1): Pt will complete sit > stand froom various height surfaces to increase success with functional transfers OT Short Term Goal 4 (Week 1): Pt will complete toilet transfer to [facility] over toilet with supervision and LRAD  Skilled Therapeutic Interventions/Progress Updates:  Skilled intervention focusing on pt & family education, sit to/from stands, functional ambulation/mobility using RW, and overall activity tolerance/endurance. Patient found supine in bed with no complaints of pain, monitored this during session. Pt engaged in bed mobility and completed UB/LB dressing in sit to/from stand position using RW. Therapist assisted with Lt shoe only, pt independent using reacher/sock aid combo device and family plans to purchase one. From here, patient's wife assisted pt with stand pivot transfer EOB to [facility], then wife assisted pt EOB to BSC seated over toilet seat (pt ambulating with RW). Checked patient's wife on toilet transfers (BSC transfers and ambulating to/from BR). Pt then stood at sink for grooming task of washing face. Pt engaged in functional ambulation around room with RW and therapist educated pt and family on safety with RW functional mobility. Pt sat in recliner and wife assisted with hair care using shampoo cap. At end of session, left pt seated in recliner with all needs within reach and family present.   Therapy Documentation Precautions:  Precautions Precautions: Fall Restrictions Weight Bearing Restrictions: Yes LLE  Weight Bearing: Weight bearing as tolerated  Vital Signs: Therapy Vitals Temp: 98.4 F (36.9 C) Temp Source: Oral Pulse Rate: 79 Resp: 16 BP: (!) 150/69 mmHg Patient Position (if appropriate): Sitting Oxygen Therapy SpO2: 99 % O2 Device: Not Delivered  See Function Navigator for Current Functional Status.  Therapy/Group: Individual Therapy  Thomas [name] , MS, OTR/L, CLT  06/06/2016, 10:06 AM

## 2016-06-06 NOTE — Progress Notes (Signed)
Patient arrived to unit by bed.  Reviewed treatment plan and this RN agrees with plan.  Report received from bedside RN, Jiles Garter.  Consent verified.  Patient A & O X 4.   Lung sounds diminished to ausculation in all fields. No edema. Cardiac:  NSR.  Removed caps and cleansed RIJ catheter with chlorhedxidine.  Aspirated ports of heparin and flushed them with saline per protocol.  Connected and secured lines, initiated treatment at 1600.  UF Goal of 570mL and net fluid removal 0L.  Will continue to monitor.

## 2016-06-07 ENCOUNTER — Inpatient Hospital Stay (HOSPITAL_COMMUNITY): Payer: PPO | Admitting: Physical Therapy

## 2016-06-07 ENCOUNTER — Inpatient Hospital Stay (HOSPITAL_COMMUNITY): Payer: PPO | Admitting: Occupational Therapy

## 2016-06-07 NOTE — Discharge Summary (Signed)
NAMEMARQUETTE, BLODGETT                ACCOUNT NO.:  [id number]  MEDICAL RECORD NO.:  [id number]  LOCATION:  4W02C                        FACILITY:  [facility]  PHYSICIAN:  Charlett Blake, M.D.DATE OF BIRTH:  02/14/1946  DATE OF ADMISSION:  05/29/2016 DATE OF DISCHARGE:  06/08/2016                              DISCHARGE SUMMARY   DISCHARGE DIAGNOSES: 1. Debilitation with decreased mobility secondary to multiple myeloma     with left femur pathologic fracture. 2. SCDs for DVT prophylaxis. 3. Pain management. 4. Anxiety. 5. Multiple myeloma. 6. End-stage renal disease. 7. Thrombocytopenia. 8. Vitamin B12 deficiency. 9. Left femur pathologic fracture. 10.Constipation. 11.Acute on chronic anemia. 12.Hypertension.  HISTORY OF PRESENT ILLNESS:  This is a 70 year old, right-handed male with onset of back pain in March of 2017, treated with nonsteroidal anti- inflammatories and conservative care.  He was admitted through the ED on May 10, 2016, with nausea due to uremia, renal failure, BUN 61, creatinine 10.5, Dr. Minh Footman was consulted, Renal Services, hemodialysis initiated.  A bone scan done due to complaints of severe back pain, showed multiple bony lesions suspicious for multiple myeloma, largest and left iliac wing, L3 compression fracture, question L5 compression fracture, and left mid shaft lesion with thinning at risk for pathologic fracture.  Underwent intramedullary fixation of left femur on May 14, 2016, per Dr. Lyla Glassing.  Weightbearing as tolerated.  A bone biopsy consistent with multiple myeloma, recent renal biopsy of myeloma cast nephropathy.  Acute blood loss anemia, thrombocytopenia, and monitored.  Dr. Tammi Klippel consulted, recommended palliative radiation to spine, left femur.  Dr. Alvy Bimler consulted for treatment options and the patient was started on Velcade injection days 1, 4, 8, and 11 on May 18, 2016.  He was to have weekly pulse Decadron 40 mg.  Dr.  Bridgett Larsson consulted for placement of left AVF graft.  Physical and occupational therapy ongoing.  The patient was admitted for comprehensive rehab program.  PAST MEDICAL HISTORY:  See discharge diagnoses.  SOCIAL HISTORY:  Lives with spouse, independent prior to admission. Functional status upon admission to rehab services was minimal assist, ambulate 35 feet rolling walker; minimal assist for stand pivot transfers; min to mod assist for activities of daily living.  PHYSICAL EXAMINATION:  VITAL SIGNS:  Blood pressure 133/52, pulse 70, temperature 97, and respirations 18. GENERAL:  This was an alert male, in no acute distress.  Oriented to person, place, and time. LUNGS:  Clear to auscultation without wheeze. CARDIAC:  Regular rate and rhythm without murmur. ABDOMEN:  Soft, nontender.  Good bowel sounds. Left hip incision clean and dry.  REHABILITATION HOSPITAL COURSE:  The patient was admitted to inpatient rehab services with therapies initiated on a 3-hour daily basis, consisting of physical therapy, occupational therapy, and rehabilitation nursing.  The following issues were addressed during the patient's rehabilitation stay.  Pertaining to Mr. Binford multiple myeloma and left femur pathologic fracture, he was weightbearing as tolerated, follow up with Orthopedic Services.  In regard to multiple myeloma, Velcade is recommended per Oncology Services as well as planned palliative radiation per Radiation Oncology.  Weekly pulse dexamethasone as advised.  End-stage renal disease, hemodialysis ongoing post discharge per Renal Services.  Blood  pressures remained well controlled, no orthostatic changes.  Acute on chronic anemia, remained asymptomatic, follow up CBC with dialysis.  He remained on Aranesp.  Bouts of constipation resolved with laxative assistance.  The patient received weekly collaborative interdisciplinary team conferences to discuss estimated length of stay, family  teaching, any barriers to his discharge.  The patient performed ambulation of 120 feet with rolling walker and standby assist.  In the gymnasium, he engaged in lower extremity stretching and energy conservation techniques.  Demonstrated transfers provided minimal assist for sit to stand using his rolling walker.  Transfers wheelchair to car with rolling walker at supervision. Activities of daily living and homemaking, overall working with activity tolerance and endurance, engaged in bed mobility, completed upper and lower body dressing in sitting position, therapist did assist with issues.  Overall, family teaching completed and plan discharge to home.  DISCHARGE MEDICATIONS: 1. Zovirax 200 mg p.o. b.i.d. 2. Xanax 0.25 mg p.o. b.i.d. as needed. 3. PhosLo 667 mg p.o. b.i.d. 4. Decadron 40 mg p.o. weekly. 5. Robaxin 250 mg p.o. every 6 hours as needed muscle spasms. 6. Reglan 5 mg p.o. t.i.d. before meals. 7. Rena-Vite 1 tablet p.o. daily at bedtime. 8. Oxycodone 1-2 tablets every 6 hours as needed pain, dispense of 90     tablets. 9. Protonix 40 mg p.o. daily. 10.MiraLAX daily, hold for loose stools. 11.Senokot-S 2 tablets at bedtime. 12.Vitamin B12 at 1000 mcg p.o. daily. 13.Vitamin D 50,000 units p.o. every 7 days.  DIET:  His diet was a regular consistency.  FOLLOWUP:  He would follow up with Dr. Alvy Bimler, call for appointment; Dr. Alysia Penna, as needed; Dr. Rod Can, Orthopedic Services, 2 weeks call for appointment; Dr. Tyler Pita, call for appointment in relation to palliative radiation; Dr. Jeneen Rinks [place] for ongoing hemodialysis.     Lauraine Rinne, P.A.   ______________________________ Charlett Blake, M.D.    DA/MEDQ  D:  06/07/2016  T:  06/07/2016  Job:  [id number]  cc:   Lora Paula, M.D. Heath Lark, MD Rod Can, MD Joyice Faster. [place], M.D.

## 2016-06-07 NOTE — Progress Notes (Signed)
Social Work Patient ID: Thomas [name], male   DOB: 10-10-1946, 70 y.o.   MRN: [id number] Met with pt, wife and daughter to inform team conference meeting goals and readiness for discharge tomorrow. Pt and family feel he is ready and he is looking forward to going home. All feel comfortable with his care and glad to be going home. Agreeable to equipment and follow up therapies. See in am if any other questions.

## 2016-06-07 NOTE — Patient Care Conference (Signed)
Inpatient RehabilitationTeam Conference and Plan of Care Update Date: 06/07/2016   Time: 11:30 AM    Patient Name: Thomas [name]      Medical Record Number: [id number]  Date of Birth: 06-19-1946 Sex: Male         Room/Bed: HD02C/HD02C Payor Info: Payor: [employer] / Plan: [employer] / Product Type: *No Product type* /    Admitting Diagnosis: Femur FX  Admit Date/Time:  05/29/2016  3:19 PM Admission Comments: No comment available   Primary Diagnosis:  <principal problem not specified> Principal Problem: <principal problem not specified>  Patient Active Problem List   Diagnosis Date Noted  . 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 Physical deconditioning   . Closed fracture of shaft of left femur ([place])   . Adjustment disorder with mixed anxiety and depressed mood   . Postoperative pain   . Bilateral low back pain without sciatica   . Pressure ulcer   . Thrombocytopenia ([place])   . Arterial hypotension   . Functional diarrhea   . Anemia of chronic disease   . Multiple myeloma not having achieved remission ([place])   . Antineoplastic chemotherapy induced pancytopenia ([place])   . Low back pain   . Post-op pain   . Surgery, elective   . Acute blood loss anemia   . ESRD on dialysis ([place])   . Essential hypertension   . Urinary tract infection, site not specified   . Vitamin B12 deficiency   . Multiple myeloma ([place]) 05/14/2016  . Acute renal failure ([place])   . Pathological fracture of left femur due to neoplastic disease ([place])   . Anemia due to bone marrow failure ([place])   . 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 Occult blood positive stool 05/10/2016  . Malnutrition of moderate degree 05/10/2016    Expected Discharge Date: Expected Discharge Date: 06/08/16  Team Members Present: Physician leading conference: Dr. Alysia Penna Social Worker Present: Ovidio Kin, LCSW Nurse Present: Junius Creamer, RN PT Present: Kem Parkinson, PT OT Present: Willeen Cass, OT SLP Present: Windell Moulding, SLP PPS Coordinator present : Daiva Nakayama, RN, CRRN     Current Status/Progress Goal Weekly Team Focus  Medical   Pt without severe nausea, requires HD  Home with appropriate MD follow ups  D/C planning   Bowel/Bladder   Continent for Bowel/Bladder. Oliguric. LBM 6/27 per report  Patient to remain continent of Bowel/Bladder with min assist  Monitor patient Bowel/Bladder need q shift and as needed   Swallow/Nutrition/ Hydration             ADL's     supervision UE B & D and min LE B & D   supervision/min assist level   activity tolerance-family education  Mobility   modI transfers, S gait in home/community with RW  mod I transfers, S gait in home and community with LRAD  LLE ROM, activity tolerance, gait training, stairs   Communication             Safety/Cognition/ Behavioral Observations            Pain   No complain of pain  <3  Assess and treat pain q shift and as needed   Skin   Left hip surgical wound dressing  Patient skin to be free form new skin breakdown/infection  Monitor and treat any changes in skin integrity q shift and as needed      *See Care Plan and progress notes for long and short-term goals.  Barriers to Discharge: None     Possible Resolutions to Barriers:  D/C in am    Discharge Planning/Teaching Needs:  Family here daily attending therapies with pt and learning his care      Team Discussion:  Making progress toward his goals-supervision-mod/i level. Appetite better. Family education completed and ready for discharge tomorrow. Follow up with Oncology. Medically stable for DC tomorrow.  Revisions to Treatment Plan:  DC Tomorrow   Continued Need for Acute Rehabilitation Level of Care: The patient requires daily medical management by a physician with specialized training in physical medicine and rehabilitation for the following conditions: Daily analysis of laboratory values and/or radiology reports with any  subsequent need for medication adjustment of medical intervention for : Post surgical problems;Nutritional problems  Aleysha Meckler, [place] 06/08/2016, 8:33 AM

## 2016-06-07 NOTE — Discharge Summary (Signed)
Discharge summary job [id number]

## 2016-06-07 NOTE — Discharge Instructions (Signed)
Inpatient Rehab Discharge Instructions  Adeel Loud Discharge date and time: No discharge date for patient encounter.   Activities/Precautions/ Functional Status: Activity: activity as tolerated Diet: regular diet Wound Care: keep wound clean and dry Functional status:  ___ No restrictions     ___ Walk up steps independently ___ 24/7 supervision/assistance   ___ Walk up steps with assistance ___ Intermittent supervision/assistance  ___ Bathe/dress independently ___ Walk with walker     _x__ Bathe/dress with assistance ___ Walk Independently    ___ Shower independently ___ Walk with assistance    ___ Shower with assistance ___ No alcohol     ___ Return to work/school ________  Special Instructions:  Continue dialysis as directed  Continue velcade injections as per oncology services   COMMUNITY REFERRALS UPON DISCHARGE:    Home Health:   PT, OT, RN   [place]   Date of last service:06/08/2016  Medical Equipment/Items [place]   [phone]   GENERAL COMMUNITY RESOURCES FOR PATIENT/FAMILY: Support [place]  My questions have been answered and I understand these instructions. I will adhere to these goals and the provided educational materials after my discharge from the hospital.  Patient/Caregiver Signature _______________________________ Date __________  Clinician Signature _______________________________________ Date __________  Please bring this form and your medication list with you to all your follow-up doctor's appointments.

## 2016-06-07 NOTE — Progress Notes (Signed)
Subjective/Complaints:  Patient is looking forward to discharge  Review of systems denies any chest pain no shortness of breath, no urinary symptoms, no abd pain, N/V improved today Objective: Vital Signs: Blood pressure 139/65, pulse 85, temperature 98.8 F (37.1 C), temperature source Oral, resp. rate 16, height 6' 1"  (1.854 m), weight 76.6 kg (168 lb 14 oz), SpO2 100 %. No results found. Results for orders placed or performed during the hospital encounter of 05/29/16 (from the past 72 hour(s))  Basic metabolic panel     Status: Abnormal   Collection Time: 06/06/16  6:32 AM  Result Value Ref Range   Sodium 133 (L) 135 - 145 mmol/L   Potassium 4.3 3.5 - 5.1 mmol/L   Chloride 98 (L) 101 - 111 mmol/L   CO2 27 22 - 32 mmol/L   Glucose, Bld 93 65 - 99 mg/dL   BUN 23 (H) 6 - 20 mg/dL   Creatinine, Ser 5.47 (H) 0.61 - 1.24 mg/dL   Calcium 8.5 (L) 8.9 - 10.3 mg/dL   GFR calc non Af Amer 10 (L) >60 mL/min   GFR calc Af Amer 11 (L) >60 mL/min    Comment: (NOTE) The eGFR has been calculated using the CKD EPI equation. This calculation has not been validated in all clinical situations. eGFR's persistently <60 mL/min signify possible Chronic Kidney Disease.    Anion gap 8 5 - 15  Renal function panel     Status: Abnormal   Collection Time: 06/06/16  4:00 PM  Result Value Ref Range   Sodium 130 (L) 135 - 145 mmol/L   Potassium 4.2 3.5 - 5.1 mmol/L   Chloride 99 (L) 101 - 111 mmol/L   CO2 24 22 - 32 mmol/L   Glucose, Bld 115 (H) 65 - 99 mg/dL   BUN 27 (H) 6 - 20 mg/dL   Creatinine, Ser 5.57 (H) 0.61 - 1.24 mg/dL   Calcium 8.0 (L) 8.9 - 10.3 mg/dL   Phosphorus 2.4 (L) 2.5 - 4.6 mg/dL   Albumin 2.3 (L) 3.5 - 5.0 g/dL   GFR calc non Af Amer 9 (L) >60 mL/min   GFR calc Af Amer 11 (L) >60 mL/min    Comment: (NOTE) The eGFR has been calculated using the CKD EPI equation. This calculation has not been validated in all clinical situations. eGFR's persistently <60 mL/min signify  possible Chronic Kidney Disease.    Anion gap 7 5 - 15  CBC     Status: Abnormal   Collection Time: 06/06/16  4:00 PM  Result Value Ref Range   WBC 3.6 (L) 4.0 - 10.5 K/uL   RBC 2.82 (L) 4.22 - 5.81 MIL/uL   Hemoglobin 8.2 (L) 13.0 - 17.0 g/dL   HCT 25.7 (L) 39.0 - 52.0 %   MCV 91.1 78.0 - 100.0 fL   MCH 29.1 26.0 - 34.0 pg   MCHC 31.9 30.0 - 36.0 g/dL   RDW 16.8 (H) 11.5 - 15.5 %   Platelets 107 (L) 150 - 400 K/uL    Comment: CONSISTENT WITH PREVIOUS RESULT     HEENT: edentulous  General: No acute distress Mood and affect are appropriate Heart: Regular rate and rhythm no rubs murmurs or extra sounds Lungs: Clear to auscultation, breathing unlabored, no rales or wheezes Abdomen: Positive bowel sounds, soft nontender to palpation, nondistended Extremities: No clubbing, cyanosis, or edema Skin: No evidence of breakdown, no evidence of rash Neurologic: Cranial nerves II through XII intact, motor strength is 5/5 in  bilateral deltoid, , tricep, grip, 4 minus left biceps 5 right bicep, 5/5 righthip flexor, knee extensors, ankle dorsiflexor and plantar flexor, 2 minus left hip flexor, knee extensor, 4 ankle dorsiflexor plantar flexor Sensory exam normal sensation to light touch and proprioception in bilateral upper and lower extremities Cerebellar exam normal finger to nose to finger as well as heel to shin in bilateral upper and lower extremities Musculoskeletal: Full range of motion in all 4 extremities. Ecchymosis and swelling at left hip  Assessment/Plan: 1. Functional deficits secondary to left femur pathological fracture status post IM nail Stable for D/C today F/u PCP in 3-4 weeks F/u oncology Follow-up nephrology See D/C summary See D/C instructions Function - Bathing Position: Sitting EOB Body parts bathed by patient: Right arm, Left arm, Chest, Abdomen, Front perineal area Body parts bathed by helper: Back Bathing not applicable: Front perineal area, Buttocks, Right  upper leg, Left upper leg, Right lower leg, Left lower leg (Did not attempt secondary to wife assisting with this last night and pt did not feel it needed to be done again) Assist Level: Supervision or verbal cues  Function- Upper Body Dressing/Undressing What is the patient wearing?: Button up shirt Button up shirt - Perfomed by patient: Thread/unthread right sleeve, Thread/unthread left sleeve, Pull shirt around back, Button/unbutton shirt Button up shirt - Perfomed by helper: Thread/unthread right sleeve Assist Level: Set up Set up : To obtain clothing/put away Function - Lower Body Dressing/Undressing What is the patient wearing?: Pants, Shoes, Ted Hose Position: Sitting EOB Underwear - Performed by patient: Thread/unthread right underwear leg, Pull underwear up/down, Thread/unthread left underwear leg Underwear - Performed by helper: Thread/unthread left underwear leg Pants- Performed by patient: Thread/unthread right pants leg, Pull pants up/down Pants- Performed by helper: Thread/unthread left pants leg Shoes - Performed by patient: Don/doff left shoe, Don/doff right shoe Shoes - Performed by helper: Fasten right, Fasten left TED Hose - Performed by helper: Don/doff left TED hose, Don/doff right TED hose Assist for footwear: Partial/moderate assist Assist for lower body dressing: Touching or steadying assistance (Pt > 75%)  Function - Toileting Toileting steps completed by patient: Adjust clothing prior to toileting, Performs perineal hygiene, Adjust clothing after toileting Toileting steps completed by helper: Performs perineal hygiene, Adjust clothing after toileting Assist level: Touching or steadying assistance (Pt.75%)  Function - Toilet Transfers Toilet transfer assistive device: Walker Assist level to toilet: Supervision or verbal cues Assist level from toilet: Supervision or verbal cues  Function - Chair/bed transfer Chair/bed transfer method: Ambulatory Chair/bed  transfer assist level: Supervision or verbal cues Chair/bed transfer assistive device: Walker Chair/bed transfer details: Verbal cues for sequencing, Verbal cues for technique  Function - Locomotion: Wheelchair Will patient use wheelchair at discharge?: No Max wheelchair distance: 150 Assist Level: Supervision or verbal cues Assist Level: Supervision or verbal cues Assist Level: Supervision or verbal cues Turns around,maneuvers to table,bed, and toilet,negotiates 3% grade,maneuvers on rugs and over doorsills: No Function - Locomotion: Ambulation Assistive device: Walker-rolling Max distance: 225 Assist level: Supervision or verbal cues Assist level: Supervision or verbal cues Assist level: Supervision or verbal cues Walk 150 feet activity did not occur: Safety/medical concerns Assist level: Supervision or verbal cues Walk 10 feet on uneven surfaces activity did not occur: Safety/medical concerns  Function - Comprehension Comprehension: Auditory Comprehension assist level: Follows complex conversation/direction with extra time/assistive device  Function - Expression Expression: Verbal Expression assist level: Expresses complex ideas: With extra time/assistive device  Function - Social Interaction Social Interaction assist  level: Interacts appropriately 90% of the time - Needs monitoring or encouragement for participation or interaction.  Function - Problem Solving Problem solving assist level: Solves complex 90% of the time/cues < 10% of the time  Function - Memory Memory assist level: Recognizes or recalls 90% of the time/requires cueing < 10% of the time Patient normally able to recall (first 3 days only): Current season  Medical Problem List and Plan: 1.  Debilitation with decreased mobility secondary to Multiple myeloma/left femur pathologic fracture, DC home today. 2.  DVT Prophylaxis/Anticoagulation:  Discontinue SCD 3. Pain Management: (Chronic and post-op) Hydrocodone  as needed, discussed palliative radiation with radiation oncology physician assistant, this would not be occurring until about 2 weeks from now if needed. 4. Mood: Xanax 0.25 mg twice daily as needed 5. Neuropsych: This patient is capable of making decisions on his on behalf. 6. Skin/Wound Care: Monitor sacral decub--foam dressing for now. Routine pressure relief measures. Nephro between  Meals.    7. Fluids/Electrolytes/Nutrition:  Monitor I/O. Renal diet with 1200 ccFR., discussed that fruits and vegetables can be brought in from home 8. Multiple myeloma: Started on Velcade 06/08--follow-up per her oncology services. Weekly pulse dose dexamethasone.follow-up oncology 9. ESRD: Strict I/O. 1200 cc/FR.  As per nephro, dialysis set up post discharge 10. Volume overload/Hypotension:  Monitor for now.   11. Thrombocytopenia: stable, may be related to underlying illnesses however heparin may be contributing 12. Vitamin B12 deficiency: on supplement 13. Left femur pending pathologic fracture s/p IM nailing; WBAT.  14. constipated. Will Resume miralax and change colace and senna to Daily ,patient overall doing better in this regard 15. Acute on chronic anemia: Monitor. Transfuse if necessary 16. HTN: Monitor and provide prns in accordance with increased physical exertion and pain 17 nausea and vomiting.multifactorial, one episode yesterday LOS (Days) 9 A FACE TO FACE EVALUATION WAS PERFORMED  Cherita Hebel E 06/07/2016, 9:18 AM

## 2016-06-07 NOTE — Progress Notes (Addendum)
Occupational Therapy Discharge Summary  Patient Details  Name: Thomas [name] MRN: [id number] Date of Birth: 07-31-1946  Today's Date: 06/07/2016 OT Individual Time: 1100-1158 OT Individual Time Calculation (min): 58 min   Session Note:  Pt worked on donning and doffing the left shoe using a shoe funnel and reacher.  Discussed use of elastic shoe strings as well to allow for greater expansion.  Pt reports he has a pair of slip on shoes with a back that he is going to wear at home instead of the lace ups.  Discussed need for walker and reacher bag, which therapist showed examples.  Did not issue these as pt has a relative that can make them for him easily.  Practiced simulated walk-in shower transfers as well as toilet transfers.  Supervision for both of these with use of the RW.  Pt still with limited flexibility in the left hip making sit to stand still slightly difficult depending on the height of the surface he is standing from.  Once standing pt demonstrates good balance with BUE on the walker.  Pt left in recliner with call button in reach and family present.   Patient has met 5 of 8 long term goals due to improved activity tolerance, improved balance and ability to compensate for deficits.  Patient to discharge at overall Supervision level.  Patient's care partner is independent to provide the necessary physical assistance at discharge.    Reasons goals not met: Pt continues to need supervision for toilet transfers and for dynamic standing balance.  Supervision for LB dressing as well as pt still exhibits difficulty with donning left shoe, but is successful if tying shoe loosely and using reacher and shoe funnel.    Recommendation:  Patient will benefit from ongoing skilled OT services in home health setting to continue to advance functional skills in the area of BADL.  Pt still needs supervision for functional transfers and demonstrates limited endurance at times.  Feel he needs continued OT to  progress to independent/modified independent level for most selfcare tasks.  His wife and daughter have been educated on safe assist with all selfcare tasks.    Equipment: 3:1  Reasons for discharge: treatment goals met and discharge from hospital  Patient/family agrees with progress made and goals achieved: Yes  OT Discharge Precautions/Restrictions  Precautions Precautions: Fall Restrictions Weight Bearing Restrictions: No LLE Weight Bearing: Weight bearing as tolerated  Pain Pain Assessment Pain Assessment: Faces Pain Score: 4  Faces Pain Scale: Hurts a little bit Pain Type: Acute pain Pain Location: Back Pain Orientation: Lower Pain Descriptors / Indicators: Aching Pain Onset: With Activity Pain Intervention(s): Ambulation/increased activity;Emotional support ADL  See Function Section of chart for details  Vision/Perception  Vision- History Baseline Vision/History: Wears glasses Wears Glasses: At all times Patient Visual Report: No change from baseline Vision- Assessment Vision Assessment?: No apparent visual deficits  Cognition Overall Cognitive Status: Within Functional Limits for tasks assessed Arousal/Alertness: Awake/alert Orientation Level: Oriented X4 Attention: Alternating Alternating Attention: Appears intact Memory: Appears intact Awareness: Appears intact Problem Solving: Appears intact Safety/Judgment: Appears intact Sensation Sensation Light Touch: Appears Intact Stereognosis: Appears Intact Hot/Cold: Appears Intact Proprioception: Appears Intact Additional Comments: Sensation intact in BUEs Coordination Gross Motor Movements are Fluid and Coordinated: Yes Fine Motor Movements are Fluid and Coordinated: Yes  Mobility  Transfers Transfers: Sit to Stand;Stand to Sit Sit to Stand: 5: Supervision;With armrests;From toilet;With upper extremity assist Stand to Sit: 5: Supervision;With armrests;With upper extremity assist;To toilet   Trunk/Postural Assessment  Cervical Assessment Cervical Assessment: Within Functional Limits Thoracic Assessment Thoracic Assessment: Within Functional Limits Lumbar Assessment Lumbar Assessment: Within Functional Limits  Balance Balance Balance Assessed: Yes Static Standing Balance Static Standing - Balance Support: Bilateral upper extremity supported Static Standing - Level of Assistance: 6: Modified independent (Device/Increase time) Dynamic Standing Balance Dynamic Standing - Balance Support: Bilateral upper extremity supported;During functional activity Dynamic Standing - Level of Assistance: 5: Stand by assistance Extremity/Trunk Assessment RUE Assessment RUE Assessment: Within Functional Limits LUE Assessment LUE Assessment: Within Functional Limits (Pt with new graft site and per pt report is not to ift mmore than 5lbs with the LUE at this time.  )   See Function Navigator for Current Functional Status.  Vandora Jaskulski OTR/L 06/07/2016, 12:42 PM

## 2016-06-07 NOTE — Progress Notes (Signed)
Physical Therapy Discharge Summary  Patient Details  Name: Thomas [name] MRN: [id number] Date of Birth: 21-Jul-1946  Today's Date: 06/07/2016 PT Individual Time: 1300-1400 PT Individual Time Calculation (min): 60 min    Patient has met 9 of 10 long term goals due to improved activity tolerance, improved balance, improved postural control, increased strength, increased range of motion, decreased pain, ability to compensate for deficits, functional use of  left lower extremity, improved attention and improved awareness.  Patient to discharge at an ambulatory level Supervision.   Patient's care partner is independent to provide the necessary physical assistance at discharge.  Reasons goals not met: Recommend S for chair <>bed transfer  Recommendation:  Patient will benefit from ongoing skilled PT services in outpatient setting to continue to advance safe functional mobility, address ongoing impairments in ROM, strength, balance, endurance, and minimize fall risk.  Equipment: RW  Reasons for discharge: treatment goals met and discharge from hospital  Patient/family agrees with progress made and goals achieved: Yes  PT Discharge Precautions/Restrictions Precautions Precautions: Fall Restrictions Weight Bearing Restrictions: No Pain Pain Assessment Pain Assessment: 0-10 Pain Score: 4  Faces Pain Scale: Hurts a little bit Pain Type: Acute pain Pain Location: Back Pain Orientation: Lower Pain Descriptors / Indicators: Aching Pain Onset: With Activity Patients Stated Pain Goal: 2 Pain Intervention(s): Ambulation/increased activity;Repositioned Multiple Pain Sites: No Vision/Perception    WFL Cognition Overall Cognitive Status: Within Functional Limits for tasks assessed Arousal/Alertness: Awake/alert Orientation Level: Oriented X4 Attention: Alternating Alternating Attention: Appears intact Memory: Appears intact Awareness: Appears intact Problem Solving: Appears  intact Safety/Judgment: Appears intact Sensation Sensation Light Touch: Appears Intact Stereognosis: Appears Intact Hot/Cold: Appears Intact Proprioception: Appears Intact Additional Comments: Sensation intact in BUEs Coordination Gross Motor Movements are Fluid and Coordinated: Yes Fine Motor Movements are Fluid and Coordinated: Yes Motor  Motor Motor - Discharge Observations: generalized weakness, LLE limited due to pain  Mobility Transfers Sit to Stand: 5: Supervision;With armrests;From toilet;With upper extremity assist Stand to Sit: 5: Supervision;With armrests;With upper extremity assist;To toilet Locomotion  Ambulation Ambulation: Yes Ambulation/Gait Assistance: 5: Supervision Ambulation Distance (Feet): 215 Feet Assistive device: Rolling walker Ambulation/Gait Assistance Details: Verbal cues for technique;Tactile cues for posture Gait Gait: Yes Gait Pattern: Impaired Gait Pattern: Antalgic;Decreased weight shift to left;Decreased stride length;Poor foot clearance - left;Step-through pattern Gait velocity: 1.5 ft/sec  Trunk/Postural Assessment  Cervical Assessment Cervical Assessment: Within Functional Limits Thoracic Assessment Thoracic Assessment: Within Functional Limits Lumbar Assessment Lumbar Assessment: Within Functional Limits  Balance Balance Balance Assessed: Yes Standardized Balance Assessment Standardized Balance Assessment: Timed Up and Go Test Timed Up and Go Test TUG: Normal TUG Static Sitting Balance Static Sitting - Balance Support: No upper extremity supported;Feet supported Static Sitting - Level of Assistance: 6: Modified independent (Device/Increase time) Dynamic Sitting Balance Dynamic Sitting - Balance Support: Feet supported;No upper extremity supported Dynamic Sitting - Level of Assistance: 6: Modified independent (Device/Increase time) Dynamic Sitting - Balance Activities: Lateral lean/weight shifting;Reaching across midline;Reaching  for objects Static Standing Balance Static Standing - Balance Support: Bilateral upper extremity supported Static Standing - Level of Assistance: 6: Modified independent (Device/Increase time) Dynamic Standing Balance Dynamic Standing - Balance Support: Bilateral upper extremity supported;During functional activity Dynamic Standing - Level of Assistance: 5: Stand by assistance Dynamic Standing - Balance Activities: Lateral lean/weight shifting;Forward lean/weight shifting;Reaching for objects;Reaching across midline Extremity Assessment  RUE Assessment RUE Assessment: Within Functional Limits LUE Assessment LUE Assessment: Within Functional Limits (Pt with new graft site and per pt report is not  to ift mmore than 5lbs with the LUE at this time.  ) RLE Assessment RLE Assessment: Exceptions to [facility] (hip flexion 4-/5, knee flexion 4+/5, knee extension 4/5, ankle dorsiflexion 4-/5, ankle plantarflexion 4+/5) LLE Assessment LLE Assessment: Exceptions to [facility] (hip flexion 2/5, knee extension 3/5, knee flexion 4-/5, ankle dorsiflexion 4-/5, ankle plantarflexion 4+/5)  Skilled Therapeutic Intervention: Pt received seated in recliner, denies pain and agreeable to treatment. Assessed all mobility as described above with S overall with RW. Recommend family provide S for transfers at this time due to impaired body mechanics with LLE flexion ROM limitations. Educated pt and family in continued performance of HEP with focus on maintaining LLE knee extension and increasing knee flexion. Pt and family with no further questions/concerns at this time. Noted increased swelling in L knee and ankle; RN alerted and applied ace wrap and ice to L knee with LLE elevated in recliner. Pt remained seated in recliner at completion of session, all needs in reach and family present.  See Function Navigator for Current Functional Status.  [name] 06/07/2016, 4:01 PM

## 2016-06-07 NOTE — Plan of Care (Signed)
Problem: RH Balance Goal: LTG Patient will maintain dynamic standing with ADLs (OT) LTG: Patient will maintain dynamic standing balance with assist during activities of daily living (OT)  Outcome: Not Met (add Reason) Needs supervision for safety.  Problem: RH Dressing Goal: LTG Patient will perform lower body dressing w/assist (OT) LTG: Patient will perform lower body dressing with assist, with/without cues in positioning using equipment (OT)  Outcome: Not Met (add Reason) Needs supervision for LB dressing.  Problem: RH Toilet Transfers Goal: LTG Patient will perform toilet transfers w/assist (OT) LTG: Patient will perform toilet transfers with assist, with/without cues using equipment (OT)  Outcome: Not Met (add Reason) Needs supervision for toilet transfer.

## 2016-06-07 NOTE — Progress Notes (Signed)
Physical Therapy Session Note  Patient Details  Name: Thomas [name] MRN: [id number] Date of Birth: 1946/05/17  Today's Date: 06/07/2016 PT Individual Time: [id number] PT Individual Time Calculation (min): 40 min   Short Term Goals: Week 1:  PT Short Term Goal 1 (Week 1): Pt will perform bed mobility with S using bed features PT Short Term Goal 2 (Week 1): Pt will perform sit <>stand with S PT Short Term Goal 3 (Week 1): Pt will perform bed <>w/c transfer with S PT Short Term Goal 4 (Week 1): Pt will ambulate 150' min guard with LRAD PT Short Term Goal 5 (Week 1): Pt will ascend/descend 2 6" stairs with min guard using RW  Skilled Therapeutic Interventions/Progress Updates:    Pt received in bed with wife & daughter present during session. Pt reported 0/10 pain at rest & 4/10 with movement/touch in LLE. Pt discussing course of illness leading to hospital admission with therapist providing therapeutic listening. Pt able to transfer supine>sit without bed features but with min A hand support from daughter to transfer supine>sit. Sitting EOB pt able to don new shirt independently, but required total A for ted hose & min A for pants. Pt able to complete sit<>Stand transfers with close supervision and gait training x 200 ft with RW & supervision. Pt progressing to more reciprocal gait pattern. At end of session pt returned to room & transferred into recliner with supervision and cushion in place. Educated pt on need to stand every 30 minutes to relieve pressure on buttocks, with pt & family voicing understanding. Pt left in recliner with all needs within reach & family present to supervise.  Therapy Documentation Precautions:  Precautions Precautions: Fall Restrictions Weight Bearing Restrictions: Yes LLE Weight Bearing: Weight bearing as tolerated  Pain: Pain Assessment Pain Assessment: 0-10 Pain Score: 4  Pain Location: Leg Pain Orientation: Left Pain Onset: With Activity   See Function  Navigator for Current Functional Status.   Therapy/Group: Individual Therapy  Waunita Schooner 06/07/2016, 7:47 AM

## 2016-06-07 NOTE — Progress Notes (Signed)
Occupational Therapy Session Note  Patient Details  Name: Thomas [name] MRN: [id number] Date of Birth: 1946/06/17  Today's Date: 06/07/2016 OT Individual Time: 1415-1455 OT Individual Time Calculation (min): 40 min    Short Term Goals: Week 1:  OT Short Term Goal 1 (Week 1): Pt will complete LB dressing with supervision and use of AE as needed OT Short Term Goal 2 (Week 1): Pt will complete 2 grooming tasks in standing at sink with supervision OT Short Term Goal 3 (Week 1): Pt will complete sit > stand froom various height surfaces to increase success with functional transfers OT Short Term Goal 4 (Week 1): Pt will complete toilet transfer to [facility] over toilet with supervision and LRAD  Skilled Therapeutic Interventions/Progress Updates:    1:1 focus on functional mobility including community mobility outside. Practicing maneuvering  over uneven terrain and thresholds. Pt limited in distance he could ambulate this om due to swelling in left knee and discomfort. Pt able to perform sit to stand from different height surfaces with supervision with extra time for positioning with left LE. Wife present for session. Also discussed energy conservation with community outings.   Therapy Documentation Precautions:  Precautions Precautions: Fall Restrictions Weight Bearing Restrictions: No LLE Weight Bearing: Weight bearing as tolerated Pain: C/o left knee tender and swollen limiting his distance with functional ambulation. Applied ice and elevated after therapy session See Function Navigator for Current Functional Status.   Therapy/Group: Individual Therapy  Willeen Cass [facility] 06/07/2016, 3:19 PM

## 2016-06-07 NOTE — Progress Notes (Signed)
  [facility] Progress Note    Assessment/ Plan:   Pt is a 70 y.o. yo male who was admitted on 05/09/2016 with new renal failure in the setting of newly diagnosed myeloma   1. ESRD w/new diagnosis of myeloma- HD requiring since 5/31- TTS schedule- CLIP done- spot at [facility] TTS 2nd shift. He needs to be there Friday to fill at paperwork. - s/p AVF placed on 6/14- has PC as well.  - Next HD 1st thing tomorrow AM to facilitate discharge. - Most likely will have to continue dialysis as an outpt. - I instructed him on signs to look for if there is recovery of renal function but I also reiterated that with the biopsy findings recovery is less likely.  2. Myeloma- new dx- started on velcade per onc on 6/8   Subjective:   Doing well in rehab, although week. Has an appetitie.  Denies f/c/n/v. He would like to drink more which i think is reasonable. No complaints and he is very excited to leave tomorrow.   Objective:   BP 139/65 mmHg  Pulse 85  Temp(Src) 98.8 F (37.1 C) (Oral)  Resp 16  Ht 6\' 1"  (1.854 m)  Wt 76.6 kg (168 lb 14 oz)  BMI 22.28 kg/m2  SpO2 100%  Intake/Output Summary (Last 24 hours) at 06/07/16 1214 Last data filed at 06/07/16 0952  Gross per 24 hour  Intake    240 ml  Output    300 ml  Net    -60 ml   Weight change:   Physical Exam: NAD, supine in bed CTA b/l, poor air movement. RRR, no S3 or S4 SNDNT No lower edema to tr Access: 1st stage BBF --> positive thrill, RIJ TC. Positive thrill in the left BBF.  Imaging: No results found.  Labs: BMET  Recent Labs Lab 06/01/16 0456 06/03/16 1513 06/06/16 0632 06/06/16 1600  NA 132* 133* 133* 130*  K 4.1 3.7 4.3 4.2  CL 97* 99* 98* 99*  CO2 27 27 27 24   GLUCOSE 80 138* 93 115*  BUN 23* 23* 23* 27*  CREATININE 5.02* 5.29* 5.47* 5.57*  CALCIUM 8.4* 8.1* 8.5* 8.0*  PHOS  --  2.9  --  2.4*   CBC  Recent Labs Lab 06/01/16 0456 06/03/16 1513 06/06/16 1600  WBC 4.1 4.1  3.6*  NEUTROABS 2.8  --   --   HGB 8.6* 7.7* 8.2*  HCT 27.2* 24.1* 25.7*  MCV 91.6 90.9 91.1  PLT 72* 106* 107*    Medications:    . acyclovir  200 mg Oral BID  . calcium acetate  667 mg Oral BID WC  . darbepoetin (ARANESP) injection - DIALYSIS  100 mcg Intravenous Q Sat-HD  . dexamethasone  40 mg Oral Weekly  . heparin  2,500 Units Dialysis Once in dialysis  . metoCLOPramide  5 mg Oral TID AC  . multivitamin  1 tablet Oral QHS  . pantoprazole  40 mg Oral Daily  . polyethylene glycol  17 g Oral Daily  . senna-docusate  2 tablet Oral QHS  . vitamin B-12  1,000 mcg Oral Daily  . Vitamin D (Ergocalciferol)  50,000 Units Oral Q7 days      Otelia Santee, MD 06/07/2016, 12:14 PM

## 2016-06-08 ENCOUNTER — Telehealth: Payer: Self-pay | Admitting: Hematology and Oncology

## 2016-06-08 ENCOUNTER — Telehealth: Payer: Self-pay | Admitting: *Deleted

## 2016-06-08 ENCOUNTER — Ambulatory Visit
Admission: RE | Admit: 2016-06-08 | Discharge: 2016-06-08 | Disposition: A | Discharge status: Home / Self Care | Payer: PPO | Source: Ambulatory Visit | Attending: Radiation Oncology | Admitting: Radiation Oncology

## 2016-06-08 ENCOUNTER — Other Ambulatory Visit: Payer: Self-pay | Admitting: *Deleted

## 2016-06-08 ENCOUNTER — Ambulatory Visit: Payer: PPO | Admitting: Radiation Oncology

## 2016-06-08 ENCOUNTER — Telehealth: Payer: Self-pay | Admitting: Radiation Oncology

## 2016-06-08 ENCOUNTER — Ambulatory Visit
Admit: 2016-06-08 | Discharge: 2016-06-08 | Disposition: A | Discharge status: Home / Self Care | Payer: PPO | Attending: Radiation Oncology | Admitting: Radiation Oncology

## 2016-06-08 DIAGNOSIS — K5900 Constipation, unspecified: Secondary | ICD-10-CM | POA: Insufficient documentation

## 2016-06-08 DIAGNOSIS — K5901 Slow transit constipation: Secondary | ICD-10-CM

## 2016-06-08 LAB — CBC
HEMATOCRIT: 25.5 % — AB (ref 39.0–52.0)
Hemoglobin: 8.3 g/dL — ABNORMAL LOW (ref 13.0–17.0)
MCH: 30.9 pg (ref 26.0–34.0)
MCHC: 32.5 g/dL (ref 30.0–36.0)
MCV: 94.8 fL (ref 78.0–100.0)
PLATELETS: 99 10*3/uL — AB (ref 150–400)
RBC: 2.69 MIL/uL — AB (ref 4.22–5.81)
RDW: 17.7 % — ABNORMAL HIGH (ref 11.5–15.5)
WBC: 3.6 10*3/uL — ABNORMAL LOW (ref 4.0–10.5)

## 2016-06-08 LAB — RENAL FUNCTION PANEL
ANION GAP: 7 (ref 5–15)
Albumin: 2.1 g/dL — ABNORMAL LOW (ref 3.5–5.0)
BUN: 14 mg/dL (ref 6–20)
CHLORIDE: 97 mmol/L — AB (ref 101–111)
CO2: 28 mmol/L (ref 22–32)
CREATININE: 4.05 mg/dL — AB (ref 0.61–1.24)
Calcium: 8.1 mg/dL — ABNORMAL LOW (ref 8.9–10.3)
GFR calc non Af Amer: 14 mL/min — ABNORMAL LOW (ref >60)
GFR, EST AFRICAN AMERICAN: 16 mL/min — AB (ref >60)
Glucose, Bld: 88 mg/dL (ref 65–99)
PHOSPHORUS: 2.6 mg/dL (ref 2.5–4.6)
POTASSIUM: 3.8 mmol/L (ref 3.5–5.1)
Sodium: 132 mmol/L — ABNORMAL LOW (ref 135–145)

## 2016-06-08 MED ORDER — CYANOCOBALAMIN 1000 MCG PO TABS: 1000.0000 ug | ORAL_TABLET | Freq: Every day | ORAL | Status: DC

## 2016-06-08 MED ORDER — VITAMIN D (ERGOCALCIFEROL) 1.25 MG (50000 UNIT) PO CAPS: 50000.0000 [IU] | ORAL_CAPSULE | ORAL | Status: DC

## 2016-06-08 MED ORDER — DEXAMETHASONE 4 MG PO TABS: 40.0000 mg | ORAL_TABLET | ORAL | Status: DC

## 2016-06-08 MED ORDER — METHOCARBAMOL 500 MG PO TABS: 250.0000 mg | ORAL_TABLET | Freq: Four times a day (QID) | ORAL | Status: DC | PRN

## 2016-06-08 MED ORDER — POLYETHYLENE GLYCOL 3350 17 G PO PACK: 17.0000 g | PACK | Freq: Every day | ORAL | Status: DC

## 2016-06-08 MED ORDER — ALPRAZOLAM 0.25 MG PO TABS: 0.2500 mg | ORAL_TABLET | Freq: Two times a day (BID) | ORAL | Status: DC | PRN

## 2016-06-08 MED ORDER — HEPARIN SODIUM (PORCINE) 1000 UNIT/ML DIALYSIS
2500.0000 [IU] | INTRAMUSCULAR | Status: DC | PRN
  Filled 2016-06-08: qty 3

## 2016-06-08 MED ORDER — PANTOPRAZOLE SODIUM 40 MG PO TBEC: 40.0000 mg | DELAYED_RELEASE_TABLET | Freq: Every day | ORAL | Status: DC

## 2016-06-08 MED ORDER — OXYCODONE-ACETAMINOPHEN 5-325 MG PO TABS: 1.0000 | ORAL_TABLET | Freq: Four times a day (QID) | ORAL | Status: DC | PRN

## 2016-06-08 MED ORDER — CALCIUM ACETATE (PHOS BINDER) 667 MG PO CAPS: 667.0000 mg | ORAL_CAPSULE | Freq: Two times a day (BID) | ORAL | Status: DC

## 2016-06-08 MED ORDER — METOCLOPRAMIDE HCL 5 MG PO TABS: 5.0000 mg | ORAL_TABLET | Freq: Three times a day (TID) | ORAL | Status: DC

## 2016-06-08 MED ORDER — RENA-VITE PO TABS: 1.0000 | ORAL_TABLET | Freq: Every day | ORAL | Status: DC

## 2016-06-08 MED ORDER — ACYCLOVIR 200 MG PO CAPS: 200.0000 mg | ORAL_CAPSULE | Freq: Two times a day (BID) | ORAL | Status: DC

## 2016-06-08 MED ORDER — ALPRAZOLAM 0.5 MG PO TABS
ORAL_TABLET | ORAL | Status: AC
  Filled 2016-06-08: qty 1

## 2016-06-08 NOTE — Telephone Encounter (Signed)
Per Shona Simpson' order phoned patient's wife and cancelled 2 pm simulation appointment for today. She understands that after they see Dr. Alvy Bimler on Monday we may reschedule the simulation appointment for her husband if Alvy Bimler deems appropriate. Informed Amy, RT in [facility] of this finding.

## 2016-06-08 NOTE — Telephone Encounter (Signed)
Per staff message and POF I have scheduled appts. Advised scheduler of appts. JMW  

## 2016-06-08 NOTE — Progress Notes (Signed)
  [facility] Progress Note    Assessment/ Plan:   Pt is a 70 y.o. yo male who was admitted on 05/09/2016 with new renal failure in the setting of newly diagnosed myeloma   1. ESRD w/new diagnosis of myeloma- HD requiring since 5/31- TTS schedule- CLIP done- spot at [facility] TTS 2nd shift. He needs to be there Friday to fill at paperwork. - s/p AVF placed on 6/14- has PC as well.  - Seen on HD @ 950am  - Doing well w/ stable Vitals and no complaints  - 4k bath with 1.5L UF  - Turn off UF given given UOP over past 24hrs.  - AP/VP -170/150 - Most likely will have to continue dialysis as an outpt. - I instructed him on signs to look for if there is recovery of renal function but I also reiterated that with the biopsy findings recovery is less likely.  2. Myeloma- new dx- started on velcade per onc on 6/8   Subjective:   Doing well in rehab, although week. Has an appetitie.  Denies f/c/n/v. He would like to drink more which i think is reasonable.  Seen on HD this AM.   Objective:   BP 139/71 mmHg  Pulse 78  Temp(Src) 98.6 F (37 C) (Oral)  Resp 16  Ht 6\' 1"  (1.854 m)  Wt 74.3 kg (163 lb 12.8 oz)  BMI 21.62 kg/m2  SpO2 99%  Intake/Output Summary (Last 24 hours) at 06/08/16 1020 Last data filed at 06/08/16 0629  Gross per 24 hour  Intake    480 ml  Output    700 ml  Net   -220 ml   Weight change: 1.2 kg (2 lb 10.3 oz)  Physical Exam: NAD, supine in bed CTA b/l, poor air movement. RRR, no S3 or S4 SNDNT No lower edema to tr Access: 1st stage BBF --> positive thrill, RIJ TC. Positive thrill in the left BBF.  Imaging: No results found.  Labs: BMET  Recent Labs Lab 06/03/16 1513 06/06/16 0632 06/06/16 1600 06/08/16 0700  NA 133* 133* 130* 132*  K 3.7 4.3 4.2 3.8  CL 99* 98* 99* 97*  CO2 27 27 24 28   GLUCOSE 138* 93 115* 88  BUN 23* 23* 27* 14  CREATININE 5.29* 5.47* 5.57* 4.05*  CALCIUM 8.1* 8.5* 8.0* 8.1*  PHOS 2.9  --  2.4*  2.6   CBC  Recent Labs Lab 06/03/16 1513 06/06/16 1600 06/08/16 0700  WBC 4.1 3.6* 3.6*  HGB 7.7* 8.2* 8.3*  HCT 24.1* 25.7* 25.5*  MCV 90.9 91.1 94.8  PLT 106* 107* 99*    Medications:    . acyclovir  200 mg Oral BID  . calcium acetate  667 mg Oral BID WC  . darbepoetin (ARANESP) injection - DIALYSIS  100 mcg Intravenous Q Sat-HD  . dexamethasone  40 mg Oral Weekly  . heparin  2,500 Units Dialysis Once in dialysis  . metoCLOPramide  5 mg Oral TID AC  . multivitamin  1 tablet Oral QHS  . pantoprazole  40 mg Oral Daily  . polyethylene glycol  17 g Oral Daily  . senna-docusate  2 tablet Oral QHS  . vitamin B-12  1,000 mcg Oral Daily  . Vitamin D (Ergocalciferol)  50,000 Units Oral Q7 days      Otelia Santee, MD 06/08/2016, 10:20 AM

## 2016-06-08 NOTE — Telephone Encounter (Signed)
Call from pt's daughter who states pt is discharged from hospital.  He did not get his Dexamethasone in hospital today and she is not sure if he is supposed to take his weekly dose although this is his "week off" chemo.  Informed daughter that per Dr. Alvy Bimler pt was supposed to get Dex today.  Dau says pt has Rx they will get filled today.  Instructed her to have pt take it in the morning.  If he takes it this late in the day it could cause insomnia.  She verbalized understanding.

## 2016-06-08 NOTE — Progress Notes (Signed)
Subjective/Complaints:  Patient seen in HD.  He states he is excited to go home.  Review of systems denies any chest pain no shortness of breath, no abd pain, N/V/D.  Objective: Vital Signs: Blood pressure 133/66, pulse 71, temperature 98.6 F (37 C), temperature source Oral, resp. rate 16, height 6' 1"  (1.854 m), weight 74.3 kg (163 lb 12.8 oz), SpO2 99 %. No results found. Results for orders placed or performed during the hospital encounter of 05/29/16 (from the past 72 hour(s))  Basic metabolic panel     Status: Abnormal   Collection Time: 06/06/16  6:32 AM  Result Value Ref Range   Sodium 133 (L) 135 - 145 mmol/L   Potassium 4.3 3.5 - 5.1 mmol/L   Chloride 98 (L) 101 - 111 mmol/L   CO2 27 22 - 32 mmol/L   Glucose, Bld 93 65 - 99 mg/dL   BUN 23 (H) 6 - 20 mg/dL   Creatinine, Ser 5.47 (H) 0.61 - 1.24 mg/dL   Calcium 8.5 (L) 8.9 - 10.3 mg/dL   GFR calc non Af Amer 10 (L) >60 mL/min   GFR calc Af Amer 11 (L) >60 mL/min    Comment: (NOTE) The eGFR has been calculated using the CKD EPI equation. This calculation has not been validated in all clinical situations. eGFR's persistently <60 mL/min signify possible Chronic Kidney Disease.    Anion gap 8 5 - 15  Renal function panel     Status: Abnormal   Collection Time: 06/06/16  4:00 PM  Result Value Ref Range   Sodium 130 (L) 135 - 145 mmol/L   Potassium 4.2 3.5 - 5.1 mmol/L   Chloride 99 (L) 101 - 111 mmol/L   CO2 24 22 - 32 mmol/L   Glucose, Bld 115 (H) 65 - 99 mg/dL   BUN 27 (H) 6 - 20 mg/dL   Creatinine, Ser 5.57 (H) 0.61 - 1.24 mg/dL   Calcium 8.0 (L) 8.9 - 10.3 mg/dL   Phosphorus 2.4 (L) 2.5 - 4.6 mg/dL   Albumin 2.3 (L) 3.5 - 5.0 g/dL   GFR calc non Af Amer 9 (L) >60 mL/min   GFR calc Af Amer 11 (L) >60 mL/min    Comment: (NOTE) The eGFR has been calculated using the CKD EPI equation. This calculation has not been validated in all clinical situations. eGFR's persistently <60 mL/min signify possible Chronic  Kidney Disease.    Anion gap 7 5 - 15  CBC     Status: Abnormal   Collection Time: 06/06/16  4:00 PM  Result Value Ref Range   WBC 3.6 (L) 4.0 - 10.5 K/uL   RBC 2.82 (L) 4.22 - 5.81 MIL/uL   Hemoglobin 8.2 (L) 13.0 - 17.0 g/dL   HCT 25.7 (L) 39.0 - 52.0 %   MCV 91.1 78.0 - 100.0 fL   MCH 29.1 26.0 - 34.0 pg   MCHC 31.9 30.0 - 36.0 g/dL   RDW 16.8 (H) 11.5 - 15.5 %   Platelets 107 (L) 150 - 400 K/uL    Comment: CONSISTENT WITH PREVIOUS RESULT  CBC     Status: Abnormal   Collection Time: 06/08/16  7:00 AM  Result Value Ref Range   WBC 3.6 (L) 4.0 - 10.5 K/uL   RBC 2.69 (L) 4.22 - 5.81 MIL/uL   Hemoglobin 8.3 (L) 13.0 - 17.0 g/dL   HCT 25.5 (L) 39.0 - 52.0 %   MCV 94.8 78.0 - 100.0 fL   MCH 30.9 26.0 -  34.0 pg   MCHC 32.5 30.0 - 36.0 g/dL   RDW 17.7 (H) 11.5 - 15.5 %   Platelets 99 (L) 150 - 400 K/uL    Comment: CONSISTENT WITH PREVIOUS RESULT  Renal function panel     Status: Abnormal   Collection Time: 06/08/16  7:00 AM  Result Value Ref Range   Sodium 132 (L) 135 - 145 mmol/L   Potassium 3.8 3.5 - 5.1 mmol/L   Chloride 97 (L) 101 - 111 mmol/L   CO2 28 22 - 32 mmol/L   Glucose, Bld 88 65 - 99 mg/dL   BUN 14 6 - 20 mg/dL   Creatinine, Ser 4.05 (H) 0.61 - 1.24 mg/dL   Calcium 8.1 (L) 8.9 - 10.3 mg/dL   Phosphorus 2.6 2.5 - 4.6 mg/dL   Albumin 2.1 (L) 3.5 - 5.0 g/dL   GFR calc non Af Amer 14 (L) >60 mL/min   GFR calc Af Amer 16 (L) >60 mL/min    Comment: (NOTE) The eGFR has been calculated using the CKD EPI equation. This calculation has not been validated in all clinical situations. eGFR's persistently <60 mL/min signify possible Chronic Kidney Disease.    Anion gap 7 5 - 15     HEENT: Normocephalic, atraumatic. General: No acute distress. Vital signs reviewed. Psych: Mood and affect are appropriate Heart: Regular rate and rhythm no rubs murmurs or extra sounds Lungs: Clear to auscultation, breathing unlabored, no rales or wheezes Abdomen: Positive bowel  sounds, soft nontender to palpation, nondistended Skin: No evidence of breakdown, no evidence of rash Neurologic:  Motor strength is 5/5 in bilateral deltoid, , tricep, grip, 4/5 left biceps 5 right bicep, 5/5 righthip flexor, knee extensors, ankle dorsiflexor and plantar flexor, 2+/5 left hip flexor, knee extensor, 4/5 ankle dorsiflexor/plantar flexor Sensory exam normal sensation to light touch and proprioception in bilateral upper and lower extremities Cerebellar exam normal finger to nose to finger as well as heel to shin in bilateral upper and lower extremities Musculoskeletal: No edema. No tenderness.   Assessment/Plan: 1. Functional deficits secondary to left femur pathological fracture status post IM nail Stable for D/C today F/u PCP in 3-4 weeks F/u oncology Follow-up nephrology See D/C summary See D/C instructions Function - Bathing Position: Wheelchair/chair at sink Body parts bathed by patient: Right arm, Left arm, Chest, Abdomen, Front perineal area, Buttocks, Right upper leg, Left upper leg, Right lower leg, Back (Per pt/family report) Body parts bathed by helper: Left lower leg Bathing not applicable: Front perineal area, Buttocks, Right upper leg, Left upper leg, Right lower leg, Left lower leg (Did not attempt secondary to wife assisting with this last night and pt did not feel it needed to be done again) Assist Level: Supervision or verbal cues  Function- Upper Body Dressing/Undressing What is the patient wearing?: Button up shirt Button up shirt - Perfomed by patient: Thread/unthread right sleeve, Thread/unthread left sleeve, Button/unbutton shirt, Pull shirt around back Button up shirt - Perfomed by helper: Thread/unthread right sleeve Assist Level: More than reasonable time Set up : To obtain clothing/put away Function - Lower Body Dressing/Undressing What is the patient wearing?: Pants, [employer], Shoes, Underwear (Dressing per pt/family report) Position: Sitting  EOB Underwear - Performed by patient: Thread/unthread right underwear leg, Pull underwear up/down, Thread/unthread left underwear leg Underwear - Performed by helper: Thread/unthread left underwear leg Pants- Performed by patient: Thread/unthread right pants leg, Thread/unthread left pants leg, Pull pants up/down Pants- Performed by helper: Pull pants up/down Shoes -  Performed by patient: Fasten left, Fasten right, Don/doff left shoe, Don/doff right shoe Shoes - Performed by helper: Fasten right, Fasten left TED Hose - Performed by helper: Don/doff right TED hose, Don/doff left TED hose Assist for footwear: Partial/moderate assist Assist for lower body dressing: Supervision or verbal cues  Function - Toileting Toileting steps completed by patient: Adjust clothing prior to toileting, Performs perineal hygiene Toileting steps completed by helper: Adjust clothing after toileting Toileting Assistive Devices: Grab bar or rail Assist level: More than reasonable time  Function - Air cabin crew transfer assistive device: Grab bar, Walker Assist level to toilet: Supervision or verbal cues Assist level from toilet: Supervision or verbal cues  Function - Chair/bed transfer Chair/bed transfer method: Ambulatory Chair/bed transfer assist level: Supervision or verbal cues Chair/bed transfer assistive device: Walker Chair/bed transfer details: Verbal cues for precautions/safety  Function - Locomotion: Wheelchair Will patient use wheelchair at discharge?: No Max wheelchair distance: 150 Assist Level: Supervision or verbal cues Assist Level: Supervision or verbal cues Assist Level: Supervision or verbal cues Turns around,maneuvers to table,bed, and toilet,negotiates 3% grade,maneuvers on rugs and over doorsills: No Function - Locomotion: Ambulation Assistive device: Walker-rolling Max distance: 225 Assist level: Supervision or verbal cues Assist level: Supervision or verbal  cues Assist level: Supervision or verbal cues Walk 150 feet activity did not occur: Safety/medical concerns Assist level: Supervision or verbal cues Walk 10 feet on uneven surfaces activity did not occur: Safety/medical concerns Assist level: Supervision or verbal cues  Function - Comprehension Comprehension: Auditory Comprehension assist level: Follows complex conversation/direction with no assist  Function - Expression Expression: Verbal Expression assist level: Expresses complex ideas: With no assist  Function - Social Interaction Social Interaction assist level: Interacts appropriately with others - No medications needed.  Function - Problem Solving Problem solving assist level: Solves complex problems: Recognizes & self-corrects  Function - Memory Memory assist level: Complete Independence: No helper Patient normally able to recall (first 3 days only): Current season  Medical Problem List and Plan: 1.  Debilitation with decreased mobility secondary to Multiple myeloma/left femur pathologic fracture, DC today.  2.  DVT Prophylaxis/Anticoagulation:  Discontinued SCD 3. Pain Management: (Chronic and post-op) Hydrocodone as needed, discussed palliative radiation with radiation oncology physician assistant, this would not be occurring until about 2 weeks from now if needed. 4. Mood: Xanax 0.25 mg twice daily as needed 5. Neuropsych: This patient is capable of making decisions on his on behalf. 6. Skin/Wound Care: Monitor sacral decub--foam dressing for now. Routine pressure relief measures. Nephro between  Meals.    7. Fluids/Electrolytes/Nutrition:  Monitor I/O. Renal diet with 1200 ccFR., discussed that fruits and vegetables can be brought in from home 8. Multiple myeloma: Started on Velcade 06/08--follow-up per her oncology services. Weekly pulse dose dexamethasone. Follow-up oncology 9. ESRD: Strict I/O. 1200 cc/FR.  As per nephro, dialysis set up post discharge 10. Volume  overload/Hypotension:  Monitor for now.   11. Thrombocytopenia: stable, may be related to underlying illnesses however heparin may be contributing 12. Vitamin B12 deficiency: on supplement 13. Left femur pending pathologic fracture s/p IM nailing; WBAT.  14. constipated. Will Resume miralax and change colace and senna to Daily ,patient overall doing better in this regard 15. Acute on chronic anemia: Monitor. Transfuse if necessary 16. HTN: Monitor and provide prns in accordance with increased physical exertion and pain 17. Nausea and vomiting.multifactorial, appears to have resolved.  LOS (Days) [place] PERFORMED  Isha Seefeld Lorie Phenix  06/08/2016, 9:02 AM

## 2016-06-08 NOTE — Progress Notes (Signed)
Social Work  Discharge Note  The overall goal for the admission was met for:   Discharge location: [place] TO ASSIST  Length of Stay: Yes-10 DAYS  Discharge activity level: Yes-SUPERVISION LEVEL  Home/community participation: Yes  Services provided included: MD, RD, PT, OT, RN, CM, TR, Pharmacy and SW  Financial Services: Private Insurance: [place]  Follow-up services arranged: Home Health: [place], DME: [place] and Patient/Family has no preference for HH/DME agencies  Comments (or additional information):WIFE AND DAUGHTER [place]. WORKING ON GETTING HD CHANGED TO [facility] DUE TO CLOSER TO HOME. JUDY IS WORKING ON THIS WITH FAMILY.  Patient/Family verbalized understanding of follow-up arrangements: Yes  Individual responsible for coordination of the follow-up plan: HILDA-WIFE  Confirmed correct DME delivered: Elease Hashimoto 06/08/2016    Elease Hashimoto

## 2016-06-08 NOTE — Telephone Encounter (Signed)
cld & spoke to wife and adv to be @ appt @ 9:30-she understood

## 2016-06-12 ENCOUNTER — Ambulatory Visit (HOSPITAL_BASED_OUTPATIENT_CLINIC_OR_DEPARTMENT_OTHER): Payer: PPO

## 2016-06-12 ENCOUNTER — Other Ambulatory Visit (HOSPITAL_BASED_OUTPATIENT_CLINIC_OR_DEPARTMENT_OTHER): Payer: PPO

## 2016-06-12 ENCOUNTER — Telehealth: Payer: Self-pay | Admitting: Hematology and Oncology

## 2016-06-12 ENCOUNTER — Encounter: Payer: Self-pay | Admitting: Pharmacist

## 2016-06-12 ENCOUNTER — Encounter: Payer: Self-pay | Admitting: Hematology and Oncology

## 2016-06-12 ENCOUNTER — Ambulatory Visit (HOSPITAL_BASED_OUTPATIENT_CLINIC_OR_DEPARTMENT_OTHER): Payer: PPO | Admitting: Hematology and Oncology

## 2016-06-12 VITALS — BP 140/62 | HR 95 | Temp 98.4°F | Resp 19 | Wt 162.0 lb

## 2016-06-12 DIAGNOSIS — E440 Moderate protein-calorie malnutrition: Secondary | ICD-10-CM | POA: Diagnosis not present

## 2016-06-12 DIAGNOSIS — E538 Deficiency of other specified B group vitamins: Secondary | ICD-10-CM

## 2016-06-12 DIAGNOSIS — T451X5A Adverse effect of antineoplastic and immunosuppressive drugs, initial encounter: Secondary | ICD-10-CM

## 2016-06-12 DIAGNOSIS — Z5112 Encounter for antineoplastic immunotherapy: Secondary | ICD-10-CM | POA: Diagnosis not present

## 2016-06-12 DIAGNOSIS — N186 End stage renal disease: Secondary | ICD-10-CM | POA: Diagnosis not present

## 2016-06-12 DIAGNOSIS — C9000 Multiple myeloma not having achieved remission: Secondary | ICD-10-CM

## 2016-06-12 DIAGNOSIS — S72302S Unspecified fracture of shaft of left femur, sequela: Secondary | ICD-10-CM

## 2016-06-12 DIAGNOSIS — D61810 Antineoplastic chemotherapy induced pancytopenia: Secondary | ICD-10-CM

## 2016-06-12 DIAGNOSIS — Z992 Dependence on renal dialysis: Secondary | ICD-10-CM

## 2016-06-12 LAB — COMPREHENSIVE METABOLIC PANEL
ALBUMIN: 2.4 g/dL — AB (ref 3.5–5.0)
ALK PHOS: 114 U/L (ref 40–150)
ALT: 12 U/L (ref 0–55)
AST: 15 U/L (ref 5–34)
Anion Gap: 8 mEq/L (ref 3–11)
BUN: 30.1 mg/dL — AB (ref 7.0–26.0)
CALCIUM: 8.1 mg/dL — AB (ref 8.4–10.4)
CO2: 25 mEq/L (ref 22–29)
CREATININE: 4.3 mg/dL — AB (ref 0.7–1.3)
Chloride: 96 mEq/L — ABNORMAL LOW (ref 98–109)
EGFR: 13 mL/min/{1.73_m2} — ABNORMAL LOW (ref >90)
Glucose: 124 mg/dl (ref 70–140)
POTASSIUM: 4.8 meq/L (ref 3.5–5.1)
Sodium: 129 mEq/L — ABNORMAL LOW (ref 136–145)
Total Bilirubin: 0.71 mg/dL (ref 0.20–1.20)
Total Protein: 7.7 g/dL (ref 6.4–8.3)

## 2016-06-12 LAB — CBC WITH DIFFERENTIAL/PLATELET
BASO%: 0.3 % (ref 0.0–2.0)
BASOS ABS: 0 10*3/uL (ref 0.0–0.1)
EOS%: 0.3 % (ref 0.0–7.0)
Eosinophils Absolute: 0 10*3/uL (ref 0.0–0.5)
HEMATOCRIT: 26.5 % — AB (ref 38.4–49.9)
HEMOGLOBIN: 8.7 g/dL — AB (ref 13.0–17.1)
LYMPH#: 0.9 10*3/uL (ref 0.9–3.3)
LYMPH%: 23.9 % (ref 14.0–49.0)
MCH: 30.5 pg (ref 27.2–33.4)
MCHC: 32.8 g/dL (ref 32.0–36.0)
MCV: 93 fL (ref 79.3–98.0)
MONO#: 0.6 10*3/uL (ref 0.1–0.9)
MONO%: 16.4 % — ABNORMAL HIGH (ref 0.0–14.0)
NEUT#: 2.2 10*3/uL (ref 1.5–6.5)
NEUT%: 59.1 % (ref 39.0–75.0)
PLATELETS: 101 10*3/uL — AB (ref 140–400)
RBC: 2.85 10*6/uL — ABNORMAL LOW (ref 4.20–5.82)
RDW: 16.8 % — AB (ref 11.0–14.6)
WBC: 3.8 10*3/uL — ABNORMAL LOW (ref 4.0–10.3)

## 2016-06-12 MED ORDER — PROCHLORPERAZINE MALEATE 10 MG PO TABS
ORAL_TABLET | ORAL | Status: AC
  Filled 2016-06-12: qty 1

## 2016-06-12 MED ORDER — PROCHLORPERAZINE MALEATE 10 MG PO TABS
10.0000 mg | ORAL_TABLET | Freq: Once | ORAL | Status: AC
  Administered 2016-06-12: 10 mg via ORAL

## 2016-06-12 MED ORDER — LENALIDOMIDE 2.5 MG PO CAPS: ORAL_CAPSULE | ORAL | Status: DC

## 2016-06-12 MED ORDER — ACYCLOVIR 200 MG PO CAPS: 200.0000 mg | ORAL_CAPSULE | Freq: Two times a day (BID) | ORAL | Status: DC

## 2016-06-12 MED ORDER — BORTEZOMIB CHEMO SQ INJECTION 3.5 MG (2.5MG/ML)
1.3000 mg/m2 | Freq: Once | INTRAMUSCULAR | Status: AC
  Administered 2016-06-12: 2.5 mg via SUBCUTANEOUS
  Filled 2016-06-12: qty 2.5

## 2016-06-12 NOTE — Patient Instructions (Signed)
Lenalidomide Oral Capsules  What is this medicine?  LENALIDOMIDE (len a LID oh mide) is a chemotherapy drug that targets specific proteins within cancer cells and stops the cancer cell from growing. It is used to treat multiple myeloma, mantle cell lymphoma, and some myelodysplastic syndromes that cause severe anemia requiring blood transfusions.  This medicine may be used for other purposes; ask your health care provider or pharmacist if you have questions.  What should I tell my health care provider before I take this medicine?  They need to know if you have any of these conditions:  -blood clots in the legs or the lungs  -high blood pressure  -high cholesterol  -infection  -irregular monthly periods or menstrual cycles  -kidney disease  -liver disease  -smoke tobacco  -thyroid disease  -an unusual or allergic reaction to lenalidomide, other medicines, foods, dyes, or preservatives  -pregnant or trying to get pregnant  -breast-feeding  How should I use this medicine?  Take this medicine by mouth with a glass of water. Follow the directions on the prescription label. Do not cut, crush, or chew this medicine. Take your medicine at regular intervals. Do not take it more often than directed. Do not stop taking except on your doctor's advice.  A MedGuide will be given with each prescription and refill. Read this guide carefully each time. The MedGuide may change frequently.  Talk to your pediatrician regarding the use of this medicine in children. Special care may be needed.  Overdosage: If you think you have taken too much of this medicine contact a poison control center or emergency room at once.  NOTE: This medicine is only for you. Do not share this medicine with others.  What if I miss a dose?  If you miss a dose, take it as soon as you can. If your next dose is to be taken in less than 12 hours, then do not take the missed dose. Take the next dose at your regular time. Do not take double or extra doses.  What may  interact with this medicine?  This medicine may interact with the following medications:  -digoxin  -medicines that increase the risk of thrombosis like estrogens or erythropoietic agents (e.g., epoetin alfa and darbepoetin alfa)  -warfarin  This list may not describe all possible interactions. Give your health care provider a list of all the medicines, herbs, non-prescription drugs, or dietary supplements you use. Also tell them if you smoke, drink alcohol, or use illegal drugs. Some items may interact with your medicine.  What should I watch for while using this medicine?  Visit your doctor for regular check ups. Tell your doctor or healthcare professional if your symptoms do not start to get better or if they get worse. You will need to have important blood work done while you are taking this medicine.  This medicine is available only through a special program. Doctors, pharmacies, and patients must meet all of the conditions of the program. Your health care provider will help you get signed up with the program if you need this medicine. Through the program you will only receive up to a 28 day supply of the medicine at one time. You will need a new prescription for each refill.  This medicine can cause birth defects. Do not get pregnant while taking this drug. Females with child-bearing potential will need to have 2 negative pregnancy tests before starting this medicine. Pregnancy testing must be done every 2 to 4 weeks as   directed while taking this medicine. Use 2 reliable forms of birth control together while you are taking this medicine and for 1 month after you stop taking this medicine. If you think that you might be pregnant talk to your doctor right away.  Men must use a latex condom during sexual contact with a woman while taking this medicine and for 28 days after you stop taking this medicine. A latex condom is needed even if you have had a vasectomy. Contact your doctor right away if your partner  becomes pregnant. Do not donate sperm while taking this medicine and for 28 days after you stop taking this medicine.  Do not give blood while taking the medicine and for 1 month after completion of treatment to avoid exposing pregnant women to the medicine through the donated blood.  Talk to your doctor about your risk of cancer. You may be more at risk for certain types of cancers if you take this medicine.  What side effects may I notice from receiving this medicine?  Side effects that you should report to your doctor or health care professional as soon as possible:  -allergic reactions like skin rash, itching or hives, swelling of the face, lips, or tongue  -breathing problems  -chest pain or tightness  -fast, irregular heartbeat  -low blood counts - this medicine may decrease the number of white blood cells, red blood cells and platelets. You may be at increased risk for infections and bleeding.  -seizures  -signs and symptoms of bleeding such as bloody or black, tarry stools; red or dark-brown urine; spitting up blood or brown material that looks like coffee grounds; red spots on the skin; unusual bruising or bleeding from the eye, gums, or nose  -signs and symptoms of a blood clot such as breathing problems; changes in vision; chest pain; severe, sudden headache; pain, swelling, warmth in the leg; trouble speaking; sudden numbness or weakness of the face, arm or leg  -signs and symptoms of liver injury like dark yellow or brown urine; general ill feeling or flu-like symptoms; light-colored stools; loss of appetite; nausea; right upper belly pain; unusually weak or tired; yellowing of the eyes or skin  -signs and symptoms of a stroke like changes in vision; confusion; trouble speaking or understanding; severe headaches; sudden numbness or weakness of the face, arm or leg; trouble walking; dizziness; loss of balance or coordination  -sweating  -vomiting  Side effects that usually do not require medical  attention (report to your doctor or health care professional if they continue or are bothersome):  -constipation  -cough  -diarrhea  -tiredness  This list may not describe all possible side effects. Call your doctor for medical advice about side effects. You may report side effects to FDA at 1-800-FDA-1088.  Where should I keep my medicine?  Keep out of the reach of children.  Store at room temperature between 15 and 30 degrees C (59 and 86 degrees F). Throw away any unused medicine after the expiration date.  NOTE: This sheet is a summary. It may not cover all possible information. If you have questions about this medicine, talk to your doctor, pharmacist, or health care provider.      2016, Elsevier/Gold Standard. (2014-03-03 18:30:01)

## 2016-06-12 NOTE — Progress Notes (Signed)
[facility] OFFICE PROGRESS NOTE  Patient Care Team: No Pcp Per Patient as PCP - General (General Practice)  SUMMARY OF ONCOLOGIC HISTORY:   Multiple myeloma not having achieved remission ([place])   05/09/2016 - 06/07/2016 Hospital Admission The patient was hospitalized for pancytopenia, acute renal failure and back pain. He was diagnosed with multiple myeloma requiring multiple surgery and hemodialysis. He was started on chemotherapy while hospitalized   05/11/2016 Pathology Results Accession: [id number] kidney biopsy confimed myeloma involvement   05/12/2016 Imaging Skeletal survey showed multiple bone lesions are suggestive for metastatic bone disease and suspicious for multiple myeloma. There is imminent pathological fracture of the left femur   05/14/2016 Pathology Results Accession: [id number] biopsy from left femur confirmed myeloma   05/16/2016 Imaging MRI spine: large 6.0 x 9.0 x 7.5 cm lesion with associated extraosseous extension of tumor into the left paraspinous soft tissues at L5 with moderate to severe canal stenosis. There is L3 compression fracture   05/16/2016 Bone Marrow Biopsy Accession: [id number] bone marrow biopsy confirmed 60% plasma cell involvement   05/18/2016 -  Chemotherapy He was started on Velcade and dexamethasone    INTERVAL HISTORY: Please see below for problem oriented charting. He is seen today for cycle 2 of treatment. Since discharge from the hospital, he denies pain. His appetite is good. He is making a lot of urine. He denies constipation or nausea. He is able to walk around well. The patient denies any recent signs or symptoms of bleeding such as spontaneous epistaxis, hematuria or hematochezia. He complained of fatigue after hemodialysis Denies recent infection such as cough   REVIEW OF SYSTEMS:   Constitutional: Denies fevers, chills or abnormal weight loss Eyes: Denies blurriness of vision Ears, nose, mouth, throat, and face: Denies mucositis  or sore throat Respiratory: Denies cough, dyspnea or wheezes Cardiovascular: Denies palpitation, chest discomfort or lower extremity swelling Gastrointestinal:  Denies nausea, heartburn or change in bowel habits Skin: Denies abnormal skin rashes Lymphatics: Denies new lymphadenopathy or easy bruising Neurological:Denies numbness, tingling or new weaknesses Behavioral/Psych: Mood is stable, no new changes  All other systems were reviewed with the patient and are negative.  I have reviewed the past medical history, past surgical history, social history and family history with the patient and they are unchanged from previous note.  ALLERGIES:  is allergic to penicillins; dilaudid; and codeine.  MEDICATIONS:  Current Outpatient Prescriptions  Medication Sig Dispense Refill  . acyclovir (ZOVIRAX) 200 MG capsule Take 1 capsule (200 mg total) by mouth 2 (two) times daily. 60 capsule 9  . ALPRAZolam (XANAX) 0.25 MG tablet Take 1 tablet (0.25 mg total) by mouth 2 (two) times daily as needed for anxiety. 30 tablet 0  . calcium acetate (PHOSLO) 667 MG capsule Take 1 capsule (667 mg total) by mouth 2 (two) times daily with a meal. 60 capsule 1  . cyanocobalamin 1000 MCG tablet Take 1 tablet (1,000 mcg total) by mouth daily. 30 tablet 0  . dexamethasone (DECADRON) 4 MG tablet Take 10 tablets (40 mg total) by mouth once a week. 60 tablet 0  . loratadine (CLARITIN) 10 MG tablet Take 10 mg by mouth daily.    . metoCLOPramide (REGLAN) 5 MG tablet Take 1 tablet (5 mg total) by mouth 3 (three) times daily before meals. 90 tablet 0  . multivitamin (RENA-VIT) TABS tablet Take 1 tablet by mouth at bedtime. 30 tablet 0  . pantoprazole (PROTONIX) 40 MG tablet Take 1 tablet (40 mg total) by mouth  daily. 30 tablet 1  . Vitamin D, Ergocalciferol, (DRISDOL) 50000 units CAPS capsule Take 1 capsule (50,000 Units total) by mouth every 7 (seven) days. 60 capsule 0  . acetaminophen (TYLENOL) 325 MG tablet Take 2 tablets  (650 mg total) by mouth every 6 (six) hours as needed for mild pain (or Fever >/= 101). (Patient not taking: Reported on 06/12/2016) 15 tablet 0  . hydroxypropyl methylcellulose / hypromellose (ISOPTO TEARS / GONIOVISC) 2.5 % ophthalmic solution Place 2 drops into both eyes 4 (four) times daily as needed for dry eyes. (Patient not taking: Reported on 06/12/2016) 15 mL 12  . lenalidomide (REVLIMID) 2.5 MG capsule Take 1 capsule daily for 14 days, then off 7 days 14 capsule 11  . methocarbamol (ROBAXIN) 500 MG tablet Take 0.5 tablets (250 mg total) by mouth every 6 (six) hours as needed for muscle spasms. (Patient not taking: Reported on 06/12/2016) 30 tablet 0  . oxyCODONE-acetaminophen (PERCOCET/ROXICET) 5-325 MG tablet Take 1-2 tablets by mouth every 6 (six) hours as needed for moderate pain. (Patient not taking: Reported on 06/12/2016) 60 tablet 0  . polyethylene glycol (MIRALAX / GLYCOLAX) packet Take 17 g by mouth daily. (Patient not taking: Reported on 06/12/2016) 14 each 0   No current facility-administered medications for this visit.    PHYSICAL EXAMINATION: ECOG PERFORMANCE STATUS: 1 - Symptomatic but completely ambulatory  Filed Vitals:   06/12/16 0953  BP: 140/62  Pulse: 95  Temp: 98.4 F (36.9 C)  Resp: 19   Filed Weights   06/12/16 0953  Weight: 162 lb (73.483 kg)    GENERAL:alert, no distress and comfortable SKIN: skin color, texture, turgor are normal, no rashes or significant lesions EYES: normal, Conjunctiva are pink and non-injected, sclera clear OROPHARYNX:no exudate, no erythema and lips, buccal mucosa, and tongue normal  NECK: supple, thyroid normal size, non-tender, without nodularity LYMPH:  no palpable lymphadenopathy in the cervical, axillary or inguinal LUNGS: clear to auscultation and percussion with normal breathing effort HEART: regular rate & rhythm and no murmurs and no lower extremity edema ABDOMEN:abdomen soft, non-tender and normal bowel  sounds Musculoskeletal:no cyanosis of digits and no clubbing  NEURO: alert & oriented x 3 with fluent speech, no focal motor/sensory deficits  LABORATORY DATA:  I have reviewed the data as listed    Component Value Date/Time   NA 129* 06/12/2016 0932   NA 132* 06/08/2016 0700   K 4.8 06/12/2016 0932   K 3.8 06/08/2016 0700   CL 97* 06/08/2016 0700   CO2 25 06/12/2016 0932   CO2 28 06/08/2016 0700   GLUCOSE 124 06/12/2016 0932   GLUCOSE 88 06/08/2016 0700   BUN 30.1* 06/12/2016 0932   BUN 14 06/08/2016 0700   CREATININE 4.3* 06/12/2016 0932   CREATININE 4.05* 06/08/2016 0700   CALCIUM 8.1* 06/12/2016 0932   CALCIUM 8.1* 06/08/2016 0700   PROT 7.7 06/12/2016 0932   PROT 7.2 06/01/2016 0456   ALBUMIN 2.4* 06/12/2016 0932   ALBUMIN 2.1* 06/08/2016 0700   AST 15 06/12/2016 0932   AST 20 06/01/2016 0456   ALT 12 06/12/2016 0932   ALT 20 06/01/2016 0456   ALKPHOS 114 06/12/2016 0932   ALKPHOS 104 06/01/2016 0456   BILITOT 0.71 06/12/2016 0932   BILITOT 1.6* 06/01/2016 0456   GFRNONAA 14* 06/08/2016 0700   GFRAA 16* 06/08/2016 0700    No results found for: SPEP, UPEP  Lab Results  Component Value Date   WBC 3.8* 06/12/2016   NEUTROABS 2.2 06/12/2016  HGB 8.7* 06/12/2016   HCT 26.5* 06/12/2016   MCV 93.0 06/12/2016   PLT 101* 06/12/2016      Chemistry      Component Value Date/Time   NA 129* 06/12/2016 0932   NA 132* 06/08/2016 0700   K 4.8 06/12/2016 0932   K 3.8 06/08/2016 0700   CL 97* 06/08/2016 0700   CO2 25 06/12/2016 0932   CO2 28 06/08/2016 0700   BUN 30.1* 06/12/2016 0932   BUN 14 06/08/2016 0700   CREATININE 4.3* 06/12/2016 0932   CREATININE 4.05* 06/08/2016 0700      Component Value Date/Time   CALCIUM 8.1* 06/12/2016 0932   CALCIUM 8.1* 06/08/2016 0700   ALKPHOS 114 06/12/2016 0932   ALKPHOS 104 06/01/2016 0456   AST 15 06/12/2016 0932   AST 20 06/01/2016 0456   ALT 12 06/12/2016 0932   ALT 20 06/01/2016 0456   BILITOT 0.71 06/12/2016  0932   BILITOT 1.6* 06/01/2016 0456      ASSESSMENT & PLAN:  Multiple myeloma not having achieved remission ([place]) We reviewed the results of bone marrow FISH which showed complex cytogenetics. Now that he is discharged, I recommend additional Revlimid. He felt that his kidney function is improving. If he is able to get off dialysis, he might be a candidate for possible autologous stem cell transplant in the future. He will continue pulsed dexamethasone every week and Velcade on days 1, 4, 7 and 11. He will take Revlimid on days 1-14, rest 7 days. He will take vitamin D, calcium, aspirin for DVT prophylaxis and acyclovir for antiviral prophylaxis. Due to renal failure, I will avoid IV bisphosphonates. He will benefit from radiation therapy to the lesion on his spine and the left femur. I will see him prior to cycle 3 of therapy  Antineoplastic chemotherapy induced pancytopenia ([place]) He is asymptomatic from pancytopenia. He will get blood transfusion as needed through the dialysis center and will get darbepoetin for now  Vitamin B12 deficiency He was started on vitamin B-12 injection in the hospital. I will recheck vitamin B-12 level today to determine if he would to need parenteral B-12 will not. He will continue high-dose vitamin B-12 supplement by mouth  Closed fracture of shaft of left femur ([place]) He has prophylactic surgery. He may benefit from prophylactic radiation therapy in the left femur. He will continue high-dose vitamin D replacement therapy for now  ESRD (end stage renal disease) on dialysis [facility]) He is receiving hemodialysis in the outpatient on Tuesdays, Thursdays and Saturdays. We will adjust his treatment based on his dialysis schedule for now  Protein-calorie malnutrition, moderate ([place]) He is eating better and is gaining weight. I continue to encourage him to increase oral intake as tolerated   No orders of the defined types were placed in this encounter.    All questions were answered. The patient knows to call the clinic with any problems, questions or concerns. No barriers to learning was detected. I spent 30 minutes counseling the patient face to face. The total time spent in the appointment was 55 minutes and more than 50% was on counseling and review of test results     [facility], Dulcinea Kinser, MD 06/12/2016 1:48 PM

## 2016-06-12 NOTE — Telephone Encounter (Signed)
Gave and printed appt sched and avs for pt for July and Aug °

## 2016-06-12 NOTE — Patient Instructions (Signed)
[facility] Discharge Instructions for Patients Receiving Chemotherapy  Today you received the following chemotherapy agents Velcade. To help prevent nausea and vomiting after your treatment, we encourage you to take your nausea medication as directed.  If you develop nausea and vomiting that is not controlled by your nausea medication, call the clinic.   BELOW ARE SYMPTOMS THAT SHOULD BE REPORTED IMMEDIATELY:  *FEVER GREATER THAN 100.5 F  *CHILLS WITH OR WITHOUT FEVER  NAUSEA AND VOMITING THAT IS NOT CONTROLLED WITH YOUR NAUSEA MEDICATION  *UNUSUAL SHORTNESS OF BREATH  *UNUSUAL BRUISING OR BLEEDING  TENDERNESS IN MOUTH AND THROAT WITH OR WITHOUT PRESENCE OF ULCERS  *URINARY PROBLEMS  *BOWEL PROBLEMS  UNUSUAL RASH Items with * indicate a potential emergency and should be followed up as soon as possible.  Feel free to call the clinic you have any questions or concerns. The clinic phone number is [phone].  Please show the CHEMO ALERT CARD at check-in to the Emergency Department and triage nurse.    

## 2016-06-12 NOTE — Assessment & Plan Note (Signed)
He was started on vitamin B-12 injection in the hospital. I will recheck vitamin B-12 level today to determine if he would to need parenteral B-12 will not. He will continue high-dose vitamin B-12 supplement by mouth

## 2016-06-12 NOTE — Progress Notes (Signed)
Oral Chemotherapy Pharmacist Encounter  New prescription for Revlimid has been sent to [facility] for benefit analysis and approval.   Will follow up with patient regarding insurance and pharmacy.  Once pt starts Revlimid, we will follow up in 1-2 weeks for adherence and toxicity management.   Thank you, Kennith Center, Pharm.D., CPP 06/12/2016@4 :41 PM Oral Chemotherapy Clinic

## 2016-06-12 NOTE — Assessment & Plan Note (Signed)
He is receiving hemodialysis in the outpatient on Tuesdays, Thursdays and Saturdays. We will adjust his treatment based on his dialysis schedule for now

## 2016-06-12 NOTE — Assessment & Plan Note (Signed)
He is eating better and is gaining weight. I continue to encourage him to increase oral intake as tolerated

## 2016-06-12 NOTE — Progress Notes (Signed)
Revlimid RX to [facility] pharmacist. Authorization number obtained [id number]

## 2016-06-12 NOTE — Assessment & Plan Note (Signed)
He has prophylactic surgery. He may benefit from prophylactic radiation therapy in the left femur. He will continue high-dose vitamin D replacement therapy for now

## 2016-06-12 NOTE — Assessment & Plan Note (Signed)
He is asymptomatic from pancytopenia. He will get blood transfusion as needed through the dialysis center and will get darbepoetin for now

## 2016-06-12 NOTE — Assessment & Plan Note (Signed)
We reviewed the results of bone marrow FISH which showed complex cytogenetics. Now that he is discharged, I recommend additional Revlimid. He felt that his kidney function is improving. If he is able to get off dialysis, he might be a candidate for possible autologous stem cell transplant in the future. He will continue pulsed dexamethasone every week and Velcade on days 1, 4, 7 and 11. He will take Revlimid on days 1-14, rest 7 days. He will take vitamin D, calcium, aspirin for DVT prophylaxis and acyclovir for antiviral prophylaxis. Due to renal failure, I will avoid IV bisphosphonates. He will benefit from radiation therapy to the lesion on his spine and the left femur. I will see him prior to cycle 3 of therapy

## 2016-06-13 LAB — VITAMIN B12: Vitamin B12: 823 pg/mL (ref 211–946)

## 2016-06-14 ENCOUNTER — Telehealth: Payer: Self-pay | Admitting: Pharmacist

## 2016-06-14 LAB — KAPPA/LAMBDA LIGHT CHAINS
Ig Lambda Free Light Chain: 11.8 mg/L (ref 5.7–26.3)
Kappa/Lambda FluidC Ratio: 216.93 — ABNORMAL HIGH (ref 0.26–1.65)

## 2016-06-14 NOTE — Telephone Encounter (Signed)
Received VM from [facility] at [employer] that the Revlimid Rx needs PA. [employer] clinical team will do this for our office. They need copies of labs, clinic notes and tried and failed treatments. I faxed the requested information to [facility] [employer] at [phone]. (Phone:  [phone]) Fax confirmation received.  Raul Del, PharmD, [place], [facility] [phone]

## 2016-06-15 ENCOUNTER — Ambulatory Visit (HOSPITAL_BASED_OUTPATIENT_CLINIC_OR_DEPARTMENT_OTHER): Payer: PPO

## 2016-06-15 ENCOUNTER — Telehealth: Payer: Self-pay | Admitting: *Deleted

## 2016-06-15 ENCOUNTER — Encounter: Payer: Self-pay | Admitting: Hematology and Oncology

## 2016-06-15 VITALS — BP 144/66 | HR 86 | Temp 98.8°F | Resp 18

## 2016-06-15 DIAGNOSIS — Z5112 Encounter for antineoplastic immunotherapy: Secondary | ICD-10-CM

## 2016-06-15 DIAGNOSIS — C9000 Multiple myeloma not having achieved remission: Secondary | ICD-10-CM | POA: Diagnosis not present

## 2016-06-15 LAB — MULTIPLE MYELOMA PANEL, SERUM
ALBUMIN SERPL ELPH-MCNC: 2.9 g/dL (ref 2.9–4.4)
Albumin/Glob SerPl: 0.7 (ref 0.7–1.7)
Alpha 1: 0.3 g/dL (ref 0.0–0.4)
Alpha2 Glob SerPl Elph-Mcnc: 0.7 g/dL (ref 0.4–1.0)
B-GLOBULIN SERPL ELPH-MCNC: 0.6 g/dL — AB (ref 0.7–1.3)
Gamma Glob SerPl Elph-Mcnc: 2.8 g/dL — ABNORMAL HIGH (ref 0.4–1.8)
Globulin, Total: 4.4 g/dL — ABNORMAL HIGH (ref 2.2–3.9)
IGM (IMMUNOGLOBIN M), SRM: 10 mg/dL — AB (ref 20–172)
IgA, Qn, Serum: 17 mg/dL — ABNORMAL LOW (ref 61–437)
IgG, Qn, Serum: 2465 mg/dL — ABNORMAL HIGH (ref 700–1600)
M PROTEIN SERPL ELPH-MCNC: 2.6 g/dL — AB
TOTAL PROTEIN: 7.3 g/dL (ref 6.0–8.5)

## 2016-06-15 MED ORDER — PROCHLORPERAZINE MALEATE 10 MG PO TABS
ORAL_TABLET | ORAL | Status: AC
  Filled 2016-06-15: qty 1

## 2016-06-15 MED ORDER — BORTEZOMIB CHEMO SQ INJECTION 3.5 MG (2.5MG/ML)
1.3000 mg/m2 | Freq: Once | INTRAMUSCULAR | Status: AC
  Administered 2016-06-15: 2.5 mg via SUBCUTANEOUS
  Filled 2016-06-15: qty 2.5

## 2016-06-15 MED ORDER — PROCHLORPERAZINE MALEATE 10 MG PO TABS
10.0000 mg | ORAL_TABLET | Freq: Once | ORAL | Status: AC
  Administered 2016-06-15: 10 mg via ORAL

## 2016-06-15 NOTE — Patient Instructions (Signed)
[facility] Discharge Instructions for Patients Receiving Chemotherapy  Today you received the following chemotherapy agents Velcade. To help prevent nausea and vomiting after your treatment, we encourage you to take your nausea medication as directed.  If you develop nausea and vomiting that is not controlled by your nausea medication, call the clinic.   BELOW ARE SYMPTOMS THAT SHOULD BE REPORTED IMMEDIATELY:  *FEVER GREATER THAN 100.5 F  *CHILLS WITH OR WITHOUT FEVER  NAUSEA AND VOMITING THAT IS NOT CONTROLLED WITH YOUR NAUSEA MEDICATION  *UNUSUAL SHORTNESS OF BREATH  *UNUSUAL BRUISING OR BLEEDING  TENDERNESS IN MOUTH AND THROAT WITH OR WITHOUT PRESENCE OF ULCERS  *URINARY PROBLEMS  *BOWEL PROBLEMS  UNUSUAL RASH Items with * indicate a potential emergency and should be followed up as soon as possible.  Feel free to call the clinic you have any questions or concerns. The clinic phone number is [phone].  Please show the CHEMO ALERT CARD at check-in to the Emergency Department and triage nurse.    

## 2016-06-15 NOTE — Telephone Encounter (Signed)
-----   Message from Heath Lark, MD sent at 06/15/2016  2:41 PM EDT ----- Regarding: revlimid Has he started taking Revlimid yet?

## 2016-06-15 NOTE — Telephone Encounter (Signed)
Left message for patient to see if he has started Revlimid.

## 2016-06-15 NOTE — Progress Notes (Signed)
Per [facility] revlimid fax recd- approved 06/15/16-12/10/16.

## 2016-06-16 ENCOUNTER — Telehealth: Payer: Self-pay

## 2016-06-16 NOTE — Telephone Encounter (Signed)
Spoke with [employer] [facility] about Revlimid prescription and the PA has been approved.  Representative ran a claim which came out with a $2859.00 copay.  Representative is going to send this information to their benefits team to reach out the Mr Valasek and apply for copay assistance for him.  We will contact them 06/19/16 to check the status of the prescription.  Thank you,  Henreitta Leber, PharmD [facility]

## 2016-06-19 ENCOUNTER — Ambulatory Visit (HOSPITAL_BASED_OUTPATIENT_CLINIC_OR_DEPARTMENT_OTHER): Payer: PPO

## 2016-06-19 ENCOUNTER — Encounter: Payer: Self-pay | Admitting: Pharmacist

## 2016-06-19 ENCOUNTER — Other Ambulatory Visit (HOSPITAL_BASED_OUTPATIENT_CLINIC_OR_DEPARTMENT_OTHER): Payer: PPO

## 2016-06-19 ENCOUNTER — Telehealth: Payer: Self-pay | Admitting: *Deleted

## 2016-06-19 ENCOUNTER — Other Ambulatory Visit: Payer: Self-pay | Admitting: Hematology and Oncology

## 2016-06-19 VITALS — BP 143/74 | HR 89 | Temp 98.4°F | Resp 18

## 2016-06-19 DIAGNOSIS — C9000 Multiple myeloma not having achieved remission: Secondary | ICD-10-CM

## 2016-06-19 DIAGNOSIS — Z5112 Encounter for antineoplastic immunotherapy: Secondary | ICD-10-CM | POA: Diagnosis not present

## 2016-06-19 LAB — COMPREHENSIVE METABOLIC PANEL
ALK PHOS: 120 U/L (ref 40–150)
ALT: 14 U/L (ref 0–55)
ANION GAP: 8 meq/L (ref 3–11)
AST: 17 U/L (ref 5–34)
Albumin: 2.6 g/dL — ABNORMAL LOW (ref 3.5–5.0)
BUN: 30.4 mg/dL — AB (ref 7.0–26.0)
CALCIUM: 8.4 mg/dL (ref 8.4–10.4)
CHLORIDE: 97 meq/L — AB (ref 98–109)
CO2: 26 mEq/L (ref 22–29)
Creatinine: 4.3 mg/dL (ref 0.7–1.3)
EGFR: 13 mL/min/{1.73_m2} — AB (ref >90)
Glucose: 130 mg/dl (ref 70–140)
POTASSIUM: 5.1 meq/L (ref 3.5–5.1)
Sodium: 130 mEq/L — ABNORMAL LOW (ref 136–145)
Total Bilirubin: 0.55 mg/dL (ref 0.20–1.20)
Total Protein: 8.2 g/dL (ref 6.4–8.3)

## 2016-06-19 LAB — CBC WITH DIFFERENTIAL/PLATELET
BASO%: 0.5 % (ref 0.0–2.0)
Basophils Absolute: 0 10*3/uL (ref 0.0–0.1)
EOS%: 0.5 % (ref 0.0–7.0)
Eosinophils Absolute: 0 10*3/uL (ref 0.0–0.5)
HEMATOCRIT: 27.4 % — AB (ref 38.4–49.9)
HEMOGLOBIN: 9 g/dL — AB (ref 13.0–17.1)
LYMPH#: 0.9 10*3/uL (ref 0.9–3.3)
LYMPH%: 19.9 % (ref 14.0–49.0)
MCH: 30.1 pg (ref 27.2–33.4)
MCHC: 32.7 g/dL (ref 32.0–36.0)
MCV: 92.1 fL (ref 79.3–98.0)
MONO#: 0.5 10*3/uL (ref 0.1–0.9)
MONO%: 12 % (ref 0.0–14.0)
NEUT%: 67.1 % (ref 39.0–75.0)
NEUTROS ABS: 2.9 10*3/uL (ref 1.5–6.5)
Platelets: 83 10*3/uL — ABNORMAL LOW (ref 140–400)
RBC: 2.98 10*6/uL — ABNORMAL LOW (ref 4.20–5.82)
RDW: 17.2 % — AB (ref 11.0–14.6)
WBC: 4.4 10*3/uL (ref 4.0–10.3)

## 2016-06-19 MED ORDER — PROCHLORPERAZINE MALEATE 10 MG PO TABS
ORAL_TABLET | ORAL | Status: AC
Start: 2016-06-19 — End: 2016-06-19
  Filled 2016-06-19: qty 1

## 2016-06-19 MED ORDER — PROCHLORPERAZINE MALEATE 10 MG PO TABS
10.0000 mg | ORAL_TABLET | Freq: Once | ORAL | Status: AC
  Administered 2016-06-19: 10 mg via ORAL

## 2016-06-19 MED ORDER — BORTEZOMIB CHEMO SQ INJECTION 3.5 MG (2.5MG/ML)
1.3000 mg/m2 | Freq: Once | INTRAMUSCULAR | Status: AC
  Administered 2016-06-19: 2.5 mg via SUBCUTANEOUS
  Filled 2016-06-19: qty 2.5

## 2016-06-19 NOTE — Telephone Encounter (Signed)
Wife reports pt's diarrhea improving slowly.  She says she s/w nurse on Friday who instructed for pt to hold Reglan and take Imodium AD OTC as directed.  Pt had 4 "runny" stools on Saturday and took 3 imodium.  He had "soft" stools x 4 on Sunday but did not take any imodium since stools were more formed.  He has had one stool so far this morning which is still soft.   Instructed wife to have pt continue to hold Reglan and only take imodium if stools are runny.  Make sure pt is drinking plenty of fluids.  It sounds like his diarrhea is improving.  Please let us know if it starts to worsen again.  She verbalized understanding and pt will be in this afternoon for his Velcade as scheduled in case Dr. Alvy Bimler has any further instructions. Also she did receive call from [employer] and has called them back this morning and left message regarding Revlimid. [employer] will assist in applying for co-pay assistance.  Asked wife to let us know when pt receives Revlimid.  She verbalized understanding.

## 2016-06-19 NOTE — Progress Notes (Signed)
I contacted [employer] (s/w [facility]; ph# [phone]) and was informed pt was just approved this afternono for [place] in the amt of $10,000 for 1 year.  They are processing his Rx today and should ship out tomorrow.  A pharmacist from [employer] will contact pt today or tomorrow.  I will f/u on 06/21/16 w/ pt and counsel pt on Revlimid if not already provided by [employer].  Kennith Center, Pharm.D., CPP 06/19/2016@4 :00 PM Oral Chemo Clinic

## 2016-06-19 NOTE — Patient Instructions (Signed)
[facility] Discharge Instructions for Patients Receiving Chemotherapy  Today you received the following chemotherapy agents Velcade. To help prevent nausea and vomiting after your treatment, we encourage you to take your nausea medication as directed.  If you develop nausea and vomiting that is not controlled by your nausea medication, call the clinic.   BELOW ARE SYMPTOMS THAT SHOULD BE REPORTED IMMEDIATELY:  *FEVER GREATER THAN 100.5 F  *CHILLS WITH OR WITHOUT FEVER  NAUSEA AND VOMITING THAT IS NOT CONTROLLED WITH YOUR NAUSEA MEDICATION  *UNUSUAL SHORTNESS OF BREATH  *UNUSUAL BRUISING OR BLEEDING  TENDERNESS IN MOUTH AND THROAT WITH OR WITHOUT PRESENCE OF ULCERS  *URINARY PROBLEMS  *BOWEL PROBLEMS  UNUSUAL RASH Items with * indicate a potential emergency and should be followed up as soon as possible.  Feel free to call the clinic you have any questions or concerns. The clinic phone number is [phone].  Please show the CHEMO ALERT CARD at check-in to the Emergency Department and triage nurse.    

## 2016-06-19 NOTE — Progress Notes (Signed)
Telephone call from Dr Alvy Bimler, ok to treat today with Velcade, based on pts labs from today.

## 2016-06-20 ENCOUNTER — Telehealth: Payer: Self-pay | Admitting: *Deleted

## 2016-06-20 NOTE — Telephone Encounter (Signed)
Wife reports pt started having increase in loose stools again last night.  He had total 5 stools yesterday and has had 3 since 1 am this morning.  They are loose to runny again and pt did take 2 imodium this morning.  Notified Dr. Alvy Bimler and she instructs for pt to continue to take imodium AD as directed for loose stools, up to max 8 tablets per day.   If it is side effect of Velcade, pt may need to take scheduled for a day or two after each injection.   Instructed wife for pt to continue imodium as directed max 8 tabs per day and call us if it does not work and it symptoms worsen.  She verbalized understanding.

## 2016-06-21 ENCOUNTER — Telehealth: Payer: Self-pay | Admitting: *Deleted

## 2016-06-21 NOTE — Progress Notes (Signed)
Mr. Shehata is scheduled for a patient visit and simulation   Multiple Myeloma: 6 /2/17 skeletal survey showed multiple lytic lesions and 05/13/16 Lt femur film showed a lytic mid-shaft lucency without fracture. Biospy on 05/16/16  Surgical fixation -  05/14/16 - INTRAMEDULLARY (IM) NAIL FEMORAL (Left Leg Upper)  Pain:None  Mobility:Wheelchair  Other: Dialysis - last on Tuesday. Schedule Tuesday, Thursday and ?Saturday 

## 2016-06-21 NOTE — Telephone Encounter (Signed)
Wife states pt received Revlimid today.  Notified Dr. Alvy Bimler and she instructs for pt to wait to start Revlimid on Day #1 of next Velcade cycle which will be on 7/24.   Wife verbalized understanding.   She reports pt had to take imodium three times yesterday for loose stools and once this morning.  Imodium is helping and she understands to let us know if it doesn't help or diarrhea gets worse.

## 2016-06-22 ENCOUNTER — Ambulatory Visit
Admission: RE | Admit: 2016-06-22 | Discharge: 2016-06-22 | Disposition: A | Discharge status: Home / Self Care | Payer: PPO | Source: Ambulatory Visit | Attending: Radiation Oncology | Admitting: Radiation Oncology

## 2016-06-22 ENCOUNTER — Telehealth: Payer: Self-pay | Admitting: Hematology and Oncology

## 2016-06-22 ENCOUNTER — Telehealth: Payer: Self-pay | Admitting: Physical Medicine & Rehabilitation

## 2016-06-22 ENCOUNTER — Encounter: Payer: Self-pay | Admitting: Radiation Oncology

## 2016-06-22 ENCOUNTER — Ambulatory Visit (HOSPITAL_BASED_OUTPATIENT_CLINIC_OR_DEPARTMENT_OTHER): Payer: PPO

## 2016-06-22 VITALS — HR 89 | Temp 98.4°F | Resp 18

## 2016-06-22 VITALS — BP 138/62 | HR 86 | Temp 98.1°F | Ht 73.0 in | Wt 163.2 lb

## 2016-06-22 DIAGNOSIS — C9000 Multiple myeloma not having achieved remission: Secondary | ICD-10-CM

## 2016-06-22 DIAGNOSIS — T451X5A Adverse effect of antineoplastic and immunosuppressive drugs, initial encounter: Secondary | ICD-10-CM

## 2016-06-22 DIAGNOSIS — Z510 Encounter for antineoplastic radiation therapy: Secondary | ICD-10-CM | POA: Diagnosis not present

## 2016-06-22 DIAGNOSIS — Z5112 Encounter for antineoplastic immunotherapy: Secondary | ICD-10-CM | POA: Diagnosis not present

## 2016-06-22 DIAGNOSIS — M84552D Pathological fracture in neoplastic disease, left femur, subsequent encounter for fracture with routine healing: Secondary | ICD-10-CM

## 2016-06-22 DIAGNOSIS — D61810 Antineoplastic chemotherapy induced pancytopenia: Secondary | ICD-10-CM

## 2016-06-22 MED ORDER — BORTEZOMIB CHEMO SQ INJECTION 3.5 MG (2.5MG/ML)
1.3000 mg/m2 | Freq: Once | INTRAMUSCULAR | Status: AC
  Administered 2016-06-22: 2.5 mg via SUBCUTANEOUS
  Filled 2016-06-22: qty 2.5

## 2016-06-22 MED ORDER — PROCHLORPERAZINE MALEATE 10 MG PO TABS
ORAL_TABLET | ORAL | Status: AC
  Filled 2016-06-22: qty 1

## 2016-06-22 MED ORDER — PROCHLORPERAZINE MALEATE 10 MG PO TABS
10.0000 mg | ORAL_TABLET | Freq: Once | ORAL | Status: AC
  Administered 2016-06-22: 10 mg via ORAL

## 2016-06-22 NOTE — Progress Notes (Signed)
Radiation Oncology         [phone] ________________________________  Follow Up  Name: Thomas [name] MRN: [id number]  Date: 06/22/2016  DOB: 29-Aug-1946  CC:No PCP Per Patient  Consuella Lose, MD   REFERRING PHYSICIAN: Consuella Lose, MD  DIAGNOSIS: The primary encounter diagnosis was Multiple myeloma not having achieved remission ([place]). 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    ICD-9-CM ICD-10-CM   1. Multiple myeloma not having achieved remission (H[facility]) 203.00 C90.00   2. Antineoplastic chemotherapy induced pancytopenia (HCC) 284.11 D61.810    E933.1    3. 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 male with a recent diagnosis of multiple myeloma seen at the request of Dr. Alvy Bimler. He presented to urgent care and was noted to have renal failure and subsequently was admitted to [facility] on 05/10/2016 for further evaluation. The patient is otherwise healthy up until about a month prior when he developed significant back pain and has been taking NSAIDs. The back pain is located in the lower back radiating down to the left leg. Chest x-ray on 05/10/2016 show abnormal pleural-based mass or expansile lesion in the left lower rib.Lumbar x-ray show abnormal lucency in the left L5 vertebral with evidence of compression fracture at L3 Skull x-ray on 05/11/2016 is positive for lytic lesions. 05/12/16 skeletal survey showed multiple lytic lesions, and on 05/13/16 Lt femur film showed a lytic mid-shaft lucency without fracture. The patient started hemodialysis soon due to evidence of renal failure. On 05/14/16 he had intramedullary fixation of the left femur with biopsy. On 05/15/16 total  spine MRI showed L3 compression fracture and L5 lesion with significant canal and paraspinal component, and on 05/16/16 he had CT biopsy of left iliac bone which revealed  Myeloma. He was seen in the hospital on 05/16/16 and on 05/30/16 and was still in the process of beginning Velcade and rehabing. He has been out of the hospital now and is ready to consider radiotherapy to the lumbar spine and left femur. He comes today to re-establish himself in our clinic.               PREVIOUS RADIATION THERAPY: No  PAST MEDICAL HISTORY:  Past Medical History  Diagnosis Date  . Compression fracture   . Chronic kidney disease   . Cancer [facility])       PAST SURGICAL HISTORY: Past Surgical History  Procedure Laterality Date  . Mouth surgery    . Femur im nail Left 05/14/2016    Procedure: INTRAMEDULLARY (IM) NAIL FEMORAL;  Surgeon: Rod Can, MD;  Location: [place];  Service: Orthopedics;  Laterality: Left;  . Bone biopsy Left 05/14/2016    Procedure: LEFT FEMORAL BIOPSY WITH INTRAOPERATIVE FROZEN SECTIONS ;  Surgeon: Rod Can, MD;  Location: [place];  Service: Orthopedics;  Laterality: Left;  . Av fistula placement Left 05/24/2016    Procedure: ARTERIOVENOUS (AV) FISTULA CREATION-LEFT;  Surgeon: Conrad Hillsboro, MD;  Location: [facility] OR;  Service: Vascular;  Laterality: Left;    FAMILY HISTORY:  Family History  Problem Relation Age of Onset  . Diabetes Father     SOCIAL HISTORY:  Social History   Social History  . Marital Status: Married    Spouse Name: N/A  .  Number of Children: N/A  . Years of Education: N/A   Occupational History  . Not on file.   Social History Main Topics  . Smoking status: Former Research scientist (life sciences)  . Smokeless tobacco: Not on file  . Alcohol Use: No  . Drug Use: No  . Sexual Activity: Not on file   Other Topics Concern  . Not on file   Social History Narrative    ALLERGIES: Penicillins; Dilaudid; and Codeine  MEDICATIONS:  Current Outpatient Prescriptions    Medication Sig Dispense Refill  . acetaminophen (TYLENOL) 325 MG tablet Take 2 tablets (650 mg total) by mouth every 6 (six) hours as needed for mild pain (or Fever >/= 101). (Patient not taking: Reported on 06/12/2016) 15 tablet 0  . acyclovir (ZOVIRAX) 200 MG capsule Take 1 capsule (200 mg total) by mouth 2 (two) times daily. 60 capsule 9  . ALPRAZolam (XANAX) 0.25 MG tablet Take 1 tablet (0.25 mg total) by mouth 2 (two) times daily as needed for anxiety. 30 tablet 0  . calcium acetate (PHOSLO) 667 MG capsule Take 1 capsule (667 mg total) by mouth 2 (two) times daily with a meal. 60 capsule 1  . cyanocobalamin 1000 MCG tablet Take 1 tablet (1,000 mcg total) by mouth daily. 30 tablet 0  . dexamethasone (DECADRON) 4 MG tablet Take 10 tablets (40 mg total) by mouth once a week. 60 tablet 0  . hydroxypropyl methylcellulose / hypromellose (ISOPTO TEARS / GONIOVISC) 2.5 % ophthalmic solution Place 2 drops into both eyes 4 (four) times daily as needed for dry eyes. (Patient not taking: Reported on 06/12/2016) 15 mL 12  . lenalidomide (REVLIMID) 2.5 MG capsule Take 1 capsule daily for 14 days, then off 7 days 14 capsule 11  . loratadine (CLARITIN) 10 MG tablet Take 10 mg by mouth daily.    . methocarbamol (ROBAXIN) 500 MG tablet Take 0.5 tablets (250 mg total) by mouth every 6 (six) hours as needed for muscle spasms. (Patient not taking: Reported on 06/12/2016) 30 tablet 0  . metoCLOPramide (REGLAN) 5 MG tablet Take 1 tablet (5 mg total) by mouth 3 (three) times daily before meals. 90 tablet 0  . multivitamin (RENA-VIT) TABS tablet Take 1 tablet by mouth at bedtime. 30 tablet 0  . oxyCODONE-acetaminophen (PERCOCET/ROXICET) 5-325 MG tablet Take 1-2 tablets by mouth every 6 (six) hours as needed for moderate pain. (Patient not taking: Reported on 06/12/2016) 60 tablet 0  . pantoprazole (PROTONIX) 40 MG tablet Take 1 tablet (40 mg total) by mouth daily. 30 tablet 1  . polyethylene glycol (MIRALAX / GLYCOLAX) packet  Take 17 g by mouth daily. (Patient not taking: Reported on 06/12/2016) 14 each 0  . Vitamin D, Ergocalciferol, (DRISDOL) 50000 units CAPS capsule Take 1 capsule (50,000 Units total) by mouth every 7 (seven) days. 60 capsule 0   No current facility-administered medications for this encounter.    REVIEW OF SYSTEMS:  On review of systems, the patient reports that he is doing well overall. He has hemodialysis on Tuesday Thursday Saturday, and reports that this is been going well. He continues to have some discomfort in his low back but denies any numbness or tingling in his lower extremities or difficulty with incontinent episodes. He denies any chest pain, shortness of breath, cough, fevers, chills, night sweats, unintended weight changes. He denies any bowel or bladder disturbances, and denies abdominal pain, nausea or vomiting. He denies any new musculoskeletal or joint aches or pains. A complete review of systems  is obtained and is otherwise negative.    PHYSICAL EXAM:  vitals were not taken for this visit.  Pain Scale 0/10 In general this is a Thin appearing Caucasian male in no acute distress. He's alert and oriented x4 and appropriate throughout the examination. Cardiopulmonary assessment is negative for acute distress and he exhibits normal effort.    KPS = 70  100 - Normal; no complaints; no evidence of disease. 90   - Able to carry on normal activity; minor signs or symptoms of disease. 80   - Normal activity with effort; some signs or symptoms of disease. 52   - Cares for self; unable to carry on normal activity or to do active work. 60   - Requires occasional assistance, but is able to care for most of his personal needs. 50   - Requires considerable assistance and frequent medical care. 61   - Disabled; requires special care and assistance. 76   - Severely disabled; hospital admission is indicated although death not imminent. 58   - Very sick; hospital admission necessary; active  supportive treatment necessary. 10   - Moribund; fatal processes progressing rapidly. 0     - Dead  Karnofsky DA, Abelmann Felton, Craver LS and Burchenal [facility] [phone]) The use of the nitrogen mustards in the palliative treatment of carcinoma: with particular reference to bronchogenic carcinoma Cancer 1 634-56  LABORATORY DATA:  Lab Results  Component Value Date   WBC 4.4 06/19/2016   HGB 9.0* 06/19/2016   HCT 27.4* 06/19/2016   MCV 92.1 06/19/2016   PLT 83* 06/19/2016   Lab Results  Component Value Date   NA 130* 06/19/2016   K 5.1 06/19/2016   CL 97* 06/08/2016   CO2 26 06/19/2016   Lab Results  Component Value Date   ALT 14 06/19/2016   AST 17 06/19/2016   ALKPHOS 120 06/19/2016   BILITOT 0.55 06/19/2016     RADIOGRAPHY: Dg Chest Port 1 View  05/24/2016  CLINICAL DATA:  Cough and congestion. EXAM: PORTABLE CHEST 1 VIEW COMPARISON:  05/17/2016. FINDINGS: Right IJ sheath in stable position. Mediastinum and hilar structures normal. Cardiomegaly with mild pulmonary venous congestion . Progressive left lower lobe infiltrate consistent with pneumonia and/or asymmetric pulmonary edema. Mild increased interstitial markings noted bilaterally. Persistent left pleural effusion. No pneumothorax. IMPRESSION: 1.  Right IJ sheath in stable position. 2. Cardiomegaly with pulmonary venous congestion and mild increase in interstitial markings noted diffusely consistent with congestive heart failure. Progressive left lower lobe infiltrate consistent with pneumonia and/or asymmetric pulmonary edema. Small left pleural effusion. Electronically Signed   By: Marcello Moores  Register   On: 05/24/2016 07:24   Dg Abd 2 Views  06/01/2016  CLINICAL DATA:  Constipation.  Nausea.  Recent surgery. EXAM: ABDOMEN - 2 VIEW COMPARISON:  None. FINDINGS: No dilated small bowel loops. Mild to moderate colorectal stool volume. No evidence of pneumatosis or pneumoperitoneum. Moderate thoracolumbar spondylosis. Partially visualized  surgical hardware in the left proximal femur. No pathologic soft tissue calcifications. IMPRESSION: Nonobstructive bowel gas pattern. Mild to moderate colorectal stool volume. Electronically Signed   By: Ilona Sorrel M.D.   On: 06/01/2016 09:46      IMPRESSION:  70 y.o. 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   PLAN: Dr. Tammi Klippel discusses the findings from the patient's imaging and outlines the utilization of radiotherapy for his disease. Dr. Tammi Klippel discusses the options for treatment for local  control and pain relief. He would recommendation 30 Gy to the left femur and lumbar spine at L4 through the SI joint over 10 fractions, and have detailed the risks, benefits, short, and long term effects of radiotherapy. He is interested in proceeding and is set up for simulation today. At the end of the conversation, all questions were answered to satisfaction the patient and his family.  The above documentation reflects my direct findings during this shared patient visit. Please see the separate note by Dr. Tammi Klippel on this date for the remainder of the patient's plan of care.   Carola Rhine, PAC

## 2016-06-22 NOTE — Telephone Encounter (Signed)
Thomas [name] with [facility] called to let us know that patient denied any further visits with him.  Any questions please call Doug at [phone].

## 2016-06-22 NOTE — Progress Notes (Signed)
Pt has new complaints of small amounts of blood clots last night. Pt states that he has been experiencing diarrhea since last Friday. Notified Dr. Alvy Bimler, and will come evaluate pt in the infusion room.   Pt to hold off asa for now, per Dr. Alvy Bimler. Pt and spouse aware of plan.  Ok to continue to velcade treatment today per MD.

## 2016-06-22 NOTE — Telephone Encounter (Signed)
I spoke with the patient briefly. He had 2 loose bowel movement this morning with associated clot. He complained of anal pain after recent bowel movement. I suspect he may have hemorrhoids or fissures. He did complain of remote history of hemorrhoidal bleeding. I recommend local care, avoid excessive wiping and to stop aspirin. He will proceed with treatment today

## 2016-06-22 NOTE — Addendum Note (Signed)
Encounter addended by: Benn Moulder, RN on: 06/22/2016  9:42 AM<BR>     Documentation filed: Arn Medal VN

## 2016-06-22 NOTE — Patient Instructions (Signed)
[facility] Discharge Instructions for Patients Receiving Chemotherapy  Today you received the following chemotherapy agents Velcade. To help prevent nausea and vomiting after your treatment, we encourage you to take your nausea medication as directed.  If you develop nausea and vomiting that is not controlled by your nausea medication, call the clinic.   BELOW ARE SYMPTOMS THAT SHOULD BE REPORTED IMMEDIATELY:  *FEVER GREATER THAN 100.5 F  *CHILLS WITH OR WITHOUT FEVER  NAUSEA AND VOMITING THAT IS NOT CONTROLLED WITH YOUR NAUSEA MEDICATION  *UNUSUAL SHORTNESS OF BREATH  *UNUSUAL BRUISING OR BLEEDING  TENDERNESS IN MOUTH AND THROAT WITH OR WITHOUT PRESENCE OF ULCERS  *URINARY PROBLEMS  *BOWEL PROBLEMS  UNUSUAL RASH Items with * indicate a potential emergency and should be followed up as soon as possible.  Feel free to call the clinic you have any questions or concerns. The clinic phone number is [phone].  Please show the CHEMO ALERT CARD at check-in to the Emergency Department and triage nurse.    

## 2016-06-22 NOTE — Addendum Note (Signed)
Encounter addended by: Benn Moulder, RN on: 06/22/2016  9:41 AM<BR>     Documentation filed: Arn Medal VN

## 2016-06-22 NOTE — Addendum Note (Signed)
Encounter addended by: Benn Moulder, RN on: 06/22/2016  9:37 AM<BR>     Documentation filed: Charges VN, Vitals Section

## 2016-06-22 NOTE — Progress Notes (Signed)
  Radiation Oncology         [phone] ________________________________  Name: Thomas [name] MRN: [id number]  Date: 06/22/2016  DOB: 1946/10/04  SIMULATION AND TREATMENT PLANNING NOTE    ICD-9-CM ICD-10-CM   1. Multiple myeloma not having achieved remission ([place]) 203.00 C90.00     DIAGNOSIS:  70 yo man with myeloma and symptomatic involvement of lumbar spine and femur  NARRATIVE:  The patient was brought to the [facility].  Identity was confirmed.  All relevant records and images related to the planned course of therapy were reviewed.  The patient freely provided informed written consent to proceed with treatment after reviewing the details related to the planned course of therapy. The consent form was witnessed and verified by the simulation staff.  Then, the patient was set-up in a stable reproducible  supine position for radiation therapy.  CT images were obtained.  Surface markings were placed.  The CT images were loaded into the planning software.  Then the target and avoidance structures were contoured.  Treatment planning then occurred.  The radiation prescription was entered and confirmed.  Then, I designed and supervised the construction of a total of 5 medically necessary complex treatment devices including one immobilization cradle and 5 MLC apertures.  I have requested : Isodose Plan.    PLAN:  The patient will receive 20 Gy in 10 fractions.  ________________________________  Sheral Apley Tammi Klippel, M.D.

## 2016-06-23 ENCOUNTER — Telehealth: Payer: Self-pay | Admitting: *Deleted

## 2016-06-23 DIAGNOSIS — Z510 Encounter for antineoplastic radiation therapy: Secondary | ICD-10-CM | POA: Diagnosis not present

## 2016-06-23 NOTE — Telephone Encounter (Signed)
Wife asks if ok for pt to try Pro Biotic for diarrhea?  Physical Therapist is at their home now and suggested it.  Pt continues to have loose stools x 3 today.  He has taken 4 imodium.  Informed wife ok w/ Dr. Alvy Bimler for pt to try Pro Biotic OTC as directed on package.  Continue imodium as directed up to 8 tablets per day and call if imodium does not help.  Pt has week off Velcade next week and wife hopes diarrhea will resolve.  She verbalized understanding to call if it gets worse.

## 2016-06-26 ENCOUNTER — Ambulatory Visit: Payer: PPO | Admitting: Radiation Oncology

## 2016-06-27 ENCOUNTER — Ambulatory Visit: Payer: PPO

## 2016-06-28 ENCOUNTER — Ambulatory Visit: Payer: PPO

## 2016-06-28 ENCOUNTER — Ambulatory Visit
Admission: RE | Admit: 2016-06-28 | Discharge: 2016-06-28 | Disposition: A | Discharge status: Home / Self Care | Payer: PPO | Source: Ambulatory Visit | Attending: Radiation Oncology | Admitting: Radiation Oncology

## 2016-06-28 DIAGNOSIS — Z510 Encounter for antineoplastic radiation therapy: Secondary | ICD-10-CM | POA: Diagnosis not present

## 2016-06-29 ENCOUNTER — Ambulatory Visit
Admission: RE | Admit: 2016-06-29 | Discharge: 2016-06-29 | Disposition: A | Discharge status: Home / Self Care | Payer: PPO | Source: Ambulatory Visit | Attending: Radiation Oncology | Admitting: Radiation Oncology

## 2016-06-29 ENCOUNTER — Encounter: Payer: Self-pay | Admitting: Radiation Oncology

## 2016-06-29 ENCOUNTER — Encounter: Payer: Self-pay | Admitting: Vascular Surgery

## 2016-06-29 ENCOUNTER — Ambulatory Visit: Payer: PPO

## 2016-06-29 VITALS — BP 120/60 | HR 98 | Temp 99.1°F | Resp 16 | Wt 159.0 lb

## 2016-06-29 DIAGNOSIS — Z510 Encounter for antineoplastic radiation therapy: Secondary | ICD-10-CM | POA: Diagnosis not present

## 2016-06-29 DIAGNOSIS — C9000 Multiple myeloma not having achieved remission: Secondary | ICD-10-CM

## 2016-06-29 MED ORDER — RADIAPLEXRX EX GEL
Freq: Once | CUTANEOUS | Status: AC
  Administered 2016-06-29: 17:00:00 via TOPICAL

## 2016-06-29 NOTE — Progress Notes (Signed)
Weight stable. Low grade fever noted. Reports post nasal drip and productive cough. Reports ambulating with aid of walker. Denies pain. Edema of left knee and left ankle noted. Wife reports they plan to ice these areas and elevated once home. Reports diarrhea managed with Imodium AD. Reports urinary frequency despite taking dialysis.   BP 120/60 mmHg  Pulse 98  Temp(Src) 99.1 F (37.3 C) (Oral)  Resp 16  Wt 159 lb (72.122 kg)  SpO2 100% Wt Readings from Last 3 Encounters:  06/29/16 159 lb (72.122 kg)  06/22/16 163 lb 3.2 oz (74.027 kg)  06/12/16 162 lb (73.483 kg)

## 2016-06-29 NOTE — Progress Notes (Signed)
  Radiation Oncology         [phone]   Name: Thomas [name] MRN: [id number]   Date: 06/29/2016  DOB: 1946-07-28   Weekly Radiation Therapy Management    ICD-9-CM ICD-10-CM   1. Multiple myeloma, remission status unspecified ([place]) 203.00 C90.00 hyaluronate sodium (RADIAPLEXRX) gel    Current Dose: 4 Gy  Planned Dose:  20 Gy  Narrative The patient presents for routine under treatment assessment.  Set-up films were reviewed. The chart was checked.  Physical Findings  weight is 159 lb (72.1 kg). His oral temperature is 99.1 F (37.3 C). His blood pressure is 120/60 and his pulse is 98. His respiration is 16 and oxygen saturation is 100%. . Weight essentially stable.  No significant changes.  Impression The patient is tolerating radiation.  Plan Continue treatment as planned.     Sheral Apley Tammi Klippel, M.D.  This document serves as a record of services personally performed by Tyler Pita, MD. It was created on his behalf by Darcus Austin, a trained medical scribe. The creation of this record is based on the scribe's personal observations and the provider's statements to them. This document has been checked and approved by the attending provider.

## 2016-06-30 ENCOUNTER — Ambulatory Visit: Payer: PPO

## 2016-06-30 ENCOUNTER — Ambulatory Visit
Admission: RE | Admit: 2016-06-30 | Discharge: 2016-06-30 | Disposition: A | Discharge status: Home / Self Care | Payer: PPO | Source: Ambulatory Visit | Attending: Radiation Oncology | Admitting: Radiation Oncology

## 2016-06-30 DIAGNOSIS — Z510 Encounter for antineoplastic radiation therapy: Secondary | ICD-10-CM | POA: Diagnosis not present

## 2016-07-03 ENCOUNTER — Telehealth: Payer: Self-pay | Admitting: *Deleted

## 2016-07-03 ENCOUNTER — Ambulatory Visit: Payer: PPO

## 2016-07-03 ENCOUNTER — Telehealth: Payer: Self-pay | Admitting: Hematology and Oncology

## 2016-07-03 ENCOUNTER — Ambulatory Visit (HOSPITAL_BASED_OUTPATIENT_CLINIC_OR_DEPARTMENT_OTHER): Payer: PPO

## 2016-07-03 ENCOUNTER — Ambulatory Visit
Admission: RE | Admit: 2016-07-03 | Discharge: 2016-07-03 | Disposition: A | Discharge status: Home / Self Care | Payer: PPO | Source: Ambulatory Visit | Attending: Radiation Oncology | Admitting: Radiation Oncology

## 2016-07-03 ENCOUNTER — Ambulatory Visit (HOSPITAL_BASED_OUTPATIENT_CLINIC_OR_DEPARTMENT_OTHER): Payer: PPO | Admitting: Hematology and Oncology

## 2016-07-03 ENCOUNTER — Encounter: Payer: Self-pay | Admitting: Hematology and Oncology

## 2016-07-03 ENCOUNTER — Other Ambulatory Visit: Payer: Self-pay | Admitting: Hematology and Oncology

## 2016-07-03 ENCOUNTER — Encounter: Payer: Self-pay | Admitting: Pharmacist

## 2016-07-03 ENCOUNTER — Other Ambulatory Visit (HOSPITAL_BASED_OUTPATIENT_CLINIC_OR_DEPARTMENT_OTHER): Payer: PPO

## 2016-07-03 VITALS — BP 127/53 | HR 94 | Temp 98.6°F | Resp 18 | Ht 73.0 in | Wt 162.1 lb

## 2016-07-03 DIAGNOSIS — C9000 Multiple myeloma not having achieved remission: Secondary | ICD-10-CM

## 2016-07-03 DIAGNOSIS — E440 Moderate protein-calorie malnutrition: Secondary | ICD-10-CM

## 2016-07-03 DIAGNOSIS — N185 Chronic kidney disease, stage 5: Secondary | ICD-10-CM | POA: Diagnosis not present

## 2016-07-03 DIAGNOSIS — H109 Unspecified conjunctivitis: Secondary | ICD-10-CM | POA: Insufficient documentation

## 2016-07-03 DIAGNOSIS — Z5112 Encounter for antineoplastic immunotherapy: Secondary | ICD-10-CM

## 2016-07-03 DIAGNOSIS — Z992 Dependence on renal dialysis: Secondary | ICD-10-CM

## 2016-07-03 DIAGNOSIS — J329 Chronic sinusitis, unspecified: Secondary | ICD-10-CM | POA: Insufficient documentation

## 2016-07-03 DIAGNOSIS — D631 Anemia in chronic kidney disease: Secondary | ICD-10-CM

## 2016-07-03 DIAGNOSIS — E538 Deficiency of other specified B group vitamins: Secondary | ICD-10-CM | POA: Diagnosis not present

## 2016-07-03 DIAGNOSIS — J018 Other acute sinusitis: Secondary | ICD-10-CM

## 2016-07-03 DIAGNOSIS — Z510 Encounter for antineoplastic radiation therapy: Secondary | ICD-10-CM | POA: Diagnosis not present

## 2016-07-03 DIAGNOSIS — N186 End stage renal disease: Secondary | ICD-10-CM

## 2016-07-03 DIAGNOSIS — R197 Diarrhea, unspecified: Secondary | ICD-10-CM

## 2016-07-03 LAB — CBC WITH DIFFERENTIAL/PLATELET
BASO%: 0.4 % (ref 0.0–2.0)
BASOS ABS: 0 10*3/uL (ref 0.0–0.1)
EOS ABS: 0 10*3/uL (ref 0.0–0.5)
EOS%: 0.1 % (ref 0.0–7.0)
HCT: 25 % — ABNORMAL LOW (ref 38.4–49.9)
HEMOGLOBIN: 8.3 g/dL — AB (ref 13.0–17.1)
LYMPH%: 5.9 % — ABNORMAL LOW (ref 14.0–49.0)
MCH: 30.6 pg (ref 27.2–33.4)
MCHC: 33.3 g/dL (ref 32.0–36.0)
MCV: 91.8 fL (ref 79.3–98.0)
MONO#: 0.8 10*3/uL (ref 0.1–0.9)
MONO%: 8.7 % (ref 0.0–14.0)
NEUT%: 84.9 % — ABNORMAL HIGH (ref 39.0–75.0)
NEUTROS ABS: 7.9 10*3/uL — AB (ref 1.5–6.5)
Platelets: 197 10*3/uL (ref 140–400)
RBC: 2.73 10*6/uL — ABNORMAL LOW (ref 4.20–5.82)
RDW: 16.4 % — AB (ref 11.0–14.6)
WBC: 9.3 10*3/uL (ref 4.0–10.3)
lymph#: 0.5 10*3/uL — ABNORMAL LOW (ref 0.9–3.3)

## 2016-07-03 LAB — COMPREHENSIVE METABOLIC PANEL
ALBUMIN: 2.4 g/dL — AB (ref 3.5–5.0)
ALK PHOS: 100 U/L (ref 40–150)
ALT: 9 U/L (ref 0–55)
AST: 11 U/L (ref 5–34)
Anion Gap: 9 mEq/L (ref 3–11)
BILIRUBIN TOTAL: 0.59 mg/dL (ref 0.20–1.20)
BUN: 34.8 mg/dL — AB (ref 7.0–26.0)
CALCIUM: 8.1 mg/dL — AB (ref 8.4–10.4)
CO2: 25 mEq/L (ref 22–29)
Chloride: 96 mEq/L — ABNORMAL LOW (ref 98–109)
Creatinine: 4.1 mg/dL (ref 0.7–1.3)
EGFR: 14 mL/min/{1.73_m2} — AB (ref >90)
GLUCOSE: 150 mg/dL — AB (ref 70–140)
POTASSIUM: 4.2 meq/L (ref 3.5–5.1)
Sodium: 130 mEq/L — ABNORMAL LOW (ref 136–145)
TOTAL PROTEIN: 7.1 g/dL (ref 6.4–8.3)

## 2016-07-03 MED ORDER — PROCHLORPERAZINE MALEATE 10 MG PO TABS
ORAL_TABLET | ORAL | Status: AC
  Filled 2016-07-03: qty 1

## 2016-07-03 MED ORDER — TRIAMCINOLONE ACETONIDE 55 MCG/ACT NA AERO: 2.0000 | INHALATION_SPRAY | Freq: Every day | NASAL | 1 refills | Status: DC

## 2016-07-03 MED ORDER — ACYCLOVIR 200 MG PO CAPS: 200.0000 mg | ORAL_CAPSULE | Freq: Two times a day (BID) | ORAL | 9 refills | Status: DC

## 2016-07-03 MED ORDER — PROCHLORPERAZINE MALEATE 10 MG PO TABS
10.0000 mg | ORAL_TABLET | Freq: Once | ORAL | Status: AC
  Administered 2016-07-03: 10 mg via ORAL

## 2016-07-03 MED ORDER — ONDANSETRON HCL 8 MG PO TABS: 8.0000 mg | ORAL_TABLET | Freq: Three times a day (TID) | ORAL | 3 refills | Status: AC | PRN

## 2016-07-03 MED ORDER — DEXAMETHASONE 4 MG PO TABS: 20.0000 mg | ORAL_TABLET | ORAL | 1 refills | Status: DC

## 2016-07-03 MED ORDER — BORTEZOMIB CHEMO SQ INJECTION 3.5 MG (2.5MG/ML)
1.3000 mg/m2 | Freq: Once | INTRAMUSCULAR | Status: AC
  Administered 2016-07-03: 2.5 mg via SUBCUTANEOUS
  Filled 2016-07-03: qty 2.5

## 2016-07-03 MED ORDER — TOBRAMYCIN 0.3 % OP SOLN: 1.0000 [drp] | OPHTHALMIC | 0 refills | Status: DC

## 2016-07-03 NOTE — Patient Instructions (Signed)

## 2016-07-03 NOTE — Telephone Encounter (Signed)
I sent electronic order for zofran prn

## 2016-07-03 NOTE — Telephone Encounter (Signed)
Gave pt cal & avs °

## 2016-07-03 NOTE — Telephone Encounter (Signed)
Infusion RN reports pt does not have any nausea medication at home and wife is asking for nausea medication to be prescribed in case he needs it with taking Revlimid.

## 2016-07-03 NOTE — Progress Notes (Signed)
Oral Chemotherapy Pharmacist Encounter  I spoke with patient today during his infusion appt and provided an overview of new oral chemotherapy medication: Revlimid. Pt is to start medication today and his wife reported they have the medication at home.   Medication education was provided to patient and his wife on administration, dosing, side effects, safe handling, and monitoring. Side effects include but not limited to: edema, rash, itching, N/V.  They both voiced understanding and appreciation.   All questions answered.  Will follow up with patient in 1-2 weeks.   Thank you,  Nuala Alpha, PharmD Oral Chemotherapy Clinic

## 2016-07-03 NOTE — Telephone Encounter (Signed)
Informed wife of Rx zofran for nausea sent to their pharmacy.  She says Pharmacist told her for pt to take Revlimid on empty stomach one hour before or two hours after eatins.  She says Dr. Alvy Bimler told pt to take with food.  Instructed her ok for pt to take with or without food.  Dr. Alvy Bimler thinks it is easier on his stomach to take with food.  Wife says pt will have easier time to remember to take it at supper with his other pills.  Assured her ok w/ Dr. Alvy Bimler for pt to take Revlimid with food at supper.   She verbalized understanding.

## 2016-07-03 NOTE — Progress Notes (Signed)
[facility] OFFICE PROGRESS NOTE  Patient Care Team: No Pcp Per Patient as PCP - General (General Practice)  SUMMARY OF ONCOLOGIC HISTORY:   Multiple myeloma not having achieved remission ([place])   05/09/2016 - 06/07/2016 Hospital Admission    The patient was hospitalized for pancytopenia, acute renal failure and back pain. He was diagnosed with multiple myeloma requiring multiple surgery and hemodialysis. He was started on chemotherapy while hospitalized     05/11/2016 Pathology Results    Accession: [id number] kidney biopsy confimed myeloma involvement     05/12/2016 Imaging    Skeletal survey showed multiple bone lesions are suggestive for metastatic bone disease and suspicious for multiple myeloma. There is imminent pathological fracture of the left femur     05/14/2016 Pathology Results    Accession: [id number] biopsy from left femur confirmed myeloma     05/16/2016 Pathology Results    Bone marrow complex cytogenetics. Please see report     05/16/2016 Imaging    MRI spine: large 6.0 x 9.0 x 7.5 cm lesion with associated extraosseous extension of tumor into the left paraspinous soft tissues at L5 with moderate to severe canal stenosis. There is L3 compression fracture     05/16/2016 Bone Marrow Biopsy    Accession: [id number] bone marrow biopsy confirmed 60% plasma cell involvement     05/18/2016 -  Chemotherapy    He was started on Velcade and dexamethasone      INTERVAL HISTORY: Please see below for problem oriented charting. He is prior to cycle 3 of chemo He has started to receive XRT He had recent diarrhea He denies neuropathy Appetite is stable He complained of nasal drainage and new crusts on his eye lids recently  REVIEW OF SYSTEMS:   Constitutional: Denies fevers, chills or abnormal weight loss Eyes: Denies blurriness of vision Ears, nose, mouth, throat, and face: Denies mucositis or sore throat Respiratory: Denies cough, dyspnea or wheezes Cardiovascular:  Denies palpitation, chest discomfort or lower extremity swelling Skin: Denies abnormal skin rashes Lymphatics: Denies new lymphadenopathy or easy bruising Neurological:Denies numbness, tingling or new weaknesses Behavioral/Psych: Mood is stable, no new changes  All other systems were reviewed with the patient and are negative.  I have reviewed the past medical history, past surgical history, social history and family history with the patient and they are unchanged from previous note.  ALLERGIES:  is allergic to penicillins; dilaudid [hydromorphone hcl]; and codeine.  MEDICATIONS:  Current Outpatient Prescriptions  Medication Sig Dispense Refill  . acetaminophen (TYLENOL) 325 MG tablet Take 2 tablets (650 mg total) by mouth every 6 (six) hours as needed for mild pain (or Fever >/= 101). 15 tablet 0  . acyclovir (ZOVIRAX) 200 MG capsule Take 1 capsule (200 mg total) by mouth 2 (two) times daily. 60 capsule 9  . ALPRAZolam (XANAX) 0.25 MG tablet Take 1 tablet (0.25 mg total) by mouth 2 (two) times daily as needed for anxiety. 30 tablet 0  . calcium acetate (PHOSLO) 667 MG capsule Take 1 capsule (667 mg total) by mouth 2 (two) times daily with a meal. 60 capsule 1  . cyanocobalamin 1000 MCG tablet Take 1 tablet (1,000 mcg total) by mouth daily. 30 tablet 0  . dexamethasone (DECADRON) 4 MG tablet Take 5 tablets (20 mg total) by mouth once a week. 60 tablet 1  . lenalidomide (REVLIMID) 2.5 MG capsule Take 1 capsule daily for 14 days, then off 7 days 14 capsule 11  . loperamide (IMODIUM A-D) 2 MG  tablet Take 2 mg by mouth 4 (four) times daily as needed for diarrhea or loose stools.    Marland Kitchen loratadine (CLARITIN) 10 MG tablet Take 10 mg by mouth daily.    . multivitamin (RENA-VIT) TABS tablet Take 1 tablet by mouth at bedtime. 30 tablet 0  . pantoprazole (PROTONIX) 40 MG tablet Take 1 tablet (40 mg total) by mouth daily. 30 tablet 1  . Vitamin D, Ergocalciferol, (DRISDOL) 50000 units CAPS capsule Take 1  capsule (50,000 Units total) by mouth every 7 (seven) days. 60 capsule 0  . ondansetron (ZOFRAN) 8 MG tablet Take 1 tablet (8 mg total) by mouth every 8 (eight) hours as needed for nausea. 30 tablet 3  . tobramycin (TOBREX) 0.3 % ophthalmic solution Place 1 drop into both eyes every 4 (four) hours. 5 mL 0  . triamcinolone (NASACORT AQ) 55 MCG/ACT AERO nasal inhaler Place 2 sprays into the nose daily. 1 Inhaler 1   No current facility-administered medications for this visit.     PHYSICAL EXAMINATION: ECOG PERFORMANCE STATUS: 1 - Symptomatic but completely ambulatory  Vitals:   07/03/16 1049  BP: (!) 127/53  Pulse: 94  Resp: 18  Temp: 98.6 F (37 C)   Filed Weights   07/03/16 1049  Weight: 162 lb 1.6 oz (73.5 kg)    GENERAL:alert, no distress and comfortable SKIN: skin color, texture, turgor are normal, no rashes or significant lesions EYES: Noted mild conjuntivitis OROPHARYNX:no exudate, no erythema and lips, buccal mucosa, and tongue normal  NECK: supple, thyroid normal size, non-tender, without nodularity LYMPH:  no palpable lymphadenopathy in the cervical, axillary or inguinal LUNGS: clear to auscultation and percussion with normal breathing effort HEART: regular rate & rhythm and no murmurs and no lower extremity edema ABDOMEN:abdomen soft, non-tender and normal bowel sounds Musculoskeletal:no cyanosis of digits and no clubbing  NEURO: alert & oriented x 3 with fluent speech, no focal motor/sensory deficits  LABORATORY DATA:  I have reviewed the data as listed    Component Value Date/Time   NA 130 (L) 07/03/2016 1034   K 4.2 07/03/2016 1034   CL 97 (L) 06/08/2016 0700   CO2 25 07/03/2016 1034   GLUCOSE 150 (H) 07/03/2016 1034   BUN 34.8 (H) 07/03/2016 1034   CREATININE 4.1 (HH) 07/03/2016 1034   CALCIUM 8.1 (L) 07/03/2016 1034   PROT 7.1 07/03/2016 1034   ALBUMIN 2.4 (L) 07/03/2016 1034   AST 11 07/03/2016 1034   ALT 9 07/03/2016 1034   ALKPHOS 100 07/03/2016  1034   BILITOT 0.59 07/03/2016 1034   GFRNONAA 14 (L) 06/08/2016 0700   GFRAA 16 (L) 06/08/2016 0700    No results found for: SPEP, UPEP  Lab Results  Component Value Date   WBC 9.3 07/03/2016   NEUTROABS 7.9 (H) 07/03/2016   HGB 8.3 (L) 07/03/2016   HCT 25.0 (L) 07/03/2016   MCV 91.8 07/03/2016   PLT 197 07/03/2016      Chemistry      Component Value Date/Time   NA 130 (L) 07/03/2016 1034   K 4.2 07/03/2016 1034   CL 97 (L) 06/08/2016 0700   CO2 25 07/03/2016 1034   BUN 34.8 (H) 07/03/2016 1034   CREATININE 4.1 (HH) 07/03/2016 1034      Component Value Date/Time   CALCIUM 8.1 (L) 07/03/2016 1034   ALKPHOS 100 07/03/2016 1034   AST 11 07/03/2016 1034   ALT 9 07/03/2016 1034   BILITOT 0.59 07/03/2016 1034  ASSESSMENT & PLAN:   Multiple myeloma not having achieved remission ([place]) Bone marrow FISH which showed complex cytogenetics. Starting 07/03/16, I recommend addition of low dose Revlimid. He felt that his kidney function is improving. If he is able to get off dialysis, he might be a candidate for possible autologous stem cell transplant in the future. He will continue pulse dexamethasone every week but at reduced dose of 20 mg weekly and Velcade on days 1, 4, 7 and 11. He will take Revlimid on days 1-14, rest 7 days. He will take vitamin D, calcium, aspirin for DVT prophylaxis and acyclovir for antiviral prophylaxis. Due to renal failure, I will avoid IV bisphosphonates. He has started radiation therapy to the lesion on his spine and the left femur. I will see him prior to cycle 4 of therapy  Vitamin B12 deficiency He was started on vitamin B-12 injection in the hospital. Recent recheck vitamin B-12 level was adequate He will continue high-dose vitamin B-12 supplement by mouth I plan to recheck in 3 months, next test due around October 2017  ESRD (end stage renal disease) on dialysis [facility]) He is receiving hemodialysis in the outpatient on Tuesdays,  Thursdays and Saturdays. We will adjust his treatment based on his dialysis schedule for now  Protein-calorie malnutrition, moderate ([place]) He is eating better and is gaining weight. I continue to encourage him to increase oral intake as tolerated  Anemia due to stage 5 chronic kidney disease ([place]) This is likely anemia of chronic disease. The patient denies recent history of bleeding such as epistaxis, hematuria or hematochezia. He is asymptomatic from the anemia. We will observe for now.  He does not require transfusion now. He is receiving ESA through dialysis center   Diarrhea He has frequent bowel movement I recommend imodium as needed I do not think it is related to Velcade  Sinusitis nasal He has frequent nasal drainage likely viral in nature I recommend nasacort  Conjunctivitis of both eyes He appears to have bacterial conjunctivitis. I recommend topical eye drops  Orders Placed This Encounter  Procedures  . Kappa/lambda light chains    Standing Status:   Future    Standing Expiration Date:   08/08/2017     All questions were answered. The patient knows to call the clinic with any problems, questions or concerns. No barriers to learning was detected. I spent 30 minutes counseling the patient face to face. The total time spent in the appointment was 50 minutes and more than 50% was on counseling and review of test results     [facility], [place], MD 07/03/2016 4:02 PM

## 2016-07-04 ENCOUNTER — Ambulatory Visit: Payer: PPO

## 2016-07-04 ENCOUNTER — Ambulatory Visit
Admission: RE | Admit: 2016-07-04 | Discharge: 2016-07-04 | Disposition: A | Discharge status: Home / Self Care | Payer: PPO | Source: Ambulatory Visit | Attending: Radiation Oncology | Admitting: Radiation Oncology

## 2016-07-04 ENCOUNTER — Telehealth: Payer: Self-pay | Admitting: *Deleted

## 2016-07-04 DIAGNOSIS — Z510 Encounter for antineoplastic radiation therapy: Secondary | ICD-10-CM | POA: Diagnosis not present

## 2016-07-04 DIAGNOSIS — D631 Anemia in chronic kidney disease: Secondary | ICD-10-CM | POA: Insufficient documentation

## 2016-07-04 DIAGNOSIS — N189 Chronic kidney disease, unspecified: Secondary | ICD-10-CM

## 2016-07-04 DIAGNOSIS — R197 Diarrhea, unspecified: Secondary | ICD-10-CM | POA: Insufficient documentation

## 2016-07-04 NOTE — Assessment & Plan Note (Signed)
He appears to have bacterial conjunctivitis. I recommend topical eye drops

## 2016-07-04 NOTE — Assessment & Plan Note (Signed)
Bone marrow FISH which showed complex cytogenetics. Starting 07/03/16, I recommend addition of low dose Revlimid. He felt that his kidney function is improving. If he is able to get off dialysis, he might be a candidate for possible autologous stem cell transplant in the future. He will continue pulse dexamethasone every week but at reduced dose of 20 mg weekly and Velcade on days 1, 4, 7 and 11. He will take Revlimid on days 1-14, rest 7 days. He will take vitamin D, calcium, aspirin for DVT prophylaxis and acyclovir for antiviral prophylaxis. Due to renal failure, I will avoid IV bisphosphonates. He has started radiation therapy to the lesion on his spine and the left femur. I will see him prior to cycle 4 of therapy

## 2016-07-04 NOTE — Assessment & Plan Note (Signed)
He has frequent bowel movement I recommend imodium as needed I do not think it is related to Velcade

## 2016-07-04 NOTE — Assessment & Plan Note (Signed)
He is eating better and is gaining weight. I continue to encourage him to increase oral intake as tolerated

## 2016-07-04 NOTE — Assessment & Plan Note (Signed)
He has frequent nasal drainage likely viral in nature I recommend nasacort

## 2016-07-04 NOTE — Telephone Encounter (Signed)
Call from Dialysis Nurse states wife thinks Dr. Alvy Bimler says pt needs to be on Aranesp And Procrit.  Clarified that Dr. Alvy Bimler wants pt to be on Aranesp OR Procrit, not both.  Pt is on Aranesp once weekly at 40 mcg and they will titrate depending on his labs.  HD Nurse will let wife know.

## 2016-07-04 NOTE — Assessment & Plan Note (Signed)
This is likely anemia of chronic disease. The patient denies recent history of bleeding such as epistaxis, hematuria or hematochezia. He is asymptomatic from the anemia. We will observe for now.  He does not require transfusion now. He is receiving ESA through dialysis center

## 2016-07-04 NOTE — Assessment & Plan Note (Signed)
He was started on vitamin B-12 injection in the hospital. Recent recheck vitamin B-12 level was adequate He will continue high-dose vitamin B-12 supplement by mouth I plan to recheck in 3 months, next test due around October 2017

## 2016-07-04 NOTE — Assessment & Plan Note (Signed)
He is receiving hemodialysis in the outpatient on Tuesdays, Thursdays and Saturdays. We will adjust his treatment based on his dialysis schedule for now

## 2016-07-05 ENCOUNTER — Ambulatory Visit
Admission: RE | Admit: 2016-07-05 | Discharge: 2016-07-05 | Disposition: A | Discharge status: Home / Self Care | Payer: PPO | Source: Ambulatory Visit | Attending: Radiation Oncology | Admitting: Radiation Oncology

## 2016-07-05 ENCOUNTER — Ambulatory Visit: Payer: PPO

## 2016-07-05 DIAGNOSIS — Z510 Encounter for antineoplastic radiation therapy: Secondary | ICD-10-CM | POA: Diagnosis not present

## 2016-07-06 ENCOUNTER — Ambulatory Visit (HOSPITAL_BASED_OUTPATIENT_CLINIC_OR_DEPARTMENT_OTHER): Payer: PPO

## 2016-07-06 ENCOUNTER — Ambulatory Visit: Payer: PPO

## 2016-07-06 ENCOUNTER — Ambulatory Visit
Admission: RE | Admit: 2016-07-06 | Discharge: 2016-07-06 | Disposition: A | Discharge status: Home / Self Care | Payer: PPO | Source: Ambulatory Visit | Attending: Radiation Oncology | Admitting: Radiation Oncology

## 2016-07-06 VITALS — BP 103/48 | HR 94 | Temp 98.8°F | Resp 18

## 2016-07-06 DIAGNOSIS — Z5112 Encounter for antineoplastic immunotherapy: Secondary | ICD-10-CM | POA: Diagnosis not present

## 2016-07-06 DIAGNOSIS — C9000 Multiple myeloma not having achieved remission: Secondary | ICD-10-CM | POA: Diagnosis not present

## 2016-07-06 DIAGNOSIS — Z510 Encounter for antineoplastic radiation therapy: Secondary | ICD-10-CM | POA: Diagnosis not present

## 2016-07-06 MED ORDER — BORTEZOMIB CHEMO SQ INJECTION 3.5 MG (2.5MG/ML)
1.3000 mg/m2 | Freq: Once | INTRAMUSCULAR | Status: AC
  Administered 2016-07-06: 2.5 mg via SUBCUTANEOUS
  Filled 2016-07-06: qty 2.5

## 2016-07-06 MED ORDER — PROCHLORPERAZINE MALEATE 10 MG PO TABS
10.0000 mg | ORAL_TABLET | Freq: Once | ORAL | Status: AC
  Administered 2016-07-06: 10 mg via ORAL

## 2016-07-06 MED ORDER — PROCHLORPERAZINE MALEATE 10 MG PO TABS
ORAL_TABLET | ORAL | Status: AC
  Filled 2016-07-06: qty 1

## 2016-07-06 NOTE — Progress Notes (Signed)
This encounter did not occur on the scheduled date. This encounter was created in error - please disregard. 

## 2016-07-06 NOTE — Patient Instructions (Signed)
[facility] Discharge Instructions for Patients Receiving Chemotherapy  Today you received the following chemotherapy agents Velcade. To help prevent nausea and vomiting after your treatment, we encourage you to take your nausea medication as directed.  If you develop nausea and vomiting that is not controlled by your nausea medication, call the clinic.   BELOW ARE SYMPTOMS THAT SHOULD BE REPORTED IMMEDIATELY:  *FEVER GREATER THAN 100.5 F  *CHILLS WITH OR WITHOUT FEVER  NAUSEA AND VOMITING THAT IS NOT CONTROLLED WITH YOUR NAUSEA MEDICATION  *UNUSUAL SHORTNESS OF BREATH  *UNUSUAL BRUISING OR BLEEDING  TENDERNESS IN MOUTH AND THROAT WITH OR WITHOUT PRESENCE OF ULCERS  *URINARY PROBLEMS  *BOWEL PROBLEMS  UNUSUAL RASH Items with * indicate a potential emergency and should be followed up as soon as possible.  Feel free to call the clinic you have any questions or concerns. The clinic phone number is [phone].  Please show the CHEMO ALERT CARD at check-in to the Emergency Department and triage nurse.    

## 2016-07-07 ENCOUNTER — Encounter: Payer: Self-pay | Admitting: Pharmacist

## 2016-07-07 ENCOUNTER — Ambulatory Visit
Admission: RE | Admit: 2016-07-07 | Discharge: 2016-07-07 | Disposition: A | Discharge status: Home / Self Care | Payer: PPO | Source: Ambulatory Visit | Attending: Radiation Oncology | Admitting: Radiation Oncology

## 2016-07-07 ENCOUNTER — Telehealth: Payer: Self-pay | Admitting: *Deleted

## 2016-07-07 ENCOUNTER — Encounter: Payer: PPO | Admitting: Vascular Surgery

## 2016-07-07 ENCOUNTER — Ambulatory Visit: Payer: PPO

## 2016-07-07 VITALS — BP 127/55 | HR 97 | Resp 16 | Wt 163.9 lb

## 2016-07-07 DIAGNOSIS — Z510 Encounter for antineoplastic radiation therapy: Secondary | ICD-10-CM | POA: Diagnosis not present

## 2016-07-07 DIAGNOSIS — C9000 Multiple myeloma not having achieved remission: Secondary | ICD-10-CM

## 2016-07-07 MED ORDER — RADIAPLEXRX EX GEL
Freq: Once | CUTANEOUS | Status: AC
  Administered 2016-07-07: 17:00:00 via TOPICAL

## 2016-07-07 MED ORDER — DIPHENOXYLATE-ATROPINE 2.5-0.025 MG PO TABS: 1.0000 | ORAL_TABLET | Freq: Four times a day (QID) | ORAL | 0 refills | Status: DC | PRN

## 2016-07-07 NOTE — Progress Notes (Signed)
Oral Chemotherapy Pharmacist Encounter  Received fax from [facility] with refill request for Revlimid 2.5mg  capsules. Pt started cycle on 7/24. Next cycle will start 8/14, this is also next appt with MD.  D/w Dr. Calton Dach desk RN that request should not be completed yet. Pt to have kappa light chains repeated on 7/31. Refill request to be completed week of 8/7. Oral Chemo Clinic will f/u with desk RN after labs 7/31 to discuss when to complete refill request.  Oral Chemo Clinic will follow-up with patient at infusion appt on 8/3 for toxicity and compliance management.  Johny Drilling, PharmD, BCPS Oral Chemotherapy Clinic

## 2016-07-07 NOTE — Progress Notes (Signed)
Weight and vitals stable. Reports post nasal drip and productive cough have improved with nasal spray. Reports ambulating at home with a walker. Denies pain. Denies taking any pain medication. Edema of left knee and left ankle continue. Reports since 0135 the patient has had 13 bowel movements of diarrhea. Reports taking five imodium ad tablets. Reports dr. Alvy Bimler called in Lomotil and they plan to pick this up after PUT visit today. Denies skin changes within treatment field. Reports using radiaplex bid as directed. Denies rectal irritation.   BP (!) 127/55   Pulse 97   Resp 16   Wt 163 lb 14.4 oz (74.3 kg)   SpO2 100%   BMI 21.62 kg/m  Wt Readings from Last 3 Encounters:  07/07/16 163 lb 14.4 oz (74.3 kg)  07/03/16 162 lb 1.6 oz (73.5 kg)  06/29/16 159 lb (72.1 kg)

## 2016-07-07 NOTE — Progress Notes (Signed)
  Radiation Oncology         [phone]   Name: Thomas [name] MRN: [id number]   Date: 07/07/2016  DOB: 05/08/1946   Weekly Radiation Therapy Management    ICD-9-CM ICD-10-CM   1. Multiple myeloma, remission status unspecified ([place]) 203.00 C90.00     Current Dose: 16 Gy  Planned Dose:  20 Gy  Narrative The patient presents for routine under treatment assessment. Reports post nasal drip and productive cough have improved with nasal spray. Reports ambulating at home with a walker. Denies pain. Denies taking any pain medication. Edema of left knee and left ankle continue. Reports since 0135 the patient has had 13 bowel movements of diarrhea. Reports taking five imodium ad tablets. Reports dr. Alvy Bimler called in Lomotil and they plan to pick this up after PUT visit today. Denies skin changes within treatment field. Reports using radiaplex bid as directed. Denies rectal irritation.  The patient reports that icing his legs and ankles seem to help alleviate associated symptoms.  The patient indicates that he will check in with nephrology.  Set-up films were reviewed. The chart was checked.  Physical Findings  weight is 163 lb 14.4 oz (74.3 kg). His blood pressure is 127/55 (abnormal) and his pulse is 97. His respiration is 16 and oxygen saturation is 100%. . Weight essentially stable.  No significant changes.   Impression The patient is tolerating radiation.  Plan Continue treatment as planned. The patient finishes treatment Tuesday and I will seem him again in one month.      Sheral Apley Tammi Klippel, M.D.  This document serves as a record of services personally performed by Shona Simpson, PA-C and Tyler Pita, MD. It was created on their behalf by Truddie Hidden, a trained medical scribe. The creation of this record is based on the scribe's personal observations and the providers' statements to them. This document has been checked and approved by the attending provider.

## 2016-07-07 NOTE — Telephone Encounter (Signed)
Wife reported pt had diarrhea stools 6 times last night and 12 times this morning.  He took 4 imodium last night and 5 pills this morning.  He only has small amt stool each time but it is loose and frequent.   Imodium does not seem to be helping.  Notified Dr. Alvy Bimler and she ordered lomotil.  Called Lomotil in to pt's pharmacy.  Notified wife and let us know if lomotil does not help.  She verbalized understanding.

## 2016-07-07 NOTE — Addendum Note (Signed)
Encounter addended by: Heywood Footman, RN on: 07/07/2016  4:40 PM<BR>    Actions taken: Visit diagnoses modified, Order Entry activity accessed, Diagnosis association updated, MAR administration accepted

## 2016-07-10 ENCOUNTER — Telehealth: Payer: Self-pay | Admitting: Hematology and Oncology

## 2016-07-10 ENCOUNTER — Ambulatory Visit: Payer: PPO

## 2016-07-10 ENCOUNTER — Ambulatory Visit
Admission: RE | Admit: 2016-07-10 | Discharge: 2016-07-10 | Disposition: A | Discharge status: Home / Self Care | Payer: PPO | Source: Ambulatory Visit | Attending: Radiation Oncology | Admitting: Radiation Oncology

## 2016-07-10 ENCOUNTER — Other Ambulatory Visit (HOSPITAL_BASED_OUTPATIENT_CLINIC_OR_DEPARTMENT_OTHER): Payer: PPO

## 2016-07-10 ENCOUNTER — Telehealth: Payer: Self-pay | Admitting: *Deleted

## 2016-07-10 DIAGNOSIS — C9000 Multiple myeloma not having achieved remission: Secondary | ICD-10-CM | POA: Diagnosis not present

## 2016-07-10 DIAGNOSIS — Z510 Encounter for antineoplastic radiation therapy: Secondary | ICD-10-CM | POA: Diagnosis not present

## 2016-07-10 LAB — CBC WITH DIFFERENTIAL/PLATELET
BASO%: 0.2 % (ref 0.0–2.0)
Basophils Absolute: 0 10*3/uL (ref 0.0–0.1)
EOS ABS: 0.1 10*3/uL (ref 0.0–0.5)
EOS%: 1.1 % (ref 0.0–7.0)
HEMATOCRIT: 26.2 % — AB (ref 38.4–49.9)
HEMOGLOBIN: 8.9 g/dL — AB (ref 13.0–17.1)
LYMPH#: 0.5 10*3/uL — AB (ref 0.9–3.3)
LYMPH%: 7.3 % — ABNORMAL LOW (ref 14.0–49.0)
MCH: 30.5 pg (ref 27.2–33.4)
MCHC: 34 g/dL (ref 32.0–36.0)
MCV: 89.7 fL (ref 79.3–98.0)
MONO#: 0.3 10*3/uL (ref 0.1–0.9)
MONO%: 5.1 % (ref 0.0–14.0)
NEUT%: 86.3 % — AB (ref 39.0–75.0)
NEUTROS ABS: 5.4 10*3/uL (ref 1.5–6.5)
PLATELETS: 38 10*3/uL — AB (ref 140–400)
RBC: 2.92 10*6/uL — ABNORMAL LOW (ref 4.20–5.82)
RDW: 15.1 % — ABNORMAL HIGH (ref 11.0–14.6)
WBC: 6.3 10*3/uL (ref 4.0–10.3)
nRBC: 0 % (ref 0–0)

## 2016-07-10 LAB — COMPREHENSIVE METABOLIC PANEL
ALBUMIN: 2.5 g/dL — AB (ref 3.5–5.0)
ALK PHOS: 102 U/L (ref 40–150)
ALT: 36 U/L (ref 0–55)
ANION GAP: 9 meq/L (ref 3–11)
AST: 34 U/L (ref 5–34)
BILIRUBIN TOTAL: 0.52 mg/dL (ref 0.20–1.20)
BUN: 15 mg/dL (ref 7.0–26.0)
CALCIUM: 8.5 mg/dL (ref 8.4–10.4)
CHLORIDE: 98 meq/L (ref 98–109)
CO2: 27 mEq/L (ref 22–29)
CREATININE: 3.4 mg/dL — AB (ref 0.7–1.3)
EGFR: 18 mL/min/{1.73_m2} — ABNORMAL LOW (ref >90)
Glucose: 125 mg/dl (ref 70–140)
Potassium: 3.6 mEq/L (ref 3.5–5.1)
Sodium: 134 mEq/L — ABNORMAL LOW (ref 136–145)
Total Protein: 6.6 g/dL (ref 6.4–8.3)

## 2016-07-10 LAB — HEPATIC FUNCTION PANEL
ALT: 36 U/L (ref 10–40)
AST: 34 U/L (ref 14–40)
Alkaline Phosphatase: 102 U/L (ref 25–125)
BILIRUBIN, TOTAL: 0.5 mg/dL

## 2016-07-10 LAB — BASIC METABOLIC PANEL
BUN: 15 mg/dL (ref 4–21)
Creatinine: 3.4 mg/dL — AB (ref 0.6–1.3)
GLUCOSE: 125 mg/dL
Potassium: 3.6 mmol/L (ref 3.4–5.3)
Sodium: 134 mmol/L — AB (ref 137–147)

## 2016-07-10 LAB — CBC AND DIFFERENTIAL
HEMATOCRIT: 26 % — AB (ref 41–53)
Hemoglobin: 8.9 g/dL — AB (ref 13.5–17.5)
PLATELETS: 38 10*3/uL — AB (ref 150–399)
WBC: 6.3 10^3/mL

## 2016-07-10 NOTE — Progress Notes (Signed)
Platelets low. Dr. Alvy Bimler aware. Advised to have patient hold chemo this week. Also, hold Revlimid until appointment with Dr. Alvy Bimler on 07/24/2016. Patient informed and questions answered.

## 2016-07-10 NOTE — Patient Instructions (Addendum)
Thrombocytopenia Thrombocytopenia is a condition in which there is an abnormally small number of platelets in your blood. Platelets are also called thrombocytes. Platelets are needed for blood clotting. CAUSES Thrombocytopenia is caused by:   Decreased production of platelets. This can be caused by:  Aplastic anemia in which your bone marrow quits making blood cells.  Cancer in the bone marrow.  Use of certain medicines, including chemotherapy.  Infection in the bone marrow.  Heavy alcohol consumption.  Increased destruction of platelets. This can be caused by:  Certain immune diseases.  Use of certain drugs.  Certain blood clotting disorders.  Certain inherited disorders.  Certain bleeding disorders.  Pregnancy.  Having an enlarged spleen (hypersplenism). In hypersplenism, the spleen gathers up platelets from circulation. This means the platelets are not available to help with blood clotting. The spleen can enlarge due to cirrhosis or other conditions. SYMPTOMS  The symptoms of thrombocytopenia are side effects of poor blood clotting. Some of these are:  Abnormal bleeding.  Nosebleeds.  Heavy menstrual periods.  Blood in the urine or stools.  Purpura. This is a purplish discoloration in the skin produced by small bleeding vessels near the surface of the skin.  Bruising.  A rash that may be petechial. This looks like pinpoint, purplish-red spots on the skin and mucous membranes. It is caused by bleeding from small blood vessels (capillaries). DIAGNOSIS  Your caregiver will make this diagnosis based on your exam and blood tests. Sometimes, a bone marrow study is done to look for the original cells (megakaryocytes) that make platelets. TREATMENT  Treatment depends on the cause of the condition.  Medicines may be given to help protect your platelets from being destroyed.  In some cases, a replacement (transfusion) of platelets may be required to stop or prevent  bleeding.  Sometimes, the spleen must be surgically removed. HOME CARE INSTRUCTIONS   Check the skin and linings inside your mouth for bruising or bleeding as directed by your caregiver.  Check your sputum, urine, and stool for blood as directed by your caregiver.  Do not return to any activities that could cause bumps or bruises until your caregiver says it is okay.  Take extra care not to cut yourself when shaving or when using scissors, needles, knives, and other tools.  Take extra care not to burn yourself when ironing or cooking.  Ask your caregiver if it is okay for you to drink alcohol.  Only take over-the-counter or prescription medicines as directed by your caregiver.  Notify all your caregivers, including dentists and eye doctors, about your condition. SEEK IMMEDIATE MEDICAL CARE IF:   You develop active bleeding from anywhere in your body.  You develop unexplained bruising or bleeding.  You have blood in your sputum, urine, or stool. MAKE SURE YOU:  Understand these instructions.  Will watch your condition.  Will get help right away if you are not doing well or get worse.   This information is not intended to replace advice given to you by your health care provider. Make sure you discuss any questions you have with your health care provider.   Document Released: 11/27/2005 Document Revised: 02/19/2012 Document Reviewed: 05/31/2015 Elsevier Interactive Patient Education 2016 [place] until you see Dr. Alvy Bimler on 07/24/2016.  Continue antidiarrheals as previously instructed.  No chemo injections this week this week.  Call Dr. Calton Dach office if you experience any changes in your condition.  Report to the Emergency Department if you develop any emergencies.

## 2016-07-10 NOTE — Telephone Encounter (Signed)
I reviewed his CBC today. The patient has significant pancytopenia. I will cancel his treatment this week and recommend holding off Revlimid until further notice

## 2016-07-10 NOTE — Telephone Encounter (Signed)
Wife called to see if he needs to stop taking ASA.  Dr Alvy Bimler says, yes to stop ASA.

## 2016-07-11 ENCOUNTER — Encounter: Payer: Self-pay | Admitting: Radiation Oncology

## 2016-07-11 ENCOUNTER — Ambulatory Visit: Payer: PPO

## 2016-07-11 ENCOUNTER — Encounter: Payer: Self-pay | Admitting: *Deleted

## 2016-07-11 ENCOUNTER — Ambulatory Visit
Admission: RE | Admit: 2016-07-11 | Discharge: 2016-07-11 | Disposition: A | Discharge status: Home / Self Care | Payer: PPO | Source: Ambulatory Visit | Attending: Radiation Oncology | Admitting: Radiation Oncology

## 2016-07-11 DIAGNOSIS — D696 Thrombocytopenia, unspecified: Secondary | ICD-10-CM

## 2016-07-11 DIAGNOSIS — R7989 Other specified abnormal findings of blood chemistry: Secondary | ICD-10-CM

## 2016-07-11 DIAGNOSIS — Z510 Encounter for antineoplastic radiation therapy: Secondary | ICD-10-CM | POA: Diagnosis not present

## 2016-07-11 HISTORY — PX: SP CHOLECYSTOMY: HXRAD409

## 2016-07-11 HISTORY — DX: Other specified abnormal findings of blood chemistry: R79.89

## 2016-07-11 HISTORY — DX: Thrombocytopenia, unspecified: D69.6

## 2016-07-11 LAB — KAPPA/LAMBDA LIGHT CHAINS
IG KAPPA FREE LIGHT CHAIN: 100 mg/L — AB (ref 3.3–19.4)
IG LAMBDA FREE LIGHT CHAIN: 15.7 mg/L (ref 5.7–26.3)
Kappa/Lambda FluidC Ratio: 6.37 — ABNORMAL HIGH (ref 0.26–1.65)

## 2016-07-11 NOTE — Progress Notes (Signed)
Miranda,RT therapist called asking if patient can be treated as his chemotherapy was cancelled due to low platelets, showed Dr. Sondra Come patient's labs wbc=6.3, plt=38, we are treating patient's pelvis, pkay to treat per Dr. Sondra Come,  Miranda informed to treat patient 10:38 AM

## 2016-07-12 ENCOUNTER — Ambulatory Visit: Payer: PPO

## 2016-07-13 ENCOUNTER — Inpatient Hospital Stay (HOSPITAL_COMMUNITY)
Admission: EM | Admit: 2016-07-13 | Discharge: 2016-08-02 | DRG: 871 | Disposition: A | Discharge status: Skilled Nursing Facility | Payer: PPO | Attending: Internal Medicine | Admitting: Internal Medicine

## 2016-07-13 ENCOUNTER — Telehealth: Payer: Self-pay | Admitting: Hematology and Oncology

## 2016-07-13 ENCOUNTER — Inpatient Hospital Stay (HOSPITAL_COMMUNITY): Payer: PPO

## 2016-07-13 ENCOUNTER — Emergency Department (HOSPITAL_COMMUNITY): Payer: PPO

## 2016-07-13 ENCOUNTER — Other Ambulatory Visit (HOSPITAL_BASED_OUTPATIENT_CLINIC_OR_DEPARTMENT_OTHER): Payer: PPO

## 2016-07-13 ENCOUNTER — Encounter: Payer: Self-pay | Admitting: Hematology and Oncology

## 2016-07-13 ENCOUNTER — Encounter: Payer: Self-pay | Admitting: Vascular Surgery

## 2016-07-13 ENCOUNTER — Ambulatory Visit: Payer: PPO

## 2016-07-13 ENCOUNTER — Encounter (HOSPITAL_COMMUNITY): Payer: Self-pay | Admitting: Emergency Medicine

## 2016-07-13 ENCOUNTER — Ambulatory Visit (HOSPITAL_BASED_OUTPATIENT_CLINIC_OR_DEPARTMENT_OTHER): Payer: PPO | Admitting: Hematology and Oncology

## 2016-07-13 DIAGNOSIS — R7989 Other specified abnormal findings of blood chemistry: Secondary | ICD-10-CM

## 2016-07-13 DIAGNOSIS — N189 Chronic kidney disease, unspecified: Secondary | ICD-10-CM

## 2016-07-13 DIAGNOSIS — E860 Dehydration: Secondary | ICD-10-CM | POA: Diagnosis not present

## 2016-07-13 DIAGNOSIS — R739 Hyperglycemia, unspecified: Secondary | ICD-10-CM

## 2016-07-13 DIAGNOSIS — A419 Sepsis, unspecified organism: Secondary | ICD-10-CM

## 2016-07-13 DIAGNOSIS — T451X5A Adverse effect of antineoplastic and immunosuppressive drugs, initial encounter: Secondary | ICD-10-CM

## 2016-07-13 DIAGNOSIS — R1011 Right upper quadrant pain: Secondary | ICD-10-CM

## 2016-07-13 DIAGNOSIS — D61810 Antineoplastic chemotherapy induced pancytopenia: Secondary | ICD-10-CM | POA: Diagnosis present

## 2016-07-13 DIAGNOSIS — Z87891 Personal history of nicotine dependence: Secondary | ICD-10-CM

## 2016-07-13 DIAGNOSIS — C9000 Multiple myeloma not having achieved remission: Secondary | ICD-10-CM

## 2016-07-13 DIAGNOSIS — K819 Cholecystitis, unspecified: Secondary | ICD-10-CM

## 2016-07-13 DIAGNOSIS — I120 Hypertensive chronic kidney disease with stage 5 chronic kidney disease or end stage renal disease: Secondary | ICD-10-CM | POA: Diagnosis present

## 2016-07-13 DIAGNOSIS — I10 Essential (primary) hypertension: Secondary | ICD-10-CM

## 2016-07-13 DIAGNOSIS — R188 Other ascites: Secondary | ICD-10-CM | POA: Diagnosis present

## 2016-07-13 DIAGNOSIS — E43 Unspecified severe protein-calorie malnutrition: Secondary | ICD-10-CM | POA: Diagnosis present

## 2016-07-13 DIAGNOSIS — R1013 Epigastric pain: Secondary | ICD-10-CM | POA: Diagnosis not present

## 2016-07-13 DIAGNOSIS — B961 Klebsiella pneumoniae [K. pneumoniae] as the cause of diseases classified elsewhere: Secondary | ICD-10-CM | POA: Diagnosis present

## 2016-07-13 DIAGNOSIS — R339 Retention of urine, unspecified: Secondary | ICD-10-CM | POA: Diagnosis not present

## 2016-07-13 DIAGNOSIS — A4159 Other Gram-negative sepsis: Principal | ICD-10-CM | POA: Diagnosis present

## 2016-07-13 DIAGNOSIS — D696 Thrombocytopenia, unspecified: Secondary | ICD-10-CM | POA: Diagnosis present

## 2016-07-13 DIAGNOSIS — R6521 Severe sepsis with septic shock: Secondary | ICD-10-CM | POA: Diagnosis not present

## 2016-07-13 DIAGNOSIS — J90 Pleural effusion, not elsewhere classified: Secondary | ICD-10-CM | POA: Diagnosis present

## 2016-07-13 DIAGNOSIS — R52 Pain, unspecified: Secondary | ICD-10-CM

## 2016-07-13 DIAGNOSIS — K810 Acute cholecystitis: Secondary | ICD-10-CM | POA: Diagnosis present

## 2016-07-13 DIAGNOSIS — Z992 Dependence on renal dialysis: Secondary | ICD-10-CM

## 2016-07-13 DIAGNOSIS — I951 Orthostatic hypotension: Secondary | ICD-10-CM | POA: Diagnosis not present

## 2016-07-13 DIAGNOSIS — R630 Anorexia: Secondary | ICD-10-CM | POA: Diagnosis not present

## 2016-07-13 DIAGNOSIS — R112 Nausea with vomiting, unspecified: Secondary | ICD-10-CM

## 2016-07-13 DIAGNOSIS — I959 Hypotension, unspecified: Secondary | ICD-10-CM | POA: Diagnosis not present

## 2016-07-13 DIAGNOSIS — D61818 Other pancytopenia: Secondary | ICD-10-CM | POA: Diagnosis not present

## 2016-07-13 DIAGNOSIS — D631 Anemia in chronic kidney disease: Secondary | ICD-10-CM | POA: Diagnosis present

## 2016-07-13 DIAGNOSIS — N170 Acute kidney failure with tubular necrosis: Secondary | ICD-10-CM | POA: Diagnosis present

## 2016-07-13 DIAGNOSIS — Z79899 Other long term (current) drug therapy: Secondary | ICD-10-CM | POA: Diagnosis not present

## 2016-07-13 DIAGNOSIS — N178 Other acute kidney failure: Secondary | ICD-10-CM

## 2016-07-13 DIAGNOSIS — R110 Nausea: Secondary | ICD-10-CM

## 2016-07-13 DIAGNOSIS — Z6820 Body mass index (BMI) 20.0-20.9, adult: Secondary | ICD-10-CM

## 2016-07-13 DIAGNOSIS — E871 Hypo-osmolality and hyponatremia: Secondary | ICD-10-CM | POA: Diagnosis present

## 2016-07-13 DIAGNOSIS — R748 Abnormal levels of other serum enzymes: Secondary | ICD-10-CM | POA: Diagnosis not present

## 2016-07-13 DIAGNOSIS — N186 End stage renal disease: Secondary | ICD-10-CM | POA: Diagnosis present

## 2016-07-13 DIAGNOSIS — J9601 Acute respiratory failure with hypoxia: Secondary | ICD-10-CM | POA: Diagnosis present

## 2016-07-13 DIAGNOSIS — C7951 Secondary malignant neoplasm of bone: Secondary | ICD-10-CM | POA: Diagnosis present

## 2016-07-13 DIAGNOSIS — R109 Unspecified abdominal pain: Secondary | ICD-10-CM | POA: Diagnosis present

## 2016-07-13 DIAGNOSIS — T380X5A Adverse effect of glucocorticoids and synthetic analogues, initial encounter: Secondary | ICD-10-CM | POA: Diagnosis not present

## 2016-07-13 DIAGNOSIS — F419 Anxiety disorder, unspecified: Secondary | ICD-10-CM | POA: Diagnosis present

## 2016-07-13 DIAGNOSIS — R7309 Other abnormal glucose: Secondary | ICD-10-CM

## 2016-07-13 DIAGNOSIS — K529 Noninfective gastroenteritis and colitis, unspecified: Secondary | ICD-10-CM | POA: Diagnosis present

## 2016-07-13 DIAGNOSIS — R945 Abnormal results of liver function studies: Secondary | ICD-10-CM

## 2016-07-13 DIAGNOSIS — D72829 Elevated white blood cell count, unspecified: Secondary | ICD-10-CM | POA: Diagnosis not present

## 2016-07-13 DIAGNOSIS — R531 Weakness: Secondary | ICD-10-CM | POA: Diagnosis not present

## 2016-07-13 DIAGNOSIS — R1010 Upper abdominal pain, unspecified: Secondary | ICD-10-CM | POA: Diagnosis not present

## 2016-07-13 DIAGNOSIS — R0989 Other specified symptoms and signs involving the circulatory and respiratory systems: Secondary | ICD-10-CM

## 2016-07-13 HISTORY — DX: Thrombocytopenia, unspecified: D69.6

## 2016-07-13 HISTORY — DX: Anemia, unspecified: D64.9

## 2016-07-13 HISTORY — DX: Abnormal results of liver function studies: R94.5

## 2016-07-13 LAB — COMPREHENSIVE METABOLIC PANEL
ALT: 74 U/L — AB (ref 0–55)
ANION GAP: 14 meq/L — AB (ref 3–11)
AST: 67 U/L — AB (ref 5–34)
Albumin: 2.3 g/dL — ABNORMAL LOW (ref 3.5–5.0)
Alkaline Phosphatase: 251 U/L — ABNORMAL HIGH (ref 40–150)
BILIRUBIN TOTAL: 3.43 mg/dL — AB (ref 0.20–1.20)
BUN: 28.6 mg/dL — AB (ref 7.0–26.0)
CALCIUM: 8.4 mg/dL (ref 8.4–10.4)
CO2: 23 mEq/L (ref 22–29)
CREATININE: 3.3 mg/dL — AB (ref 0.7–1.3)
Chloride: 97 mEq/L — ABNORMAL LOW (ref 98–109)
EGFR: 18 mL/min/{1.73_m2} — ABNORMAL LOW (ref >90)
Glucose: 152 mg/dl — ABNORMAL HIGH (ref 70–140)
Potassium: 4.2 mEq/L (ref 3.5–5.1)
Sodium: 134 mEq/L — ABNORMAL LOW (ref 136–145)
TOTAL PROTEIN: 6.4 g/dL (ref 6.4–8.3)

## 2016-07-13 LAB — HEPATIC FUNCTION PANEL
ALT: 74 U/L — AB (ref 10–40)
AST: 67 U/L — AB (ref 14–40)
Alkaline Phosphatase: 251 U/L — AB (ref 25–125)
Bilirubin, Total: 3.4 mg/dL

## 2016-07-13 LAB — CBC WITH DIFFERENTIAL/PLATELET
BASO%: 0.2 % (ref 0.0–2.0)
BASOS ABS: 0 10*3/uL (ref 0.0–0.1)
EOS%: 0 % (ref 0.0–7.0)
Eosinophils Absolute: 0 10*3/uL (ref 0.0–0.5)
HEMATOCRIT: 26.6 % — AB (ref 38.4–49.9)
HGB: 8.6 g/dL — ABNORMAL LOW (ref 13.0–17.1)
LYMPH%: 1.5 % — ABNORMAL LOW (ref 14.0–49.0)
MCH: 29.9 pg (ref 27.2–33.4)
MCHC: 32.5 g/dL (ref 32.0–36.0)
MCV: 92.1 fL (ref 79.3–98.0)
MONO#: 0.7 10*3/uL (ref 0.1–0.9)
MONO%: 5.5 % (ref 0.0–14.0)
NEUT#: 12.6 10*3/uL — ABNORMAL HIGH (ref 1.5–6.5)
NEUT%: 92.8 % — AB (ref 39.0–75.0)
PLATELETS: 86 10*3/uL — AB (ref 140–400)
RBC: 2.89 10*6/uL — ABNORMAL LOW (ref 4.20–5.82)
RDW: 16.6 % — ABNORMAL HIGH (ref 11.0–14.6)
WBC: 13.6 10*3/uL — ABNORMAL HIGH (ref 4.0–10.3)
lymph#: 0.2 10*3/uL — ABNORMAL LOW (ref 0.9–3.3)

## 2016-07-13 LAB — BASIC METABOLIC PANEL
BUN: 29 mg/dL — AB (ref 4–21)
Creatinine: 3.3 mg/dL — AB (ref 0.6–1.3)
Glucose: 152 mg/dL
POTASSIUM: 4.2 mmol/L (ref 3.4–5.3)
Sodium: 134 mmol/L — AB (ref 137–147)

## 2016-07-13 LAB — LIPASE, BLOOD: LIPASE: 15 U/L (ref 11–51)

## 2016-07-13 LAB — LACTIC ACID, PLASMA
LACTIC ACID, VENOUS: 3.4 mmol/L — AB (ref 0.5–1.9)
LACTIC ACID, VENOUS: 3.2 mmol/L — AB (ref 0.5–1.9)

## 2016-07-13 LAB — CORTISOL: Cortisol, Plasma: >100 ug/dL

## 2016-07-13 LAB — I-STAT CG4 LACTIC ACID, ED
LACTIC ACID, VENOUS: 4.74 mmol/L — AB (ref 0.5–1.9)
LACTIC ACID, VENOUS: 2.75 mmol/L — AB (ref 0.5–1.9)

## 2016-07-13 LAB — MRSA PCR SCREENING: MRSA by PCR: NEGATIVE

## 2016-07-13 LAB — CBC AND DIFFERENTIAL
HCT: 27 % — AB (ref 41–53)
Hemoglobin: 8.6 g/dL — AB (ref 13.5–17.5)
Platelets: 86 K/µL — AB (ref 150–399)
WBC: 13.6 10*3/mL

## 2016-07-13 MED ORDER — METRONIDAZOLE IN NACL 5-0.79 MG/ML-% IV SOLN
500.0000 mg | Freq: Once | INTRAVENOUS | Status: AC
  Administered 2016-07-13: 500 mg via INTRAVENOUS
  Filled 2016-07-13: qty 100

## 2016-07-13 MED ORDER — VANCOMYCIN HCL IN DEXTROSE 750-5 MG/150ML-% IV SOLN
750.0000 mg | INTRAVENOUS | Status: DC
  Filled 2016-07-13: qty 150

## 2016-07-13 MED ORDER — SODIUM CHLORIDE 0.9 % IV BOLUS (SEPSIS)
500.0000 mL | Freq: Once | INTRAVENOUS | Status: AC
  Administered 2016-07-13: 500 mL via INTRAVENOUS

## 2016-07-13 MED ORDER — FLUCONAZOLE IN SODIUM CHLORIDE 200-0.9 MG/100ML-% IV SOLN
200.0000 mg | INTRAVENOUS | Status: DC
  Filled 2016-07-13: qty 100

## 2016-07-13 MED ORDER — FAMOTIDINE IN NACL 20-0.9 MG/50ML-% IV SOLN: 20.0000 mg | Freq: Two times a day (BID) | INTRAVENOUS | Status: DC

## 2016-07-13 MED ORDER — NOREPINEPHRINE BITARTRATE 1 MG/ML IV SOLN
0.0000 ug/min | Freq: Once | INTRAVENOUS | Status: DC
  Filled 2016-07-13: qty 4

## 2016-07-13 MED ORDER — SODIUM CHLORIDE 0.9 % IV BOLUS (SEPSIS)
500.0000 mL | Freq: Once | INTRAVENOUS | Status: AC
Start: 2016-07-13 — End: 2016-07-13
  Administered 2016-07-13: 500 mL via INTRAVENOUS

## 2016-07-13 MED ORDER — PHENYLEPHRINE HCL 10 MG/ML IJ SOLN
30.0000 ug/min | INTRAMUSCULAR | Status: DC
  Administered 2016-07-13: 30 ug/min via INTRAVENOUS
  Administered 2016-07-13 – 2016-07-14 (×2): 120 ug/min via INTRAVENOUS
  Administered 2016-07-14: 100 ug/min via INTRAVENOUS
  Administered 2016-07-14: 120 ug/min via INTRAVENOUS
  Administered 2016-07-14: 7 ug/min via INTRAVENOUS
  Administered 2016-07-14: 80 ug/min via INTRAVENOUS
  Administered 2016-07-14: 30 ug/min via INTRAVENOUS
  Filled 2016-07-13 (×8): qty 1

## 2016-07-13 MED ORDER — SODIUM CHLORIDE 0.9 % IV SOLN
INTRAVENOUS | Status: DC
  Administered 2016-07-13: 125 mL/h via INTRAVENOUS

## 2016-07-13 MED ORDER — CIPROFLOXACIN IN D5W 400 MG/200ML IV SOLN
400.0000 mg | Freq: Once | INTRAVENOUS | Status: AC
  Administered 2016-07-13: 400 mg via INTRAVENOUS
  Filled 2016-07-13: qty 200

## 2016-07-13 MED ORDER — SODIUM CHLORIDE 0.9 % IV BOLUS (SEPSIS): 1000.0000 mL | Freq: Once | INTRAVENOUS | Status: DC

## 2016-07-13 MED ORDER — DEXTROSE 5 % IV SOLN
1.0000 g | Freq: Three times a day (TID) | INTRAVENOUS | Status: DC
  Administered 2016-07-13 – 2016-07-14 (×3): 1 g via INTRAVENOUS
  Filled 2016-07-13 (×6): qty 1

## 2016-07-13 MED ORDER — LEVOFLOXACIN IN D5W 500 MG/100ML IV SOLN
500.0000 mg | INTRAVENOUS | Status: DC
  Filled 2016-07-13: qty 100

## 2016-07-13 MED ORDER — FAMOTIDINE IN NACL 20-0.9 MG/50ML-% IV SOLN
20.0000 mg | INTRAVENOUS | Status: DC
  Administered 2016-07-14 – 2016-07-19 (×6): 20 mg via INTRAVENOUS
  Filled 2016-07-13 (×8): qty 50

## 2016-07-13 MED ORDER — PROMETHAZINE HCL 25 MG/ML IJ SOLN
12.5000 mg | Freq: Once | INTRAMUSCULAR | Status: AC
  Administered 2016-07-13: 12.5 mg via INTRAVENOUS
  Filled 2016-07-13: qty 1

## 2016-07-13 MED ORDER — SODIUM CHLORIDE 0.9 % IV SOLN
Freq: Once | INTRAVENOUS | Status: AC
  Administered 2016-07-13: 1000 mL/h via INTRAVENOUS

## 2016-07-13 MED ORDER — FLUCONAZOLE IN SODIUM CHLORIDE 400-0.9 MG/200ML-% IV SOLN
400.0000 mg | Freq: Once | INTRAVENOUS | Status: AC
  Administered 2016-07-13: 400 mg via INTRAVENOUS
  Filled 2016-07-13: qty 200

## 2016-07-13 MED ORDER — VANCOMYCIN HCL 10 G IV SOLR
1500.0000 mg | Freq: Once | INTRAVENOUS | Status: AC
  Administered 2016-07-13: 1500 mg via INTRAVENOUS
  Filled 2016-07-13: qty 1500

## 2016-07-13 MED ORDER — ONDANSETRON HCL 4 MG/2ML IJ SOLN
4.0000 mg | Freq: Once | INTRAMUSCULAR | Status: AC
  Administered 2016-07-13: 4 mg via INTRAVENOUS
  Filled 2016-07-13: qty 2

## 2016-07-13 MED ORDER — METRONIDAZOLE IN NACL 5-0.79 MG/ML-% IV SOLN
500.0000 mg | Freq: Three times a day (TID) | INTRAVENOUS | Status: DC
  Administered 2016-07-13 – 2016-07-17 (×8): 500 mg via INTRAVENOUS
  Filled 2016-07-13 (×11): qty 100

## 2016-07-13 NOTE — Assessment & Plan Note (Signed)
Pancytopenia has improved since discontinuation of Revlimid. He does not need transfusion. Continue close monitoring for now The anemia is related to chronic renal failure. He is getting ESA through dialysis center

## 2016-07-13 NOTE — ED Triage Notes (Signed)
Pt in from primary care doc after abnormal labs, RUQ abdominal and leg pain. Pt states he has "bad gallbladder" and some labs were elevated today. States he has been vomiting clear since yesterday. Denies dizziness, diarrhea or cp. 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 Alert, VSS

## 2016-07-13 NOTE — Progress Notes (Signed)
Patient arrived from ED, drowsy, Oriented x4. Breathe sounds clear, unlabored on 2L [place]. Sinus tach.  BP  71/53, MD aware.  No immediate distress, no c/o pain

## 2016-07-13 NOTE — Progress Notes (Signed)
eLink Physician-Brief Progress Note Patient Name: Thomas [name] DOB: 12/10/1946 MRN: [id number]   Date of Service  07/13/2016  HPI/Events of Note  Order clarification  eICU Interventions  Change order of HIDA per radiologist recommendations Changed to STAT     Intervention Category Intermediate Interventions: Communication with other healthcare providers and/or family  Simonne Maffucci 07/13/2016, 10:34 PM

## 2016-07-13 NOTE — Telephone Encounter (Signed)
I spoke with the triage nurse at the [facility] emergency department. The patient is seen today because he was not feeling well with nausea, right upper quadrant pain and inability to tolerate oral intake. Examination revealed tenderness in the right upper quadrant and blood work today showed persistent pancytopenia with new liver function test abnormalities suggestive of possible acute cholecystitis. I directed the patient to [facility] emergency department because the patient needs urgent care and he is also a dialysis patient.

## 2016-07-13 NOTE — Consult Note (Signed)
Reason for Consult: Continuity of ESRD care Referring Physician: Merrie Roof M.D. 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 He presented with one-day history of nausea, vomiting and abdominal pain and some preceding diarrhea prior to that. Reports decreased ability to tolerate oral intake. In the emergency room he was noted to be hypotensive and ultrasound suggestive of acalculous cholecystitis-seen by surgery and awaiting HIDA scan. He remained hypotensive upon transfer to the ICU and was started on pressors.  He reports that he last went to dialysis on Tuesday and did not have any problems.  Dialysis prescription: Tuesday/Thursday/Saturday at [facility]. 4 hours, 180 dialyzer, BFR 400, DFR 800, EDW 73 kg, 3K/2.25 calcium, no UF profile, no sodium modeling. Aranesp 40 g weekly, heparin bolus 73,000 units, tunneled RIJ dialysis catheter with maturing left BCF.  Past Medical History:  Diagnosis Date  . Cancer ([place])   . Chronic kidney disease   . Compression fracture     Past Surgical History:  Procedure Laterality Date  . AV FISTULA PLACEMENT Left 05/24/2016   Procedure: ARTERIOVENOUS (AV) FISTULA CREATION-LEFT;  Surgeon: Conrad West Orange, MD;  Location: [place];  Service: Vascular;  Laterality: Left;  . BONE BIOPSY Left 05/14/2016   Procedure: LEFT FEMORAL BIOPSY WITH INTRAOPERATIVE FROZEN SECTIONS ;  Surgeon: Rod Can, MD;  Location: [place];  Service: Orthopedics;  Laterality: Left;  . FEMUR IM NAIL Left 05/14/2016   Procedure: INTRAMEDULLARY (IM) NAIL FEMORAL;  Surgeon: Rod Can, MD;  Location: [place];  Service: Orthopedics;  Laterality: Left;  . MOUTH SURGERY      Family History  Problem Relation Age of Onset  . Diabetes Father     Social History:   reports that he has quit smoking. He has never used smokeless tobacco. He reports that he does not drink alcohol or use drugs.  Allergies:  Allergies  Allergen Reactions  . Penicillins Rash and Other (See Comments)    Has patient had a PCN reaction causing immediate rash, facial/tongue/throat swelling, SOB or lightheadedness with hypotension: YES + Reaction causing SEVERE RASH involving MUCUS MEMBRANES or SKIN NECROSIS >> YES Reaction that required hospitalization: NO Reaction occurring within the last 10 years: NO If all of the above answers are "NO", then may proceed with Cephalosporin use.   . Dilaudid [Hydromorphone Hcl] Other (See Comments)    hallucinations  . Codeine Other (See Comments)    "MAKES ME JUMPY"    Medications:  Scheduled: . aztreonam  1 g Intravenous Q8H  . famotidine (PEPCID) IV  20 mg Intravenous Q24H  . [START ON 07/14/2016] fluconazole (DIFLUCAN) IV  200 mg Intravenous Q24H  . fluconazole (DIFLUCAN) IV  400 mg Intravenous Once  . [START ON 07/14/2016] levofloxacin (LEVAQUIN) IV  500 mg Intravenous Q48H  . metronidazole  500 mg Intravenous Q8H  . norepinephrine (LEVOPHED) Adult infusion  0-40 mcg/min Intravenous Once  . [START ON 07/14/2016] vancomycin  750 mg Intravenous Q24H    Results for orders placed or performed during the hospital encounter of 07/13/16 (from the past 48 hour(s))  Lipase, blood     Status: None   Collection Time: 07/13/16  9:32 AM  Result Value Ref Range   Lipase 15 11 - 51 U/L  I-Stat CG4 Lactic Acid, ED  Status: Abnormal   Collection Time: 07/13/16  9:46 AM  Result Value Ref Range   Lactic Acid, Venous 4.74 (HH) 0.5 - 1.9 mmol/L   Comment NOTIFIED PHYSICIAN   I-Stat CG4 Lactic Acid, ED     Status: Abnormal   Collection Time: 07/13/16  3:00 PM  Result Value Ref Range   Lactic Acid, Venous 2.75 (HH) 0.5 - 1.9 mmol/L   Comment NOTIFIED PHYSICIAN    BMP Latest Ref Rng & Units 07/13/2016 07/10/2016 07/03/2016  Glucose 70 - 140 mg/dl  152(H) 125 150(H)  BUN 7.0 - 26.0 mg/dL 28.6(H) 15.0 34.8(H)  Creatinine 0.7 - 1.3 mg/dL 3.3(HH) 3.4(HH) 4.1(HH)  Sodium 136 - 145 mEq/L 134(L) 134(L) 130(L)  Potassium 3.5 - 5.1 mEq/L 4.2 3.6 4.2  Chloride 101 - 111 mmol/L - - -  CO2 22 - 29 mEq/L _0 Calcium 8.4 - 10.4 mg/dL 8.4 8.5 8.1(L)   CBC Latest Ref Rng & Units 07/13/2016 07/10/2016 07/03/2016  WBC 4.0 - 10.3 10e3/uL 13.6(H) 6.3 9.3  Hemoglobin 13.0 - 17.1 g/dL 8.6(L) 8.9(L) 8.3(L)  Hematocrit 38.4 - 49.9 % 26.6(L) 26.2(L) 25.0(L)  Platelets 140 - 400 10e3/uL 86(L) 38(L) 197     Dg Chest Port 1 View  Result Date: 07/13/2016 CLINICAL DATA:  Effusion. Abnormal labs.Hx of CKD, dialysis patient.Ex-smoker, quit ~30 years ago. EXAM: PORTABLE CHEST 1 VIEW COMPARISON:  05/24/2016 FINDINGS: Cardiac silhouette is normal in size. No mediastinal or hilar masses or evidence of adenopathy. There is vascular congestion. Left greater than right pleural effusions are noted. There is some hazy opacity in the lung bases, likely due to atelectasis. No pneumothorax. Right internal jugular dual-lumen central venous catheter is stable and well positioned. IMPRESSION: 1. Small, left greater than right, pleural effusions with associated lung base opacity, most likely atelectasis. 2. Central vascular congestion without overt pulmonary edema. Electronically Signed   By: Lajean Manes M.D.   On: 07/13/2016 17:27   US Abdomen Limited Ruq  Result Date: 07/13/2016 CLINICAL DATA:  Right upper quadrant pain for 2 days. Nausea and vomiting for 1 day. EXAM: US ABDOMEN LIMITED - RIGHT UPPER QUADRANT COMPARISON:  None. FINDINGS: Gallbladder: The gallbladder is mildly distended measuring 5.2 cm in transverse dimension. This also mild gallbladder wall thickening and gallbladder sludge. No definite gallstones or pericholecystic fluid. Negative sonographic Murphy sign. Common bile duct: Diameter: 4.1 mm Liver: Normal echogenicity without focal lesion or biliary dilatation.  Small amount of right upper quadrant ascites. Right pleural effusion noted. IMPRESSION: 1. Distended gallbladder with mild gallbladder wall thickening and gallbladder sludge. Could not exclude acalculous cholecystitis. 2. Normal caliber common bile duct. 3. Normal liver. 4. Small amount of right upper quadrant ascites and right pleural effusion. Electronically Signed   By: Marijo Sanes M.D.   On: 07/13/2016 12:05    Review of Systems  Constitutional: Positive for chills, malaise/fatigue and weight loss. Negative for fever.  HENT: Positive for hearing loss. Negative for ear pain and tinnitus.        Chronically hard of hearing  Eyes: Negative.   Respiratory: Negative.   Cardiovascular: Positive for leg swelling. Negative for palpitations, orthopnea and claudication.  Gastrointestinal: Positive for abdominal pain, diarrhea, nausea and vomiting. Negative for blood in stool and melena.  Genitourinary: Negative.   Musculoskeletal: Positive for back pain and joint pain.  Skin: Negative.   Neurological: Positive for weakness. Negative for dizziness, focal weakness and headaches.  Psychiatric/Behavioral: The patient is nervous/anxious.    Blood pressure Marland Kitchen)  90/54, pulse (!) 105, temperature 98.1 F (36.7 C), temperature source Oral, resp. rate (!) 27, SpO2 95 %. Physical Exam  Nursing note and vitals reviewed. Constitutional: He appears well-nourished.  Appears weak and withdrawn  HENT:  Head: Normocephalic and atraumatic.  Dry oropharynx  Eyes: EOM are normal. Pupils are equal, round, and reactive to light.  Neck: Normal range of motion. Neck supple. No JVD present.  Cardiovascular: Regular rhythm and normal heart sounds.  Exam reveals no friction rub.   No murmur heard. Regular tachycardia  Respiratory: Effort normal and breath sounds normal. No respiratory distress. He has no wheezes.  GI: Soft. He exhibits no distension. There is tenderness. There is guarding.  Tenderness over both  upper quadrants  Musculoskeletal: He exhibits edema.  1+ bilateral lower extremity edema  Neurological: He is alert.  Difficult to assess orientation with hardness of hearing  Skin: Skin is warm and dry. No erythema.  Psychiatric:  Appears withdrawn and of depressed mood    Assessment/Plan: 1. Abdominal pain/nausea/vomiting: Possible acalculous cholecystitis-seen earlier by surgery and awaiting HIDA scan for further management. Overall appears to be very deconditioned. 2. End-stage renal disease: Secondary to multiple myeloma and getting hemodialysis via right IJ tunneled dialysis catheter. No acute indications for dialysis tonight (and he is hypotensive and currently on pressors). Will evaluate tomorrow morning for possible dialysis needs versus CRRT. Labs indicate that he in fact might be having some renal recovery to allow time off dialysis. 3. 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 4. Multiple myeloma with bony metastasis: On chemotherapy/radiation to left femur-further management per hematology 5. CKD-MBD: Currently nothing by mouth for evaluation and management of acalculous cholecystitis, will follow for indications to start binder. Appears that he is not on VDRA.  Sunil Hue K. 07/13/2016, 7:52 PM

## 2016-07-13 NOTE — Progress Notes (Signed)
[facility] Progress Note Patient Name: Matas Laurenzi DOB: 09-15-1946 MRN: [id number]   Date of Service  07/13/2016  HPI/Events of Note  Elevated lactic acid, drawn before starting pressors, now BP improved  eICU Interventions  Repeat 3 hours     Intervention Category Intermediate Interventions: Diagnostic test evaluation  Simonne Maffucci 07/13/2016, 8:56 PM

## 2016-07-13 NOTE — ED Notes (Signed)
MD at bedside. Dr. Hulen Skains.

## 2016-07-13 NOTE — Progress Notes (Signed)
Pharmacy Antibiotic Note Kim Bolon is a 70 y.o. male admitted on 07/13/2016 with sepsis.  Pharmacy has been consulted for Azactam, Levaquin, Flagyl and vancomycin dosing.  Plan: 1. Azactam 1 gram IV every 8 hours 2. Levaquin 500 mg IV every 48 hours starting on 8/4 since already received ciprofloxacin in ER earlier today 3. Metronidazole 500 mg IV every 8 hours  4. Vancomycin 1500 mg x 1 now followed by 750 mg every 24 hours starting on 8/4     Temp (24hrs), Avg:98.5 F (36.9 C), Min:97.8 F (36.6 C), Max:99.6 F (37.6 C)   Recent Labs Lab 07/10/16 1019 07/10/16 1019 07/13/16 0810 07/13/16 0810 07/13/16 0946 07/13/16 1500  WBC  --  6.3 13.6*  --   --   --   CREATININE 3.4*  --   --  3.3*  --   --   LATICACIDVEN  --   --   --   --  4.74* 2.75*    Estimated Creatinine Clearance: 22.5 mL/min (by C-G formula based on SCr of 3.3 mg/dL).    Allergies  Allergen Reactions  . Penicillins Rash and Other (See Comments)    Has patient had a PCN reaction causing immediate rash, facial/tongue/throat swelling, SOB or lightheadedness with hypotension: YES + Reaction causing SEVERE RASH involving MUCUS MEMBRANES or SKIN NECROSIS >> YES Reaction that required hospitalization: NO Reaction occurring within the last 10 years: NO If all of the above answers are "NO", then may proceed with Cephalosporin use.   . Dilaudid [Hydromorphone Hcl] Other (See Comments)    hallucinations  . Codeine Other (See Comments)    "MAKES ME JUMPY"    Antimicrobials this admission: 8/3 Azactam >> 8/3 Fluconazole >> 8/4 Levaquin >> 8/3 Flagyl >> 8/3 Vancomycin >>  8/3 Cipro x 1 Dose adjustments this admission: n/a  Microbiology results: 8/3 BCx: px 8/3 UCx: px   Thank you for allowing pharmacy to be a part of this patient's care.  Vincenza Hews, PharmD, BCPS 07/13/2016, 5:42 PM Pager: [phone]

## 2016-07-13 NOTE — Progress Notes (Signed)
eLink Physician-Brief Progress Note Patient Name: Thomas [name] DOB: May 21, 1946 MRN: [id number]   Date of Service  07/13/2016  HPI/Events of Note  Hypotensive despite fluid resuscitation  eICU Interventions  neosynephrine titrated to MAP > 65     Intervention Category Major Interventions: Hypotension - evaluation and management  Simonne Maffucci 07/13/2016, 6:36 PM

## 2016-07-13 NOTE — Progress Notes (Signed)
Thomas [name] OFFICE PROGRESS NOTE  Patient Care Team: No Pcp Per Patient as PCP - General (General Practice)  SUMMARY OF ONCOLOGIC HISTORY:   Multiple myeloma not having achieved remission ([place])   05/09/2016 - 06/07/2016 Hospital Admission    The patient was hospitalized for pancytopenia, acute renal failure and back pain. He was diagnosed with multiple myeloma requiring multiple surgery and hemodialysis. He was started on chemotherapy while hospitalized     05/11/2016 Pathology Results    Accession: [id number] kidney biopsy confimed myeloma involvement     05/12/2016 Imaging    Skeletal survey showed multiple bone lesions are suggestive for metastatic bone disease and suspicious for multiple myeloma. There is imminent pathological fracture of the left femur     05/14/2016 Pathology Results    Accession: [id number] biopsy from left femur confirmed myeloma     05/16/2016 Pathology Results    Bone marrow complex cytogenetics. Please see report     05/16/2016 Imaging    MRI spine: large 6.0 x 9.0 x 7.5 cm lesion with associated extraosseous extension of tumor into the left paraspinous soft tissues at L5 with moderate to severe canal stenosis. There is L3 compression fracture     05/16/2016 Bone Marrow Biopsy    Accession: [id number] bone marrow biopsy confirmed 60% plasma cell involvement     05/18/2016 -  Chemotherapy    He was started on Velcade and dexamethasone     07/03/2016 -  Chemotherapy    Low dose Revlimid is added      INTERVAL HISTORY: Please see below for problem oriented charting. He is seen urgently because he is not feeling well. He had acute onset of right upper quadrant pain nausea and vomiting. No bowel movement recently. He felt bloated The patient denies any recent signs or symptoms of bleeding such as spontaneous epistaxis, hematuria or hematochezia. He denies recent fever or chills  REVIEW OF SYSTEMS:   Constitutional: Denies fevers, chills or abnormal  weight loss Eyes: Denies blurriness of vision Ears, nose, mouth, throat, and face: Denies mucositis or sore throat Respiratory: Denies cough, dyspnea or wheezes Cardiovascular: Denies palpitation, chest discomfort or lower extremity swelling Skin: Denies abnormal skin rashes Lymphatics: Denies new lymphadenopathy or easy bruising Neurological:Denies numbness, tingling or new weaknesses Behavioral/Psych: Mood is stable, no new changes  All other systems were reviewed with the patient and are negative.  I have reviewed the past medical history, past surgical history, social history and family history with the patient and they are unchanged from previous note.  ALLERGIES:  is allergic to penicillins; dilaudid [hydromorphone hcl]; and codeine.  MEDICATIONS:  No current facility-administered medications for this visit.    Current Outpatient Prescriptions  Medication Sig Dispense Refill  . acetaminophen (TYLENOL) 325 MG tablet Take 2 tablets (650 mg total) by mouth every 6 (six) hours as needed for mild pain (or Fever >/= 101). 15 tablet 0  . acyclovir (ZOVIRAX) 200 MG capsule Take 1 capsule (200 mg total) by mouth 2 (two) times daily. 60 capsule 9  . calcium acetate (PHOSLO) 667 MG capsule Take 1 capsule (667 mg total) by mouth 2 (two) times daily with a meal. 60 capsule 1  . cyanocobalamin 1000 MCG tablet Take 1 tablet (1,000 mcg total) by mouth daily. 30 tablet 0  . dexamethasone (DECADRON) 4 MG tablet Take 5 tablets (20 mg total) by mouth once a week. 60 tablet 1  . diphenoxylate-atropine (LOMOTIL) 2.5-0.025 MG tablet Take 1 tablet by mouth 4 (  four) times daily as needed for diarrhea or loose stools. 90 tablet 0  . loperamide (IMODIUM A-D) 2 MG tablet Take 2 mg by mouth 4 (four) times daily as needed for diarrhea or loose stools.    Marland Kitchen loratadine (CLARITIN) 10 MG tablet Take 10 mg by mouth daily as needed for allergies.     . multivitamin (RENA-VIT) TABS tablet Take 1 tablet by mouth at  bedtime. 30 tablet 0  . ondansetron (ZOFRAN) 8 MG tablet Take 1 tablet (8 mg total) by mouth every 8 (eight) hours as needed for nausea. 30 tablet 3  . pantoprazole (PROTONIX) 40 MG tablet Take 1 tablet (40 mg total) by mouth daily. 30 tablet 1  . tobramycin (TOBREX) 0.3 % ophthalmic solution Place 1 drop into both eyes every 4 (four) hours. 5 mL 0  . triamcinolone (NASACORT AQ) 55 MCG/ACT AERO nasal inhaler Place 2 sprays into the nose daily. 1 Inhaler 1  . Vitamin D, Ergocalciferol, (DRISDOL) 50000 units CAPS capsule Take 1 capsule (50,000 Units total) by mouth every 7 (seven) days. 60 capsule 0  . ALPRAZolam (XANAX) 0.25 MG tablet Take 1 tablet (0.25 mg total) by mouth 2 (two) times daily as needed for anxiety. 30 tablet 0  . lenalidomide (REVLIMID) 2.5 MG capsule Take 1 capsule daily for 14 days, then off 7 days 14 capsule 11   Facility-Administered Medications Ordered in Other Visits  Medication Dose Route Frequency Provider Last Rate Last Dose  . 0.9 %  sodium chloride infusion   Intravenous Continuous Blanchie Dessert, MD 125 mL/hr at 07/13/16 1304 125 mL/hr at 07/13/16 1304  . ciprofloxacin (CIPRO) IVPB 400 mg  400 mg Intravenous Once Blanchie Dessert, MD 200 mL/hr at 07/13/16 1448 400 mg at 07/13/16 1448   And  . metroNIDAZOLE (FLAGYL) IVPB 500 mg  500 mg Intravenous Once Blanchie Dessert, MD      . sodium chloride 0.9 % bolus 500 mL  500 mL Intravenous Once Blanchie Dessert, MD 500 mL/hr at 07/13/16 1447 500 mL at 07/13/16 1447  . sodium chloride 0.9 % bolus 500 mL  500 mL Intravenous Once Blanchie Dessert, MD        PHYSICAL EXAMINATION: ECOG PERFORMANCE STATUS: 2 - Symptomatic, <50% confined to bed  Vitals:   07/13/16 0827  BP: (!) 92/47  Pulse: 91  Resp: 17  Temp: 98.2 F (36.8 C)   Filed Weights    GENERAL:alert, no distress and comfortable SKIN: He looks pale with mild jaundice EYES: normal, Conjunctiva are pale and jaundiced OROPHARYNX: He has dry mucous membrane.  No thrush NECK: supple, thyroid normal size, non-tender, without nodularity LYMPH:  no palpable lymphadenopathy in the cervical, axillary or inguinal LUNGS: clear to auscultation and percussion with normal breathing effort HEART: regular rate & rhythm and no murmurs and no lower extremity edema ABDOMEN:abdomen soft, with mild right upper quadrant discomfort and mild abdominal distention Musculoskeletal:no cyanosis of digits and no clubbing  NEURO: alert & oriented x 3 with fluent speech, no focal motor/sensory deficits  LABORATORY DATA:  I have reviewed the data as listed    Component Value Date/Time   NA 134 (L) 07/13/2016 0810   K 4.2 07/13/2016 0810   CL 97 (L) 06/08/2016 0700   CO2 23 07/13/2016 0810   GLUCOSE 152 (H) 07/13/2016 0810   BUN 28.6 (H) 07/13/2016 0810   CREATININE 3.3 (HH) 07/13/2016 0810   CALCIUM 8.4 07/13/2016 0810   PROT 6.4 07/13/2016 0810   ALBUMIN 2.3 (L)  07/13/2016 0810   AST 67 (H) 07/13/2016 0810   ALT 74 (H) 07/13/2016 0810   ALKPHOS 251 (H) 07/13/2016 0810   BILITOT 3.43 (H) 07/13/2016 0810   GFRNONAA 14 (L) 06/08/2016 0700   GFRAA 16 (L) 06/08/2016 0700    No results found for: SPEP, UPEP  Lab Results  Component Value Date   WBC 13.6 (H) 07/13/2016   NEUTROABS 12.6 (H) 07/13/2016   HGB 8.6 (L) 07/13/2016   HCT 26.6 (L) 07/13/2016   MCV 92.1 07/13/2016   PLT 86 (L) 07/13/2016      Chemistry      Component Value Date/Time   NA 134 (L) 07/13/2016 0810   K 4.2 07/13/2016 0810   CL 97 (L) 06/08/2016 0700   CO2 23 07/13/2016 0810   BUN 28.6 (H) 07/13/2016 0810   CREATININE 3.3 (HH) 07/13/2016 0810      Component Value Date/Time   CALCIUM 8.4 07/13/2016 0810   ALKPHOS 251 (H) 07/13/2016 0810   AST 67 (H) 07/13/2016 0810   ALT 74 (H) 07/13/2016 0810   BILITOT 3.43 (H) 07/13/2016 0810       RADIOGRAPHIC STUDIES: I have personally reviewed the radiological images as listed and agreed with the findings in the report. US Abdomen  Limited Ruq  Result Date: 07/13/2016 CLINICAL DATA:  Right upper quadrant pain for 2 days. Nausea and vomiting for 1 day. EXAM: US ABDOMEN LIMITED - RIGHT UPPER QUADRANT COMPARISON:  None. FINDINGS: Gallbladder: The gallbladder is mildly distended measuring 5.2 cm in transverse dimension. This also mild gallbladder wall thickening and gallbladder sludge. No definite gallstones or pericholecystic fluid. Negative sonographic Murphy sign. Common bile duct: Diameter: 4.1 mm Liver: Normal echogenicity without focal lesion or biliary dilatation. Small amount of right upper quadrant ascites. Right pleural effusion noted. IMPRESSION: 1. Distended gallbladder with mild gallbladder wall thickening and gallbladder sludge. Could not exclude acalculous cholecystitis. 2. Normal caliber common bile duct. 3. Normal liver. 4. Small amount of right upper quadrant ascites and right pleural effusion. Electronically Signed   By: Marijo Sanes M.D.   On: 07/13/2016 12:05     ASSESSMENT & PLAN:  Multiple myeloma not having achieved remission ([place]) I review his recent test results with him and family. Serum light chain has dropped significantly to near normal ratio Due to recent pancytopenia, treatment is placed on hold I will see him back in 2 weeks to resume treatment if pancytopenia resolved  Antineoplastic chemotherapy induced pancytopenia ([facility]) Pancytopenia has improved since discontinuation of Revlimid. He does not need transfusion. Continue close monitoring for now The anemia is related to chronic renal failure. He is getting ESA through dialysis center  N&V (nausea and vomiting) This is new onset. Clinically, there is signs of dehydration. With acute elevated LFT, I suspect he may have acute cholecystitis. I recommend continue anti-emetics but directed him to the emergency department  Abdominal pain He has right upper quadrant pain and acute elevated LFT. I suspect he may have acute cholecystitis and he is  directed to the emergency department   No orders of the defined types were placed in this encounter.  All questions were answered. The patient knows to call the clinic with any problems, questions or concerns. No barriers to learning was detected. I spent 25 minutes counseling the patient face to face. The total time spent in the appointment was 30 minutes and more than 50% was on counseling and review of test results     [facility], Jaaziel Peatross, MD  07/13/2016 3:37 PM

## 2016-07-13 NOTE — Consult Note (Signed)
Reason for Consult:Possible acute cholecystitis Referring Physician: Flory Steever is an 70 y.o. male.  HPI: Abdominal pain with abnormal LFTs sent from [facility] office.  Suggestion of possible mild cholecystitis on [place].  Past Medical History:  Diagnosis Date  . Cancer ([place])   . Chronic kidney disease   . Compression fracture     Past Surgical History:  Procedure Laterality Date  . AV FISTULA PLACEMENT Left 05/24/2016   Procedure: ARTERIOVENOUS (AV) FISTULA CREATION-LEFT;  Surgeon: Conrad Seadrift, MD;  Location: [place];  Service: Vascular;  Laterality: Left;  . BONE BIOPSY Left 05/14/2016   Procedure: LEFT FEMORAL BIOPSY WITH INTRAOPERATIVE FROZEN SECTIONS ;  Surgeon: Rod Can, MD;  Location: [place];  Service: Orthopedics;  Laterality: Left;  . FEMUR IM NAIL Left 05/14/2016   Procedure: INTRAMEDULLARY (IM) NAIL FEMORAL;  Surgeon: Rod Can, MD;  Location: [place];  Service: Orthopedics;  Laterality: Left;  . MOUTH SURGERY      Family History  Problem Relation Age of Onset  . Diabetes Father     Social History:  reports that he has quit smoking. He has never used smokeless tobacco. He reports that he does not drink alcohol or use drugs.  Allergies:  Allergies  Allergen Reactions  . Penicillins Rash and Other (See Comments)    Has patient had a PCN reaction causing immediate rash, facial/tongue/throat swelling, SOB or lightheadedness with hypotension: YES + Reaction causing SEVERE RASH involving MUCUS MEMBRANES or SKIN NECROSIS >> YES Reaction that required hospitalization: NO Reaction occurring within the last 10 years: NO If all of the above answers are "NO", then may proceed with Cephalosporin use.   . Dilaudid [Hydromorphone Hcl] Other (See Comments)    hallucinations  . Codeine Other (See Comments)    "MAKES ME JUMPY"    Medications: I have reviewed the patient's current medications.  Results for orders placed or performed during the hospital encounter of  07/13/16 (from the past 48 hour(s))  Lipase, blood     Status: None   Collection Time: 07/13/16  9:32 AM  Result Value Ref Range   Lipase 15 11 - 51 U/L  I-Stat CG4 Lactic Acid, ED     Status: Abnormal   Collection Time: 07/13/16  9:46 AM  Result Value Ref Range   Lactic Acid, Venous 4.74 (HH) 0.5 - 1.9 mmol/L   Comment NOTIFIED PHYSICIAN     US Abdomen Limited Ruq  Result Date: 07/13/2016 CLINICAL DATA:  Right upper quadrant pain for 2 days. Nausea and vomiting for 1 day. EXAM: US ABDOMEN LIMITED - RIGHT UPPER QUADRANT COMPARISON:  None. FINDINGS: Gallbladder: The gallbladder is mildly distended measuring 5.2 cm in transverse dimension. This also mild gallbladder wall thickening and gallbladder sludge. No definite gallstones or pericholecystic fluid. Negative sonographic Murphy sign. Common bile duct: Diameter: 4.1 mm Liver: Normal echogenicity without focal lesion or biliary dilatation. Small amount of right upper quadrant ascites. Right pleural effusion noted. IMPRESSION: 1. Distended gallbladder with mild gallbladder wall thickening and gallbladder sludge. Could not exclude acalculous cholecystitis. 2. Normal caliber common bile duct. 3. Normal liver. 4. Small amount of right upper quadrant ascites and right pleural effusion. Electronically Signed   By: Marijo Sanes M.D.   On: 07/13/2016 12:05    Review of Systems  Constitutional: Positive for malaise/fatigue. Negative for chills and fever.  Gastrointestinal: Positive for abdominal pain, nausea and vomiting.  All other systems reviewed and are negative.  Blood pressure 103/57, pulse 105, temperature  99.6 F (37.6 C), temperature source Oral, resp. rate 18, SpO2 96 %. Physical Exam  Constitutional: He is oriented to person, place, and time. Vital signs are normal. He appears well-developed. He has a sickly appearance.  Mildly cachectic  HENT:  Head: Normocephalic and atraumatic.  Eyes: EOM are normal. Pupils are equal, round, and  reactive to light. Scleral icterus is present.  Neck: Normal range of motion. Neck supple.  Cardiovascular: Normal rate and regular rhythm.   Respiratory: Effort normal and breath sounds normal.  GI: Soft. Normal appearance. There is tenderness (Very mild and no sonographic Murphy's sign) in the right upper quadrant. There is no rigidity, no rebound and no guarding.  Musculoskeletal: Normal range of motion.  Neurological: He is alert and oriented to person, place, and time. He has normal reflexes.  Skin: Skin is warm and dry. There is pallor.  Psychiatric: He has a normal mood and affect. His behavior is normal. Judgment and thought content normal.    Assessment/Plan: Patient sent over for evaluation because of abnormal LFT's and RUQ tenderness.  His Ultrasound herer does not show any stones, possible sludge, and no sonographic Murphy's sign.    He does not appear to have a dilated CBD, but his Tbili is > 3.    He needs a HIDA scan initially, maybe a CT scan, but right nnow the findings are soft for acute cholecystitis.  He needs to be admitted to medicine and started on antibiotic for the possibility of cholecystitis.  We will follow.    Terre Zabriskie 07/13/2016, 2:01 PM

## 2016-07-13 NOTE — Assessment & Plan Note (Signed)
He has right upper quadrant pain and acute elevated LFT. I suspect he may have acute cholecystitis and he is directed to the emergency department

## 2016-07-13 NOTE — Progress Notes (Signed)
eLink Physician-Brief Progress Note Patient Name: Zylan Marinaro DOB: November 09, 1946 MRN: [id number]   Date of Service  07/13/2016  HPI/Events of Note  Septic shock HIDA not done  eICU Interventions  Change to STAT Bedside informed     Intervention Category Major Interventions: Sepsis - evaluation and management  Simonne Maffucci 07/13/2016, 9:01 PM

## 2016-07-13 NOTE — ED Notes (Addendum)
MD at bedside.Dr. Harland Dingwall states to hold levophed.

## 2016-07-13 NOTE — ED Notes (Signed)
Lactic Acid 2.75 given to Maryan Rued, MD

## 2016-07-13 NOTE — ED Notes (Signed)
Pt c/o small amount of vomiting. Will inform MD.

## 2016-07-13 NOTE — H&P (Signed)
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 He is currently undergoing chemotherapy and radiation to L femur.  He presented 8/3 with 1 day hx nausea, bilious vomiting and abd pain.  Had been having diarrhea the week before, significant with up to 9 or 10 episodes per day for at least 3 - 4 days.  Diarrhea felt to be due to new chemo meds which were started last week.  Pt was given anti-diarrheals which did eventually help control frequency but did not completely stop the diarrhea.  He has had decreased PO intake since symptoms first began. In ER he was hypotensive, initial lactate 4.7 improved to 2.7 after just 1.2L IVF.  His abd u/s showed ?acalculous cholecystitis.  He was seen by surgery and is awaiting HIDA scan.  Initially improved and IMTS was called to admit.  However despite improved lactate he continues to be hypotensive and PCCM called for consult.   Currently remains in ER, feeling better in terms of nausea and vomiting.  Only has abd pain if his abdomen is palpated, greatest RUQ but not severe, has not wanted pain meds.  Denies fevers/chills/sweats, SOB, chest pain, syncope, hematemesis, melena.  Last chemo session last week.  PAST MEDICAL HISTORY :  He  has a past medical history of Cancer ([place]); Chronic kidney disease; and Compression fracture.  PAST SURGICAL HISTORY: He  has a past surgical history that includes Mouth surgery; Femur IM nail (Left, 05/14/2016); Bone biopsy (Left, 05/14/2016); and AV fistula placement (Left, 05/24/2016).  Allergies  Allergen Reactions  . Penicillins Rash and Other (See Comments)    Has patient had a PCN reaction  causing immediate rash, facial/tongue/throat swelling, SOB or lightheadedness with hypotension: YES + Reaction causing SEVERE RASH involving MUCUS MEMBRANES or SKIN NECROSIS >> YES Reaction that required hospitalization: NO Reaction occurring within the last 10 years: NO If all of the above answers are "NO", then may proceed with Cephalosporin use.   . Dilaudid [Hydromorphone Hcl] Other (See Comments)    hallucinations  . Codeine Other (See Comments)    "MAKES ME JUMPY"    No current facility-administered medications on file prior to encounter.    Current Outpatient Prescriptions on File Prior to Encounter  Medication Sig  . acetaminophen (TYLENOL) 325 MG tablet Take 2 tablets (650 mg total) by mouth every 6 (six) hours as needed for mild pain (or Fever >/= 101).  Marland Kitchen acyclovir (ZOVIRAX) 200 MG capsule Take 1 capsule (200 mg total) by mouth 2 (two) times daily.  Marland Kitchen ALPRAZolam (XANAX) 0.25 MG tablet Take 1 tablet (0.25 mg total) by mouth 2 (two) times daily as needed for anxiety.  . calcium acetate (PHOSLO) 667 MG capsule Take 1 capsule (667 mg total) by mouth 2 (two) times daily with a meal.  . cyanocobalamin 1000 MCG tablet Take 1 tablet (1,000 mcg total) by mouth daily.  Marland Kitchen dexamethasone (DECADRON) 4 MG tablet Take 5 tablets (20 mg total) by mouth once a week.  . diphenoxylate-atropine (LOMOTIL) 2.5-0.025 MG tablet Take 1 tablet by mouth 4 (four) times daily as needed for diarrhea or loose stools.  Marland Kitchen lenalidomide (REVLIMID) 2.5  MG capsule Take 1 capsule daily for 14 days, then off 7 days  . loperamide (IMODIUM A-D) 2 MG tablet Take 2 mg by mouth 4 (four) times daily as needed for diarrhea or loose stools.  Marland Kitchen loratadine (CLARITIN) 10 MG tablet Take 10 mg by mouth daily as needed for allergies.   . multivitamin (RENA-VIT) TABS tablet Take 1 tablet by mouth at bedtime.  . ondansetron (ZOFRAN) 8 MG tablet Take 1 tablet (8 mg total) by mouth every 8 (eight) hours as needed for nausea.  .  pantoprazole (PROTONIX) 40 MG tablet Take 1 tablet (40 mg total) by mouth daily.  Marland Kitchen tobramycin (TOBREX) 0.3 % ophthalmic solution Place 1 drop into both eyes every 4 (four) hours.  . triamcinolone (NASACORT AQ) 55 MCG/ACT AERO nasal inhaler Place 2 sprays into the nose daily.  . Vitamin D, Ergocalciferol, (DRISDOL) 50000 units CAPS capsule Take 1 capsule (50,000 Units total) by mouth every 7 (seven) days.    FAMILY HISTORY:  His indicated that the status of his father is unknown.    SOCIAL HISTORY: He  reports that he has quit smoking. He has never used smokeless tobacco. He reports that he does not drink alcohol or use drugs.  REVIEW OF SYSTEMS:   As per HPI - All other systems reviewed and were neg.    SUBJECTIVE:  Feels quite a bit better.  Only has abd pain if people examine his abdomen.  VITAL SIGNS: BP (!) 82/52   Pulse 102   Temp 99.6 F (37.6 C) (Oral)   Resp (!) 27   SpO2 (!) 89%   HEMODYNAMICS:    VENTILATOR SETTINGS:    INTAKE / OUTPUT: No intake/output data recorded.  PHYSICAL EXAMINATION: General: Middle aged male, resting in bed, in NAD. Neuro: A&O x 3, non-focal.  HEENT: [place]/AT. PERRL, sclerae anicteric. Cardiovascular: RRR, no M/R/G.  Lungs: Respirations even and unlabored.  CTA bilaterally, No W/R/R.  Abdomen: BS x 4, soft. Moderately tender in RUQ without rebound.  Mild guarding. Musculoskeletal: No gross deformities, 1+ edema, L > R (chronic per pt). Skin: Intact, warm, no rashes.   LABS:  BMET  Recent Labs Lab 07/10/16 1019 07/13/16 0810  NA 134* 134*  K 3.6 4.2  CO2 27 23  BUN 15.0 28.6*  CREATININE 3.4* 3.3*  GLUCOSE 125 152*    Electrolytes  Recent Labs Lab 07/10/16 1019 07/13/16 0810  CALCIUM 8.5 8.4    CBC  Recent Labs Lab 07/10/16 1019 07/13/16 0810  WBC 6.3 13.6*  HGB 8.9* 8.6*  HCT 26.2* 26.6*  PLT 38* 86*    Coag's No results for input(s): APTT, INR in the last 168 hours.  Sepsis Markers  Recent  Labs Lab 07/13/16 0946 07/13/16 1500  LATICACIDVEN 4.74* 2.75*    ABG No results for input(s): PHART, PCO2ART, PO2ART in the last 168 hours.  Liver Enzymes  Recent Labs Lab 07/10/16 1019 07/13/16 0810  AST 34 67*  ALT 36 74*  ALKPHOS 102 251*  BILITOT 0.52 3.43*  ALBUMIN 2.5* 2.3*    Cardiac Enzymes No results for input(s): TROPONINI, PROBNP in the last 168 hours.  Glucose No results for input(s): GLUCAP in the last 168 hours.  Imaging US Abdomen Limited Ruq  Result Date: 07/13/2016 CLINICAL DATA:  Right upper quadrant pain for 2 days. Nausea and vomiting for 1 day. EXAM: US ABDOMEN LIMITED - RIGHT UPPER QUADRANT COMPARISON:  None. FINDINGS: Gallbladder: The gallbladder is mildly distended measuring 5.2 cm in transverse  dimension. This also mild gallbladder wall thickening and gallbladder sludge. No definite gallstones or pericholecystic fluid. Negative sonographic Murphy sign. Common bile duct: Diameter: 4.1 mm Liver: Normal echogenicity without focal lesion or biliary dilatation. Small amount of right upper quadrant ascites. Right pleural effusion noted. IMPRESSION: 1. Distended gallbladder with mild gallbladder wall thickening and gallbladder sludge. Could not exclude acalculous cholecystitis. 2. Normal caliber common bile duct. 3. Normal liver. 4. Small amount of right upper quadrant ascites and right pleural effusion. Electronically Signed   By: Marijo Sanes M.D.   On: 07/13/2016 12:05     STUDIES:  abd u/s 8/3>>> Distended gallbladder with mild gallbladder wall thickening and gallbladder sludge. Could not exclude acalculous cholecystitis.  Normal caliber common bile duct.  Small amount of right upper quadrant ascites and right pleural effusion.  CULTURES: BC x 2 8/3>>>   ANTIBIOTICS: Flagyl 8/3>>> vanc 8/3>>> Diflucan 8/3>>> Levaquin 8/3>>> Aztreonam 8/3>>>  SIGNIFICANT EVENTS: 8/3 > admit.  LINES/TUBES: R IJ Perm cath 6/1>>>  DISCUSSION: 70 y.o. M with  recent diagnosis of MM (end of April 2017), currently undergoing chemo and radiation.  Started on new chemo drug last week (7/31) and developed significant diarrhea afterwards.  Had symptoms for 3 - 4 days then on 8/3 began to have N/V and abd pain.  Found to have possible acalculous cholecystitis.  PCCM asked to admit to ICU due to hypotension.  ASSESSMENT / PLAN:  CARDIOVASCULAR Hypotension.  Sepsis - suspect gallbladder as source. Of note, pt is immunocompromised.  P:  Ensure 30cc/kg bolus done. Goal MAP > 65. Repeat lactate. Assess cortisol. Continue gentle volume.   INFECTIOUS A:   Sepsis - suspect gallbladder as source. Of note, pt is immunocompromised.  P:   Continue empiric broad spectrum abx as above (immunocompromised host). Follow cultures.  GASTROINTESTINAL A:   Transaminitis - suspect due to GB disease. P:   LFT's in AM. F/u on HIDA scan. General surgery following, appreciate the assistance.  RENAL A:   ESRD - on dialysis T/Th/Sat. AGMA - lactate. P:   NS @ 50. Will notify nephrology of admission. BMP in AM.  HEMATOLOGIC / ONCOLOGIC A:   Recent diagnosis of MM (end of April 2017) - on chemo / radiation. Anemia - chronic. Thrombocytopenia. VTE prophylaxis. P:  Day team to please notify oncology of admission. Transfuse for Hgb < 7. Monitor platelet counts. SCD's only.  PULMONARY R pleural effusion. P:   Supplemental O2 as needed to keep sats >92%.  F/u CXR.   ENDOCRINE A:   No acute issues. P:   No interventions required.  NEUROLOGIC A:   No acute issues. P:   No interventions required.   FAMILY  Updates: Wife and daughter updated.  Inter-disciplinary family meet or Palliative Care meeting due by:  07/19/16.   CC time:  35 minutes.  STAFF NOTE: I, Merrie Roof, MD FACP have personally reviewed patient's available data, including medical history, events of note, physical examination and test results as part of my evaluation. I  have discussed with resident/NP and other care providers such as pharmacist, RN and RRT. In addition, I personally evaluated patient and elicited key findings of: awake, mentation normal, MAP 62, RUQ tenderness, no rebound / guard, soft overall, clijical picture of sepsis / hypovolemia, Alctic was repeated and trending down, CT reviewed, concern cholecystitis, line is clean rt neck, he is immunocompromised host, administer 30 cc/kg to total about 2.3 liters, needs further then what has been given in ED, I  performed a repeat assessment at 4 pm, ensure [facility] sent, get HIDA, gen surgery evaluated pt and following, admit to icu, likely will need levophed to map goal, will call renal to see in am , no role HD at this time, would prefer to use catheter short term in hopes to avoid central line, may need new acccess, get cortisol, wold prefer double coverage gram neg insetting pcn allergy, and flagyl for gallbladder anearobes, add vanc for and consider empiric antifungals for now,  I update ot and family in room, likley will need cvp The patient is critically ill with multiple organ systems failure and requires high complexity decision making for assessment and support, frequent evaluation and titration of therapies, application of advanced monitoring technologies and extensive interpretation of multiple databases.   Critical Care Time devoted to patient care services described in this note is45 Minutes. This time reflects time of care of this signee: Merrie Roof, MD FACP. This critical care time does not reflect procedure time, or teaching time or supervisory time of PA/NP/Med student/Med Resident etc but could involve care discussion time. Rest per NP/medical resident whose note is outlined above and that I agree with   Lavon Paganini. Titus Mould, MD, FACP Pgr: [facility] 07/13/2016 10:03 PM   Montey Hora, PA - [facility] Pager: [phone]   or [phone] 07/13/2016, 5:35 PM

## 2016-07-13 NOTE — ED Notes (Signed)
Pt sleeping but wakes on entering room.

## 2016-07-13 NOTE — Assessment & Plan Note (Signed)
I review his recent test results with him and family. Serum light chain has dropped significantly to near normal ratio Due to recent pancytopenia, treatment is placed on hold I will see him back in 2 weeks to resume treatment if pancytopenia resolved

## 2016-07-13 NOTE — Assessment & Plan Note (Signed)
This is new onset. Clinically, there is signs of dehydration. With acute elevated LFT, I suspect he may have acute cholecystitis. I recommend continue anti-emetics but directed him to the emergency department

## 2016-07-13 NOTE — ED Notes (Signed)
Manual bp 86/46

## 2016-07-13 NOTE — ED Provider Notes (Addendum)
[facility]-EMERGENCY DEPT Provider Note   CSN: [id number] Arrival date & time: 07/13/16  0917  First Provider Contact:  None       History   Chief Complaint No chief complaint on file.   HPI Thomas [name] is a 70 y.o. male.   Abdominal Pain   This is a new problem. The current episode started yesterday. The problem occurs constantly. The problem has been gradually worsening. Associated with: started spontaneously. The pain is located in the RUQ. The pain is at a severity of 9/10. The pain is severe (pain is intermittent and currently not having pain). Associated symptoms include anorexia, nausea and vomiting. Pertinent negatives include fever, diarrhea and dysuria. The symptoms are aggravated by eating. Nothing relieves the symptoms. Past medical history comments: hx of MM on dialysis and currently getting treatment.    Past Medical History:  Diagnosis Date  . Cancer (HCC)   . Chronic kidney disease   . Compression fracture     Patient Active Problem List   Diagnosis Date Noted  . 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 Diarrhea 07/04/2016  . Sinusitis nasal 07/03/2016  . Conjunctivitis of both eyes 07/03/2016  . ESRD (end stage renal disease) on dialysis (HCC) 06/12/2016  . Protein-calorie malnutrition, moderate (HCC) 06/12/2016  . Constipation   . Myeloma cast nephropathy (HCC)   . Physical deconditioning   . Closed fracture of shaft of left femur (HCC)   . Adjustment disorder with mixed anxiety and depressed mood   . Postoperative pain   . Bilateral low back pain without sciatica   . Pressure ulcer   . Thrombocytopenia (HCC)   . Arterial hypotension   . Functional diarrhea   . Anemia of chronic disease   . Multiple myeloma not having achieved remission (HCC)   . Antineoplastic chemotherapy induced pancytopenia (HCC)   . Low back pain   . Post-op pain   . Surgery, elective   . Acute blood loss anemia   . ESRD on dialysis (HCC)   . Essential  hypertension   . Urinary tract infection, site not specified   . Vitamin B12 deficiency   . Multiple myeloma (HCC) 05/14/2016  . Acute renal failure (HCC)   . Pathological fracture of left femur due to neoplastic disease (HCC)   . Anemia due to bone marrow failure (HCC)   . 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 Occult blood positive stool 05/10/2016  . Malnutrition of moderate degree 05/10/2016    Past Surgical History:  Procedure Laterality Date  . AV FISTULA PLACEMENT Left 05/24/2016   Procedure: ARTERIOVENOUS (AV) FISTULA CREATION-LEFT;  Surgeon: Fransisco Hertz, MD;  Location: [facility] OR;  Service: Vascular;  Laterality: Left;  . BONE BIOPSY Left 05/14/2016   Procedure: LEFT FEMORAL BIOPSY WITH INTRAOPERATIVE FROZEN SECTIONS ;  Surgeon: Samson Frederic, MD;  Location: [facility] OR;  Service: Orthopedics;  Laterality: Left;  . FEMUR IM NAIL Left 05/14/2016   Procedure: INTRAMEDULLARY (IM) NAIL FEMORAL;  Surgeon: Samson Frederic, MD;  Location: [facility] OR;  Service: Orthopedics;  Laterality: Left;  . MOUTH SURGERY         Home Medications    Prior to Admission medications   Medication Sig Start Date End Date Taking? Authorizing Provider  acetaminophen (TYLENOL) 325 MG tablet Take 2 tablets (650 mg total) by mouth every 6 (six) hours as needed for mild pain (or Fever >/= 101). 05/29/16   Nishant Dhungel, MD  acyclovir (ZOVIRAX) 200  MG capsule Take 1 capsule (200 mg total) by mouth 2 (two) times daily. 07/03/16   Heath Lark, MD  ALPRAZolam (XANAX) 0.25 MG tablet Take 1 tablet (0.25 mg total) by mouth 2 (two) times daily as needed for anxiety. Patient not taking: Reported on 07/13/2016 06/08/16   Lavon Paganini Angiulli, PA-C  calcium acetate (PHOSLO) 667 MG capsule Take 1 capsule (667 mg total) by mouth 2 (two) times daily with a meal. 06/08/16   Lavon Paganini Angiulli, PA-C  cyanocobalamin 1000 MCG tablet Take 1 tablet (1,000 mcg total) by mouth daily. 06/08/16   Lavon Paganini Angiulli, PA-C  dexamethasone (DECADRON) 4 MG  tablet Take 5 tablets (20 mg total) by mouth once a week. 07/03/16   Heath Lark, MD  diphenoxylate-atropine (LOMOTIL) 2.5-0.025 MG tablet Take 1 tablet by mouth 4 (four) times daily as needed for diarrhea or loose stools. 07/07/16   Heath Lark, MD  lenalidomide (REVLIMID) 2.5 MG capsule Take 1 capsule daily for 14 days, then off 7 days Patient not taking: Reported on 07/13/2016 06/12/16   Heath Lark, MD  loperamide (IMODIUM A-D) 2 MG tablet Take 2 mg by mouth 4 (four) times daily as needed for diarrhea or loose stools.    Historical Provider, MD  loratadine (CLARITIN) 10 MG tablet Take 10 mg by mouth daily.    Historical Provider, MD  multivitamin (RENA-VIT) TABS tablet Take 1 tablet by mouth at bedtime. 06/08/16   Lavon Paganini Angiulli, PA-C  ondansetron (ZOFRAN) 8 MG tablet Take 1 tablet (8 mg total) by mouth every 8 (eight) hours as needed for nausea. 07/03/16   Heath Lark, MD  pantoprazole (PROTONIX) 40 MG tablet Take 1 tablet (40 mg total) by mouth daily. 06/08/16   Lavon Paganini Angiulli, PA-C  tobramycin (TOBREX) 0.3 % ophthalmic solution Place 1 drop into both eyes every 4 (four) hours. 07/03/16   Heath Lark, MD  triamcinolone (NASACORT AQ) 55 MCG/ACT AERO nasal inhaler Place 2 sprays into the nose daily. 07/03/16   Heath Lark, MD  Vitamin D, Ergocalciferol, (DRISDOL) 50000 units CAPS capsule Take 1 capsule (50,000 Units total) by mouth every 7 (seven) days. 06/08/16   Lavon Paganini Angiulli, PA-C    Family History Family History  Problem Relation Age of Onset  . Diabetes Father     Social History Social History  Substance Use Topics  . Smoking status: Former Research scientist (life sciences)  . Smokeless tobacco: Never Used  . Alcohol use No     Allergies   Penicillins; Dilaudid [hydromorphone hcl]; and Codeine   Review of Systems Review of Systems  Constitutional: Negative for fever.  Gastrointestinal: Positive for abdominal pain, anorexia, nausea and vomiting. Negative for diarrhea.  Genitourinary: Negative for dysuria.    All other systems reviewed and are negative.    Physical Exam Updated Vital Signs There were no vitals taken for this visit.  Physical Exam  Constitutional: He is oriented to person, place, and time. He appears well-developed and well-nourished. No distress.  Ill appearing  HENT:  Head: Normocephalic and atraumatic.  Mouth/Throat: Oropharynx is clear and moist. Mucous membranes are dry.  Eyes: Conjunctivae and EOM are normal. Pupils are equal, round, and reactive to light.  Neck: Normal range of motion. Neck supple.  Cardiovascular: Normal rate, regular rhythm and intact distal pulses.   No murmur heard. Pulmonary/Chest: Effort normal and breath sounds normal. No respiratory distress. He has no wheezes. He has no rales.  Abdominal: Soft. He exhibits distension. There is tenderness in the right  upper quadrant and epigastric area. There is no rebound and no guarding.  Musculoskeletal: Normal range of motion. He exhibits no edema or tenderness.  Neurological: He is alert and oriented to person, place, and time.  Skin: Skin is warm and dry. No rash noted. No erythema.  Slightly jaundiced  Psychiatric: He has a normal mood and affect. His behavior is normal.  Nursing note and vitals reviewed.    ED Treatments / Results  Labs (all labs ordered are listed, but only abnormal results are displayed) Labs Reviewed  I-STAT CG4 LACTIC ACID, ED - Abnormal; Notable for the following:       Result Value   Lactic Acid, Venous 4.74 (*)    All other components within normal limits  LIPASE, BLOOD    EKG  EKG Interpretation None       Radiology US Abdomen Limited Ruq  Result Date: 07/13/2016 CLINICAL DATA:  Right upper quadrant pain for 2 days. Nausea and vomiting for 1 day. EXAM: US ABDOMEN LIMITED - RIGHT UPPER QUADRANT COMPARISON:  None. FINDINGS: Gallbladder: The gallbladder is mildly distended measuring 5.2 cm in transverse dimension. This also mild gallbladder wall thickening and  gallbladder sludge. No definite gallstones or pericholecystic fluid. Negative sonographic Murphy sign. Common bile duct: Diameter: 4.1 mm Liver: Normal echogenicity without focal lesion or biliary dilatation. Small amount of right upper quadrant ascites. Right pleural effusion noted. IMPRESSION: 1. Distended gallbladder with mild gallbladder wall thickening and gallbladder sludge. Could not exclude acalculous cholecystitis. 2. Normal caliber common bile duct. 3. Normal liver. 4. Small amount of right upper quadrant ascites and right pleural effusion. Electronically Signed   By: Marijo Sanes M.D.   On: 07/13/2016 12:05    Procedures Procedures (including critical care time)  Medications Ordered in ED Medications  ondansetron (ZOFRAN) injection 4 mg (not administered)  sodium chloride 0.9 % bolus 500 mL (not administered)     Initial Impression / Assessment and Plan / ED Course  I have reviewed the triage vital signs and the nursing notes.  Pertinent labs & imaging results that were available during my care of the patient were reviewed by me and considered in my medical decision making (see chart for details).  Clinical Course   Pt being sent into Dr. Philis Nettle office today due to N/V and RUQ pain.  Pt has new leukocytosis of 13,000 and new LFT elevation in the 50's and 60's.  Concern for possible cholecystitis.  Patient's hemoglobin is at baseline and he has history of chronic renal insufficiency but creatinine today is unchanged at 3.3. Patient has persistent thrombocytopenia and platelets today are 87.  Patient given IV fluids, pain and nausea control. Lipase, lactate and right upper quadrant ultrasound pending  9:53 AM Patient given gentle IV fluids. He does not have signs of fluid overload at this time but is due for dialysis today. Patient's lactate is significant for 4.74.  Lipase is within normal limits. If patient's right upper quadrant ultrasound is within normal limits we'll do a  CT to rule out obstruction, perforation or appendicitis.  Ultrasound with concern for potential acalculous cholecystitis however patient evaluated by surgery and they went to perform a HIDA scan to further evaluate. Patient will be admitted to medicine service for medical management.   4:18 PM Patient has had slowly trending blood pressure is now down into the 70s despite fluid boluses. Lactic acid has improved from 4.7-2.75. Patient still awake and alert however at this point feel that he needs pressors.  We'll discuss with critical care. He was started on Levophed  CRITICAL CARE Performed by: Blanchie Dessert Total critical care time: 30 minutes Critical care time was exclusive of separately billable procedures and treating other patients. Critical care was necessary to treat or prevent imminent or life-threatening deterioration. Critical care was time spent personally by me on the following activities: development of treatment plan with patient and/or surrogate as well as nursing, discussions with consultants, evaluation of patient's response to treatment, examination of patient, obtaining history from patient or surrogate, ordering and performing treatments and interventions, ordering and review of laboratory studies, ordering and review of radiographic studies, pulse oximetry and re-evaluation of patient's condition.  Final Clinical Impressions(s) / ED Diagnoses   Final diagnoses:  Pain  RUQ pain  Sepsis, due to unspecified organism ([place])  Hypotension, unspecified hypotension type    New Prescriptions New Prescriptions   No medications on file     Blanchie Dessert, MD 07/13/16 1413    Blanchie Dessert, MD 07/13/16 1620

## 2016-07-14 ENCOUNTER — Ambulatory Visit: Payer: PPO

## 2016-07-14 ENCOUNTER — Inpatient Hospital Stay (HOSPITAL_COMMUNITY): Payer: PPO

## 2016-07-14 DIAGNOSIS — D696 Thrombocytopenia, unspecified: Secondary | ICD-10-CM

## 2016-07-14 DIAGNOSIS — D61818 Other pancytopenia: Secondary | ICD-10-CM

## 2016-07-14 DIAGNOSIS — C9000 Multiple myeloma not having achieved remission: Secondary | ICD-10-CM

## 2016-07-14 DIAGNOSIS — J9601 Acute respiratory failure with hypoxia: Secondary | ICD-10-CM

## 2016-07-14 DIAGNOSIS — R6521 Severe sepsis with septic shock: Secondary | ICD-10-CM

## 2016-07-14 DIAGNOSIS — E860 Dehydration: Secondary | ICD-10-CM

## 2016-07-14 DIAGNOSIS — A419 Sepsis, unspecified organism: Secondary | ICD-10-CM

## 2016-07-14 DIAGNOSIS — R748 Abnormal levels of other serum enzymes: Secondary | ICD-10-CM

## 2016-07-14 DIAGNOSIS — D72829 Elevated white blood cell count, unspecified: Secondary | ICD-10-CM

## 2016-07-14 LAB — HEPATIC FUNCTION PANEL
ALT: 51 U/L (ref 17–63)
AST: 44 U/L — AB (ref 15–41)
Albumin: 1.9 g/dL — ABNORMAL LOW (ref 3.5–5.0)
Alkaline Phosphatase: 226 U/L — ABNORMAL HIGH (ref 38–126)
BILIRUBIN DIRECT: 3.8 mg/dL — AB (ref 0.1–0.5)
BILIRUBIN INDIRECT: 1.2 mg/dL — AB (ref 0.3–0.9)
BILIRUBIN TOTAL: 5 mg/dL — AB (ref 0.3–1.2)
Total Protein: 5.3 g/dL — ABNORMAL LOW (ref 6.5–8.1)

## 2016-07-14 LAB — CBC
HCT: 22.1 % — ABNORMAL LOW (ref 39.0–52.0)
HEMOGLOBIN: 7.5 g/dL — AB (ref 13.0–17.0)
MCH: 30.5 pg (ref 26.0–34.0)
MCHC: 33.9 g/dL (ref 30.0–36.0)
MCV: 89.8 fL (ref 78.0–100.0)
PLATELETS: 50 10*3/uL — AB (ref 150–400)
RBC: 2.46 MIL/uL — ABNORMAL LOW (ref 4.22–5.81)
RDW: 16 % — ABNORMAL HIGH (ref 11.5–15.5)
WBC: 48.7 10*3/uL — ABNORMAL HIGH (ref 4.0–10.5)

## 2016-07-14 LAB — BASIC METABOLIC PANEL
Anion gap: 12 (ref 5–15)
BUN: 35 mg/dL — AB (ref 6–20)
CALCIUM: 7.2 mg/dL — AB (ref 8.9–10.3)
CHLORIDE: 99 mmol/L — AB (ref 101–111)
CO2: 21 mmol/L — ABNORMAL LOW (ref 22–32)
CREATININE: 4.08 mg/dL — AB (ref 0.61–1.24)
GFR, EST AFRICAN AMERICAN: 16 mL/min — AB (ref >60)
GFR, EST NON AFRICAN AMERICAN: 14 mL/min — AB (ref >60)
Glucose, Bld: 198 mg/dL — ABNORMAL HIGH (ref 65–99)
Potassium: 3.8 mmol/L (ref 3.5–5.1)
SODIUM: 132 mmol/L — AB (ref 135–145)

## 2016-07-14 LAB — PHOSPHORUS: PHOSPHORUS: 3.8 mg/dL (ref 2.5–4.6)

## 2016-07-14 LAB — MAGNESIUM: MAGNESIUM: 1.2 mg/dL — AB (ref 1.7–2.4)

## 2016-07-14 MED ORDER — TECHNETIUM TC 99M MEBROFENIN IV KIT: 5.0000 | PACK | Freq: Once | INTRAVENOUS | Status: DC | PRN

## 2016-07-14 MED ORDER — VANCOMYCIN HCL IN DEXTROSE 750-5 MG/150ML-% IV SOLN
750.0000 mg | INTRAVENOUS | Status: DC
  Filled 2016-07-14 (×2): qty 150

## 2016-07-14 MED ORDER — DEXTROSE 5 % IV SOLN
1.0000 g | INTRAVENOUS | Status: DC
  Administered 2016-07-15 – 2016-07-17 (×3): 1 g via INTRAVENOUS
  Filled 2016-07-14 (×4): qty 1

## 2016-07-14 MED ORDER — SODIUM CHLORIDE 0.9 % IV BOLUS (SEPSIS)
500.0000 mL | Freq: Once | INTRAVENOUS | Status: AC
  Administered 2016-07-14: 500 mL via INTRAVENOUS

## 2016-07-14 MED ORDER — MORPHINE SULFATE (PF) 4 MG/ML IV SOLN
3.0000 mg | Freq: Once | INTRAVENOUS | Status: AC
  Administered 2016-07-14: 3 mg via INTRAVENOUS
  Filled 2016-07-14: qty 1

## 2016-07-14 MED ORDER — MIDODRINE HCL 5 MG PO TABS
5.0000 mg | ORAL_TABLET | Freq: Three times a day (TID) | ORAL | Status: DC
  Administered 2016-07-14 – 2016-07-20 (×16): 5 mg via ORAL
  Filled 2016-07-14 (×17): qty 1

## 2016-07-14 MED ORDER — MAGNESIUM SULFATE 4 GM/100ML IV SOLN
4.0000 g | Freq: Once | INTRAVENOUS | Status: AC
  Administered 2016-07-14: 4 g via INTRAVENOUS
  Filled 2016-07-14: qty 100

## 2016-07-14 NOTE — Progress Notes (Signed)
Subjective:  Seems like pressors are being weaned some- no UOP- refuses foley- says he had been having good UOP prior to hosp.  He denies abd pain this AM- wants to eat  Objective Vital signs in last 24 hours: Vitals:   07/14/16 0700 07/14/16 0730 07/14/16 0753 07/14/16 0800  BP: 121/70 125/74  101/63  Pulse: 93 95  90  Resp: 18 (!) 24  17  Temp:   98 F (36.7 C)   TempSrc:   Oral   SpO2: 100% 99%  100%  Weight:       Weight change:   Intake/Output Summary (Last 24 hours) at 07/14/16 0810 Last data filed at 07/14/16 0700  Gross per 24 hour  Intake          2199.38 ml  Output                0 ml  Net          2199.38 ml   Dialysis prescription: Tuesday/Thursday/Saturday at [facility] 4 hours, 180 dialyzer, BFR 400, DFR 800, EDW 73 kg, 3K/2.25 calcium, no UF profile, no sodium modeling. Aranesp 40 g weekly, heparin bolus 73,000 units, tunneled RIJ dialysis catheter with maturing left BCF.  Assessment/Plan: 1. Abdominal pain/nausea/vomiting: Possible acalculous cholecystitis-seen earlier by surgery HIDA scan not revealing- surg now rec GI consult for further management.  WBC 48K!! On broad spectrum abx 2. End-stage renal disease: Secondary to multiple myeloma and getting hemodialysis via right IJ tunneled dialysis catheter- last done on Tuesday. No acute indications for dialysis right now still. Thought was that maybe he was recovering some renal function with treatment of myeloma but now with no uop I am afraid he likely suffered another kidney injury and BUN/crt did go up from yest. He has pitting dependent edema-likely third spacing from low albumin- will cont to follow daily for HD needs.   3. 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 Probably will need transfusion 4. 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 Appears that he is not on VDRA.    Thomas [name]    Labs: Basic Metabolic Panel:  Recent Labs Lab 07/10/16 1019 07/13/16 0810 07/14/16 0300  NA 134* 134* 132*  K 3.6 4.2 3.8  CL  --   --  99*  CO2 27 23 21*  GLUCOSE 125 152* 198*  BUN 15.0 28.6* 35*  CREATININE 3.4* 3.3* 4.08*  CALCIUM 8.5 8.4 7.2*  PHOS  --   --  3.8   Liver Function Tests:  Recent Labs Lab 07/10/16 1019 07/13/16 0810 07/14/16 0300  AST 34 67* 44*  ALT 36 74* 51  ALKPHOS 102 251* 226*  BILITOT 0.52 3.43* 5.0*  PROT 6.6 6.4 5.3*  ALBUMIN 2.5* 2.3* 1.9*    Recent Labs Lab 07/13/16 0932  LIPASE 15   No results for input(s): AMMONIA in the last 168 hours. CBC:  Recent Labs Lab 07/10/16 1019 07/13/16 0810 07/14/16 0300  WBC 6.3 13.6* 48.7*  NEUTROABS 5.4 12.6*  --   HGB 8.9* 8.6* 7.5*  HCT 26.2* 26.6* 22.1*  MCV 89.7 92.1 89.8  PLT 38* 86* 50*   Cardiac Enzymes: No results for input(s): CKTOTAL, CKMB, CKMBINDEX, TROPONINI in the last 168 hours. CBG: No results for input(s): GLUCAP in the last 168 hours.  Iron Studies: No results for  input(s): IRON, TIBC, TRANSFERRIN, FERRITIN in the last 72 hours. Studies/Results: Nm Hepatobiliary Liver Func  Result Date: 07/14/2016 CLINICAL DATA:  Right upper quadrant pain. Abnormal sonographic appearance of the gallbladder. EXAM: NUCLEAR MEDICINE HEPATOBILIARY IMAGING TECHNIQUE: Sequential images of the abdomen were obtained out to 120 minutes following intravenous administration of radiopharmaceutical. After IV administered of 3 mg morphine, imaging obtained for an additional 30 minutes. RADIOPHARMACEUTICALS:  5.2  mCi Tc-49m Choletec IV COMPARISON:  Right upper quadrant ultrasound yesterday. FINDINGS: Imaging obtained for 2 hours and 30 minutes. There is mildly delayed hepatic uptake. There is no visualized tracer accumulation within the intrahepatic ducts. At 35  minutes tracer is visualized in the midline, which may be duodenum or common bile duct. There is midline tracer increases in intensity with to and fro trace accumulation proximal and distally likely refluxed into the stomach and passing distally proximal small bowel. After 120 minutes, no gallbladder activity. As small bowel activity was visualized, 3 mg of morphine was administered. Increased GI tract activity, no gallbladder visualization after morphine. IMPRESSION: Gallbladder not visualized after 2.5 hours as well as the administration of morphine. In addition however the intra and extrahepatic bile ducts are not well seen. Nonvisualization of the gallbladder may be secondary to cholecystitis versus underlying hepatocellular dysfunction (patient bilirubin greater than 3). Limited visualization of the biliary tree activity is likely secondary to hepatic cellular dysfunction. Biliary obstruction is felt much less likely as GI tract activity is observed. Electronically Signed   By: MJeb LeveringM.D.   On: 07/14/2016 02:59   Dg Chest Port 1 View  Result Date: 07/13/2016 CLINICAL DATA:  Effusion. Abnormal labs.Hx of CKD, dialysis patient.Ex-smoker, quit ~30 years ago. EXAM: PORTABLE CHEST 1 VIEW COMPARISON:  05/24/2016 FINDINGS: Cardiac silhouette is normal in size. No mediastinal or hilar masses or evidence of adenopathy. There is vascular congestion. Left greater than right pleural effusions are noted. There is some hazy opacity in the lung bases, likely due to atelectasis. No pneumothorax. Right internal jugular dual-lumen central venous catheter is stable and well positioned. IMPRESSION: 1. Small, left greater than right, pleural effusions with associated lung base opacity, most likely atelectasis. 2. Central vascular congestion without overt pulmonary edema. Electronically Signed   By: DLajean ManesM.D.   On: 07/13/2016 17:27   [place]domen Limited Ruq  Result Date: 07/13/2016 CLINICAL DATA:  Right  upper quadrant pain for 2 days. Nausea and vomiting for 1 day. EXAM: [place]DOM[place] LIMITED - RIGHT UPPER QUADRANT COMPARISON:  None. FINDINGS: Gallbladder: The gallbladder is mildly distended measuring 5.2 cm in transverse dimension. This also mild gallbladder wall thickening and gallbladder sludge. No definite gallstones or pericholecystic fluid. Negative sonographic Murphy sign. Common bile duct: Diameter: 4.1 mm Liver: Normal echogenicity without focal lesion or biliary dilatation. Small amount of right upper quadrant ascites. Right pleural effusion noted. IMPRESSION: 1. Distended gallbladder with mild gallbladder wall thickening and gallbladder sludge. Could not exclude acalculous cholecystitis. 2. Normal caliber common bile duct. 3. Normal liver. 4. Small amount of right upper quadrant ascites and right pleural effusion. Electronically Signed   By: PMarijo SanesM.D.   On: 07/13/2016 12:05   Medications: Infusions: . phenylephrine (NEO-SYNEPHRINE) Adult infusion 40 mcg/min (07/14/16 0801)    Scheduled Medications: . aztreonam  1 g Intravenous Q8H  . famotidine (PEPCID) IV  20 mg Intravenous Q24H  . fluconazole (DIFLUCAN) IV  200 mg Intravenous Q24H  . levofloxacin (LEVAQUIN) IV  500 mg Intravenous Q48H  . metronidazole  500 mg Intravenous Q8H  . norepinephrine (LEVOPHED) Adult infusion  0-40 mcg/min Intravenous Once  . vancomycin  750 mg Intravenous Q24H    have reviewed scheduled and prn medications.  Physical Exam: General: alert, NAD Heart: RRR Lungs: mostly clear Abdomen: soft, non tender Extremities: pitting dependent edema Dialysis Access: PC and left AVF  - placed 6/14   07/14/2016,8:10 AM  LOS: 1 day

## 2016-07-14 NOTE — Progress Notes (Signed)
If it is strongly believed that the patient is septic from a gallbladder acute acalculous cholecystitis her needs a percutaneous drain and not a cholecystectomy at this time.  Thomas [name]. [name], MD, [place] [phone] [facility]

## 2016-07-14 NOTE — Progress Notes (Signed)
Pharmacy Antibiotic Note Thomas [name] is a 70 y.o. male with multiple myeloma admitted on 07/13/2016 with sepsis w/ gallbladder as suspected source.  Patient is afebrile today with WBC of 48.7. Pharmacy has been consulted for aztreonam, metronidazole and vancomycin dosing.   Patient has ESRD on HD Tues, Thurs, Sat. He did not have HD yesterday, and renal is following. Will adjust antibiotic doses to reflect current HD schedule.    Plan: 1. Aztreonam 1g IV q24h at 1800. Dose should follow HD on HD days.  2. Metronidazole 500 mg IV every 8 hours.  3. Vancomycin 774m IV following TTS following HD sessions. 4. Monitor C/S and HD schedule. 5. VR level as indicated.   Weight: 161 lb 13.1 oz (73.4 kg)  Temp (24hrs), Avg:98.5 F (36.9 C), Min:98 F (36.7 C), Max:99.6 F (37.6 C)   Recent Labs Lab 07/10/16 1019 07/10/16 1019 07/13/16 0810 07/13/16 0810 07/13/16 0946 07/13/16 1500 07/13/16 1850 07/13/16 2240 07/14/16 0300  WBC  --  6.3 13.6*  --   --   --   --   --  48.7*  CREATININE 3.4*  --   --  3.3*  --   --   --   --  4.08*  LATICACIDVEN  --   --   --   --  4.74* 2.75* 3.4* 3.2*  --     Estimated Creatinine Clearance: 18 mL/min (by C-G formula based on SCr of 4.08 mg/dL).    Allergies  Allergen Reactions  . Penicillins Rash and Other (See Comments)    Has patient had a PCN reaction causing immediate rash, facial/tongue/throat swelling, SOB or lightheadedness with hypotension: YES + Reaction causing SEVERE RASH involving MUCUS MEMBRANES or SKIN NECROSIS >> YES Reaction that required hospitalization: NO Reaction occurring within the last 10 years: NO If all of the above answers are "NO", then may proceed with Cephalosporin use.   . Dilaudid [Hydromorphone Hcl] Other (See Comments)    hallucinations  . Codeine Other (See Comments)    "MAKES ME JUMPY"    Antimicrobials this admission: 8/3 Azactam >> 8/3 Flagyl >> 8/3 Vancomycin >> 8/3 Fluconazole >> 8/3 8/4 Levaquin >>  8/3 8/3 Cipro x 1  Dose adjustments this admission: Vancomycin and aztreonam doses reduced to reflect HD schedule.   Microbiology results: 8/3 BCx: pending 8/3 UCx: pending  8/3 MRSA PCR: neg  Thank you for allowing pharmacy to be a part of this patient's care.  MDemetrius Charity PharmD Acute Care Pharmacy Resident  Pager: [phone]/03/2016    I agree with the assessment and plan.  MHarvel Quale 07/14/2016 12:40 PM

## 2016-07-14 NOTE — Care Management Important Message (Signed)
Important Message  Patient Details  Name: Thomas [name] MRN: [id number] Date of Birth: Jan 21, 1946   Medicare Important Message Given:  Yes    Nathen May 07/14/2016, 10:41 AM

## 2016-07-14 NOTE — Progress Notes (Signed)
Thomas [name]   DOB:10-Apr-1946   EQ#:[id number]    Subjective: The patient is seen in the ICU. He was admitted to the ICU due to persistent hypotension despite IV fluids with clinical signs of sepsis. Ultrasound right upper quadrant and HIDA scan was inconclusive for acute cholecystitis. This morning, he denies abdominal pain. No nausea or diarrhea. He feels well. Denies chills or fever. The patient denies any recent signs or symptoms of bleeding such as spontaneous epistaxis, hematuria or hematochezia.   Objective:  Vitals:   07/14/16 1000 07/14/16 1015  BP: (!) 88/62 (!) 79/59  Pulse: 91 90  Resp: 15 (!) 23  Temp:       Intake/Output Summary (Last 24 hours) at 07/14/16 1043 Last data filed at 07/14/16 1000  Gross per 24 hour  Intake          2432.38 ml  Output                0 ml  Net          2432.38 ml    GENERAL:alert, no distress and comfortable SKIN: skin color is jaundiced, texture, turgor are normal, no rashes or significant lesions EYES: normal, Conjunctiva are jaundiced OROPHARYNX:no exudate, no erythema and lips, buccal mucosa, and tongue normal  NECK: supple, thyroid normal size, non-tender, without nodularity LYMPH:  no palpable lymphadenopathy in the cervical, axillary or inguinal LUNGS: clear to auscultation and percussion with normal breathing effort HEART: regular rate & rhythm and no murmurs and no lower extremity edema ABDOMEN:abdomen soft, non-tender and normal bowel sounds Musculoskeletal:no cyanosis of digits and no clubbing  NEURO: alert & oriented x 3 with fluent speech, no focal motor/sensory deficits   Labs:  Lab Results  Component Value Date   WBC 48.7 (H) 07/14/2016   HGB 7.5 (L) 07/14/2016   HCT 22.1 (L) 07/14/2016   MCV 89.8 07/14/2016   PLT 50 (L) 07/14/2016   NEUTROABS 12.6 (H) 07/13/2016    Lab Results  Component Value Date   NA 132 (L) 07/14/2016   K 3.8 07/14/2016   CL 99 (L) 07/14/2016   CO2 21 (L) 07/14/2016    Studies:  Nm  Hepatobiliary Liver Func  Result Date: 07/14/2016 CLINICAL DATA:  Right upper quadrant pain. Abnormal sonographic appearance of the gallbladder. EXAM: NUCLEAR MEDICINE HEPATOBILIARY IMAGING TECHNIQUE: Sequential images of the abdomen were obtained out to 120 minutes following intravenous administration of radiopharmaceutical. After IV administered of 3 mg morphine, imaging obtained for an additional 30 minutes. RADIOPHARMACEUTICALS:  5.2  mCi Tc-63m Choletec IV COMPARISON:  Right upper quadrant ultrasound yesterday. FINDINGS: Imaging obtained for 2 hours and 30 minutes. There is mildly delayed hepatic uptake. There is no visualized tracer accumulation within the intrahepatic ducts. At 35 minutes tracer is visualized in the midline, which may be duodenum or common bile duct. There is midline tracer increases in intensity with to and fro trace accumulation proximal and distally likely refluxed into the stomach and passing distally proximal small bowel. After 120 minutes, no gallbladder activity. As small bowel activity was visualized, 3 mg of morphine was administered. Increased GI tract activity, no gallbladder visualization after morphine. IMPRESSION: Gallbladder not visualized after 2.5 hours as well as the administration of morphine. In addition however the intra and extrahepatic bile ducts are not well seen. Nonvisualization of the gallbladder may be secondary to cholecystitis versus underlying hepatocellular dysfunction (patient bilirubin greater than 3). Limited visualization of the biliary tree activity is likely secondary to hepatic cellular  dysfunction. Biliary obstruction is felt much less likely as GI tract activity is observed. Electronically Signed   By: Jeb Levering M.D.   On: 07/14/2016 02:59   Dg Chest Port 1 View  Result Date: 07/13/2016 CLINICAL DATA:  Effusion. Abnormal labs.Hx of CKD, dialysis patient.Ex-smoker, quit ~30 years ago. EXAM: PORTABLE CHEST 1 VIEW COMPARISON:  05/24/2016  FINDINGS: Cardiac silhouette is normal in size. No mediastinal or hilar masses or evidence of adenopathy. There is vascular congestion. Left greater than right pleural effusions are noted. There is some hazy opacity in the lung bases, likely due to atelectasis. No pneumothorax. Right internal jugular dual-lumen central venous catheter is stable and well positioned. IMPRESSION: 1. Small, left greater than right, pleural effusions with associated lung base opacity, most likely atelectasis. 2. Central vascular congestion without overt pulmonary edema. Electronically Signed   By: Lajean Manes M.D.   On: 07/13/2016 17:27   US Abdomen Limited Ruq  Result Date: 07/13/2016 CLINICAL DATA:  Right upper quadrant pain for 2 days. Nausea and vomiting for 1 day. EXAM: US ABDOMEN LIMITED - RIGHT UPPER QUADRANT COMPARISON:  None. FINDINGS: Gallbladder: The gallbladder is mildly distended measuring 5.2 cm in transverse dimension. This also mild gallbladder wall thickening and gallbladder sludge. No definite gallstones or pericholecystic fluid. Negative sonographic Murphy sign. Common bile duct: Diameter: 4.1 mm Liver: Normal echogenicity without focal lesion or biliary dilatation. Small amount of right upper quadrant ascites. Right pleural effusion noted. IMPRESSION: 1. Distended gallbladder with mild gallbladder wall thickening and gallbladder sludge. Could not exclude acalculous cholecystitis. 2. Normal caliber common bile duct. 3. Normal liver. 4. Small amount of right upper quadrant ascites and right pleural effusion. Electronically Signed   By: Marijo Sanes M.D.   On: 07/13/2016 12:05    Assessment & Plan:   Multiple myeloma not having achieved remission ([place]) I review his recent test results with him and family. Serum light chain has dropped significantly to near normal ratio Due to recent pancytopenia, treatment is placed on hold Continue aggressive supportive care  Antineoplastic chemotherapy induced  pancytopenia (HCC) Pancytopenia has improved since discontinuation of Revlimid. Continue close monitoring for now The patient denies any recent signs or symptoms of bleeding such as spontaneous epistaxis, hematuria or hematochezia. The anemia is related to chronic renal failure. 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

## 2016-07-14 NOTE — Progress Notes (Signed)
In and Out Cath preformed with Acquanetta Chain, RN.  No complications, Sterile technique preformed. 1L out

## 2016-07-14 NOTE — Progress Notes (Signed)
PULMONARY / CRITICAL CARE MEDICINE   Name: Thomas [name] MRN: [id number] DOB: February 21, 1946    ADMISSION DATE:  07/13/2016 CONSULTATION DATE:  8/3  REFERRING MD:  Maryan Rued (EDP) CHIEF COMPLAINT:  Hypotension   HISTORY OF PRESENT ILLNESS:  70yo male, previously healthy until May 2017 when he was dx with multiple myeloma with mets to spine and L femur and related renal failure now on HD. He is currently undergoing chemotherapy and radiation to L femur.  He presented 8/3 with 1 day hx nausea, bilious vomiting and abd pain.  Had been having diarrhea the week before, significant with up to 9 or 10 episodes per day for at least 3 - 4 days.  Diarrhea felt to be due to new chemo meds which were started last week.  Pt was given anti-diarrheals which did eventually help control frequency but did not completely stop the diarrhea.  He has had decreased PO intake since symptoms first began. In ER he was hypotensive, initial lactate 4.7 improved to 2.7 after just 1.2L IVF.  His abd u/s showed ?acalculous cholecystitis.  He was seen by surgery and is awaiting HIDA scan.  Initially improved and IMTS was called to admit.  However despite improved lactate he continues to be hypotensive and PCCM called for consult.   Currently remains in ER, feeling better in terms of nausea and vomiting.  Only has abd pain if his abdomen is palpated, greatest RUQ but not severe, has not wanted pain meds.  Denies fevers/chills/sweats, SOB, chest pain, syncope, hematemesis, melena.  Last chemo session last week.  SUBJECTIVE:  No events overnight, feels better, hungry.  VITAL SIGNS: BP 93/62   Pulse 91   Temp 98 F (36.7 C) (Oral)   Resp 17   Wt 73.4 kg (161 lb 13.1 oz)   SpO2 99%   BMI 21.35 kg/m   HEMODYNAMICS:    VENTILATOR SETTINGS:    INTAKE / OUTPUT: I/O last 3 completed shifts: In: 2199.4 [I.V.:1569.4; Other:30; IV [id number]:185] Out: -   PHYSICAL EXAMINATION: General: Middle aged male, resting in bed, in  NAD. Neuro: A&O x 3, non-focal.  HEENT: [place]/AT. PERRL, sclerae anicteric. Cardiovascular: RRR, no M/R/G.  Lungs: Respirations even and unlabored.  CTA bilaterally, No W/R/R.  Abdomen: BS x 4, soft. Moderately tender in RUQ without rebound.  Mild guarding. Musculoskeletal: No gross deformities, 1+ edema, L > R (chronic per pt). Skin: Intact, warm, no rashes.  LABS:  BMET  Recent Labs Lab 07/10/16 1019 07/13/16 0810 07/14/16 0300  NA 134* 134* 132*  K 3.6 4.2 3.8  CL  --   --  99*  CO2 27 23 21*  BUN 15.0 28.6* 35*  CREATININE 3.4* 3.3* 4.08*  GLUCOSE 125 152* 198*   Electrolytes  Recent Labs Lab 07/10/16 1019 07/13/16 0810 07/14/16 0300  CALCIUM 8.5 8.4 7.2*  MG  --   --  1.2*  PHOS  --   --  3.8   CBC  Recent Labs Lab 07/10/16 1019 07/13/16 0810 07/14/16 0300  WBC 6.3 13.6* 48.7*  HGB 8.9* 8.6* 7.5*  HCT 26.2* 26.6* 22.1*  PLT 38* 86* 50*   Coag's No results for input(s): APTT, INR in the last 168 hours.  Sepsis Markers  Recent Labs Lab 07/13/16 1500 07/13/16 1850 07/13/16 2240  LATICACIDVEN 2.75* 3.4* 3.2*   ABG No results for input(s): PHART, PCO2ART, PO2ART in the last 168 hours.  Liver Enzymes  Recent Labs Lab 07/10/16 1019 07/13/16 0810 07/14/16 0300  AST  34 67* 44*  ALT 36 74* 51  ALKPHOS 102 251* 226*  BILITOT 0.52 3.43* 5.0*  ALBUMIN 2.5* 2.3* 1.9*   Cardiac Enzymes No results for input(s): TROPONINI, PROBNP in the last 168 hours.  Glucose No results for input(s): GLUCAP in the last 168 hours.  Imaging Nm Hepatobiliary Liver Func  Result Date: 07/14/2016 CLINICAL DATA:  Right upper quadrant pain. Abnormal sonographic appearance of the gallbladder. EXAM: NUCLEAR MEDICINE HEPATOBILIARY IMAGING TECHNIQUE: Sequential images of the abdomen were obtained out to 120 minutes following intravenous administration of radiopharmaceutical. After IV administered of 3 mg morphine, imaging obtained for an additional 30 minutes.  RADIOPHARMACEUTICALS:  5.2  mCi Tc-91m Choletec IV COMPARISON:  Right upper quadrant ultrasound yesterday. FINDINGS: Imaging obtained for 2 hours and 30 minutes. There is mildly delayed hepatic uptake. There is no visualized tracer accumulation within the intrahepatic ducts. At 35 minutes tracer is visualized in the midline, which may be duodenum or common bile duct. There is midline tracer increases in intensity with to and fro trace accumulation proximal and distally likely refluxed into the stomach and passing distally proximal small bowel. After 120 minutes, no gallbladder activity. As small bowel activity was visualized, 3 mg of morphine was administered. Increased GI tract activity, no gallbladder visualization after morphine. IMPRESSION: Gallbladder not visualized after 2.5 hours as well as the administration of morphine. In addition however the intra and extrahepatic bile ducts are not well seen. Nonvisualization of the gallbladder may be secondary to cholecystitis versus underlying hepatocellular dysfunction (patient bilirubin greater than 3). Limited visualization of the biliary tree activity is likely secondary to hepatic cellular dysfunction. Biliary obstruction is felt much less likely as GI tract activity is observed. Electronically Signed   By: MJeb LeveringM.D.   On: 07/14/2016 02:59   Dg Chest Port 1 View  Result Date: 07/13/2016 CLINICAL DATA:  Effusion. Abnormal labs.Hx of CKD, dialysis patient.Ex-smoker, quit ~30 years ago. EXAM: PORTABLE CHEST 1 VIEW COMPARISON:  05/24/2016 FINDINGS: Cardiac silhouette is normal in size. No mediastinal or hilar masses or evidence of adenopathy. There is vascular congestion. Left greater than right pleural effusions are noted. There is some hazy opacity in the lung bases, likely due to atelectasis. No pneumothorax. Right internal jugular dual-lumen central venous catheter is stable and well positioned. IMPRESSION: 1. Small, left greater than right,  pleural effusions with associated lung base opacity, most likely atelectasis. 2. Central vascular congestion without overt pulmonary edema. Electronically Signed   By: DLajean ManesM.D.   On: 07/13/2016 17:27   [place]dom[place] Limited Ruq  Result Date: 07/13/2016 CLINICAL DATA:  Right upper quadrant pain for 2 days. Nausea and vomiting for 1 day. EXAM: [place]DOM[place] LIMITED - RIGHT UPPER QUADRANT COMPARISON:  None. FINDINGS: Gallbladder: The gallbladder is mildly distended measuring 5.2 cm in transverse dimension. This also mild gallbladder wall thickening and gallbladder sludge. No definite gallstones or pericholecystic fluid. Negative sonographic Murphy sign. Common bile duct: Diameter: 4.1 mm Liver: Normal echogenicity without focal lesion or biliary dilatation. Small amount of right upper quadrant ascites. Right pleural effusion noted. IMPRESSION: 1. Distended gallbladder with mild gallbladder wall thickening and gallbladder sludge. Could not exclude acalculous cholecystitis. 2. Normal caliber common bile duct. 3. Normal liver. 4. Small amount of right upper quadrant ascites and right pleural effusion. Electronically Signed   By: PMarijo SanesM.D.   On: 07/13/2016 12:05   STUDIES:  abd u/s 8/3>>> Distended gallbladder with mild gallbladder wall thickening and gallbladder sludge.  Could not exclude acalculous cholecystitis.  Normal caliber common bile duct.  Small amount of right upper quadrant ascites and right pleural effusion.  CULTURES: BC x 2 8/3>>>   ANTIBIOTICS: Flagyl 8/3>>> vanc 8/3>>> Diflucan 8/3>>>8/4 Levaquin 8/3>>>8/4 Aztreonam 8/3>>>  SIGNIFICANT EVENTS: 8/3 > admit.  LINES/TUBES: R IJ Perm cath 6/1>>>  DISCUSSION: 70 y.o. M with recent diagnosis of MM (end of April 2017), currently undergoing chemo and radiation.  Started on new chemo drug last week (7/31) and developed significant diarrhea afterwards.  Had symptoms for 3 - 4 days then on 8/3 began to have N/V and abd pain.   Found to have possible acalculous cholecystitis.  PCCM asked to admit to ICU due to hypotension.  ASSESSMENT / PLAN:  CARDIOVASCULAR Hypotension.  Sepsis - suspect gallbladder as source. Of note, pt is immunocompromised.  P:  Goal MAP > 65. 500 ml bolus NS. KVO IVF. Try get off neo. Midodrine 5 mg TID.  INFECTIOUS A:   Sepsis - suspect gallbladder as source. Of note, pt is immunocompromised.  P:   D/C diflucan and levaquin. Follow cultures.  GASTROINTESTINAL A:   Transaminitis - suspect due to GB disease. P:   LFT's in AM. HIDA not helpful. General surgery following, appreciate the assistance.  RENAL A:   ESRD - on dialysis T/Th/Sat. AGMA - lactate. P:   KVO IVF HD per renal. Replace electrolytes as needed. BMP in AM.  HEMATOLOGIC / ONCOLOGIC A:   Recent diagnosis of MM (end of April 2017) - on chemo / radiation. Anemia - chronic. Thrombocytopenia. VTE prophylaxis. P:  Transfuse for Hgb < 7. Monitor platelet counts. SCD's only.  PULMONARY R pleural effusion. P:   Supplemental O2 as needed to keep sats >92%.  F/u CXR.   ENDOCRINE A:   No acute issues. P:   No interventions required.  NEUROLOGIC A:   No acute issues. P:   No interventions required.  FAMILY  Updates: Patient and family updated bedside.  Inter-disciplinary family meet or Palliative Care meeting due by:  07/19/16.  The patient is critically ill with multiple organ systems failure and requires high complexity decision making for assessment and support, frequent evaluation and titration of therapies, application of advanced monitoring technologies and extensive interpretation of multiple databases.   Critical Care Time devoted to patient care services described in this note is  35  Minutes. This time reflects time of care of this signee Dr Jennet Maduro. This critical care time does not reflect procedure time, or teaching time or supervisory time of PA/NP/Med student/Med Resident etc  but could involve care discussion time.  Rush Farmer, M.D. [facility] Pulmonary/Critical Care Medicine. Pager: [phone]. After hours pager: [phone].

## 2016-07-14 NOTE — Progress Notes (Signed)
[facility] Progress Note Patient Name: Ajahni Benziger DOB: June 11, 1946 MRN: [id number]   Date of Service  07/14/2016  HPI/Events of Note  Oliguria, bladder scan with > 1L urine  eICU Interventions  In and out cath     Intervention Category Intermediate Interventions: Oliguria - evaluation and management  Simonne Maffucci 07/14/2016, 3:21 PM

## 2016-07-14 NOTE — Progress Notes (Signed)
[facility] Progress Note Patient Name: Thomas [name] DOB: Dec 06, 1946 MRN: [id number]   Date of Service  07/14/2016  HPI/Events of Note  Hypomag  eICU Interventions  Mag replaced     Intervention Category Intermediate Interventions: Electrolyte abnormality - evaluation and management  DETERDING,ELIZABETH 07/14/2016, 4:58 AM

## 2016-07-14 NOTE — Procedures (Deleted)
Bronchoscopy Procedure Note Thomas [name] [id number] April 01, 1946  Procedure: Bronchoscopy Indications: Obtain specimens for culture and/or other diagnostic studies  Procedure Details Consent: Risks of procedure as well as the alternatives and risks of each were explained to the (patient/caregiver).  Consent for procedure obtained. Time Out: Verified patient identification, verified procedure, site/side was marked, verified correct patient position, special equipment/implants available, medications/allergies/relevent history reviewed, required imaging and test results available.  Performed  In preparation for procedure, patient was given 100% FiO2 and bronchoscope lubricated. Sedation: Benzodiazepines and Etomidate  Airway entered and the following bronchi were examined: RUL, RML, RLL, LUL, LLL and Bronchi.   Left mainstem 75% occluded with purulent secretions, removed. BAL sent for cultures. Bronchoscope removed.    Evaluation Hemodynamic Status: BP stable throughout; O2 sats: stable throughout Patient's Current Condition: stable Specimens:  Sent purulent fluid Complications: No apparent complications Patient did tolerate procedure well.   Jennet Maduro 07/14/2016

## 2016-07-14 NOTE — Progress Notes (Signed)
[facility] Progress Note    Subjective: Patient is awake and alert and seems much better than yesterday.  Pressor level is less, BP better, he has no pain and wants to eat.  No fever.  Objective: Vital signs in last 24 hours: Temp:  [97.8 F (36.6 C)-99.6 F (37.6 C)] 98.1 F (36.7 C) (08/04 0335) Pulse Rate:  [78-107] 93 (08/04 0700) Resp:  [10-30] 18 (08/04 0700) BP: (71-135)/(42-75) 121/70 (08/04 0700) SpO2:  [89 %-100 %] 100 % (08/04 0700) Weight:  [73.4 kg (161 lb 13.1 oz)] 73.4 kg (161 lb 13.1 oz) (08/03 1900)    Intake/Output from previous day: 08/03 0701 - 08/04 0700 In: 2199.4 [I.V.:1569.4; IV Piggyback:600] Out: -  Intake/Output this shift: No intake/output data recorded.  General: No acute distress and wants to eat.  Lungs: Clear  Abd: soft, minimally tender all over.  No RUQ tenderness and no Murphy's sign.  Extremities: No changes  Neuro: Intact  Lab Results:  @LABLAST2 (wbc:2,hgb:2,hct:2,plt:2) BMET ) Recent Labs  07/13/16 0810 07/14/16 0300  NA 134* 132*  K 4.2 3.8  CL  --  99*  CO2 23 21*  GLUCOSE 152* 198*  BUN 28.6* 35*  CREATININE 3.3* 4.08*  CALCIUM 8.4 7.2*   PT/INR No results for input(s): LABPROT, INR in the last 72 hours. ABG No results for input(s): PHART, HCO3 in the last 72 hours.  Invalid input(s): PCO2, PO2  Studies/Results: Nm Hepatobiliary Liver Func  Result Date: 07/14/2016 CLINICAL DATA:  Right upper quadrant pain. Abnormal sonographic appearance of the gallbladder. EXAM: NUCLEAR MEDICINE HEPATOBILIARY IMAGING TECHNIQUE: Sequential images of the abdomen were obtained out to 120 minutes following intravenous administration of radiopharmaceutical. After IV administered of 3 mg morphine, imaging obtained for an additional 30 minutes. RADIOPHARMACEUTICALS:  5.2  mCi Tc-66m  Choletec IV COMPARISON:  Right upper quadrant ultrasound yesterday. FINDINGS: Imaging obtained for 2 hours and 30 minutes. There is mildly delayed hepatic  uptake. There is no visualized tracer accumulation within the intrahepatic ducts. At 35 minutes tracer is visualized in the midline, which may be duodenum or common bile duct. There is midline tracer increases in intensity with to and fro trace accumulation proximal and distally likely refluxed into the stomach and passing distally proximal small bowel. After 120 minutes, no gallbladder activity. As small bowel activity was visualized, 3 mg of morphine was administered. Increased GI tract activity, no gallbladder visualization after morphine. IMPRESSION: Gallbladder not visualized after 2.5 hours as well as the administration of morphine. In addition however the intra and extrahepatic bile ducts are not well seen. Nonvisualization of the gallbladder may be secondary to cholecystitis versus underlying hepatocellular dysfunction (patient bilirubin greater than 3). Limited visualization of the biliary tree activity is likely secondary to hepatic cellular dysfunction. Biliary obstruction is felt much less likely as GI tract activity is observed. Electronically Signed   By: Jeb Levering M.D.   On: 07/14/2016 02:59   Dg Chest Port 1 View  Result Date: 07/13/2016 CLINICAL DATA:  Effusion. Abnormal labs.Hx of CKD, dialysis patient.Ex-smoker, quit ~30 years ago. EXAM: PORTABLE CHEST 1 VIEW COMPARISON:  05/24/2016 FINDINGS: Cardiac silhouette is normal in size. No mediastinal or hilar masses or evidence of adenopathy. There is vascular congestion. Left greater than right pleural effusions are noted. There is some hazy opacity in the lung bases, likely due to atelectasis. No pneumothorax. Right internal jugular dual-lumen central venous catheter is stable and well positioned. IMPRESSION: 1. Small, left greater than right, pleural effusions with associated lung  base opacity, most likely atelectasis. 2. Central vascular congestion without overt pulmonary edema. Electronically Signed   By: Lajean Manes M.D.   On:  07/13/2016 17:27   US Abdomen Limited Ruq  Result Date: 07/13/2016 CLINICAL DATA:  Right upper quadrant pain for 2 days. Nausea and vomiting for 1 day. EXAM: US ABDOMEN LIMITED - RIGHT UPPER QUADRANT COMPARISON:  None. FINDINGS: Gallbladder: The gallbladder is mildly distended measuring 5.2 cm in transverse dimension. This also mild gallbladder wall thickening and gallbladder sludge. No definite gallstones or pericholecystic fluid. Negative sonographic Murphy sign. Common bile duct: Diameter: 4.1 mm Liver: Normal echogenicity without focal lesion or biliary dilatation. Small amount of right upper quadrant ascites. Right pleural effusion noted. IMPRESSION: 1. Distended gallbladder with mild gallbladder wall thickening and gallbladder sludge. Could not exclude acalculous cholecystitis. 2. Normal caliber common bile duct. 3. Normal liver. 4. Small amount of right upper quadrant ascites and right pleural effusion. Electronically Signed   By: Marijo Sanes M.D.   On: 07/13/2016 12:05    Anti-infectives: Anti-infectives    Start     Dose/Rate Route Frequency Ordered Stop   07/14/16 1900  vancomycin (VANCOCIN) IVPB 750 mg/150 ml premix     750 mg 150 mL/hr over 60 Minutes Intravenous Every 24 hours 07/13/16 1740     07/14/16 1800  fluconazole (DIFLUCAN) IVPB 200 mg     200 mg 100 mL/hr over 60 Minutes Intravenous Every 24 hours 07/13/16 1701     07/14/16 1500  levofloxacin (LEVAQUIN) IVPB 500 mg     500 mg 100 mL/hr over 60 Minutes Intravenous Every 48 hours 07/13/16 1731     07/13/16 2200  metroNIDAZOLE (FLAGYL) IVPB 500 mg     500 mg 100 mL/hr over 60 Minutes Intravenous Every 8 hours 07/13/16 1732     07/13/16 1745  aztreonam (AZACTAM) 1 g in dextrose 5 % 50 mL IVPB     1 g 100 mL/hr over 30 Minutes Intravenous Every 8 hours 07/13/16 1731     07/13/16 1730  fluconazole (DIFLUCAN) IVPB 400 mg     400 mg 100 mL/hr over 120 Minutes Intravenous  Once 07/13/16 1701 07/13/16 2251   07/13/16 1700   vancomycin (VANCOCIN) 1,500 mg in sodium chloride 0.9 % 500 mL IVPB     1,500 mg 250 mL/hr over 120 Minutes Intravenous  Once 07/13/16 1651 07/13/16 1951   07/13/16 1445  ciprofloxacin (CIPRO) IVPB 400 mg     400 mg 200 mL/hr over 60 Minutes Intravenous  Once 07/13/16 1434 07/13/16 1555   07/13/16 1445  metroNIDAZOLE (FLAGYL) IVPB 500 mg     500 mg 100 mL/hr over 60 Minutes Intravenous  Once 07/13/16 1434 07/13/16 1721      Assessment/Plan: s/p  WBC up to 48K and his Tbili has increased up to 5.6.  Alkaline phosphatase also elevated.  The patient has not had a recent ultrasound, but he is looking as though he has cholangitis versus acute cholecystitis.  A GI consultation would be in order, and either a new ultrasound performed or MRCP.  HIDA scan did not visualize the GB, but the Tbili > 3.0 can alter the examination.  Needs to stay on antibiotics to cover enteric organisms.  We will continue to follow this patient.  LOS: 1 day   Kathryne Eriksson. [name], MD, FACS [phone] [facility] Surgery 07/14/2016

## 2016-07-15 DIAGNOSIS — D631 Anemia in chronic kidney disease: Secondary | ICD-10-CM

## 2016-07-15 DIAGNOSIS — J9601 Acute respiratory failure with hypoxia: Secondary | ICD-10-CM

## 2016-07-15 DIAGNOSIS — R112 Nausea with vomiting, unspecified: Secondary | ICD-10-CM

## 2016-07-15 LAB — BLOOD CULTURE ID PANEL (REFLEXED)
ACINETOBACTER BAUMANNII: NOT DETECTED
CANDIDA GLABRATA: NOT DETECTED
CANDIDA KRUSEI: NOT DETECTED
CANDIDA PARAPSILOSIS: NOT DETECTED
Candida albicans: NOT DETECTED
Candida tropicalis: NOT DETECTED
Carbapenem resistance: NOT DETECTED
ESCHERICHIA COLI: NOT DETECTED
Enterobacter cloacae complex: NOT DETECTED
Enterobacteriaceae species: DETECTED — AB
Enterococcus species: NOT DETECTED
Haemophilus influenzae: NOT DETECTED
KLEBSIELLA OXYTOCA: NOT DETECTED
KLEBSIELLA PNEUMONIAE: DETECTED — AB
LISTERIA MONOCYTOGENES: NOT DETECTED
Methicillin resistance: NOT DETECTED
NEISSERIA MENINGITIDIS: NOT DETECTED
Proteus species: NOT DETECTED
Pseudomonas aeruginosa: NOT DETECTED
SERRATIA MARCESCENS: NOT DETECTED
STREPTOCOCCUS AGALACTIAE: NOT DETECTED
STREPTOCOCCUS SPECIES: NOT DETECTED
Staphylococcus aureus (BCID): NOT DETECTED
Staphylococcus species: NOT DETECTED
Streptococcus pneumoniae: NOT DETECTED
Streptococcus pyogenes: NOT DETECTED
Vancomycin resistance: NOT DETECTED

## 2016-07-15 LAB — CBC
HCT: 19.8 % — ABNORMAL LOW (ref 39.0–52.0)
Hemoglobin: 6.8 g/dL — CL (ref 13.0–17.0)
MCH: 30.5 pg (ref 26.0–34.0)
MCHC: 34.3 g/dL (ref 30.0–36.0)
MCV: 88.8 fL (ref 78.0–100.0)
PLATELETS: 38 10*3/uL — AB (ref 150–400)
RBC: 2.23 MIL/uL — ABNORMAL LOW (ref 4.22–5.81)
RDW: 15.9 % — AB (ref 11.5–15.5)
WBC: 19.1 10*3/uL — AB (ref 4.0–10.5)

## 2016-07-15 LAB — HEPATIC FUNCTION PANEL
ALK PHOS: 187 U/L — AB (ref 38–126)
ALT: 38 U/L (ref 17–63)
AST: 30 U/L (ref 15–41)
Albumin: 1.8 g/dL — ABNORMAL LOW (ref 3.5–5.0)
BILIRUBIN DIRECT: 1.9 mg/dL — AB (ref 0.1–0.5)
BILIRUBIN TOTAL: 2.9 mg/dL — AB (ref 0.3–1.2)
Indirect Bilirubin: 1 mg/dL — ABNORMAL HIGH (ref 0.3–0.9)
Total Protein: 4.7 g/dL — ABNORMAL LOW (ref 6.5–8.1)

## 2016-07-15 LAB — RENAL FUNCTION PANEL
Albumin: 1.7 g/dL — ABNORMAL LOW (ref 3.5–5.0)
Anion gap: 10 (ref 5–15)
BUN: 51 mg/dL — AB (ref 6–20)
CHLORIDE: 98 mmol/L — AB (ref 101–111)
CO2: 20 mmol/L — ABNORMAL LOW (ref 22–32)
CREATININE: 4.26 mg/dL — AB (ref 0.61–1.24)
Calcium: 7.3 mg/dL — ABNORMAL LOW (ref 8.9–10.3)
GFR calc Af Amer: 15 mL/min — ABNORMAL LOW (ref >60)
GFR, EST NON AFRICAN AMERICAN: 13 mL/min — AB (ref >60)
GLUCOSE: 153 mg/dL — AB (ref 65–99)
Phosphorus: 4 mg/dL (ref 2.5–4.6)
Potassium: 3.6 mmol/L (ref 3.5–5.1)
Sodium: 128 mmol/L — ABNORMAL LOW (ref 135–145)

## 2016-07-15 LAB — MAGNESIUM: Magnesium: 2.1 mg/dL (ref 1.7–2.4)

## 2016-07-15 LAB — PREPARE RBC (CROSSMATCH)

## 2016-07-15 MED ORDER — SODIUM CHLORIDE 0.9 % IV SOLN
Freq: Once | INTRAVENOUS | Status: AC
  Administered 2016-07-15: 06:00:00 via INTRAVENOUS

## 2016-07-15 MED ORDER — HEPARIN SODIUM (PORCINE) 1000 UNIT/ML DIALYSIS: 1000.0000 [IU] | INTRAMUSCULAR | Status: DC | PRN

## 2016-07-15 MED ORDER — DARBEPOETIN ALFA 200 MCG/0.4ML IJ SOSY
200.0000 ug | PREFILLED_SYRINGE | INTRAMUSCULAR | Status: DC
  Administered 2016-07-15 – 2016-07-22 (×2): 200 ug via INTRAVENOUS
  Filled 2016-07-15 (×4): qty 0.4

## 2016-07-15 MED ORDER — SODIUM CHLORIDE 0.9 % IV SOLN: INTRAVENOUS | Status: DC

## 2016-07-15 MED ORDER — LIDOCAINE HCL (PF) 1 % IJ SOLN: 5.0000 mL | INTRAMUSCULAR | Status: DC | PRN

## 2016-07-15 MED ORDER — SODIUM CHLORIDE 0.9 % IV SOLN: 100.0000 mL | INTRAVENOUS | Status: DC | PRN

## 2016-07-15 MED ORDER — ALTEPLASE 2 MG IJ SOLR
2.0000 mg | Freq: Once | INTRAMUSCULAR | Status: DC | PRN
  Filled 2016-07-15: qty 2

## 2016-07-15 MED ORDER — MIDODRINE HCL 5 MG PO TABS
ORAL_TABLET | ORAL | Status: AC
  Filled 2016-07-15: qty 1

## 2016-07-15 MED ORDER — PENTAFLUOROPROP-TETRAFLUOROETH EX AERO: 1.0000 "application " | INHALATION_SPRAY | CUTANEOUS | Status: DC | PRN

## 2016-07-15 MED ORDER — LIDOCAINE-PRILOCAINE 2.5-2.5 % EX CREA: 1.0000 "application " | TOPICAL_CREAM | CUTANEOUS | Status: DC | PRN

## 2016-07-15 MED ORDER — HEPARIN SODIUM (PORCINE) 1000 UNIT/ML DIALYSIS: 20.0000 [IU]/kg | INTRAMUSCULAR | Status: DC | PRN

## 2016-07-15 MED ORDER — DARBEPOETIN ALFA 200 MCG/0.4ML IJ SOSY
PREFILLED_SYRINGE | INTRAMUSCULAR | Status: AC
  Filled 2016-07-15: qty 0.4

## 2016-07-15 NOTE — Progress Notes (Signed)
CRITICAL VALUE ALERT  Critical value received:  Hemoglobin 6.8  Date of notification:  07/15/16  Time of notification:  03:46  Critical value read back:Yes.    Nurse who received alert:  Jabier Gauss    MD notified (1st page):  Dr Deterding  Time of first page:  03:50  Responding MD:  Dr Sebastion Footman  Time MD responded:  03:50

## 2016-07-15 NOTE — Progress Notes (Signed)
Report given to RN Wells Guiles , [place] room 13. HD nurse made aware.

## 2016-07-15 NOTE — Progress Notes (Signed)
Bladder scan showed 690 ml. Educated the pt on importance of bladder emptying and retention when initially unwilling to have catheter placed, for over one hour. Attempted an in and out catheter, unsuccessful. Pt currently attempting to void independently, though states he does not feel an urge to void. Will continue to monitor. Guadalupe Dawn  07/15/2016 11:00 AM

## 2016-07-15 NOTE — Progress Notes (Signed)
Subjective:  Pressors off- no spontaneous UOP- refuses foley- got I and O cath of one liter !   He says belly is sore "everybody wants to mash it"    Objective Vital signs in last 24 hours: Vitals:   07/15/16 0400 07/15/16 0500 07/15/16 0600 07/15/16 0622  BP: 104/72 103/67 113/68   Pulse: 92 91 89   Resp: (!) 21 16 16    Temp: 97.8 F (36.6 C)  97.9 F (36.6 C) 97.9 F (36.6 C)  TempSrc: Oral  Oral Oral  SpO2: 95% 94% 95%   Weight:   76.8 kg (169 lb 5 oz)    Weight change: 3.4 kg (7 lb 7.9 oz)  Intake/Output Summary (Last 24 hours) at 07/15/16 0630 Last data filed at 07/15/16 1601  Gross per 24 hour  Intake           1162.5 ml  Output             1000 ml  Net            162.5 ml   Dialysis prescription: Tuesday/Thursday/Saturday at [facility] 4 hours, 180 dialyzer, BFR 400, DFR 800, EDW 73 kg, 3K/2.25 calcium, no UF profile, no sodium modeling. Aranesp 40 g weekly, heparin bolus 73,000 units, tunneled RIJ dialysis catheter with maturing left BCF.  Assessment/Plan: 1. Abdominal pain/nausea/vomiting: Possible acalculous cholecystitis-seen earlier by surgery HIDA scan not revealing- surg now rec GI consult for further management.  WBC 48K!! On broad spectrum abx now down to 19K 2. End-stage renal disease: Secondary to multiple myeloma and getting hemodialysis via right IJ tunneled dialysis catheter- last done on Tuesday.  Thought was that maybe he was recovering some renal function with treatment of myeloma but now with not much uop I am afraid he likely suffered another kidney injury and BUN/crt did go up from yest. He has pitting dependent edema-likely third spacing from low albumin- will go ahead and do HD today via PC to keep him out of trouble.  Continue with bladder scans, in and out cath when needed    3. 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 Transfusion this AM 4. 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 Appears that he is not on VDRA. 6. HTN/vol- pitting edema and hyponatremia- but soft BP- is off pressors- UF as able     Thomas [name]    Labs: Basic Metabolic Panel:  Recent Labs Lab 07/13/16 0810 07/14/16 0300 07/15/16 0222  NA 134* 132* 128*  K 4.2 3.8 3.6  CL  --  99* 98*  CO2 23 21* 20*  GLUCOSE 152* 198* 153*  BUN 28.6* 35* 51*  CREATININE 3.3* 4.08* 4.26*  CALCIUM 8.4 7.2* 7.3*  PHOS  --  3.8 4.0   Liver Function Tests:  Recent Labs Lab 07/10/16 1019 07/13/16 0810 07/14/16 0300 07/15/16 0222  AST 34 67* 44*  --   ALT 36 74* 51  --   ALKPHOS 102 251* 226*  --   BILITOT 0.52 3.43* 5.0*  --   PROT 6.6 6.4 5.3*  --   ALBUMIN 2.5* 2.3* 1.9* 1.7*    Recent Labs Lab 07/13/16 0932  LIPASE 15   No results for input(s): AMMONIA in the last 168 hours. CBC:  Recent Labs Lab 07/10/16 1019 07/13/16 0810 07/14/16 0300 07/15/16 0222  WBC 6.3  13.6* 48.7* 19.1*  NEUTROABS 5.4 12.6*  --   --   HGB 8.9* 8.6* 7.5* 6.8*  HCT 26.2* 26.6* 22.1* 19.8*  MCV 89.7 92.1 89.8 88.8  PLT 38* 86* 50* 38*   Cardiac Enzymes: No results for input(s): CKTOTAL, CKMB, CKMBINDEX, TROPONINI in the last 168 hours. CBG: No results for input(s): GLUCAP in the last 168 hours.  Iron Studies: No results for input(s): IRON, TIBC, TRANSFERRIN, FERRITIN in the last 72 hours. Studies/Results: Nm Hepatobiliary Liver Func  Result Date: 07/14/2016 CLINICAL DATA:  Right upper quadrant pain. Abnormal sonographic appearance of the gallbladder. EXAM: NUCLEAR MEDICINE HEPATOBILIARY IMAGING TECHNIQUE: Sequential images of the abdomen were obtained out to 120 minutes following intravenous administration of radiopharmaceutical. After IV administered of 3 mg morphine, imaging obtained for an additional 30  minutes. RADIOPHARMACEUTICALS:  5.2  mCi Tc-27m Choletec IV COMPARISON:  Right upper quadrant ultrasound yesterday. FINDINGS: Imaging obtained for 2 hours and 30 minutes. There is mildly delayed hepatic uptake. There is no visualized tracer accumulation within the intrahepatic ducts. At 35 minutes tracer is visualized in the midline, which may be duodenum or common bile duct. There is midline tracer increases in intensity with to and fro trace accumulation proximal and distally likely refluxed into the stomach and passing distally proximal small bowel. After 120 minutes, no gallbladder activity. As small bowel activity was visualized, 3 mg of morphine was administered. Increased GI tract activity, no gallbladder visualization after morphine. IMPRESSION: Gallbladder not visualized after 2.5 hours as well as the administration of morphine. In addition however the intra and extrahepatic bile ducts are not well seen. Nonvisualization of the gallbladder may be secondary to cholecystitis versus underlying hepatocellular dysfunction (patient bilirubin greater than 3). Limited visualization of the biliary tree activity is likely secondary to hepatic cellular dysfunction. Biliary obstruction is felt much less likely as GI tract activity is observed. Electronically Signed   By: MJeb LeveringM.D.   On: 07/14/2016 02:59   Dg Chest Port 1 View  Result Date: 07/13/2016 CLINICAL DATA:  Effusion. Abnormal labs.Hx of CKD, dialysis patient.Ex-smoker, quit ~30 years ago. EXAM: PORTABLE CHEST 1 VIEW COMPARISON:  05/24/2016 FINDINGS: Cardiac silhouette is normal in size. No mediastinal or hilar masses or evidence of adenopathy. There is vascular congestion. Left greater than right pleural effusions are noted. There is some hazy opacity in the lung bases, likely due to atelectasis. No pneumothorax. Right internal jugular dual-lumen central venous catheter is stable and well positioned. IMPRESSION: 1. Small, left greater than  right, pleural effusions with associated lung base opacity, most likely atelectasis. 2. Central vascular congestion without overt pulmonary edema. Electronically Signed   By: DLajean ManesM.D.   On: 07/13/2016 17:27   [place]domen Limited Ruq  Result Date: 07/13/2016 CLINICAL DATA:  Right upper quadrant pain for 2 days. Nausea and vomiting for 1 day. EXAM: [place]DOM[place] LIMITED - RIGHT UPPER QUADRANT COMPARISON:  None. FINDINGS: Gallbladder: The gallbladder is mildly distended measuring 5.2 cm in transverse dimension. This also mild gallbladder wall thickening and gallbladder sludge. No definite gallstones or pericholecystic fluid. Negative sonographic Murphy sign. Common bile duct: Diameter: 4.1 mm Liver: Normal echogenicity without focal lesion or biliary dilatation. Small amount of right upper quadrant ascites. Right pleural effusion noted. IMPRESSION: 1. Distended gallbladder with mild gallbladder wall thickening and gallbladder sludge. Could not exclude acalculous cholecystitis. 2. Normal caliber common bile duct. 3. Normal liver. 4. Small amount of right upper quadrant ascites and right pleural effusion. Electronically  Signed   By: Marijo Sanes M.D.   On: 07/13/2016 12:05   Medications: Infusions: . phenylephrine (NEO-SYNEPHRINE) Adult infusion Stopped (07/15/16 0353)    Scheduled Medications: . aztreonam  1 g Intravenous Q24H  . famotidine (PEPCID) IV  20 mg Intravenous Q24H  . metronidazole  500 mg Intravenous Q8H  . midodrine  5 mg Oral TID WC  . norepinephrine (LEVOPHED) Adult infusion  0-40 mcg/min Intravenous Once  . vancomycin  750 mg Intravenous Q T,Th,Sa-HD    have reviewed scheduled and prn medications.  Physical Exam: General: alert, NAD Heart: RRR Lungs: mostly clear Abdomen: soft, non tender Extremities: pitting dependent edema Dialysis Access: PC and left AVF is patent   - placed 6/14   07/15/2016,7:33 AM  LOS: 2 days

## 2016-07-15 NOTE — Progress Notes (Signed)
PHARMACY - PHYSICIAN COMMUNICATION CRITICAL VALUE ALERT - BLOOD CULTURE IDENTIFICATION (BCID)  Results for orders placed or performed during the hospital encounter of 07/13/16  Blood Culture ID Panel (Reflexed) (Collected: 07/13/2016  5:04 PM)  Result Value Ref Range   Enterococcus species NOT DETECTED NOT DETECTED   Vancomycin resistance NOT DETECTED NOT DETECTED   Listeria monocytogenes NOT DETECTED NOT DETECTED   Staphylococcus species NOT DETECTED NOT DETECTED   Staphylococcus aureus NOT DETECTED NOT DETECTED   Methicillin resistance NOT DETECTED NOT DETECTED   Streptococcus species NOT DETECTED NOT DETECTED   Streptococcus agalactiae NOT DETECTED NOT DETECTED   Streptococcus pneumoniae NOT DETECTED NOT DETECTED   Streptococcus pyogenes NOT DETECTED NOT DETECTED   Acinetobacter baumannii NOT DETECTED NOT DETECTED   Enterobacteriaceae species DETECTED (A) NOT DETECTED   Enterobacter cloacae complex NOT DETECTED NOT DETECTED   Escherichia coli NOT DETECTED NOT DETECTED   Klebsiella oxytoca NOT DETECTED NOT DETECTED   Klebsiella pneumoniae DETECTED (A) NOT DETECTED   Proteus species NOT DETECTED NOT DETECTED   Serratia marcescens NOT DETECTED NOT DETECTED   Carbapenem resistance NOT DETECTED NOT DETECTED   Haemophilus influenzae NOT DETECTED NOT DETECTED   Neisseria meningitidis NOT DETECTED NOT DETECTED   Pseudomonas aeruginosa NOT DETECTED NOT DETECTED   Candida albicans NOT DETECTED NOT DETECTED   Candida glabrata NOT DETECTED NOT DETECTED   Candida krusei NOT DETECTED NOT DETECTED   Candida parapsilosis NOT DETECTED NOT DETECTED   Candida tropicalis NOT DETECTED NOT DETECTED    Name of physician (or Provider) Contacted: Rexene Edison, NP  Changes to prescribed antibiotics required: None  Levester Fresh, PharmD, BCPS, [facility] Clinical Pharmacist Pager [phone] 07/15/2016 10:34 AM

## 2016-07-15 NOTE — Progress Notes (Signed)
[place] Progress Note Patient Name: Thomas [name] DOB: 06-07-1946 MRN: [id number]   Date of Service  07/15/2016  HPI/Events of Note  Hgb drop from 7.5 to 6.8.  eICU Interventions  Plan: Transfuse 1 unit pRBC Post-transfusion CBC     Intervention Category Intermediate Interventions: Bleeding - evaluation and treatment with blood products  DETERDING,ELIZABETH 07/15/2016, 4:11 AM

## 2016-07-15 NOTE — Progress Notes (Signed)
600 ml of urine obtained through in and out cath, second attempt by Gloriann Loan, NT. Guadalupe Dawn 07/15/2016 12:10 PM

## 2016-07-15 NOTE — Progress Notes (Addendum)
PULMONARY / CRITICAL CARE MEDICINE   Name: Thomas [name] MRN: [id number] DOB: 01/13/1946    ADMISSION DATE:  07/13/2016 CONSULTATION DATE:  Caroleen Hamman  REFERRING MD:  Maryan Rued (EDP) CHIEF COMPLAINT:  Hypotension   BRIEF: 70 y/o male with multiple myeloma s/p chemo/radiation admitted on 8/3 with septic shock after days of diarrhea.  Noted to have abnormal LFTs, unclear if he passed a biliary stone, overall improving.   SUBJECTIVE:  Feels better, angry about food choices, mild abdominal pain  VITAL SIGNS: BP 126/74   Pulse 84   Temp 98.3 F (36.8 C) (Oral)   Resp (!) 21   Wt 76.8 kg (169 lb 5 oz)   SpO2 98%   BMI 22.34 kg/m   HEMODYNAMICS:    VENTILATOR SETTINGS:    INTAKE / OUTPUT: I/O last 3 completed shifts: In: 3361.9 [I.V.:2066.9; Blood:365; Other:30; IV [id number]:591] Out: 1000 [Urine:1000]  PHYSICAL EXAMINATION: General: no distress HENT: NCAT OP clear PULM: CTA B, normal effort CV: RRR, no mgr GI: BS+, soft, nontender MSK: normal bulk and tone Neuro: Awake and alert, no distress  LABS:  BMET  Recent Labs Lab 07/13/16 0810 07/14/16 0300 07/15/16 0222  NA 134* 132* 128*  K 4.2 3.8 3.6  CL  --  99* 98*  CO2 23 21* 20*  BUN 28.6* 35* 51*  CREATININE 3.3* 4.08* 4.26*  GLUCOSE 152* 198* 153*   Electrolytes  Recent Labs Lab 07/13/16 0810 07/14/16 0300 07/15/16 0222  CALCIUM 8.4 7.2* 7.3*  MG  --  1.2* 2.1  PHOS  --  3.8 4.0   CBC  Recent Labs Lab 07/13/16 0810 07/14/16 0300 07/15/16 0222  WBC 13.6* 48.7* 19.1*  HGB 8.6* 7.5* 6.8*  HCT 26.6* 22.1* 19.8*  PLT 86* 50* 38*   Coag's No results for input(s): APTT, INR in the last 168 hours.  Sepsis Markers  Recent Labs Lab 07/13/16 1500 07/13/16 1850 07/13/16 2240  LATICACIDVEN 2.75* 3.4* 3.2*   ABG No results for input(s): PHART, PCO2ART, PO2ART in the last 168 hours.  Liver Enzymes  Recent Labs Lab 07/13/16 0810 07/14/16 0300 07/15/16 0222 07/15/16 0840  AST 67* 44*  --  30   ALT 74* 51  --  38  ALKPHOS 251* 226*  --  187*  BILITOT 3.43* 5.0*  --  2.9*  ALBUMIN 2.3* 1.9* 1.7* 1.8*   Cardiac Enzymes No results for input(s): TROPONINI, PROBNP in the last 168 hours.  Glucose No results for input(s): GLUCAP in the last 168 hours.  Imaging No results found. STUDIES:  abd u/s 8/3>>> Distended gallbladder with mild gallbladder wall thickening and gallbladder sludge. Could not exclude acalculous cholecystitis.  Normal caliber common bile duct.  Small amount of right upper quadrant ascites and right pleural effusion.  CULTURES: BC x 2 8/3>>>   ANTIBIOTICS: Flagyl 8/3>>> vanc 8/3>>> 8/5 Diflucan 8/3>>>8/4 Levaquin 8/3>>>8/4 Aztreonam 8/3>>>  SIGNIFICANT EVENTS: 8/3 > admit.  LINES/TUBES: R IJ Perm cath 6/1>>>  DISCUSSION: 70 y.o. M with recent diagnosis of MM (end of April 2017), currently undergoing chemo and radiation.  Started on new chemo drug last week (7/31) and developed significant diarrhea afterwards.  Had symptoms for 3 - 4 days then on 8/3 began to have N/V and abd pain.  Found to have possible acalculous cholecystitis.  PCCM asked to admit to ICU due to hypotension.  ASSESSMENT / PLAN:  CARDIOVASCULAR Hypotension > resolved Sepsis - suspect gallbladder as source. Of note, pt is immunocompromised.  P:  Goal MAP >  65 KVO IVF Continue Midodrine 5 mg TID  INFECTIOUS A:   Sepsis - passed biliary stone? Uncertain etiology but now better, shock magnified by volume loss from diarrhea P:  Follow cultures. Stop vancomycin, continue aztreonam  GASTROINTESTINAL A:   Transaminitis - improving, passed biliary stone? P:   Appreciate surgery Would consult GI prior to discharge> should he have an EUS or other assessment?  RENAL A:   ESRD - on dialysis T/Th/Sat AGMA - lactate P:   HD per renal Replace electrolytes as needed BMP in AM  HEMATOLOGIC / ONCOLOGIC A:   Recent diagnosis of MM (end of April 2017) - on chemo /  radiation Anemia - chronic Thrombocytopenia VTE prophylaxis P:  Transfuse for Hgb < 7 Monitor platelet counts SCD's only  PULMONARY R pleural effusion P:   Supplemental O2 as needed to keep sats >92% F/u CXR  ENDOCRINE A:   No acute issues P:   No interventions required  NEUROLOGIC A:   No acute issues. P:   No interventions required  FAMILY  Updates: Patient and family updated bedside 8/5   Inter-disciplinary family meet or Palliative Care meeting due by:  07/19/16.  Transfer to [place], [place] PCCM Pager: [phone] Cell: [phone] After 3pm or if no response, call [phone]

## 2016-07-15 NOTE — Progress Notes (Signed)
Pt transferred to HD.  Thomas [name] 07/15/2016 3:02 PM

## 2016-07-15 NOTE — Progress Notes (Signed)
[name] Progress Note    Subjective: Patient is good spirits.  Off all pressors.  Says his abdomen is sore from everyone pressing on it yesterday.  He is mildly tender in his RUQ on the right side.  No peritonitis.  Objective: Vital signs in last 24 hours: Temp:  [97.6 F (36.4 C)-98.7 F (37.1 C)] 97.6 F (36.4 C) (08/05 0750) Pulse Rate:  [86-95] 92 (08/05 0750) Resp:  [11-26] 22 (08/05 0750) BP: (75-123)/(51-86) 115/71 (08/05 0750) SpO2:  [93 %-100 %] 96 % (08/05 0750) Weight:  [76.8 kg (169 lb 5 oz)] 76.8 kg (169 lb 5 oz) (08/05 0600)    Intake/Output from previous day: 08/04 0701 - 08/05 0700 In: 1162.5 [I.V.:497.5; Blood:365; IV Piggyback:300] Out: 1000 [Urine:1000] Intake/Output this shift: Total I/O In: 355 [Other:355] Out: -   General: No acute distress.  Urinary retention of 1.0 liter.  Patient does not want a Foley, but was cathed for 1.0 liter.  UA not sent   Lungs: Clear to auscultation.  Oxygen saturations are good.  Abd: Soft, excellent bowel sounds.  Minimally tender in the RUQ.   Certainly does not clinically correlate with source of sepsis.  Extremities: No changes  Neuro: Intact.  Wants to eat.  Lab Results:   Recent Labs  07/14/16 0300 07/15/16 0222  NA 132* 128*  K 3.8 3.6  CL 99* 98*  CO2 21* 20*  GLUCOSE 198* 153*  BUN 35* 51*  CREATININE 4.08* 4.26*  CALCIUM 7.2* 7.3*   PT/INR No results for input(s): LABPROT, INR in the last 72 hours. ABG No results for input(s): PHART, HCO3 in the last 72 hours.  Invalid input(s): PCO2, PO2  Studies/Results: Nm Hepatobiliary Liver Func  Result Date: 07/14/2016 CLINICAL DATA:  Right upper quadrant pain. Abnormal sonographic appearance of the gallbladder. EXAM: NUCLEAR MEDICINE HEPATOBILIARY IMAGING TECHNIQUE: Sequential images of the abdomen were obtained out to 120 minutes following intravenous administration of radiopharmaceutical. After IV administered of 3 mg morphine, imaging obtained for an  additional 30 minutes. RADIOPHARMACEUTICALS:  5.2  mCi Tc-3m  Choletec IV COMPARISON:  Right upper quadrant ultrasound yesterday. FINDINGS: Imaging obtained for 2 hours and 30 minutes. There is mildly delayed hepatic uptake. There is no visualized tracer accumulation within the intrahepatic ducts. At 35 minutes tracer is visualized in the midline, which may be duodenum or common bile duct. There is midline tracer increases in intensity with to and fro trace accumulation proximal and distally likely refluxed into the stomach and passing distally proximal small bowel. After 120 minutes, no gallbladder activity. As small bowel activity was visualized, 3 mg of morphine was administered. Increased GI tract activity, no gallbladder visualization after morphine. IMPRESSION: Gallbladder not visualized after 2.5 hours as well as the administration of morphine. In addition however the intra and extrahepatic bile ducts are not well seen. Nonvisualization of the gallbladder may be secondary to cholecystitis versus underlying hepatocellular dysfunction (patient bilirubin greater than 3). Limited visualization of the biliary tree activity is likely secondary to hepatic cellular dysfunction. Biliary obstruction is felt much less likely as GI tract activity is observed. Electronically Signed   By: Jeb Levering M.D.   On: 07/14/2016 02:59   Dg Chest Port 1 View  Result Date: 07/13/2016 CLINICAL DATA:  Effusion. Abnormal labs.Hx of CKD, dialysis patient.Ex-smoker, quit ~30 years ago. EXAM: PORTABLE CHEST 1 VIEW COMPARISON:  05/24/2016 FINDINGS: Cardiac silhouette is normal in size. No mediastinal or hilar masses or evidence of adenopathy. There is vascular congestion. Left  greater than right pleural effusions are noted. There is some hazy opacity in the lung bases, likely due to atelectasis. No pneumothorax. Right internal jugular dual-lumen central venous catheter is stable and well positioned. IMPRESSION: 1. Small, left  greater than right, pleural effusions with associated lung base opacity, most likely atelectasis. 2. Central vascular congestion without overt pulmonary edema. Electronically Signed   By: Lajean Manes M.D.   On: 07/13/2016 17:27   US Abdomen Limited Ruq  Result Date: 07/13/2016 CLINICAL DATA:  Right upper quadrant pain for 2 days. Nausea and vomiting for 1 day. EXAM: US ABDOMEN LIMITED - RIGHT UPPER QUADRANT COMPARISON:  None. FINDINGS: Gallbladder: The gallbladder is mildly distended measuring 5.2 cm in transverse dimension. This also mild gallbladder wall thickening and gallbladder sludge. No definite gallstones or pericholecystic fluid. Negative sonographic Murphy sign. Common bile duct: Diameter: 4.1 mm Liver: Normal echogenicity without focal lesion or biliary dilatation. Small amount of right upper quadrant ascites. Right pleural effusion noted. IMPRESSION: 1. Distended gallbladder with mild gallbladder wall thickening and gallbladder sludge. Could not exclude acalculous cholecystitis. 2. Normal caliber common bile duct. 3. Normal liver. 4. Small amount of right upper quadrant ascites and right pleural effusion. Electronically Signed   By: Marijo Sanes M.D.   On: 07/13/2016 12:05    Anti-infectives: Anti-infectives    Start     Dose/Rate Route Frequency Ordered Stop   07/15/16 1800  aztreonam (AZACTAM) 1 g in dextrose 5 % 50 mL IVPB     1 g 100 mL/hr over 30 Minutes Intravenous Every 24 hours 07/14/16 1232     07/15/16 1200  vancomycin (VANCOCIN) IVPB 750 mg/150 ml premix     750 mg 150 mL/hr over 60 Minutes Intravenous Every T-Th-Sa (Hemodialysis) 07/14/16 1232     07/14/16 1900  vancomycin (VANCOCIN) IVPB 750 mg/150 ml premix  Status:  Discontinued     750 mg 150 mL/hr over 60 Minutes Intravenous Every 24 hours 07/13/16 1740 07/14/16 1232   07/14/16 1800  fluconazole (DIFLUCAN) IVPB 200 mg  Status:  Discontinued     200 mg 100 mL/hr over 60 Minutes Intravenous Every 24 hours 07/13/16  1701 07/14/16 0949   07/14/16 1500  levofloxacin (LEVAQUIN) IVPB 500 mg  Status:  Discontinued     500 mg 100 mL/hr over 60 Minutes Intravenous Every 48 hours 07/13/16 1731 07/14/16 0949   07/13/16 2200  metroNIDAZOLE (FLAGYL) IVPB 500 mg     500 mg 100 mL/hr over 60 Minutes Intravenous Every 8 hours 07/13/16 1732     07/13/16 1745  aztreonam (AZACTAM) 1 g in dextrose 5 % 50 mL IVPB  Status:  Discontinued     1 g 100 mL/hr over 30 Minutes Intravenous Every 8 hours 07/13/16 1731 07/14/16 1232   07/13/16 1730  fluconazole (DIFLUCAN) IVPB 400 mg     400 mg 100 mL/hr over 120 Minutes Intravenous  Once 07/13/16 1701 07/13/16 2251   07/13/16 1700  vancomycin (VANCOCIN) 1,500 mg in sodium chloride 0.9 % 500 mL IVPB     1,500 mg 250 mL/hr over 120 Minutes Intravenous  Once 07/13/16 1651 07/13/16 1951   07/13/16 1445  ciprofloxacin (CIPRO) IVPB 400 mg     400 mg 200 mL/hr over 60 Minutes Intravenous  Once 07/13/16 1434 07/13/16 1555   07/13/16 1445  metroNIDAZOLE (FLAGYL) IVPB 500 mg     500 mg 100 mL/hr over 60 Minutes Intravenous  Once 07/13/16 1434 07/13/16 1721      Assessment/Plan:  s/Clear liquid dietClear liquid diet  I see that GI has not been contacted.  Will repeat LFTs today to see if they are improving.  May have passed a CBD stone, but initial studies did not show a dilated CBD  LOS: 2 days   Kathryne Eriksson. [name], MD, FACS [phone] [facility] 07/15/2016

## 2016-07-16 ENCOUNTER — Inpatient Hospital Stay (HOSPITAL_COMMUNITY): Payer: PPO

## 2016-07-16 DIAGNOSIS — R945 Abnormal results of liver function studies: Secondary | ICD-10-CM

## 2016-07-16 DIAGNOSIS — R7989 Other specified abnormal findings of blood chemistry: Secondary | ICD-10-CM

## 2016-07-16 LAB — CBC WITH DIFFERENTIAL/PLATELET
BASOS PCT: 0 %
Basophils Absolute: 0 10*3/uL (ref 0.0–0.1)
EOS PCT: 0 %
Eosinophils Absolute: 0 10*3/uL (ref 0.0–0.7)
HEMATOCRIT: 24.3 % — AB (ref 39.0–52.0)
HEMOGLOBIN: 8.2 g/dL — AB (ref 13.0–17.0)
LYMPHS PCT: 1 %
Lymphs Abs: 0.2 10*3/uL — ABNORMAL LOW (ref 0.7–4.0)
MCH: 29.7 pg (ref 26.0–34.0)
MCHC: 33.7 g/dL (ref 30.0–36.0)
MCV: 88 fL (ref 78.0–100.0)
MONOS PCT: 4 %
Monocytes Absolute: 0.6 10*3/uL (ref 0.1–1.0)
NEUTROS ABS: 14.8 10*3/uL — AB (ref 1.7–7.7)
NEUTROS PCT: 95 %
Platelets: 27 10*3/uL — CL (ref 150–400)
RBC: 2.76 MIL/uL — ABNORMAL LOW (ref 4.22–5.81)
RDW: 16.2 % — ABNORMAL HIGH (ref 11.5–15.5)
WBC: 15.6 10*3/uL — ABNORMAL HIGH (ref 4.0–10.5)

## 2016-07-16 LAB — COMPREHENSIVE METABOLIC PANEL
ALK PHOS: 298 U/L — AB (ref 38–126)
ALT: 34 U/L (ref 17–63)
ANION GAP: 9 (ref 5–15)
AST: 38 U/L (ref 15–41)
Albumin: 1.7 g/dL — ABNORMAL LOW (ref 3.5–5.0)
BILIRUBIN TOTAL: 3.7 mg/dL — AB (ref 0.3–1.2)
BUN: 24 mg/dL — ABNORMAL HIGH (ref 6–20)
CALCIUM: 7.4 mg/dL — AB (ref 8.9–10.3)
CO2: 26 mmol/L (ref 22–32)
CREATININE: 2.48 mg/dL — AB (ref 0.61–1.24)
Chloride: 97 mmol/L — ABNORMAL LOW (ref 101–111)
GFR calc non Af Amer: 25 mL/min — ABNORMAL LOW (ref >60)
GFR, EST AFRICAN AMERICAN: 29 mL/min — AB (ref >60)
Glucose, Bld: 129 mg/dL — ABNORMAL HIGH (ref 65–99)
Potassium: 3.6 mmol/L (ref 3.5–5.1)
Sodium: 132 mmol/L — ABNORMAL LOW (ref 135–145)
TOTAL PROTEIN: 4.8 g/dL — AB (ref 6.5–8.1)

## 2016-07-16 LAB — TYPE AND SCREEN
ABO/RH(D): A POS
Antibody Screen: NEGATIVE
UNIT DIVISION: 0

## 2016-07-16 MED ORDER — LORAZEPAM 2 MG/ML IJ SOLN
1.0000 mg | Freq: Once | INTRAMUSCULAR | Status: AC
  Administered 2016-07-16: 1 mg via INTRAVENOUS
  Filled 2016-07-16: qty 1

## 2016-07-16 NOTE — Progress Notes (Signed)
Subjective: C/o pain right side of abdomen, points actually to lower abdomen, weak, not hungry  Objective: Vital signs in last 24 hours: Temp:  [97.7 F (36.5 C)-99.2 F (37.3 C)] 98.3 F (36.8 C) (08/06 0935) Pulse Rate:  [82-96] 85 (08/06 0935) Resp:  [13-23] 18 (08/06 0935) BP: (98-136)/(53-77) 102/53 (08/06 0935) SpO2:  [92 %-100 %] 97 % (08/06 0935) Weight:  [72.4 kg (159 lb 9.8 oz)-75.6 kg (166 lb 10.7 oz)] 73.5 kg (162 lb) (08/05 2022) Last BM Date: 07/15/16  Intake/Output from previous day: 08/05 0701 - 08/06 0700 In: 905 [P.O.:420; I.V.:30; IV Piggyback:100] Out: 3100 [Urine:600] Intake/Output this shift: No intake/output data recorded.  GI: nontender to exam tdoay  Lab Results:   Recent Labs  07/15/16 0222 07/16/16 0744  WBC 19.1* 15.6*  HGB 6.8* 8.2*  HCT 19.8* 24.3*  PLT 38* 27*   BMET  Recent Labs  07/15/16 0222 07/16/16 0744  NA 128* 132*  K 3.6 3.6  CL 98* 97*  CO2 20* 26  GLUCOSE 153* 129*  BUN 51* 24*  CREATININE 4.26* 2.48*  CALCIUM 7.3* 7.4*   PT/INR No results for input(s): LABPROT, INR in the last 72 hours. ABG No results for input(s): PHART, HCO3 in the last 72 hours.  Invalid input(s): PCO2, PO2  Studies/Results: No results found.  Anti-infectives: Anti-infectives    Start     Dose/Rate Route Frequency Ordered Stop   07/15/16 1800  aztreonam (AZACTAM) 1 g in dextrose 5 % 50 mL IVPB     1 g 100 mL/hr over 30 Minutes Intravenous Every 24 hours 07/14/16 1232     07/15/16 1200  vancomycin (VANCOCIN) IVPB 750 mg/150 ml premix  Status:  Discontinued     750 mg 150 mL/hr over 60 Minutes Intravenous Every T-Th-Sa (Hemodialysis) 07/14/16 1232 07/15/16 1445   07/14/16 1900  vancomycin (VANCOCIN) IVPB 750 mg/150 ml premix  Status:  Discontinued     750 mg 150 mL/hr over 60 Minutes Intravenous Every 24 hours 07/13/16 1740 07/14/16 1232   07/14/16 1800  fluconazole (DIFLUCAN) IVPB 200 mg  Status:  Discontinued     200 mg 100  mL/hr over 60 Minutes Intravenous Every 24 hours 07/13/16 1701 07/14/16 0949   07/14/16 1500  levofloxacin (LEVAQUIN) IVPB 500 mg  Status:  Discontinued     500 mg 100 mL/hr over 60 Minutes Intravenous Every 48 hours 07/13/16 1731 07/14/16 0949   07/13/16 2200  metroNIDAZOLE (FLAGYL) IVPB 500 mg     500 mg 100 mL/hr over 60 Minutes Intravenous Every 8 hours 07/13/16 1732     07/13/16 1745  aztreonam (AZACTAM) 1 g in dextrose 5 % 50 mL IVPB  Status:  Discontinued     1 g 100 mL/hr over 30 Minutes Intravenous Every 8 hours 07/13/16 1731 07/14/16 1232   07/13/16 1730  fluconazole (DIFLUCAN) IVPB 400 mg     400 mg 100 mL/hr over 120 Minutes Intravenous  Once 07/13/16 1701 07/13/16 2251   07/13/16 1700  vancomycin (VANCOCIN) 1,500 mg in sodium chloride 0.9 % 500 mL IVPB     1,500 mg 250 mL/hr over 120 Minutes Intravenous  Once 07/13/16 1651 07/13/16 1951   07/13/16 1445  ciprofloxacin (CIPRO) IVPB 400 mg     400 mg 200 mL/hr over 60 Minutes Intravenous  Once 07/13/16 1434 07/13/16 1555   07/13/16 1445  metroNIDAZOLE (FLAGYL) IVPB 500 mg     500 mg 100 mL/hr over 60 Minutes Intravenous  Once 07/13/16 1434  07/13/16 1721      Assessment/Plan: abd pain, increased lfts  - gi consult would do this today - I think either mrcp or ct (would have to be without contrast) is next appropriate step to evaluate Will continue to follow - he does not have stones and not sure he has cholecystitis based on tests and exam   [facility] 07/16/2016

## 2016-07-16 NOTE — Progress Notes (Signed)
CRITICAL VALUE ALERT  Critical value received:  PLT 27  Date of notification:  07/16/16  Time of notification:  09:20  Critical value read back: yes  Nurse who received alert:  Cassell Smiles   MD notified (1st page):  Abrol  Time of first page:  09:23  MD notified (2nd page):  Time of second page:  Responding MD:    Time MD responded:

## 2016-07-16 NOTE — Progress Notes (Signed)
Late entry  Bladder scan 453ml    Patient attempted to void ,unsuccessful stated usually around 2-3 am, IF POSSIBLE TO WAIT TILL THEN.

## 2016-07-16 NOTE — Progress Notes (Signed)
Subjective:  Moved to 6 E- got HD late yesterday  - removed 2500- complaining about just about everything- refusing I and o cath- has yet to void spontaneously  Objective Vital signs in last 24 hours: Vitals:   07/15/16 1813 07/15/16 1854 07/15/16 2022 07/16/16 0647  BP: 124/72 124/76 116/66 111/63  Pulse: 90 95 96 90  Resp: (!) _0 Temp: 98 F (36.7 C) 97.7 F (36.5 C) 98.7 F (37.1 C) 99.2 F (37.3 C)  TempSrc: Oral Oral Oral Oral  SpO2: 98% 97% 92% 96%  Weight: 72.4 kg (159 lb 9.8 oz)  73.5 kg (162 lb)    Weight change: -1.2 kg (-2 lb 10.3 oz)  Intake/Output Summary (Last 24 hours) at 07/16/16 0845 Last data filed at 07/16/16 7414  Gross per 24 hour  Intake              330 ml  Output             3100 ml  Net            -2770 ml   Dialysis prescription: Tuesday/Thursday/Saturday at [facility] 4 hours, 180 dialyzer, BFR 400, DFR 800, EDW 73 kg, 3K/2.25 calcium, no UF profile, no sodium modeling. Aranesp 40 g weekly, heparin bolus 73,000 units, tunneled RIJ dialysis catheter with maturing left BCF.  Assessment/Plan: 1. Abdominal pain/nausea/vomiting: Possible acalculous cholecystitis-seen earlier by surgery HIDA scan not revealing- surg now rec GI consult for further management. On broad spectrum abx now down to 19K- still tender 2. End-stage renal disease: Secondary to multiple myeloma and getting hemodialysis via right IJ tunneled dialysis catheter- last done on Tuesday.  Thought was that maybe he was recovering some renal function with treatment of myeloma but now with not much uop I am afraid he likely suffered another kidney injury and BUN/crt going up. He has pitting dependent edema-likely third spacing from low albumin- did HD last night via PC to keep him out of trouble.  Continue with bladder scans, in and out cath when needed - refusing in and out caths- "they are not giving me enough time to go " attempted to explain the rationale of not letting  liters of urine stay in bladder to no avail.  Labs tomorrow to assess for HD need  3. Anemia: Secondary to ESRD/multiple myeloma-max ESA. Transfusion 8/5 4. 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 Appears that he is not on VDRA as OP. 6. HTN/vol- pitting edema and hyponatremia- Uf with HD last night    Guy Seese A    Labs: Basic Metabolic Panel:  Recent Labs Lab 07/13/16 0810 07/14/16 0300 07/15/16 0222  NA 134* 132* 128*  K 4.2 3.8 3.6  CL  --  99* 98*  CO2 23 21* 20*  GLUCOSE 152* 198* 153*  BUN 28.6* 35* 51*  CREATININE 3.3* 4.08* 4.26*  CALCIUM 8.4 7.2* 7.3*  PHOS  --  3.8 4.0   Liver Function Tests:  Recent Labs Lab 07/13/16 0810 07/14/16 0300 07/15/16 0222 07/15/16 0840  AST 67* 44*  --  30  ALT 74* 51  --  38  ALKPHOS 251* 226*  --  187*  BILITOT 3.43* 5.0*  --  2.9*  PROT 6.4 5.3*  --  4.7*  ALBUMIN  2.3* 1.9* 1.7* 1.8*    Recent Labs Lab 07/13/16 0932  LIPASE 15   No results for input(s): AMMONIA in the last 168 hours. CBC:  Recent Labs Lab 07/10/16 1019 07/13/16 0810 07/14/16 0300 07/15/16 0222  WBC 6.3 13.6* 48.7* 19.1*  NEUTROABS 5.4 12.6*  --   --   HGB 8.9* 8.6* 7.5* 6.8*  HCT 26.2* 26.6* 22.1* 19.8*  MCV 89.7 92.1 89.8 88.8  PLT 38* 86* 50* 38*   Cardiac Enzymes: No results for input(s): CKTOTAL, CKMB, CKMBINDEX, TROPONINI in the last 168 hours. CBG: No results for input(s): GLUCAP in the last 168 hours.  Iron Studies: No results for input(s): IRON, TIBC, TRANSFERRIN, FERRITIN in the last 72 hours. Studies/Results: No results found. Medications: Infusions: . sodium chloride Stopped (07/15/16 1348)    Scheduled Medications: . aztreonam  1 g  Intravenous Q24H  . darbepoetin (ARANESP) injection - DIALYSIS  200 mcg Intravenous Q Sat-HD  . famotidine (PEPCID) IV  20 mg Intravenous Q24H  . metronidazole  500 mg Intravenous Q8H  . midodrine  5 mg Oral TID WC    have reviewed scheduled and prn medications.  Physical Exam: General: alert, NAD Heart: RRR Lungs: mostly clear Abdomen: soft, non tender Extremities: pitting dependent edema Dialysis Access: PC and left AVF is patent   - placed 6/14   07/16/2016,8:45 AM  LOS: 3 days

## 2016-07-16 NOTE — Progress Notes (Signed)
[facility] PROGRESS NOTE  Thomas [name] VHQ:[id number] DOB: 1945/12/27 DOA: 07/13/2016   PCP: No PCP Per Patient     Assessment/Plan: Principal Problem:   Abdominal pain Active Problems:   Multiple myeloma ([place])   ESRD on dialysis ([facility])   Thrombocytopenia ([facility])   Anemia of renal disease   N&V (nausea and vomiting)   Sepsis ([place])   Acute respiratory failure with hypoxia ([place])   70 year old Caucasian man with past medical history significant for ESRD secondary to multiple myeloma with metastasis to spine and left femur who has been on dialysis for the past 2 months or so (with possibly some partial renal recovery).Started on new chemo drug last week (7/31) and developed significant diarrhea afterwards. He presented on  8/3 with one-day history of nausea, vomiting and abdominal pain and some preceding diarrhea prior to that. Developed septic shock after days of diarrhea Reports decreased ability to tolerate oral intake. In the emergency room he was noted to be hypotensive and ultrasound suggestive of acalculous cholecystitis-seen by surgery and awaiting HIDA scan. He remained hypotensive upon transfer to the ICU and was started on pressors.   Assessment and plan Hypotension > resolved, off all vasopressors  Sepsis - initial suspicion for gallbladder as source. Of note, pt is immunocompromised. -Patient evaluated by general surgery, liver function is improving, patient may have possible CBD stone.If it is strongly believed that the patient is septic from a gallbladder acute acalculous cholecystitis her needs a percutaneous drain and not a cholecystectomy at this time  Continue Midodrine 5 mg TID Patient currently on aztreonam and Flagyl, vancomycin discontinued 8/5 MRCP prior to GI consult, requested [facility] to see    Thrombocytopenia-in the setting of multiple myeloma Serum light chain has dropped significantly to near normal ratio Due to recent pancytopenia, treatment  is placed on hold Pancytopenia has improved since discontinuation of Revlimid. Continue close monitoring for now anemia is related to chronic renal failure. He is getting ESA through dialysis center Will request oncology for further recommendations White blood cell count has trended down from 48.7>15.6  Transaminitis - improving, passed biliary stone? Appreciate surgery MRCP, if abnormal consider GI consult    End-stage renal disease: Secondary to multiple myeloma and getting hemodialysis via right IJ tunneled dialysis catheter- last done on Tuesday. .    Continue with bladder scans, in and out cath when needed - refusing in and out caths- "they are not giving me enough time to go " attempted to explain the rationale of not letting liters of urine stay in bladder to no avail.  Labs tomorrow to assess for HD need      Multiple myeloma-On chemotherapy/radiation to left femur-further management per hematology   DVT prophylaxsis SCDs  Code Status:  Full code    Family Communication: Discussed in detail with the patient, all imaging results, lab results explained to the patient   Disposition Plan:   Anticipate discharge in 3-4 days     Consultants:  Critical care  General surgery   STUDIES:  abd u/s 8/3>>> Distended gallbladder with mild gallbladder wall thickening and gallbladder sludge. Could not exclude acalculous cholecystitis.  Normal caliber common bile duct.  Small amount of right upper quadrant ascites and right pleural effusion.  CULTURES: BC x 2 8/3>>>   ANTIBIOTICS: Flagyl 8/3>>> vanc 8/3>>> 8/5 Diflucan 8/3>>>8/4 Levaquin 8/3>>>8/4 Aztreonam 8/3>>>     LINES/TUBES: R IJ Perm cath 6/1>>>   Antibiotics: Anti-infectives    Start  Dose/Rate Route Frequency Ordered Stop   07/15/16 1800  aztreonam (AZACTAM) 1 g in dextrose 5 % 50 mL IVPB     1 g 100 mL/hr over 30 Minutes Intravenous Every 24 hours 07/14/16 1232     07/15/16 1200  vancomycin  (VANCOCIN) IVPB 750 mg/150 ml premix  Status:  Discontinued     750 mg 150 mL/hr over 60 Minutes Intravenous Every T-Th-Sa (Hemodialysis) 07/14/16 1232 07/15/16 1445   07/14/16 1900  vancomycin (VANCOCIN) IVPB 750 mg/150 ml premix  Status:  Discontinued     750 mg 150 mL/hr over 60 Minutes Intravenous Every 24 hours 07/13/16 1740 07/14/16 1232   07/14/16 1800  fluconazole (DIFLUCAN) IVPB 200 mg  Status:  Discontinued     200 mg 100 mL/hr over 60 Minutes Intravenous Every 24 hours 07/13/16 1701 07/14/16 0949   07/14/16 1500  levofloxacin (LEVAQUIN) IVPB 500 mg  Status:  Discontinued     500 mg 100 mL/hr over 60 Minutes Intravenous Every 48 hours 07/13/16 1731 07/14/16 0949   07/13/16 2200  metroNIDAZOLE (FLAGYL) IVPB 500 mg     500 mg 100 mL/hr over 60 Minutes Intravenous Every 8 hours 07/13/16 1732         HPI/Subjective: /o pain right side of abdomen, points actually to lower abdomen, weak, not hungry  Objective: Vitals:   07/15/16 1854 07/15/16 2022 07/16/16 0647 07/16/16 0935  BP: 124/76 116/66 111/63 (!) 102/53  Pulse: 95 96 90 85  Resp: 20 18 18 18   Temp: 97.7 F (36.5 C) 98.7 F (37.1 C) 99.2 F (37.3 C) 98.3 F (36.8 C)  TempSrc: Oral Oral Oral Oral  SpO2: 97% 92% 96% 97%  Weight:  73.5 kg (162 lb)      Intake/Output Summary (Last 24 hours) at 07/16/16 1010 Last data filed at 07/16/16 0213  Gross per 24 hour  Intake              320 ml  Output             3100 ml  Net            -2780 ml    Exam:  Examination:  General exam: Appears calm and comfortable  Respiratory system: Clear to auscultation. Respiratory effort normal. Cardiovascular system: S1 & S2 heard, RRR. No JVD, murmurs, rubs, gallops or clicks. No pedal edema. Gastrointestinal system: Abdomen is nondistended, soft and nontender. No organomegaly or masses felt. Normal bowel sounds heard. Central nervous system: Alert and oriented. No focal neurological deficits. Extremities: Symmetric 5 x 5  power. Skin: No rashes, lesions or ulcers Psychiatry: Judgement and insight appear normal. Mood & affect appropriate.     Data Reviewed: I have personally reviewed following labs and imaging studies  Micro Results Recent Results (from the past 240 hour(s))  Culture, blood (routine x 2)     Status: None (Preliminary result)   Collection Time: 07/13/16  4:20 PM  Result Value Ref Range Status   Specimen Description BLOOD RIGHT WRIST  Final   Special Requests BOTTLES DRAWN AEROBIC AND ANAEROBIC 5CC  Final   Culture NO GROWTH 2 DAYS  Final   Report Status PENDING  Incomplete  Culture, blood (routine x 2)     Status: None (Preliminary result)   Collection Time: 07/13/16  5:04 PM  Result Value Ref Range Status   Specimen Description BLOOD RIGHT HAND BAER  Final   Special Requests 3CC  Final   Culture  Setup Time  Final    GRAM NEGATIVE RODS AEROBIC BOTTLE ONLY CRITICAL RESULT CALLED TO, READ BACK BY AND VERIFIED WITH: M MACCIA,PHARMD AT 1006 07/15/16 BY L BENFIELD    Culture GRAM NEGATIVE RODS  Final   Report Status PENDING  Incomplete  Blood Culture ID Panel (Reflexed)     Status: Abnormal   Collection Time: 07/13/16  5:04 PM  Result Value Ref Range Status   Enterococcus species NOT DETECTED NOT DETECTED Final   Vancomycin resistance NOT DETECTED NOT DETECTED Final   Listeria monocytogenes NOT DETECTED NOT DETECTED Final   Staphylococcus species NOT DETECTED NOT DETECTED Final   Staphylococcus aureus NOT DETECTED NOT DETECTED Final   Methicillin resistance NOT DETECTED NOT DETECTED Final   Streptococcus species NOT DETECTED NOT DETECTED Final   Streptococcus agalactiae NOT DETECTED NOT DETECTED Final   Streptococcus pneumoniae NOT DETECTED NOT DETECTED Final   Streptococcus pyogenes NOT DETECTED NOT DETECTED Final   Acinetobacter baumannii NOT DETECTED NOT DETECTED Final   Enterobacteriaceae species DETECTED (A) NOT DETECTED Final    Comment: CRITICAL RESULT CALLED TO, READ BACK  BY AND VERIFIED WITH: M MACCIA,PHARMD AT 1006 07/15/16 BY L BENFIELD    Enterobacter cloacae complex NOT DETECTED NOT DETECTED Final   Escherichia coli NOT DETECTED NOT DETECTED Final   Klebsiella oxytoca NOT DETECTED NOT DETECTED Final   Klebsiella pneumoniae DETECTED (A) NOT DETECTED Final    Comment: CRITICAL RESULT CALLED TO, READ BACK BY AND VERIFIED WITH: M MACCIA,PHARMD AT 1006 07/15/16 BY L BENFIELD    Proteus species NOT DETECTED NOT DETECTED Final   Serratia marcescens NOT DETECTED NOT DETECTED Final   Carbapenem resistance NOT DETECTED NOT DETECTED Final   Haemophilus influenzae NOT DETECTED NOT DETECTED Final   Neisseria meningitidis NOT DETECTED NOT DETECTED Final   Pseudomonas aeruginosa NOT DETECTED NOT DETECTED Final   Candida albicans NOT DETECTED NOT DETECTED Final   Candida glabrata NOT DETECTED NOT DETECTED Final   Candida krusei NOT DETECTED NOT DETECTED Final   Candida parapsilosis NOT DETECTED NOT DETECTED Final   Candida tropicalis NOT DETECTED NOT DETECTED Final  MRSA PCR Screening     Status: None   Collection Time: 07/13/16  6:31 PM  Result Value Ref Range Status   MRSA by PCR NEGATIVE NEGATIVE Final    Comment:        The GeneXpert MRSA Assay (FDA approved for NASAL specimens only), is one component of a comprehensive MRSA colonization surveillance program. It is not intended to diagnose MRSA infection nor to guide or monitor treatment for MRSA infections.     Radiology Reports Nm Hepatobiliary Liver Func  Result Date: 07/14/2016 CLINICAL DATA:  Right upper quadrant pain. Abnormal sonographic appearance of the gallbladder. EXAM: NUCLEAR MEDICINE HEPATOBILIARY IMAGING TECHNIQUE: Sequential images of the abdomen were obtained out to 120 minutes following intravenous administration of radiopharmaceutical. After IV administered of 3 mg morphine, imaging obtained for an additional 30 minutes. RADIOPHARMACEUTICALS:  5.2  mCi Tc-71m Choletec IV COMPARISON:   Right upper quadrant ultrasound yesterday. FINDINGS: Imaging obtained for 2 hours and 30 minutes. There is mildly delayed hepatic uptake. There is no visualized tracer accumulation within the intrahepatic ducts. At 35 minutes tracer is visualized in the midline, which may be duodenum or common bile duct. There is midline tracer increases in intensity with to and fro trace accumulation proximal and distally likely refluxed into the stomach and passing distally proximal small bowel. After 120 minutes, no gallbladder activity. As small bowel activity  was visualized, 3 mg of morphine was administered. Increased GI tract activity, no gallbladder visualization after morphine. IMPRESSION: Gallbladder not visualized after 2.5 hours as well as the administration of morphine. In addition however the intra and extrahepatic bile ducts are not well seen. Nonvisualization of the gallbladder may be secondary to cholecystitis versus underlying hepatocellular dysfunction (patient bilirubin greater than 3). Limited visualization of the biliary tree activity is likely secondary to hepatic cellular dysfunction. Biliary obstruction is felt much less likely as GI tract activity is observed. Electronically Signed   By: Jeb Levering M.D.   On: 07/14/2016 02:59   Dg Chest Port 1 View  Result Date: 07/13/2016 CLINICAL DATA:  Effusion. Abnormal labs.Hx of CKD, dialysis patient.Ex-smoker, quit ~30 years ago. EXAM: PORTABLE CHEST 1 VIEW COMPARISON:  05/24/2016 FINDINGS: Cardiac silhouette is normal in size. No mediastinal or hilar masses or evidence of adenopathy. There is vascular congestion. Left greater than right pleural effusions are noted. There is some hazy opacity in the lung bases, likely due to atelectasis. No pneumothorax. Right internal jugular dual-lumen central venous catheter is stable and well positioned. IMPRESSION: 1. Small, left greater than right, pleural effusions with associated lung base opacity, most likely  atelectasis. 2. Central vascular congestion without overt pulmonary edema. Electronically Signed   By: Lajean Manes M.D.   On: 07/13/2016 17:27   US Abdomen Limited Ruq  Result Date: 07/13/2016 CLINICAL DATA:  Right upper quadrant pain for 2 days. Nausea and vomiting for 1 day. EXAM: US ABDOMEN LIMITED - RIGHT UPPER QUADRANT COMPARISON:  None. FINDINGS: Gallbladder: The gallbladder is mildly distended measuring 5.2 cm in transverse dimension. This also mild gallbladder wall thickening and gallbladder sludge. No definite gallstones or pericholecystic fluid. Negative sonographic Murphy sign. Common bile duct: Diameter: 4.1 mm Liver: Normal echogenicity without focal lesion or biliary dilatation. Small amount of right upper quadrant ascites. Right pleural effusion noted. IMPRESSION: 1. Distended gallbladder with mild gallbladder wall thickening and gallbladder sludge. Could not exclude acalculous cholecystitis. 2. Normal caliber common bile duct. 3. Normal liver. 4. Small amount of right upper quadrant ascites and right pleural effusion. Electronically Signed   By: Marijo Sanes M.D.   On: 07/13/2016 12:05     CBC  Recent Labs Lab 07/10/16 1019 07/13/16 0810 07/14/16 0300 07/15/16 0222 07/16/16 0744  WBC 6.3 13.6* 48.7* 19.1* 15.6*  HGB 8.9* 8.6* 7.5* 6.8* 8.2*  HCT 26.2* 26.6* 22.1* 19.8* 24.3*  PLT 38* 86* 50* 38* 27*  MCV 89.7 92.1 89.8 88.8 88.0  MCH 30.5 29.9 30.5 30.5 29.7  MCHC 34.0 32.5 33.9 34.3 33.7  RDW 15.1* 16.6* 16.0* 15.9* 16.2*  LYMPHSABS 0.5* 0.2*  --   --  0.2*  MONOABS 0.3 0.7  --   --  0.6  EOSABS 0.1 0.0  --   --  0.0  BASOSABS 0.0 0.0  --   --  0.0    Chemistries   Recent Labs Lab 07/10/16 1019 07/13/16 0810 07/14/16 0300 07/15/16 0222 07/15/16 0840 07/16/16 0744  NA 134* 134* 132* 128*  --  132*  K 3.6 4.2 3.8 3.6  --  3.6  CL  --   --  99* 98*  --  97*  CO2 27 23 21* 20*  --  26  GLUCOSE 125 152* 198* 153*  --  129*  BUN 15.0 28.6* 35* 51*  --  24*   CREATININE 3.4* 3.3* 4.08* 4.26*  --  2.48*  CALCIUM 8.5 8.4 7.2* 7.3*  --  7.4*  MG  --   --  1.2* 2.1  --   --   AST 34 67* 44*  --  30 38  ALT 36 74* 51  --  38 34  ALKPHOS 102 251* 226*  --  187* 298*  BILITOT 0.52 3.43* 5.0*  --  2.9* 3.7*   ------------------------------------------------------------------------------------------------------------------ estimated creatinine clearance is 29.6 mL/min (by C-G formula based on SCr of 2.48 mg/dL). ------------------------------------------------------------------------------------------------------------------ No results for input(s): HGBA1C in the last 72 hours. ------------------------------------------------------------------------------------------------------------------ No results for input(s): CHOL, HDL, LDLCALC, TRIG, CHOLHDL, LDLDIRECT in the last 72 hours. ------------------------------------------------------------------------------------------------------------------ No results for input(s): TSH, T4TOTAL, T3FREE, THYROIDAB in the last 72 hours.  Invalid input(s): FREET3 ------------------------------------------------------------------------------------------------------------------ No results for input(s): VITAMINB12, FOLATE, FERRITIN, TIBC, IRON, RETICCTPCT in the last 72 hours.  Coagulation profile No results for input(s): INR, PROTIME in the last 168 hours.  No results for input(s): DDIMER in the last 72 hours.  Cardiac Enzymes No results for input(s): CKMB, TROPONINI, MYOGLOBIN in the last 168 hours.  Invalid input(s): CK ------------------------------------------------------------------------------------------------------------------ Invalid input(s): POCBNP   CBG: No results for input(s): GLUCAP in the last 168 hours.     Studies: No results found.    No results found for: HGBA1C Lab Results  Component Value Date   CREATININE 2.48 (H) 07/16/2016       Scheduled Meds: . aztreonam  1 g  Intravenous Q24H  . darbepoetin (ARANESP) injection - DIALYSIS  200 mcg Intravenous Q Sat-HD  . famotidine (PEPCID) IV  20 mg Intravenous Q24H  . metronidazole  500 mg Intravenous Q8H  . midodrine  5 mg Oral TID WC   Continuous Infusions: . sodium chloride Stopped (07/15/16 1348)     LOS: 3 days    Time spent: >30 MINS    [facility]  [facility] Pager [phone]. If 7PM-7AM, please contact night-coverage at [email or web address], password [facility] 07/16/2016, 10:10 AM  LOS: 3 days

## 2016-07-16 NOTE — Progress Notes (Signed)
Bladder scan showed 426.  Patient refusing I&O cath.  MD notified.  Will continue to monitor.

## 2016-07-16 NOTE — Progress Notes (Signed)
  Radiation Oncology         [phone] ________________________________  Name: Thomas [name] MRN: [id number]  Date: 07/11/2016  DOB: Mar 01, 1946  End of Treatment Note  Diagnosis:   70 yo man with myeloma and symptomatic involvement of lumbar spine and left femur     Indication for treatment:  Palliation       Radiation treatment dates:   06/28/16-07/11/16  Site/dose:    1.  The lumbar spine centered on L4 to 20 Gy in 10 fractions 2.  The left femur including the implanted rod to 20 Gy in 10 fractions  Beams/energy:    1.  The lumbar spine 15 MV Xrays AP/PA 2.  The left femur 6 and 15 MV Xrays AP/PA  Narrative: The patient tolerated radiation treatment relatively well.  Plan: The patient has completed radiation treatment. The patient will return to radiation oncology clinic for routine followup in one month. I advised him to call or return sooner if he has any questions or concerns related to his recovery or treatment. ________________________________  Sheral Apley. Tammi Klippel, M.D.

## 2016-07-16 NOTE — Consult Note (Signed)
Consultation  Referring Provider: [facility] hospitalist/Dr Abrol Primary Care Physician:  No PCP Per Patient Primary Gastroenterologist:  none  Reason for Consultation:  Abdominal pain/elevated LFT's   HPI: Thomas [name] is a 70 y.o. male  who had been generally healthy until diagnosis of multiple myeloma in May 2017 out to be metastatic to the spine and left femur. He has disease related renal failure as well and is now on hemodialysis. He has been undergoing chemotherapy and radiation under the care of Dr. Alvy Bimler, and had been on Revlimid. He is admitted to the hospital on 07/13/2016 with complaints of abdominal pain nausea and vomiting. Apparently also been having diarrhea last week but that was felt related to chemotherapy. In the emergency room he was hypotensive and had an elevated lactate level at 4.7. This improved but he remained hypotensive and ultimately was managed by critical care and required pressors for hypotension felt related to sepsis. He had elevated LFTs. He was evaluated by surgery after ultrasound showed a distended gallbladder to 5.2 cm with associated mild gallbladder wall thickening there were no stones in common bile duct is 4.1 mm. Dr. Hulen Skains obtained a HIDA scan which shows nonvisualization of the gallbladder. He is not felt to be a good surgical candidate and cholecystostomy tube was recommended for decompression. He has been on broad-spectrum antibiotics. Today on exam by surgery his pain is felt to be more in his right lower abdomen and diagnosis of cholecystitis/acalculous is unclear. He has persistent elevation of LFTs. On admission total bili was 3.4 alkaline phosphatase 251 AST of 67 and ALT 74. Today total bili 3.7 alkaline phosphatase 298 AST 38 and ALT of 34. CBC is trending down. BC +  Gram neg rods His had a significant drop in his platelet count now down to 27,000 today.  He has been trying to eat yesterday and today. He has not had any further nausea and  vomiting. He continues to complain of right-sided abdominal pain and says he hurts in his right upper quadrant a stabbing type pain with movement or coughing. He is also had some pain in the right mid abdomen. Continues to have loose stools 2 bowel movements thus far today.   Past Medical History:  Diagnosis Date  . Cancer ([place])   . Chronic kidney disease   . Compression fracture     Past Surgical History:  Procedure Laterality Date  . AV FISTULA PLACEMENT Left 05/24/2016   Procedure: ARTERIOVENOUS (AV) FISTULA CREATION-LEFT;  Surgeon: Conrad Panora, MD;  Location: [facility];  Service: Vascular;  Laterality: Left;  . BONE BIOPSY Left 05/14/2016   Procedure: LEFT FEMORAL BIOPSY WITH INTRAOPERATIVE FROZEN SECTIONS ;  Surgeon: Rod Can, MD;  Location: [facility];  Service: Orthopedics;  Laterality: Left;  . FEMUR IM NAIL Left 05/14/2016   Procedure: INTRAMEDULLARY (IM) NAIL FEMORAL;  Surgeon: Rod Can, MD;  Location: [facility];  Service: Orthopedics;  Laterality: Left;  . MOUTH SURGERY      Prior to Admission medications   Medication Sig Start Date End Date Taking? Authorizing Provider  acetaminophen (TYLENOL) 325 MG tablet Take 2 tablets (650 mg total) by mouth every 6 (six) hours as needed for mild pain (or Fever >/= 101). 05/29/16  Yes Nishant Dhungel, MD  acyclovir (ZOVIRAX) 200 MG capsule Take 1 capsule (200 mg total) by mouth 2 (two) times daily. 07/03/16  Yes Heath Lark, MD  ALPRAZolam (XANAX) 0.25 MG tablet Take 1 tablet (0.25 mg total) by mouth 2 (  two) times daily as needed for anxiety. 06/08/16  Yes Daniel J Angiulli, PA-C  calcium acetate (PHOSLO) 667 MG capsule Take 1 capsule (667 mg total) by mouth 2 (two) times daily with a meal. 06/08/16  Yes Daniel J Angiulli, PA-C  cyanocobalamin 1000 MCG tablet Take 1 tablet (1,000 mcg total) by mouth daily. 06/08/16  Yes Daniel J Angiulli, PA-C  dexamethasone (DECADRON) 4 MG tablet Take 5 tablets (20 mg total) by mouth once a week. 07/03/16  Yes Heath Lark, MD  diphenoxylate-atropine (LOMOTIL) 2.5-0.025 MG tablet Take 1 tablet by mouth 4 (four) times daily as needed for diarrhea or loose stools. 07/07/16  Yes Heath Lark, MD  lenalidomide (REVLIMID) 2.5 MG capsule Take 1 capsule daily for 14 days, then off 7 days 06/12/16  Yes Heath Lark, MD  loperamide (IMODIUM A-D) 2 MG tablet Take 2 mg by mouth 4 (four) times daily as needed for diarrhea or loose stools.   Yes Historical Provider, MD  loratadine (CLARITIN) 10 MG tablet Take 10 mg by mouth daily as needed for allergies.    Yes Historical Provider, MD  multivitamin (RENA-VIT) TABS tablet Take 1 tablet by mouth at bedtime. 06/08/16  Yes Daniel J Angiulli, PA-C  ondansetron (ZOFRAN) 8 MG tablet Take 1 tablet (8 mg total) by mouth every 8 (eight) hours as needed for nausea. 07/03/16  Yes Heath Lark, MD  pantoprazole (PROTONIX) 40 MG tablet Take 1 tablet (40 mg total) by mouth daily. 06/08/16  Yes Daniel J Angiulli, PA-C  tobramycin (TOBREX) 0.3 % ophthalmic solution Place 1 drop into both eyes every 4 (four) hours. 07/03/16  Yes Heath Lark, MD  triamcinolone (NASACORT AQ) 55 MCG/ACT AERO nasal inhaler Place 2 sprays into the nose daily. 07/03/16  Yes Heath Lark, MD  Vitamin D, Ergocalciferol, (DRISDOL) 50000 units CAPS capsule Take 1 capsule (50,000 Units total) by mouth every 7 (seven) days. 06/08/16  Yes Daniel J Angiulli, PA-C    Current Facility-Administered Medications  Medication Dose Route Frequency Provider Last Rate Last Dose  . 0.9 %  sodium chloride infusion  100 mL Intravenous PRN Corliss Parish, MD      . 0.9 %  sodium chloride infusion  100 mL Intravenous PRN Corliss Parish, MD      . 0.9 %  sodium chloride infusion   Intravenous Continuous Juanito Doom, MD   Stopped at 07/15/16 1348  . alteplase (CATHFLO ACTIVASE) injection 2 mg  2 mg Intracatheter Once PRN Corliss Parish, MD      . aztreonam (AZACTAM) 1 g in dextrose 5 % 50 mL IVPB  1 g Intravenous Q24H Raylene Miyamoto, MD   1 g at 07/15/16 2114  . Darbepoetin Alfa (ARANESP) injection 200 mcg  200 mcg Intravenous Q Sat-HD Corliss Parish, MD   200 mcg at 07/15/16 1633  . famotidine (PEPCID) IVPB 20 mg premix  20 mg Intravenous Q24H Raylene Miyamoto, MD   20 mg at 07/15/16 2115  . lidocaine (PF) (XYLOCAINE) 1 % injection 5 mL  5 mL Intradermal PRN Corliss Parish, MD      . lidocaine-prilocaine (EMLA) cream 1 application  1 application Topical PRN Corliss Parish, MD      . LORazepam (ATIVAN) injection 1 mg  1 mg Intravenous Once Reyne Dumas, MD      . metroNIDAZOLE (FLAGYL) IVPB 500 mg  500 mg Intravenous Q8H Raylene Miyamoto, MD   500 mg at 07/15/16 0845  . midodrine (PROAMATINE) tablet 5 mg  5 mg Oral TID WC Rush Farmer, MD   5 mg at 07/16/16 0859  . pentafluoroprop-tetrafluoroeth (GEBAUERS) aerosol 1 application  1 application Topical PRN Corliss Parish, MD      . technetium TC 62M mebrofenin (CHOLETEC) injection 5 millicurie  5 millicurie Intravenous Once PRN Jeb Levering, MD        Allergies as of 07/13/2016 - Review Complete 07/13/2016  Allergen Reaction Noted  . Penicillins Rash and Other (See Comments) 05/09/2016  . Dilaudid [hydromorphone hcl] Other (See Comments) 05/16/2016  . Codeine Other (See Comments) 05/10/2016    Family History  Problem Relation Age of Onset  . Diabetes Father     Social History   Social History  . Marital status: Married    Spouse name: N/A  . Number of children: N/A  . Years of education: N/A   Occupational History  . Not on file.   Social History Main Topics  . Smoking status: Former Research scientist (life sciences)  . Smokeless tobacco: Never Used  . Alcohol use No  . Drug use: No  . Sexual activity: Not on file   Other Topics Concern  . Not on file   Social History Narrative  . No narrative on file    Review of Systems: Pertinent positive and negative review of systems were noted in the above HPI section.  All other review of  systems was otherwise negative.  Physical Exam: Vital signs in last 24 hours: Temp:  [97.7 F (36.5 C)-99.2 F (37.3 C)] 98.3 F (36.8 C) (08/06 0935) Pulse Rate:  [82-96] 85 (08/06 0935) Resp:  [13-23] 18 (08/06 0935) BP: (98-136)/(53-77) 102/53 (08/06 0935) SpO2:  [92 %-100 %] 97 % (08/06 0935) Weight:  [159 lb 9.8 oz (72.4 kg)-166 lb 10.7 oz (75.6 kg)] 162 lb (73.5 kg) (08/05 2022) Last BM Date: 07/15/16 General:   Alert,  Well-developed,thin,jaundiced, pleasant and cooperative in NAD-family at bedside feeding him lunch  Head:  Normocephalic and atraumatic. Eyes:  Sclera  icterus.   Conjunctiva pink. Ears:  Normal auditory acuity. Nose:  No deformity, discharge,  or lesions. Mouth:  No deformity or lesions.   Neck:  Supple; no masses or thyromegaly. Lungs:  Clear throughout to auscultation.   No wheezes, crackles, or rhonchi. Heart:  Regular rate and rhythm; no murmurs, clicks, rubs,  or gallops. Abdomen:  Soft,tender right upper quadrant and right mid quadrant with guarding no rebound no palpable mass, BS active,nonpalp mass or hsm.   Rectal:  Deferred  Msk:  Symmetrical without gross deformities. . Pulses:  Normal pulses noted. Extremities:  Without clubbing or edema. Neurologic:  Alert and  oriented x4;  grossly normal neurologically. Skin:  Intact without significant lesions or rashes.. Psych:  Alert and cooperative. Normal mood and affect.  Intake/Output from previous day: 08/05 0701 - 08/06 0700 In: 905 [P.O.:420; I.V.:30; IV Piggyback:100] Out: 3100 [Urine:600] Intake/Output this shift: No intake/output data recorded.  Lab Results:  Recent Labs  07/14/16 0300 07/15/16 0222 07/16/16 0744  WBC 48.7* 19.1* 15.6*  HGB 7.5* 6.8* 8.2*  HCT 22.1* 19.8* 24.3*  PLT 50* 38* 27*   BMET  Recent Labs  07/14/16 0300 07/15/16 0222 07/16/16 0744  NA 132* 128* 132*  K 3.8 3.6 3.6  CL 99* 98* 97*  CO2 21* 20* 26  GLUCOSE 198* 153* 129*  BUN 35* 51* 24*    CREATININE 4.08* 4.26* 2.48*  CALCIUM 7.2* 7.3* 7.4*   LFT  Recent Labs  07/15/16 0840 07/16/16 0744  PROT 4.7* 4.8*  ALBUMIN 1.8* 1.7*  AST 30 38  ALT 38 34  ALKPHOS 187* 298*  BILITOT 2.9* 3.7*  BILIDIR 1.9*  --   IBILI 1.0*  --    PT/INR No results for input(s): LABPROT, INR in the last 72 hours. Hepatitis Panel No results for input(s): HEPBSAG, HCVAB, HEPAIGM, HEPBIGM in the last 72 hours.   IMPRESSION:   #59 70 year old white male diagnosed with multiple myeloma May 2017 metastatic to the spine and left femur also associated with renal failure now on hemodialysis Patient has been undergoing chemotherapy/on Revlimid, and just completed radiation. Admitted on 07/13/2016 with 2 day history of nausea vomiting and right-sided abdominal pain. Patient had also been having diarrhea for several days felt chemotherapy related. Patient hypotensive on admission with elevated LFTs and required pressors initially for sepsis. Blood cultures are positive for gram-negative rods ,sepsis is resolving. Etiology of right-sided abdominal pain nausea vomiting elevated LFTs is suspected secondary to  acalculus cholecystitis with positive HIDA scan. He currently has right upper and right mid abdominal pain.  #2 severe thrombocytopenia-secondary to chemotherapy #3 renal failure-had dialysis yesterday   Plan; Agree that MRI of abdomen and MRCP indicated, and ordered stat for this afternoon Nothing by mouth Patient currently being covered with aztreonam and metronidazole. GI will follow up on MRI, and make further recommendations    Guiliana Shor  07/16/2016, 12:17 PM

## 2016-07-17 ENCOUNTER — Encounter (HOSPITAL_COMMUNITY): Payer: Self-pay | Admitting: Interventional Radiology

## 2016-07-17 ENCOUNTER — Inpatient Hospital Stay (HOSPITAL_COMMUNITY): Payer: PPO

## 2016-07-17 DIAGNOSIS — R1013 Epigastric pain: Secondary | ICD-10-CM

## 2016-07-17 DIAGNOSIS — K819 Cholecystitis, unspecified: Secondary | ICD-10-CM

## 2016-07-17 HISTORY — PX: IR GENERIC HISTORICAL: IMG1180011

## 2016-07-17 LAB — COMPREHENSIVE METABOLIC PANEL
ALBUMIN: 1.7 g/dL — AB (ref 3.5–5.0)
ALT: 25 U/L (ref 17–63)
ANION GAP: 8 (ref 5–15)
AST: 20 U/L (ref 15–41)
Alkaline Phosphatase: 295 U/L — ABNORMAL HIGH (ref 38–126)
BILIRUBIN TOTAL: 3.5 mg/dL — AB (ref 0.3–1.2)
BUN: 35 mg/dL — ABNORMAL HIGH (ref 6–20)
CO2: 26 mmol/L (ref 22–32)
Calcium: 7.6 mg/dL — ABNORMAL LOW (ref 8.9–10.3)
Chloride: 99 mmol/L — ABNORMAL LOW (ref 101–111)
Creatinine, Ser: 3.02 mg/dL — ABNORMAL HIGH (ref 0.61–1.24)
GFR calc non Af Amer: 20 mL/min — ABNORMAL LOW (ref >60)
GFR, EST AFRICAN AMERICAN: 23 mL/min — AB (ref >60)
GLUCOSE: 142 mg/dL — AB (ref 65–99)
POTASSIUM: 3.6 mmol/L (ref 3.5–5.1)
SODIUM: 133 mmol/L — AB (ref 135–145)
TOTAL PROTEIN: 4.7 g/dL — AB (ref 6.5–8.1)

## 2016-07-17 LAB — CBC
HCT: 24 % — ABNORMAL LOW (ref 39.0–52.0)
HEMATOCRIT: 24.6 % — AB (ref 39.0–52.0)
HEMOGLOBIN: 8 g/dL — AB (ref 13.0–17.0)
Hemoglobin: 8.2 g/dL — ABNORMAL LOW (ref 13.0–17.0)
MCH: 29.6 pg (ref 26.0–34.0)
MCH: 29.6 pg (ref 26.0–34.0)
MCHC: 33.3 g/dL (ref 30.0–36.0)
MCHC: 33.3 g/dL (ref 30.0–36.0)
MCV: 88.8 fL (ref 78.0–100.0)
MCV: 88.9 fL (ref 78.0–100.0)
Platelets: 19 10*3/uL — CL (ref 150–400)
Platelets: 43 10*3/uL — ABNORMAL LOW (ref 150–400)
RBC: 2.77 MIL/uL — ABNORMAL LOW (ref 4.22–5.81)
RBC: 2.7 MIL/uL — AB (ref 4.22–5.81)
RDW: 16 % — AB (ref 11.5–15.5)
RDW: 16.1 % — ABNORMAL HIGH (ref 11.5–15.5)
WBC: 10.2 10*3/uL (ref 4.0–10.5)
WBC: 8.7 10*3/uL (ref 4.0–10.5)

## 2016-07-17 LAB — CULTURE, BLOOD (ROUTINE X 2)

## 2016-07-17 LAB — APTT: APTT: 31 s (ref 24–36)

## 2016-07-17 LAB — HEPARIN INDUCED PLATELET AB (HIT ANTIBODY): Heparin Induced Plt Ab: 0.07 OD (ref 0.000–0.400)

## 2016-07-17 LAB — PROTIME-INR
INR: 1.65
Prothrombin Time: 19.7 seconds — ABNORMAL HIGH (ref 11.4–15.2)

## 2016-07-17 MED ORDER — MIDAZOLAM HCL 2 MG/2ML IJ SOLN
INTRAMUSCULAR | Status: AC | PRN
  Administered 2016-07-17: 1 mg via INTRAVENOUS

## 2016-07-17 MED ORDER — FENTANYL CITRATE (PF) 100 MCG/2ML IJ SOLN
INTRAMUSCULAR | Status: AC
  Filled 2016-07-17: qty 2

## 2016-07-17 MED ORDER — VANCOMYCIN HCL IN DEXTROSE 1-5 GM/200ML-% IV SOLN
1000.0000 mg | INTRAVENOUS | Status: DC
  Filled 2016-07-17: qty 200

## 2016-07-17 MED ORDER — IOPAMIDOL (ISOVUE-300) INJECTION 61%
INTRAVENOUS | Status: AC
  Administered 2016-07-17: 10 mL
  Filled 2016-07-17: qty 50

## 2016-07-17 MED ORDER — SODIUM CHLORIDE 0.9 % IV SOLN: Freq: Once | INTRAVENOUS | Status: DC

## 2016-07-17 MED ORDER — MIDAZOLAM HCL 2 MG/2ML IJ SOLN
INTRAMUSCULAR | Status: AC
  Filled 2016-07-17: qty 2

## 2016-07-17 MED ORDER — LIDOCAINE HCL 1 % IJ SOLN
INTRAMUSCULAR | Status: AC
  Filled 2016-07-17: qty 20

## 2016-07-17 MED ORDER — FENTANYL CITRATE (PF) 100 MCG/2ML IJ SOLN
INTRAMUSCULAR | Status: AC | PRN
  Administered 2016-07-17: 50 ug via INTRAVENOUS

## 2016-07-17 MED ORDER — LIDOCAINE HCL 1 % IJ SOLN
INTRAMUSCULAR | Status: AC | PRN
  Administered 2016-07-17: 10 mL

## 2016-07-17 NOTE — Sedation Documentation (Signed)
Vital signs stable. Pt is sleeping 

## 2016-07-17 NOTE — Care Management Important Message (Signed)
Important Message  Patient Details  Name: Thomas [name] MRN: [id number] Date of Birth: 1946/04/07   Medicare Important Message Given:  Yes    [name] 07/17/2016, 2:01 PM

## 2016-07-17 NOTE — Progress Notes (Signed)
Thomas [name]   DOB:Sep 19, 1946   JG#:[id number]    Subjective: The patient is seen in his room along with his wife. He continues to have intermittent right upper quadrant pain but the patient does not think is bad enough to warrant narcotic prescription. He is noted to have urinary retention requiring in and out catheterization. He received hemodialysis over the weekend. He is started on broad-spectrum IV antibiotics. He was found out of the ICU to regular floor over the weekend. The patient denies any recent signs or symptoms of bleeding such as spontaneous epistaxis, hematuria or hematochezia.   Objective:  Vitals:   07/17/16 0555 07/17/16 0900  BP: 119/62 132/71  Pulse: 94 95  Resp: 17 18  Temp: 99.6 F (37.6 C) 98.5 F (36.9 C)     Intake/Output Summary (Last 24 hours) at 07/17/16 1018 Last data filed at 07/17/16 0830  Gross per 24 hour  Intake             1000 ml  Output              751 ml  Net              249 ml    GENERAL:alert, no distress and comfortable SKIN: skin color is pale, texture, turgor are normal, no rashes or significant lesions EYES: normal, Conjunctiva are pale and non-injected, sclera clear Musculoskeletal:no cyanosis of digits and no clubbing  NEURO: alert & oriented x 3 with fluent speech, no focal motor/sensory deficits   Labs:  Lab Results  Component Value Date   WBC 10.2 07/17/2016   HGB 8.2 (L) 07/17/2016   HCT 24.6 (L) 07/17/2016   MCV 88.8 07/17/2016   PLT 19 (LL) 07/17/2016   NEUTROABS 14.8 (H) 07/16/2016    Lab Results  Component Value Date   NA 133 (L) 07/17/2016   K 3.6 07/17/2016   CL 99 (L) 07/17/2016   CO2 26 07/17/2016    Studies:  Mr Abdomen Mrcp Wo Cm  Result Date: 07/17/2016 CLINICAL DATA:  Right upper quadrant pain. Elevated liver function tests. History of multiple myeloma. EXAM: MRI ABDOMEN WITHOUT CONTRAST  (INCLUDING MRCP) TECHNIQUE: Multiplanar multisequence MR imaging of the abdomen was performed. Heavily T2-weighted  images of the biliary and pancreatic ducts were obtained, and three-dimensional MRCP images were rendered by post processing. COMPARISON:  Ultrasound or nuclear medicine hepatobiliary study of 07/13/2016. FINDINGS: Mild to moderate motion degradation throughout. Lower chest: Left larger than right pleural effusions with bibasilar airspace disease. Normal heart size without pericardial or pleural effusion. Hepatobiliary: No focal liver lesion. No intrahepatic biliary duct dilatation. Gallbladder sludge with mild gallbladder distension. Gallbladder wall thickening including at 10 mm on image 27/series 4. Mild pericholecystic fluid, including on image 25/series 5. No evidence of common duct stone. Pancreas: Mild pancreatic atrophy, without duct dilatation or mass. Limited evaluation for acute pancreatitis, secondary to relatively diffuse abdominal edema. Spleen: Mild splenomegaly, 14.0 cm craniocaudal. Adrenals/Urinary Tract: Normal adrenal glands. Bilateral renal lesions which are likely cysts. No hydronephrosis. Stomach/Bowel: Normal stomach and abdominal small bowel loops. There is wall thickening of and edema within both the ascending and descending colon, including on images 21/series 3 and 44/series 5. Vascular/Lymphatic: Normal aortic caliber. No retroperitoneal or retrocrural adenopathy. Other: Small volume ascites.  Diffuse anasarca. Musculoskeletal: Heterogeneous marrow signal with is foci of T2 hyperintensity within the lower lumbar and lower thoracic spine. IMPRESSION: 1. Motion degraded exam. 2. No biliary duct dilatation or choledocholithiasis. 3. Gallbladder distension with wall  thickening and pericholecystic fluid. Concurrent gallbladder sludge. Findings are suspicious for acute cholecystitis. 4. Bilateral pleural effusions and ascites, suggesting fluid overload. 5. Heterogeneous marrow signal with foci of T2 hyperintensity within. Likely related to the clinical history of multiple myeloma. 6. Multi  focal colitis.  Distribution favors infection. Electronically Signed   By: Abigail Miyamoto M.D.   On: 07/17/2016 07:55   Mr 3d Recon At Scanner  Result Date: 07/17/2016 CLINICAL DATA:  Right upper quadrant pain. Elevated liver function tests. History of multiple myeloma. EXAM: MRI ABDOMEN WITHOUT CONTRAST  (INCLUDING MRCP) TECHNIQUE: Multiplanar multisequence MR imaging of the abdomen was performed. Heavily T2-weighted images of the biliary and pancreatic ducts were obtained, and three-dimensional MRCP images were rendered by post processing. COMPARISON:  Ultrasound or nuclear medicine hepatobiliary study of 07/13/2016. FINDINGS: Mild to moderate motion degradation throughout. Lower chest: Left larger than right pleural effusions with bibasilar airspace disease. Normal heart size without pericardial or pleural effusion. Hepatobiliary: No focal liver lesion. No intrahepatic biliary duct dilatation. Gallbladder sludge with mild gallbladder distension. Gallbladder wall thickening including at 10 mm on image 27/series 4. Mild pericholecystic fluid, including on image 25/series 5. No evidence of common duct stone. Pancreas: Mild pancreatic atrophy, without duct dilatation or mass. Limited evaluation for acute pancreatitis, secondary to relatively diffuse abdominal edema. Spleen: Mild splenomegaly, 14.0 cm craniocaudal. Adrenals/Urinary Tract: Normal adrenal glands. Bilateral renal lesions which are likely cysts. No hydronephrosis. Stomach/Bowel: Normal stomach and abdominal small bowel loops. There is wall thickening of and edema within both the ascending and descending colon, including on images 21/series 3 and 44/series 5. Vascular/Lymphatic: Normal aortic caliber. No retroperitoneal or retrocrural adenopathy. Other: Small volume ascites.  Diffuse anasarca. Musculoskeletal: Heterogeneous marrow signal with is foci of T2 hyperintensity within the lower lumbar and lower thoracic spine. IMPRESSION: 1. Motion degraded  exam. 2. No biliary duct dilatation or choledocholithiasis. 3. Gallbladder distension with wall thickening and pericholecystic fluid. Concurrent gallbladder sludge. Findings are suspicious for acute cholecystitis. 4. Bilateral pleural effusions and ascites, suggesting fluid overload. 5. Heterogeneous marrow signal with foci of T2 hyperintensity within. Likely related to the clinical history of multiple myeloma. 6. Multi focal colitis.  Distribution favors infection. Electronically Signed   By: Abigail Miyamoto M.D.   On: 07/17/2016 07:55    Assessment & Plan:   Multiple myeloma not having achieved remission ([place]) I review his recent test results with him and family. Serum light chain has dropped significantly to near normal ratio Due to recent pancytopenia, treatment is placed on hold Continue aggressive supportive care Of note, his recent chemotherapy is not the cause of the acute cholecystitis  Antineoplastic chemotherapy induced pancytopenia ([facility]) Pancytopenia has improved since discontinuation of Revlimid. He is becoming more pancytopenic again especially with thrombocytopenia likely due to side effects of antibiotic therapy. The patient denies any recent signs or symptoms of bleeding such as spontaneous epistaxis, hematuria or hematochezia. The anemia is related to chronic renal failure. He is getting ESA through dialysis center I will defer to primary service to decide regarding transfusion threshold He does not need platelet transfusion unless is less than 10 or signs of bleeding. If invasive procedure is needed, I recommend platelet transfusion before and after surgery  N&V (nausea and vomiting), resolved He had received IV fluids and antiemetics  Abdominal pain, resolved Leukocytosis with Elevated LFT He is currently on broad-spectrum IV antibiotics MRCP suggested possible acute pancreatitis. 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

## 2016-07-17 NOTE — Consult Note (Signed)
Chief Complaint: Patient was seen in consultation today for percutaneous cholecystomy drain placement Chief Complaint  Patient presents with  . Abdominal Pain  . abnormal labs  . Leg Pain   at the request of Dr Reyne Dumas  Referring Physician(s): Dr Greer Pickerel  Supervising Physician: Arne Cleveland  Patient Status: Inpatient  History of Present Illness: Thomas [name] is a 70 y.o. 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 In addition however the intra and extrahepatic bile ducts are not well seen. Nonvisualization of the gallbladder may be secondary to cholecystitis versus underlying hepatocellular dysfunction (patient bilirubin greater than 3). MRCP 8/7: IMPRESSION: 1. Motion degraded exam. 2. No biliary duct dilatation or choledocholithiasis. 3. Gallbladder distension with wall thickening and pericholecystic fluid. Concurrent gallbladder sludge. Findings are suspicious for acute cholecystitis. 4. Bilateral pleural effusions and ascites, suggesting fluid overload. 5. Heterogeneous marrow signal with foci of T2 hyperintensity within. Likely related to the clinical history of multiple myeloma. 6. Multi focal colitis.  Distribution favors infection.  Plt 19 today transfusing x 2 now  Request for percutaneous cholecystostomy drain per Dr Redmond Pulling Approved with Dr Vernard Gambles  Past Medical History:  Diagnosis Date  . Cancer ([place])   . Chronic kidney disease   . Compression fracture     Past Surgical History:  Procedure Laterality Date  . AV FISTULA PLACEMENT Left 05/24/2016   Procedure: ARTERIOVENOUS (AV) FISTULA CREATION-LEFT;  Surgeon: Conrad Windsor, MD;  Location: [place];  Service:  Vascular;  Laterality: Left;  . BONE BIOPSY Left 05/14/2016   Procedure: LEFT FEMORAL BIOPSY WITH INTRAOPERATIVE FROZEN SECTIONS ;  Surgeon: Rod Can, MD;  Location: [place];  Service: Orthopedics;  Laterality: Left;  . FEMUR IM NAIL Left 05/14/2016   Procedure: INTRAMEDULLARY (IM) NAIL FEMORAL;  Surgeon: Rod Can, MD;  Location: [place];  Service: Orthopedics;  Laterality: Left;  . MOUTH SURGERY      Allergies: Penicillins; Dilaudid [hydromorphone hcl]; Heparin; and Codeine  Medications: Prior to Admission medications   Medication Sig Start Date End Date Taking? Authorizing Provider  acetaminophen (TYLENOL) 325 MG tablet Take 2 tablets (650 mg total) by mouth every 6 (six) hours as needed for mild pain (or Fever >/= 101). 05/29/16  Yes Nishant Dhungel, MD  acyclovir (ZOVIRAX) 200 MG capsule Take 1 capsule (200 mg total) by mouth 2 (two) times daily. 07/03/16  Yes Heath Lark, MD  ALPRAZolam (XANAX) 0.25 MG tablet Take 1 tablet (0.25 mg total) by mouth 2 (two) times daily as needed for anxiety. 06/08/16  Yes Daniel J Angiulli, PA-C  calcium acetate (PHOSLO) 667 MG capsule Take 1 capsule (667 mg total) by mouth 2 (two) times daily with a meal. 06/08/16  Yes Daniel J Angiulli, PA-C  cyanocobalamin 1000 MCG tablet Take 1 tablet (1,000 mcg total) by mouth daily. 06/08/16  Yes Daniel J Angiulli, PA-C  dexamethasone (DECADRON) 4 MG tablet Take 5 tablets (20 mg total) by mouth once a week. 07/03/16  Yes Heath Lark, MD  diphenoxylate-atropine (LOMOTIL) 2.5-0.025 MG tablet Take 1 tablet by mouth 4 (four) times daily as needed for diarrhea or loose stools. 07/07/16  Yes Heath Lark, MD  lenalidomide (REVLIMID) 2.5 MG capsule Take 1 capsule daily for 14 days, then off 7 days  06/12/16  Yes Heath Lark, MD  loperamide (IMODIUM A-D) 2 MG tablet Take 2 mg by mouth 4 (four) times daily as needed for diarrhea or loose stools.   Yes Historical Provider, MD  loratadine (CLARITIN) 10 MG tablet Take 10 mg by mouth daily  as needed for allergies.    Yes Historical Provider, MD  multivitamin (RENA-VIT) TABS tablet Take 1 tablet by mouth at bedtime. 06/08/16  Yes Daniel J Angiulli, PA-C  ondansetron (ZOFRAN) 8 MG tablet Take 1 tablet (8 mg total) by mouth every 8 (eight) hours as needed for nausea. 07/03/16  Yes Heath Lark, MD  pantoprazole (PROTONIX) 40 MG tablet Take 1 tablet (40 mg total) by mouth daily. 06/08/16  Yes Daniel J Angiulli, PA-C  tobramycin (TOBREX) 0.3 % ophthalmic solution Place 1 drop into both eyes every 4 (four) hours. 07/03/16  Yes Heath Lark, MD  triamcinolone (NASACORT AQ) 55 MCG/ACT AERO nasal inhaler Place 2 sprays into the nose daily. 07/03/16  Yes Heath Lark, MD  Vitamin D, Ergocalciferol, (DRISDOL) 50000 units CAPS capsule Take 1 capsule (50,000 Units total) by mouth every 7 (seven) days. 06/08/16  Yes Lavon Paganini Angiulli, PA-C     Family History  Problem Relation Age of Onset  . Diabetes Father     Social History   Social History  . Marital status: Married    Spouse name: N/A  . Number of children: N/A  . Years of education: N/A   Social History Main Topics  . Smoking status: Former Research scientist (life sciences)  . Smokeless tobacco: Never Used  . Alcohol use No  . Drug use: No  . Sexual activity: Not Asked   Other Topics Concern  . None   Social History Narrative  . None     Review of Systems: A 12 point ROS discussed and pertinent positives are indicated in the HPI above.  All other systems are negative.  Review of Systems  Constitutional: Positive for activity change and appetite change. Negative for fever.  Gastrointestinal: Positive for abdominal pain, diarrhea and nausea.  Neurological: Positive for weakness.  Psychiatric/Behavioral: Negative for behavioral problems and confusion.    Vital Signs: BP 132/71 (BP Location: Right Arm)   Pulse 95   Temp 98.5 F (36.9 C) (Oral)   Resp 18   Wt 167 lb (75.8 kg)   SpO2 96%   BMI 22.03 kg/m   Physical Exam  Constitutional: He is  oriented to person, place, and time.  Thin/frail  Cardiovascular: Normal rate and regular rhythm.   Pulmonary/Chest: Effort normal and breath sounds normal.  Abdominal: Soft. There is tenderness.  Musculoskeletal: Normal range of motion.  Neurological: He is alert and oriented to person, place, and time.  Skin: Skin is warm and dry.  Psychiatric: He has a normal mood and affect. His behavior is normal. Judgment and thought content normal.  Nursing note and vitals reviewed.   Mallampati Score:  MD Evaluation Airway: WNL Heart: WNL Abdomen: WNL Chest/ Lungs: WNL ASA  Classification: 3 Mallampati/Airway Score: One  Imaging: Nm Hepatobiliary Liver Func  Result Date: 07/14/2016 CLINICAL DATA:  Right upper quadrant pain. Abnormal sonographic appearance of the gallbladder. EXAM: NUCLEAR MEDICINE HEPATOBILIARY IMAGING TECHNIQUE: Sequential images of the abdomen were obtained out to 120 minutes following intravenous administration of radiopharmaceutical. After IV administered of 3 mg morphine, imaging obtained for an additional 30 minutes. RADIOPHARMACEUTICALS:  5.2  mCi Tc-25m Choletec IV COMPARISON:  Right upper quadrant ultrasound yesterday. FINDINGS: Imaging obtained for 2 hours  and 30 minutes. There is mildly delayed hepatic uptake. There is no visualized tracer accumulation within the intrahepatic ducts. At 35 minutes tracer is visualized in the midline, which may be duodenum or common bile duct. There is midline tracer increases in intensity with to and fro trace accumulation proximal and distally likely refluxed into the stomach and passing distally proximal small bowel. After 120 minutes, no gallbladder activity. As small bowel activity was visualized, 3 mg of morphine was administered. Increased GI tract activity, no gallbladder visualization after morphine. IMPRESSION: Gallbladder not visualized after 2.5 hours as well as the administration of morphine. In addition however the intra and  extrahepatic bile ducts are not well seen. Nonvisualization of the gallbladder may be secondary to cholecystitis versus underlying hepatocellular dysfunction (patient bilirubin greater than 3). Limited visualization of the biliary tree activity is likely secondary to hepatic cellular dysfunction. Biliary obstruction is felt much less likely as GI tract activity is observed. Electronically Signed   By: Jeb Levering M.D.   On: 07/14/2016 02:59   Mr Abdomen Mrcp Wo Cm  Result Date: 07/17/2016 CLINICAL DATA:  Right upper quadrant pain. Elevated liver function tests. History of multiple myeloma. EXAM: MRI ABDOMEN WITHOUT CONTRAST  (INCLUDING MRCP) TECHNIQUE: Multiplanar multisequence MR imaging of the abdomen was performed. Heavily T2-weighted images of the biliary and pancreatic ducts were obtained, and three-dimensional MRCP images were rendered by post processing. COMPARISON:  Ultrasound or nuclear medicine hepatobiliary study of 07/13/2016. FINDINGS: Mild to moderate motion degradation throughout. Lower chest: Left larger than right pleural effusions with bibasilar airspace disease. Normal heart size without pericardial or pleural effusion. Hepatobiliary: No focal liver lesion. No intrahepatic biliary duct dilatation. Gallbladder sludge with mild gallbladder distension. Gallbladder wall thickening including at 10 mm on image 27/series 4. Mild pericholecystic fluid, including on image 25/series 5. No evidence of common duct stone. Pancreas: Mild pancreatic atrophy, without duct dilatation or mass. Limited evaluation for acute pancreatitis, secondary to relatively diffuse abdominal edema. Spleen: Mild splenomegaly, 14.0 cm craniocaudal. Adrenals/Urinary Tract: Normal adrenal glands. Bilateral renal lesions which are likely cysts. No hydronephrosis. Stomach/Bowel: Normal stomach and abdominal small bowel loops. There is wall thickening of and edema within both the ascending and descending colon, including on  images 21/series 3 and 44/series 5. Vascular/Lymphatic: Normal aortic caliber. No retroperitoneal or retrocrural adenopathy. Other: Small volume ascites.  Diffuse anasarca. Musculoskeletal: Heterogeneous marrow signal with is foci of T2 hyperintensity within the lower lumbar and lower thoracic spine. IMPRESSION: 1. Motion degraded exam. 2. No biliary duct dilatation or choledocholithiasis. 3. Gallbladder distension with wall thickening and pericholecystic fluid. Concurrent gallbladder sludge. Findings are suspicious for acute cholecystitis. 4. Bilateral pleural effusions and ascites, suggesting fluid overload. 5. Heterogeneous marrow signal with foci of T2 hyperintensity within. Likely related to the clinical history of multiple myeloma. 6. Multi focal colitis.  Distribution favors infection. Electronically Signed   By: Abigail Miyamoto M.D.   On: 07/17/2016 07:55   Mr 3d Recon At Scanner  Result Date: 07/17/2016 CLINICAL DATA:  Right upper quadrant pain. Elevated liver function tests. History of multiple myeloma. EXAM: MRI ABDOMEN WITHOUT CONTRAST  (INCLUDING MRCP) TECHNIQUE: Multiplanar multisequence MR imaging of the abdomen was performed. Heavily T2-weighted images of the biliary and pancreatic ducts were obtained, and three-dimensional MRCP images were rendered by post processing. COMPARISON:  Ultrasound or nuclear medicine hepatobiliary study of 07/13/2016. FINDINGS: Mild to moderate motion degradation throughout. Lower chest: Left larger than right pleural effusions with bibasilar airspace disease. Normal heart size  without pericardial or pleural effusion. Hepatobiliary: No focal liver lesion. No intrahepatic biliary duct dilatation. Gallbladder sludge with mild gallbladder distension. Gallbladder wall thickening including at 10 mm on image 27/series 4. Mild pericholecystic fluid, including on image 25/series 5. No evidence of common duct stone. Pancreas: Mild pancreatic atrophy, without duct dilatation or  mass. Limited evaluation for acute pancreatitis, secondary to relatively diffuse abdominal edema. Spleen: Mild splenomegaly, 14.0 cm craniocaudal. Adrenals/Urinary Tract: Normal adrenal glands. Bilateral renal lesions which are likely cysts. No hydronephrosis. Stomach/Bowel: Normal stomach and abdominal small bowel loops. There is wall thickening of and edema within both the ascending and descending colon, including on images 21/series 3 and 44/series 5. Vascular/Lymphatic: Normal aortic caliber. No retroperitoneal or retrocrural adenopathy. Other: Small volume ascites.  Diffuse anasarca. Musculoskeletal: Heterogeneous marrow signal with is foci of T2 hyperintensity within the lower lumbar and lower thoracic spine. IMPRESSION: 1. Motion degraded exam. 2. No biliary duct dilatation or choledocholithiasis. 3. Gallbladder distension with wall thickening and pericholecystic fluid. Concurrent gallbladder sludge. Findings are suspicious for acute cholecystitis. 4. Bilateral pleural effusions and ascites, suggesting fluid overload. 5. Heterogeneous marrow signal with foci of T2 hyperintensity within. Likely related to the clinical history of multiple myeloma. 6. Multi focal colitis.  Distribution favors infection. Electronically Signed   By: Abigail Miyamoto M.D.   On: 07/17/2016 07:55   Dg Chest Port 1 View  Result Date: 07/13/2016 CLINICAL DATA:  Effusion. Abnormal labs.Hx of CKD, dialysis patient.Ex-smoker, quit ~30 years ago. EXAM: PORTABLE CHEST 1 VIEW COMPARISON:  05/24/2016 FINDINGS: Cardiac silhouette is normal in size. No mediastinal or hilar masses or evidence of adenopathy. There is vascular congestion. Left greater than right pleural effusions are noted. There is some hazy opacity in the lung bases, likely due to atelectasis. No pneumothorax. Right internal jugular dual-lumen central venous catheter is stable and well positioned. IMPRESSION: 1. Small, left greater than right, pleural effusions with associated  lung base opacity, most likely atelectasis. 2. Central vascular congestion without overt pulmonary edema. Electronically Signed   By: Lajean Manes M.D.   On: 07/13/2016 17:27   US Abdomen Limited Ruq  Result Date: 07/13/2016 CLINICAL DATA:  Right upper quadrant pain for 2 days. Nausea and vomiting for 1 day. EXAM: US ABDOMEN LIMITED - RIGHT UPPER QUADRANT COMPARISON:  None. FINDINGS: Gallbladder: The gallbladder is mildly distended measuring 5.2 cm in transverse dimension. This also mild gallbladder wall thickening and gallbladder sludge. No definite gallstones or pericholecystic fluid. Negative sonographic Murphy sign. Common bile duct: Diameter: 4.1 mm Liver: Normal echogenicity without focal lesion or biliary dilatation. Small amount of right upper quadrant ascites. Right pleural effusion noted. IMPRESSION: 1. Distended gallbladder with mild gallbladder wall thickening and gallbladder sludge. Could not exclude acalculous cholecystitis. 2. Normal caliber common bile duct. 3. Normal liver. 4. Small amount of right upper quadrant ascites and right pleural effusion. Electronically Signed   By: Marijo Sanes M.D.   On: 07/13/2016 12:05    Labs:  CBC:  Recent Labs  07/14/16 0300 07/15/16 0222 07/16/16 0744 07/17/16 0504  WBC 48.7* 19.1* 15.6* 10.2  HGB 7.5* 6.8* 8.2* 8.2*  HCT 22.1* 19.8* 24.3* 24.6*  PLT 50* 38* 27* 19*    COAGS:  Recent Labs  05/10/16 1156 05/11/16 0500  INR 1.40 1.46  APTT 33 31    BMP:  Recent Labs  07/14/16 0300 07/15/16 0222 07/16/16 0744 07/17/16 0504  NA 132* 128* 132* 133*  K 3.8 3.6 3.6 3.6  CL 99* 98*  97* 99*  CO2 21* 20* 26 26  GLUCOSE 198* 153* 129* 142*  BUN 35* 51* 24* 35*  CALCIUM 7.2* 7.3* 7.4* 7.6*  CREATININE 4.08* 4.26* 2.48* 3.02*  GFRNONAA 14* 13* 25* 20*  GFRAA 16* 15* 29* 23*    LIVER FUNCTION TESTS:  Recent Labs  07/14/16 0300 07/15/16 0222 07/15/16 0840 07/16/16 0744 07/17/16 0504  BILITOT 5.0*  --  2.9* 3.7* 3.5*    AST 44*  --  30 38 20  ALT 51  --  38 34 25  ALKPHOS 226*  --  187* 298* 295*  PROT 5.3*  --  4.7* 4.8* 4.7*  ALBUMIN 1.9* 1.7* 1.8* 1.7* 1.7*    TUMOR MARKERS: No results for input(s): AFPTM, CEA, CA199, CHROMGRNA in the last 8760 hours.  Assessment and Plan:  RLQ pain Sepsis Thrombocytopenia Cholecystitis per US/MRCP and [facility] + Scheduled now for percutaneous cholecystostomy drain placement Risks and Benefits discussed with the patient including, but not limited to bleeding, infection, gallbladder perforation, bile leak, sepsis or even death. All of the patient's questions were answered, patient is agreeable to proceed. Consent signed and in chart.  Transfusing Plt  (x2)now per MD  Thank you for this interesting consult.  I greatly enjoyed meeting Thomas [name] and look forward to participating in their care.  A copy of this report was sent to the requesting provider on this date.  Electronically Signed: Jol[place] Mallozzi A 07/17/2016, 9:43 AM   I spent a total of 40 Minutes    in face to face in clinical consultation, greater than 50% of which was counseling/coordinating care for perc chole drain

## 2016-07-17 NOTE — Progress Notes (Signed)
MEDICATION RELATED CONSULT NOTE - INITIAL   Pharmacy Consult for  Indication:   Allergies  Allergen Reactions  . Penicillins Rash and Other (See Comments)    Has patient had a PCN reaction causing immediate rash, facial/tongue/throat swelling, SOB or lightheadedness with hypotension: YES + Reaction causing SEVERE RASH involving MUCUS MEMBRANES or SKIN NECROSIS >> YES Reaction that required hospitalization: NO Reaction occurring within the last 10 years: NO If all of the above answers are "NO", then may proceed with Cephalosporin use.   . Dilaudid [Hydromorphone Hcl] Other (See Comments)    hallucinations  . Fish Allergy Nausea And Vomiting  . Heparin     HIT panel/SRA pending  . Codeine Other (See Comments)    "MAKES ME JUMPY"    Patient Measurements: Weight: 167 lb (75.8 kg) Adjusted Body Weight:   Vital Signs: Temp: 98.1 F (36.7 C) (08/07 1545) Temp Source: Oral (08/07 1445) BP: 132/65 (08/07 1615) Pulse Rate: 88 (08/07 1619) Intake/Output from previous day: 08/06 0701 - 08/07 0700 In: 1120 [P.O.:720; IV Piggyback:400] Out: 750 [Urine:750] Intake/Output from this shift: Total I/O In: 601 [Blood:601] Out: 251 [Urine:250; Stool:1]  Labs:  Recent Labs  07/15/16 0222 07/15/16 0840 07/16/16 0744 07/17/16 0504 07/17/16 0932 07/17/16 1429  WBC 19.1*  --  15.6* 10.2  --  8.7  HGB 6.8*  --  8.2* 8.2*  --  8.0*  HCT 19.8*  --  24.3* 24.6*  --  24.0*  PLT 38*  --  27* 19*  --  43*  APTT  --   --   --   --  31  --   CREATININE 4.26*  --  2.48* 3.02*  --   --   MG 2.1  --   --   --   --   --   PHOS 4.0  --   --   --   --   --   ALBUMIN 1.7* 1.8* 1.7* 1.7*  --   --   PROT  --  4.7* 4.8* 4.7*  --   --   AST  --  30 38 20  --   --   ALT  --  38 34 25  --   --   ALKPHOS  --  187* 298* 295*  --   --   BILITOT  --  2.9* 3.7* 3.5*  --   --   BILIDIR  --  1.9*  --   --   --   --   IBILI  --  1.0*  --   --   --   --    Estimated Creatinine Clearance: 25.1 mL/min (by  C-G formula based on SCr of 3.02 mg/dL).   Microbiology: Recent Results (from the past 720 hour(s))  Culture, blood (routine x 2)     Status: None (Preliminary result)   Collection Time: 07/13/16  4:20 PM  Result Value Ref Range Status   Specimen Description BLOOD RIGHT WRIST  Final   Special Requests BOTTLES DRAWN AEROBIC AND ANAEROBIC 5CC  Final   Culture NO GROWTH 4 DAYS  Final   Report Status PENDING  Incomplete  Culture, blood (routine x 2)     Status: Abnormal   Collection Time: 07/13/16  5:04 PM  Result Value Ref Range Status   Specimen Description BLOOD RIGHT HAND BAER  Final   Special Requests 3CC  Final   Culture  Setup Time   Final    GRAM NEGATIVE RODS AEROBIC  BOTTLE ONLY CRITICAL RESULT CALLED TO, READ BACK BY AND VERIFIED WITH: M MACCIA,PHARMD AT 1006 07/15/16 BY L BENFIELD    Culture KLEBSIELLA PNEUMONIAE (A)  Final   Report Status 07/17/2016 FINAL  Final   Organism ID, Bacteria KLEBSIELLA PNEUMONIAE  Final      Susceptibility   Klebsiella pneumoniae - MIC*    AMPICILLIN >=32 RESISTANT Resistant     CEFAZOLIN <=4 SENSITIVE Sensitive     CEFEPIME <=1 SENSITIVE Sensitive     CEFTAZIDIME <=1 SENSITIVE Sensitive     CEFTRIAXONE <=1 SENSITIVE Sensitive     CIPROFLOXACIN 1 SENSITIVE Sensitive     GENTAMICIN <=1 SENSITIVE Sensitive     IMIPENEM <=0.25 SENSITIVE Sensitive     TRIMETH/SULFA 40 SENSITIVE Sensitive     AMPICILLIN/SULBACTAM >=32 RESISTANT Resistant     PIP/TAZO 16 SENSITIVE Sensitive     Extended ESBL NEGATIVE Sensitive     * KLEBSIELLA PNEUMONIAE  Blood Culture ID Panel (Reflexed)     Status: Abnormal   Collection Time: 07/13/16  5:04 PM  Result Value Ref Range Status   Enterococcus species NOT DETECTED NOT DETECTED Final   Vancomycin resistance NOT DETECTED NOT DETECTED Final   Listeria monocytogenes NOT DETECTED NOT DETECTED Final   Staphylococcus species NOT DETECTED NOT DETECTED Final   Staphylococcus aureus NOT DETECTED NOT DETECTED Final    Methicillin resistance NOT DETECTED NOT DETECTED Final   Streptococcus species NOT DETECTED NOT DETECTED Final   Streptococcus agalactiae NOT DETECTED NOT DETECTED Final   Streptococcus pneumoniae NOT DETECTED NOT DETECTED Final   Streptococcus pyogenes NOT DETECTED NOT DETECTED Final   Acinetobacter baumannii NOT DETECTED NOT DETECTED Final   Enterobacteriaceae species DETECTED (A) NOT DETECTED Final    Comment: CRITICAL RESULT CALLED TO, READ BACK BY AND VERIFIED WITH: M MACCIA,PHARMD AT 1006 07/15/16 BY L BENFIELD    Enterobacter cloacae complex NOT DETECTED NOT DETECTED Final   Escherichia coli NOT DETECTED NOT DETECTED Final   Klebsiella oxytoca NOT DETECTED NOT DETECTED Final   Klebsiella pneumoniae DETECTED (A) NOT DETECTED Final    Comment: CRITICAL RESULT CALLED TO, READ BACK BY AND VERIFIED WITH: M MACCIA,PHARMD AT 1006 07/15/16 BY L BENFIELD    Proteus species NOT DETECTED NOT DETECTED Final   Serratia marcescens NOT DETECTED NOT DETECTED Final   Carbapenem resistance NOT DETECTED NOT DETECTED Final   Haemophilus influenzae NOT DETECTED NOT DETECTED Final   Neisseria meningitidis NOT DETECTED NOT DETECTED Final   Pseudomonas aeruginosa NOT DETECTED NOT DETECTED Final   Candida albicans NOT DETECTED NOT DETECTED Final   Candida glabrata NOT DETECTED NOT DETECTED Final   Candida krusei NOT DETECTED NOT DETECTED Final   Candida parapsilosis NOT DETECTED NOT DETECTED Final   Candida tropicalis NOT DETECTED NOT DETECTED Final  MRSA PCR Screening     Status: None   Collection Time: 07/13/16  6:31 PM  Result Value Ref Range Status   MRSA by PCR NEGATIVE NEGATIVE Final    Comment:        The GeneXpert MRSA Assay (FDA approved for NASAL specimens only), is one component of a comprehensive MRSA colonization surveillance program. It is not intended to diagnose MRSA infection nor to guide or monitor treatment for MRSA infections.     Medical History: Past Medical History:   Diagnosis Date  . Cancer ([place])   . Chronic kidney disease   . Compression fracture     Medications:  Prescriptions Prior to Admission  Medication Sig Dispense Refill  Last Dose  . acetaminophen (TYLENOL) 325 MG tablet Take 2 tablets (650 mg total) by mouth every 6 (six) hours as needed for mild pain (or Fever >/= 101). 15 tablet 0 Past Week at Unknown time  . acyclovir (ZOVIRAX) 200 MG capsule Take 1 capsule (200 mg total) by mouth 2 (two) times daily. 60 capsule 9 07/13/2016 at Unknown time  . ALPRAZolam (XANAX) 0.25 MG tablet Take 1 tablet (0.25 mg total) by mouth 2 (two) times daily as needed for anxiety. 30 tablet 0 Past Week at Unknown time  . calcium acetate (PHOSLO) 667 MG capsule Take 1 capsule (667 mg total) by mouth 2 (two) times daily with a meal. 60 capsule 1 07/13/2016 at Unknown time  . cyanocobalamin 1000 MCG tablet Take 1 tablet (1,000 mcg total) by mouth daily. 30 tablet 0 07/13/2016 at Unknown time  . dexamethasone (DECADRON) 4 MG tablet Take 5 tablets (20 mg total) by mouth once a week. 60 tablet 1 07/13/2016 at Unknown time  . diphenoxylate-atropine (LOMOTIL) 2.5-0.025 MG tablet Take 1 tablet by mouth 4 (four) times daily as needed for diarrhea or loose stools. 90 tablet 0 Past Month at Unknown time  . lenalidomide (REVLIMID) 2.5 MG capsule Take 1 capsule daily for 14 days, then off 7 days 14 capsule 11 Past Week at Unknown time  . loperamide (IMODIUM A-D) 2 MG tablet Take 2 mg by mouth 4 (four) times daily as needed for diarrhea or loose stools.   Past Week at Unknown time  . loratadine (CLARITIN) 10 MG tablet Take 10 mg by mouth daily as needed for allergies.    Past Month at Unknown time  . multivitamin (RENA-VIT) TABS tablet Take 1 tablet by mouth at bedtime. 30 tablet 0 07/12/2016 at Unknown time  . ondansetron (ZOFRAN) 8 MG tablet Take 1 tablet (8 mg total) by mouth every 8 (eight) hours as needed for nausea. 30 tablet 3 07/12/2016 at Unknown time  . pantoprazole (PROTONIX) 40 MG  tablet Take 1 tablet (40 mg total) by mouth daily. 30 tablet 1 07/13/2016 at Unknown time  . tobramycin (TOBREX) 0.3 % ophthalmic solution Place 1 drop into both eyes every 4 (four) hours. 5 mL 0 Past Month at Unknown time  . triamcinolone (NASACORT AQ) 55 MCG/ACT AERO nasal inhaler Place 2 sprays into the nose daily. 1 Inhaler 1 07/13/2016 at Unknown time  . Vitamin D, Ergocalciferol, (DRISDOL) 50000 units CAPS capsule Take 1 capsule (50,000 Units total) by mouth every 7 (seven) days. 60 capsule 0 07/13/2016 at Unknown time   Scheduled:  . sodium chloride   Intravenous Once  . sodium chloride   Intravenous Once  . aztreonam  1 g Intravenous Q24H  . darbepoetin (ARANESP) injection - DIALYSIS  200 mcg Intravenous Q Sat-HD  . famotidine (PEPCID) IV  20 mg Intravenous Q24H  . fentaNYL      . lidocaine      . midazolam      . midodrine  5 mg Oral TID WC    Assessment: 69 y.o male with thrombocytopenia in the setting of h/o multiple myeloma s/p chemo/radiation admitted on 8/3 with septic shock after days of diarrhea.  Pharmacy consulted to review medications that can induce thrombocytopenia.   PLTC 50 k on admit date 8/4 continues to  decline >38>19k today.  Previous hospitalization:  pltc = 77 on 6/1> 59 on 6/4> 111>107 on 06/06/16.   Review of medications completed:  Possible medication culprits include aztreonam and acyclovir.  Patient on acyclovir PTA, last taken on 07/13/16.  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  On acyclovir since at least as far back as 05/17/16 per EPIC review.     Nicole Cella, RPh Clinical Pharmacist Pager: [phone] 07/17/2016,4:28 PM

## 2016-07-17 NOTE — Progress Notes (Signed)
Subjective: voided 400 cc then another 300 cc overnight acc to pt and his wife.  Doesn't want to have foley or I/O cath, "I can pee, just have to be patient".  Creat up from 2.48 to 3.02 today.  No n/v/SOB.    Objective Vital signs in last 24 hours: Vitals:   07/17/16 0555 07/17/16 0900 07/17/16 1052 07/17/16 1124  BP: 119/62 132/71 129/67 118/62  Pulse: 94 95 90 90  Resp: _0 Temp: 99.6 F (37.6 C) 98.5 F (36.9 C) 98.7 F (37.1 C) 98.6 F (37 C)  TempSrc: Oral Oral Oral Oral  SpO2: 92% 96% 97% 95%  Weight:       Weight change: 0.151 kg (5.3 oz)  Intake/Output Summary (Last 24 hours) at 07/17/16 1207 Last data filed at 07/17/16 0830  Gross per 24 hour  Intake             1000 ml  Output              751 ml  Net              249 ml     Dialysis:  TTS SW  4h  73kg   3K/ 2.25 bath  Hep 7300   R IJ cath/ maturing L BCF Aranesp 40 g weekly   Assessment: 1. Abdominal pain/nausea/vomiting: Possible acalculous cholecystitis- HIDA unrevealing. On broad spectrum abx, IR seeing for possible perc chole drainage today 2. End-stage renal disease: recent diagnosis, renal bx showed myeloma cast neph w diffuse ATN and diffuse mild interstitial scarring.  Making more urine lately and it is possible he could be recovering some function. Will hold off on HD for today and another 1-2 days perhaps, watch creat.   3. Anemia: Secondary to ESRD/multiple myeloma-max ESA. Transfusion 8/5 4. 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 Appears that he is not on VDRA as OP. 6. HTN/vol- pitting edema and hyponatremia- Uf with HD last night  Plan - no HD tomorrow, watch creat 1-2 days see if plateaus  [name] MD [place] pager [phone]    cell [phone] 07/17/2016, 12:14 PM         Labs: Basic Metabolic Panel:  Recent Labs Lab 07/14/16 0300 07/15/16 0222 07/16/16 0744 07/17/16 0504  NA 132* 128* 132* 133*  K 3.8 3.6 3.6 3.6  CL 99* 98* 97* 99*  CO2 21* 20* 26 26  GLUCOSE 198* 153* 129* 142*  BUN 35* 51* 24* 35*  CREATININE 4.08* 4.26* 2.48* 3.02*  CALCIUM 7.2* 7.3* 7.4* 7.6*  PHOS 3.8 4.0  --   --    Liver Function Tests:  Recent Labs Lab 07/15/16 0840 07/16/16 0744 07/17/16 0504  AST 30 38 20  ALT 38 34 25  ALKPHOS 187* 298* 295*  BILITOT 2.9* 3.7* 3.5*  PROT 4.7* 4.8* 4.7*  ALBUMIN 1.8* 1.7* 1.7*    Recent Labs Lab 07/13/16 0932  LIPASE 15   No results for input(s): AMMONIA in the last 168 hours. CBC:  Recent Labs Lab 07/13/16 0810  07/14/16 0300 07/15/16 0222 07/16/16 0744 07/17/16 0504  WBC 13.6*  < > 48.7* 19.1* 15.6* 10.2  NEUTROABS 12.6*  --   --   --  14.8*  --   HGB 8.6*  < >  7.5* 6.8* 8.2* 8.2*  HCT 26.6*  < > 22.1* 19.8* 24.3* 24.6*  MCV 92.1  --  89.8 88.8 88.0 88.8  PLT 86*  < > 50* 38* 27* 19*  < > = values in this interval not displayed. Cardiac Enzymes: No results for input(s): CKTOTAL, CKMB, CKMBINDEX, TROPONINI in the last 168 hours. CBG: No results for input(s): GLUCAP in the last 168 hours.  Iron Studies: No results for input(s): IRON, TIBC, TRANSFERRIN, FERRITIN in the last 72 hours. Studies/Results: Mr Abdomen Mrcp Wo Cm  Result Date: 07/17/2016 CLINICAL DATA:  Right upper quadrant pain. Elevated liver function tests. History of multiple myeloma. EXAM: MRI ABDOMEN WITHOUT CONTRAST  (INCLUDING MRCP) TECHNIQUE: Multiplanar multisequence MR imaging of the abdomen was performed. Heavily T2-weighted images of the biliary and pancreatic ducts were obtained, and three-dimensional MRCP images were rendered by post processing. COMPARISON:  Ultrasound or nuclear medicine hepatobiliary study of 07/13/2016. FINDINGS: Mild to  moderate motion degradation throughout. Lower chest: Left larger than right pleural effusions with bibasilar airspace disease. Normal heart size without pericardial or pleural effusion. Hepatobiliary: No focal liver lesion. No intrahepatic biliary duct dilatation. Gallbladder sludge with mild gallbladder distension. Gallbladder wall thickening including at 10 mm on image 27/series 4. Mild pericholecystic fluid, including on image 25/series 5. No evidence of common duct stone. Pancreas: Mild pancreatic atrophy, without duct dilatation or mass. Limited evaluation for acute pancreatitis, secondary to relatively diffuse abdominal edema. Spleen: Mild splenomegaly, 14.0 cm craniocaudal. Adrenals/Urinary Tract: Normal adrenal glands. Bilateral renal lesions which are likely cysts. No hydronephrosis. Stomach/Bowel: Normal stomach and abdominal small bowel loops. There is wall thickening of and edema within both the ascending and descending colon, including on images 21/series 3 and 44/series 5. Vascular/Lymphatic: Normal aortic caliber. No retroperitoneal or retrocrural adenopathy. Other: Small volume ascites.  Diffuse anasarca. Musculoskeletal: Heterogeneous marrow signal with is foci of T2 hyperintensity within the lower lumbar and lower thoracic spine. IMPRESSION: 1. Motion degraded exam. 2. No biliary duct dilatation or choledocholithiasis. 3. Gallbladder distension with wall thickening and pericholecystic fluid. Concurrent gallbladder sludge. Findings are suspicious for acute cholecystitis. 4. Bilateral pleural effusions and ascites, suggesting fluid overload. 5. Heterogeneous marrow signal with foci of T2 hyperintensity within. Likely related to the clinical history of multiple myeloma. 6. Multi focal colitis.  Distribution favors infection. Electronically Signed   By: Abigail Miyamoto M.D.   On: 07/17/2016 07:55   Mr 3d Recon At Scanner  Result Date: 07/17/2016 CLINICAL DATA:  Right upper quadrant pain. Elevated liver  function tests. History of multiple myeloma. EXAM: MRI ABDOMEN WITHOUT CONTRAST  (INCLUDING MRCP) TECHNIQUE: Multiplanar multisequence MR imaging of the abdomen was performed. Heavily T2-weighted images of the biliary and pancreatic ducts were obtained, and three-dimensional MRCP images were rendered by post processing. COMPARISON:  Ultrasound or nuclear medicine hepatobiliary study of 07/13/2016. FINDINGS: Mild to moderate motion degradation throughout. Lower chest: Left larger than right pleural effusions with bibasilar airspace disease. Normal heart size without pericardial or pleural effusion. Hepatobiliary: No focal liver lesion. No intrahepatic biliary duct dilatation. Gallbladder sludge with mild gallbladder distension. Gallbladder wall thickening including at 10 mm on image 27/series 4. Mild pericholecystic fluid, including on image 25/series 5. No evidence of common duct stone. Pancreas: Mild pancreatic atrophy, without duct dilatation or mass. Limited evaluation for acute pancreatitis, secondary to relatively diffuse abdominal edema. Spleen: Mild splenomegaly, 14.0 cm craniocaudal. Adrenals/Urinary Tract: Normal adrenal glands. Bilateral renal lesions which are likely cysts. No hydronephrosis. Stomach/Bowel: Normal stomach and abdominal  small bowel loops. There is wall thickening of and edema within both the ascending and descending colon, including on images 21/series 3 and 44/series 5. Vascular/Lymphatic: Normal aortic caliber. No retroperitoneal or retrocrural adenopathy. Other: Small volume ascites.  Diffuse anasarca. Musculoskeletal: Heterogeneous marrow signal with is foci of T2 hyperintensity within the lower lumbar and lower thoracic spine. IMPRESSION: 1. Motion degraded exam. 2. No biliary duct dilatation or choledocholithiasis. 3. Gallbladder distension with wall thickening and pericholecystic fluid. Concurrent gallbladder sludge. Findings are suspicious for acute cholecystitis. 4. Bilateral  pleural effusions and ascites, suggesting fluid overload. 5. Heterogeneous marrow signal with foci of T2 hyperintensity within. Likely related to the clinical history of multiple myeloma. 6. Multi focal colitis.  Distribution favors infection. Electronically Signed   By: Abigail Miyamoto M.D.   On: 07/17/2016 07:55   Medications: Infusions: . sodium chloride Stopped (07/15/16 1348)    Scheduled Medications: . sodium chloride   Intravenous Once  . aztreonam  1 g Intravenous Q24H  . darbepoetin (ARANESP) injection - DIALYSIS  200 mcg Intravenous Q Sat-HD  . famotidine (PEPCID) IV  20 mg Intravenous Q24H  . metronidazole  500 mg Intravenous Q8H  . midodrine  5 mg Oral TID WC    have reviewed scheduled and prn medications.  Physical Exam: General: alert, NAD Heart: RRR Lungs: mostly clear Abdomen: soft, non tender Extremities: pitting dependent edema Dialysis Access: PC and left AVF is patent   - placed 6/14   07/17/2016,12:07 PM  LOS: 4 days

## 2016-07-17 NOTE — Progress Notes (Signed)
Daily Rounding Note  07/17/2016, 12:46 PM  LOS: 4 days   SUBJECTIVE:   Chief complaint: abdominal pain    Pain persists, it is worse when he breathes deep.  Hungry and NPO, wondering when he can eat.    OBJECTIVE:         Vital signs in last 24 hours:    Temp:  [98.5 F (36.9 C)-99.6 F (37.6 C)] 98.6 F (37 C) (08/07 1124) Pulse Rate:  [90-95] 90 (08/07 1124) Resp:  [17-18] 18 (08/07 1124) BP: (113-132)/(61-71) 118/62 (08/07 1124) SpO2:  [92 %-97 %] 95 % (08/07 1124) Weight:  [75.8 kg (167 lb)] 75.8 kg (167 lb) (08/06 2033) Last BM Date: 07/15/16 Filed Weights   07/15/16 1813 07/15/16 2022 07/16/16 2033  Weight: 72.4 kg (159 lb 9.8 oz) 73.5 kg (162 lb) 75.8 kg (167 lb)   General: jaundiced.  Ill looking.     Heart: RRR Chest: clear bil in front.  No dyspnea.  + cough Abdomen: soft, BS present, did not palpate  Extremities: no CCE Neuro/Psych:  Alert, oriented x 3.    Intake/Output from previous day: 08/06 0701 - 08/07 0700 In: 1120 [P.O.:720; IV Piggyback:400] Out: 750 [Urine:750]  Intake/Output this shift: Total I/O In: 0  Out: 1 [Stool:1]  Lab Results:   Recent Labs  07/15/16 0840 07/16/16 0744 07/17/16 0504  PROT 4.7* 4.8* 4.7*  ALBUMIN 1.8* 1.7* 1.7*  AST 30 38 20  ALT 38 34 25  ALKPHOS 187* 298* 295*  BILITOT 2.9* 3.7* 3.5*  BILIDIR 1.9*  --   --   IBILI 1.0*  --   --     Studies/Results:  Mr 3d Recon At Scanner  Result Date: 07/17/2016 CLINICAL DATA:  Right upper quadrant pain. Elevated liver function tests. History of multiple myeloma. EXAM: MRI ABDOMEN WITHOUT CONTRAST  (INCLUDING MRCP) TECHNIQUE: . IMPRESSION: 1. Motion degraded exam. 2. No biliary duct dilatation or choledocholithiasis. 3. Gallbladder distension with wall thickening and pericholecystic fluid. Concurrent gallbladder sludge. Findings are suspicious for acute cholecystitis. 4. Bilateral pleural effusions and ascites,  suggesting fluid overload. 5. Heterogeneous marrow signal with foci of T2 hyperintensity within. Likely related to the clinical history of multiple myeloma. 6. Multi focal colitis.  Distribution favors infection. Electronically Signed   By: Abigail Miyamoto M.D.   On: 07/17/2016 07:55    ASSESMENT:   *  GNR sepsis, RUQ pain and acutecholecystitis.  As no biliary duct obstruction or CBD stones on MRCP, defer mgt to surgeon.  Perc cholecystostomy tube per IR once he gets platelets.  On broad spectrum Abx.   *  Thrombocytopenia.   Platelets infusing now before chole drain attempted.  Note irregular marrow appearance on the MRI.  Dr Alvy Bimler feels low platelets are likely due to antibiotic therapy: azactam      PLAN   *  As no need for ERCP, signing off.  GI available if needed.  Tried to answer families many questions but most of these need to be addressed by IR and gen surgery.    Azucena Freed  07/17/2016, 12:46 PM Pager: [phone]     GI Attending   I have taken an interval history, reviewed the chart and examined the patient. I agree with the Advanced Practitioner's note, impression and recommendations.     Doing well after cholecystostomy I ordered diet We will see if needed - please call if so. Gatha Mayer, MD, [facility]  [phone] (pager) [phone] after 5 PM, weekends and holidays  07/17/2016 4:58 PM

## 2016-07-17 NOTE — Procedures (Signed)
43f cholecystostomy drain placed under [place] and fluoro No complication No blood loss. See complete dictation in [facility].

## 2016-07-17 NOTE — Progress Notes (Signed)
Pt bladder scan shows 371cc. Pt refusing to attempt urination at this time and refusing I&O cath. States, "I don't have to go right now. I already went twice last night."

## 2016-07-17 NOTE — Progress Notes (Signed)
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 He presented on  8/3 with one-day history of nausea, vomiting and abdominal pain and some preceding diarrhea prior to that. Developed septic shock after days of diarrhea Reports decreased ability to tolerate oral intake. In the emergency room he was noted to be hypotensive and ultrasound suggestive of acalculous cholecystitis-seen by surgery and awaiting HIDA scan. He remained hypotensive upon transfer to the ICU and was started on pressors.   Assessment and plan Hypotension > resolved, off all vasopressors  Sepsis - initial suspicion for gallbladder as source. Of note, pt is immunocompromised. -Patient evaluated by general surgery, liver function is improving, patient may have possible CBD stone. Presentation concerning for acalculous cholecystitis  , surgery recommends percutaneous drain and not a cholecystectomy at this time  Continue Midodrine 5 mg TID Patient currently on aztreonam and Flagyl, vancomycin discontinued 8/5   MRI/MRCP showed findings c/w cholecystitis, no stones, no CBD stones along with asc and des colitis Patient would need platelet transfusion prior to procedure, ordered 2 units of platelets, IR consult placed  Thrombocytopenia-in the  setting of multiple myeloma, Serum light chain has dropped significantly to near normal ratio Due to recent pancytopenia, treatment is placed on hold Pancytopenia has improved since discontinuation of Revlimid.   anemia is related to chronic renal failure. He is getting ESA through dialysis center platelet count 19,Ni Alvy Bimler, MD notified, White blood cell count has trended down from 48.7> 10.2 Will transfuse 2 units of platelets prior to IR drain placement  Transaminitis - improving, passed biliary stone? Appreciate surgery Appreciate GI consult Blood cultures are positive for  Klebsiella pneumonia ,sepsis is resolving.    End-stage renal disease: Secondary to multiple myeloma and getting hemodialysis via right IJ tunneled dialysis catheter- last done on Tuesday. .    Continue with bladder scans, in and out cath when needed - refusing in and out caths- "they are not giving me enough time to go " attempted to explain the rationale of not letting liters of urine stay in bladder to no avail.  Labs tomorrow to assess for HD need      Multiple myeloma-On chemotherapy/radiation to left femur-further management per hematology   DVT prophylaxsis SCDs  Code Status:  Full code    Family Communication: Discussed in detail with the patient, all imaging results, lab results explained to the patient   Disposition Plan:   Anticipate discharge in 3-4 days     Consultants:  Critical care  General surgery   STUDIES:  abd u/s 8/3>>> Distended gallbladder with mild gallbladder wall thickening and gallbladder sludge. Could not exclude acalculous cholecystitis.  Normal caliber common bile duct.  Small amount of right upper quadrant ascites and right pleural effusion.  CULTURES: BC x 2  8/3>>>   ANTIBIOTICS: Flagyl 8/3>>> vanc 8/3>>> 8/5 Diflucan 8/3>>>8/4 Levaquin 8/3>>>8/4 Aztreonam 8/3>>>     LINES/TUBES: R IJ Perm cath 6/1>>>       HPI/Subjective: Still complaining  of some right right upper quadrant pain, however not nauseous,  Objective: Vitals:   07/16/16 0935 07/16/16 1649 07/16/16 2033 07/17/16 0555  BP: (!) 102/53 125/61 113/68 119/62  Pulse: 85 90 92 94  Resp: 18 18 18 17   Temp: 98.3 F (36.8 C) 98.8 F (37.1 C) 99.5 F (37.5 C) 99.6 F (37.6 C)  TempSrc: Oral Oral Oral Oral  SpO2: 97% 96% 94% 92%  Weight:   75.8 kg (167 lb)     Intake/Output Summary (Last 24 hours) at 07/17/16 0846 Last data filed at 07/17/16 0615  Gross per 24 hour  Intake             1120 ml  Output              750 ml  Net              370 ml    Exam:  Examination:  General exam: Appears calm and comfortable  Respiratory system: Clear to auscultation. Respiratory effort normal. Cardiovascular system: S1 & S2 heard, RRR. No JVD, murmurs, rubs, gallops or clicks. No pedal edema. Gastrointestinal system: Abdomen is nondistended, soft and nontender. No organomegaly or masses felt. Normal bowel sounds heard. Central nervous system: Alert and oriented. No focal neurological deficits. Extremities: Symmetric 5 x 5 power. Skin: No rashes, lesions or ulcers Psychiatry: Judgement and insight appear normal. Mood & affect appropriate.     Data Reviewed: I have personally reviewed following labs and imaging studies  Micro Results Recent Results (from the past 240 hour(s))  Culture, blood (routine x 2)     Status: None (Preliminary result)   Collection Time: 07/13/16  4:20 PM  Result Value Ref Range Status   Specimen Description BLOOD RIGHT WRIST  Final   Special Requests BOTTLES DRAWN AEROBIC AND ANAEROBIC 5CC  Final   Culture NO GROWTH 3 DAYS  Final   Report Status PENDING  Incomplete  Culture, blood (routine x 2)     Status: None (Preliminary result)   Collection Time: 07/13/16  5:04 PM  Result Value Ref Range Status   Specimen Description BLOOD RIGHT HAND BAER  Final   Special Requests 3CC  Final   Culture  Setup Time   Final    GRAM NEGATIVE  RODS AEROBIC BOTTLE ONLY CRITICAL RESULT CALLED TO, READ BACK BY AND VERIFIED WITH: M MACCIA,PHARMD AT 1006 07/15/16 BY L BENFIELD    Culture GRAM NEGATIVE RODS  Final   Report Status PENDING  Incomplete  Blood Culture ID Panel (Reflexed)     Status: Abnormal   Collection Time: 07/13/16  5:04 PM  Result Value Ref Range Status   Enterococcus species NOT DETECTED NOT DETECTED Final   Vancomycin resistance NOT DETECTED NOT DETECTED Final   Listeria monocytogenes NOT DETECTED NOT DETECTED Final   Staphylococcus species NOT DETECTED NOT DETECTED Final   Staphylococcus aureus NOT DETECTED NOT DETECTED Final   Methicillin resistance NOT DETECTED NOT DETECTED Final   Streptococcus species NOT DETECTED NOT DETECTED Final   Streptococcus agalactiae NOT DETECTED NOT DETECTED Final   Streptococcus pneumoniae NOT DETECTED NOT DETECTED Final   Streptococcus pyogenes NOT DETECTED NOT DETECTED Final   Acinetobacter baumannii NOT DETECTED NOT DETECTED Final   Enterobacteriaceae species DETECTED (A) NOT DETECTED Final  Comment: CRITICAL RESULT CALLED TO, READ BACK BY AND VERIFIED WITH: M MACCIA,PHARMD AT 1006 07/15/16 BY L BENFIELD    Enterobacter cloacae complex NOT DETECTED NOT DETECTED Final   Escherichia coli NOT DETECTED NOT DETECTED Final   Klebsiella oxytoca NOT DETECTED NOT DETECTED Final   Klebsiella pneumoniae DETECTED (A) NOT DETECTED Final    Comment: CRITICAL RESULT CALLED TO, READ BACK BY AND VERIFIED WITH: M MACCIA,PHARMD AT 1006 07/15/16 BY L BENFIELD    Proteus species NOT DETECTED NOT DETECTED Final   Serratia marcescens NOT DETECTED NOT DETECTED Final   Carbapenem resistance NOT DETECTED NOT DETECTED Final   Haemophilus influenzae NOT DETECTED NOT DETECTED Final   Neisseria meningitidis NOT DETECTED NOT DETECTED Final   Pseudomonas aeruginosa NOT DETECTED NOT DETECTED Final   Candida albicans NOT DETECTED NOT DETECTED Final   Candida glabrata NOT DETECTED NOT DETECTED Final    Candida krusei NOT DETECTED NOT DETECTED Final   Candida parapsilosis NOT DETECTED NOT DETECTED Final   Candida tropicalis NOT DETECTED NOT DETECTED Final  MRSA PCR Screening     Status: None   Collection Time: 07/13/16  6:31 PM  Result Value Ref Range Status   MRSA by PCR NEGATIVE NEGATIVE Final    Comment:        The GeneXpert MRSA Assay (FDA approved for NASAL specimens only), is one component of a comprehensive MRSA colonization surveillance program. It is not intended to diagnose MRSA infection nor to guide or monitor treatment for MRSA infections.     Radiology Reports Nm Hepatobiliary Liver Func  Result Date: 07/14/2016 CLINICAL DATA:  Right upper quadrant pain. Abnormal sonographic appearance of the gallbladder. EXAM: NUCLEAR MEDICINE HEPATOBILIARY IMAGING TECHNIQUE: Sequential images of the abdomen were obtained out to 120 minutes following intravenous administration of radiopharmaceutical. After IV administered of 3 mg morphine, imaging obtained for an additional 30 minutes. RADIOPHARMACEUTICALS:  5.2  mCi Tc-41m Choletec IV COMPARISON:  Right upper quadrant ultrasound yesterday. FINDINGS: Imaging obtained for 2 hours and 30 minutes. There is mildly delayed hepatic uptake. There is no visualized tracer accumulation within the intrahepatic ducts. At 35 minutes tracer is visualized in the midline, which may be duodenum or common bile duct. There is midline tracer increases in intensity with to and fro trace accumulation proximal and distally likely refluxed into the stomach and passing distally proximal small bowel. After 120 minutes, no gallbladder activity. As small bowel activity was visualized, 3 mg of morphine was administered. Increased GI tract activity, no gallbladder visualization after morphine. IMPRESSION: Gallbladder not visualized after 2.5 hours as well as the administration of morphine. In addition however the intra and extrahepatic bile ducts are not well seen.  Nonvisualization of the gallbladder may be secondary to cholecystitis versus underlying hepatocellular dysfunction (patient bilirubin greater than 3). Limited visualization of the biliary tree activity is likely secondary to hepatic cellular dysfunction. Biliary obstruction is felt much less likely as GI tract activity is observed. Electronically Signed   By: MJeb LeveringM.D.   On: 07/14/2016 02:59   Mr Abdomen Mrcp Wo Cm  Result Date: 07/17/2016 CLINICAL DATA:  Right upper quadrant pain. Elevated liver function tests. History of multiple myeloma. EXAM: MRI ABDOMEN WITHOUT CONTRAST  (INCLUDING MRCP) TECHNIQUE: Multiplanar multisequence MR imaging of the abdomen was performed. Heavily T2-weighted images of the biliary and pancreatic ducts were obtained, and three-dimensional MRCP images were rendered by post processing. COMPARISON:  Ultrasound or nuclear medicine hepatobiliary study of 07/13/2016. FINDINGS: Mild to moderate  motion degradation throughout. Lower chest: Left larger than right pleural effusions with bibasilar airspace disease. Normal heart size without pericardial or pleural effusion. Hepatobiliary: No focal liver lesion. No intrahepatic biliary duct dilatation. Gallbladder sludge with mild gallbladder distension. Gallbladder wall thickening including at 10 mm on image 27/series 4. Mild pericholecystic fluid, including on image 25/series 5. No evidence of common duct stone. Pancreas: Mild pancreatic atrophy, without duct dilatation or mass. Limited evaluation for acute pancreatitis, secondary to relatively diffuse abdominal edema. Spleen: Mild splenomegaly, 14.0 cm craniocaudal. Adrenals/Urinary Tract: Normal adrenal glands. Bilateral renal lesions which are likely cysts. No hydronephrosis. Stomach/Bowel: Normal stomach and abdominal small bowel loops. There is wall thickening of and edema within both the ascending and descending colon, including on images 21/series 3 and 44/series 5.  Vascular/Lymphatic: Normal aortic caliber. No retroperitoneal or retrocrural adenopathy. Other: Small volume ascites.  Diffuse anasarca. Musculoskeletal: Heterogeneous marrow signal with is foci of T2 hyperintensity within the lower lumbar and lower thoracic spine. IMPRESSION: 1. Motion degraded exam. 2. No biliary duct dilatation or choledocholithiasis. 3. Gallbladder distension with wall thickening and pericholecystic fluid. Concurrent gallbladder sludge. Findings are suspicious for acute cholecystitis. 4. Bilateral pleural effusions and ascites, suggesting fluid overload. 5. Heterogeneous marrow signal with foci of T2 hyperintensity within. Likely related to the clinical history of multiple myeloma. 6. Multi focal colitis.  Distribution favors infection. Electronically Signed   By: Abigail Miyamoto M.D.   On: 07/17/2016 07:55   Mr 3d Recon At Scanner  Result Date: 07/17/2016 CLINICAL DATA:  Right upper quadrant pain. Elevated liver function tests. History of multiple myeloma. EXAM: MRI ABDOMEN WITHOUT CONTRAST  (INCLUDING MRCP) TECHNIQUE: Multiplanar multisequence MR imaging of the abdomen was performed. Heavily T2-weighted images of the biliary and pancreatic ducts were obtained, and three-dimensional MRCP images were rendered by post processing. COMPARISON:  Ultrasound or nuclear medicine hepatobiliary study of 07/13/2016. FINDINGS: Mild to moderate motion degradation throughout. Lower chest: Left larger than right pleural effusions with bibasilar airspace disease. Normal heart size without pericardial or pleural effusion. Hepatobiliary: No focal liver lesion. No intrahepatic biliary duct dilatation. Gallbladder sludge with mild gallbladder distension. Gallbladder wall thickening including at 10 mm on image 27/series 4. Mild pericholecystic fluid, including on image 25/series 5. No evidence of common duct stone. Pancreas: Mild pancreatic atrophy, without duct dilatation or mass. Limited evaluation for acute  pancreatitis, secondary to relatively diffuse abdominal edema. Spleen: Mild splenomegaly, 14.0 cm craniocaudal. Adrenals/Urinary Tract: Normal adrenal glands. Bilateral renal lesions which are likely cysts. No hydronephrosis. Stomach/Bowel: Normal stomach and abdominal small bowel loops. There is wall thickening of and edema within both the ascending and descending colon, including on images 21/series 3 and 44/series 5. Vascular/Lymphatic: Normal aortic caliber. No retroperitoneal or retrocrural adenopathy. Other: Small volume ascites.  Diffuse anasarca. Musculoskeletal: Heterogeneous marrow signal with is foci of T2 hyperintensity within the lower lumbar and lower thoracic spine. IMPRESSION: 1. Motion degraded exam. 2. No biliary duct dilatation or choledocholithiasis. 3. Gallbladder distension with wall thickening and pericholecystic fluid. Concurrent gallbladder sludge. Findings are suspicious for acute cholecystitis. 4. Bilateral pleural effusions and ascites, suggesting fluid overload. 5. Heterogeneous marrow signal with foci of T2 hyperintensity within. Likely related to the clinical history of multiple myeloma. 6. Multi focal colitis.  Distribution favors infection. Electronically Signed   By: Abigail Miyamoto M.D.   On: 07/17/2016 07:55   Dg Chest Port 1 View  Result Date: 07/13/2016 CLINICAL DATA:  Effusion. Abnormal labs.Hx of CKD, dialysis patient.Ex-smoker, quit ~30 years  ago. EXAM: PORTABLE CHEST 1 VIEW COMPARISON:  05/24/2016 FINDINGS: Cardiac silhouette is normal in size. No mediastinal or hilar masses or evidence of adenopathy. There is vascular congestion. Left greater than right pleural effusions are noted. There is some hazy opacity in the lung bases, likely due to atelectasis. No pneumothorax. Right internal jugular dual-lumen central venous catheter is stable and well positioned. IMPRESSION: 1. Small, left greater than right, pleural effusions with associated lung base opacity, most likely  atelectasis. 2. Central vascular congestion without overt pulmonary edema. Electronically Signed   By: Lajean Manes M.D.   On: 07/13/2016 17:27   US Abdomen Limited Ruq  Result Date: 07/13/2016 CLINICAL DATA:  Right upper quadrant pain for 2 days. Nausea and vomiting for 1 day. EXAM: US ABDOMEN LIMITED - RIGHT UPPER QUADRANT COMPARISON:  None. FINDINGS: Gallbladder: The gallbladder is mildly distended measuring 5.2 cm in transverse dimension. This also mild gallbladder wall thickening and gallbladder sludge. No definite gallstones or pericholecystic fluid. Negative sonographic Murphy sign. Common bile duct: Diameter: 4.1 mm Liver: Normal echogenicity without focal lesion or biliary dilatation. Small amount of right upper quadrant ascites. Right pleural effusion noted. IMPRESSION: 1. Distended gallbladder with mild gallbladder wall thickening and gallbladder sludge. Could not exclude acalculous cholecystitis. 2. Normal caliber common bile duct. 3. Normal liver. 4. Small amount of right upper quadrant ascites and right pleural effusion. Electronically Signed   By: Marijo Sanes M.D.   On: 07/13/2016 12:05     CBC  Recent Labs Lab 07/10/16 1019 07/13/16 0810 07/14/16 0300 07/15/16 0222 07/16/16 0744 07/17/16 0504  WBC 6.3 13.6* 48.7* 19.1* 15.6* 10.2  HGB 8.9* 8.6* 7.5* 6.8* 8.2* 8.2*  HCT 26.2* 26.6* 22.1* 19.8* 24.3* 24.6*  PLT 38* 86* 50* 38* 27* 19*  MCV 89.7 92.1 89.8 88.8 88.0 88.8  MCH 30.5 29.9 30.5 30.5 29.7 29.6  MCHC 34.0 32.5 33.9 34.3 33.7 33.3  RDW 15.1* 16.6* 16.0* 15.9* 16.2* 16.0*  LYMPHSABS 0.5* 0.2*  --   --  0.2*  --   MONOABS 0.3 0.7  --   --  0.6  --   EOSABS 0.1 0.0  --   --  0.0  --   BASOSABS 0.0 0.0  --   --  0.0  --     Chemistries   Recent Labs Lab 07/13/16 0810 07/14/16 0300 07/15/16 0222 07/15/16 0840 07/16/16 0744 07/17/16 0504  NA 134* 132* 128*  --  132* 133*  K 4.2 3.8 3.6  --  3.6 3.6  CL  --  99* 98*  --  97* 99*  CO2 23 21* 20*  --  26 26   GLUCOSE 152* 198* 153*  --  129* 142*  BUN 28.6* 35* 51*  --  24* 35*  CREATININE 3.3* 4.08* 4.26*  --  2.48* 3.02*  CALCIUM 8.4 7.2* 7.3*  --  7.4* 7.6*  MG  --  1.2* 2.1  --   --   --   AST 67* 44*  --  30 38 20  ALT 74* 51  --  38 34 25  ALKPHOS 251* 226*  --  187* 298* 295*  BILITOT 3.43* 5.0*  --  2.9* 3.7* 3.5*   ------------------------------------------------------------------------------------------------------------------ estimated creatinine clearance is 25.1 mL/min (by C-G formula based on SCr of 3.02 mg/dL). ------------------------------------------------------------------------------------------------------------------ No results for input(s): HGBA1C in the last 72 hours. ------------------------------------------------------------------------------------------------------------------ No results for input(s): CHOL, HDL, LDLCALC, TRIG, CHOLHDL, LDLDIRECT in the last 72 hours. ------------------------------------------------------------------------------------------------------------------ No results  for input(s): TSH, T4TOTAL, T3FREE, THYROIDAB in the last 72 hours.  Invalid input(s): FREET3 ------------------------------------------------------------------------------------------------------------------ No results for input(s): VITAMINB12, FOLATE, FERRITIN, TIBC, IRON, RETICCTPCT in the last 72 hours.  Coagulation profile No results for input(s): INR, PROTIME in the last 168 hours.  No results for input(s): DDIMER in the last 72 hours.  Cardiac Enzymes No results for input(s): CKMB, TROPONINI, MYOGLOBIN in the last 168 hours.  Invalid input(s): CK ------------------------------------------------------------------------------------------------------------------ Invalid input(s): POCBNP   CBG: No results for input(s): GLUCAP in the last 168 hours.     Studies: Mr Abdomen Mrcp Wo Cm  Result Date: 07/17/2016 CLINICAL DATA:  Right upper quadrant pain. Elevated  liver function tests. History of multiple myeloma. EXAM: MRI ABDOMEN WITHOUT CONTRAST  (INCLUDING MRCP) TECHNIQUE: Multiplanar multisequence MR imaging of the abdomen was performed. Heavily T2-weighted images of the biliary and pancreatic ducts were obtained, and three-dimensional MRCP images were rendered by post processing. COMPARISON:  Ultrasound or nuclear medicine hepatobiliary study of 07/13/2016. FINDINGS: Mild to moderate motion degradation throughout. Lower chest: Left larger than right pleural effusions with bibasilar airspace disease. Normal heart size without pericardial or pleural effusion. Hepatobiliary: No focal liver lesion. No intrahepatic biliary duct dilatation. Gallbladder sludge with mild gallbladder distension. Gallbladder wall thickening including at 10 mm on image 27/series 4. Mild pericholecystic fluid, including on image 25/series 5. No evidence of common duct stone. Pancreas: Mild pancreatic atrophy, without duct dilatation or mass. Limited evaluation for acute pancreatitis, secondary to relatively diffuse abdominal edema. Spleen: Mild splenomegaly, 14.0 cm craniocaudal. Adrenals/Urinary Tract: Normal adrenal glands. Bilateral renal lesions which are likely cysts. No hydronephrosis. Stomach/Bowel: Normal stomach and abdominal small bowel loops. There is wall thickening of and edema within both the ascending and descending colon, including on images 21/series 3 and 44/series 5. Vascular/Lymphatic: Normal aortic caliber. No retroperitoneal or retrocrural adenopathy. Other: Small volume ascites.  Diffuse anasarca. Musculoskeletal: Heterogeneous marrow signal with is foci of T2 hyperintensity within the lower lumbar and lower thoracic spine. IMPRESSION: 1. Motion degraded exam. 2. No biliary duct dilatation or choledocholithiasis. 3. Gallbladder distension with wall thickening and pericholecystic fluid. Concurrent gallbladder sludge. Findings are suspicious for acute cholecystitis. 4.  Bilateral pleural effusions and ascites, suggesting fluid overload. 5. Heterogeneous marrow signal with foci of T2 hyperintensity within. Likely related to the clinical history of multiple myeloma. 6. Multi focal colitis.  Distribution favors infection. Electronically Signed   By: Abigail Miyamoto M.D.   On: 07/17/2016 07:55   Mr 3d Recon At Scanner  Result Date: 07/17/2016 CLINICAL DATA:  Right upper quadrant pain. Elevated liver function tests. History of multiple myeloma. EXAM: MRI ABDOMEN WITHOUT CONTRAST  (INCLUDING MRCP) TECHNIQUE: Multiplanar multisequence MR imaging of the abdomen was performed. Heavily T2-weighted images of the biliary and pancreatic ducts were obtained, and three-dimensional MRCP images were rendered by post processing. COMPARISON:  Ultrasound or nuclear medicine hepatobiliary study of 07/13/2016. FINDINGS: Mild to moderate motion degradation throughout. Lower chest: Left larger than right pleural effusions with bibasilar airspace disease. Normal heart size without pericardial or pleural effusion. Hepatobiliary: No focal liver lesion. No intrahepatic biliary duct dilatation. Gallbladder sludge with mild gallbladder distension. Gallbladder wall thickening including at 10 mm on image 27/series 4. Mild pericholecystic fluid, including on image 25/series 5. No evidence of common duct stone. Pancreas: Mild pancreatic atrophy, without duct dilatation or mass. Limited evaluation for acute pancreatitis, secondary to relatively diffuse abdominal edema. Spleen: Mild splenomegaly, 14.0 cm craniocaudal. Adrenals/Urinary Tract: Normal adrenal glands. Bilateral renal lesions which  are likely cysts. No hydronephrosis. Stomach/Bowel: Normal stomach and abdominal small bowel loops. There is wall thickening of and edema within both the ascending and descending colon, including on images 21/series 3 and 44/series 5. Vascular/Lymphatic: Normal aortic caliber. No retroperitoneal or retrocrural adenopathy.  Other: Small volume ascites.  Diffuse anasarca. Musculoskeletal: Heterogeneous marrow signal with is foci of T2 hyperintensity within the lower lumbar and lower thoracic spine. IMPRESSION: 1. Motion degraded exam. 2. No biliary duct dilatation or choledocholithiasis. 3. Gallbladder distension with wall thickening and pericholecystic fluid. Concurrent gallbladder sludge. Findings are suspicious for acute cholecystitis. 4. Bilateral pleural effusions and ascites, suggesting fluid overload. 5. Heterogeneous marrow signal with foci of T2 hyperintensity within. Likely related to the clinical history of multiple myeloma. 6. Multi focal colitis.  Distribution favors infection. Electronically Signed   By: Abigail Miyamoto M.D.   On: 07/17/2016 07:55      No results found for: HGBA1C Lab Results  Component Value Date   CREATININE 3.02 (H) 07/17/2016       Scheduled Meds: . sodium chloride   Intravenous Once  . aztreonam  1 g Intravenous Q24H  . darbepoetin (ARANESP) injection - DIALYSIS  200 mcg Intravenous Q Sat-HD  . famotidine (PEPCID) IV  20 mg Intravenous Q24H  . metronidazole  500 mg Intravenous Q8H  . midodrine  5 mg Oral TID WC   Continuous Infusions: . sodium chloride Stopped (07/15/16 1348)     LOS: 4 days    Time spent: >30 MINS    [facility]  [facility] Pager [phone]. If 7PM-7AM, please contact night-coverage at [email or web address], password [facility] 07/17/2016, 8:46 AM  LOS: 4 days

## 2016-07-17 NOTE — Progress Notes (Signed)
Second unit of plasma started and finished in radiology.

## 2016-07-17 NOTE — Progress Notes (Signed)
Lab reports a critical PLT 19. K. Schorr NP notified.

## 2016-07-17 NOTE — Progress Notes (Addendum)
Pharmacy Antibiotic Note Thomas [name] is a 70 y.o. male with multiple myeloma admitted on 07/13/2016 with sepsis w/ gallbladder as suspected source. . Pharmacy consulted for  Aztreonam and metronidazole dosing. 8/3 Blood Cx:1/2 klebsiella pneumoniae, sensitive to Imipenem (pan sensitive except resistant to ampicillin and unasyn, ESBL negative). Findings are suspicious for acute cholecystitis.  Plan for percutaneous cholecystostomy drain per Dr Redmond Pulling.  Continues on aztreonam and metronidazole. Afebrile, WBC downtrending.  Patient has ESRD on HD Tues, Thurs, Sat.-  Last HD done Sat 8/5   Plan: 1. Continue Aztreonam 1g IV q24h at 1800. Dose should follow HD on HD days.  2. Continue Metronidazole 500 mg IV every 8 hours.  3. Monitor clinical progress, cultures.   Weight: 167 lb (75.8 kg)  Temp (24hrs), Avg:99.1 F (37.3 C), Min:98.5 F (36.9 C), Max:99.6 F (37.6 C)   Recent Labs Lab 07/13/16 0810 07/13/16 0810 07/13/16 0946 07/13/16 1500 07/13/16 1850 07/13/16 2240 07/14/16 0300 07/15/16 0222 07/16/16 0744 07/17/16 0504  WBC 13.6*  --   --   --   --   --  48.7* 19.1* 15.6* 10.2  CREATININE  --  3.3*  --   --   --   --  4.08* 4.26* 2.48* 3.02*  LATICACIDVEN  --   --  4.74* 2.75* 3.4* 3.2*  --   --   --   --     Estimated Creatinine Clearance: 25.1 mL/min (by C-G formula based on SCr of 3.02 mg/dL).    Allergies  Allergen Reactions  . Penicillins Rash and Other (See Comments)    Has patient had a PCN reaction causing immediate rash, facial/tongue/throat swelling, SOB or lightheadedness with hypotension: YES + Reaction causing SEVERE RASH involving MUCUS MEMBRANES or SKIN NECROSIS >> YES Reaction that required hospitalization: NO Reaction occurring within the last 10 years: NO If all of the above answers are "NO", then may proceed with Cephalosporin use.   . Dilaudid [Hydromorphone Hcl] Other (See Comments)    hallucinations  . Heparin     HIT panel/SRA pending  . Codeine  Other (See Comments)    "MAKES ME JUMPY"    Antimicrobials this admission: 8/3 Azactam >> 8/3 Flagyl >> 8/3 Vancomycin >>8/5 8/3 Fluconazole >> 8/3 8/4 Levaquin >> 8/3 8/3 Cipro x 1>>8/3  Dose adjustments this admission: Aztreonam doses reduced to reflect ESRD, HD schedule (to give dose after HD on HD days).   Microbiology results: 8/3 BCx:1/2 kleb pna:  PAN sensitive except Resistant to Amp, Unasyn;  ESBL negative 8/3 MRSA PCR: neg  Thank you for allowing pharmacy to be a part of this patient's care.  Nicole Cella, RPh Clinical Pharmacist Pager: [phone] 07/17/2016 10:26 AM

## 2016-07-17 NOTE — Progress Notes (Signed)
Subjective: Still with some right sided pain. No n/v. Hasn't walked. Hurts in abd when coughs.   Objective: Vital signs in last 24 hours: Temp:  [98.3 F (36.8 C)-99.6 F (37.6 C)] 99.6 F (37.6 C) (08/07 0555) Pulse Rate:  [85-94] 94 (08/07 0555) Resp:  [17-18] 17 (08/07 0555) BP: (102-125)/(53-68) 119/62 (08/07 0555) SpO2:  [92 %-97 %] 92 % (08/07 0555) Weight:  [75.8 kg (167 lb)] 75.8 kg (167 lb) (08/06 2033) Last BM Date: 07/15/16  Intake/Output from previous day: 08/06 0701 - 08/07 0700 In: 1120 [P.O.:720; IV Piggyback:400] Out: 750 [Urine:750] Intake/Output this shift: No intake/output data recorded.  Alert, nontoxic, deconditioned Soft, nd, +RUQ TTP  Lab Results:   Recent Labs  07/16/16 0744 07/17/16 0504  WBC 15.6* 10.2  HGB 8.2* 8.2*  HCT 24.3* 24.6*  PLT 27* 19*   BMET  Recent Labs  07/16/16 0744 07/17/16 0504  NA 132* 133*  K 3.6 3.6  CL 97* 99*  CO2 26 26  GLUCOSE 129* 142*  BUN 24* 35*  CREATININE 2.48* 3.02*  CALCIUM 7.4* 7.6*   PT/INR No results for input(s): LABPROT, INR in the last 72 hours. ABG No results for input(s): PHART, HCO3 in the last 72 hours.  Invalid input(s): PCO2, PO2  Studies/Results: Mr Abdomen Mrcp Wo Cm  Result Date: 07/17/2016 CLINICAL DATA:  Right upper quadrant pain. Elevated liver function tests. History of multiple myeloma. EXAM: MRI ABDOMEN WITHOUT CONTRAST  (INCLUDING MRCP) TECHNIQUE: Multiplanar multisequence MR imaging of the abdomen was performed. Heavily T2-weighted images of the biliary and pancreatic ducts were obtained, and three-dimensional MRCP images were rendered by post processing. COMPARISON:  Ultrasound or nuclear medicine hepatobiliary study of 07/13/2016. FINDINGS: Mild to moderate motion degradation throughout. Lower chest: Left larger than right pleural effusions with bibasilar airspace disease. Normal heart size without pericardial or pleural effusion. Hepatobiliary: No focal liver lesion. No  intrahepatic biliary duct dilatation. Gallbladder sludge with mild gallbladder distension. Gallbladder wall thickening including at 10 mm on image 27/series 4. Mild pericholecystic fluid, including on image 25/series 5. No evidence of common duct stone. Pancreas: Mild pancreatic atrophy, without duct dilatation or mass. Limited evaluation for acute pancreatitis, secondary to relatively diffuse abdominal edema. Spleen: Mild splenomegaly, 14.0 cm craniocaudal. Adrenals/Urinary Tract: Normal adrenal glands. Bilateral renal lesions which are likely cysts. No hydronephrosis. Stomach/Bowel: Normal stomach and abdominal small bowel loops. There is wall thickening of and edema within both the ascending and descending colon, including on images 21/series 3 and 44/series 5. Vascular/Lymphatic: Normal aortic caliber. No retroperitoneal or retrocrural adenopathy. Other: Small volume ascites.  Diffuse anasarca. Musculoskeletal: Heterogeneous marrow signal with is foci of T2 hyperintensity within the lower lumbar and lower thoracic spine. IMPRESSION: 1. Motion degraded exam. 2. No biliary duct dilatation or choledocholithiasis. 3. Gallbladder distension with wall thickening and pericholecystic fluid. Concurrent gallbladder sludge. Findings are suspicious for acute cholecystitis. 4. Bilateral pleural effusions and ascites, suggesting fluid overload. 5. Heterogeneous marrow signal with foci of T2 hyperintensity within. Likely related to the clinical history of multiple myeloma. 6. Multi focal colitis.  Distribution favors infection. Electronically Signed   By: Abigail Miyamoto M.D.   On: 07/17/2016 07:55   Mr 3d Recon At Scanner  Result Date: 07/17/2016 CLINICAL DATA:  Right upper quadrant pain. Elevated liver function tests. History of multiple myeloma. EXAM: MRI ABDOMEN WITHOUT CONTRAST  (INCLUDING MRCP) TECHNIQUE: Multiplanar multisequence MR imaging of the abdomen was performed. Heavily T2-weighted images of the biliary and  pancreatic ducts were  obtained, and three-dimensional MRCP images were rendered by post processing. COMPARISON:  Ultrasound or nuclear medicine hepatobiliary study of 07/13/2016. FINDINGS: Mild to moderate motion degradation throughout. Lower chest: Left larger than right pleural effusions with bibasilar airspace disease. Normal heart size without pericardial or pleural effusion. Hepatobiliary: No focal liver lesion. No intrahepatic biliary duct dilatation. Gallbladder sludge with mild gallbladder distension. Gallbladder wall thickening including at 10 mm on image 27/series 4. Mild pericholecystic fluid, including on image 25/series 5. No evidence of common duct stone. Pancreas: Mild pancreatic atrophy, without duct dilatation or mass. Limited evaluation for acute pancreatitis, secondary to relatively diffuse abdominal edema. Spleen: Mild splenomegaly, 14.0 cm craniocaudal. Adrenals/Urinary Tract: Normal adrenal glands. Bilateral renal lesions which are likely cysts. No hydronephrosis. Stomach/Bowel: Normal stomach and abdominal small bowel loops. There is wall thickening of and edema within both the ascending and descending colon, including on images 21/series 3 and 44/series 5. Vascular/Lymphatic: Normal aortic caliber. No retroperitoneal or retrocrural adenopathy. Other: Small volume ascites.  Diffuse anasarca. Musculoskeletal: Heterogeneous marrow signal with is foci of T2 hyperintensity within the lower lumbar and lower thoracic spine. IMPRESSION: 1. Motion degraded exam. 2. No biliary duct dilatation or choledocholithiasis. 3. Gallbladder distension with wall thickening and pericholecystic fluid. Concurrent gallbladder sludge. Findings are suspicious for acute cholecystitis. 4. Bilateral pleural effusions and ascites, suggesting fluid overload. 5. Heterogeneous marrow signal with foci of T2 hyperintensity within. Likely related to the clinical history of multiple myeloma. 6. Multi focal colitis.  Distribution  favors infection. Electronically Signed   By: Abigail Miyamoto M.D.   On: 07/17/2016 07:55    Anti-infectives: Anti-infectives    Start     Dose/Rate Route Frequency Ordered Stop   07/15/16 1800  aztreonam (AZACTAM) 1 g in dextrose 5 % 50 mL IVPB     1 g 100 mL/hr over 30 Minutes Intravenous Every 24 hours 07/14/16 1232     07/15/16 1200  vancomycin (VANCOCIN) IVPB 750 mg/150 ml premix  Status:  Discontinued     750 mg 150 mL/hr over 60 Minutes Intravenous Every T-Th-Sa (Hemodialysis) 07/14/16 1232 07/15/16 1445   07/14/16 1900  vancomycin (VANCOCIN) IVPB 750 mg/150 ml premix  Status:  Discontinued     750 mg 150 mL/hr over 60 Minutes Intravenous Every 24 hours 07/13/16 1740 07/14/16 1232   07/14/16 1800  fluconazole (DIFLUCAN) IVPB 200 mg  Status:  Discontinued     200 mg 100 mL/hr over 60 Minutes Intravenous Every 24 hours 07/13/16 1701 07/14/16 0949   07/14/16 1500  levofloxacin (LEVAQUIN) IVPB 500 mg  Status:  Discontinued     500 mg 100 mL/hr over 60 Minutes Intravenous Every 48 hours 07/13/16 1731 07/14/16 0949   07/13/16 2200  metroNIDAZOLE (FLAGYL) IVPB 500 mg     500 mg 100 mL/hr over 60 Minutes Intravenous Every 8 hours 07/13/16 1732     07/13/16 1745  aztreonam (AZACTAM) 1 g in dextrose 5 % 50 mL IVPB  Status:  Discontinued     1 g 100 mL/hr over 30 Minutes Intravenous Every 8 hours 07/13/16 1731 07/14/16 1232   07/13/16 1730  fluconazole (DIFLUCAN) IVPB 400 mg     400 mg 100 mL/hr over 120 Minutes Intravenous  Once 07/13/16 1701 07/13/16 2251   07/13/16 1700  vancomycin (VANCOCIN) 1,500 mg in sodium chloride 0.9 % 500 mL IVPB     1,500 mg 250 mL/hr over 120 Minutes Intravenous  Once 07/13/16 1651 07/13/16 1951   07/13/16 1445  ciprofloxacin (CIPRO)  IVPB 400 mg     400 mg 200 mL/hr over 60 Minutes Intravenous  Once 07/13/16 1434 07/13/16 1555   07/13/16 1445  metroNIDAZOLE (FLAGYL) IVPB 500 mg     500 mg 100 mL/hr over 60 Minutes Intravenous  Once 07/13/16 1434 07/13/16  1721      Assessment/Plan: Principal Problem:   Abdominal pain Active Problems:   Multiple myeloma ([place])   ESRD on dialysis (HCC)   Thrombocytopenia (HCC)   Anemia of renal disease   N&V (nausea and vomiting)   Sepsis (HCC)   Acute respiratory failure with hypoxia (HCC)   Elevated LFTs  Acalculous cholecystitis  Colitis  MRI/MRCP showed findings c/w cholecystitis, no stones, no CBD stones along with asc and des colitis.   Discussed findings withpt and family  rec IV abx for bacteremia, cholecystitis, colitis Rec percutaneous cholecystostomy tube for gb.  Discussed rationale for tube and it's general mgmt. Advised them that I would consult radiology for tube placement but ultimately their call for placement given his thrombocytopenia. Advised them that rads may want plt transfused or deem him not a candidate and may just have to be treated with IV abx.   NPO for possible GB tube placement  PT/OT consult pulm toilet  Leighton Ruff. Redmond Pulling, MD, FACS General, Bariatric, & Minimally Invasive Surgery [facility], [place]   LOS: 4 days    Gayland Curry 07/17/2016

## 2016-07-17 NOTE — Sedation Documentation (Signed)
Patient is resting comfortably. Vitals stable, sleeping at this time.

## 2016-07-17 NOTE — Progress Notes (Signed)
OT Cancellation Note  Patient Details Name: Thomas [name] MRN: [id number] DOB: [birth year]   Cancelled Treatment:    Reason Eval/Treat Not Completed: Patient at procedure or test/ unavailable. Will check back as schedule allows for OT evaluation. Thank you for the order.   Chrys Racer , MS, OTR/L, CLT Pager: [phone]  07/17/2016, 3:54 PM

## 2016-07-17 NOTE — Sedation Documentation (Signed)
Patient is resting comfortably. 

## 2016-07-18 ENCOUNTER — Encounter (HOSPITAL_COMMUNITY): Payer: Self-pay | Admitting: General Practice

## 2016-07-18 DIAGNOSIS — E43 Unspecified severe protein-calorie malnutrition: Secondary | ICD-10-CM

## 2016-07-18 DIAGNOSIS — D61810 Antineoplastic chemotherapy induced pancytopenia: Secondary | ICD-10-CM

## 2016-07-18 DIAGNOSIS — D689 Coagulation defect, unspecified: Secondary | ICD-10-CM

## 2016-07-18 LAB — PREPARE PLATELET PHERESIS
Unit division: 0
Unit division: 0

## 2016-07-18 LAB — CBC WITH DIFFERENTIAL/PLATELET
BASOS PCT: 0 %
Basophils Absolute: 0 10*3/uL (ref 0.0–0.1)
Eosinophils Absolute: 0 10*3/uL (ref 0.0–0.7)
Eosinophils Relative: 0 %
HEMATOCRIT: 24.9 % — AB (ref 39.0–52.0)
Hemoglobin: 8.1 g/dL — ABNORMAL LOW (ref 13.0–17.0)
LYMPHS PCT: 7 %
Lymphs Abs: 0.4 10*3/uL — ABNORMAL LOW (ref 0.7–4.0)
MCH: 29.6 pg (ref 26.0–34.0)
MCHC: 32.5 g/dL (ref 30.0–36.0)
MCV: 90.9 fL (ref 78.0–100.0)
MONO ABS: 0.8 10*3/uL (ref 0.1–1.0)
MONOS PCT: 14 %
NEUTROS ABS: 4.5 10*3/uL (ref 1.7–7.7)
Neutrophils Relative %: 79 %
Platelets: 55 10*3/uL — ABNORMAL LOW (ref 150–400)
RBC: 2.74 MIL/uL — ABNORMAL LOW (ref 4.22–5.81)
RDW: 16.1 % — AB (ref 11.5–15.5)
WBC: 5.7 10*3/uL (ref 4.0–10.5)

## 2016-07-18 LAB — CULTURE, BLOOD (ROUTINE X 2): Culture: NO GROWTH

## 2016-07-18 LAB — COMPREHENSIVE METABOLIC PANEL
ALT: 17 U/L (ref 17–63)
ANION GAP: 8 (ref 5–15)
AST: 17 U/L (ref 15–41)
Albumin: 1.7 g/dL — ABNORMAL LOW (ref 3.5–5.0)
Alkaline Phosphatase: 255 U/L — ABNORMAL HIGH (ref 38–126)
BILIRUBIN TOTAL: 3.1 mg/dL — AB (ref 0.3–1.2)
BUN: 45 mg/dL — ABNORMAL HIGH (ref 6–20)
CO2: 25 mmol/L (ref 22–32)
Calcium: 7.6 mg/dL — ABNORMAL LOW (ref 8.9–10.3)
Chloride: 100 mmol/L — ABNORMAL LOW (ref 101–111)
Creatinine, Ser: 3.39 mg/dL — ABNORMAL HIGH (ref 0.61–1.24)
GFR, EST AFRICAN AMERICAN: 20 mL/min — AB (ref >60)
GFR, EST NON AFRICAN AMERICAN: 17 mL/min — AB (ref >60)
Glucose, Bld: 141 mg/dL — ABNORMAL HIGH (ref 65–99)
POTASSIUM: 3.5 mmol/L (ref 3.5–5.1)
Sodium: 133 mmol/L — ABNORMAL LOW (ref 135–145)
TOTAL PROTEIN: 4.8 g/dL — AB (ref 6.5–8.1)

## 2016-07-18 MED ORDER — ENSURE ENLIVE PO LIQD
237.0000 mL | Freq: Two times a day (BID) | ORAL | Status: DC
  Administered 2016-07-18 – 2016-07-19 (×2): 237 mL via ORAL

## 2016-07-18 MED ORDER — LEVOFLOXACIN 500 MG PO TABS
500.0000 mg | ORAL_TABLET | ORAL | Status: DC
  Administered 2016-07-20 – 2016-08-01 (×7): 500 mg via ORAL
  Filled 2016-07-18 (×9): qty 1

## 2016-07-18 MED ORDER — LEVOFLOXACIN 750 MG PO TABS
750.0000 mg | ORAL_TABLET | Freq: Once | ORAL | Status: AC
  Administered 2016-07-18: 750 mg via ORAL
  Filled 2016-07-18: qty 1

## 2016-07-18 MED ORDER — SODIUM CHLORIDE 0.9 % IV SOLN: Freq: Once | INTRAVENOUS | Status: DC

## 2016-07-18 MED ORDER — BENEPROTEIN PO POWD
1.0000 | Freq: Three times a day (TID) | ORAL | Status: DC
  Administered 2016-07-18 – 2016-08-02 (×15): 6 g via ORAL
  Filled 2016-07-18 (×3): qty 227

## 2016-07-18 NOTE — Evaluation (Deleted)
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 He presented on  8/3 with one-day history of nausea, vomiting and abdominal pain Pt did have percutaneous cholecystomy drain placed 8/7.    Clinical Impression  Pt admitted with above diagnosis. Pt currently with functional limitations due to the deficits listed below (see PT Problem List). Pt was agitated and lethargic upon PT arrival today and gave limited participation. Was not willing to move from supine to EOB so limited bed exercises were completed; limited muscle activity in L leg. Strongly encourage pt the importance of moving while in acute setting to prevent further decreases in strength. PT will see Thomas [name] tomorrow with the goal of standing but must continue encouragement for patient participation. Pt will benefit from skilled PT to increase their independence and safety with mobility to allow discharge to the venue listed below.        Follow Up Recommendations Other (comment) (Post-Acute Rehab; Dependent on patients functional capacity; pt family likely to decline SNF)    Equipment Recommendations       Recommendations for Other Services       Precautions / Restrictions Precautions Precaution Comments: drain R side Restrictions Weight Bearing Restrictions: No      Mobility  Bed Mobility               General bed mobility comments: refused to move EOB; wanted to continue resting due to pain;  Transfers                 General transfer comment: Pt was agitated today and was persistent  on not wanting to move from laying in bed  Ambulation/Gait                Stairs            Wheelchair Mobility    Modified Rankin (Stroke Patients Only)       Balance                                             Pertinent Vitals/Pain Pain Assessment: Faces Pain Descriptors / Indicators: Discomfort    Home Living Family/patient expects to be discharged to:: Private residence Living Arrangements: Spouse/significant other Available Help at Discharge: Family;Available 24 hours/day Type of Home: House Home Access: Ramped entrance     Home Layout: One level Home Equipment: Bedside commode;Shower seat      Prior Function Level of Independence: Independent with assistive device(s)               Hand Dominance   Dominant Hand: Right    Extremity/Trunk Assessment   Upper Extremity Assessment: Generalized weakness                     Communication   Communication: No difficulties  Cognition Arousal/Alertness: Lethargic Behavior During Therapy: Agitated Overall Cognitive Status: Impaired/Different from baseline (family says this is not his 'normal self")  General Comments   Reported he could walk but unwilling to get up from bed; must continue encouragement in order to increase pt participation in PT.     Exercises General Exercises - Lower Extremity Ankle Circles/Pumps: AROM;Strengthening;Both;10 reps;Supine Quad Sets: AROM;Right;5 reps;Supine AAROM;Left;5 reps      Assessment/Plan    PT Assessment    PT Diagnosis Generalized weakness   PT Problem List    PT Treatment Interventions     PT Goals (Current goals can be found in the Care Plan section) Acute Rehab PT Goals Patient Stated Goal: did not state    Frequency     Barriers to discharge        Co-evaluation               End of Session   Activity Tolerance: Patient limited by fatigue;Patient limited by  lethargy;Treatment limited secondary to agitation Patient left: in bed;with call bell/phone within reach;with family/visitor present           Time: 7737-3668 PT Time Calculation (min) (ACUTE ONLY): 25 min   Charges:         PT G Codes:       Thomas [name], SPT Acute Rehabilitation Services  Thomas [name] 07/18/2016, 3:13 PM

## 2016-07-18 NOTE — Progress Notes (Signed)
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 He presented on  8/3 with one-day history of nausea, vomiting and abdominal pain and some preceding diarrhea prior to that. Developed septic shock after days of diarrhea Reports decreased ability to tolerate oral intake. In the emergency room he was noted to be hypotensive and ultrasound suggestive of acalculous cholecystitis-seen by surgery and awaiting HIDA scan. He remained hypotensive upon transfer to the ICU and was started on pressors.   Assessment and plan Hypotension > resolved, off all vasopressors  Sepsis - initial suspicion for gallbladder as source. Of note, pt is immunocompromised. -Patient evaluated by general surgery, liver function is improving, patient may have possible CBD stone. Presentation concerning for acalculous cholecystitis  , surgery recommends percutaneous drain and not a cholecystectomy at this time,Percutaneous cholecystostomy drain placed 8/7 in IR  Continue Midodrine 5 mg TID, blood pressure stable vancomycin discontinued 8/5, also discontinued Flagyl and affect him, patient now switched to levofloxacin for a total of 3 weeks for Klebsiella pneumonia bacteremia    MRI/MRCP showed findings c/w  cholecystitis, no stones, no CBD stones along with asc and des colitis   Thrombocytopenia-in the setting of multiple myeloma, received 2 units of packed red blood cell transfusions prior to IR procedure Serum light chain has dropped significantly to near normal ratio Due to recent pancytopenia, treatment is placed on hold Pancytopenia has improved since discontinuation of Revlimid.   anemia is related to chronic renal failure. He is getting ESA through dialysis center Discussed with Heath Lark, MD , no further platelet transfusions indicated at this time,   Transaminitis - improving, passed biliary stone?Percutaneous cholecystostomy drain placed 8/7 in IR Appreciate surgery, IR, GI, HH RN will need to flush drain 1 x/day with 5-10 cc sterile saline Drain to remain 6-8 weeks Plan to see IR OP [facility] - unless goes to OR Blood cultures are positive for  Klebsiella pneumonia ,sepsis is resolving.    End-stage renal disease: Secondary to multiple myeloma and getting hemodialysis via right IJ tunneled dialysis catheter- last done on Tuesday. .    Continue with bladder scans, in and out cath when needed - refusing in and out caths- "they are not giving me enough time to go " attempted to explain the rationale of not letting liters of urine stay in bladder to no avail. Nephrology following and Dr. Jonnie Finner to decide if patient is going to need hemodialysis     Multiple myeloma-On chemotherapy/radiation to left femur-further management per hematology   DVT prophylaxsis SCDs  Code Status:  Full code    Family Communication: Discussed in detail with the patient, wife, daughter, all imaging results, lab results explained to the patient   Disposition Plan:   Anticipate discharge in  one to 2 days with home health     Consultants:  Critical care  General surgery  Nephrology   STUDIES:  abd u/s 8/3>>> Distended gallbladder with mild gallbladder wall thickening and gallbladder  sludge. Could not exclude acalculous cholecystitis.  Normal caliber common bile duct.  Small amount of right upper quadrant ascites and right pleural effusion.  CULTURES: BC x 2 8/3>>>   ANTIBIOTICS: Flagyl 8/3>>> vanc 8/3>>> 8/5 Diflucan 8/3>>>8/4 Levaquin 8/3>>>8/4 Aztreonam 8/3>>>     LINES/TUBES: R IJ Perm cath 6/1>>>       HPI/Subjective: More awake and alert, conversive, low-grade fever  Objective: Vitals:   07/17/16 2334 07/18/16 0142 07/18/16 0451 07/18/16 1000  BP: 125/65 (!) 145/65 127/66 136/64  Pulse: (!) 101 (!) 101 95 95  Resp:   14 18  Temp: (!) 100.4 F (38 C) 99.4 F (37.4 C) 99.7 F (37.6 C) 99.5 F (37.5 C)  TempSrc: Oral Oral Oral Oral  SpO2: 93% 96% 97% 98%  Weight:        Intake/Output Summary (Last 24 hours) at 07/18/16 1134 Last data filed at 07/18/16 0900  Gross per 24 hour  Intake              991 ml  Output              820 ml  Net              171 ml    Exam:  Examination:  General exam: Appears calm and comfortable  Respiratory system: Clear to auscultation. Respiratory effort normal. Cardiovascular system: S1 & S2 heard, RRR. No JVD, murmurs, rubs, gallops or clicks. No pedal edema. Gastrointestinal system: Abdomen is nondistended, soft and nontender. No organomegaly or masses felt. Normal bowel sounds heard. Central nervous system: Alert and oriented. No focal neurological deficits. Extremities: Symmetric 5 x 5 power. Skin: No rashes, lesions or ulcers Psychiatry: Judgement and insight appear normal. Mood & affect appropriate.     Data Reviewed: I have personally reviewed following labs and imaging studies  Micro Results Recent Results (from the past 240 hour(s))  Culture, blood (routine x 2)     Status: None   Collection Time: 07/13/16  4:20 PM  Result Value Ref Range Status   Specimen Description BLOOD RIGHT WRIST  Final   Special Requests BOTTLES DRAWN AEROBIC AND ANAEROBIC 5CC  Final   Culture NO GROWTH 5  DAYS  Final   Report Status 07/18/2016 FINAL  Final  Culture, blood (routine x 2)     Status: Abnormal   Collection Time: 07/13/16  5:04 PM  Result Value Ref Range Status   Specimen Description BLOOD RIGHT HAND BAER  Final   Special Requests 3CC  Final   Culture  Setup Time   Final    GRAM NEGATIVE RODS AEROBIC BOTTLE ONLY CRITICAL RESULT CALLED TO, READ BACK BY AND VERIFIED WITH: M MACCIA,PHARMD AT 1006 07/15/16 BY L BENFIELD    Culture KLEBSIELLA PNEUMONIAE (A)  Final   Report Status 07/17/2016 FINAL  Final   Organism ID, Bacteria KLEBSIELLA PNEUMONIAE  Final      Susceptibility   Klebsiella pneumoniae - MIC*    AMPICILLIN >=32 RESISTANT Resistant     CEFAZOLIN <=4 SENSITIVE Sensitive     CEFEPIME <=1 SENSITIVE Sensitive     CEFTAZIDIME <=1 SENSITIVE Sensitive     CEFTRIAXONE <=1 SENSITIVE Sensitive     CIPROFLOXACIN 1 SENSITIVE Sensitive     GENTAMICIN <=1 SENSITIVE Sensitive  IMIPENEM <=0.25 SENSITIVE Sensitive     TRIMETH/SULFA 40 SENSITIVE Sensitive     AMPICILLIN/SULBACTAM >=32 RESISTANT Resistant     PIP/TAZO 16 SENSITIVE Sensitive     Extended ESBL NEGATIVE Sensitive     * KLEBSIELLA PNEUMONIAE  Blood Culture ID Panel (Reflexed)     Status: Abnormal   Collection Time: 07/13/16  5:04 PM  Result Value Ref Range Status   Enterococcus species NOT DETECTED NOT DETECTED Final   Vancomycin resistance NOT DETECTED NOT DETECTED Final   Listeria monocytogenes NOT DETECTED NOT DETECTED Final   Staphylococcus species NOT DETECTED NOT DETECTED Final   Staphylococcus aureus NOT DETECTED NOT DETECTED Final   Methicillin resistance NOT DETECTED NOT DETECTED Final   Streptococcus species NOT DETECTED NOT DETECTED Final   Streptococcus agalactiae NOT DETECTED NOT DETECTED Final   Streptococcus pneumoniae NOT DETECTED NOT DETECTED Final   Streptococcus pyogenes NOT DETECTED NOT DETECTED Final   Acinetobacter baumannii NOT DETECTED NOT DETECTED Final   Enterobacteriaceae species  DETECTED (A) NOT DETECTED Final    Comment: CRITICAL RESULT CALLED TO, READ BACK BY AND VERIFIED WITH: M MACCIA,PHARMD AT 1006 07/15/16 BY L BENFIELD    Enterobacter cloacae complex NOT DETECTED NOT DETECTED Final   Escherichia coli NOT DETECTED NOT DETECTED Final   Klebsiella oxytoca NOT DETECTED NOT DETECTED Final   Klebsiella pneumoniae DETECTED (A) NOT DETECTED Final    Comment: CRITICAL RESULT CALLED TO, READ BACK BY AND VERIFIED WITH: M MACCIA,PHARMD AT 1006 07/15/16 BY L BENFIELD    Proteus species NOT DETECTED NOT DETECTED Final   Serratia marcescens NOT DETECTED NOT DETECTED Final   Carbapenem resistance NOT DETECTED NOT DETECTED Final   Haemophilus influenzae NOT DETECTED NOT DETECTED Final   Neisseria meningitidis NOT DETECTED NOT DETECTED Final   Pseudomonas aeruginosa NOT DETECTED NOT DETECTED Final   Candida albicans NOT DETECTED NOT DETECTED Final   Candida glabrata NOT DETECTED NOT DETECTED Final   Candida krusei NOT DETECTED NOT DETECTED Final   Candida parapsilosis NOT DETECTED NOT DETECTED Final   Candida tropicalis NOT DETECTED NOT DETECTED Final  MRSA PCR Screening     Status: None   Collection Time: 07/13/16  6:31 PM  Result Value Ref Range Status   MRSA by PCR NEGATIVE NEGATIVE Final    Comment:        The GeneXpert MRSA Assay (FDA approved for NASAL specimens only), is one component of a comprehensive MRSA colonization surveillance program. It is not intended to diagnose MRSA infection nor to guide or monitor treatment for MRSA infections.   Aerobic/Anaerobic Culture (surgical/deep wound)     Status: None (Preliminary result)   Collection Time: 07/17/16  5:18 PM  Result Value Ref Range Status   Specimen Description DRAINAGE BILE  Final   Special Requests Normal  Final   Gram Stain   Final    RARE WBC PRESENT, PREDOMINANTLY MONONUCLEAR RARE GRAM NEGATIVE RODS    Culture PENDING  Incomplete   Report Status PENDING  Incomplete    Radiology  Reports Nm Hepatobiliary Liver Func  Result Date: 07/14/2016 CLINICAL DATA:  Right upper quadrant pain. Abnormal sonographic appearance of the gallbladder. EXAM: NUCLEAR MEDICINE HEPATOBILIARY IMAGING TECHNIQUE: Sequential images of the abdomen were obtained out to 120 minutes following intravenous administration of radiopharmaceutical. After IV administered of 3 mg morphine, imaging obtained for an additional 30 minutes. RADIOPHARMACEUTICALS:  5.2  mCi Tc-55m Choletec IV COMPARISON:  Right upper quadrant ultrasound yesterday. FINDINGS: Imaging obtained for 2  hours and 30 minutes. There is mildly delayed hepatic uptake. There is no visualized tracer accumulation within the intrahepatic ducts. At 35 minutes tracer is visualized in the midline, which may be duodenum or common bile duct. There is midline tracer increases in intensity with to and fro trace accumulation proximal and distally likely refluxed into the stomach and passing distally proximal small bowel. After 120 minutes, no gallbladder activity. As small bowel activity was visualized, 3 mg of morphine was administered. Increased GI tract activity, no gallbladder visualization after morphine. IMPRESSION: Gallbladder not visualized after 2.5 hours as well as the administration of morphine. In addition however the intra and extrahepatic bile ducts are not well seen. Nonvisualization of the gallbladder may be secondary to cholecystitis versus underlying hepatocellular dysfunction (patient bilirubin greater than 3). Limited visualization of the biliary tree activity is likely secondary to hepatic cellular dysfunction. Biliary obstruction is felt much less likely as GI tract activity is observed. Electronically Signed   By: Jeb Levering M.D.   On: 07/14/2016 02:59   Mr Abdomen Mrcp Wo Cm  Result Date: 07/17/2016 CLINICAL DATA:  Right upper quadrant pain. Elevated liver function tests. History of multiple myeloma. EXAM: MRI ABDOMEN WITHOUT CONTRAST   (INCLUDING MRCP) TECHNIQUE: Multiplanar multisequence MR imaging of the abdomen was performed. Heavily T2-weighted images of the biliary and pancreatic ducts were obtained, and three-dimensional MRCP images were rendered by post processing. COMPARISON:  Ultrasound or nuclear medicine hepatobiliary study of 07/13/2016. FINDINGS: Mild to moderate motion degradation throughout. Lower chest: Left larger than right pleural effusions with bibasilar airspace disease. Normal heart size without pericardial or pleural effusion. Hepatobiliary: No focal liver lesion. No intrahepatic biliary duct dilatation. Gallbladder sludge with mild gallbladder distension. Gallbladder wall thickening including at 10 mm on image 27/series 4. Mild pericholecystic fluid, including on image 25/series 5. No evidence of common duct stone. Pancreas: Mild pancreatic atrophy, without duct dilatation or mass. Limited evaluation for acute pancreatitis, secondary to relatively diffuse abdominal edema. Spleen: Mild splenomegaly, 14.0 cm craniocaudal. Adrenals/Urinary Tract: Normal adrenal glands. Bilateral renal lesions which are likely cysts. No hydronephrosis. Stomach/Bowel: Normal stomach and abdominal small bowel loops. There is wall thickening of and edema within both the ascending and descending colon, including on images 21/series 3 and 44/series 5. Vascular/Lymphatic: Normal aortic caliber. No retroperitoneal or retrocrural adenopathy. Other: Small volume ascites.  Diffuse anasarca. Musculoskeletal: Heterogeneous marrow signal with is foci of T2 hyperintensity within the lower lumbar and lower thoracic spine. IMPRESSION: 1. Motion degraded exam. 2. No biliary duct dilatation or choledocholithiasis. 3. Gallbladder distension with wall thickening and pericholecystic fluid. Concurrent gallbladder sludge. Findings are suspicious for acute cholecystitis. 4. Bilateral pleural effusions and ascites, suggesting fluid overload. 5. Heterogeneous marrow  signal with foci of T2 hyperintensity within. Likely related to the clinical history of multiple myeloma. 6. Multi focal colitis.  Distribution favors infection. Electronically Signed   By: Abigail Miyamoto M.D.   On: 07/17/2016 07:55   Mr 3d Recon At Scanner  Result Date: 07/17/2016 CLINICAL DATA:  Right upper quadrant pain. Elevated liver function tests. History of multiple myeloma. EXAM: MRI ABDOMEN WITHOUT CONTRAST  (INCLUDING MRCP) TECHNIQUE: Multiplanar multisequence MR imaging of the abdomen was performed. Heavily T2-weighted images of the biliary and pancreatic ducts were obtained, and three-dimensional MRCP images were rendered by post processing. COMPARISON:  Ultrasound or nuclear medicine hepatobiliary study of 07/13/2016. FINDINGS: Mild to moderate motion degradation throughout. Lower chest: Left larger than right pleural effusions with bibasilar airspace disease. Normal heart  size without pericardial or pleural effusion. Hepatobiliary: No focal liver lesion. No intrahepatic biliary duct dilatation. Gallbladder sludge with mild gallbladder distension. Gallbladder wall thickening including at 10 mm on image 27/series 4. Mild pericholecystic fluid, including on image 25/series 5. No evidence of common duct stone. Pancreas: Mild pancreatic atrophy, without duct dilatation or mass. Limited evaluation for acute pancreatitis, secondary to relatively diffuse abdominal edema. Spleen: Mild splenomegaly, 14.0 cm craniocaudal. Adrenals/Urinary Tract: Normal adrenal glands. Bilateral renal lesions which are likely cysts. No hydronephrosis. Stomach/Bowel: Normal stomach and abdominal small bowel loops. There is wall thickening of and edema within both the ascending and descending colon, including on images 21/series 3 and 44/series 5. Vascular/Lymphatic: Normal aortic caliber. No retroperitoneal or retrocrural adenopathy. Other: Small volume ascites.  Diffuse anasarca. Musculoskeletal: Heterogeneous marrow signal with  is foci of T2 hyperintensity within the lower lumbar and lower thoracic spine. IMPRESSION: 1. Motion degraded exam. 2. No biliary duct dilatation or choledocholithiasis. 3. Gallbladder distension with wall thickening and pericholecystic fluid. Concurrent gallbladder sludge. Findings are suspicious for acute cholecystitis. 4. Bilateral pleural effusions and ascites, suggesting fluid overload. 5. Heterogeneous marrow signal with foci of T2 hyperintensity within. Likely related to the clinical history of multiple myeloma. 6. Multi focal colitis.  Distribution favors infection. Electronically Signed   By: Abigail Miyamoto M.D.   On: 07/17/2016 07:55   Ir Perc Cholecystostomy  Result Date: 07/17/2016 CLINICAL DATA:  Ultrasound, MRCP, and hepatobiliary scintigraphy suggest acute cholecystitis. Thrombocytopenia. EXAM: PERCUTANEOUS CHOLECYSTOSTOMY TUBE PLACEMENT WITH ULTRASOUND AND FLUOROSCOPIC GUIDANCE FLUOROSCOPY TIME:  36 seconds, 6 mGy TECHNIQUE: The procedure, risks (including but not limited to bleeding, infection, organ damage ), benefits, and alternatives were explained to the patient. Questions regarding the procedure were encouraged and answered. The patient understands and consents to the procedure. Survey ultrasound of the abdomen was performed and an appropriate skin entry site was identified. Skin site was marked, prepped with Betadine, and draped in usual sterile fashion, and infiltrated locally with 1% lidocaine.Intravenous Fentanyl and Versed were administered as conscious sedation during continuous monitoring of the patient's level of consciousness and physiological / cardiorespiratory status by AV the radiology RN, with a total moderate sedation time of 10 minutes. Under real-time ultrasound guidance, gallbladder was accessed using a transhepatic approach with a 21-gauge needle. Ultrasound image documentation was saved. Bile returned through the hub. Needle was exchanged over a 018 guidewire for  transitional dilator which allowed placement of 035 J wire. Over this, a 10.2 French pigtail catheter was advanced and formed centrally in the gallbladder lumen. Small contrast injection confirmed appropriate position. Catheter secured externally with 0 Prolene suture and placed external drain bag. Patient tolerated the procedure well. COMPLICATIONS: COMPLICATIONS none IMPRESSION: 1. Technically successful percutaneous cholecystostomy tube placement with ultrasound and fluoroscopic guidance. Electronically Signed   By: Lucrezia Europe M.D.   On: 07/17/2016 17:15   Dg Chest Port 1 View  Result Date: 07/13/2016 CLINICAL DATA:  Effusion. Abnormal labs.Hx of CKD, dialysis patient.Ex-smoker, quit ~30 years ago. EXAM: PORTABLE CHEST 1 VIEW COMPARISON:  05/24/2016 FINDINGS: Cardiac silhouette is normal in size. No mediastinal or hilar masses or evidence of adenopathy. There is vascular congestion. Left greater than right pleural effusions are noted. There is some hazy opacity in the lung bases, likely due to atelectasis. No pneumothorax. Right internal jugular dual-lumen central venous catheter is stable and well positioned. IMPRESSION: 1. Small, left greater than right, pleural effusions with associated lung base opacity, most likely atelectasis. 2. Central vascular congestion without  overt pulmonary edema. Electronically Signed   By: Lajean Manes M.D.   On: 07/13/2016 17:27   US Abdomen Limited Ruq  Result Date: 07/13/2016 CLINICAL DATA:  Right upper quadrant pain for 2 days. Nausea and vomiting for 1 day. EXAM: US ABDOMEN LIMITED - RIGHT UPPER QUADRANT COMPARISON:  None. FINDINGS: Gallbladder: The gallbladder is mildly distended measuring 5.2 cm in transverse dimension. This also mild gallbladder wall thickening and gallbladder sludge. No definite gallstones or pericholecystic fluid. Negative sonographic Murphy sign. Common bile duct: Diameter: 4.1 mm Liver: Normal echogenicity without focal lesion or biliary  dilatation. Small amount of right upper quadrant ascites. Right pleural effusion noted. IMPRESSION: 1. Distended gallbladder with mild gallbladder wall thickening and gallbladder sludge. Could not exclude acalculous cholecystitis. 2. Normal caliber common bile duct. 3. Normal liver. 4. Small amount of right upper quadrant ascites and right pleural effusion. Electronically Signed   By: Marijo Sanes M.D.   On: 07/13/2016 12:05     CBC  Recent Labs Lab 07/13/16 0810  07/15/16 0222 07/16/16 0744 07/17/16 0504 07/17/16 1429 07/18/16 0531  WBC 13.6*  < > 19.1* 15.6* 10.2 8.7 5.7  HGB 8.6*  < > 6.8* 8.2* 8.2* 8.0* 8.1*  HCT 26.6*  < > 19.8* 24.3* 24.6* 24.0* 24.9*  PLT 86*  < > 38* 27* 19* 43* 55*  MCV 92.1  < > 88.8 88.0 88.8 88.9 90.9  MCH 29.9  < > 30.5 29.7 29.6 29.6 29.6  MCHC 32.5  < > 34.3 33.7 33.3 33.3 32.5  RDW 16.6*  < > 15.9* 16.2* 16.0* 16.1* 16.1*  LYMPHSABS 0.2*  --   --  0.2*  --   --  0.4*  MONOABS 0.7  --   --  0.6  --   --  0.8  EOSABS 0.0  --   --  0.0  --   --  0.0  BASOSABS 0.0  --   --  0.0  --   --  0.0  < > = values in this interval not displayed.  Chemistries   Recent Labs Lab 07/14/16 0300 07/15/16 0222 07/15/16 0840 07/16/16 0744 07/17/16 0504 07/18/16 0531  NA 132* 128*  --  132* 133* 133*  K 3.8 3.6  --  3.6 3.6 3.5  CL 99* 98*  --  97* 99* 100*  CO2 21* 20*  --  26 26 25   GLUCOSE 198* 153*  --  129* 142* 141*  BUN 35* 51*  --  24* 35* 45*  CREATININE 4.08* 4.26*  --  2.48* 3.02* 3.39*  CALCIUM 7.2* 7.3*  --  7.4* 7.6* 7.6*  MG 1.2* 2.1  --   --   --   --   AST 44*  --  30 38 20 17  ALT 51  --  38 34 25 17  ALKPHOS 226*  --  187* 298* 295* 255*  BILITOT 5.0*  --  2.9* 3.7* 3.5* 3.1*   ------------------------------------------------------------------------------------------------------------------ estimated creatinine clearance is 22.4 mL/min (by C-G formula based on SCr of 3.39  mg/dL). ------------------------------------------------------------------------------------------------------------------ No results for input(s): HGBA1C in the last 72 hours. ------------------------------------------------------------------------------------------------------------------ No results for input(s): CHOL, HDL, LDLCALC, TRIG, CHOLHDL, LDLDIRECT in the last 72 hours. ------------------------------------------------------------------------------------------------------------------ No results for input(s): TSH, T4TOTAL, T3FREE, THYROIDAB in the last 72 hours.  Invalid input(s): FREET3 ------------------------------------------------------------------------------------------------------------------ No results for input(s): VITAMINB12, FOLATE, FERRITIN, TIBC, IRON, RETICCTPCT in the last 72 hours.  Coagulation profile  Recent Labs Lab 07/17/16 0932  INR 1.65  No results for input(s): DDIMER in the last 72 hours.  Cardiac Enzymes No results for input(s): CKMB, TROPONINI, MYOGLOBIN in the last 168 hours.  Invalid input(s): CK ------------------------------------------------------------------------------------------------------------------ Invalid input(s): POCBNP   CBG: No results for input(s): GLUCAP in the last 168 hours.     Studies: Mr Abdomen Mrcp Wo Cm  Result Date: 07/17/2016 CLINICAL DATA:  Right upper quadrant pain. Elevated liver function tests. History of multiple myeloma. EXAM: MRI ABDOMEN WITHOUT CONTRAST  (INCLUDING MRCP) TECHNIQUE: Multiplanar multisequence MR imaging of the abdomen was performed. Heavily T2-weighted images of the biliary and pancreatic ducts were obtained, and three-dimensional MRCP images were rendered by post processing. COMPARISON:  Ultrasound or nuclear medicine hepatobiliary study of 07/13/2016. FINDINGS: Mild to moderate motion degradation throughout. Lower chest: Left larger than right pleural effusions with bibasilar airspace  disease. Normal heart size without pericardial or pleural effusion. Hepatobiliary: No focal liver lesion. No intrahepatic biliary duct dilatation. Gallbladder sludge with mild gallbladder distension. Gallbladder wall thickening including at 10 mm on image 27/series 4. Mild pericholecystic fluid, including on image 25/series 5. No evidence of common duct stone. Pancreas: Mild pancreatic atrophy, without duct dilatation or mass. Limited evaluation for acute pancreatitis, secondary to relatively diffuse abdominal edema. Spleen: Mild splenomegaly, 14.0 cm craniocaudal. Adrenals/Urinary Tract: Normal adrenal glands. Bilateral renal lesions which are likely cysts. No hydronephrosis. Stomach/Bowel: Normal stomach and abdominal small bowel loops. There is wall thickening of and edema within both the ascending and descending colon, including on images 21/series 3 and 44/series 5. Vascular/Lymphatic: Normal aortic caliber. No retroperitoneal or retrocrural adenopathy. Other: Small volume ascites.  Diffuse anasarca. Musculoskeletal: Heterogeneous marrow signal with is foci of T2 hyperintensity within the lower lumbar and lower thoracic spine. IMPRESSION: 1. Motion degraded exam. 2. No biliary duct dilatation or choledocholithiasis. 3. Gallbladder distension with wall thickening and pericholecystic fluid. Concurrent gallbladder sludge. Findings are suspicious for acute cholecystitis. 4. Bilateral pleural effusions and ascites, suggesting fluid overload. 5. Heterogeneous marrow signal with foci of T2 hyperintensity within. Likely related to the clinical history of multiple myeloma. 6. Multi focal colitis.  Distribution favors infection. Electronically Signed   By: Abigail Miyamoto M.D.   On: 07/17/2016 07:55   Mr 3d Recon At Scanner  Result Date: 07/17/2016 CLINICAL DATA:  Right upper quadrant pain. Elevated liver function tests. History of multiple myeloma. EXAM: MRI ABDOMEN WITHOUT CONTRAST  (INCLUDING MRCP) TECHNIQUE:  Multiplanar multisequence MR imaging of the abdomen was performed. Heavily T2-weighted images of the biliary and pancreatic ducts were obtained, and three-dimensional MRCP images were rendered by post processing. COMPARISON:  Ultrasound or nuclear medicine hepatobiliary study of 07/13/2016. FINDINGS: Mild to moderate motion degradation throughout. Lower chest: Left larger than right pleural effusions with bibasilar airspace disease. Normal heart size without pericardial or pleural effusion. Hepatobiliary: No focal liver lesion. No intrahepatic biliary duct dilatation. Gallbladder sludge with mild gallbladder distension. Gallbladder wall thickening including at 10 mm on image 27/series 4. Mild pericholecystic fluid, including on image 25/series 5. No evidence of common duct stone. Pancreas: Mild pancreatic atrophy, without duct dilatation or mass. Limited evaluation for acute pancreatitis, secondary to relatively diffuse abdominal edema. Spleen: Mild splenomegaly, 14.0 cm craniocaudal. Adrenals/Urinary Tract: Normal adrenal glands. Bilateral renal lesions which are likely cysts. No hydronephrosis. Stomach/Bowel: Normal stomach and abdominal small bowel loops. There is wall thickening of and edema within both the ascending and descending colon, including on images 21/series 3 and 44/series 5. Vascular/Lymphatic: Normal aortic caliber. No retroperitoneal or retrocrural adenopathy. Other: Small  volume ascites.  Diffuse anasarca. Musculoskeletal: Heterogeneous marrow signal with is foci of T2 hyperintensity within the lower lumbar and lower thoracic spine. IMPRESSION: 1. Motion degraded exam. 2. No biliary duct dilatation or choledocholithiasis. 3. Gallbladder distension with wall thickening and pericholecystic fluid. Concurrent gallbladder sludge. Findings are suspicious for acute cholecystitis. 4. Bilateral pleural effusions and ascites, suggesting fluid overload. 5. Heterogeneous marrow signal with foci of T2  hyperintensity within. Likely related to the clinical history of multiple myeloma. 6. Multi focal colitis.  Distribution favors infection. Electronically Signed   By: Abigail Miyamoto M.D.   On: 07/17/2016 07:55   Ir Perc Cholecystostomy  Result Date: 07/17/2016 CLINICAL DATA:  Ultrasound, MRCP, and hepatobiliary scintigraphy suggest acute cholecystitis. Thrombocytopenia. EXAM: PERCUTANEOUS CHOLECYSTOSTOMY TUBE PLACEMENT WITH ULTRASOUND AND FLUOROSCOPIC GUIDANCE FLUOROSCOPY TIME:  36 seconds, 6 mGy TECHNIQUE: The procedure, risks (including but not limited to bleeding, infection, organ damage ), benefits, and alternatives were explained to the patient. Questions regarding the procedure were encouraged and answered. The patient understands and consents to the procedure. Survey ultrasound of the abdomen was performed and an appropriate skin entry site was identified. Skin site was marked, prepped with Betadine, and draped in usual sterile fashion, and infiltrated locally with 1% lidocaine.Intravenous Fentanyl and Versed were administered as conscious sedation during continuous monitoring of the patient's level of consciousness and physiological / cardiorespiratory status by AV the radiology RN, with a total moderate sedation time of 10 minutes. Under real-time ultrasound guidance, gallbladder was accessed using a transhepatic approach with a 21-gauge needle. Ultrasound image documentation was saved. Bile returned through the hub. Needle was exchanged over a 018 guidewire for transitional dilator which allowed placement of 035 J wire. Over this, a 10.2 French pigtail catheter was advanced and formed centrally in the gallbladder lumen. Small contrast injection confirmed appropriate position. Catheter secured externally with 0 Prolene suture and placed external drain bag. Patient tolerated the procedure well. COMPLICATIONS: COMPLICATIONS none IMPRESSION: 1. Technically successful percutaneous cholecystostomy tube  placement with ultrasound and fluoroscopic guidance. Electronically Signed   By: Lucrezia Europe M.D.   On: 07/17/2016 17:15      No results found for: HGBA1C Lab Results  Component Value Date   CREATININE 3.39 (H) 07/18/2016       Scheduled Meds: . darbepoetin (ARANESP) injection - DIALYSIS  200 mcg Intravenous Q Sat-HD  . famotidine (PEPCID) IV  20 mg Intravenous Q24H  . feeding supplement (ENSURE ENLIVE)  237 mL Oral BID BM  . levofloxacin  750 mg Oral Once   Followed by  . [START ON 07/20/2016] levofloxacin  500 mg Oral Q48H  . midodrine  5 mg Oral TID WC   Continuous Infusions:     LOS: 5 days    Time spent: >30 MINS    [facility]  [facility] Pager [phone]. If 7PM-7AM, please contact night-coverage at [email or web address], password [email or web address] 07/18/2016, 11:34 AM  LOS: 5 days

## 2016-07-18 NOTE — Progress Notes (Signed)
Subjective:  Feeling tired  Since  IR  Percutaneous Cholecystostomy  Tube placed / abd pain improved / appetite   down   But No N/V or SOB  / has voided 700 cc yesterday/250cc today   So far.   Objective Vital signs in last 24 hours: Vitals:   07/17/16 2334 07/18/16 0142 07/18/16 0451 07/18/16 1000  BP: 125/65 (!) 145/65 127/66 136/64  Pulse: (!) 101 (!) 101 95 95  Resp:   14 18  Temp: (!) 100.4 F (38 C) 99.4 F (37.4 C) 99.7 F (37.6 C) 99.5 F (37.5 C)  TempSrc: Oral Oral Oral Oral  SpO2: 93% 96% 97% 98%  Weight:      Height:    _0  (1.854 m)   Weight change:   Physical Exam: General:  Thin elderly WM OX3 alert, NAD Heart: RRR, no rub Lungs:  CTA  Bilat / non labored breathing  Abdomen: soft, non tender/ Non distended/ Right Upper Quad Drain brownish  liquid fluid collection   Extremities:  Trace pitting dependent edema/ No asterixis  Dialysis Access: R IJ  PC and left AVF pos bruit ( placed 6/14)   Dialysis:  TTS SW  4h  73kg   3K/ 2.25 bath  Hep 7300   R IJ cath/ maturing L BCF Aranesp 40 g weekly     pth 133 no vit d   Problem/Plan: 1.Abdominal pain/nausea/vomiting: cholecystitis-sp  IR  perc chole drainage 8/07 / CCS  Recommended IR drain instead of Surgery 2  Blood Cx:1/2 klebsiella pneumoniae.  On Levoquin  3.End-stage renal disease: recent diagnosis, renal bx showed myeloma cast neph w diffuse ATN and diffuse mild interstitial scarring.   Making more urine lately and it is possible he could be recovering some function.  Last  HD was Saturday 07/15/16 /Will hold off on HD for today and another 1-2 days perhaps, watch creat.   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 CKD-MBD:.  pth was  133 as op and no vit d needed. Last phos  4.0  07/15/16 and no binder needed as op  With all phos less than 4.0 on op labs /  Fu am renal labs  7 HTN/vol- pitting edema and hyponatremia- had Uf with HD sat/   128 to 133  Na  today./ on Midodrine  80m tid  bp stable  Now   DErnest Haber PA-C [facility] [phone]/07/2016,2:03 PM  LOS: 5 days   Pt seen, examined and agree w A/P as above.  RKelly SplinterMD [facility] pager [phone]   cell [phone]/07/2016, 6:54 PM    Labs: Basic Metabolic Panel:  Recent Labs Lab 07/14/16 0300 07/15/16 0222 07/16/16 0744 07/17/16 0504 07/18/16 0531  NA 132* 128* 132* 133* 133*  K 3.8 3.6 3.6 3.6 3.5  CL 99* 98* 97* 99* 100*  CO2 21* 20* _1 GLUCOSE 198* 153* 129* 142* 141*  BUN 35* 51* 24* 35* 45*  CREATININE 4.08* 4.26* 2.48* 3.02* 3.39*  CALCIUM 7.2* 7.3* 7.4* 7.6* 7.6*  PHOS 3.8 4.0  --   --   --    Liver Function Tests:  Recent Labs Lab 07/16/16 0744 07/17/16 0504 07/18/16 0531  AST 38 20 17  ALT 34 25  17  ALKPHOS 298* 295* 255*  BILITOT 3.7* 3.5* 3.1*  PROT 4.8* 4.7* 4.8*  ALBUMIN 1.7* 1.7* 1.7*    Recent Labs Lab 07/13/16 0932  LIPASE 15   No results for input(s): AMMONIA in the last 168 hours. CBC:  Recent Labs Lab 07/13/16 0810  07/15/16 0222 07/16/16 0744 07/17/16 0504 07/17/16 1429 07/18/16 0531  WBC 13.6*  < > 19.1* 15.6* 10.2 8.7 5.7  NEUTROABS 12.6*  --   --  14.8*  --   --  4.5  HGB 8.6*  < > 6.8* 8.2* 8.2* 8.0* 8.1*  HCT 26.6*  < > 19.8* 24.3* 24.6* 24.0* 24.9*  MCV 92.1  < > 88.8 88.0 88.8 88.9 90.9  PLT 86*  < > 38* 27* 19* 43* 55*  < > = values in this interval not displayed. Cardiac Enzymes: No results for input(s): CKTOTAL, CKMB, CKMBINDEX, TROPONINI in the last 168 hours. CBG: No results for input(s): GLUCAP in the last 168 hours.    Medications:   . darbepoetin (ARANESP) injection - DIALYSIS  200 mcg Intravenous Q Sat-HD  . famotidine (PEPCID) IV  20 mg Intravenous Q24H  .  feeding supplement (ENSURE ENLIVE)  237 mL Oral BID BM  . [START ON 07/20/2016] levofloxacin  500 mg Oral Q48H  . midodrine  5 mg Oral TID WC  . protein supplement  1 scoop Oral TID WC

## 2016-07-18 NOTE — Progress Notes (Signed)
Initial Nutrition Assessment  DOCUMENTATION CODES:   Non-severe (moderate) malnutrition in context of chronic illness  INTERVENTION:  Continue Ensure Enlive po BID, each supplement provides 350 kcal and 20 grams of protein.  Encourage adequate PO intake.   NUTRITION DIAGNOSIS:   Malnutrition related to chronic illness as evidenced by moderate depletions of muscle mass, moderate depletion of body fat.  GOAL:   Patient will meet greater than or equal to 90% of their needs  MONITOR:   PO intake, Supplement acceptance, Labs, Weight trends, Skin, I & O's  REASON FOR ASSESSMENT:   Malnutrition Screening Tool    ASSESSMENT:   70 year old Caucasian man with past medical history significant for ESRD secondary to multiple myeloma with metastasis to spine and left femur who has been on dialysis for the past 2 months or so (with possibly some partial renal recovery).Started on new chemo drug last week (7/31) and developed significant diarrhea afterwards. He presented on  8/3 with one-day history of nausea, vomiting and abdominal pain and some preceding diarrhea prior to that. Developed septic shock after days of diarrhea Reports decreased ability to tolerate oral intake. In the emergency room he was noted to be hypotensive and ultrasound suggestive of acalculous cholecystitis-seen by surgery and awaiting HIDA scan. Perc cholecystostomy tube 8/7.  Pt familiar to this RD from previous hospital admission. Meal completion 85% this AM. Pt reports his abdominal pain has improved however has pain at his tube site. Family at bedside. They report pt was eating well PTA with no other difficulties. Pt with no significant weight loss per weight record. Pt currently has Ensure ordered and would like to continue with them. Pt encouraged to eat his food at meals.   Nutrition-Focused physical exam completed. Findings are moderate fat depletion, moderate muscle depletion, and mild edema.   Labs and  medications reviewed.   Diet Order:  Diet regular Room service appropriate? Yes; Fluid consistency: Thin  Skin:  Reviewed, no issues  Last BM:  8/5  Height:   Ht Readings from Last 1 Encounters:  07/13/16 6' 1"  (1.854 m)    Weight:   Wt Readings from Last 1 Encounters:  07/16/16 167 lb (75.8 kg)    Ideal Body Weight:  83.6 kg  BMI:  Body mass index is 22.03 kg/m.  Estimated Nutritional Needs:   Kcal:  2100-2300  Protein:  100-115 grams  Fluid:  Per MD  EDUCATION NEEDS:   No education needs identified at this time  Corrin Parker, MS, RD, LDN Pager # [phone] After hours/ weekend pager # [phone]

## 2016-07-18 NOTE — Progress Notes (Signed)
[facility] Progress Note     Subjective: Patient's wife and daughter at bedside. Perc cholecystostomy tube placed yesterday. Patient reports soreness around drain site, denies any other abdominal pain. Denies N/V. Last BM was yesterday and was loose. Denies melena/hematochezia. Has not been out of bed since perc drain placement, per family the patient was very sleepy after the procedure.   Total drain output: ~170 cc and light brown  Objective: Vital signs in last 24 hours: Temp:  [98 F (36.7 C)-100.4 F (38 C)] 99.7 F (37.6 C) (08/08 0451) Pulse Rate:  [86-101] 95 (08/08 0451) Resp:  [14-22] 14 (08/08 0451) BP: (118-145)/(62-98) 127/66 (08/08 0451) SpO2:  [89 %-97 %] 97 % (08/08 0451) Last BM Date: 07/15/16  Intake/Output from previous day: 08/07 0701 - 08/08 0700 In: 871 [P.O.:270; Blood:601] Out: 821 [Urine:700; Drains:120; Stool:1] Intake/Output this shift: No intake/output data recorded.  PE: Gen:  Alert, NAD, pleasant Card:  RRR, no M/G/R heard Pulm:  CTA, no W/R/R Abd: Soft, mild ttp RUQ/epigastrum, ND, hypoactive BS, perc drain in RUQ  Without surrounding erythema. <5cc in light brown liquid in bag. Ext:  Pedal pulses palpable   Lab Results:  Hepatic Function Latest Ref Rng & Units 07/18/2016 07/17/2016 07/16/2016  Total Protein 6.5 - 8.1 g/dL 4.8(L) 4.7(L) 4.8(L)  Albumin 3.5 - 5.0 g/dL 1.7(L) 1.7(L) 1.7(L)  AST 15 - 41 U/L 17 20 38  ALT 17 - 63 U/L 17 25 34  Alk Phosphatase 38 - 126 U/L 255(H) 295(H) 298(H)  Total Bilirubin 0.3 - 1.2 mg/dL 3.1(H) 3.5(H) 3.7(H)  Bilirubin, Direct 0.1 - 0.5 mg/dL - - -    Recent Labs  07/17/16 1429 07/18/16 0531  WBC 8.7 5.7  HGB 8.0* 8.1*  HCT 24.0* 24.9*  PLT 43* 55*   BMET  Recent Labs  07/17/16 0504 07/18/16 0531  NA 133* 133*  K 3.6 3.5  CL 99* 100*  CO2 26 25  GLUCOSE 142* 141*  BUN 35* 45*  CREATININE 3.02* 3.39*  CALCIUM 7.6* 7.6*   PT/INR  Recent Labs  07/17/16 0932  LABPROT 19.7*   INR 1.65   C Lipase     Component Value Date/Time   LIPASE 15 07/13/2016 0932   Studies/Results: Mr Abdomen Mrcp Wo Cm  Result Date: 07/17/2016 CLINICAL DATA:  Right upper quadrant pain. Elevated liver function tests. History of multiple myeloma. EXAM: MRI ABDOMEN WITHOUT CONTRAST  (INCLUDING MRCP) TECHNIQUE: Multiplanar multisequence MR imaging of the abdomen was performed. Heavily T2-weighted images of the biliary and pancreatic ducts were obtained, and three-dimensional MRCP images were rendered by post processing. COMPARISON:  Ultrasound or nuclear medicine hepatobiliary study of 07/13/2016. FINDINGS: Mild to moderate motion degradation throughout. Lower chest: Left larger than right pleural effusions with bibasilar airspace disease. Normal heart size without pericardial or pleural effusion. Hepatobiliary: No focal liver lesion. No intrahepatic biliary duct dilatation. Gallbladder sludge with mild gallbladder distension. Gallbladder wall thickening including at 10 mm on image 27/series 4. Mild pericholecystic fluid, including on image 25/series 5. No evidence of common duct stone. Pancreas: Mild pancreatic atrophy, without duct dilatation or mass. Limited evaluation for acute pancreatitis, secondary to relatively diffuse abdominal edema. Spleen: Mild splenomegaly, 14.0 cm craniocaudal. Adrenals/Urinary Tract: Normal adrenal glands. Bilateral renal lesions which are likely cysts. No hydronephrosis. Stomach/Bowel: Normal stomach and abdominal small bowel loops. There is wall thickening of and edema within both the ascending and descending colon, including on images 21/series 3 and 44/series 5. Vascular/Lymphatic: Normal aortic caliber.  No retroperitoneal or retrocrural adenopathy. Other: Small volume ascites.  Diffuse anasarca. Musculoskeletal: Heterogeneous marrow signal with is foci of T2 hyperintensity within the lower lumbar and lower thoracic spine. IMPRESSION: 1. Motion degraded exam. 2. No  biliary duct dilatation or choledocholithiasis. 3. Gallbladder distension with wall thickening and pericholecystic fluid. Concurrent gallbladder sludge. Findings are suspicious for acute cholecystitis. 4. Bilateral pleural effusions and ascites, suggesting fluid overload. 5. Heterogeneous marrow signal with foci of T2 hyperintensity within. Likely related to the clinical history of multiple myeloma. 6. Multi focal colitis.  Distribution favors infection. Electronically Signed   By: Abigail Miyamoto M.D.   On: 07/17/2016 07:55   Mr 3d Recon At Scanner  Result Date: 07/17/2016 CLINICAL DATA:  Right upper quadrant pain. Elevated liver function tests. History of multiple myeloma. EXAM: MRI ABDOMEN WITHOUT CONTRAST  (INCLUDING MRCP) TECHNIQUE: Multiplanar multisequence MR imaging of the abdomen was performed. Heavily T2-weighted images of the biliary and pancreatic ducts were obtained, and three-dimensional MRCP images were rendered by post processing. COMPARISON:  Ultrasound or nuclear medicine hepatobiliary study of 07/13/2016. FINDINGS: Mild to moderate motion degradation throughout. Lower chest: Left larger than right pleural effusions with bibasilar airspace disease. Normal heart size without pericardial or pleural effusion. Hepatobiliary: No focal liver lesion. No intrahepatic biliary duct dilatation. Gallbladder sludge with mild gallbladder distension. Gallbladder wall thickening including at 10 mm on image 27/series 4. Mild pericholecystic fluid, including on image 25/series 5. No evidence of common duct stone. Pancreas: Mild pancreatic atrophy, without duct dilatation or mass. Limited evaluation for acute pancreatitis, secondary to relatively diffuse abdominal edema. Spleen: Mild splenomegaly, 14.0 cm craniocaudal. Adrenals/Urinary Tract: Normal adrenal glands. Bilateral renal lesions which are likely cysts. No hydronephrosis. Stomach/Bowel: Normal stomach and abdominal small bowel loops. There is wall thickening  of and edema within both the ascending and descending colon, including on images 21/series 3 and 44/series 5. Vascular/Lymphatic: Normal aortic caliber. No retroperitoneal or retrocrural adenopathy. Other: Small volume ascites.  Diffuse anasarca. Musculoskeletal: Heterogeneous marrow signal with is foci of T2 hyperintensity within the lower lumbar and lower thoracic spine. IMPRESSION: 1. Motion degraded exam. 2. No biliary duct dilatation or choledocholithiasis. 3. Gallbladder distension with wall thickening and pericholecystic fluid. Concurrent gallbladder sludge. Findings are suspicious for acute cholecystitis. 4. Bilateral pleural effusions and ascites, suggesting fluid overload. 5. Heterogeneous marrow signal with foci of T2 hyperintensity within. Likely related to the clinical history of multiple myeloma. 6. Multi focal colitis.  Distribution favors infection. Electronically Signed   By: Abigail Miyamoto M.D.   On: 07/17/2016 07:55   Ir Perc Cholecystostomy  Result Date: 07/17/2016 CLINICAL DATA:  Ultrasound, MRCP, and hepatobiliary scintigraphy suggest acute cholecystitis. Thrombocytopenia. EXAM: PERCUTANEOUS CHOLECYSTOSTOMY TUBE PLACEMENT WITH ULTRASOUND AND FLUOROSCOPIC GUIDANCE FLUOROSCOPY TIME:  36 seconds, 6 mGy TECHNIQUE: The procedure, risks (including but not limited to bleeding, infection, organ damage ), benefits, and alternatives were explained to the patient. Questions regarding the procedure were encouraged and answered. The patient understands and consents to the procedure. Survey ultrasound of the abdomen was performed and an appropriate skin entry site was identified. Skin site was marked, prepped with Betadine, and draped in usual sterile fashion, and infiltrated locally with 1% lidocaine.Intravenous Fentanyl and Versed were administered as conscious sedation during continuous monitoring of the patient's level of consciousness and physiological / cardiorespiratory status by AV the radiology RN,  with a total moderate sedation time of 10 minutes. Under real-time ultrasound guidance, gallbladder was accessed using a transhepatic approach with a 21-gauge needle.  Ultrasound image documentation was saved. Bile returned through the hub. Needle was exchanged over a 018 guidewire for transitional dilator which allowed placement of 035 J wire. Over this, a 10.2 French pigtail catheter was advanced and formed centrally in the gallbladder lumen. Small contrast injection confirmed appropriate position. Catheter secured externally with 0 Prolene suture and placed external drain bag. Patient tolerated the procedure well. COMPLICATIONS: COMPLICATIONS none IMPRESSION: 1. Technically successful percutaneous cholecystostomy tube placement with ultrasound and fluoroscopic guidance. Electronically Signed   By: Lucrezia Europe M.D.   On: 07/17/2016 17:15    Anti-infectives: Anti-infectives    Start     Dose/Rate Route Frequency Ordered Stop   07/17/16 1800  vancomycin (VANCOCIN) IVPB 1000 mg/200 mL premix     1,000 mg 200 mL/hr over 60 Minutes Intravenous To Radiology 07/17/16 1701 07/18/16 1800   07/15/16 1800  aztreonam (AZACTAM) 1 g in dextrose 5 % 50 mL IVPB     1 g 100 mL/hr over 30 Minutes Intravenous Every 24 hours 07/14/16 1232     07/15/16 1200  vancomycin (VANCOCIN) IVPB 750 mg/150 ml premix  Status:  Discontinued     750 mg 150 mL/hr over 60 Minutes Intravenous Every T-Th-Sa (Hemodialysis) 07/14/16 1232 07/15/16 1445   07/14/16 1900  vancomycin (VANCOCIN) IVPB 750 mg/150 ml premix  Status:  Discontinued     750 mg 150 mL/hr over 60 Minutes Intravenous Every 24 hours 07/13/16 1740 07/14/16 1232   07/14/16 1800  fluconazole (DIFLUCAN) IVPB 200 mg  Status:  Discontinued     200 mg 100 mL/hr over 60 Minutes Intravenous Every 24 hours 07/13/16 1701 07/14/16 0949   07/14/16 1500  levofloxacin (LEVAQUIN) IVPB 500 mg  Status:  Discontinued     500 mg 100 mL/hr over 60 Minutes Intravenous Every 48 hours  07/13/16 1731 07/14/16 0949   07/13/16 2200  metroNIDAZOLE (FLAGYL) IVPB 500 mg  Status:  Discontinued     500 mg 100 mL/hr over 60 Minutes Intravenous Every 8 hours 07/13/16 1732 07/17/16 1409   07/13/16 1745  aztreonam (AZACTAM) 1 g in dextrose 5 % 50 mL IVPB  Status:  Discontinued     1 g 100 mL/hr over 30 Minutes Intravenous Every 8 hours 07/13/16 1731 07/14/16 1232   07/13/16 1730  fluconazole (DIFLUCAN) IVPB 400 mg     400 mg 100 mL/hr over 120 Minutes Intravenous  Once 07/13/16 1701 07/13/16 2251   07/13/16 1700  vancomycin (VANCOCIN) 1,500 mg in sodium chloride 0.9 % 500 mL IVPB     1,500 mg 250 mL/hr over 120 Minutes Intravenous  Once 07/13/16 1651 07/13/16 1951   07/13/16 1445  ciprofloxacin (CIPRO) IVPB 400 mg     400 mg 200 mL/hr over 60 Minutes Intravenous  Once 07/13/16 1434 07/13/16 1555   07/13/16 1445  metroNIDAZOLE (FLAGYL) IVPB 500 mg     500 mg 100 mL/hr over 60 Minutes Intravenous  Once 07/13/16 1434 07/13/16 1721       Assessment/Plan Principal Problem:   Abdominal pain Active Problems:   Multiple myeloma (HCC) s/p chemo/radiation   ESRD on dialysis ([place])   Thrombocytopenia ([place]) pharmacy reviewed pt medications - Acyclovir can cause TTP   Anemia of renal disease   N&V (nausea and vomiting)   Sepsis (HCC)   Acute respiratory failure with hypoxia (HCC)   Elevated LFTs   Acalculous cholecystitis Colitis S/p Gastrostomy tube placement 07/17/16 Dr. Arne Cleveland  IV abx Continue working with therapies Will monitor drain output  and follow LFTs/WBC, along with abdominal exams.  ID: cipro 8/3 once, vanc 8/3 once. Flagyl 8/3-8/7, aztreonam 8/3>> DVT Proph: SCD's Code status: Full Code   LOS: 5 days    Jill Alexanders , [facility] 07/18/2016, 9:50 AM Pager: [phone] Consults: [phone] Mon-Fri 7:00 am-4:30 pm Sat-Sun 7:00 am-11:30 am

## 2016-07-18 NOTE — Evaluation (Signed)
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 He presented on  8/3 with one-day history of nausea, vomiting and abdominal pain Pt did have percutaneous cholecystomy drain placed 8/7.    Clinical Impression Pt admitted with above diagnosis. Pt currently with functional limitations due to the deficits listed below (see PT Problem List). Pt was agitated and lethargic upon PT arrival today and gave limited participation. Was not willing to move from supine to EOB so limited bed exercises were completed; limited muscle activity in L leg. Strongly encourage pt the importance of moving while in acute setting to prevent further decreases in strength. PT will see Mr. Thomas [name] tomorrow with the goal of standing but must continue encouragement for patient participation. Pt will benefit from skilled PT to increase their independence and safety with mobility to allow discharge to the venue listed below.  In order to d/c home, pt must be able to transfer min guard/supervision especially transferring in and out of car.                 Follow Up Recommendations Other (comment) (Post-Acute Rehab; Dependent on patients functional capacity; pt family likely to decline SNF)    Equipment Recommendations       Recommendations for Other Services       Precautions / Restrictions Precautions Precaution Comments: drain R side Restrictions Weight Bearing Restrictions: No      Mobility  Bed Mobility               General bed mobility comments: refused to move EOB; wanted to  continue resting due to pain;  Transfers                 General transfer comment: Pt was agitated today and was persistent on not wanting to move from laying in bed  Ambulation/Gait                Stairs            Wheelchair Mobility    Modified Rankin (Stroke Patients Only)       Balance                                             Pertinent Vitals/Pain Pain Assessment: Faces Pain Descriptors / Indicators: Discomfort    Home Living Family/patient expects to be discharged to:: Private residence Living Arrangements: Spouse/significant other Available Help at Discharge: Family;Available 24 hours/day Type of Home: House Home Access: Ramped entrance     Home Layout: One level Home Equipment: Bedside commode;Shower seat      Prior Function Level of Independence: Independent with assistive device(s)               Hand Dominance   Dominant Hand: Right    Extremity/Trunk Assessment   Upper Extremity Assessment: Generalized weakness                     Communication   Communication: No difficulties  Cognition Arousal/Alertness:  Lethargic Behavior During Therapy: Agitated Overall Cognitive Status: Impaired/Different from baseline (family says this is not his 'normal self")                      General Comments      Exercises General Exercises - Lower Extremity Ankle Circles/Pumps: AROM;Strengthening;Both;10 reps;Supine Quad Sets: AROM;Both;5 reps;Supine      Assessment/Plan    PT Assessment Patient needs continued PT services  PT Diagnosis Generalized weakness   PT Problem List Decreased strength;Decreased range of motion;Decreased balance;Decreased activity tolerance;Decreased mobility  PT Treatment Interventions     PT Goals (Current goals can be found in the Care Plan section) Acute Rehab PT Goals Patient Stated Goal: did not state    Frequency     Barriers to discharge         Co-evaluation               End of Session   Activity Tolerance: Patient limited by fatigue;Patient limited by lethargy;Treatment limited secondary to agitation Patient left: in bed;with call bell/phone within reach;with family/visitor present           Time: 2561-5488 PT Time Calculation (min) (ACUTE ONLY): 25 min   Charges:   PT Evaluation $PT Eval Moderate Complexity: 1 Procedure PT Treatments $Therapeutic Activity: 8-22 mins   PT G Codes:       Crissie Sickles, SPT Acute Rehabilitation Services   Crissie Sickles 07/18/2016, 4:36 PM

## 2016-07-18 NOTE — Progress Notes (Signed)
Pharmacy Antibiotic Note Thomas [name] is a 70 y.o. male with multiple myeloma admitted on 07/13/2016 with sepsis w/ gallbladder as suspected source. . Diagnosed with  acute cholecystitis.  S/p  percutaneous cholecystostomy drain on 07/17/16.  Metronidazole DC'd 8/7. /3 Blood Cx:1/2 klebsiella pneumoniae, sensitive to Imipenem (pan sensitive except resistant to ampicillin and unasyn, ESBL negative).  WBC remains wnl 5.7, Afebrile. Tm 100.4 Today Dr. Allyson Sabal ordered to discontinue IV Aztreonam and change to oral antibiotic per pharmacy recommends. Patient allergies reviewed.  I discussed this with ID pharmacist- recommended oral levofloxacin for klebsiella bacteremia.    Patient has ESRD on HD Tues, Thurs, Sat.-  Last HD done Sat 8/5.  Nephrologist noted yesterday 8/7 that patient voided 400 cc then another 300 cc overnight according  to pt and his wife.  Scr up from 2.48 to 3.02. Dr. Jonnie Finner noted on 8/7 his plan to hold off on HD on 8/7 and possibly another 1-2 days.    Plan: Discontinue IV azactam  per Dr. Allyson Sabal and change to po abx-levaquin  Levofloxacin 75m po x1 then 500 mg po q48h -monitor clinical progress, cultures, for renal function improvement.   Weight: 167 lb (75.8 kg)  Temp (24hrs), Avg:98.8 F (37.1 C), Min:98 F (36.7 C), Max:100.4 F (38 C)   Recent Labs Lab 07/13/16 0946 07/13/16 1500 07/13/16 1850 07/13/16 2240  07/14/16 0300 07/15/16 0222 07/16/16 0744 07/17/16 0504 07/17/16 1429 07/18/16 0531  WBC  --   --   --   --   < > 48.7* 19.1* 15.6* 10.2 8.7 5.7  CREATININE  --   --   --   --   --  4.08* 4.26* 2.48* 3.02*  --  3.39*  LATICACIDVEN 4.74* 2.75* 3.4* 3.2*  --   --   --   --   --   --   --   < > = values in this interval not displayed.  Estimated Creatinine Clearance: 22.4 mL/min (by C-G formula based on SCr of 3.39 mg/dL).    Allergies  Allergen Reactions  . Penicillins Rash and Other (See Comments)    Has patient had a PCN reaction causing immediate  rash, facial/tongue/throat swelling, SOB or lightheadedness with hypotension: YES + Reaction causing SEVERE RASH involving MUCUS MEMBRANES or SKIN NECROSIS >> YES Reaction that required hospitalization: NO Reaction occurring within the last 10 years: NO If all of the above answers are "NO", then may proceed with Cephalosporin use.   . Dilaudid [Hydromorphone Hcl] Other (See Comments)    hallucinations  . Fish Allergy Nausea And Vomiting  . Heparin     HIT panel/SRA pending  . Codeine Other (See Comments)    "MAKES ME JUMPY"    Antimicrobials this admission: 8/3 Azactam >>8/8 8/3 Flagyl >>8/7 8/3 Vancomycin >>8/5 8/3 Fluconazole >> 8/3 8/4 Levaquin >> 8/3 8/3 Cipro x 1>>8/3  Dose adjustments this admission: Aztreonam doses reduced to reflect ESRD, HD schedule (to give dose after HD on HD days).   Microbiology results: 8/3 BCx:1/2 kleb pna:  PAN sensitive except Resistant to Amp, Unasyn;  ESBL negative 8/3 MRSA PCR: neg  Thank you for allowing pharmacy to be a part of this patient's care.  RNicole Cella RPh Clinical Pharmacist Pager: [phone]/07/2016 11:27 AM

## 2016-07-18 NOTE — Care Management Note (Signed)
Case Management Note  Patient Details  Name: Arlan Obier MRN: [id number] Date of Birth: [birth year]  Subjective/Objective:        CM following for progression and d/c planning.            Action/Plan: 07/18/2016 Spoke with pt wife and daughter on 07/17/16 re pt progression since last hospitalization and HH needs upon d/c for this hospitalization. Pt has been active with [facility] and they wish to continue care with [facility].  Orders for [facility] noted and [facility] notified.  No DME needs as pt has DME in place.   Expected Discharge Date:    07/19/2016              Expected Discharge Plan:  [place]  In-House Referral:  NA  Discharge planning Services  CM Consult  Post Acute Care Choice:  Home Health Choice offered to:  Spouse  DME Arranged:  N/A DME Agency:  NA  HH Arranged:  RN, PT, OT, Nurse's Aide [place] Agency:  [facility]  Status of Service:  Completed, signed off  If discussed at [place] of Stay Meetings, dates discussed:    Additional Comments:  Adron Bene, RN 07/18/2016, 12:22 PM

## 2016-07-18 NOTE — Evaluation (Signed)
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 He presented on  8/3 with one-day history of nausea, vomiting and abdominal pain Pt did have percutaneous cholecystomy drain placed 8/7.     Clinical Impression   Pt admitted with abdominal pain. Pt currently with functional limitations due to the deficits listed below (see OT Problem List). Pt will benefit from skilled OT to increase their safety and independence with ADL and functional mobility for ADL to facilitate discharge to venue listed below.      Follow Up Recommendations  Home health OT;Supervision/Assistance - 24 hour    Equipment Recommendations  None recommended by OT    Recommendations for Other Services       Precautions / Restrictions Precautions Precaution Comments: drain R side      Mobility Bed Mobility               General bed mobility comments: initially agreed to sit EOB but then refused stating he was weak and just not gonna do it.   Transfers                 General transfer comment: not observed         ADL Overall ADL's : Needs assistance/impaired Eating/Feeding: Minimal assistance                                     General ADL Comments: Much of sesson spend focusing on importance of moving, Pts cough sounds very wet . Encouraged pt to use incentive spirometer and pt barely did it and said he couldnt do it again.  Pt just stated he didnt feel like it.  Explained importance of this as wel l as sitting up. Limited eval.                Pertinent Vitals/Pain Pain Assessment: No/denies pain (pt states no pain but then said tube site was sore)        Extremity/Trunk Assessment Upper Extremity Assessment Upper Extremity Assessment: Generalized weakness           Communication Communication Communication: No difficulties   Cognition Arousal/Alertness: Lethargic Behavior During Therapy: Agitated Overall Cognitive Status: Impaired/Different from baseline (family states his demeanor today is not his normal self.  )                                Home Living Family/patient expects to be discharged to:: Private residence Living Arrangements: Spouse/significant other Available Help at Discharge: Family;Available 24 hours/day Type of Home: House Home Access: Stairs to enter   Entrance Stairs-Rails: Right       Bathroom Shower/Tub: Walk-in shower             Additional Comments: wife and daughter expressed wantto take pt home as he was doing well      Prior Functioning/Environment Level of Independence: Independent with assistive device(s)             OT Diagnosis: Generalized  weakness   OT Problem List: Decreased strength;Decreased activity tolerance   OT Treatment/Interventions: Self-care/ADL training;Patient/family education    OT Goals(Current goals can be found in the care plan section) Acute Rehab OT Goals Patient Stated Goal: did not state Time For Goal Achievement: 08/01/16 ADL Goals Pt Will Perform Grooming: with set-up;sitting Pt Will Transfer to Toilet: with min assist;bedside commode Pt Will Perform Toileting - Clothing Manipulation and hygiene: with min assist;sit to/from stand  OT Frequency: Min 2X/week   Barriers to D/C:               End of Session Nurse Communication: Other (comment) (how OT session went)  Activity Tolerance: Patient limited by fatigue;Treatment limited secondary to agitation Patient left: in bed;with call bell/phone within reach;with  family/visitor present   Time: 1034-1050 OT Time Calculation (min): 16 min Charges:  OT General Charges $OT Visit: 1 Procedure OT Evaluation $OT Eval Moderate Complexity: 1 Procedure G-Codes:    Payton Mccallum D 2016/07/27, 12:20 PM

## 2016-07-18 NOTE — Progress Notes (Signed)
Thomas [name]   DOB:October 09, 1946   PZ#:[id number]    Subjective: The patient is seen in his room along with his wife. The patient has been quite sedated since his procedure yesterday. He is arousable intermittently. He denies pain Objective:  Vitals:   07/18/16 0142 07/18/16 0451  BP: (!) 145/65 127/66  Pulse: (!) 101 95  Resp:  14  Temp: 99.4 F (37.4 C) 99.7 F (37.6 C)     Intake/Output Summary (Last 24 hours) at 07/18/16 2423 Last data filed at 07/18/16 0600  Gross per 24 hour  Intake              871 ml  Output              821 ml  Net               50 ml    GENERAL:He appears sleepy but comfortable SKIN: skin color is pale, texture, turgor are normal, no rashes or significant lesions EYES: normal, Conjunctiva are pale and non-injected, sclera clear OROPHARYNX:no exudate, no erythema and lips, buccal mucosa, and tongue normal  NECK: supple, thyroid normal size, non-tender, without nodularity LYMPH:  no palpable lymphadenopathy in the cervical, axillary or inguinal LUNGS: clear to auscultation and percussion with normal breathing effort HEART: regular rate & rhythm and no murmurs and no lower extremity edema ABDOMEN:abdomen soft, non-tender and normal bowel sounds. Noted biliary drain Musculoskeletal:no cyanosis of digits and no clubbing  NEURO:sleepy but rousable   Labs:  Lab Results  Component Value Date   WBC 5.7 07/18/2016   HGB 8.1 (L) 07/18/2016   HCT 24.9 (L) 07/18/2016   MCV 90.9 07/18/2016   PLT 55 (L) 07/18/2016   NEUTROABS 4.5 07/18/2016    Lab Results  Component Value Date   NA 133 (L) 07/18/2016   K 3.5 07/18/2016   CL 100 (L) 07/18/2016   CO2 25 07/18/2016    Studies:  Mr Abdomen Mrcp Wo Cm  Result Date: 07/17/2016 CLINICAL DATA:  Right upper quadrant pain. Elevated liver function tests. History of multiple myeloma. EXAM: MRI ABDOMEN WITHOUT CONTRAST  (INCLUDING MRCP) TECHNIQUE: Multiplanar multisequence MR imaging of the abdomen was performed.  Heavily T2-weighted images of the biliary and pancreatic ducts were obtained, and three-dimensional MRCP images were rendered by post processing. COMPARISON:  Ultrasound or nuclear medicine hepatobiliary study of 07/13/2016. FINDINGS: Mild to moderate motion degradation throughout. Lower chest: Left larger than right pleural effusions with bibasilar airspace disease. Normal heart size without pericardial or pleural effusion. Hepatobiliary: No focal liver lesion. No intrahepatic biliary duct dilatation. Gallbladder sludge with mild gallbladder distension. Gallbladder wall thickening including at 10 mm on image 27/series 4. Mild pericholecystic fluid, including on image 25/series 5. No evidence of common duct stone. Pancreas: Mild pancreatic atrophy, without duct dilatation or mass. Limited evaluation for acute pancreatitis, secondary to relatively diffuse abdominal edema. Spleen: Mild splenomegaly, 14.0 cm craniocaudal. Adrenals/Urinary Tract: Normal adrenal glands. Bilateral renal lesions which are likely cysts. No hydronephrosis. Stomach/Bowel: Normal stomach and abdominal small bowel loops. There is wall thickening of and edema within both the ascending and descending colon, including on images 21/series 3 and 44/series 5. Vascular/Lymphatic: Normal aortic caliber. No retroperitoneal or retrocrural adenopathy. Other: Small volume ascites.  Diffuse anasarca. Musculoskeletal: Heterogeneous marrow signal with is foci of T2 hyperintensity within the lower lumbar and lower thoracic spine. IMPRESSION: 1. Motion degraded exam. 2. No biliary duct dilatation or choledocholithiasis. 3. Gallbladder distension with wall thickening and pericholecystic fluid. Concurrent  gallbladder sludge. Findings are suspicious for acute cholecystitis. 4. Bilateral pleural effusions and ascites, suggesting fluid overload. 5. Heterogeneous marrow signal with foci of T2 hyperintensity within. Likely related to the clinical history of multiple  myeloma. 6. Multi focal colitis.  Distribution favors infection. Electronically Signed   By: Abigail Miyamoto M.D.   On: 07/17/2016 07:55   Mr 3d Recon At Scanner  Result Date: 07/17/2016 CLINICAL DATA:  Right upper quadrant pain. Elevated liver function tests. History of multiple myeloma. EXAM: MRI ABDOMEN WITHOUT CONTRAST  (INCLUDING MRCP) TECHNIQUE: Multiplanar multisequence MR imaging of the abdomen was performed. Heavily T2-weighted images of the biliary and pancreatic ducts were obtained, and three-dimensional MRCP images were rendered by post processing. COMPARISON:  Ultrasound or nuclear medicine hepatobiliary study of 07/13/2016. FINDINGS: Mild to moderate motion degradation throughout. Lower chest: Left larger than right pleural effusions with bibasilar airspace disease. Normal heart size without pericardial or pleural effusion. Hepatobiliary: No focal liver lesion. No intrahepatic biliary duct dilatation. Gallbladder sludge with mild gallbladder distension. Gallbladder wall thickening including at 10 mm on image 27/series 4. Mild pericholecystic fluid, including on image 25/series 5. No evidence of common duct stone. Pancreas: Mild pancreatic atrophy, without duct dilatation or mass. Limited evaluation for acute pancreatitis, secondary to relatively diffuse abdominal edema. Spleen: Mild splenomegaly, 14.0 cm craniocaudal. Adrenals/Urinary Tract: Normal adrenal glands. Bilateral renal lesions which are likely cysts. No hydronephrosis. Stomach/Bowel: Normal stomach and abdominal small bowel loops. There is wall thickening of and edema within both the ascending and descending colon, including on images 21/series 3 and 44/series 5. Vascular/Lymphatic: Normal aortic caliber. No retroperitoneal or retrocrural adenopathy. Other: Small volume ascites.  Diffuse anasarca. Musculoskeletal: Heterogeneous marrow signal with is foci of T2 hyperintensity within the lower lumbar and lower thoracic spine. IMPRESSION: 1.  Motion degraded exam. 2. No biliary duct dilatation or choledocholithiasis. 3. Gallbladder distension with wall thickening and pericholecystic fluid. Concurrent gallbladder sludge. Findings are suspicious for acute cholecystitis. 4. Bilateral pleural effusions and ascites, suggesting fluid overload. 5. Heterogeneous marrow signal with foci of T2 hyperintensity within. Likely related to the clinical history of multiple myeloma. 6. Multi focal colitis.  Distribution favors infection. Electronically Signed   By: Abigail Miyamoto M.D.   On: 07/17/2016 07:55   Ir Perc Cholecystostomy  Result Date: 07/17/2016 CLINICAL DATA:  Ultrasound, MRCP, and hepatobiliary scintigraphy suggest acute cholecystitis. Thrombocytopenia. EXAM: PERCUTANEOUS CHOLECYSTOSTOMY TUBE PLACEMENT WITH ULTRASOUND AND FLUOROSCOPIC GUIDANCE FLUOROSCOPY TIME:  36 seconds, 6 mGy TECHNIQUE: The procedure, risks (including but not limited to bleeding, infection, organ damage ), benefits, and alternatives were explained to the patient. Questions regarding the procedure were encouraged and answered. The patient understands and consents to the procedure. Survey ultrasound of the abdomen was performed and an appropriate skin entry site was identified. Skin site was marked, prepped with Betadine, and draped in usual sterile fashion, and infiltrated locally with 1% lidocaine.Intravenous Fentanyl and Versed were administered as conscious sedation during continuous monitoring of the patient's level of consciousness and physiological / cardiorespiratory status by AV the radiology RN, with a total moderate sedation time of 10 minutes. Under real-time ultrasound guidance, gallbladder was accessed using a transhepatic approach with a 21-gauge needle. Ultrasound image documentation was saved. Bile returned through the hub. Needle was exchanged over a 018 guidewire for transitional dilator which allowed placement of 035 J wire. Over this, a 10.2 French pigtail catheter  was advanced and formed centrally in the gallbladder lumen. Small contrast injection confirmed appropriate position. Catheter secured externally  with 0 Prolene suture and placed external drain bag. Patient tolerated the procedure well. COMPLICATIONS: COMPLICATIONS none IMPRESSION: 1. Technically successful percutaneous cholecystostomy tube placement with ultrasound and fluoroscopic guidance. Electronically Signed   By: Lucrezia Europe M.D.   On: 07/17/2016 17:15    Assessment & Plan:   Multiple myeloma not having achieved remission ([place]) I review his recent test results with him and family. Serum light chain has dropped significantly to near normal ratio Due to recent pancytopenia, treatment is placed on hold Continue aggressive supportive care Of note, his recent chemotherapy is not the cause of the acute cholecystitis  Antineoplastic chemotherapy induced pancytopenia (HCC) Pancytopenia has improved since discontinuation of Revlimid. He is becoming more pancytopenic again especially with thrombocytopenia likely due to side effects of antibiotic therapy. The patient denies any recent signs or symptoms of bleeding such as spontaneous epistaxis, hematuria or hematochezia. The anemia is related to chronic renal failure. He is getting ESA through dialysis center I will defer to primary service to decide regarding transfusion threshold He does not need platelet transfusion unless is less than 10 or signs of bleeding. If invasive procedure is needed, I recommend platelet transfusion before and after surgery  N&V (nausea and vomiting), resolved He had received IV fluids and antiemetics  Abdominal pain, resolved Leukocytosis with Elevated LFT He is currently on broad-spectrum IV antibiotics MRCP suggested possible acute cholecystitis He has IR placement of drainage to on 46/60/5637 without complications. Cultures are pending  End-stage renal failure on dialysis Follows with nephrologist to  determine the need for hemodialysis  Severe protein calorie malnutrition Hopefully K can start some nutritional intake soon  Coagulopathy He received some fresh frozen plasma yesterday Likely due to synthetic liver dysfunction and recent elevated liver enzymes Monitor closely for signs of bleeding and oral vitamin K once he is able to tolerate oral intake  Discharge planning He is too ill to be discharged anytime soon I will return tomorrow to check on the patient  [facility], [place], MD 07/18/2016  8:21 AM

## 2016-07-18 NOTE — Progress Notes (Deleted)
    Postoperative Access Visit   History of Present Illness  Errol Kresge is a 70 y.o. year old male who presents for postoperative follow-up for: R 1st BRVT (Date: 05/24/16).  The patient's wounds are *** healed.  The patient notes *** steal symptoms.  The patient is *** able to complete their activities of daily living.  The patient's current symptoms are: ***.  For VQI Use Only  PRE-ADM LIVING: {VQI Pre-admission Living:20973}  AMB STATUS: {VQI Ambulatory Status:20974}  Physical Examination There were no vitals filed for this visit.  RUE: Incision is *** healed, skin feels ***, hand grip is ***/5, sensation in digits is *** intact, ***palpable thrill, bruit can *** be auscultated   Medical Decision Making  Keshaun Derienzo is a 70 y.o. year old male who presents s/p R 1st BRVT.   The patient's access is *** ready for use.  The patient's tunneled dialysis catheter can be removed after two successful cannulations and completed dialysis treatments.  Thank you for allowing [place] to participate in this patient's care.  Adele Barthel, MD, FACS [facility] Office: [phone] Pager: [phone]

## 2016-07-18 NOTE — Progress Notes (Signed)
Referring Physician(s): Dr Reyne Dumas  Supervising Physician: Marybelle Killings  Patient Status:  Inpatient  Chief Complaint:  Percutaneous cholecystostomy drain placed 8/7 in IR   Subjective:  Very weak today Denies N/V Drain intact   Allergies: Penicillins; Dilaudid [hydromorphone hcl]; Fish allergy; Heparin; and Codeine  Medications: Prior to Admission medications   Medication Sig Start Date End Date Taking? Authorizing Provider  acetaminophen (TYLENOL) 325 MG tablet Take 2 tablets (650 mg total) by mouth every 6 (six) hours as needed for mild pain (or Fever >/= 101). 05/29/16  Yes Nishant Dhungel, MD  acyclovir (ZOVIRAX) 200 MG capsule Take 1 capsule (200 mg total) by mouth 2 (two) times daily. 07/03/16  Yes Heath Lark, MD  ALPRAZolam (XANAX) 0.25 MG tablet Take 1 tablet (0.25 mg total) by mouth 2 (two) times daily as needed for anxiety. 06/08/16  Yes Daniel J Angiulli, PA-C  calcium acetate (PHOSLO) 667 MG capsule Take 1 capsule (667 mg total) by mouth 2 (two) times daily with a meal. 06/08/16  Yes Daniel J Angiulli, PA-C  cyanocobalamin 1000 MCG tablet Take 1 tablet (1,000 mcg total) by mouth daily. 06/08/16  Yes Daniel J Angiulli, PA-C  dexamethasone (DECADRON) 4 MG tablet Take 5 tablets (20 mg total) by mouth once a week. 07/03/16  Yes Heath Lark, MD  diphenoxylate-atropine (LOMOTIL) 2.5-0.025 MG tablet Take 1 tablet by mouth 4 (four) times daily as needed for diarrhea or loose stools. 07/07/16  Yes Heath Lark, MD  lenalidomide (REVLIMID) 2.5 MG capsule Take 1 capsule daily for 14 days, then off 7 days 06/12/16  Yes Heath Lark, MD  loperamide (IMODIUM A-D) 2 MG tablet Take 2 mg by mouth 4 (four) times daily as needed for diarrhea or loose stools.   Yes Historical Provider, MD  loratadine (CLARITIN) 10 MG tablet Take 10 mg by mouth daily as needed for allergies.    Yes Historical Provider, MD  multivitamin (RENA-VIT) TABS tablet Take 1 tablet by mouth at bedtime. 06/08/16  Yes  Daniel J Angiulli, PA-C  ondansetron (ZOFRAN) 8 MG tablet Take 1 tablet (8 mg total) by mouth every 8 (eight) hours as needed for nausea. 07/03/16  Yes Heath Lark, MD  pantoprazole (PROTONIX) 40 MG tablet Take 1 tablet (40 mg total) by mouth daily. 06/08/16  Yes Daniel J Angiulli, PA-C  tobramycin (TOBREX) 0.3 % ophthalmic solution Place 1 drop into both eyes every 4 (four) hours. 07/03/16  Yes Heath Lark, MD  triamcinolone (NASACORT AQ) 55 MCG/ACT AERO nasal inhaler Place 2 sprays into the nose daily. 07/03/16  Yes Heath Lark, MD  Vitamin D, Ergocalciferol, (DRISDOL) 50000 units CAPS capsule Take 1 capsule (50,000 Units total) by mouth every 7 (seven) days. 06/08/16  Yes Daniel J Angiulli, PA-C     Vital Signs: BP 136/64 (BP Location: Right Arm)   Pulse 95   Temp 99.5 F (37.5 C) (Oral)   Resp 18   Wt 167 lb (75.8 kg)   SpO2 98%   BMI 22.03 kg/m   Physical Exam  Constitutional: He is oriented to person, place, and time.  Abdominal: Soft. Bowel sounds are normal. There is no tenderness.  Neurological: He is alert and oriented to person, place, and time.  Skin: Skin is warm and dry.  Site of drain is clean and dry NT No bleeding Output 120 cc yesterday 20 cc in bag Brownish bile  Cx pending  Nursing note and vitals reviewed.   Imaging: Mr Abdomen Mrcp Wo Cm  Result Date: 07/17/2016 CLINICAL DATA:  Right upper quadrant pain. Elevated liver function tests. History of multiple myeloma. EXAM: MRI ABDOMEN WITHOUT CONTRAST  (INCLUDING MRCP) TECHNIQUE: Multiplanar multisequence MR imaging of the abdomen was performed. Heavily T2-weighted images of the biliary and pancreatic ducts were obtained, and three-dimensional MRCP images were rendered by post processing. COMPARISON:  Ultrasound or nuclear medicine hepatobiliary study of 07/13/2016. FINDINGS: Mild to moderate motion degradation throughout. Lower chest: Left larger than right pleural effusions with bibasilar airspace disease. Normal  heart size without pericardial or pleural effusion. Hepatobiliary: No focal liver lesion. No intrahepatic biliary duct dilatation. Gallbladder sludge with mild gallbladder distension. Gallbladder wall thickening including at 10 mm on image 27/series 4. Mild pericholecystic fluid, including on image 25/series 5. No evidence of common duct stone. Pancreas: Mild pancreatic atrophy, without duct dilatation or mass. Limited evaluation for acute pancreatitis, secondary to relatively diffuse abdominal edema. Spleen: Mild splenomegaly, 14.0 cm craniocaudal. Adrenals/Urinary Tract: Normal adrenal glands. Bilateral renal lesions which are likely cysts. No hydronephrosis. Stomach/Bowel: Normal stomach and abdominal small bowel loops. There is wall thickening of and edema within both the ascending and descending colon, including on images 21/series 3 and 44/series 5. Vascular/Lymphatic: Normal aortic caliber. No retroperitoneal or retrocrural adenopathy. Other: Small volume ascites.  Diffuse anasarca. Musculoskeletal: Heterogeneous marrow signal with is foci of T2 hyperintensity within the lower lumbar and lower thoracic spine. IMPRESSION: 1. Motion degraded exam. 2. No biliary duct dilatation or choledocholithiasis. 3. Gallbladder distension with wall thickening and pericholecystic fluid. Concurrent gallbladder sludge. Findings are suspicious for acute cholecystitis. 4. Bilateral pleural effusions and ascites, suggesting fluid overload. 5. Heterogeneous marrow signal with foci of T2 hyperintensity within. Likely related to the clinical history of multiple myeloma. 6. Multi focal colitis.  Distribution favors infection. Electronically Signed   By: Abigail Miyamoto M.D.   On: 07/17/2016 07:55   Mr 3d Recon At Scanner  Result Date: 07/17/2016 CLINICAL DATA:  Right upper quadrant pain. Elevated liver function tests. History of multiple myeloma. EXAM: MRI ABDOMEN WITHOUT CONTRAST  (INCLUDING MRCP) TECHNIQUE: Multiplanar  multisequence MR imaging of the abdomen was performed. Heavily T2-weighted images of the biliary and pancreatic ducts were obtained, and three-dimensional MRCP images were rendered by post processing. COMPARISON:  Ultrasound or nuclear medicine hepatobiliary study of 07/13/2016. FINDINGS: Mild to moderate motion degradation throughout. Lower chest: Left larger than right pleural effusions with bibasilar airspace disease. Normal heart size without pericardial or pleural effusion. Hepatobiliary: No focal liver lesion. No intrahepatic biliary duct dilatation. Gallbladder sludge with mild gallbladder distension. Gallbladder wall thickening including at 10 mm on image 27/series 4. Mild pericholecystic fluid, including on image 25/series 5. No evidence of common duct stone. Pancreas: Mild pancreatic atrophy, without duct dilatation or mass. Limited evaluation for acute pancreatitis, secondary to relatively diffuse abdominal edema. Spleen: Mild splenomegaly, 14.0 cm craniocaudal. Adrenals/Urinary Tract: Normal adrenal glands. Bilateral renal lesions which are likely cysts. No hydronephrosis. Stomach/Bowel: Normal stomach and abdominal small bowel loops. There is wall thickening of and edema within both the ascending and descending colon, including on images 21/series 3 and 44/series 5. Vascular/Lymphatic: Normal aortic caliber. No retroperitoneal or retrocrural adenopathy. Other: Small volume ascites.  Diffuse anasarca. Musculoskeletal: Heterogeneous marrow signal with is foci of T2 hyperintensity within the lower lumbar and lower thoracic spine. IMPRESSION: 1. Motion degraded exam. 2. No biliary duct dilatation or choledocholithiasis. 3. Gallbladder distension with wall thickening and pericholecystic fluid. Concurrent gallbladder sludge. Findings are suspicious for acute cholecystitis. 4. Bilateral pleural effusions  and ascites, suggesting fluid overload. 5. Heterogeneous marrow signal with foci of T2 hyperintensity  within. Likely related to the clinical history of multiple myeloma. 6. Multi focal colitis.  Distribution favors infection. Electronically Signed   By: Abigail Miyamoto M.D.   On: 07/17/2016 07:55   Ir Perc Cholecystostomy  Result Date: 07/17/2016 CLINICAL DATA:  Ultrasound, MRCP, and hepatobiliary scintigraphy suggest acute cholecystitis. Thrombocytopenia. EXAM: PERCUTANEOUS CHOLECYSTOSTOMY TUBE PLACEMENT WITH ULTRASOUND AND FLUOROSCOPIC GUIDANCE FLUOROSCOPY TIME:  36 seconds, 6 mGy TECHNIQUE: The procedure, risks (including but not limited to bleeding, infection, organ damage ), benefits, and alternatives were explained to the patient. Questions regarding the procedure were encouraged and answered. The patient understands and consents to the procedure. Survey ultrasound of the abdomen was performed and an appropriate skin entry site was identified. Skin site was marked, prepped with Betadine, and draped in usual sterile fashion, and infiltrated locally with 1% lidocaine.Intravenous Fentanyl and Versed were administered as conscious sedation during continuous monitoring of the patient's level of consciousness and physiological / cardiorespiratory status by AV the radiology RN, with a total moderate sedation time of 10 minutes. Under real-time ultrasound guidance, gallbladder was accessed using a transhepatic approach with a 21-gauge needle. Ultrasound image documentation was saved. Bile returned through the hub. Needle was exchanged over a 018 guidewire for transitional dilator which allowed placement of 035 J wire. Over this, a 10.2 French pigtail catheter was advanced and formed centrally in the gallbladder lumen. Small contrast injection confirmed appropriate position. Catheter secured externally with 0 Prolene suture and placed external drain bag. Patient tolerated the procedure well. COMPLICATIONS: COMPLICATIONS none IMPRESSION: 1. Technically successful percutaneous cholecystostomy tube placement with  ultrasound and fluoroscopic guidance. Electronically Signed   By: Lucrezia Europe M.D.   On: 07/17/2016 17:15    Labs:  CBC:  Recent Labs  07/16/16 0744 07/17/16 0504 07/17/16 1429 07/18/16 0531  WBC 15.6* 10.2 8.7 5.7  HGB 8.2* 8.2* 8.0* 8.1*  HCT 24.3* 24.6* 24.0* 24.9*  PLT 27* 19* 43* 55*    COAGS:  Recent Labs  05/10/16 1156 05/11/16 0500 07/17/16 0932  INR 1.40 1.46 1.65  APTT 33 31 31    BMP:  Recent Labs  07/15/16 0222 07/16/16 0744 07/17/16 0504 07/18/16 0531  NA 128* 132* 133* 133*  K 3.6 3.6 3.6 3.5  CL 98* 97* 99* 100*  CO2 20* 26 26 25   GLUCOSE 153* 129* 142* 141*  BUN 51* 24* 35* 45*  CALCIUM 7.3* 7.4* 7.6* 7.6*  CREATININE 4.26* 2.48* 3.02* 3.39*  GFRNONAA 13* 25* 20* 17*  GFRAA 15* 29* 23* 20*    LIVER FUNCTION TESTS:  Recent Labs  07/15/16 0840 07/16/16 0744 07/17/16 0504 07/18/16 0531  BILITOT 2.9* 3.7* 3.5* 3.1*  AST 30 38 20 17  ALT 38 34 25 17  ALKPHOS 187* 298* 295* 255*  PROT 4.7* 4.8* 4.7* 4.8*  ALBUMIN 1.8* 1.7* 1.7* 1.7*    Assessment and Plan:  GB drain intact Will follow HH RN will need to flush drain 1 x/day with 5-10 cc sterile saline Drain to remain 6-8 weeks Plan to see IR OP [facility] - unless goes to OR Pt will hear from [facility] for appt time and date  Electronically Signed: Tameaka Eichhorn A 07/18/2016, 10:54 AM   I spent a total of 15 Minutes at the the patient's bedside AND on the patient's hospital floor or unit, greater than 50% of which was counseling/coordinating care for Perc chole drain

## 2016-07-19 ENCOUNTER — Other Ambulatory Visit: Payer: Self-pay | Admitting: *Deleted

## 2016-07-19 LAB — CBC WITH DIFFERENTIAL/PLATELET
Basophils Absolute: 0 10*3/uL (ref 0.0–0.1)
Basophils Relative: 1 %
EOS PCT: 0 %
Eosinophils Absolute: 0 10*3/uL (ref 0.0–0.7)
HCT: 25.5 % — ABNORMAL LOW (ref 39.0–52.0)
HEMOGLOBIN: 8.3 g/dL — AB (ref 13.0–17.0)
LYMPHS PCT: 9 %
Lymphs Abs: 0.4 10*3/uL — ABNORMAL LOW (ref 0.7–4.0)
MCH: 29.7 pg (ref 26.0–34.0)
MCHC: 32.5 g/dL (ref 30.0–36.0)
MCV: 91.4 fL (ref 78.0–100.0)
MONO ABS: 0.7 10*3/uL (ref 0.1–1.0)
MONOS PCT: 14 %
NEUTROS PCT: 76 %
Neutro Abs: 3.7 10*3/uL (ref 1.7–7.7)
PLATELETS: 52 10*3/uL — AB (ref 150–400)
RBC: 2.79 MIL/uL — AB (ref 4.22–5.81)
RDW: 15.9 % — ABNORMAL HIGH (ref 11.5–15.5)
WBC: 4.8 10*3/uL (ref 4.0–10.5)

## 2016-07-19 LAB — COMPREHENSIVE METABOLIC PANEL
ALK PHOS: 206 U/L — AB (ref 38–126)
ALT: 15 U/L — AB (ref 17–63)
AST: 17 U/L (ref 15–41)
Albumin: 1.7 g/dL — ABNORMAL LOW (ref 3.5–5.0)
Anion gap: 9 (ref 5–15)
BUN: 52 mg/dL — AB (ref 6–20)
CALCIUM: 7.7 mg/dL — AB (ref 8.9–10.3)
CHLORIDE: 101 mmol/L (ref 101–111)
CO2: 25 mmol/L (ref 22–32)
CREATININE: 3.51 mg/dL — AB (ref 0.61–1.24)
GFR calc Af Amer: 19 mL/min — ABNORMAL LOW (ref >60)
GFR, EST NON AFRICAN AMERICAN: 16 mL/min — AB (ref >60)
Glucose, Bld: 144 mg/dL — ABNORMAL HIGH (ref 65–99)
Potassium: 3.8 mmol/L (ref 3.5–5.1)
Sodium: 135 mmol/L (ref 135–145)
TOTAL PROTEIN: 4.8 g/dL — AB (ref 6.5–8.1)
Total Bilirubin: 2.3 mg/dL — ABNORMAL HIGH (ref 0.3–1.2)

## 2016-07-19 LAB — PHOSPHORUS: PHOSPHORUS: 3.9 mg/dL (ref 2.5–4.6)

## 2016-07-19 MED ORDER — VITAMIN B-12 1000 MCG PO TABS
1000.0000 ug | ORAL_TABLET | Freq: Every day | ORAL | Status: DC
  Administered 2016-07-19 – 2016-08-02 (×15): 1000 ug via ORAL
  Filled 2016-07-19 (×15): qty 1

## 2016-07-19 MED ORDER — VITAMIN D (ERGOCALCIFEROL) 1.25 MG (50000 UNIT) PO CAPS
50000.0000 [IU] | ORAL_CAPSULE | ORAL | Status: DC
  Filled 2016-07-19: qty 1

## 2016-07-19 MED ORDER — FUROSEMIDE 80 MG PO TABS
160.0000 mg | ORAL_TABLET | Freq: Two times a day (BID) | ORAL | Status: DC
  Filled 2016-07-19: qty 2

## 2016-07-19 MED ORDER — VITAMIN D (ERGOCALCIFEROL) 1.25 MG (50000 UNIT) PO CAPS
50000.0000 [IU] | ORAL_CAPSULE | ORAL | Status: DC
  Administered 2016-07-20 – 2016-07-27 (×2): 50000 [IU] via ORAL
  Filled 2016-07-19 (×2): qty 1

## 2016-07-19 MED ORDER — FUROSEMIDE 80 MG PO TABS
160.0000 mg | ORAL_TABLET | Freq: Three times a day (TID) | ORAL | Status: DC
  Administered 2016-07-19 – 2016-07-20 (×3): 160 mg via ORAL
  Filled 2016-07-19 (×3): qty 2

## 2016-07-19 MED ORDER — RENA-VITE PO TABS
1.0000 | ORAL_TABLET | Freq: Every day | ORAL | Status: DC
Start: 2016-07-19 — End: 2016-08-02
  Administered 2016-07-19 – 2016-08-01 (×13): 1 via ORAL
  Filled 2016-07-19 (×15): qty 1

## 2016-07-19 NOTE — Progress Notes (Signed)
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 He presented on  8/3 with one-day history of nausea, vomiting and abdominal pain Pt did have percutaneous cholecystomy drain placed 8/7.      PT Comments    Today's session focused on functional mobility assessment and progression. Mr. Casalino continues to require max encouragement to move. Sat to EOB with mod assist. 10 to 12 min sitting EOB; cue for posture and breathing. Reported difficulty breathing (on room air SpO2 96-93% sitting EOB). Reported dizziness with a SBP drop >20 mmHg with increased time sitting upright. Initiated transfer training, performing lateral scoots towards [place], before returning to supine. Educated and reinforced use on incentive spirometer. Left bed in semi chair position; O2 sats 89-91% on room air at end of session. RN notified.   Continue to recommend post acute rehab; he had a recent successful stay in [facility]. Worth considering CIR as a d/c venue again. Will request rehab admissions coordinator to review his case.      Follow Up Recommendations  Other (comment) (Rehab following acute stay; likely to decline SNF)     Equipment Recommendations  Rolling walker with 5" wheels    Recommendations for Other Services       Precautions / Restrictions Precautions Precaution Comments: drain R side Restrictions Weight Bearing Restrictions: No    Mobility  Bed Mobility Overal bed mobility: Needs Assistance Bed Mobility: Supine to Sit     Supine to sit: Mod assist     General bed mobility  comments: Pt relucant to participate. Good initiation moving legs to EOB and raising trunk; light mod assist to come to fully upright sitting and square off hips at EOB.   Transfers Overall transfer level: Needs assistance Equipment used: 2 person hand held assist Transfers: Lateral/Scoot Transfers          Lateral/Scoot Transfers: Mod assist General transfer comment: At EOB, 3 small lateral scoots towards HOB. Good anterior weight shift over feet in preperation. Noted good recruitment of LE musculature; knees blocked for stability during scoot.   Ambulation/Gait Ambulation/Gait assistance:  (Unable to ambulation due to >20 mmHg drop in SBP. )               Stairs            Wheelchair Mobility    Modified Rankin (Stroke Patients Only)       Balance Overall balance assessment: Needs assistance Sitting-balance support: Bilateral upper extremity supported;Feet supported Sitting balance-Leahy Scale: Fair                              Cognition Arousal/Alertness: Awake/alert Behavior During Therapy: Agitated Overall Cognitive Status: Within Functional Limits for tasks assessed                      Exercises      General Comments General comments (skin integrity, edema, etc.): Pt complains of pain near R biliary tube and difficulty breathing; frequent productive cough. Max encouragement for partcipation. Family present and very willing to assist. Monitored  vitals; orthostatic reaction after a few minutes EOB; drop in SpO2 to 89% on room air. After return to supine, SpO2 increased to 91%; communicated O2 sats with nurse.  See vitals flow sheet.        Pertinent Vitals/Pain Pain Assessment: Faces Faces Pain Scale: Hurts even more Pain Descriptors / Indicators: Discomfort;Grimacing;Sore Pain Intervention(s): Limited activity within patient's tolerance;Monitored during session;Repositioned    Home Living                      Prior  Function            PT Goals (current goals can now be found in the care plan section) Acute Rehab PT Goals Patient Stated Goal: did not state Progress towards PT goals: Progressing toward goals    Frequency  Min 3X/week    PT Plan Current plan remains appropriate    Co-evaluation             End of Session Equipment Utilized During Treatment: Gait belt Activity Tolerance: Patient limited by fatigue;Patient limited by pain;Treatment limited secondary to agitation Patient left: in bed in semi chair position;with call bell/phone within reach;with family/visitor present     Time: 1321-1400 PT Time Calculation (min) (ACUTE ONLY): 39 min  Charges:  $Therapeutic Activity: 38-52 mins                    G Codes:       Jabier Mutton, SPT Acute Rehab Services [phone]   Jabier Mutton 07/19/2016, 2:45 PM

## 2016-07-19 NOTE — Progress Notes (Signed)
Thomas [name]   DOB:01-24-1946   PF#:[id number]    Subjective: Multiple family members are present. He had mild productive sputum. He had recent chest discomfort but he has nearly resolved. The drain is not bothering him too much. Denies fever or chills. No nausea. He had mild abdominal bloating with mild diarrhea  Objective:  Vitals:   07/19/16 1340 07/19/16 1349  BP: (!) 103/58 (!) 146/68  Pulse: (!) 119 (!) 102  Resp:    Temp:       Intake/Output Summary (Last 24 hours) at 07/19/16 1714 Last data filed at 07/19/16 1520  Gross per 24 hour  Intake              530 ml  Output              115 ml  Net              415 ml    GENERAL:alert, no distress and comfortable SKIN: skin color is pale and less jaundiced, texture, turgor are normal, no rashes or significant lesions EYES: normal, Conjunctiva are jaundiced Musculoskeletal:no cyanosis of digits and no clubbing  NEURO: alert & oriented x 3 with fluent speech, no focal motor/sensory deficits   Labs:  Lab Results  Component Value Date   WBC 4.8 07/19/2016   HGB 8.3 (L) 07/19/2016   HCT 25.5 (L) 07/19/2016   MCV 91.4 07/19/2016   PLT 52 (L) 07/19/2016   NEUTROABS 3.7 07/19/2016    Lab Results  Component Value Date   NA 135 07/19/2016   K 3.8 07/19/2016   CL 101 07/19/2016   CO2 25 07/19/2016    Assessment & Plan:   Multiple myeloma not having achieved remission ([place]) I review his recent test results with him and family. Serum light chain has dropped significantly to near normal ratio Due to recent pancytopenia, treatment is placed on hold Continue aggressive supportive care Of note, his recent chemotherapy is not the cause of the acute cholecystitis Will resume Rx next week in the clinic  Antineoplastic chemotherapy induced pancytopenia ([place]) Pancytopenia has improved since discontinuation of Revlimid. He is becoming more pancytopenic again especially with thrombocytopenia likely due to side effects of antibiotic  therapy. The patient denies any recent signs or symptoms of bleeding such as spontaneous epistaxis, hematuria or hematochezia. The anemia is related to chronic renal failure. He is getting ESA through dialysis center I will defer to primary service to decide regarding transfusion threshold He does not need platelet transfusion unless is less than 10 or signs of bleeding. If invasive procedure is needed, I recommend platelet transfusion before and after surgery  N&V (nausea and vomiting), resolved He had received IV fluids and antiemetics  Abdominal pain, resolved Leukocytosis with Elevated LFT, improving He is currently on broad-spectrum IV antibiotics MRCP suggested possible acute cholecystitis He has IR placement of drainage to on 53/29/9242 without complications. Cultures are pending  End-stage renal failure on dialysis Follows with nephrologist to determine the need for hemodialysis  Severe protein calorie malnutrition Encourage oral supplements  Recent Coagulopathy He received some fresh frozen plasma recently Likely due to synthetic liver dysfunction and recent elevated liver enzymes Monitor closely for signs of bleeding and consider oral vitamin K once he is able to tolerate oral intake  Discharge planning Overall, he is improving rapidly. Hopefully, he can be discharged by the end of the week. I will sign off. He has appointment to follow-up in the clinic next week on 07/17/2016  [facility],  Shahab Polhamus, MD 07/19/2016  5:14 PM

## 2016-07-19 NOTE — Progress Notes (Signed)
Patient ID: Thomas [name], male   DOB: 09/12/1946, 70 y.o.   MRN: [id number]   Percutaneous cholecystostomy drain was flushed easily with 10 cc sterile saline RN in room ; given instructions  Family in room and instructed as well with good understanding  Site is clean and dry No bleeding No sign of infection Output is bile  Redressed without complication  Pt to see IR OP Drain [facility] 6 weeks He will hear from IR scheduler with time and date

## 2016-07-19 NOTE — Progress Notes (Signed)
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 He presented on  8/3 with one-day history of nausea, vomiting and abdominal pain and some preceding diarrhea prior to that. Developed septic shock after days of diarrhea Reports decreased ability to tolerate oral intake. In the emergency room he was noted to be hypotensive and ultrasound suggestive of acalculous cholecystitis-seen by surgery and awaiting HIDA scan. He remained hypotensive upon transfer to the ICU and was started on pressors.   Assessment and plan  Hypotension > resolved, off all vasopressors  Gram-negative sepsis - -Patient evaluated by general surgery, liver function is improving, patient may have possible CBD stone. Presentation concerning for acalculous cholecystitis  , surgery recommends percutaneous drain ,Percutaneous cholecystostomy drain placed 8/7 in IR  Continue Midodrine 5 mg TID, blood pressure stable vancomycin discontinued 8/5, also discontinued Flagyl and aztreonam, patient now switched to levofloxacin for a total of 3 weeks for Klebsiella pneumonia bacteremia  MRI/MRCP showed findings c/w cholecystitis, no stones, no CBD stones along with asc and des colitis   Thrombocytopenia-in the setting of  multiple myeloma, received 2 units of packed red blood cell transfusions prior to IR procedure Serum light chain has dropped significantly to near normal ratio Due to recent pancytopenia, treatment is placed on hold Pancytopenia has improved since discontinuation of Revlimid.   anemia is related to chronic renal failure. He is getting ESA through dialysis center Discussed with Heath Lark, MD , no further platelet transfusions indicated at this time, platelets 52   Transaminitis - improving, passed biliary stone?Percutaneous cholecystostomy drain placed 8/7 in IR Appreciate surgery, IR, GI, HH RN will need to flush drain 1 x/day with 5-10 cc sterile saline Drain to remain 6-8 weeks Plan to see IR OP [facility] - unless goes to OR Blood cultures are positive for  Klebsiella pneumonia ,sepsis is resolving.    End-stage renal disease: Secondary to multiple myeloma and getting hemodialysis via right IJ tunneled dialysis catheter- last done on Tuesday. .    Continue with bladder scans, in and out cath when needed - refusing in and out caths- "they are not giving me enough time to go " attempted to explain the rationale of not letting liters of urine stay in bladder to no avail. Nephrology following and Dr. Jonnie Finner to decide if patient is going to need hemodialysis As perNephrology Will hold off on HD for today and follow creatinine    Multiple myeloma-On chemotherapy/radiation to left femur-further management per hematology   DVT prophylaxsis SCDs  Code Status:  Full code    Family Communication: Discussed in detail with the patient, wife, daughter, all imaging results, lab results explained to the patient   Disposition Plan:   Anticipate discharge in one to 2 days with  home health, pending renal clearance     Consultants:  Critical care  General surgery  Nephrology   STUDIES:  abd u/s 8/3>>> Distended gallbladder with mild gallbladder wall thickening and gallbladder sludge.  Could not exclude acalculous cholecystitis.  Normal caliber common bile duct.  Small amount of right upper quadrant ascites and right pleural effusion.  CULTURES: BC x 2 8/3>>>   ANTIBIOTICS: Flagyl 8/3>>> vanc 8/3>>> 8/5 Diflucan 8/3>>>8/4 Levaquin 8/3>>>8/4 Aztreonam 8/3>>>     LINES/TUBES: R IJ Perm cath 6/1>>>       HPI/Subjective: Patient awake but very tired and family by the bedside and encourage them to mobilize the patient  Objective: Vitals:   07/18/16 1000 07/18/16 1724 07/18/16 2127 07/19/16 0608  BP: 136/64 126/62 127/62 137/71  Pulse: 95 97 87 96  Resp: _0 Temp: 99.5 F (37.5 C) 98.6 F (37 C) 99.3 F (37.4 C) 98.5 F (36.9 C)  TempSrc: Oral Oral Oral Oral  SpO2: 98% 98% 93% 92%  Weight:   78.9 kg (174 lb)   Height: _1  (1.854 m)  _2  (1.854 m)     Intake/Output Summary (Last 24 hours) at 07/19/16 0847 Last data filed at 07/19/16 0500  Gross per 24 hour  Intake              460 ml  Output              280 ml  Net              180 ml    Exam:  Examination:  General exam: Appears calm and comfortable  Respiratory system: Clear to auscultation. Respiratory effort normal. Cardiovascular system: S1 & S2 heard, RRR. No JVD, murmurs, rubs, gallops or clicks. No pedal edema. Gastrointestinal system: Abdomen is nondistended, soft and nontender. No organomegaly or masses felt. Normal bowel sounds heard. Central nervous system: Alert and oriented. No focal neurological deficits. Extremities: Symmetric 5 x 5 power. Skin: No rashes, lesions or ulcers Psychiatry: Judgement and insight appear normal. Mood & affect appropriate.     Data Reviewed: I have personally reviewed following labs and imaging studies  Micro Results Recent Results (from the past 240 hour(s))  Culture, blood (routine x 2)     Status: None   Collection Time: 07/13/16  4:20 PM  Result Value Ref Range Status   Specimen Description BLOOD RIGHT WRIST  Final    Special Requests BOTTLES DRAWN AEROBIC AND ANAEROBIC 5CC  Final   Culture NO GROWTH 5 DAYS  Final   Report Status 07/18/2016 FINAL  Final  Culture, blood (routine x 2)     Status: Abnormal   Collection Time: 07/13/16  5:04 PM  Result Value Ref Range Status   Specimen Description BLOOD RIGHT HAND BAER  Final   Special Requests 3CC  Final   Culture  Setup Time   Final    GRAM NEGATIVE RODS AEROBIC BOTTLE ONLY CRITICAL RESULT CALLED TO, READ BACK BY AND VERIFIED WITH: M MACCIA,PHARMD AT 1006 07/15/16 BY L BENFIELD    Culture KLEBSIELLA PNEUMONIAE (A)  Final   Report Status 07/17/2016 FINAL  Final   Organism ID, Bacteria KLEBSIELLA PNEUMONIAE  Final      Susceptibility   Klebsiella pneumoniae - MIC*    AMPICILLIN >=32 RESISTANT Resistant     CEFAZOLIN <=4 SENSITIVE Sensitive     CEFEPIME <=1 SENSITIVE Sensitive     CEFTAZIDIME <=1 SENSITIVE Sensitive     CEFTRIAXONE <=1  SENSITIVE Sensitive     CIPROFLOXACIN 1 SENSITIVE Sensitive     GENTAMICIN <=1 SENSITIVE Sensitive     IMIPENEM <=0.25 SENSITIVE Sensitive     TRIMETH/SULFA 40 SENSITIVE Sensitive     AMPICILLIN/SULBACTAM >=32 RESISTANT Resistant     PIP/TAZO 16 SENSITIVE Sensitive     Extended ESBL NEGATIVE Sensitive     * KLEBSIELLA PNEUMONIAE  Blood Culture ID Panel (Reflexed)     Status: Abnormal   Collection Time: 07/13/16  5:04 PM  Result Value Ref Range Status   Enterococcus species NOT DETECTED NOT DETECTED Final   Vancomycin resistance NOT DETECTED NOT DETECTED Final   Listeria monocytogenes NOT DETECTED NOT DETECTED Final   Staphylococcus species NOT DETECTED NOT DETECTED Final   Staphylococcus aureus NOT DETECTED NOT DETECTED Final   Methicillin resistance NOT DETECTED NOT DETECTED Final   Streptococcus species NOT DETECTED NOT DETECTED Final   Streptococcus agalactiae NOT DETECTED NOT DETECTED Final   Streptococcus pneumoniae NOT DETECTED NOT DETECTED Final   Streptococcus pyogenes NOT DETECTED NOT DETECTED Final    Acinetobacter baumannii NOT DETECTED NOT DETECTED Final   Enterobacteriaceae species DETECTED (A) NOT DETECTED Final    Comment: CRITICAL RESULT CALLED TO, READ BACK BY AND VERIFIED WITH: M MACCIA,PHARMD AT 1006 07/15/16 BY L BENFIELD    Enterobacter cloacae complex NOT DETECTED NOT DETECTED Final   Escherichia coli NOT DETECTED NOT DETECTED Final   Klebsiella oxytoca NOT DETECTED NOT DETECTED Final   Klebsiella pneumoniae DETECTED (A) NOT DETECTED Final    Comment: CRITICAL RESULT CALLED TO, READ BACK BY AND VERIFIED WITH: M MACCIA,PHARMD AT 1006 07/15/16 BY L BENFIELD    Proteus species NOT DETECTED NOT DETECTED Final   Serratia marcescens NOT DETECTED NOT DETECTED Final   Carbapenem resistance NOT DETECTED NOT DETECTED Final   Haemophilus influenzae NOT DETECTED NOT DETECTED Final   Neisseria meningitidis NOT DETECTED NOT DETECTED Final   Pseudomonas aeruginosa NOT DETECTED NOT DETECTED Final   Candida albicans NOT DETECTED NOT DETECTED Final   Candida glabrata NOT DETECTED NOT DETECTED Final   Candida krusei NOT DETECTED NOT DETECTED Final   Candida parapsilosis NOT DETECTED NOT DETECTED Final   Candida tropicalis NOT DETECTED NOT DETECTED Final  MRSA PCR Screening     Status: None   Collection Time: 07/13/16  6:31 PM  Result Value Ref Range Status   MRSA by PCR NEGATIVE NEGATIVE Final    Comment:        The GeneXpert MRSA Assay (FDA approved for NASAL specimens only), is one component of a comprehensive MRSA colonization surveillance program. It is not intended to diagnose MRSA infection nor to guide or monitor treatment for MRSA infections.   Aerobic/Anaerobic Culture (surgical/deep wound)     Status: None (Preliminary result)   Collection Time: 07/17/16  5:18 PM  Result Value Ref Range Status   Specimen Description DRAINAGE BILE  Final   Special Requests Normal  Final   Gram Stain   Final    RARE WBC PRESENT, PREDOMINANTLY MONONUCLEAR RARE GRAM NEGATIVE RODS     Culture CULTURE REINCUBATED FOR BETTER GROWTH  Final   Report Status PENDING  Incomplete    Radiology Reports Nm Hepatobiliary Liver Func  Result Date: 07/14/2016 CLINICAL DATA:  Right upper quadrant pain. Abnormal sonographic appearance of the gallbladder. EXAM: NUCLEAR MEDICINE HEPATOBILIARY IMAGING TECHNIQUE: Sequential images of the abdomen were obtained out to 120 minutes following intravenous administration of radiopharmaceutical. After IV administered of 3 mg morphine, imaging obtained  for an additional 30 minutes. RADIOPHARMACEUTICALS:  5.2  mCi Tc-56m Choletec IV COMPARISON:  Right upper quadrant ultrasound yesterday. FINDINGS: Imaging obtained for 2 hours and 30 minutes. There is mildly delayed hepatic uptake. There is no visualized tracer accumulation within the intrahepatic ducts. At 35 minutes tracer is visualized in the midline, which may be duodenum or common bile duct. There is midline tracer increases in intensity with to and fro trace accumulation proximal and distally likely refluxed into the stomach and passing distally proximal small bowel. After 120 minutes, no gallbladder activity. As small bowel activity was visualized, 3 mg of morphine was administered. Increased GI tract activity, no gallbladder visualization after morphine. IMPRESSION: Gallbladder not visualized after 2.5 hours as well as the administration of morphine. In addition however the intra and extrahepatic bile ducts are not well seen. Nonvisualization of the gallbladder may be secondary to cholecystitis versus underlying hepatocellular dysfunction (patient bilirubin greater than 3). Limited visualization of the biliary tree activity is likely secondary to hepatic cellular dysfunction. Biliary obstruction is felt much less likely as GI tract activity is observed. Electronically Signed   By: MJeb LeveringM.D.   On: 07/14/2016 02:59   Mr Abdomen Mrcp Wo Cm  Result Date: 07/17/2016 CLINICAL DATA:  Right upper quadrant  pain. Elevated liver function tests. History of multiple myeloma. EXAM: MRI ABDOMEN WITHOUT CONTRAST  (INCLUDING MRCP) TECHNIQUE: Multiplanar multisequence MR imaging of the abdomen was performed. Heavily T2-weighted images of the biliary and pancreatic ducts were obtained, and three-dimensional MRCP images were rendered by post processing. COMPARISON:  Ultrasound or nuclear medicine hepatobiliary study of 07/13/2016. FINDINGS: Mild to moderate motion degradation throughout. Lower chest: Left larger than right pleural effusions with bibasilar airspace disease. Normal heart size without pericardial or pleural effusion. Hepatobiliary: No focal liver lesion. No intrahepatic biliary duct dilatation. Gallbladder sludge with mild gallbladder distension. Gallbladder wall thickening including at 10 mm on image 27/series 4. Mild pericholecystic fluid, including on image 25/series 5. No evidence of common duct stone. Pancreas: Mild pancreatic atrophy, without duct dilatation or mass. Limited evaluation for acute pancreatitis, secondary to relatively diffuse abdominal edema. Spleen: Mild splenomegaly, 14.0 cm craniocaudal. Adrenals/Urinary Tract: Normal adrenal glands. Bilateral renal lesions which are likely cysts. No hydronephrosis. Stomach/Bowel: Normal stomach and abdominal small bowel loops. There is wall thickening of and edema within both the ascending and descending colon, including on images 21/series 3 and 44/series 5. Vascular/Lymphatic: Normal aortic caliber. No retroperitoneal or retrocrural adenopathy. Other: Small volume ascites.  Diffuse anasarca. Musculoskeletal: Heterogeneous marrow signal with is foci of T2 hyperintensity within the lower lumbar and lower thoracic spine. IMPRESSION: 1. Motion degraded exam. 2. No biliary duct dilatation or choledocholithiasis. 3. Gallbladder distension with wall thickening and pericholecystic fluid. Concurrent gallbladder sludge. Findings are suspicious for acute  cholecystitis. 4. Bilateral pleural effusions and ascites, suggesting fluid overload. 5. Heterogeneous marrow signal with foci of T2 hyperintensity within. Likely related to the clinical history of multiple myeloma. 6. Multi focal colitis.  Distribution favors infection. Electronically Signed   By: KAbigail MiyamotoM.D.   On: 07/17/2016 07:55   Mr 3d Recon At Scanner  Result Date: 07/17/2016 CLINICAL DATA:  Right upper quadrant pain. Elevated liver function tests. History of multiple myeloma. EXAM: MRI ABDOMEN WITHOUT CONTRAST  (INCLUDING MRCP) TECHNIQUE: Multiplanar multisequence MR imaging of the abdomen was performed. Heavily T2-weighted images of the biliary and pancreatic ducts were obtained, and three-dimensional MRCP images were rendered by post processing. COMPARISON:  Ultrasound or nuclear  medicine hepatobiliary study of 07/13/2016. FINDINGS: Mild to moderate motion degradation throughout. Lower chest: Left larger than right pleural effusions with bibasilar airspace disease. Normal heart size without pericardial or pleural effusion. Hepatobiliary: No focal liver lesion. No intrahepatic biliary duct dilatation. Gallbladder sludge with mild gallbladder distension. Gallbladder wall thickening including at 10 mm on image 27/series 4. Mild pericholecystic fluid, including on image 25/series 5. No evidence of common duct stone. Pancreas: Mild pancreatic atrophy, without duct dilatation or mass. Limited evaluation for acute pancreatitis, secondary to relatively diffuse abdominal edema. Spleen: Mild splenomegaly, 14.0 cm craniocaudal. Adrenals/Urinary Tract: Normal adrenal glands. Bilateral renal lesions which are likely cysts. No hydronephrosis. Stomach/Bowel: Normal stomach and abdominal small bowel loops. There is wall thickening of and edema within both the ascending and descending colon, including on images 21/series 3 and 44/series 5. Vascular/Lymphatic: Normal aortic caliber. No retroperitoneal or retrocrural  adenopathy. Other: Small volume ascites.  Diffuse anasarca. Musculoskeletal: Heterogeneous marrow signal with is foci of T2 hyperintensity within the lower lumbar and lower thoracic spine. IMPRESSION: 1. Motion degraded exam. 2. No biliary duct dilatation or choledocholithiasis. 3. Gallbladder distension with wall thickening and pericholecystic fluid. Concurrent gallbladder sludge. Findings are suspicious for acute cholecystitis. 4. Bilateral pleural effusions and ascites, suggesting fluid overload. 5. Heterogeneous marrow signal with foci of T2 hyperintensity within. Likely related to the clinical history of multiple myeloma. 6. Multi focal colitis.  Distribution favors infection. Electronically Signed   By: Abigail Miyamoto M.D.   On: 07/17/2016 07:55   Ir Perc Cholecystostomy  Result Date: 07/17/2016 CLINICAL DATA:  Ultrasound, MRCP, and hepatobiliary scintigraphy suggest acute cholecystitis. Thrombocytopenia. EXAM: PERCUTANEOUS CHOLECYSTOSTOMY TUBE PLACEMENT WITH ULTRASOUND AND FLUOROSCOPIC GUIDANCE FLUOROSCOPY TIME:  36 seconds, 6 mGy TECHNIQUE: The procedure, risks (including but not limited to bleeding, infection, organ damage ), benefits, and alternatives were explained to the patient. Questions regarding the procedure were encouraged and answered. The patient understands and consents to the procedure. Survey ultrasound of the abdomen was performed and an appropriate skin entry site was identified. Skin site was marked, prepped with Betadine, and draped in usual sterile fashion, and infiltrated locally with 1% lidocaine.Intravenous Fentanyl and Versed were administered as conscious sedation during continuous monitoring of the patient's level of consciousness and physiological / cardiorespiratory status by AV the radiology RN, with a total moderate sedation time of 10 minutes. Under real-time ultrasound guidance, gallbladder was accessed using a transhepatic approach with a 21-gauge needle. Ultrasound image  documentation was saved. Bile returned through the hub. Needle was exchanged over a 018 guidewire for transitional dilator which allowed placement of 035 J wire. Over this, a 10.2 French pigtail catheter was advanced and formed centrally in the gallbladder lumen. Small contrast injection confirmed appropriate position. Catheter secured externally with 0 Prolene suture and placed external drain bag. Patient tolerated the procedure well. COMPLICATIONS: COMPLICATIONS none IMPRESSION: 1. Technically successful percutaneous cholecystostomy tube placement with ultrasound and fluoroscopic guidance. Electronically Signed   By: Lucrezia Europe M.D.   On: 07/17/2016 17:15   Dg Chest Port 1 View  Result Date: 07/13/2016 CLINICAL DATA:  Effusion. Abnormal labs.Hx of CKD, dialysis patient.Ex-smoker, quit ~30 years ago. EXAM: PORTABLE CHEST 1 VIEW COMPARISON:  05/24/2016 FINDINGS: Cardiac silhouette is normal in size. No mediastinal or hilar masses or evidence of adenopathy. There is vascular congestion. Left greater than right pleural effusions are noted. There is some hazy opacity in the lung bases, likely due to atelectasis. No pneumothorax. Right internal jugular dual-lumen central venous catheter is  stable and well positioned. IMPRESSION: 1. Small, left greater than right, pleural effusions with associated lung base opacity, most likely atelectasis. 2. Central vascular congestion without overt pulmonary edema. Electronically Signed   By: Lajean Manes M.D.   On: 07/13/2016 17:27   US Abdomen Limited Ruq  Result Date: 07/13/2016 CLINICAL DATA:  Right upper quadrant pain for 2 days. Nausea and vomiting for 1 day. EXAM: US ABDOMEN LIMITED - RIGHT UPPER QUADRANT COMPARISON:  None. FINDINGS: Gallbladder: The gallbladder is mildly distended measuring 5.2 cm in transverse dimension. This also mild gallbladder wall thickening and gallbladder sludge. No definite gallstones or pericholecystic fluid. Negative sonographic Murphy sign.  Common bile duct: Diameter: 4.1 mm Liver: Normal echogenicity without focal lesion or biliary dilatation. Small amount of right upper quadrant ascites. Right pleural effusion noted. IMPRESSION: 1. Distended gallbladder with mild gallbladder wall thickening and gallbladder sludge. Could not exclude acalculous cholecystitis. 2. Normal caliber common bile duct. 3. Normal liver. 4. Small amount of right upper quadrant ascites and right pleural effusion. Electronically Signed   By: Marijo Sanes M.D.   On: 07/13/2016 12:05     CBC  Recent Labs Lab 07/13/16 0810  07/16/16 0744 07/17/16 0504 07/17/16 1429 07/18/16 0531 07/19/16 0535  WBC 13.6*  < > 15.6* 10.2 8.7 5.7 4.8  HGB 8.6*  < > 8.2* 8.2* 8.0* 8.1* 8.3*  HCT 26.6*  < > 24.3* 24.6* 24.0* 24.9* 25.5*  PLT 86*  < > 27* 19* 43* 55* 52*  MCV 92.1  < > 88.0 88.8 88.9 90.9 91.4  MCH 29.9  < > 29.7 29.6 29.6 29.6 29.7  MCHC 32.5  < > 33.7 33.3 33.3 32.5 32.5  RDW 16.6*  < > 16.2* 16.0* 16.1* 16.1* 15.9*  LYMPHSABS 0.2*  --  0.2*  --   --  0.4* 0.4*  MONOABS 0.7  --  0.6  --   --  0.8 0.7  EOSABS 0.0  --  0.0  --   --  0.0 0.0  BASOSABS 0.0  --  0.0  --   --  0.0 0.0  < > = values in this interval not displayed.  Chemistries   Recent Labs Lab 07/14/16 0300 07/15/16 0222 07/15/16 0840 07/16/16 0744 07/17/16 0504 07/18/16 0531 07/19/16 0535  NA 132* 128*  --  132* 133* 133* 135  K 3.8 3.6  --  3.6 3.6 3.5 3.8  CL 99* 98*  --  97* 99* 100* 101  CO2 21* 20*  --  _0 GLUCOSE 198* 153*  --  129* 142* 141* 144*  BUN 35* 51*  --  24* 35* 45* 52*  CREATININE 4.08* 4.26*  --  2.48* 3.02* 3.39* 3.51*  CALCIUM 7.2* 7.3*  --  7.4* 7.6* 7.6* 7.7*  MG 1.2* 2.1  --   --   --   --   --   AST 44*  --  30 38 _1 ALT 51  --  38 34 25 17 15*  ALKPHOS 226*  --  187* 298* 295* 255* 206*  BILITOT 5.0*  --  2.9* 3.7* 3.5* 3.1* 2.3*    ------------------------------------------------------------------------------------------------------------------ estimated creatinine clearance is 22.5 mL/min (by C-G formula based on SCr of 3.51 mg/dL). ------------------------------------------------------------------------------------------------------------------ No results for input(s): HGBA1C in the last 72 hours. ------------------------------------------------------------------------------------------------------------------ No results for input(s): CHOL, HDL, LDLCALC, TRIG, CHOLHDL, LDLDIRECT in the last 72 hours. ------------------------------------------------------------------------------------------------------------------ No results for input(s): TSH, T4TOTAL, T3FREE, THYROIDAB in the  last 72 hours.  Invalid input(s): FREET3 ------------------------------------------------------------------------------------------------------------------ No results for input(s): VITAMINB12, FOLATE, FERRITIN, TIBC, IRON, RETICCTPCT in the last 72 hours.  Coagulation profile  Recent Labs Lab 07/17/16 0932  INR 1.65    No results for input(s): DDIMER in the last 72 hours.  Cardiac Enzymes No results for input(s): CKMB, TROPONINI, MYOGLOBIN in the last 168 hours.  Invalid input(s): CK ------------------------------------------------------------------------------------------------------------------ Invalid input(s): POCBNP   CBG: No results for input(s): GLUCAP in the last 168 hours.     Studies: Ir Perc Cholecystostomy  Result Date: 07/17/2016 CLINICAL DATA:  Ultrasound, MRCP, and hepatobiliary scintigraphy suggest acute cholecystitis. Thrombocytopenia. EXAM: PERCUTANEOUS CHOLECYSTOSTOMY TUBE PLACEMENT WITH ULTRASOUND AND FLUOROSCOPIC GUIDANCE FLUOROSCOPY TIME:  36 seconds, 6 mGy TECHNIQUE: The procedure, risks (including but not limited to bleeding, infection, organ damage ), benefits, and alternatives were explained to the  patient. Questions regarding the procedure were encouraged and answered. The patient understands and consents to the procedure. Survey ultrasound of the abdomen was performed and an appropriate skin entry site was identified. Skin site was marked, prepped with Betadine, and draped in usual sterile fashion, and infiltrated locally with 1% lidocaine.Intravenous Fentanyl and Versed were administered as conscious sedation during continuous monitoring of the patient's level of consciousness and physiological / cardiorespiratory status by AV the radiology RN, with a total moderate sedation time of 10 minutes. Under real-time ultrasound guidance, gallbladder was accessed using a transhepatic approach with a 21-gauge needle. Ultrasound image documentation was saved. Bile returned through the hub. Needle was exchanged over a 018 guidewire for transitional dilator which allowed placement of 035 J wire. Over this, a 10.2 French pigtail catheter was advanced and formed centrally in the gallbladder lumen. Small contrast injection confirmed appropriate position. Catheter secured externally with 0 Prolene suture and placed external drain bag. Patient tolerated the procedure well. COMPLICATIONS: COMPLICATIONS none IMPRESSION: 1. Technically successful percutaneous cholecystostomy tube placement with ultrasound and fluoroscopic guidance. Electronically Signed   By: Lucrezia Europe M.D.   On: 07/17/2016 17:15      No results found for: HGBA1C Lab Results  Component Value Date   CREATININE 3.51 (H) 07/19/2016       Scheduled Meds: . darbepoetin (ARANESP) injection - DIALYSIS  200 mcg Intravenous Q Sat-HD  . famotidine (PEPCID) IV  20 mg Intravenous Q24H  . feeding supplement (ENSURE ENLIVE)  237 mL Oral BID BM  . [START ON 07/20/2016] levofloxacin  500 mg Oral Q48H  . midodrine  5 mg Oral TID WC  . protein supplement  1 scoop Oral TID WC   Continuous Infusions:     LOS: 6 days    Time spent: >30 MINS     [facility] Pager [phone]. If 7PM-7AM, please contact night-coverage at [email or web address], password [facility] 07/19/2016, 8:47 AM  LOS: 6 days

## 2016-07-19 NOTE — Care Management Important Message (Signed)
Important Message  Patient Details  Name: Thomas [name] MRN: [id number] Date of Birth: 1945-12-25   Medicare Important Message Given:  Yes    Joby Hershkowitz, Leroy Sea 07/19/2016, 3:12 PM

## 2016-07-19 NOTE — Progress Notes (Signed)
Subjective:  Feels better than yest. , eating more this am but still some decr appetite.  No nausea. / UOP 400 this am per wife in rm / has refused foley in/out , no sob ,abd discomfort improving / yest. I/O= 460?280 with  250 uop. Drinking more this am   Objective Vital signs in last 24 hours: Vitals:   07/18/16 1000 07/18/16 1724 07/18/16 2127 07/19/16 0608  BP: 136/64 126/62 127/62 137/71  Pulse: 95 97 87 96  Resp: 18 18 18 17   Temp: 99.5 F (37.5 C) 98.6 F (37 C) 99.3 F (37.4 C) 98.5 F (36.9 C)  TempSrc: Oral Oral Oral Oral  SpO2: 98% 98% 93% 92%  Weight:   78.9 kg (174 lb)   Height: 6' 1"  (1.854 m)  6' 1"  (1.854 m)    Weight change:   Physical Exam: General:  Thin elderly WM OX3  More alert than yest, NAD Heart: RRR, no rub Lungs:  CTA  Bilat / non labored breathing  Abdomen: soft, non tender/ Non distended/ Right Upper Quad Drain brownish  liquid fluid collection decres from yest. Extremities:  Trace pitting dependent edema/ No asterixis  Dialysis Access: R IJ  PC and left AVF pos bruit ( placed 6/14)   Dialysis: TTS SW 4h 73kg 3K/ 2.25 bath Hep 7300 R IJ cath/ maturing L BCF Aranesp 40 g weekly     pth 133 no vit d   Problem/Plan: 1.Abdominal pain/nausea/vomiting: cholecystitis-sp  IR  perc chole drainage 8/07 / CCS  Recommended IR drain instead of Surgery 2  Blood Cx:1/2 klebsiellapneumoniae.  On Levoquin po 3.End-stage renal disease: recent diagnosis, renal bx showed myeloma cast neph w diffuse ATN and diffuse mild interstitial scarring.  Making more urine lately and it is possible he could be recovering some function.  Last  HD was Saturday.  Creat up slightly today, making urine but gaining volume and edema w/o HD.  Will add po lasix.  Not overly optimistic that HD can be avoided but pt is very hopeful so will go another day or two.  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 40 on sat. 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 Alvy Bimler  following  6. CKD-MBD:.  pth was  133 as op and no vit d needed. Stable phos 4.0  07/15/16>3.9 this am  and no binder needed as op  With all phos less than 4.0 on op labs /  7 HTN/vol-  Trace pitting edema and (hyponatremia now resolved)  had Uf with HD sat/   128 to 135  Na  today./ on Midodrine  8m tid  bp stable  Now    DErnest Haber PA-C [id number]/08/2016,9:10 AM  LOS: 6 days   Pt seen, examined, agree w assess/plan as above with additions as indicated.  RKelly SplinterMD [facility] pager [phone]   cell [phone] 77098/08/2016, 10:44 AM     Labs: Basic Metabolic Panel:  Recent Labs Lab 07/14/16 0300 07/15/16 0222  07/17/16 0504 07/18/16 0531 07/19/16 0535  NA 132* 128*  < > 133* 133* 135  K 3.8 3.6  < > 3.6 3.5 3.8  CL 99* 98*  < > 99* 100* 101  CO2 21* 20*  < > 26 25 25   GLUCOSE 198* 153*  < >  142* 141* 144*  BUN 35* 51*  < > 35* 45* 52*  CREATININE 4.08* 4.26*  < > 3.02* 3.39* 3.51*  CALCIUM 7.2* 7.3*  < > 7.6* 7.6* 7.7*  PHOS 3.8 4.0  --   --   --  3.9  < > = values in this interval not displayed. Liver Function Tests:  Recent Labs Lab 07/17/16 0504 07/18/16 0531 07/19/16 0535  AST 20 17 17   ALT 25 17 15*  ALKPHOS 295* 255* 206*  BILITOT 3.5* 3.1* 2.3*  PROT 4.7* 4.8* 4.8*  ALBUMIN 1.7* 1.7* 1.7*    Recent Labs Lab 07/13/16 0932  LIPASE 15   No results for input(s): AMMONIA in the last 168 hours. CBC:  Recent Labs Lab 07/16/16 0744 07/17/16 0504 07/17/16 1429 07/18/16 0531 07/19/16 0535  WBC 15.6* 10.2 8.7 5.7 4.8  NEUTROABS 14.8*  --   --  4.5 3.7  HGB 8.2* 8.2* 8.0* 8.1* 8.3*  HCT 24.3* 24.6* 24.0* 24.9* 25.5*  MCV 88.0 88.8 88.9 90.9 91.4  PLT 27* 19* 43* 55* 52*   Cardiac Enzymes: No  results for input(s): CKTOTAL, CKMB, CKMBINDEX, TROPONINI in the last 168 hours. CBG: No results for input(s): GLUCAP in the last 168 hours.  Studies/Results: Ir Perc Cholecystostomy  Result Date: 07/17/2016 CLINICAL DATA:  Ultrasound, MRCP, and hepatobiliary scintigraphy suggest acute cholecystitis. Thrombocytopenia. EXAM: PERCUTANEOUS CHOLECYSTOSTOMY TUBE PLACEMENT WITH ULTRASOUND AND FLUOROSCOPIC GUIDANCE FLUOROSCOPY TIME:  36 seconds, 6 mGy TECHNIQUE: The procedure, risks (including but not limited to bleeding, infection, organ damage ), benefits, and alternatives were explained to the patient. Questions regarding the procedure were encouraged and answered. The patient understands and consents to the procedure. Survey ultrasound of the abdomen was performed and an appropriate skin entry site was identified. Skin site was marked, prepped with Betadine, and draped in usual sterile fashion, and infiltrated locally with 1% lidocaine.Intravenous Fentanyl and Versed were administered as conscious sedation during continuous monitoring of the patient's level of consciousness and physiological / cardiorespiratory status by AV the radiology RN, with a total moderate sedation time of 10 minutes. Under real-time ultrasound guidance, gallbladder was accessed using a transhepatic approach with a 21-gauge needle. Ultrasound image documentation was saved. Bile returned through the hub. Needle was exchanged over a 018 guidewire for transitional dilator which allowed placement of 035 J wire. Over this, a 10.2 French pigtail catheter was advanced and formed centrally in the gallbladder lumen. Small contrast injection confirmed appropriate position. Catheter secured externally with 0 Prolene suture and placed external drain bag. Patient tolerated the procedure well. COMPLICATIONS: COMPLICATIONS none IMPRESSION: 1. Technically successful percutaneous cholecystostomy tube placement with ultrasound and fluoroscopic guidance.  Electronically Signed   By: Lucrezia Europe M.D.   On: 07/17/2016 17:15   Medications:   . darbepoetin (ARANESP) injection - DIALYSIS  200 mcg Intravenous Q Sat-HD  . famotidine (PEPCID) IV  20 mg Intravenous Q24H  . feeding supplement (ENSURE ENLIVE)  237 mL Oral BID BM  . [START ON 07/20/2016] levofloxacin  500 mg Oral Q48H  . midodrine  5 mg Oral TID WC  . protein supplement  1 scoop Oral TID WC

## 2016-07-19 NOTE — Progress Notes (Signed)
[facility] Progress Note     Subjective: Much more alert today, less tired. Reports shart, intermittent RUQ abdominal pain that is non-radiating and limited to the area around his drain. When the pain occurs he rates it at a 8-9/10. Patient ate better at breakfast today and is tolerating PO, still doesn't like ensure but has not tried mixing his new protein powder with liquids. Last BM was yesterday. UOP increasing slightly. Has not worked with therapies yet today.  Patients family requesting info about drain care/flushing drain.  Drain OP overnight: 15 cc per EPIC, still dark brown bile  Objective: Vital signs in last 24 hours: Temp:  [98 F (36.7 C)-99.3 F (37.4 C)] 98 F (36.7 C) (08/09 1000) Pulse Rate:  [87-97] 95 (08/09 1000) Resp:  [17-18] 17 (08/09 1000) BP: (126-137)/(62-71) 127/67 (08/09 1000) SpO2:  [92 %-98 %] 96 % (08/09 1000) Weight:  [78.9 kg (174 lb)] 78.9 kg (174 lb) (08/08 2127) Last BM Date: 07/15/16  Intake/Output from previous day: 08/08 0701 - 08/09 0700 In: 61 [P.O.:360; IV Piggyback:100] Out: 280 [Urine:250; Drains:30] Intake/Output this shift: Total I/O In: 240 [P.O.:240] Out: -   PE: Gen:  Alert, NAD, pleasant Card:  RRR, no M/G/R, pedal pulses 2+ Pulm:  CTA, no W/R/R, breathing is nonlabored Abd: Soft, TTP RUQ, ND, +BS, drain in place with clean dressing and < 5 cc in bag.  Ext:  BL LE edema   Lab Results:   Recent Labs  07/18/16 0531 07/19/16 0535  WBC 5.7 4.8  HGB 8.1* 8.3*  HCT 24.9* 25.5*  PLT 55* 52*   BMET  Recent Labs  07/18/16 0531 07/19/16 0535  NA 133* 135  K 3.5 3.8  CL 100* 101  CO2 25 25  GLUCOSE 141* 144*  BUN 45* 52*  CREATININE 3.39* 3.51*  CALCIUM 7.6* 7.7*   PT/INR  Recent Labs  07/17/16 0932  LABPROT 19.7*  INR 1.65   CMP     Component Value Date/Time   NA 135 07/19/2016 0535   NA 134 (L) 07/13/2016 0810   K 3.8 07/19/2016 0535   K 4.2 07/13/2016 0810   CL 101 07/19/2016 0535    CO2 25 07/19/2016 0535   CO2 23 07/13/2016 0810   GLUCOSE 144 (H) 07/19/2016 0535   GLUCOSE 152 (H) 07/13/2016 0810   BUN 52 (H) 07/19/2016 0535   BUN 28.6 (H) 07/13/2016 0810   CREATININE 3.51 (H) 07/19/2016 0535   CREATININE 3.3 (HH) 07/13/2016 0810   CALCIUM 7.7 (L) 07/19/2016 0535   CALCIUM 8.4 07/13/2016 0810   PROT 4.8 (L) 07/19/2016 0535   PROT 6.4 07/13/2016 0810   ALBUMIN 1.7 (L) 07/19/2016 0535   ALBUMIN 2.3 (L) 07/13/2016 0810   AST 17 07/19/2016 0535   AST 67 (H) 07/13/2016 0810   ALT 15 (L) 07/19/2016 0535   ALT 74 (H) 07/13/2016 0810   ALKPHOS 206 (H) 07/19/2016 0535   ALKPHOS 251 (H) 07/13/2016 0810   BILITOT 2.3 (H) 07/19/2016 0535   BILITOT 3.43 (H) 07/13/2016 0810   GFRNONAA 16 (L) 07/19/2016 0535   GFRAA 19 (L) 07/19/2016 0535   Lipase     Component Value Date/Time   LIPASE 15 07/13/2016 0932       Studies/Results: Ir Perc Cholecystostomy  Result Date: 07/17/2016 CLINICAL DATA:  Ultrasound, MRCP, and hepatobiliary scintigraphy suggest acute cholecystitis. Thrombocytopenia. EXAM: PERCUTANEOUS CHOLECYSTOSTOMY TUBE PLACEMENT WITH ULTRASOUND AND FLUOROSCOPIC GUIDANCE FLUOROSCOPY TIME:  36 seconds, 6 mGy TECHNIQUE: The procedure,  risks (including but not limited to bleeding, infection, organ damage ), benefits, and alternatives were explained to the patient. Questions regarding the procedure were encouraged and answered. The patient understands and consents to the procedure. Survey ultrasound of the abdomen was performed and an appropriate skin entry site was identified. Skin site was marked, prepped with Betadine, and draped in usual sterile fashion, and infiltrated locally with 1% lidocaine.Intravenous Fentanyl and Versed were administered as conscious sedation during continuous monitoring of the patient's level of consciousness and physiological / cardiorespiratory status by AV the radiology RN, with a total moderate sedation time of 10 minutes. Under real-time  ultrasound guidance, gallbladder was accessed using a transhepatic approach with a 21-gauge needle. Ultrasound image documentation was saved. Bile returned through the hub. Needle was exchanged over a 018 guidewire for transitional dilator which allowed placement of 035 J wire. Over this, a 10.2 French pigtail catheter was advanced and formed centrally in the gallbladder lumen. Small contrast injection confirmed appropriate position. Catheter secured externally with 0 Prolene suture and placed external drain bag. Patient tolerated the procedure well. COMPLICATIONS: COMPLICATIONS none IMPRESSION: 1. Technically successful percutaneous cholecystostomy tube placement with ultrasound and fluoroscopic guidance. Electronically Signed   By: Lucrezia Europe M.D.   On: 07/17/2016 17:15    Anti-infectives: Anti-infectives    Start     Dose/Rate Route Frequency Ordered Stop   07/20/16 1200  levofloxacin (LEVAQUIN) tablet 500 mg     500 mg Oral Every 48 hours 07/18/16 1113     07/18/16 1200  levofloxacin (LEVAQUIN) tablet 750 mg     750 mg Oral  Once 07/18/16 1113 07/18/16 1309   07/17/16 1800  vancomycin (VANCOCIN) IVPB 1000 mg/200 mL premix  Status:  Discontinued     1,000 mg 200 mL/hr over 60 Minutes Intravenous To Radiology 07/17/16 1701 07/18/16 1108   07/15/16 1800  aztreonam (AZACTAM) 1 g in dextrose 5 % 50 mL IVPB  Status:  Discontinued     1 g 100 mL/hr over 30 Minutes Intravenous Every 24 hours 07/14/16 1232 07/18/16 1108   07/15/16 1200  vancomycin (VANCOCIN) IVPB 750 mg/150 ml premix  Status:  Discontinued     750 mg 150 mL/hr over 60 Minutes Intravenous Every T-Th-Sa (Hemodialysis) 07/14/16 1232 07/15/16 1445   07/14/16 1900  vancomycin (VANCOCIN) IVPB 750 mg/150 ml premix  Status:  Discontinued     750 mg 150 mL/hr over 60 Minutes Intravenous Every 24 hours 07/13/16 1740 07/14/16 1232   07/14/16 1800  fluconazole (DIFLUCAN) IVPB 200 mg  Status:  Discontinued     200 mg 100 mL/hr over 60 Minutes  Intravenous Every 24 hours 07/13/16 1701 07/14/16 0949   07/14/16 1500  levofloxacin (LEVAQUIN) IVPB 500 mg  Status:  Discontinued     500 mg 100 mL/hr over 60 Minutes Intravenous Every 48 hours 07/13/16 1731 07/14/16 0949   07/13/16 2200  metroNIDAZOLE (FLAGYL) IVPB 500 mg  Status:  Discontinued     500 mg 100 mL/hr over 60 Minutes Intravenous Every 8 hours 07/13/16 1732 07/17/16 1409   07/13/16 1745  aztreonam (AZACTAM) 1 g in dextrose 5 % 50 mL IVPB  Status:  Discontinued     1 g 100 mL/hr over 30 Minutes Intravenous Every 8 hours 07/13/16 1731 07/14/16 1232   07/13/16 1730  fluconazole (DIFLUCAN) IVPB 400 mg     400 mg 100 mL/hr over 120 Minutes Intravenous  Once 07/13/16 1701 07/13/16 2251   07/13/16 1700  vancomycin (VANCOCIN) 1,500  mg in sodium chloride 0.9 % 500 mL IVPB     1,500 mg 250 mL/hr over 120 Minutes Intravenous  Once 07/13/16 1651 07/13/16 1951   07/13/16 1445  ciprofloxacin (CIPRO) IVPB 400 mg     400 mg 200 mL/hr over 60 Minutes Intravenous  Once 07/13/16 1434 07/13/16 1555   07/13/16 1445  metroNIDAZOLE (FLAGYL) IVPB 500 mg     500 mg 100 mL/hr over 60 Minutes Intravenous  Once 07/13/16 1434 07/13/16 1721     Assessment/Plan Acalculous cholecystitis s/p perc drain placement Colitis S/p Gastrostomy tube placement 07/17/16 Dr. Arne Cleveland  - abx for a total of 2 weeks of for GB coverage.  - final drain culture still pending - currently shows GNR.  - Continue working with therapies  Klebsiella pneumonia, bacteremia - improving  ID: cipro 8/3 once, vanc 8/3- 8/5. Flagyl 8/3-8/7, aztreonam 8/3-8/7, levofloxacin for a total of 3 weeks  DVT Proph: SCD's Code status: Full Code  Patient appetite and alertness improved from yesterday. Appropriately tender on exam. Final drain cx still pending. Still recommend working with therapies prior to discharge. Will discuss readiness for discharge with MD.   LOS: 6 days    Jill Alexanders , [facility]  Surgery 07/19/2016, 11:33 AM Pager: [phone] Consults: [phone] Mon-Fri 7:00 am-4:30 pm Sat-Sun 7:00 am-11:30 am

## 2016-07-20 ENCOUNTER — Ambulatory Visit: Payer: PPO

## 2016-07-20 LAB — COMPREHENSIVE METABOLIC PANEL
ALT: 15 U/L — AB (ref 17–63)
AST: 21 U/L (ref 15–41)
Albumin: 1.6 g/dL — ABNORMAL LOW (ref 3.5–5.0)
Alkaline Phosphatase: 208 U/L — ABNORMAL HIGH (ref 38–126)
Anion gap: 10 (ref 5–15)
BUN: 58 mg/dL — AB (ref 6–20)
CO2: 25 mmol/L (ref 22–32)
Calcium: 7.8 mg/dL — ABNORMAL LOW (ref 8.9–10.3)
Chloride: 99 mmol/L — ABNORMAL LOW (ref 101–111)
Creatinine, Ser: 3.77 mg/dL — ABNORMAL HIGH (ref 0.61–1.24)
GFR, EST AFRICAN AMERICAN: 18 mL/min — AB (ref >60)
GFR, EST NON AFRICAN AMERICAN: 15 mL/min — AB (ref >60)
GLUCOSE: 164 mg/dL — AB (ref 65–99)
POTASSIUM: 3.6 mmol/L (ref 3.5–5.1)
SODIUM: 134 mmol/L — AB (ref 135–145)
TOTAL PROTEIN: 4.9 g/dL — AB (ref 6.5–8.1)
Total Bilirubin: 1.8 mg/dL — ABNORMAL HIGH (ref 0.3–1.2)

## 2016-07-20 LAB — CBC WITH DIFFERENTIAL/PLATELET
BASOS PCT: 0 %
Basophils Absolute: 0 10*3/uL (ref 0.0–0.1)
EOS ABS: 0 10*3/uL (ref 0.0–0.7)
Eosinophils Relative: 0 %
HCT: 24.2 % — ABNORMAL LOW (ref 39.0–52.0)
Hemoglobin: 8 g/dL — ABNORMAL LOW (ref 13.0–17.0)
LYMPHS ABS: 0.5 10*3/uL — AB (ref 0.7–4.0)
Lymphocytes Relative: 8 %
MCH: 29.7 pg (ref 26.0–34.0)
MCHC: 33.1 g/dL (ref 30.0–36.0)
MCV: 90 fL (ref 78.0–100.0)
MONO ABS: 0.5 10*3/uL (ref 0.1–1.0)
Monocytes Relative: 8 %
NEUTROS PCT: 84 %
Neutro Abs: 5.1 10*3/uL (ref 1.7–7.7)
PLATELETS: 61 10*3/uL — AB (ref 150–400)
RBC: 2.69 MIL/uL — ABNORMAL LOW (ref 4.22–5.81)
RDW: 16 % — AB (ref 11.5–15.5)
WBC: 6.1 10*3/uL (ref 4.0–10.5)

## 2016-07-20 MED ORDER — MIDODRINE HCL 5 MG PO TABS
10.0000 mg | ORAL_TABLET | Freq: Three times a day (TID) | ORAL | Status: DC
  Administered 2016-07-20 – 2016-07-31 (×28): 10 mg via ORAL
  Filled 2016-07-20 (×27): qty 2

## 2016-07-20 MED ORDER — PENTAFLUOROPROP-TETRAFLUOROETH EX AERO: 1.0000 "application " | INHALATION_SPRAY | CUTANEOUS | Status: DC | PRN

## 2016-07-20 MED ORDER — SODIUM CHLORIDE 0.9 % IV SOLN: 100.0000 mL | INTRAVENOUS | Status: DC | PRN

## 2016-07-20 MED ORDER — LIDOCAINE-PRILOCAINE 2.5-2.5 % EX CREA: 1.0000 "application " | TOPICAL_CREAM | CUTANEOUS | Status: DC | PRN

## 2016-07-20 MED ORDER — LIDOCAINE HCL (PF) 1 % IJ SOLN: 5.0000 mL | INTRAMUSCULAR | Status: DC | PRN

## 2016-07-20 MED ORDER — FAMOTIDINE 20 MG PO TABS
20.0000 mg | ORAL_TABLET | Freq: Every day | ORAL | Status: DC
  Administered 2016-07-20 – 2016-08-02 (×14): 20 mg via ORAL
  Filled 2016-07-20 (×14): qty 1

## 2016-07-20 MED ORDER — ALTEPLASE 2 MG IJ SOLR: 2.0000 mg | Freq: Once | INTRAMUSCULAR | Status: DC | PRN

## 2016-07-20 MED ORDER — MIDODRINE HCL 5 MG PO TABS
ORAL_TABLET | ORAL | Status: AC
  Administered 2016-07-20: 10 mg via ORAL
  Filled 2016-07-20: qty 2

## 2016-07-20 MED ORDER — DEXTROSE 5 % IV SOLN
500.0000 mg | Freq: Three times a day (TID) | INTRAVENOUS | Status: DC | PRN
Start: 2016-07-20 — End: 2016-07-30
  Administered 2016-07-20 – 2016-07-24 (×3): 500 mg via INTRAVENOUS
  Filled 2016-07-20 (×6): qty 5

## 2016-07-20 NOTE — Progress Notes (Signed)
Occupational Therapy Treatment Patient Details Name: Shade Kaley MRN: [id number] DOB: 08-Jul-1946 Today's Date: 07/20/2016    History of present illness   70 year old Caucasian man with past medical history significant for ESRD secondary to multiple myeloma with metastasis to spine and left femur who has been on dialysis for the past 2 months or so (with possibly some partial renal recovery).Started on new chemo drug last week (7/31) and developed significant diarrhea afterwards. He presented on  8/3 with one-day history of nausea, vomiting and abdominal pain Pt did have percutaneous cholecystomy drain placed 8/7.     OT comments  OT explained importance of moving in detail.  Pt did perform BUE ROM- 5 reps of each arm with MAX encouragement.  Family stated when pt was on 4th floor they had permission to get him up. Explained pt needed much more A at this time and yesterday BP dropped.  Family ask OT to return later today. OT explained she would try as schedule allowed   Follow Up Recommendations  CIR;SNF    Equipment Recommendations  None recommended by OT    Recommendations for Other Services      Precautions / Restrictions Precautions Precaution Comments: drain R side Restrictions Weight Bearing Restrictions: No       Mobility Bed Mobility               General bed mobility comments: refused  Transfers                 General transfer comment: refused        ADL Overall ADL's : Needs assistance/impaired                                       General ADL Comments: Pt in bed stating he was sore and was waiting on a muscle relaxer. Pt refuses pain meds per pt.  OT encouraged pt to sit EOB as pt described all over soreness but refused at htis time due to he was waiting on muscle relaxer.  Encouraged pt to sit EOB or get OOB andhe refused.                  Cognition   Behavior During Therapy: WFL for tasks assessed/performed Overall  Cognitive Status: Within Functional Limits for tasks assessed                       Extremity/Trunk Assessment               Exercises   Encouraged BUE ROM to A with stiffness and soreness             General Comments Pt is very hard to motivate.He complains of soreness and weakness but is not willing to move . Pt hopeful muscle relaxer is going to fix all his soreness and OT explained moving was also important      Pertinent Vitals/ Pain       Faces Pain Scale: Hurts little more Pain Descriptors / Indicators: Sore         Frequency       Progress Toward Goals  OT Goals(current goals can now be found in the care plan section)  Progress towards OT goals: Not progressing toward goals - comment (pt with limited particpation)     Plan Discharge plan needs to be updated       End  of Session     Activity Tolerance Patient limited by fatigue;Patient limited by pain   Patient Left in bed;with call bell/phone within reach;with family/visitor present   Nurse Communication          Time: 6606-3016 OT Time Calculation (min): 10 min  Charges: OT Treatments $Therapeutic Activity: 8-22 mins  Mykell Rawl D 07/20/2016, 11:36 AM

## 2016-07-20 NOTE — Progress Notes (Signed)
Rehab Admissions Coordinator Note:  Patient was screened by Retta Diones for appropriateness for an Inpatient Acute Rehab Consult.  At this time, we are recommending [facility]. I am not sure that we could get authorization from insurance carrier given current diagnosis.  If MD wishes to pursue inpatient rehab consult, we can then have rehab PA/MD evaluate patient.  Retta Diones 07/20/2016, 10:23 AM  I can be reached at [phone].

## 2016-07-20 NOTE — Progress Notes (Signed)
[facility] Progress Note     Subjective: Continued abdominal soreness around drain site, states he is supposed to get a muscle relaxer for increased pain control. Reports he sat up on edge of bed with PT yesterday. Still decreased appetite, no big change from yesterday. Trying to mix the protein powder with a liquid other than water in order to tolerate. Flutter valve was still not in patients room, I got it from the supply room myself and showed the patient how to use it. He is only pulling 200-250 on IS, we discussed the importance of long, slow, deep breaths.   RUQ drain - 35cc/24h per EPIC reports  Objective: Vital signs in last 24 hours: Temp:  [97.7 F (36.5 C)-99.6 F (37.6 C)] 98.1 F (36.7 C) (08/10 0559) Pulse Rate:  [87-119] 87 (08/10 0559) Resp:  [18-20] 18 (08/10 0559) BP: (103-146)/(58-75) 138/65 (08/10 0559) SpO2:  [90 %-99 %] 98 % (08/10 0559) Weight:  [73.9 kg (162 lb 14.7 oz)-73.9 kg (163 lb)] 73.9 kg (162 lb 14.7 oz) (08/10 0800) Last BM Date: 07/19/16  Intake/Output from previous day: 08/09 0701 - 08/10 0700 In: 600 [P.O.:600] Out: 855 [Urine:850; Drains:5] Intake/Output this shift: Total I/O In: 64 [P.O.:50] Out: 500 [Urine:500]  PE: Gen:  Alert, NAD, pleasant Card:  RRR, no M/G/R  Pulm:  CTA, diminished sounds at lung bases BL Abd: Soft, appropriately tender, ND, +BS; RUQ drain in place with <5cc dark bilious output   Lab Results:   Recent Labs  07/19/16 0535 07/20/16 0500  WBC 4.8 6.1  HGB 8.3* 8.0*  HCT 25.5* 24.2*  PLT 52* 61*   BMET  Recent Labs  07/19/16 0535 07/20/16 0500  NA 135 134*  K 3.8 3.6  CL 101 99*  CO2 25 25  GLUCOSE 144* 164*  BUN 52* 58*  CREATININE 3.51* 3.77*  CALCIUM 7.7* 7.8*   PT/INR No results for input(s): LABPROT, INR in the last 72 hours. CMP     Component Value Date/Time   NA 134 (L) 07/20/2016 0500   NA 134 (L) 07/13/2016 0810   K 3.6 07/20/2016 0500   K 4.2 07/13/2016 0810   CL 99  (L) 07/20/2016 0500   CO2 25 07/20/2016 0500   CO2 23 07/13/2016 0810   GLUCOSE 164 (H) 07/20/2016 0500   GLUCOSE 152 (H) 07/13/2016 0810   BUN 58 (H) 07/20/2016 0500   BUN 28.6 (H) 07/13/2016 0810   CREATININE 3.77 (H) 07/20/2016 0500   CREATININE 3.3 (HH) 07/13/2016 0810   CALCIUM 7.8 (L) 07/20/2016 0500   CALCIUM 8.4 07/13/2016 0810   PROT 4.9 (L) 07/20/2016 0500   PROT 6.4 07/13/2016 0810   ALBUMIN 1.6 (L) 07/20/2016 0500   ALBUMIN 2.3 (L) 07/13/2016 0810   AST 21 07/20/2016 0500   AST 67 (H) 07/13/2016 0810   ALT 15 (L) 07/20/2016 0500   ALT 74 (H) 07/13/2016 0810   ALKPHOS 208 (H) 07/20/2016 0500   ALKPHOS 251 (H) 07/13/2016 0810   BILITOT 1.8 (H) 07/20/2016 0500   BILITOT 3.43 (H) 07/13/2016 0810   GFRNONAA 15 (L) 07/20/2016 0500   GFRAA 18 (L) 07/20/2016 0500   Lipase     Component Value Date/Time   LIPASE 15 07/13/2016 0932   Studies/Results: No results found.  Anti-infectives: Anti-infectives    Start     Dose/Rate Route Frequency Ordered Stop   07/20/16 1200  levofloxacin (LEVAQUIN) tablet 500 mg     500 mg Oral Every 48 hours  07/18/16 1113     07/18/16 1200  levofloxacin (LEVAQUIN) tablet 750 mg     750 mg Oral  Once 07/18/16 1113 07/18/16 1309   07/17/16 1800  vancomycin (VANCOCIN) IVPB 1000 mg/200 mL premix  Status:  Discontinued     1,000 mg 200 mL/hr over 60 Minutes Intravenous To Radiology 07/17/16 1701 07/18/16 1108   07/15/16 1800  aztreonam (AZACTAM) 1 g in dextrose 5 % 50 mL IVPB  Status:  Discontinued     1 g 100 mL/hr over 30 Minutes Intravenous Every 24 hours 07/14/16 1232 07/18/16 1108   07/15/16 1200  vancomycin (VANCOCIN) IVPB 750 mg/150 ml premix  Status:  Discontinued     750 mg 150 mL/hr over 60 Minutes Intravenous Every T-Th-Sa (Hemodialysis) 07/14/16 1232 07/15/16 1445   07/14/16 1900  vancomycin (VANCOCIN) IVPB 750 mg/150 ml premix  Status:  Discontinued     750 mg 150 mL/hr over 60 Minutes Intravenous Every 24 hours 07/13/16 1740  07/14/16 1232   07/14/16 1800  fluconazole (DIFLUCAN) IVPB 200 mg  Status:  Discontinued     200 mg 100 mL/hr over 60 Minutes Intravenous Every 24 hours 07/13/16 1701 07/14/16 0949   07/14/16 1500  levofloxacin (LEVAQUIN) IVPB 500 mg  Status:  Discontinued     500 mg 100 mL/hr over 60 Minutes Intravenous Every 48 hours 07/13/16 1731 07/14/16 0949   07/13/16 2200  metroNIDAZOLE (FLAGYL) IVPB 500 mg  Status:  Discontinued     500 mg 100 mL/hr over 60 Minutes Intravenous Every 8 hours 07/13/16 1732 07/17/16 1409   07/13/16 1745  aztreonam (AZACTAM) 1 g in dextrose 5 % 50 mL IVPB  Status:  Discontinued     1 g 100 mL/hr over 30 Minutes Intravenous Every 8 hours 07/13/16 1731 07/14/16 1232   07/13/16 1730  fluconazole (DIFLUCAN) IVPB 400 mg     400 mg 100 mL/hr over 120 Minutes Intravenous  Once 07/13/16 1701 07/13/16 2251   07/13/16 1700  vancomycin (VANCOCIN) 1,500 mg in sodium chloride 0.9 % 500 mL IVPB     1,500 mg 250 mL/hr over 120 Minutes Intravenous  Once 07/13/16 1651 07/13/16 1951   07/13/16 1445  ciprofloxacin (CIPRO) IVPB 400 mg     400 mg 200 mL/hr over 60 Minutes Intravenous  Once 07/13/16 1434 07/13/16 1555   07/13/16 1445  metroNIDAZOLE (FLAGYL) IVPB 500 mg     500 mg 100 mL/hr over 60 Minutes Intravenous  Once 07/13/16 1434 07/13/16 1721     Assessment/Plan Acalculous cholecystitis s/p perc drain placement Colitis S/p Gastrostomy tube placement 07/17/16 Dr. Arne Cleveland  - final drain culture shows enterobacter cloacae and klebsiella pneumoniae - abx for a total of 2 weeks per ID recommendations.  - outpatient follow-up with Dr. Redmond Pulling has been arranged. - Continue working with therapies -- current plan is for patient to return home with [facility] but I think he would benefit from SNF/rehab for regular PT/nursing care, it appears PT/OT are also recommending SNF/CIR.  Klebsiella pneumonia, bacteremia - improving  ID: cipro 8/3 once, vanc 8/3- 8/5. Flagyl 8/3-8/7, aztreonam  8/3-8/7, levofloxacin for a total of 3 weeks  DVT Proph: SCD's Code status: Full Code   LOS: 7 days    Jill Alexanders , [facility] 07/20/2016, 11:26 AM Pager: [phone] Consults: [phone] Mon-Fri 7:00 am-4:30 pm Sat-Sun 7:00 am-11:30 am

## 2016-07-20 NOTE — Progress Notes (Addendum)
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 He presented on  8/3 with one-day history of nausea, vomiting and abdominal pain and some preceding diarrhea prior to that. Developed septic shock after days of diarrhea Reports decreased ability to tolerate oral intake. In the emergency room he was noted to be hypotensive and ultrasound suggestive of acalculous cholecystitis-seen by surgery and awaiting HIDA scan. He remained hypotensive upon transfer to the ICU and was started on pressors. Subsequently transferred to [facility] 8/6. Now approaching discharge pending decision about continuation of hemodialysis   Assessment and plan  Hypotension > resolved, off all vasopressors  Klebsiella pneumoniae sepsis - -Patient evaluated by general surgery initially, for abnormal liver function, possible CBD stone. Presentation was concerning for acalculous cholecystitis  , surgery recommended percutaneous drain ,Percutaneous cholecystostomy drain placed 8/7 in IR Sepsis physiology improving vancomycin discontinued 8/5, also discontinued Flagyl and aztreonam, patient now switched to levofloxacin for a total of 3 weeks for Klebsiella pneumonia bacteremia  MRI/MRCP showed findings c/w cholecystitis, no  stones, no CBD stones along with asc and des colitis   Thrombocytopenia-in the setting of multiple myeloma, received 2 units of packed red blood cell transfusions prior to IR procedure Serum light chain has dropped significantly to near normal ratio Due to recent pancytopenia, treatment is placed on hold Pancytopenia has improved since discontinuation of Revlimid.  HIT panel negative  anemia is related to chronic renal failure. He is getting ESA through dialysis center Discussed with Heath Lark, MD , no further platelet transfusions indicated at this time, platelets 61   Transaminitis - improving, passed biliary stone?Percutaneous cholecystostomy drain placed 8/7 in IR Appreciate surgery, IR, GI, HH RN will need to flush drain 1 x/day with 5-10 cc sterile saline Drain to remain 6-8 weeks Plan to see IR OP [facility] - unless goes to OR Blood cultures are positive for  Klebsiella pneumonia ,sepsis is resolving.    End-stage renal disease: Secondary to multiple myeloma and getting hemodialysis via right IJ tunneled dialysis catheter- last done on Tuesday. .    Continue with bladder scans, in and out cath when needed - refusing in and out caths- "they are not giving me enough time to go " attempted to explain the rationale of not letting liters of urine stay in bladder to no avail. Nephrology following and Dr. Jonnie Finner to decide if patient is going to need hemodialysis As perNephrology Will hold off on HD for today and follow creatinine    Multiple myeloma-On chemotherapy/radiation to left femur-further management per hematology,Due to recent pancytopenia, treatment is placed on hold Continue aggressive supportive care,He has appointment to follow-up in the oncology clinic next week on 07/17/2016  Orthostatic hypotension-continue midodrine    DVT prophylaxsis SCDs  Code  Status:  Full code    Family Communication: Discussed in detail with the patient, wife, daughter, all imaging  results, lab results explained to the patient   Disposition Plan:   Anticipate discharge in one to 2 days with home health, pending renal clearance     Consultants:  Critical care  General surgery  Nephrology   STUDIES:  abd u/s 8/3>>> Distended gallbladder with mild gallbladder wall thickening and gallbladder sludge. Could not exclude acalculous cholecystitis.  Normal caliber common bile duct.  Small amount of right upper quadrant ascites and right pleural effusion.  CULTURES: BC x 2 8/3>>>   ANTIBIOTICS: Levofloxacin 8/8- Vancomycin 8/3-8/5 Aztreonam 8/3-8/8        LINES/TUBES: R IJ Perm cath 6/1>>>       HPI/Subjective: Patient very dizzy and lightheaded when tries to sit on the edge of the bed or in the chair  Objective: Vitals:   07/19/16 1759 07/19/16 2052 07/19/16 2058 07/20/16 0559  BP: 129/63 134/66  138/65  Pulse: 91 87  87  Resp: _0 Temp: 99.6 F (37.6 C) 97.7 F (36.5 C)  98.1 F (36.7 C)  TempSrc: Oral Oral  Oral  SpO2: 97% 99%  98%  Weight:   73.9 kg (163 lb)   Height:        Intake/Output Summary (Last 24 hours) at 07/20/16 0913 Last data filed at 07/20/16 0314  Gross per 24 hour  Intake              360 ml  Output              855 ml  Net             -495 ml    Exam:  Examination:  General exam: Appears calm and comfortable  Respiratory system: Clear to auscultation. Respiratory effort normal. Cardiovascular system: S1 & S2 heard, RRR. No JVD, murmurs, rubs, gallops or clicks. No pedal edema. Gastrointestinal system: Abdomen is nondistended, soft and nontender. No organomegaly or masses felt. Normal bowel sounds heard. Central nervous system: Alert and oriented. No focal neurological deficits. Extremities: Symmetric 5 x 5 power. Skin: No rashes, lesions or ulcers Psychiatry: Judgement and insight appear normal. Mood & affect appropriate.     Data Reviewed: I have personally reviewed following labs and imaging  studies  Micro Results Recent Results (from the past 240 hour(s))  Culture, blood (routine x 2)     Status: None   Collection Time: 07/13/16  4:20 PM  Result Value Ref Range Status   Specimen Description BLOOD RIGHT WRIST  Final   Special Requests BOTTLES DRAWN AEROBIC AND ANAEROBIC 5CC  Final   Culture NO GROWTH 5 DAYS  Final   Report Status 07/18/2016 FINAL  Final  Culture, blood (routine x 2)     Status: Abnormal   Collection Time: 07/13/16  5:04 PM  Result Value Ref Range Status   Specimen Description BLOOD RIGHT HAND BAER  Final   Special Requests 3CC  Final   Culture  Setup Time   Final    GRAM NEGATIVE RODS AEROBIC BOTTLE ONLY CRITICAL RESULT CALLED TO, READ BACK BY AND VERIFIED WITH: M MACCIA,PHARMD AT 1006 07/15/16 BY L BENFIELD    Culture KLEBSIELLA PNEUMONIAE (A)  Final   Report Status 07/17/2016 FINAL  Final   Organism ID, Bacteria KLEBSIELLA PNEUMONIAE  Final      Susceptibility   Klebsiella pneumoniae - MIC*    AMPICILLIN >=32 RESISTANT Resistant  CEFAZOLIN <=4 SENSITIVE Sensitive     CEFEPIME <=1 SENSITIVE Sensitive     CEFTAZIDIME <=1 SENSITIVE Sensitive     CEFTRIAXONE <=1 SENSITIVE Sensitive     CIPROFLOXACIN 1 SENSITIVE Sensitive     GENTAMICIN <=1 SENSITIVE Sensitive     IMIPENEM <=0.25 SENSITIVE Sensitive     TRIMETH/SULFA 40 SENSITIVE Sensitive     AMPICILLIN/SULBACTAM >=32 RESISTANT Resistant     PIP/TAZO 16 SENSITIVE Sensitive     Extended ESBL NEGATIVE Sensitive     * KLEBSIELLA PNEUMONIAE  Blood Culture ID Panel (Reflexed)     Status: Abnormal   Collection Time: 07/13/16  5:04 PM  Result Value Ref Range Status   Enterococcus species NOT DETECTED NOT DETECTED Final   Vancomycin resistance NOT DETECTED NOT DETECTED Final   Listeria monocytogenes NOT DETECTED NOT DETECTED Final   Staphylococcus species NOT DETECTED NOT DETECTED Final   Staphylococcus aureus NOT DETECTED NOT DETECTED Final   Methicillin resistance NOT DETECTED NOT DETECTED Final    Streptococcus species NOT DETECTED NOT DETECTED Final   Streptococcus agalactiae NOT DETECTED NOT DETECTED Final   Streptococcus pneumoniae NOT DETECTED NOT DETECTED Final   Streptococcus pyogenes NOT DETECTED NOT DETECTED Final   Acinetobacter baumannii NOT DETECTED NOT DETECTED Final   Enterobacteriaceae species DETECTED (A) NOT DETECTED Final    Comment: CRITICAL RESULT CALLED TO, READ BACK BY AND VERIFIED WITH: M MACCIA,PHARMD AT 1006 07/15/16 BY L BENFIELD    Enterobacter cloacae complex NOT DETECTED NOT DETECTED Final   Escherichia coli NOT DETECTED NOT DETECTED Final   Klebsiella oxytoca NOT DETECTED NOT DETECTED Final   Klebsiella pneumoniae DETECTED (A) NOT DETECTED Final    Comment: CRITICAL RESULT CALLED TO, READ BACK BY AND VERIFIED WITH: M MACCIA,PHARMD AT 1006 07/15/16 BY L BENFIELD    Proteus species NOT DETECTED NOT DETECTED Final   Serratia marcescens NOT DETECTED NOT DETECTED Final   Carbapenem resistance NOT DETECTED NOT DETECTED Final   Haemophilus influenzae NOT DETECTED NOT DETECTED Final   Neisseria meningitidis NOT DETECTED NOT DETECTED Final   Pseudomonas aeruginosa NOT DETECTED NOT DETECTED Final   Candida albicans NOT DETECTED NOT DETECTED Final   Candida glabrata NOT DETECTED NOT DETECTED Final   Candida krusei NOT DETECTED NOT DETECTED Final   Candida parapsilosis NOT DETECTED NOT DETECTED Final   Candida tropicalis NOT DETECTED NOT DETECTED Final  MRSA PCR Screening     Status: None   Collection Time: 07/13/16  6:31 PM  Result Value Ref Range Status   MRSA by PCR NEGATIVE NEGATIVE Final    Comment:        The GeneXpert MRSA Assay (FDA approved for NASAL specimens only), is one component of a comprehensive MRSA colonization surveillance program. It is not intended to diagnose MRSA infection nor to guide or monitor treatment for MRSA infections.   Aerobic/Anaerobic Culture (surgical/deep wound)     Status: None (Preliminary result)   Collection  Time: 07/17/16  5:18 PM  Result Value Ref Range Status   Specimen Description DRAINAGE BILE  Final   Special Requests Normal  Final   Gram Stain   Final    RARE WBC PRESENT, PREDOMINANTLY MONONUCLEAR RARE GRAM NEGATIVE RODS    Culture MODERATE GRAM NEGATIVE RODS  Final   Report Status PENDING  Incomplete    Radiology Reports Nm Hepatobiliary Liver Func  Result Date: 07/14/2016 CLINICAL DATA:  Right upper quadrant pain. Abnormal sonographic appearance of the gallbladder. EXAM: NUCLEAR MEDICINE HEPATOBILIARY IMAGING TECHNIQUE:  Sequential images of the abdomen were obtained out to 120 minutes following intravenous administration of radiopharmaceutical. After IV administered of 3 mg morphine, imaging obtained for an additional 30 minutes. RADIOPHARMACEUTICALS:  5.2  mCi Tc-32m Choletec IV COMPARISON:  Right upper quadrant ultrasound yesterday. FINDINGS: Imaging obtained for 2 hours and 30 minutes. There is mildly delayed hepatic uptake. There is no visualized tracer accumulation within the intrahepatic ducts. At 35 minutes tracer is visualized in the midline, which may be duodenum or common bile duct. There is midline tracer increases in intensity with to and fro trace accumulation proximal and distally likely refluxed into the stomach and passing distally proximal small bowel. After 120 minutes, no gallbladder activity. As small bowel activity was visualized, 3 mg of morphine was administered. Increased GI tract activity, no gallbladder visualization after morphine. IMPRESSION: Gallbladder not visualized after 2.5 hours as well as the administration of morphine. In addition however the intra and extrahepatic bile ducts are not well seen. Nonvisualization of the gallbladder may be secondary to cholecystitis versus underlying hepatocellular dysfunction (patient bilirubin greater than 3). Limited visualization of the biliary tree activity is likely secondary to hepatic cellular dysfunction. Biliary  obstruction is felt much less likely as GI tract activity is observed. Electronically Signed   By: MJeb LeveringM.D.   On: 07/14/2016 02:59   Mr Abdomen Mrcp Wo Cm  Result Date: 07/17/2016 CLINICAL DATA:  Right upper quadrant pain. Elevated liver function tests. History of multiple myeloma. EXAM: MRI ABDOMEN WITHOUT CONTRAST  (INCLUDING MRCP) TECHNIQUE: Multiplanar multisequence MR imaging of the abdomen was performed. Heavily T2-weighted images of the biliary and pancreatic ducts were obtained, and three-dimensional MRCP images were rendered by post processing. COMPARISON:  Ultrasound or nuclear medicine hepatobiliary study of 07/13/2016. FINDINGS: Mild to moderate motion degradation throughout. Lower chest: Left larger than right pleural effusions with bibasilar airspace disease. Normal heart size without pericardial or pleural effusion. Hepatobiliary: No focal liver lesion. No intrahepatic biliary duct dilatation. Gallbladder sludge with mild gallbladder distension. Gallbladder wall thickening including at 10 mm on image 27/series 4. Mild pericholecystic fluid, including on image 25/series 5. No evidence of common duct stone. Pancreas: Mild pancreatic atrophy, without duct dilatation or mass. Limited evaluation for acute pancreatitis, secondary to relatively diffuse abdominal edema. Spleen: Mild splenomegaly, 14.0 cm craniocaudal. Adrenals/Urinary Tract: Normal adrenal glands. Bilateral renal lesions which are likely cysts. No hydronephrosis. Stomach/Bowel: Normal stomach and abdominal small bowel loops. There is wall thickening of and edema within both the ascending and descending colon, including on images 21/series 3 and 44/series 5. Vascular/Lymphatic: Normal aortic caliber. No retroperitoneal or retrocrural adenopathy. Other: Small volume ascites.  Diffuse anasarca. Musculoskeletal: Heterogeneous marrow signal with is foci of T2 hyperintensity within the lower lumbar and lower thoracic spine.  IMPRESSION: 1. Motion degraded exam. 2. No biliary duct dilatation or choledocholithiasis. 3. Gallbladder distension with wall thickening and pericholecystic fluid. Concurrent gallbladder sludge. Findings are suspicious for acute cholecystitis. 4. Bilateral pleural effusions and ascites, suggesting fluid overload. 5. Heterogeneous marrow signal with foci of T2 hyperintensity within. Likely related to the clinical history of multiple myeloma. 6. Multi focal colitis.  Distribution favors infection. Electronically Signed   By: KAbigail MiyamotoM.D.   On: 07/17/2016 07:55   Mr 3d Recon At Scanner  Result Date: 07/17/2016 CLINICAL DATA:  Right upper quadrant pain. Elevated liver function tests. History of multiple myeloma. EXAM: MRI ABDOMEN WITHOUT CONTRAST  (INCLUDING MRCP) TECHNIQUE: Multiplanar multisequence MR imaging of the abdomen was performed.  Heavily T2-weighted images of the biliary and pancreatic ducts were obtained, and three-dimensional MRCP images were rendered by post processing. COMPARISON:  Ultrasound or nuclear medicine hepatobiliary study of 07/13/2016. FINDINGS: Mild to moderate motion degradation throughout. Lower chest: Left larger than right pleural effusions with bibasilar airspace disease. Normal heart size without pericardial or pleural effusion. Hepatobiliary: No focal liver lesion. No intrahepatic biliary duct dilatation. Gallbladder sludge with mild gallbladder distension. Gallbladder wall thickening including at 10 mm on image 27/series 4. Mild pericholecystic fluid, including on image 25/series 5. No evidence of common duct stone. Pancreas: Mild pancreatic atrophy, without duct dilatation or mass. Limited evaluation for acute pancreatitis, secondary to relatively diffuse abdominal edema. Spleen: Mild splenomegaly, 14.0 cm craniocaudal. Adrenals/Urinary Tract: Normal adrenal glands. Bilateral renal lesions which are likely cysts. No hydronephrosis. Stomach/Bowel: Normal stomach and abdominal  small bowel loops. There is wall thickening of and edema within both the ascending and descending colon, including on images 21/series 3 and 44/series 5. Vascular/Lymphatic: Normal aortic caliber. No retroperitoneal or retrocrural adenopathy. Other: Small volume ascites.  Diffuse anasarca. Musculoskeletal: Heterogeneous marrow signal with is foci of T2 hyperintensity within the lower lumbar and lower thoracic spine. IMPRESSION: 1. Motion degraded exam. 2. No biliary duct dilatation or choledocholithiasis. 3. Gallbladder distension with wall thickening and pericholecystic fluid. Concurrent gallbladder sludge. Findings are suspicious for acute cholecystitis. 4. Bilateral pleural effusions and ascites, suggesting fluid overload. 5. Heterogeneous marrow signal with foci of T2 hyperintensity within. Likely related to the clinical history of multiple myeloma. 6. Multi focal colitis.  Distribution favors infection. Electronically Signed   By: Abigail Miyamoto M.D.   On: 07/17/2016 07:55   Ir Perc Cholecystostomy  Result Date: 07/17/2016 CLINICAL DATA:  Ultrasound, MRCP, and hepatobiliary scintigraphy suggest acute cholecystitis. Thrombocytopenia. EXAM: PERCUTANEOUS CHOLECYSTOSTOMY TUBE PLACEMENT WITH ULTRASOUND AND FLUOROSCOPIC GUIDANCE FLUOROSCOPY TIME:  36 seconds, 6 mGy TECHNIQUE: The procedure, risks (including but not limited to bleeding, infection, organ damage ), benefits, and alternatives were explained to the patient. Questions regarding the procedure were encouraged and answered. The patient understands and consents to the procedure. Survey ultrasound of the abdomen was performed and an appropriate skin entry site was identified. Skin site was marked, prepped with Betadine, and draped in usual sterile fashion, and infiltrated locally with 1% lidocaine.Intravenous Fentanyl and Versed were administered as conscious sedation during continuous monitoring of the patient's level of consciousness and physiological /  cardiorespiratory status by AV the radiology RN, with a total moderate sedation time of 10 minutes. Under real-time ultrasound guidance, gallbladder was accessed using a transhepatic approach with a 21-gauge needle. Ultrasound image documentation was saved. Bile returned through the hub. Needle was exchanged over a 018 guidewire for transitional dilator which allowed placement of 035 J wire. Over this, a 10.2 French pigtail catheter was advanced and formed centrally in the gallbladder lumen. Small contrast injection confirmed appropriate position. Catheter secured externally with 0 Prolene suture and placed external drain bag. Patient tolerated the procedure well. COMPLICATIONS: COMPLICATIONS none IMPRESSION: 1. Technically successful percutaneous cholecystostomy tube placement with ultrasound and fluoroscopic guidance. Electronically Signed   By: Lucrezia Europe M.D.   On: 07/17/2016 17:15   Dg Chest Port 1 View  Result Date: 07/13/2016 CLINICAL DATA:  Effusion. Abnormal labs.Hx of CKD, dialysis patient.Ex-smoker, quit ~30 years ago. EXAM: PORTABLE CHEST 1 VIEW COMPARISON:  05/24/2016 FINDINGS: Cardiac silhouette is normal in size. No mediastinal or hilar masses or evidence of adenopathy. There is vascular congestion. Left greater than right pleural effusions  are noted. There is some hazy opacity in the lung bases, likely due to atelectasis. No pneumothorax. Right internal jugular dual-lumen central venous catheter is stable and well positioned. IMPRESSION: 1. Small, left greater than right, pleural effusions with associated lung base opacity, most likely atelectasis. 2. Central vascular congestion without overt pulmonary edema. Electronically Signed   By: Lajean Manes M.D.   On: 07/13/2016 17:27   US Abdomen Limited Ruq  Result Date: 07/13/2016 CLINICAL DATA:  Right upper quadrant pain for 2 days. Nausea and vomiting for 1 day. EXAM: US ABDOMEN LIMITED - RIGHT UPPER QUADRANT COMPARISON:  None. FINDINGS:  Gallbladder: The gallbladder is mildly distended measuring 5.2 cm in transverse dimension. This also mild gallbladder wall thickening and gallbladder sludge. No definite gallstones or pericholecystic fluid. Negative sonographic Murphy sign. Common bile duct: Diameter: 4.1 mm Liver: Normal echogenicity without focal lesion or biliary dilatation. Small amount of right upper quadrant ascites. Right pleural effusion noted. IMPRESSION: 1. Distended gallbladder with mild gallbladder wall thickening and gallbladder sludge. Could not exclude acalculous cholecystitis. 2. Normal caliber common bile duct. 3. Normal liver. 4. Small amount of right upper quadrant ascites and right pleural effusion. Electronically Signed   By: Marijo Sanes M.D.   On: 07/13/2016 12:05     CBC  Recent Labs Lab 07/16/16 0744 07/17/16 0504 07/17/16 1429 07/18/16 0531 07/19/16 0535 07/20/16 0500  WBC 15.6* 10.2 8.7 5.7 4.8 6.1  HGB 8.2* 8.2* 8.0* 8.1* 8.3* 8.0*  HCT 24.3* 24.6* 24.0* 24.9* 25.5* 24.2*  PLT 27* 19* 43* 55* 52* 61*  MCV 88.0 88.8 88.9 90.9 91.4 90.0  MCH 29.7 29.6 29.6 29.6 29.7 29.7  MCHC 33.7 33.3 33.3 32.5 32.5 33.1  RDW 16.2* 16.0* 16.1* 16.1* 15.9* 16.0*  LYMPHSABS 0.2*  --   --  0.4* 0.4* 0.5*  MONOABS 0.6  --   --  0.8 0.7 0.5  EOSABS 0.0  --   --  0.0 0.0 0.0  BASOSABS 0.0  --   --  0.0 0.0 0.0    Chemistries   Recent Labs Lab 07/14/16 0300 07/15/16 0222  07/16/16 0744 07/17/16 0504 07/18/16 0531 07/19/16 0535 07/20/16 0500  NA 132* 128*  --  132* 133* 133* 135 134*  K 3.8 3.6  --  3.6 3.6 3.5 3.8 3.6  CL 99* 98*  --  97* 99* 100* 101 99*  CO2 21* 20*  --  _0 GLUCOSE 198* 153*  --  129* 142* 141* 144* 164*  BUN 35* 51*  --  24* 35* 45* 52* 58*  CREATININE 4.08* 4.26*  --  2.48* 3.02* 3.39* 3.51* 3.77*  CALCIUM 7.2* 7.3*  --  7.4* 7.6* 7.6* 7.7* 7.8*  MG 1.2* 2.1  --   --   --   --   --   --   AST 44*  --   < > 38 _1 ALT 51  --   < > 34 25 17 15* 15*   ALKPHOS 226*  --   < > 298* 295* 255* 206* 208*  BILITOT 5.0*  --   < > 3.7* 3.5* 3.1* 2.3* 1.8*  < > = values in this interval not displayed. ------------------------------------------------------------------------------------------------------------------ estimated creatinine clearance is 19.6 mL/min (by C-G formula based on SCr of 3.77 mg/dL). ------------------------------------------------------------------------------------------------------------------ No results for input(s): HGBA1C in the last 72 hours. ------------------------------------------------------------------------------------------------------------------ No results for input(s): CHOL, HDL, LDLCALC, TRIG, CHOLHDL, LDLDIRECT in the last 72  hours. ------------------------------------------------------------------------------------------------------------------ No results for input(s): TSH, T4TOTAL, T3FREE, THYROIDAB in the last 72 hours.  Invalid input(s): FREET3 ------------------------------------------------------------------------------------------------------------------ No results for input(s): VITAMINB12, FOLATE, FERRITIN, TIBC, IRON, RETICCTPCT in the last 72 hours.  Coagulation profile  Recent Labs Lab 07/17/16 0932  INR 1.65    No results for input(s): DDIMER in the last 72 hours.  Cardiac Enzymes No results for input(s): CKMB, TROPONINI, MYOGLOBIN in the last 168 hours.  Invalid input(s): CK ------------------------------------------------------------------------------------------------------------------ Invalid input(s): POCBNP   CBG: No results for input(s): GLUCAP in the last 168 hours.     Studies: No results found.    No results found for: HGBA1C Lab Results  Component Value Date   CREATININE 3.77 (H) 07/20/2016       Scheduled Meds: . darbepoetin (ARANESP) injection - DIALYSIS  200 mcg Intravenous Q Sat-HD  . famotidine (PEPCID) IV  20 mg Intravenous Q24H  . feeding  supplement (ENSURE ENLIVE)  237 mL Oral BID BM  . furosemide  160 mg Oral Q8H  . levofloxacin  500 mg Oral Q48H  . midodrine  5 mg Oral TID WC  . multivitamin  1 tablet Oral QHS  . protein supplement  1 scoop Oral TID WC  . vitamin B-12  1,000 mcg Oral Daily  . Vitamin D (Ergocalciferol)  50,000 Units Oral Q7 days   Continuous Infusions:     LOS: 7 days    Time spent: >30 MINS    [facility]  [facility] Pager [phone]. If 7PM-7AM, please contact night-coverage at [email or web address], password [facility] 07/20/2016, 9:13 AM  LOS: 7 days

## 2016-07-20 NOTE — Progress Notes (Signed)
Subjective:  Po Lasix started Yest and per wife 1050 cc urine op yest and 500 cc this am/ co some funnny taste in mouth po intake minimal / last HD was past sat 07/15/16/ Some R sided abd discomfort , PT eval in progress ? Rehab admit  Objective Vital signs in last 24 hours: Vitals:   07/19/16 2052 07/19/16 2058 07/20/16 0559 07/20/16 0800  BP: 134/66  138/65   Pulse: 87  87   Resp: 20  18   Temp: 97.7 F (36.5 C)  98.1 F (36.7 C)   TempSrc: Oral  Oral   SpO2: 99%  98%   Weight:  73.9 kg (163 lb)  73.9 kg (162 lb 14.7 oz)  Height:    _0  (1.854 m)   Weight change: -4.99 kg (-11 lb)  Physical Exam: General:  Thin elderly WM OX3 alert, NAD Heart: RRR, no rub Lungs:  CTA  Bilat / non labored breathing  Abdomen: soft,sl  tender/ Non distended/ Right Upper Quad Drain brownish  liquid fluid collection   Extremities:  Trace pitting dependent edema Left only (ho L lower extrem frac  With rod)/ No asterixis  Dialysis Access: R IJ  PC and left AVF pos bruit ( placed 6/14)   Dialysis: TTS SW 4h 73kg 3K/ 2.25 bath Hep 7300 R IJ cath/ maturing L [facility] Aranesp 40 g weekly     pth 133 no vit d   Problem/Plan: 1.Abdominal pain/nausea/vomiting: cholecystitis-sp  IR  perc chole drainage 8/07 / CCS  Recommended IR drain instead of Surgery 2  Blood Cx:1/2 klebsiellapneumoniae.  On Levoquin po 3.End-stage renal disease: recent diagnosis, renal bx showed myeloma cast neph w diffuse ATN and diffuse mild interstitial scarring. Yesterday started Lasix 18m q 8 hr  with ^^ uop/ wt down but  By bed wts / scr 3.77< 3.51 yest ?? possible he could be recovering some function.  Last  HD was Saturday 07/15/16 /Will hold off on HD for today and another 1-2 days perhaps, monitoring   creat. Patient feeling worse and worse every day, wants to try HD to see if it makes a difference. Plan HD today and prob tomorrow.  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 GAlvy Bimler- noted yest "Serum light chain has dropped significantly to near normal ratio/ Due to recent pancytopenia, treatment is placed on hold. FU Rx next week in clinic" 6. CKD-MBD:.  pth was  133 as op and no vit d needed. Last phos 4.0  07/15/16 and no binder needed as op  With all phos less than 4.0 on op labs /  Fu am renal labs  7 HTN/vol- on admit pitting edema and hyponatremia- had Uf with HD sat/   128 to 134  Na  today./ on Midodrine  533mtid  bp stable  Now    DaErnest HaberPA-C [id number]/09/2016,10:01 AM  LOS: 7 days   Pt seen, examined, agree w assess/plan as above with additions as indicated.  RoKelly SplinterD [facility] pager [phone]  cell [phone]/09/2016, 12:55 PM     Labs: Basic Metabolic Panel:  Recent Labs Lab 07/14/16 0300 07/15/16 0222  07/18/16 0531 07/19/16 0535 07/20/16 0500  NA 132* 128*  < > 133* 135 134*  K 3.8 3.6  < > 3.5  3.8 3.6  CL 99* 98*  < > 100* 101 99*  CO2 21* 20*  < > _0 GLUCOSE 198* 153*  < > 141* 144* 164*  BUN 35* 51*  < > 45* 52* 58*  CREATININE 4.08* 4.26*  < > 3.39* 3.51* 3.77*  CALCIUM 7.2* 7.3*  < > 7.6* 7.7* 7.8*  PHOS 3.8 4.0  --   --  3.9  --   < > = values in this interval not displayed. Liver Function Tests:  Recent Labs Lab 07/18/16 0531 07/19/16 0535 07/20/16 0500  AST _1 ALT 17 15* 15*  ALKPHOS 255* 206* 208*  BILITOT 3.1* 2.3* 1.8*  PROT 4.8* 4.8* 4.9*  ALBUMIN 1.7* 1.7* 1.6*   No results for input(s): LIPASE, AMYLASE in the last 168 hours. No results for input(s): AMMONIA in the last 168 hours. CBC:  Recent Labs Lab 07/17/16 0504 07/17/16 1429 07/18/16 0531 07/19/16 0535 07/20/16 0500  WBC 10.2 8.7 5.7 4.8 6.1  NEUTROABS  --   --  4.5 3.7 5.1  HGB 8.2* 8.0* 8.1* 8.3* 8.0*  HCT 24.6* 24.0* 24.9*  25.5* 24.2*  MCV 88.8 88.9 90.9 91.4 90.0  PLT 19* 43* 55* 52* 61*   Cardiac Enzymes: No results for input(s): CKTOTAL, CKMB, CKMBINDEX, TROPONINI in the last 168 hours. CBG: No results for input(s): GLUCAP in the last 168 hours.  Studies/Results: No results found. Medications:   . darbepoetin (ARANESP) injection - DIALYSIS  200 mcg Intravenous Q Sat-HD  . famotidine (PEPCID) IV  20 mg Intravenous Q24H  . feeding supplement (ENSURE ENLIVE)  237 mL Oral BID BM  . furosemide  160 mg Oral Q8H  . levofloxacin  500 mg Oral Q48H  . midodrine  10 mg Oral TID WC  . multivitamin  1 tablet Oral QHS  . protein supplement  1 scoop Oral TID WC  . vitamin B-12  1,000 mcg Oral Daily  . Vitamin D (Ergocalciferol)  50,000 Units Oral Q7 days

## 2016-07-20 NOTE — Progress Notes (Signed)
Patient reports of" feeling funny" that lasted for about one minute, and he is  unable to describe how he feels.  VSS, heart rate unchanged skin warm/dry, answers all questions correctly will continue to monitor.

## 2016-07-21 ENCOUNTER — Encounter: Payer: PPO | Admitting: Vascular Surgery

## 2016-07-21 ENCOUNTER — Ambulatory Visit: Payer: PPO

## 2016-07-21 LAB — CBC
HCT: 23.6 % — ABNORMAL LOW (ref 39.0–52.0)
Hemoglobin: 7.7 g/dL — ABNORMAL LOW (ref 13.0–17.0)
MCH: 30.3 pg (ref 26.0–34.0)
MCHC: 32.6 g/dL (ref 30.0–36.0)
MCV: 92.9 fL (ref 78.0–100.0)
Platelets: 75 10*3/uL — ABNORMAL LOW (ref 150–400)
RBC: 2.54 MIL/uL — AB (ref 4.22–5.81)
RDW: 15.9 % — AB (ref 11.5–15.5)
WBC: 8.1 10*3/uL (ref 4.0–10.5)

## 2016-07-21 LAB — COMPREHENSIVE METABOLIC PANEL
ALT: 16 U/L — AB (ref 17–63)
AST: 28 U/L (ref 15–41)
Albumin: 1.6 g/dL — ABNORMAL LOW (ref 3.5–5.0)
Alkaline Phosphatase: 199 U/L — ABNORMAL HIGH (ref 38–126)
Anion gap: 10 (ref 5–15)
BILIRUBIN TOTAL: 1.8 mg/dL — AB (ref 0.3–1.2)
BUN: 31 mg/dL — AB (ref 6–20)
CALCIUM: 7.8 mg/dL — AB (ref 8.9–10.3)
CO2: 28 mmol/L (ref 22–32)
CREATININE: 2.56 mg/dL — AB (ref 0.61–1.24)
Chloride: 98 mmol/L — ABNORMAL LOW (ref 101–111)
GFR, EST AFRICAN AMERICAN: 28 mL/min — AB (ref >60)
GFR, EST NON AFRICAN AMERICAN: 24 mL/min — AB (ref >60)
Glucose, Bld: 157 mg/dL — ABNORMAL HIGH (ref 65–99)
Potassium: 4.1 mmol/L (ref 3.5–5.1)
Sodium: 136 mmol/L (ref 135–145)
TOTAL PROTEIN: 4.9 g/dL — AB (ref 6.5–8.1)

## 2016-07-21 MED ORDER — HEPARIN SODIUM (PORCINE) 1000 UNIT/ML DIALYSIS: 1000.0000 [IU] | INTRAMUSCULAR | Status: DC | PRN

## 2016-07-21 MED ORDER — LIDOCAINE HCL (PF) 1 % IJ SOLN: 5.0000 mL | INTRAMUSCULAR | Status: DC | PRN

## 2016-07-21 MED ORDER — OXYCODONE HCL 5 MG PO TABS
5.0000 mg | ORAL_TABLET | ORAL | Status: DC | PRN
  Administered 2016-07-21 – 2016-07-26 (×4): 5 mg via ORAL
  Filled 2016-07-21 (×7): qty 1

## 2016-07-21 MED ORDER — MORPHINE SULFATE (PF) 2 MG/ML IV SOLN
1.0000 mg | Freq: Four times a day (QID) | INTRAVENOUS | Status: DC | PRN
  Administered 2016-07-21: 1 mg via INTRAVENOUS
  Filled 2016-07-21: qty 1

## 2016-07-21 MED ORDER — ALTEPLASE 2 MG IJ SOLR: 2.0000 mg | Freq: Once | INTRAMUSCULAR | Status: DC | PRN

## 2016-07-21 MED ORDER — LIDOCAINE-PRILOCAINE 2.5-2.5 % EX CREA: 1.0000 "application " | TOPICAL_CREAM | CUTANEOUS | Status: DC | PRN

## 2016-07-21 MED ORDER — PENTAFLUOROPROP-TETRAFLUOROETH EX AERO: 1.0000 "application " | INHALATION_SPRAY | CUTANEOUS | Status: DC | PRN

## 2016-07-21 MED ORDER — ONDANSETRON HCL 4 MG/2ML IJ SOLN: 4.0000 mg | Freq: Four times a day (QID) | INTRAMUSCULAR | Status: DC | PRN

## 2016-07-21 MED ORDER — SODIUM CHLORIDE 0.9 % IV SOLN: 100.0000 mL | INTRAVENOUS | Status: DC | PRN

## 2016-07-21 MED ORDER — MORPHINE SULFATE (PF) 2 MG/ML IV SOLN
2.0000 mg | Freq: Once | INTRAVENOUS | Status: AC
  Administered 2016-07-21: 1 mg via INTRAVENOUS
  Filled 2016-07-21: qty 1

## 2016-07-21 MED ORDER — OXYCODONE-ACETAMINOPHEN 5-325 MG PO TABS
ORAL_TABLET | ORAL | Status: AC
  Filled 2016-07-21: qty 1

## 2016-07-21 MED ORDER — OXYCODONE HCL 5 MG PO TABS
ORAL_TABLET | ORAL | Status: AC
  Filled 2016-07-21: qty 1

## 2016-07-21 NOTE — Progress Notes (Signed)
PROGRESS NOTE    Thomas [name]  WNI:[id number] DOB: Apr 16, 1946 DOA: 07/13/2016 PCP: No PCP Per Patient   Chief Complaint  Patient presents with  . Abdominal Pain  . abnormal labs  . Leg Pain    Brief Narrative:   Assessment & Plan   Hypotension -Patient did require vasopressors while in the ICU -Resolved, currently on midodrine   Sepsis secondary to Klebsiella pneumoniae bacteremia -blood cultures from 07/13/2016 +kleb pneumoniae -MRI/MRCP showed findings c/w cholecystitis, no stones, no CBD stones along with asc and des colitis -Patient evaluated by general surgery initially, for abnormal liver function, possible CBD stone. -Presentation was concerning for acalculous cholecystitis. Surgery recommended percutaneous drain which was placed by IR on 07/17/2016 -Sepsis physiology improving -vancomycin discontinued 8/5, also discontinued Flagyl and aztreonam -Transitioned to levofloxacin for a total of 3 weeks for Klebsiella pneumonia bacteremia   Anemia/Thrombocytopenia/Multiple myeloma -in the setting of multiple myeloma, received 2 units of packed red blood cell transfusions prior to IR procedure -Oncology consulted and appreciated -Serum light chain has dropped significantly to near normal ratio -Due to recent pancytopenia, treatment is placed on hold -Pancytopenia has improved since discontinuation of Revlimid.  HIT panel negative  -Anemia is related to chronic renal failure. He is getting ESA through dialysis center -Previous hospitalist discussed with Thomas Lark, MD (onc), no further platelet transfusions indicated at this time -Today Hemoglobin 7.7, platelets 75  Transaminitis/cholecystitis -Improving, passed biliary stone, s/p Percutaneous cholecystostomy drain placed 8/7 in IR -Gastroenterology, IR, general surgery consulted and appreciated -Drain to remain 6-8 weeks  End-stage renal disease -Secondary to multiple myeloma and getting hemodialysis via right IJ tunneled  dialysis catheter -Nephrology consulted and appreciated -Patient is to dialyze today and tomorrow -Creatinine today 2.56 -Per nephrology, patient to resume HD TTS   Multiple myeloma -On chemotherapy/radiation to left femur-further management per hematology -Due to recent pancytopenia, treatment is placed on hold -Continue aggressive supportive care -He has appointment to follow-up in the oncology clinic next week on 07/17/2016  Goals of care -Discussed possible palliative care consult with patient and family- for pain and symptom management, they declined at this time.  DVT Prophylaxis  SCDs  Code Status: Full  Family Communication: Wife and daughter at bedside  Disposition Plan: Admitted  Consultants Nephrology  General surgery Interventional radiology PCCM  Procedures  Abdominal US Percutaneous cholecystostomy drain placement  Antibiotics   Anti-infectives    Start     Dose/Rate Route Frequency Ordered Stop   07/20/16 1200  levofloxacin (LEVAQUIN) tablet 500 mg     500 mg Oral Every 48 hours 07/18/16 1113     07/18/16 1200  levofloxacin (LEVAQUIN) tablet 750 mg     750 mg Oral  Once 07/18/16 1113 07/18/16 1309   07/17/16 1800  vancomycin (VANCOCIN) IVPB 1000 mg/200 mL premix  Status:  Discontinued     1,000 mg 200 mL/hr over 60 Minutes Intravenous To Radiology 07/17/16 1701 07/18/16 1108   07/15/16 1800  aztreonam (AZACTAM) 1 g in dextrose 5 % 50 mL IVPB  Status:  Discontinued     1 g 100 mL/hr over 30 Minutes Intravenous Every 24 hours 07/14/16 1232 07/18/16 1108   07/15/16 1200  vancomycin (VANCOCIN) IVPB 750 mg/150 ml premix  Status:  Discontinued     750 mg 150 mL/hr over 60 Minutes Intravenous Every T-Th-Sa (Hemodialysis) 07/14/16 1232 07/15/16 1445   07/14/16 1900  vancomycin (VANCOCIN) IVPB 750 mg/150 ml premix  Status:  Discontinued     750 mg  150 mL/hr over 60 Minutes Intravenous Every 24 hours 07/13/16 1740 07/14/16 1232   07/14/16 1800  fluconazole  (DIFLUCAN) IVPB 200 mg  Status:  Discontinued     200 mg 100 mL/hr over 60 Minutes Intravenous Every 24 hours 07/13/16 1701 07/14/16 0949   07/14/16 1500  levofloxacin (LEVAQUIN) IVPB 500 mg  Status:  Discontinued     500 mg 100 mL/hr over 60 Minutes Intravenous Every 48 hours 07/13/16 1731 07/14/16 0949   07/13/16 2200  metroNIDAZOLE (FLAGYL) IVPB 500 mg  Status:  Discontinued     500 mg 100 mL/hr over 60 Minutes Intravenous Every 8 hours 07/13/16 1732 07/17/16 1409   07/13/16 1745  aztreonam (AZACTAM) 1 g in dextrose 5 % 50 mL IVPB  Status:  Discontinued     1 g 100 mL/hr over 30 Minutes Intravenous Every 8 hours 07/13/16 1731 07/14/16 1232   07/13/16 1730  fluconazole (DIFLUCAN) IVPB 400 mg     400 mg 100 mL/hr over 120 Minutes Intravenous  Once 07/13/16 1701 07/13/16 2251   07/13/16 1700  vancomycin (VANCOCIN) 1,500 mg in sodium chloride 0.9 % 500 mL IVPB     1,500 mg 250 mL/hr over 120 Minutes Intravenous  Once 07/13/16 1651 07/13/16 1951   07/13/16 1445  ciprofloxacin (CIPRO) IVPB 400 mg     400 mg 200 mL/hr over 60 Minutes Intravenous  Once 07/13/16 1434 07/13/16 1555   07/13/16 1445  metroNIDAZOLE (FLAGYL) IVPB 500 mg     500 mg 100 mL/hr over 60 Minutes Intravenous  Once 07/13/16 1434 07/13/16 1721      Subjective:   Thomas [name] seen and examined today. Patient complains of pain, wishes his gallbladder had been taken out instead of placement of the drain.  Complains of nausea and poor appetite. Denies chest pain, shortness of breath, dizziness, headache.   Objective:   Vitals:   07/20/16 1711 07/20/16 1952 07/21/16 0423 07/21/16 0955  BP: 118/64 136/64 111/79 132/65  Pulse: 94 (!) 53 91 93  Resp: 20 18 20 20   Temp:  98.3 F (36.8 C) 97.6 F (36.4 C)   TempSrc:  Oral Oral   SpO2:  93% 100% 99%  Weight: 73 kg (160 lb 15 oz)     Height:        Intake/Output Summary (Last 24 hours) at 07/21/16 1346 Last data filed at 07/21/16 1300  Gross per 24 hour  Intake               295 ml  Output             1825 ml  Net            -1530 ml   Filed Weights   07/20/16 0800 07/20/16 1336 07/20/16 1711  Weight: 73.9 kg (162 lb 14.7 oz) 73.8 kg (162 lb 11.2 oz) 73 kg (160 lb 15 oz)    Exam  General: Well developed, elderly, chronically ill appearing  HEENT: NCAT,  mucous membranes moist.   Cardiovascular: S1 S2 auscultated, no rubs, murmurs or gallops. Regular rate and rhythm.  Respiratory: Clear to auscultation bilaterally with equal chest rise  Abdomen: Soft, RUQ TTP/drain in place, nondistended, + bowel sounds  Extremities: warm dry without cyanosis clubbing. +LE edema  Neuro: AAOx3, nonfocal  Psych: Normal affect and demeanor with intact judgement and insight  Data Reviewed: I have personally reviewed following labs and imaging studies  CBC:  Recent Labs Lab 07/16/16 0744  07/17/16 1429 07/18/16 0531  07/19/16 0535 07/20/16 0500 07/21/16 0503  WBC 15.6*  < > 8.7 5.7 4.8 6.1 8.1  NEUTROABS 14.8*  --   --  4.5 3.7 5.1  --   HGB 8.2*  < > 8.0* 8.1* 8.3* 8.0* 7.7*  HCT 24.3*  < > 24.0* 24.9* 25.5* 24.2* 23.6*  MCV 88.0  < > 88.9 90.9 91.4 90.0 92.9  PLT 27*  < > 43* 55* 52* 61* 75*  < > = values in this interval not displayed. Basic Metabolic Panel:  Recent Labs Lab 07/15/16 0222  07/17/16 0504 07/18/16 0531 07/19/16 0535 07/20/16 0500 07/21/16 0503  NA 128*  < > 133* 133* 135 134* 136  K 3.6  < > 3.6 3.5 3.8 3.6 4.1  CL 98*  < > 99* 100* 101 99* 98*  CO2 20*  < > 26 25 25 25 28   GLUCOSE 153*  < > 142* 141* 144* 164* 157*  BUN 51*  < > 35* 45* 52* 58* 31*  CREATININE 4.26*  < > 3.02* 3.39* 3.51* 3.77* 2.56*  CALCIUM 7.3*  < > 7.6* 7.6* 7.7* 7.8* 7.8*  MG 2.1  --   --   --   --   --   --   PHOS 4.0  --   --   --  3.9  --   --   < > = values in this interval not displayed. GFR: Estimated Creatinine Clearance: 28.5 mL/min (by C-G formula based on SCr of 2.56 mg/dL). Liver Function Tests:  Recent Labs Lab 07/17/16 0504  07/18/16 0531 07/19/16 0535 07/20/16 0500 07/21/16 0503  AST 20 17 17 21 28   ALT 25 17 15* 15* 16*  ALKPHOS 295* 255* 206* 208* 199*  BILITOT 3.5* 3.1* 2.3* 1.8* 1.8*  PROT 4.7* 4.8* 4.8* 4.9* 4.9*  ALBUMIN 1.7* 1.7* 1.7* 1.6* 1.6*   No results for input(s): LIPASE, AMYLASE in the last 168 hours. No results for input(s): AMMONIA in the last 168 hours. Coagulation Profile:  Recent Labs Lab 07/17/16 0932  INR 1.65   Cardiac Enzymes: No results for input(s): CKTOTAL, CKMB, CKMBINDEX, TROPONINI in the last 168 hours. BNP (last 3 results) No results for input(s): PROBNP in the last 8760 hours. HbA1C: No results for input(s): HGBA1C in the last 72 hours. CBG: No results for input(s): GLUCAP in the last 168 hours. Lipid Profile: No results for input(s): CHOL, HDL, LDLCALC, TRIG, CHOLHDL, LDLDIRECT in the last 72 hours. Thyroid Function Tests: No results for input(s): TSH, T4TOTAL, FREET4, T3FREE, THYROIDAB in the last 72 hours. Anemia Panel: No results for input(s): VITAMINB12, FOLATE, FERRITIN, TIBC, IRON, RETICCTPCT in the last 72 hours. Urine analysis:    Component Value Date/Time   COLORURINE YELLOW 05/21/2016 1531   APPEARANCEUR CLOUDY (A) 05/21/2016 1531   LABSPEC 1.008 05/21/2016 1531   PHURINE 7.5 05/21/2016 1531   GLUCOSEU NEGATIVE 05/21/2016 1531   HGBUR LARGE (A) 05/21/2016 1531   BILIRUBINUR NEGATIVE 05/21/2016 1531   KETONESUR NEGATIVE 05/21/2016 1531   PROTEINUR 100 (A) 05/21/2016 1531   NITRITE NEGATIVE 05/21/2016 1531   LEUKOCYTESUR NEGATIVE 05/21/2016 1531   Sepsis Labs: @LABRCNTIP (procalcitonin:4,lacticidven:4)  ) Recent Results (from the past 240 hour(s))  Culture, blood (routine x 2)     Status: None   Collection Time: 07/13/16  4:20 PM  Result Value Ref Range Status   Specimen Description BLOOD RIGHT WRIST  Final   Special Requests BOTTLES DRAWN AEROBIC AND ANAEROBIC 5CC  Final   Culture NO GROWTH  5 DAYS  Final   Report Status 07/18/2016  FINAL  Final  Culture, blood (routine x 2)     Status: Abnormal   Collection Time: 07/13/16  5:04 PM  Result Value Ref Range Status   Specimen Description BLOOD RIGHT HAND BAER  Final   Special Requests 3CC  Final   Culture  Setup Time   Final    GRAM NEGATIVE RODS AEROBIC BOTTLE ONLY CRITICAL RESULT CALLED TO, READ BACK BY AND VERIFIED WITH: M MACCIA,PHARMD AT 1006 07/15/16 BY L BENFIELD    Culture KLEBSIELLA PNEUMONIAE (A)  Final   Report Status 07/17/2016 FINAL  Final   Organism ID, Bacteria KLEBSIELLA PNEUMONIAE  Final      Susceptibility   Klebsiella pneumoniae - MIC*    AMPICILLIN >=32 RESISTANT Resistant     CEFAZOLIN <=4 SENSITIVE Sensitive     CEFEPIME <=1 SENSITIVE Sensitive     CEFTAZIDIME <=1 SENSITIVE Sensitive     CEFTRIAXONE <=1 SENSITIVE Sensitive     CIPROFLOXACIN 1 SENSITIVE Sensitive     GENTAMICIN <=1 SENSITIVE Sensitive     IMIPENEM <=0.25 SENSITIVE Sensitive     TRIMETH/SULFA 40 SENSITIVE Sensitive     AMPICILLIN/SULBACTAM >=32 RESISTANT Resistant     PIP/TAZO 16 SENSITIVE Sensitive     Extended ESBL NEGATIVE Sensitive     * KLEBSIELLA PNEUMONIAE  Blood Culture ID Panel (Reflexed)     Status: Abnormal   Collection Time: 07/13/16  5:04 PM  Result Value Ref Range Status   Enterococcus species NOT DETECTED NOT DETECTED Final   Vancomycin resistance NOT DETECTED NOT DETECTED Final   Listeria monocytogenes NOT DETECTED NOT DETECTED Final   Staphylococcus species NOT DETECTED NOT DETECTED Final   Staphylococcus aureus NOT DETECTED NOT DETECTED Final   Methicillin resistance NOT DETECTED NOT DETECTED Final   Streptococcus species NOT DETECTED NOT DETECTED Final   Streptococcus agalactiae NOT DETECTED NOT DETECTED Final   Streptococcus pneumoniae NOT DETECTED NOT DETECTED Final   Streptococcus pyogenes NOT DETECTED NOT DETECTED Final   Acinetobacter baumannii NOT DETECTED NOT DETECTED Final   Enterobacteriaceae species DETECTED (A) NOT DETECTED Final     Comment: CRITICAL RESULT CALLED TO, READ BACK BY AND VERIFIED WITH: M MACCIA,PHARMD AT 1006 07/15/16 BY L BENFIELD    Enterobacter cloacae complex NOT DETECTED NOT DETECTED Final   Escherichia coli NOT DETECTED NOT DETECTED Final   Klebsiella oxytoca NOT DETECTED NOT DETECTED Final   Klebsiella pneumoniae DETECTED (A) NOT DETECTED Final    Comment: CRITICAL RESULT CALLED TO, READ BACK BY AND VERIFIED WITH: M MACCIA,PHARMD AT 1006 07/15/16 BY L BENFIELD    Proteus species NOT DETECTED NOT DETECTED Final   Serratia marcescens NOT DETECTED NOT DETECTED Final   Carbapenem resistance NOT DETECTED NOT DETECTED Final   Haemophilus influenzae NOT DETECTED NOT DETECTED Final   Neisseria meningitidis NOT DETECTED NOT DETECTED Final   Pseudomonas aeruginosa NOT DETECTED NOT DETECTED Final   Candida albicans NOT DETECTED NOT DETECTED Final   Candida glabrata NOT DETECTED NOT DETECTED Final   Candida krusei NOT DETECTED NOT DETECTED Final   Candida parapsilosis NOT DETECTED NOT DETECTED Final   Candida tropicalis NOT DETECTED NOT DETECTED Final  MRSA PCR Screening     Status: None   Collection Time: 07/13/16  6:31 PM  Result Value Ref Range Status   MRSA by PCR NEGATIVE NEGATIVE Final    Comment:        The GeneXpert MRSA Assay (FDA approved for NASAL  specimens only), is one component of a comprehensive MRSA colonization surveillance program. It is not intended to diagnose MRSA infection nor to guide or monitor treatment for MRSA infections.   Aerobic/Anaerobic Culture (surgical/deep wound)     Status: None (Preliminary result)   Collection Time: 07/17/16  5:18 PM  Result Value Ref Range Status   Specimen Description DRAINAGE BILE  Final   Special Requests Normal  Final   Gram Stain   Final    RARE WBC PRESENT, PREDOMINANTLY MONONUCLEAR RARE GRAM NEGATIVE RODS    Culture   Final    MODERATE ENTEROBACTER CLOACAE MODERATE KLEBSIELLA PNEUMONIAE NO ANAEROBES ISOLATED; CULTURE IN PROGRESS  FOR 5 DAYS    Report Status PENDING  Incomplete   Organism ID, Bacteria ENTEROBACTER CLOACAE  Final   Organism ID, Bacteria KLEBSIELLA PNEUMONIAE  Final      Susceptibility   Enterobacter cloacae - MIC*    CEFAZOLIN >=64 RESISTANT Resistant     CEFEPIME <=1 SENSITIVE Sensitive     CEFTAZIDIME <=1 SENSITIVE Sensitive     CEFTRIAXONE <=1 SENSITIVE Sensitive     CIPROFLOXACIN <=0.25 SENSITIVE Sensitive     GENTAMICIN <=1 SENSITIVE Sensitive     IMIPENEM 0.5 SENSITIVE Sensitive     TRIMETH/SULFA <=20 SENSITIVE Sensitive     PIP/TAZO <=4 SENSITIVE Sensitive     * MODERATE ENTEROBACTER CLOACAE   Klebsiella pneumoniae - MIC*    AMPICILLIN >=32 RESISTANT Resistant     CEFAZOLIN <=4 SENSITIVE Sensitive     CEFEPIME <=1 SENSITIVE Sensitive     CEFTAZIDIME <=1 SENSITIVE Sensitive     CEFTRIAXONE <=1 SENSITIVE Sensitive     CIPROFLOXACIN <=0.25 SENSITIVE Sensitive     GENTAMICIN <=1 SENSITIVE Sensitive     IMIPENEM <=0.25 SENSITIVE Sensitive     TRIMETH/SULFA <=20 SENSITIVE Sensitive     AMPICILLIN/SULBACTAM 4 SENSITIVE Sensitive     PIP/TAZO <=4 SENSITIVE Sensitive     Extended ESBL NEGATIVE Sensitive     * MODERATE KLEBSIELLA PNEUMONIAE      Radiology Studies: No results found.   Scheduled Meds: . darbepoetin (ARANESP) injection - DIALYSIS  200 mcg Intravenous Q Sat-HD  . famotidine  20 mg Oral Daily  . feeding supplement (ENSURE ENLIVE)  237 mL Oral BID BM  . levofloxacin  500 mg Oral Q48H  . midodrine  10 mg Oral TID WC  . multivitamin  1 tablet Oral QHS  . protein supplement  1 scoop Oral TID WC  . vitamin B-12  1,000 mcg Oral Daily  . Vitamin D (Ergocalciferol)  50,000 Units Oral Q7 days   Continuous Infusions:    LOS: 8 days   Time Spent in minutes   30 minutes  Keaston Pile D.O. on 07/21/2016 at 1:46 PM  Between 7am to 7pm - Pager - [phone]  After 7pm go to [email or web address] - password [email or web address]  And look for the night coverage person covering for me after  hours  [facility] Office  [phone]

## 2016-07-21 NOTE — Progress Notes (Signed)
[facility] Progress Note     Subjective: Resumed HD yesterday, going to HD again today. Feels about the same as yesterday. Muscle relaxer does improve the pain. Rates abdominal pain currently a 0/10. Intermittent sharp abdominal pain that lasts for seconds to minutes and is mostly located around GB drain site. When pain hits, it is rated at about an 8/10. Passing large amounts of flatus. Last BM was yesterday. Denies complications with drain overnight.   Drain OP: 5 cc/24h  Objective: Vital signs in last 24 hours: Temp:  [97.6 F (36.4 C)-98.3 F (36.8 C)] 97.6 F (36.4 C) (08/11 0423) Pulse Rate:  [53-108] 93 (08/11 0955) Resp:  [18-32] 20 (08/11 0955) BP: (98-136)/(45-79) 132/65 (08/11 0955) SpO2:  [93 %-100 %] 99 % (08/11 0955) Weight:  [73 kg (160 lb 15 oz)] 73 kg (160 lb 15 oz) (08/10 1711) Last BM Date: 07/19/16  Intake/Output from previous day: 08/10 0701 - 08/11 0700 In: 160 [P.O.:50; IV Piggyback:110] Out: 1675 [Urine:825] Intake/Output this shift: Total I/O In: 240 [P.O.:240] Out: 650 [Urine:650]  PE: Gen:  Alert, NAD, pleasant Card:  RRR, no M/G/R  Pulm:  CTA, no W/R/R, unlabored  Abd: Soft, TTP RUQ around drain site, nontender lower abdomen, ND, +BS, drain with trace brown bile noted Ext:  Palpable pedal pulses  Lab Results:   Recent Labs  07/20/16 0500 07/21/16 0503  WBC 6.1 8.1  HGB 8.0* 7.7*  HCT 24.2* 23.6*  PLT 61* 75*   BMET  Recent Labs  07/20/16 0500 07/21/16 0503  NA 134* 136  K 3.6 4.1  CL 99* 98*  CO2 25 28  GLUCOSE 164* 157*  BUN 58* 31*  CREATININE 3.77* 2.56*  CALCIUM 7.8* 7.8*   PT/INR No results for input(s): LABPROT, INR in the last 72 hours. CMP     Component Value Date/Time   NA 136 07/21/2016 0503   NA 134 (L) 07/13/2016 0810   K 4.1 07/21/2016 0503   K 4.2 07/13/2016 0810   CL 98 (L) 07/21/2016 0503   CO2 28 07/21/2016 0503   CO2 23 07/13/2016 0810   GLUCOSE 157 (H) 07/21/2016 0503   GLUCOSE 152  (H) 07/13/2016 0810   BUN 31 (H) 07/21/2016 0503   BUN 28.6 (H) 07/13/2016 0810   CREATININE 2.56 (H) 07/21/2016 0503   CREATININE 3.3 (HH) 07/13/2016 0810   CALCIUM 7.8 (L) 07/21/2016 0503   CALCIUM 8.4 07/13/2016 0810   PROT 4.9 (L) 07/21/2016 0503   PROT 6.4 07/13/2016 0810   ALBUMIN 1.6 (L) 07/21/2016 0503   ALBUMIN 2.3 (L) 07/13/2016 0810   AST 28 07/21/2016 0503   AST 67 (H) 07/13/2016 0810   ALT 16 (L) 07/21/2016 0503   ALT 74 (H) 07/13/2016 0810   ALKPHOS 199 (H) 07/21/2016 0503   ALKPHOS 251 (H) 07/13/2016 0810   BILITOT 1.8 (H) 07/21/2016 0503   BILITOT 3.43 (H) 07/13/2016 0810   GFRNONAA 24 (L) 07/21/2016 0503   GFRAA 28 (L) 07/21/2016 0503   Lipase     Component Value Date/Time   LIPASE 15 07/13/2016 0932   Studies/Results: No results found.  Anti-infectives: Anti-infectives    Start     Dose/Rate Route Frequency Ordered Stop   07/20/16 1200  levofloxacin (LEVAQUIN) tablet 500 mg     500 mg Oral Every 48 hours 07/18/16 1113     07/18/16 1200  levofloxacin (LEVAQUIN) tablet 750 mg     750 mg Oral  Once 07/18/16 1113 07/18/16 1309  07/17/16 1800  vancomycin (VANCOCIN) IVPB 1000 mg/200 mL premix  Status:  Discontinued     1,000 mg 200 mL/hr over 60 Minutes Intravenous To Radiology 07/17/16 1701 07/18/16 1108   07/15/16 1800  aztreonam (AZACTAM) 1 g in dextrose 5 % 50 mL IVPB  Status:  Discontinued     1 g 100 mL/hr over 30 Minutes Intravenous Every 24 hours 07/14/16 1232 07/18/16 1108   07/15/16 1200  vancomycin (VANCOCIN) IVPB 750 mg/150 ml premix  Status:  Discontinued     750 mg 150 mL/hr over 60 Minutes Intravenous Every T-Th-Sa (Hemodialysis) 07/14/16 1232 07/15/16 1445   07/14/16 1900  vancomycin (VANCOCIN) IVPB 750 mg/150 ml premix  Status:  Discontinued     750 mg 150 mL/hr over 60 Minutes Intravenous Every 24 hours 07/13/16 1740 07/14/16 1232   07/14/16 1800  fluconazole (DIFLUCAN) IVPB 200 mg  Status:  Discontinued     200 mg 100 mL/hr over 60  Minutes Intravenous Every 24 hours 07/13/16 1701 07/14/16 0949   07/14/16 1500  levofloxacin (LEVAQUIN) IVPB 500 mg  Status:  Discontinued     500 mg 100 mL/hr over 60 Minutes Intravenous Every 48 hours 07/13/16 1731 07/14/16 0949   07/13/16 2200  metroNIDAZOLE (FLAGYL) IVPB 500 mg  Status:  Discontinued     500 mg 100 mL/hr over 60 Minutes Intravenous Every 8 hours 07/13/16 1732 07/17/16 1409   07/13/16 1745  aztreonam (AZACTAM) 1 g in dextrose 5 % 50 mL IVPB  Status:  Discontinued     1 g 100 mL/hr over 30 Minutes Intravenous Every 8 hours 07/13/16 1731 07/14/16 1232   07/13/16 1730  fluconazole (DIFLUCAN) IVPB 400 mg     400 mg 100 mL/hr over 120 Minutes Intravenous  Once 07/13/16 1701 07/13/16 2251   07/13/16 1700  vancomycin (VANCOCIN) 1,500 mg in sodium chloride 0.9 % 500 mL IVPB     1,500 mg 250 mL/hr over 120 Minutes Intravenous  Once 07/13/16 1651 07/13/16 1951   07/13/16 1445  ciprofloxacin (CIPRO) IVPB 400 mg     400 mg 200 mL/hr over 60 Minutes Intravenous  Once 07/13/16 1434 07/13/16 1555   07/13/16 1445  metroNIDAZOLE (FLAGYL) IVPB 500 mg     500 mg 100 mL/hr over 60 Minutes Intravenous  Once 07/13/16 1434 07/13/16 1721       Assessment/Plan Acalculous cholecystitiss/p perc drain placement Colitis S/p Gastrostomy tube placement 07/17/16 Dr. Arne Cleveland  - final drain culture shows enterobacter cloacae and klebsiella pneumoniae - abx for a total of 2 weeks per ID recommendations.  - outpatient follow-up with Dr. Redmond Pulling has been arranged. - Continue working with therapies -- current plan is for patient to return home with [facility] but I think he would benefit from SNF/rehab for regular PT/nursing care, it appears PT/OT are also recommending SNF/CIR.  Klebsiella pneumonia, bacteremia- improving, on PO Levaquin  ESRD - Per nephrology; HD yesterday, today, and tomorrow. Then TTS.  Myeloma kidney  ATN  ID: cipro 8/3 once, vanc 8/3- 8/5. Flagyl 8/3-8/7, aztreonam  8/3-8/7, PO levofloxacin for a total of 3 weeks  DVT Proph: SCD's Code status: Full Code  Dispo: going to HD   LOS: 8 days    Jill Alexanders , [facility] 07/21/2016, 2:14 PM Pager: [phone] Consults: [phone] Mon-Fri 7:00 am-4:30 pm Sat-Sun 7:00 am-11:30 am

## 2016-07-21 NOTE — Progress Notes (Signed)
PT Cancellation Note  Patient Details Name: Thomas [name] MRN: [id number] DOB: [birth year]   Cancelled Treatment:    Reason Eval/Treat Not Completed: Medical issues which prohibited therapy. Patient with N/V and resting now.  Family asked that PT return later.  Will return later today for PT session.   Despina Pole 07/21/2016, 11:27 AM Carita Pian. Sanjuana Kava, [place] Pager [phone]

## 2016-07-21 NOTE — Progress Notes (Signed)
Pharmacy Antibiotic Note Thomas [name] is a 70 y.o. male with multiple myeloma admitted on 07/13/2016 with sepsis w/ gallbladder as suspected source. Diagnosed with  acute cholecystitis.  S/p  percutaneous cholecystostomy drain on 07/17/16.  Metronidazole DC'd 8/7. /3 Blood Cx:1/2 klebsiella pneumoniae, sensitive to Imipenem (pan sensitive except resistant to ampicillin and unasyn, ESBL negative). Patient allergies reviewed. ID pharmacist- recommended oral levofloxacin for klebsiella bacteremia. Wound cultures also sensitive to flouroquinolones. Patient remains afebrile, WBC stable WNL  Patient has ESRD on HD Tues, Thurs, Sat.-  Last HD done Sat 8/10.  Patient is producing urine at 0.23m/kg/day Scr 2.56  Plan: Continue Levofloxacin 500 mg po q48h -monitor clinical progress, cultures, for renal function improvement.   Height: _0  (185.4 cm) Weight: 160 lb 15 oz (73 kg) IBW/kg (Calculated) : 79.9  Temp (24hrs), Avg:98.1 F (36.7 C), Min:97.6 F (36.4 C), Max:98.4 F (36.9 C)   Recent Labs Lab 07/17/16 0504 07/17/16 1429 07/18/16 0531 07/19/16 0535 07/20/16 0500 07/21/16 0503  WBC 10.2 8.7 5.7 4.8 6.1 8.1  CREATININE 3.02*  --  3.39* 3.51* 3.77* 2.56*    Estimated Creatinine Clearance: 28.5 mL/min (by C-G formula based on SCr of 2.56 mg/dL).    Allergies  Allergen Reactions  . Penicillins Rash and Other (See Comments)    Has patient had a PCN reaction causing immediate rash, facial/tongue/throat swelling, SOB or lightheadedness with hypotension: YES + Reaction causing SEVERE RASH involving MUCUS MEMBRANES or SKIN NECROSIS >> YES Reaction that required hospitalization: NO Reaction occurring within the last 10 years: NO If all of the above answers are "NO", then may proceed with Cephalosporin use.   . Dilaudid [Hydromorphone Hcl] Other (See Comments)    hallucinations  . Fish Allergy Nausea And Vomiting  . Heparin     HIT panel/SRA pending  . Codeine Other (See Comments)   "MAKES ME JUMPY"    Antimicrobials this admission: 8/3 Azactam >>8/8 8/3 Flagyl >>8/7 8/3 Vancomycin >>8/5 8/3 Fluconazole >> 8/3 8/4 Levaquin >>  8/3 Cipro x 1>>8/3  Dose adjustments this admission: Aztreonam doses reduced to reflect ESRD, HD schedule (to give dose after HD on HD days).   Microbiology results: 8/3 BCx:1/2 kleb pna:  PAN sensitive except Resistant to Amp, Unasyn;  ESBL negative 8/3 MRSA PCR: neg 8/7 Wound: ENTEROBACTER CLOACAE/ KLEBSIELLA PNEUMONIAE   Thank you for allowing pharmacy to be a part of this patient's care.  TGeorga Bora PharmD Clinical Pharmacist Pager: [phone]/10/2016 10:28 AM

## 2016-07-21 NOTE — Progress Notes (Signed)
PT Cancellation Note  Patient Details Name: Thomas [name] MRN: [id number] DOB: 07/26/1946   Cancelled Treatment:    Reason Eval/Treat Not Completed: Patient declined, no reason specified. Family declined PT again this evening.  Patient alert and declined as well stating "I don't want to go to HD feeling bad"  Provided max encouragement, explaining benefits of mobility/therapy.  Family continued to decline.  Will return at later time.   Despina Pole 07/21/2016, 4:00 PM Carita Pian. Sanjuana Kava, [place] Pager [phone]

## 2016-07-21 NOTE — Progress Notes (Signed)
Subjective:  Feels really bad overall but feels better today than yesterday  Objective Vital signs in last 24 hours: Vitals:   07/20/16 1711 07/20/16 1952 07/21/16 0423 07/21/16 0955  BP: 118/64 136/64 111/79 132/65  Pulse: 94 (!) 53 91 93  Resp: _0 Temp:  98.3 F (36.8 C) 97.6 F (36.4 C)   TempSrc:  Oral Oral   SpO2:  93% 100% 99%  Weight: 73 kg (160 lb 15 oz)     Height:       Weight change: -0.036 kg (-1.3 oz)  Physical Exam: General:  Thin elderly WM OX3 alert, NAD Heart: RRR, no rub Lungs:  CTA  Bilat / non labored breathing  Abdomen: soft,sl  tender/ Non distended/ Right Upper Quad Drain brownish  liquid fluid collection   Extremities:  1-2+ dependent hip edema L> R Left only (ho L lower extrem frac  With rod)/ No asterixis  Dialysis Access: R IJ  PC and left AVF pos bruit ( placed 6/14)   Dialysis: TTS SW 4h 73kg 3K/ 2.25 bath Hep 7300 R IJ cath/ maturing L BCF Aranesp 40 g weekly     pth 133 no vit d   Assessment: 1.Abdominal pain/nausea/vomiting: cholecystitis-sp  IR  perc chole drainage 8/07  Blood Cx:1/2 klebsiellapneumoniae.  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 Not feeling well at all w trial of no dialysis, starting back on HD w HD again today, tomorrow and then TTS.   3. Anemia: Secondary to ESRD/multiple myeloma-max ESA  4.Multiple myeloma with bony metastasis:On chemotherapy/radiation to left femur-further management per Dr. Alvy Bimler - noted yest "Serum light chain has dropped significantly to near normal ratio/ Due to recent pancytopenia, treatment is placed on hold. FU Rx next week in clinic" 5. CKD-MBD:.  pth was  133 as op and no vit d needed. Last phos 4.0  07/15/16 and no binder needed as op  With all phos less than 4.0 on op labs /  Fu am renal labs  6. HTN/vol- mild vol excess on exam, at dry wt, on midodrine 10 tid   Plan - HD today and then tomorrow, resume full  HD routine.    [name] MD [facility] pager [phone]    cell [phone] 07/21/2016, 12:20 PM         Labs: Basic Metabolic Panel:  Recent Labs Lab 07/15/16 0222  07/19/16 0535 07/20/16 0500 07/21/16 0503  NA 128*  < > 135 134* 136  K 3.6  < > 3.8 3.6 4.1  CL 98*  < > 101 99* 98*  CO2 20*  < > _1 GLUCOSE 153*  < > 144* 164* 157*  BUN 51*  < > 52* 58* 31*  CREATININE 4.26*  < > 3.51* 3.77* 2.56*  CALCIUM 7.3*  < > 7.7* 7.8* 7.8*  PHOS 4.0  --  3.9  --   --   < > = values in this interval not displayed. Liver Function Tests:  Recent Labs Lab 07/19/16 0535 07/20/16 0500 07/21/16 0503  AST _2 ALT 15* 15* 16*  ALKPHOS 206* 208* 199*  BILITOT 2.3* 1.8* 1.8*  PROT 4.8* 4.9* 4.9*  ALBUMIN 1.7* 1.6* 1.6*   No results for input(s): LIPASE, AMYLASE in the last 168 hours. No results for input(s): AMMONIA in the last 168 hours. CBC:  Recent Labs Lab 07/17/16 1429 07/18/16 0531 07/19/16 0535 07/20/16  0500 07/21/16 0503  WBC 8.7 5.7 4.8 6.1 8.1  NEUTROABS  --  4.5 3.7 5.1  --   HGB 8.0* 8.1* 8.3* 8.0* 7.7*  HCT 24.0* 24.9* 25.5* 24.2* 23.6*  MCV 88.9 90.9 91.4 90.0 92.9  PLT 43* 55* 52* 61* 75*   Cardiac Enzymes: No results for input(s): CKTOTAL, CKMB, CKMBINDEX, TROPONINI in the last 168 hours. CBG: No results for input(s): GLUCAP in the last 168 hours.  Studies/Results: No results found. Medications:   . darbepoetin (ARANESP) injection - DIALYSIS  200 mcg Intravenous Q Sat-HD  . famotidine  20 mg Oral Daily  . feeding supplement (ENSURE ENLIVE)  237 mL Oral BID BM  . levofloxacin  500 mg Oral Q48H  . midodrine  10 mg Oral TID WC  . multivitamin  1 tablet Oral QHS  . protein supplement  1 scoop Oral TID WC  . vitamin B-12  1,000 mcg Oral Daily  . Vitamin D (Ergocalciferol)  50,000 Units Oral Q7 days

## 2016-07-21 NOTE — Progress Notes (Signed)
24 Rn roundking on pt after being request to leave pt sleeping by family at [facility] during 0730 rounding, pt obs to be tearful c/o of 7/10 pain, pt requesting pain medications, Md updated, pt ordered IV pain medication, this RN returned to pt room with IV pain meds after being verified and approved my Lisa/ PharmD with current allergies, family refuses for pt to take pain meds IV at that time, RN left room to update MD, family followed this RN and communicated with MD concern for pt and pain meds, lower dose pain meds given to pt see MAR, this RN charted at [facility] to obs pt and concern from family concerning pain meds.  0940- pt obs to be calm and sleeping, nursing assessment complete, nursing will cont to monitor

## 2016-07-22 LAB — BASIC METABOLIC PANEL
ANION GAP: 9 (ref 5–15)
BUN: 21 mg/dL — ABNORMAL HIGH (ref 6–20)
CALCIUM: 7.7 mg/dL — AB (ref 8.9–10.3)
CO2: 29 mmol/L (ref 22–32)
Chloride: 97 mmol/L — ABNORMAL LOW (ref 101–111)
Creatinine, Ser: 1.9 mg/dL — ABNORMAL HIGH (ref 0.61–1.24)
GFR, EST AFRICAN AMERICAN: 40 mL/min — AB (ref >60)
GFR, EST NON AFRICAN AMERICAN: 35 mL/min — AB (ref >60)
Glucose, Bld: 153 mg/dL — ABNORMAL HIGH (ref 65–99)
POTASSIUM: 3.8 mmol/L (ref 3.5–5.1)
SODIUM: 135 mmol/L (ref 135–145)

## 2016-07-22 LAB — CBC
HCT: 23.3 % — ABNORMAL LOW (ref 39.0–52.0)
Hemoglobin: 7.5 g/dL — ABNORMAL LOW (ref 13.0–17.0)
MCH: 29.8 pg (ref 26.0–34.0)
MCHC: 32.2 g/dL (ref 30.0–36.0)
MCV: 92.5 fL (ref 78.0–100.0)
PLATELETS: 84 10*3/uL — AB (ref 150–400)
RBC: 2.52 MIL/uL — AB (ref 4.22–5.81)
RDW: 15.7 % — AB (ref 11.5–15.5)
WBC: 8.3 10*3/uL (ref 4.0–10.5)

## 2016-07-22 LAB — AEROBIC/ANAEROBIC CULTURE (SURGICAL/DEEP WOUND): SPECIAL REQUESTS: NORMAL

## 2016-07-22 LAB — AEROBIC/ANAEROBIC CULTURE W GRAM STAIN (SURGICAL/DEEP WOUND)

## 2016-07-22 MED ORDER — MIDODRINE HCL 5 MG PO TABS
ORAL_TABLET | ORAL | Status: AC
  Administered 2016-07-22: 10 mg
  Filled 2016-07-22: qty 2

## 2016-07-22 MED ORDER — DARBEPOETIN ALFA 200 MCG/0.4ML IJ SOSY
PREFILLED_SYRINGE | INTRAMUSCULAR | Status: AC
  Filled 2016-07-22: qty 0.4

## 2016-07-22 NOTE — Progress Notes (Signed)
Subjective:  Feels really bad overall but feels better today than yesterday  Objective Vital signs in last 24 hours: Vitals:   07/22/16 0800 07/22/16 0830 07/22/16 0900 07/22/16 0930  BP: 100/65 108/64 (!) 97/55 94/60  Pulse: 87 91 96 89  Resp:      Temp:      TempSrc:      SpO2:      Weight:      Height:       Weight change: -0.2 kg (-7.1 oz)  Physical Exam: General:  Thin elderly WM OX3 alert, NAD Heart: RRR, no rub Lungs:  CTA  Bilat / non labored breathing  Abdomen: soft,sl  tender/ Non distended/ Right Upper Quad Drain brownish  liquid fluid collection   Extremities:  1-2+ dependent hip edema L> R Left only (ho L lower extrem frac  With rod)/ No asterixis  Dialysis Access: R IJ  PC and left AVF (05/24/16, Dr Bridgett Larsson)  Dialysis: TTS SW 4h 73kg 3K/ 2.25 bath Hep 7300 R IJ cath/ maturing L BCF (05/24/16) Aranesp 40 g weekly     pth 133 no vit d   Assessment: 1.Abdominal pain/nausea/vomiting: cholecystitis-sp  IR  perc chole drainage 8/07  Blood Cx:1/2 klebsiellapneumoniae.  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 Failed trial of holding dialysis, much better now back on HD. Cont TTS.  3. Anemia: Secondary to ESRD/multiple myeloma-max ESA  4.Multiple myeloma:on chemotherapy/radiation to left femur-further management per Dr. 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 Last phos 4.0  07/15/16 and no binder needed as op  With all phos less than 4.0 on op labs /  Fu am renal labs  6.  Volume - low bp's on midodrine, 2kg under dry wt   Plan - resuming TTS HD, vol good   [name] MD [facility] pager [phone]    cell [phone] 07/22/2016, 10:00 AM         Labs: Basic Metabolic Panel:  Recent Labs Lab 07/19/16 0535 07/20/16 0500 07/21/16 0503 07/22/16 0603  NA 135 134* 136 135  K 3.8 3.6 4.1 3.8  CL 101 99* 98* 97*  CO2 _0 GLUCOSE 144* 164*  157* 153*  BUN 52* 58* 31* 21*  CREATININE 3.51* 3.77* 2.56* 1.90*  CALCIUM 7.7* 7.8* 7.8* 7.7*  PHOS 3.9  --   --   --    Liver Function Tests:  Recent Labs Lab 07/19/16 0535 07/20/16 0500 07/21/16 0503  AST _1 ALT 15* 15* 16*  ALKPHOS 206* 208* 199*  BILITOT 2.3* 1.8* 1.8*  PROT 4.8* 4.9* 4.9*  ALBUMIN 1.7* 1.6* 1.6*   No results for input(s): LIPASE, AMYLASE in the last 168 hours. No results for input(s): AMMONIA in the last 168 hours. CBC:  Recent Labs Lab 07/18/16 0531 07/19/16 0535 07/20/16 0500 07/21/16 0503 07/22/16 0603  WBC 5.7 4.8 6.1 8.1 8.3  NEUTROABS 4.5 3.7 5.1  --   --   HGB 8.1* 8.3* 8.0* 7.7* 7.5*  HCT 24.9* 25.5* 24.2* 23.6* 23.3*  MCV 90.9 91.4 90.0 92.9 92.5  PLT 55* 52* 61* 75* 84*   Cardiac Enzymes: No results for input(s): CKTOTAL, CKMB, CKMBINDEX, TROPONINI in the last 168 hours. CBG: No results for input(s): GLUCAP in the last 168 hours.  Studies/Results: No results found. Medications:   . darbepoetin (ARANESP) injection - DIALYSIS  200 mcg Intravenous Q Sat-HD  .  famotidine  20 mg Oral Daily  . feeding supplement (ENSURE ENLIVE)  237 mL Oral BID BM  . levofloxacin  500 mg Oral Q48H  . midodrine  10 mg Oral TID WC  . multivitamin  1 tablet Oral QHS  . protein supplement  1 scoop Oral TID WC  . vitamin B-12  1,000 mcg Oral Daily  . Vitamin D (Ergocalciferol)  50,000 Units Oral Q7 days

## 2016-07-22 NOTE — Progress Notes (Signed)
Subjective: Still reporting some RUQ abdominal pain, but less Appetite [name] little improved.   Objective: Vital signs in last 24 hours: Temp:  [97.4 F (36.3 C)-98.3 F (36.8 C)] 97.7 F (36.5 C) (08/12 0725) Pulse Rate:  [82-97] 89 (08/12 0930) Resp:  [19-22] 21 (08/12 0725) BP: (90-132)/(54-66) 94/60 (08/12 0930) SpO2:  [94 %-99 %] 96 % (08/12 0725) Weight:  [70.6 kg (155 lb 10.3 oz)-73.7 kg (162 lb 7.7 oz)] 70.6 kg (155 lb 10.3 oz) (08/12 0725) Last BM Date: 07/20/16  Intake/Output from previous day: 08/11 0701 - 08/12 0700 In: 360 [P.O.:360] Out: 1980 [Urine:950; Drains:30] Intake/Output this shift: No intake/output data recorded.  Exam: Abdomen soft with minimal RUQ tenderness.  Rest of abdomen almost non tender Perc drain with bile  Lab Results:   Recent Labs  07/21/16 0503 07/22/16 0603  WBC 8.1 8.3  HGB 7.7* 7.5*  HCT 23.6* 23.3*  PLT 75* 84*   BMET  Recent Labs  07/21/16 0503 07/22/16 0603  NA 136 135  K 4.1 3.8  CL 98* 97*  CO2 28 29  GLUCOSE 157* 153*  BUN 31* 21*  CREATININE 2.56* 1.90*  CALCIUM 7.8* 7.7*   PT/INR No results for input(s): LABPROT, INR in the last 72 hours. ABG No results for input(s): PHART, HCO3 in the last 72 hours.  Invalid input(s): PCO2, PO2  Studies/Results: No results found.  Anti-infectives: Anti-infectives    Start     Dose/Rate Route Frequency Ordered Stop   07/20/16 1200  levofloxacin (LEVAQUIN) tablet 500 mg     500 mg Oral Every 48 hours 07/18/16 1113     07/18/16 1200  levofloxacin (LEVAQUIN) tablet 750 mg     750 mg Oral  Once 07/18/16 1113 07/18/16 1309   07/17/16 1800  vancomycin (VANCOCIN) IVPB 1000 mg/200 mL premix  Status:  Discontinued     1,000 mg 200 mL/hr over 60 Minutes Intravenous To Radiology 07/17/16 1701 07/18/16 1108   07/15/16 1800  aztreonam (AZACTAM) 1 g in dextrose 5 % 50 mL IVPB  Status:  Discontinued     1 g 100 mL/hr over 30 Minutes Intravenous Every 24 hours 07/14/16 1232  07/18/16 1108   07/15/16 1200  vancomycin (VANCOCIN) IVPB 750 mg/150 ml premix  Status:  Discontinued     750 mg 150 mL/hr over 60 Minutes Intravenous Every T-Th-Sa (Hemodialysis) 07/14/16 1232 07/15/16 1445   07/14/16 1900  vancomycin (VANCOCIN) IVPB 750 mg/150 ml premix  Status:  Discontinued     750 mg 150 mL/hr over 60 Minutes Intravenous Every 24 hours 07/13/16 1740 07/14/16 1232   07/14/16 1800  fluconazole (DIFLUCAN) IVPB 200 mg  Status:  Discontinued     200 mg 100 mL/hr over 60 Minutes Intravenous Every 24 hours 07/13/16 1701 07/14/16 0949   07/14/16 1500  levofloxacin (LEVAQUIN) IVPB 500 mg  Status:  Discontinued     500 mg 100 mL/hr over 60 Minutes Intravenous Every 48 hours 07/13/16 1731 07/14/16 0949   07/13/16 2200  metroNIDAZOLE (FLAGYL) IVPB 500 mg  Status:  Discontinued     500 mg 100 mL/hr over 60 Minutes Intravenous Every 8 hours 07/13/16 1732 07/17/16 1409   07/13/16 1745  aztreonam (AZACTAM) 1 g in dextrose 5 % 50 mL IVPB  Status:  Discontinued     1 g 100 mL/hr over 30 Minutes Intravenous Every 8 hours 07/13/16 1731 07/14/16 1232   07/13/16 1730  fluconazole (DIFLUCAN) IVPB 400 mg     400 mg  100 mL/hr over 120 Minutes Intravenous  Once 07/13/16 1701 07/13/16 2251   07/13/16 1700  vancomycin (VANCOCIN) 1,500 mg in sodium chloride 0.9 % 500 mL IVPB     1,500 mg 250 mL/hr over 120 Minutes Intravenous  Once 07/13/16 1651 07/13/16 1951   07/13/16 1445  ciprofloxacin (CIPRO) IVPB 400 mg     400 mg 200 mL/hr over 60 Minutes Intravenous  Once 07/13/16 1434 07/13/16 1555   07/13/16 1445  metroNIDAZOLE (FLAGYL) IVPB 500 mg     500 mg 100 mL/hr over 60 Minutes Intravenous  Once 07/13/16 1434 07/13/16 1721      Assessment/Plan: Cholecystitis s/p perc drain by IR and mild colitis  Continue drain, antibiotics. Diet as tolerated  LOS: 9 days    Thomas [name] 07/22/2016

## 2016-07-22 NOTE — Progress Notes (Signed)
PROGRESS NOTE    Thomas [name]  LSL:[id number] DOB: 1946/02/25 DOA: 07/13/2016 PCP: No PCP Per Patient   Chief Complaint  Patient presents with  . Abdominal Pain  . abnormal labs  . Leg Pain    Brief Narrative:   Assessment & Plan   Hypotension -Patient did require vasopressors while in the ICU -Resolved, currently on midodrine   Sepsis secondary to Klebsiella pneumoniae bacteremia -blood cultures from 07/13/2016 +kleb pneumoniae -MRI/MRCP showed findings c/w cholecystitis, no stones, no CBD stones along with asc and des colitis -Patient evaluated by general surgery initially, for abnormal liver function, possible CBD stone. -Presentation was concerning for acalculous cholecystitis. Surgery recommended percutaneous drain which was placed by IR on 07/17/2016 -Sepsis physiology improving -vancomycin discontinued 8/5, also discontinued Flagyl and aztreonam -Transitioned to levofloxacin for a total of 3 weeks for Klebsiella pneumonia bacteremia   Anemia/Thrombocytopenia/Multiple myeloma -in the setting of multiple myeloma, received 2 units of packed red blood cell transfusions prior to IR procedure -Oncology consulted and appreciated -Serum light chain has dropped significantly to near normal ratio -Due to recent pancytopenia, treatment is placed on hold -Pancytopenia has improved since discontinuation of Revlimid.  HIT panel negative  -Anemia is related to chronic renal failure. He is getting ESA through dialysis center -Previous hospitalist discussed with Thomas Lark, MD (onc), no further platelet transfusions indicated at this time -Today Hemoglobin 7.5, platelets 84  Transaminitis/cholecystitis -Improving, passed biliary stone, s/p Percutaneous cholecystostomy drain placed 8/7 in IR -Gastroenterology, IR, general surgery consulted and appreciated -Drain to remain 6-8 weeks -Continue pain control PRN  End-stage renal disease -Secondary to multiple myeloma and getting  hemodialysis via right IJ tunneled dialysis catheter -Nephrology consulted and appreciated -Patient currently dialyzing  -Creatinine today 1.90 -Per nephrology, patient to resume HD TTS   Multiple myeloma -On chemotherapy/radiation to left femur-further management per hematology -Due to recent pancytopenia, treatment is placed on hold -Continue aggressive supportive care -He has appointment to follow-up in the oncology clinic next week on 07/17/2016  Goals of care -Discussed possible palliative care consult with patient and family- for pain and symptom management, they declined at this time.  DVT Prophylaxis  SCDs  Code Status: Full  Family Communication: Wife at bedside  Disposition Plan: Admitted  Consultants Nephrology  General surgery Interventional radiology PCCM  Procedures  Abdominal US Percutaneous cholecystostomy drain placement  Antibiotics   Anti-infectives    Start     Dose/Rate Route Frequency Ordered Stop   07/20/16 1200  levofloxacin (LEVAQUIN) tablet 500 mg     500 mg Oral Every 48 hours 07/18/16 1113     07/18/16 1200  levofloxacin (LEVAQUIN) tablet 750 mg     750 mg Oral  Once 07/18/16 1113 07/18/16 1309   07/17/16 1800  vancomycin (VANCOCIN) IVPB 1000 mg/200 mL premix  Status:  Discontinued     1,000 mg 200 mL/hr over 60 Minutes Intravenous To Radiology 07/17/16 1701 07/18/16 1108   07/15/16 1800  aztreonam (AZACTAM) 1 g in dextrose 5 % 50 mL IVPB  Status:  Discontinued     1 g 100 mL/hr over 30 Minutes Intravenous Every 24 hours 07/14/16 1232 07/18/16 1108   07/15/16 1200  vancomycin (VANCOCIN) IVPB 750 mg/150 ml premix  Status:  Discontinued     750 mg 150 mL/hr over 60 Minutes Intravenous Every T-Th-Sa (Hemodialysis) 07/14/16 1232 07/15/16 1445   07/14/16 1900  vancomycin (VANCOCIN) IVPB 750 mg/150 ml premix  Status:  Discontinued     750 mg 150  mL/hr over 60 Minutes Intravenous Every 24 hours 07/13/16 1740 07/14/16 1232   07/14/16 1800   fluconazole (DIFLUCAN) IVPB 200 mg  Status:  Discontinued     200 mg 100 mL/hr over 60 Minutes Intravenous Every 24 hours 07/13/16 1701 07/14/16 0949   07/14/16 1500  levofloxacin (LEVAQUIN) IVPB 500 mg  Status:  Discontinued     500 mg 100 mL/hr over 60 Minutes Intravenous Every 48 hours 07/13/16 1731 07/14/16 0949   07/13/16 2200  metroNIDAZOLE (FLAGYL) IVPB 500 mg  Status:  Discontinued     500 mg 100 mL/hr over 60 Minutes Intravenous Every 8 hours 07/13/16 1732 07/17/16 1409   07/13/16 1745  aztreonam (AZACTAM) 1 g in dextrose 5 % 50 mL IVPB  Status:  Discontinued     1 g 100 mL/hr over 30 Minutes Intravenous Every 8 hours 07/13/16 1731 07/14/16 1232   07/13/16 1730  fluconazole (DIFLUCAN) IVPB 400 mg     400 mg 100 mL/hr over 120 Minutes Intravenous  Once 07/13/16 1701 07/13/16 2251   07/13/16 1700  vancomycin (VANCOCIN) 1,500 mg in sodium chloride 0.9 % 500 mL IVPB     1,500 mg 250 mL/hr over 120 Minutes Intravenous  Once 07/13/16 1651 07/13/16 1951   07/13/16 1445  ciprofloxacin (CIPRO) IVPB 400 mg     400 mg 200 mL/hr over 60 Minutes Intravenous  Once 07/13/16 1434 07/13/16 1555   07/13/16 1445  metroNIDAZOLE (FLAGYL) IVPB 500 mg     500 mg 100 mL/hr over 60 Minutes Intravenous  Once 07/13/16 1434 07/13/16 1721      Subjective:   [name] seen and examined today. Patient states he is feeling better than yesterday.  Feels the pain medicine has helped, more so the oral form.  Denies chest pain, shortness of breath, dizziness, headache. Feels appetite is better.  Objective:   Vitals:   07/22/16 0930 07/22/16 1000 07/22/16 1030 07/22/16 1100  BP: 94/60 114/64 106/69 113/66  Pulse: 89 92 94 (!) 31  Resp:      Temp:      TempSrc:      SpO2:      Weight:      Height:        Intake/Output Summary (Last 24 hours) at 07/22/16 1125 Last data filed at 07/22/16 0700  Gross per 24 hour  Intake              120 ml  Output             1780 ml  Net            -1660 ml    Filed Weights   07/21/16 1815 07/21/16 2111 07/22/16 0725  Weight: 72.5 kg (159 lb 13.3 oz) 72 kg (158 lb 12.8 oz) 70.6 kg (155 lb 10.3 oz)    Exam  General: Well developed, elderly, chronically ill appearing  HEENT: NCAT,  mucous membranes moist.   Cardiovascular: S1 S2 auscultated, no murmurs appreciated, RRR  Respiratory: Clear to auscultation bilaterally with equal chest rise  Abdomen: Soft, RUQ TTP/drain in place, nondistended, + bowel sounds  Extremities: warm dry without cyanosis clubbing. +LE edema  Neuro: AAOx3, nonfocal  Psych: Normal affect and demeanor with intact judgement and insight, pleasant  Data Reviewed: I have personally reviewed following labs and imaging studies  CBC:  Recent Labs Lab 07/16/16 0744  07/18/16 0531 07/19/16 0535 07/20/16 0500 07/21/16 0503 07/22/16 0603  WBC 15.6*  < > 5.7 4.8 6.1 8.1  8.3  NEUTROABS 14.8*  --  4.5 3.7 5.1  --   --   HGB 8.2*  < > 8.1* 8.3* 8.0* 7.7* 7.5*  HCT 24.3*  < > 24.9* 25.5* 24.2* 23.6* 23.3*  MCV 88.0  < > 90.9 91.4 90.0 92.9 92.5  PLT 27*  < > 55* 52* 61* 75* 84*  < > = values in this interval not displayed. Basic Metabolic Panel:  Recent Labs Lab 07/18/16 0531 07/19/16 0535 07/20/16 0500 07/21/16 0503 07/22/16 0603  NA 133* 135 134* 136 135  K 3.5 3.8 3.6 4.1 3.8  CL 100* 101 99* 98* 97*  CO2 25 25 25 28 29   GLUCOSE 141* 144* 164* 157* 153*  BUN 45* 52* 58* 31* 21*  CREATININE 3.39* 3.51* 3.77* 2.56* 1.90*  CALCIUM 7.6* 7.7* 7.8* 7.8* 7.7*  PHOS  --  3.9  --   --   --    GFR: Estimated Creatinine Clearance: 37.2 mL/min (by C-G formula based on SCr of 1.9 mg/dL). Liver Function Tests:  Recent Labs Lab 07/17/16 0504 07/18/16 0531 07/19/16 0535 07/20/16 0500 07/21/16 0503  AST 20 17 17 21 28   ALT 25 17 15* 15* 16*  ALKPHOS 295* 255* 206* 208* 199*  BILITOT 3.5* 3.1* 2.3* 1.8* 1.8*  PROT 4.7* 4.8* 4.8* 4.9* 4.9*  ALBUMIN 1.7* 1.7* 1.7* 1.6* 1.6*   No results for input(s):  LIPASE, AMYLASE in the last 168 hours. No results for input(s): AMMONIA in the last 168 hours. Coagulation Profile:  Recent Labs Lab 07/17/16 0932  INR 1.65   Cardiac Enzymes: No results for input(s): CKTOTAL, CKMB, CKMBINDEX, TROPONINI in the last 168 hours. BNP (last 3 results) No results for input(s): PROBNP in the last 8760 hours. HbA1C: No results for input(s): HGBA1C in the last 72 hours. CBG: No results for input(s): GLUCAP in the last 168 hours. Lipid Profile: No results for input(s): CHOL, HDL, LDLCALC, TRIG, CHOLHDL, LDLDIRECT in the last 72 hours. Thyroid Function Tests: No results for input(s): TSH, T4TOTAL, FREET4, T3FREE, THYROIDAB in the last 72 hours. Anemia Panel: No results for input(s): VITAMINB12, FOLATE, FERRITIN, TIBC, IRON, RETICCTPCT in the last 72 hours. Urine analysis:    Component Value Date/Time   COLORURINE YELLOW 05/21/2016 1531   APPEARANCEUR CLOUDY (A) 05/21/2016 1531   LABSPEC 1.008 05/21/2016 1531   PHURINE 7.5 05/21/2016 1531   GLUCOSEU NEGATIVE 05/21/2016 1531   HGBUR LARGE (A) 05/21/2016 1531   BILIRUBINUR NEGATIVE 05/21/2016 1531   KETONESUR NEGATIVE 05/21/2016 1531   PROTEINUR 100 (A) 05/21/2016 1531   NITRITE NEGATIVE 05/21/2016 1531   LEUKOCYTESUR NEGATIVE 05/21/2016 1531   Sepsis Labs: @LABRCNTIP (procalcitonin:4,lacticidven:4)  ) Recent Results (from the past 240 hour(s))  Culture, blood (routine x 2)     Status: None   Collection Time: 07/13/16  4:20 PM  Result Value Ref Range Status   Specimen Description BLOOD RIGHT WRIST  Final   Special Requests BOTTLES DRAWN AEROBIC AND ANAEROBIC 5CC  Final   Culture NO GROWTH 5 DAYS  Final   Report Status 07/18/2016 FINAL  Final  Culture, blood (routine x 2)     Status: Abnormal   Collection Time: 07/13/16  5:04 PM  Result Value Ref Range Status   Specimen Description BLOOD RIGHT HAND BAER  Final   Special Requests 3CC  Final   Culture  Setup Time   Final    GRAM NEGATIVE  RODS AEROBIC BOTTLE ONLY CRITICAL RESULT CALLED TO, READ BACK BY AND VERIFIED WITH:  M MACCIA,PHARMD AT 1006 07/15/16 BY L BENFIELD    Culture KLEBSIELLA PNEUMONIAE (A)  Final   Report Status 07/17/2016 FINAL  Final   Organism ID, Bacteria KLEBSIELLA PNEUMONIAE  Final      Susceptibility   Klebsiella pneumoniae - MIC*    AMPICILLIN >=32 RESISTANT Resistant     CEFAZOLIN <=4 SENSITIVE Sensitive     CEFEPIME <=1 SENSITIVE Sensitive     CEFTAZIDIME <=1 SENSITIVE Sensitive     CEFTRIAXONE <=1 SENSITIVE Sensitive     CIPROFLOXACIN 1 SENSITIVE Sensitive     GENTAMICIN <=1 SENSITIVE Sensitive     IMIPENEM <=0.25 SENSITIVE Sensitive     TRIMETH/SULFA 40 SENSITIVE Sensitive     AMPICILLIN/SULBACTAM >=32 RESISTANT Resistant     PIP/TAZO 16 SENSITIVE Sensitive     Extended ESBL NEGATIVE Sensitive     * KLEBSIELLA PNEUMONIAE  Blood Culture ID Panel (Reflexed)     Status: Abnormal   Collection Time: 07/13/16  5:04 PM  Result Value Ref Range Status   Enterococcus species NOT DETECTED NOT DETECTED Final   Vancomycin resistance NOT DETECTED NOT DETECTED Final   Listeria monocytogenes NOT DETECTED NOT DETECTED Final   Staphylococcus species NOT DETECTED NOT DETECTED Final   Staphylococcus aureus NOT DETECTED NOT DETECTED Final   Methicillin resistance NOT DETECTED NOT DETECTED Final   Streptococcus species NOT DETECTED NOT DETECTED Final   Streptococcus agalactiae NOT DETECTED NOT DETECTED Final   Streptococcus pneumoniae NOT DETECTED NOT DETECTED Final   Streptococcus pyogenes NOT DETECTED NOT DETECTED Final   Acinetobacter baumannii NOT DETECTED NOT DETECTED Final   Enterobacteriaceae species DETECTED (A) NOT DETECTED Final    Comment: CRITICAL RESULT CALLED TO, READ BACK BY AND VERIFIED WITH: M MACCIA,PHARMD AT 1006 07/15/16 BY L BENFIELD    Enterobacter cloacae complex NOT DETECTED NOT DETECTED Final   Escherichia coli NOT DETECTED NOT DETECTED Final   Klebsiella oxytoca NOT DETECTED NOT  DETECTED Final   Klebsiella pneumoniae DETECTED (A) NOT DETECTED Final    Comment: CRITICAL RESULT CALLED TO, READ BACK BY AND VERIFIED WITH: M MACCIA,PHARMD AT 1006 07/15/16 BY L BENFIELD    Proteus species NOT DETECTED NOT DETECTED Final   Serratia marcescens NOT DETECTED NOT DETECTED Final   Carbapenem resistance NOT DETECTED NOT DETECTED Final   Haemophilus influenzae NOT DETECTED NOT DETECTED Final   Neisseria meningitidis NOT DETECTED NOT DETECTED Final   Pseudomonas aeruginosa NOT DETECTED NOT DETECTED Final   Candida albicans NOT DETECTED NOT DETECTED Final   Candida glabrata NOT DETECTED NOT DETECTED Final   Candida krusei NOT DETECTED NOT DETECTED Final   Candida parapsilosis NOT DETECTED NOT DETECTED Final   Candida tropicalis NOT DETECTED NOT DETECTED Final  MRSA PCR Screening     Status: None   Collection Time: 07/13/16  6:31 PM  Result Value Ref Range Status   MRSA by PCR NEGATIVE NEGATIVE Final    Comment:        The GeneXpert MRSA Assay (FDA approved for NASAL specimens only), is one component of a comprehensive MRSA colonization surveillance program. It is not intended to diagnose MRSA infection nor to guide or monitor treatment for MRSA infections.   Aerobic/Anaerobic Culture (surgical/deep wound)     Status: None (Preliminary result)   Collection Time: 07/17/16  5:18 PM  Result Value Ref Range Status   Specimen Description DRAINAGE BILE  Final   Special Requests Normal  Final   Gram Stain   Final    RARE WBC  PRESENT, PREDOMINANTLY MONONUCLEAR RARE GRAM NEGATIVE RODS    Culture   Final    MODERATE ENTEROBACTER CLOACAE MODERATE KLEBSIELLA PNEUMONIAE NO ANAEROBES ISOLATED; CULTURE IN PROGRESS FOR 5 DAYS    Report Status PENDING  Incomplete   Organism ID, Bacteria ENTEROBACTER CLOACAE  Final   Organism ID, Bacteria KLEBSIELLA PNEUMONIAE  Final      Susceptibility   Enterobacter cloacae - MIC*    CEFAZOLIN >=64 RESISTANT Resistant     CEFEPIME <=1  SENSITIVE Sensitive     CEFTAZIDIME <=1 SENSITIVE Sensitive     CEFTRIAXONE <=1 SENSITIVE Sensitive     CIPROFLOXACIN <=0.25 SENSITIVE Sensitive     GENTAMICIN <=1 SENSITIVE Sensitive     IMIPENEM 0.5 SENSITIVE Sensitive     TRIMETH/SULFA <=20 SENSITIVE Sensitive     PIP/TAZO <=4 SENSITIVE Sensitive     * MODERATE ENTEROBACTER CLOACAE   Klebsiella pneumoniae - MIC*    AMPICILLIN >=32 RESISTANT Resistant     CEFAZOLIN <=4 SENSITIVE Sensitive     CEFEPIME <=1 SENSITIVE Sensitive     CEFTAZIDIME <=1 SENSITIVE Sensitive     CEFTRIAXONE <=1 SENSITIVE Sensitive     CIPROFLOXACIN <=0.25 SENSITIVE Sensitive     GENTAMICIN <=1 SENSITIVE Sensitive     IMIPENEM <=0.25 SENSITIVE Sensitive     TRIMETH/SULFA <=20 SENSITIVE Sensitive     AMPICILLIN/SULBACTAM 4 SENSITIVE Sensitive     PIP/TAZO <=4 SENSITIVE Sensitive     Extended ESBL NEGATIVE Sensitive     * MODERATE KLEBSIELLA PNEUMONIAE      Radiology Studies: No results found.   Scheduled Meds: . Darbepoetin Alfa      . darbepoetin (ARANESP) injection - DIALYSIS  200 mcg Intravenous Q Sat-HD  . famotidine  20 mg Oral Daily  . feeding supplement (ENSURE ENLIVE)  237 mL Oral BID BM  . levofloxacin  500 mg Oral Q48H  . midodrine  10 mg Oral TID WC  . multivitamin  1 tablet Oral QHS  . protein supplement  1 scoop Oral TID WC  . vitamin B-12  1,000 mcg Oral Daily  . Vitamin D (Ergocalciferol)  50,000 Units Oral Q7 days   Continuous Infusions:    LOS: 9 days   Time Spent in minutes   30 minutes  Riane Rung D.O. on 07/22/2016 at 11:25 AM  Between 7am to 7pm - Pager - [phone]  After 7pm go to [email or web address] - password [email or web address]  And look for the night coverage person covering for me after hours  [facility] Office  [phone]

## 2016-07-23 LAB — CBC
HEMATOCRIT: 24.2 % — AB (ref 39.0–52.0)
HEMOGLOBIN: 7.6 g/dL — AB (ref 13.0–17.0)
MCH: 29.7 pg (ref 26.0–34.0)
MCHC: 31.4 g/dL (ref 30.0–36.0)
MCV: 94.5 fL (ref 78.0–100.0)
Platelets: 91 10*3/uL — ABNORMAL LOW (ref 150–400)
RBC: 2.56 MIL/uL — ABNORMAL LOW (ref 4.22–5.81)
RDW: 15.6 % — ABNORMAL HIGH (ref 11.5–15.5)
WBC: 9.8 10*3/uL (ref 4.0–10.5)

## 2016-07-23 LAB — BASIC METABOLIC PANEL
ANION GAP: 9 (ref 5–15)
BUN: 17 mg/dL (ref 6–20)
CO2: 29 mmol/L (ref 22–32)
Calcium: 7.9 mg/dL — ABNORMAL LOW (ref 8.9–10.3)
Chloride: 97 mmol/L — ABNORMAL LOW (ref 101–111)
Creatinine, Ser: 1.91 mg/dL — ABNORMAL HIGH (ref 0.61–1.24)
GFR calc Af Amer: 40 mL/min — ABNORMAL LOW (ref >60)
GFR calc non Af Amer: 34 mL/min — ABNORMAL LOW (ref >60)
GLUCOSE: 158 mg/dL — AB (ref 65–99)
POTASSIUM: 4.1 mmol/L (ref 3.5–5.1)
Sodium: 135 mmol/L (ref 135–145)

## 2016-07-23 MED ORDER — FLUTICASONE PROPIONATE 50 MCG/ACT NA SUSP
1.0000 | Freq: Two times a day (BID) | NASAL | Status: DC
  Administered 2016-07-23 – 2016-08-02 (×18): 1 via NASAL
  Filled 2016-07-23: qty 16

## 2016-07-23 NOTE — Progress Notes (Addendum)
Subjective:  Feeling better more andmore each day  Objective Vital signs in last 24 hours: Vitals:   07/22/16 1657 07/22/16 2107 07/23/16 0519 07/23/16 0805  BP: 104/63 119/68 128/62 (!) 114/56  Pulse: 94 94 94 83  Resp: (!) 24 20 18 18   Temp: 98.1 F (36.7 C) 97.8 F (36.6 C) 97.7 F (36.5 C) 98.1 F (36.7 C)  TempSrc: Axillary Oral Oral Oral  SpO2: 95% 94% 94% 94%  Weight:  69.9 kg (154 lb 1.6 oz)    Height:        Physical Exam: General:  Thin elderly WM OX3 alert, NAD Heart: RRR, no rub Lungs:  CTA  Bilat / non labored breathing  Abdomen: soft,sl  tender/ Non distended/ Right Upper Quad Drain brownish  liquid fluid collection   Extremities:  1+ dependent hip edema L> R Left only (ho L lower extrem frac  With rod)/ No asterixis  Dialysis Access: R IJ  PC and left AVF (05/24/16, Dr Bridgett Larsson)  Dialysis: TTS SW 4h 73kg 3K/ 2.25 bath Hep 7300 R IJ cath/ maturing L BCF (05/24/16) Aranesp 40 g weekly     pth 133 no vit d   Assessment: 1.Abdominal pain/nausea/vomiting: cholecystitis-sp  IR  perc chole drainage 8/07  Blood Cx:1/2 klebsiellapneumoniae.  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 Failed trial of holding dialysis, looks better now back on HD. Cont TTS.  3. Anemia: Secondary to ESRD/multiple myeloma-max ESA  4.Multiple myeloma:on chemotherapy/radiation to left femur-further management per Dr. 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 Last phos 4.0  07/15/16 and no binder needed as op  With all phos less than 4.0 on op labs /  Fu am renal labs  6.  Volume - low bp's on midodrine, 2kg under dry wt, has some periph edema due to low alb 7.  Debility - he wants to walk but not sure if he can   Plan - cont TTS HD, vol good   [name] MD [facility] pager [phone]    cell [phone] 07/23/2016, 10:33 AM         Labs: Basic Metabolic Panel:  Recent  Labs Lab 07/19/16 0535  07/21/16 0503 07/22/16 0603 07/23/16 0521  NA 135  < > 136 135 135  K 3.8  < > 4.1 3.8 4.1  CL 101  < > 98* 97* 97*  CO2 25  < > 28 29 29   GLUCOSE 144*  < > 157* 153* 158*  BUN 52*  < > 31* 21* 17  CREATININE 3.51*  < > 2.56* 1.90* 1.91*  CALCIUM 7.7*  < > 7.8* 7.7* 7.9*  PHOS 3.9  --   --   --   --   < > = values in this interval not displayed. Liver Function Tests:  Recent Labs Lab 07/19/16 0535 07/20/16 0500 07/21/16 0503  AST 17 21 28   ALT 15* 15* 16*  ALKPHOS 206* 208* 199*  BILITOT 2.3* 1.8* 1.8*  PROT 4.8* 4.9* 4.9*  ALBUMIN 1.7* 1.6* 1.6*   No results for input(s): LIPASE, AMYLASE in the last 168 hours. No results for input(s): AMMONIA in the last 168 hours. CBC:  Recent Labs Lab 07/18/16 0531 07/19/16 0535 07/20/16 0500 07/21/16 0503 07/22/16 0603 07/23/16 0521  WBC 5.7 4.8 6.1 8.1 8.3 9.8  NEUTROABS 4.5 3.7 5.1  --   --   --   HGB 8.1* 8.3*  8.0* 7.7* 7.5* 7.6*  HCT 24.9* 25.5* 24.2* 23.6* 23.3* 24.2*  MCV 90.9 91.4 90.0 92.9 92.5 94.5  PLT 55* 52* 61* 75* 84* 91*   Cardiac Enzymes: No results for input(s): CKTOTAL, CKMB, CKMBINDEX, TROPONINI in the last 168 hours. CBG: No results for input(s): GLUCAP in the last 168 hours.  Studies/Results: No results found. Medications:   . darbepoetin (ARANESP) injection - DIALYSIS  200 mcg Intravenous Q Sat-HD  . famotidine  20 mg Oral Daily  . feeding supplement (ENSURE ENLIVE)  237 mL Oral BID BM  . fluticasone  1 spray Each Nare BID  . levofloxacin  500 mg Oral Q48H  . midodrine  10 mg Oral TID WC  . multivitamin  1 tablet Oral QHS  . protein supplement  1 scoop Oral TID WC  . vitamin B-12  1,000 mcg Oral Daily  . Vitamin D (Ergocalciferol)  50,000 Units Oral Q7 days

## 2016-07-23 NOTE — Progress Notes (Signed)
Subjective: Reports minimal RUQ pain  Objective: Vital signs in last 24 hours: Temp:  [97.6 F (36.4 C)-98.1 F (36.7 C)] 97.7 F (36.5 C) (08/13 0519) Pulse Rate:  [89-95] 94 (08/13 0519) Resp:  [17-24] 18 (08/13 0519) BP: (94-128)/(60-69) 128/62 (08/13 0519) SpO2:  [93 %-95 %] 94 % (08/13 0519) Weight:  [69.6 kg (153 lb 7 oz)-69.9 kg (154 lb 1.6 oz)] 69.9 kg (154 lb 1.6 oz) (08/12 2107) Last BM Date: 07/20/16  Intake/Output from previous day: 08/12 0701 - 08/13 0700 In: 40 [P.O.:40] Out: 1300 [Urine:300] Intake/Output this shift: No intake/output data recorded.  Abdomen soft, non tender  Lab Results:   Recent Labs  07/22/16 0603 07/23/16 0521  WBC 8.3 9.8  HGB 7.5* 7.6*  HCT 23.3* 24.2*  PLT 84* 91*   BMET  Recent Labs  07/22/16 0603 07/23/16 0521  NA 135 135  K 3.8 4.1  CL 97* 97*  CO2 29 29  GLUCOSE 153* 158*  BUN 21* 17  CREATININE 1.90* 1.91*  CALCIUM 7.7* 7.9*   PT/INR No results for input(s): LABPROT, INR in the last 72 hours. ABG No results for input(s): PHART, HCO3 in the last 72 hours.  Invalid input(s): PCO2, PO2  Studies/Results: No results found.  Anti-infectives: Anti-infectives    Start     Dose/Rate Route Frequency Ordered Stop   07/20/16 1200  levofloxacin (LEVAQUIN) tablet 500 mg     500 mg Oral Every 48 hours 07/18/16 1113     07/18/16 1200  levofloxacin (LEVAQUIN) tablet 750 mg     750 mg Oral  Once 07/18/16 1113 07/18/16 1309   07/17/16 1800  vancomycin (VANCOCIN) IVPB 1000 mg/200 mL premix  Status:  Discontinued     1,000 mg 200 mL/hr over 60 Minutes Intravenous To Radiology 07/17/16 1701 07/18/16 1108   07/15/16 1800  aztreonam (AZACTAM) 1 g in dextrose 5 % 50 mL IVPB  Status:  Discontinued     1 g 100 mL/hr over 30 Minutes Intravenous Every 24 hours 07/14/16 1232 07/18/16 1108   07/15/16 1200  vancomycin (VANCOCIN) IVPB 750 mg/150 ml premix  Status:  Discontinued     750 mg 150 mL/hr over 60 Minutes Intravenous  Every T-Th-Sa (Hemodialysis) 07/14/16 1232 07/15/16 1445   07/14/16 1900  vancomycin (VANCOCIN) IVPB 750 mg/150 ml premix  Status:  Discontinued     750 mg 150 mL/hr over 60 Minutes Intravenous Every 24 hours 07/13/16 1740 07/14/16 1232   07/14/16 1800  fluconazole (DIFLUCAN) IVPB 200 mg  Status:  Discontinued     200 mg 100 mL/hr over 60 Minutes Intravenous Every 24 hours 07/13/16 1701 07/14/16 0949   07/14/16 1500  levofloxacin (LEVAQUIN) IVPB 500 mg  Status:  Discontinued     500 mg 100 mL/hr over 60 Minutes Intravenous Every 48 hours 07/13/16 1731 07/14/16 0949   07/13/16 2200  metroNIDAZOLE (FLAGYL) IVPB 500 mg  Status:  Discontinued     500 mg 100 mL/hr over 60 Minutes Intravenous Every 8 hours 07/13/16 1732 07/17/16 1409   07/13/16 1745  aztreonam (AZACTAM) 1 g in dextrose 5 % 50 mL IVPB  Status:  Discontinued     1 g 100 mL/hr over 30 Minutes Intravenous Every 8 hours 07/13/16 1731 07/14/16 1232   07/13/16 1730  fluconazole (DIFLUCAN) IVPB 400 mg     400 mg 100 mL/hr over 120 Minutes Intravenous  Once 07/13/16 1701 07/13/16 2251   07/13/16 1700  vancomycin (VANCOCIN) 1,500 mg in sodium chloride  0.9 % 500 mL IVPB     1,500 mg 250 mL/hr over 120 Minutes Intravenous  Once 07/13/16 1651 07/13/16 1951   07/13/16 1445  ciprofloxacin (CIPRO) IVPB 400 mg     400 mg 200 mL/hr over 60 Minutes Intravenous  Once 07/13/16 1434 07/13/16 1555   07/13/16 1445  metroNIDAZOLE (FLAGYL) IVPB 500 mg     500 mg 100 mL/hr over 60 Minutes Intravenous  Once 07/13/16 1434 07/13/16 1721      Assessment/Plan: Cholecystitis s/p IR placed percutaneous cholecystostomy tube  Continue drain.  Will keep in 6 - 8 weeks PO as tolerated  LOS: 10 days    Keesha Pellum A 07/23/2016

## 2016-07-23 NOTE — Progress Notes (Signed)
PROGRESS NOTE    Thomas [name]  ZOX:[id number] DOB: 1945-12-15 DOA: 07/13/2016 PCP: No PCP Per Patient   Chief Complaint  Patient presents with  . Abdominal Pain  . abnormal labs  . Leg Pain    Brief Narrative:   Assessment & Plan   Hypotension -Patient did require vasopressors while in the ICU -Resolved, currently on midodrine   Sepsis secondary to Klebsiella pneumoniae bacteremia -blood cultures from 07/13/2016 +kleb pneumoniae -MRI/MRCP showed findings c/w cholecystitis, no stones, no CBD stones along with asc and des colitis -Patient evaluated by general surgery initially, for abnormal liver function, possible CBD stone. -Presentation was concerning for acalculous cholecystitis. Surgery recommended percutaneous drain which was placed by IR on 07/17/2016 -Sepsis physiology improving -vancomycin discontinued 8/5, also discontinued Flagyl and aztreonam -Transitioned to levofloxacin for a total of 3 weeks for Klebsiella pneumonia bacteremia  -Remains afebrile with no leukocytosis  Anemia/Thrombocytopenia/Multiple myeloma -in the setting of multiple myeloma, received 2 units of packed red blood cell transfusions prior to IR procedure -Oncology consulted and appreciated -Serum light chain has dropped significantly to near normal ratio -Due to recent pancytopenia, treatment is placed on hold -Pancytopenia has improved since discontinuation of Revlimid.  HIT panel negative  -Anemia is related to chronic renal failure. He is getting ESA through dialysis center -Previous hospitalist discussed with Heath Lark, MD (onc), no further platelet transfusions indicated at this time -Today Hemoglobin 7.6, platelets 91  Transaminitis/cholecystitis -Improving, passed biliary stone, s/p Percutaneous cholecystostomy drain placed 8/7 in IR -Gastroenterology, IR, general surgery consulted and appreciated -Drain to remain 6-8 weeks -Continue pain control PRN  End-stage renal disease -Secondary  to multiple myeloma and getting hemodialysis via right IJ tunneled dialysis catheter -Nephrology consulted and appreciated -Creatinine today 1.91 -Per nephrology, patient to resume HD TTS   Multiple myeloma -On chemotherapy/radiation to left femur-further management per hematology -Due to recent pancytopenia, treatment is placed on hold -Continue aggressive supportive care -He has appointment to follow-up in the oncology clinic next week on 07/17/2016  Goals of care -Discussed possible palliative care consult with patient and family- for pain and symptom management, they declined at this time.  Deconditioning -pending PT eval -OT eval recommended CIR vs SNF   DVT Prophylaxis  SCDs  Code Status: Full  Family Communication: Wife and daughter at bedside  Disposition Plan: Admitted. Possible discharge in 24-48 hours  Consultants Nephrology  General surgery Interventional radiology PCCM  Procedures  Abdominal US Percutaneous cholecystostomy drain placement  Antibiotics   Anti-infectives    Start     Dose/Rate Route Frequency Ordered Stop   07/20/16 1200  levofloxacin (LEVAQUIN) tablet 500 mg     500 mg Oral Every 48 hours 07/18/16 1113     07/18/16 1200  levofloxacin (LEVAQUIN) tablet 750 mg     750 mg Oral  Once 07/18/16 1113 07/18/16 1309   07/17/16 1800  vancomycin (VANCOCIN) IVPB 1000 mg/200 mL premix  Status:  Discontinued     1,000 mg 200 mL/hr over 60 Minutes Intravenous To Radiology 07/17/16 1701 07/18/16 1108   07/15/16 1800  aztreonam (AZACTAM) 1 g in dextrose 5 % 50 mL IVPB  Status:  Discontinued     1 g 100 mL/hr over 30 Minutes Intravenous Every 24 hours 07/14/16 1232 07/18/16 1108   07/15/16 1200  vancomycin (VANCOCIN) IVPB 750 mg/150 ml premix  Status:  Discontinued     750 mg 150 mL/hr over 60 Minutes Intravenous Every T-Th-Sa (Hemodialysis) 07/14/16 1232 07/15/16 1445   07/14/16  1900  vancomycin (VANCOCIN) IVPB 750 mg/150 ml premix  Status:   Discontinued     750 mg 150 mL/hr over 60 Minutes Intravenous Every 24 hours 07/13/16 1740 07/14/16 1232   07/14/16 1800  fluconazole (DIFLUCAN) IVPB 200 mg  Status:  Discontinued     200 mg 100 mL/hr over 60 Minutes Intravenous Every 24 hours 07/13/16 1701 07/14/16 0949   07/14/16 1500  levofloxacin (LEVAQUIN) IVPB 500 mg  Status:  Discontinued     500 mg 100 mL/hr over 60 Minutes Intravenous Every 48 hours 07/13/16 1731 07/14/16 0949   07/13/16 2200  metroNIDAZOLE (FLAGYL) IVPB 500 mg  Status:  Discontinued     500 mg 100 mL/hr over 60 Minutes Intravenous Every 8 hours 07/13/16 1732 07/17/16 1409   07/13/16 1745  aztreonam (AZACTAM) 1 g in dextrose 5 % 50 mL IVPB  Status:  Discontinued     1 g 100 mL/hr over 30 Minutes Intravenous Every 8 hours 07/13/16 1731 07/14/16 1232   07/13/16 1730  fluconazole (DIFLUCAN) IVPB 400 mg     400 mg 100 mL/hr over 120 Minutes Intravenous  Once 07/13/16 1701 07/13/16 2251   07/13/16 1700  vancomycin (VANCOCIN) 1,500 mg in sodium chloride 0.9 % 500 mL IVPB     1,500 mg 250 mL/hr over 120 Minutes Intravenous  Once 07/13/16 1651 07/13/16 1951   07/13/16 1445  ciprofloxacin (CIPRO) IVPB 400 mg     400 mg 200 mL/hr over 60 Minutes Intravenous  Once 07/13/16 1434 07/13/16 1555   07/13/16 1445  metroNIDAZOLE (FLAGYL) IVPB 500 mg     500 mg 100 mL/hr over 60 Minutes Intravenous  Once 07/13/16 1434 07/13/16 1721      Subjective:   [name] seen and examined today. Patient states he is feeling better than yesterday, and would like to have a coke.  Feels he has a lot of phlegm in the morning. Denies chest pain, shortness of breath, dizziness, headache.   Objective:   Vitals:   07/22/16 1657 07/22/16 2107 07/23/16 0519 07/23/16 0805  BP: 104/63 119/68 128/62 (!) 114/56  Pulse: 94 94 94 83  Resp: (!) _0 Temp: 98.1 F (36.7 C) 97.8 F (36.6 C) 97.7 F (36.5 C) 98.1 F (36.7 C)  TempSrc: Axillary Oral Oral Oral  SpO2: 95% 94% 94% 94%    Weight:  69.9 kg (154 lb 1.6 oz)    Height:        Intake/Output Summary (Last 24 hours) at 07/23/16 1113 Last data filed at 07/23/16 0700  Gross per 24 hour  Intake               40 ml  Output              300 ml  Net             -260 ml   Filed Weights   07/22/16 0725 07/22/16 1107 07/22/16 2107  Weight: 70.6 kg (155 lb 10.3 oz) 69.6 kg (153 lb 7 oz) 69.9 kg (154 lb 1.6 oz)    Exam  General: Well developed, elderly, chronically ill appearing  HEENT: NCAT,  mucous membranes moist.   Cardiovascular: S1 S2 auscultated, no murmurs appreciated, RRR  Respiratory: Clear to auscultation bilaterally with equal chest rise  Abdomen: Soft, RUQ TTP/drain in place, nondistended, + bowel sounds  Extremities: warm dry without cyanosis clubbing. +LE edema. LUE AVF  Neuro: AAOx3, nonfocal  Psych: Appropriate mood and affect, pleasant  Data Reviewed: I have personally reviewed following labs and imaging studies  CBC:  Recent Labs Lab 07/18/16 0531 07/19/16 0535 07/20/16 0500 07/21/16 0503 07/22/16 0603 07/23/16 0521  WBC 5.7 4.8 6.1 8.1 8.3 9.8  NEUTROABS 4.5 3.7 5.1  --   --   --   HGB 8.1* 8.3* 8.0* 7.7* 7.5* 7.6*  HCT 24.9* 25.5* 24.2* 23.6* 23.3* 24.2*  MCV 90.9 91.4 90.0 92.9 92.5 94.5  PLT 55* 52* 61* 75* 84* 91*   Basic Metabolic Panel:  Recent Labs Lab 07/19/16 0535 07/20/16 0500 07/21/16 0503 07/22/16 0603 07/23/16 0521  NA 135 134* 136 135 135  K 3.8 3.6 4.1 3.8 4.1  CL 101 99* 98* 97* 97*  CO2 _0 GLUCOSE 144* 164* 157* 153* 158*  BUN 52* 58* 31* 21* 17  CREATININE 3.51* 3.77* 2.56* 1.90* 1.91*  CALCIUM 7.7* 7.8* 7.8* 7.7* 7.9*  PHOS 3.9  --   --   --   --    GFR: Estimated Creatinine Clearance: 36.6 mL/min (by C-G formula based on SCr of 1.91 mg/dL). Liver Function Tests:  Recent Labs Lab 07/17/16 0504 07/18/16 0531 07/19/16 0535 07/20/16 0500 07/21/16 0503  AST _1 ALT 25 17 15* 15* 16*  ALKPHOS 295* 255* 206*  208* 199*  BILITOT 3.5* 3.1* 2.3* 1.8* 1.8*  PROT 4.7* 4.8* 4.8* 4.9* 4.9*  ALBUMIN 1.7* 1.7* 1.7* 1.6* 1.6*   No results for input(s): LIPASE, AMYLASE in the last 168 hours. No results for input(s): AMMONIA in the last 168 hours. Coagulation Profile:  Recent Labs Lab 07/17/16 0932  INR 1.65   Cardiac Enzymes: No results for input(s): CKTOTAL, CKMB, CKMBINDEX, TROPONINI in the last 168 hours. BNP (last 3 results) No results for input(s): PROBNP in the last 8760 hours. HbA1C: No results for input(s): HGBA1C in the last 72 hours. CBG: No results for input(s): GLUCAP in the last 168 hours. Lipid Profile: No results for input(s): CHOL, HDL, LDLCALC, TRIG, CHOLHDL, LDLDIRECT in the last 72 hours. Thyroid Function Tests: No results for input(s): TSH, T4TOTAL, FREET4, T3FREE, THYROIDAB in the last 72 hours. Anemia Panel: No results for input(s): VITAMINB12, FOLATE, FERRITIN, TIBC, IRON, RETICCTPCT in the last 72 hours. Urine analysis:    Component Value Date/Time   COLORURINE YELLOW 05/21/2016 1531   APPEARANCEUR CLOUDY (A) 05/21/2016 1531   LABSPEC 1.008 05/21/2016 1531   PHURINE 7.5 05/21/2016 1531   GLUCOSEU NEGATIVE 05/21/2016 1531   HGBUR LARGE (A) 05/21/2016 1531   BILIRUBINUR NEGATIVE 05/21/2016 1531   KETONESUR NEGATIVE 05/21/2016 1531   PROTEINUR 100 (A) 05/21/2016 1531   NITRITE NEGATIVE 05/21/2016 1531   LEUKOCYTESUR NEGATIVE 05/21/2016 1531   Sepsis Labs: _2 (procalcitonin:4,lacticidven:4)  ) Recent Results (from the past 240 hour(s))  Culture, blood (routine x 2)     Status: None   Collection Time: 07/13/16  4:20 PM  Result Value Ref Range Status   Specimen Description BLOOD RIGHT WRIST  Final   Special Requests BOTTLES DRAWN AEROBIC AND ANAEROBIC 5CC  Final   Culture NO GROWTH 5 DAYS  Final   Report Status 07/18/2016 FINAL  Final  Culture, blood (routine x 2)     Status: Abnormal   Collection Time: 07/13/16  5:04 PM  Result Value Ref Range Status    Specimen Description BLOOD RIGHT HAND BAER  Final   Special Requests 3CC  Final   Culture  Setup Time   Final    GRAM  NEGATIVE RODS AEROBIC BOTTLE ONLY CRITICAL RESULT CALLED TO, READ BACK BY AND VERIFIED WITH: M MACCIA,PHARMD AT 1006 07/15/16 BY L BENFIELD    Culture KLEBSIELLA PNEUMONIAE (A)  Final   Report Status 07/17/2016 FINAL  Final   Organism ID, Bacteria KLEBSIELLA PNEUMONIAE  Final      Susceptibility   Klebsiella pneumoniae - MIC*    AMPICILLIN >=32 RESISTANT Resistant     CEFAZOLIN <=4 SENSITIVE Sensitive     CEFEPIME <=1 SENSITIVE Sensitive     CEFTAZIDIME <=1 SENSITIVE Sensitive     CEFTRIAXONE <=1 SENSITIVE Sensitive     CIPROFLOXACIN 1 SENSITIVE Sensitive     GENTAMICIN <=1 SENSITIVE Sensitive     IMIPENEM <=0.25 SENSITIVE Sensitive     TRIMETH/SULFA 40 SENSITIVE Sensitive     AMPICILLIN/SULBACTAM >=32 RESISTANT Resistant     PIP/TAZO 16 SENSITIVE Sensitive     Extended ESBL NEGATIVE Sensitive     * KLEBSIELLA PNEUMONIAE  Blood Culture ID Panel (Reflexed)     Status: Abnormal   Collection Time: 07/13/16  5:04 PM  Result Value Ref Range Status   Enterococcus species NOT DETECTED NOT DETECTED Final   Vancomycin resistance NOT DETECTED NOT DETECTED Final   Listeria monocytogenes NOT DETECTED NOT DETECTED Final   Staphylococcus species NOT DETECTED NOT DETECTED Final   Staphylococcus aureus NOT DETECTED NOT DETECTED Final   Methicillin resistance NOT DETECTED NOT DETECTED Final   Streptococcus species NOT DETECTED NOT DETECTED Final   Streptococcus agalactiae NOT DETECTED NOT DETECTED Final   Streptococcus pneumoniae NOT DETECTED NOT DETECTED Final   Streptococcus pyogenes NOT DETECTED NOT DETECTED Final   Acinetobacter baumannii NOT DETECTED NOT DETECTED Final   Enterobacteriaceae species DETECTED (A) NOT DETECTED Final    Comment: CRITICAL RESULT CALLED TO, READ BACK BY AND VERIFIED WITH: M MACCIA,PHARMD AT 1006 07/15/16 BY L BENFIELD    Enterobacter cloacae  complex NOT DETECTED NOT DETECTED Final   Escherichia coli NOT DETECTED NOT DETECTED Final   Klebsiella oxytoca NOT DETECTED NOT DETECTED Final   Klebsiella pneumoniae DETECTED (A) NOT DETECTED Final    Comment: CRITICAL RESULT CALLED TO, READ BACK BY AND VERIFIED WITH: M MACCIA,PHARMD AT 1006 07/15/16 BY L BENFIELD    Proteus species NOT DETECTED NOT DETECTED Final   Serratia marcescens NOT DETECTED NOT DETECTED Final   Carbapenem resistance NOT DETECTED NOT DETECTED Final   Haemophilus influenzae NOT DETECTED NOT DETECTED Final   Neisseria meningitidis NOT DETECTED NOT DETECTED Final   Pseudomonas aeruginosa NOT DETECTED NOT DETECTED Final   Candida albicans NOT DETECTED NOT DETECTED Final   Candida glabrata NOT DETECTED NOT DETECTED Final   Candida krusei NOT DETECTED NOT DETECTED Final   Candida parapsilosis NOT DETECTED NOT DETECTED Final   Candida tropicalis NOT DETECTED NOT DETECTED Final  MRSA PCR Screening     Status: None   Collection Time: 07/13/16  6:31 PM  Result Value Ref Range Status   MRSA by PCR NEGATIVE NEGATIVE Final    Comment:        The GeneXpert MRSA Assay (FDA approved for NASAL specimens only), is one component of a comprehensive MRSA colonization surveillance program. It is not intended to diagnose MRSA infection nor to guide or monitor treatment for MRSA infections.   Aerobic/Anaerobic Culture (surgical/deep wound)     Status: None   Collection Time: 07/17/16  5:18 PM  Result Value Ref Range Status   Specimen Description DRAINAGE BILE  Final   Special Requests Normal  Final   Gram Stain   Final    RARE WBC PRESENT, PREDOMINANTLY MONONUCLEAR RARE GRAM NEGATIVE RODS    Culture   Final    MODERATE ENTEROBACTER CLOACAE MODERATE KLEBSIELLA PNEUMONIAE NO ANAEROBES ISOLATED    Report Status 07/22/2016 FINAL  Final   Organism ID, Bacteria ENTEROBACTER CLOACAE  Final   Organism ID, Bacteria KLEBSIELLA PNEUMONIAE  Final      Susceptibility    Enterobacter cloacae - MIC*    CEFAZOLIN >=64 RESISTANT Resistant     CEFEPIME <=1 SENSITIVE Sensitive     CEFTAZIDIME <=1 SENSITIVE Sensitive     CEFTRIAXONE <=1 SENSITIVE Sensitive     CIPROFLOXACIN <=0.25 SENSITIVE Sensitive     GENTAMICIN <=1 SENSITIVE Sensitive     IMIPENEM 0.5 SENSITIVE Sensitive     TRIMETH/SULFA <=20 SENSITIVE Sensitive     PIP/TAZO <=4 SENSITIVE Sensitive     * MODERATE ENTEROBACTER CLOACAE   Klebsiella pneumoniae - MIC*    AMPICILLIN >=32 RESISTANT Resistant     CEFAZOLIN <=4 SENSITIVE Sensitive     CEFEPIME <=1 SENSITIVE Sensitive     CEFTAZIDIME <=1 SENSITIVE Sensitive     CEFTRIAXONE <=1 SENSITIVE Sensitive     CIPROFLOXACIN <=0.25 SENSITIVE Sensitive     GENTAMICIN <=1 SENSITIVE Sensitive     IMIPENEM <=0.25 SENSITIVE Sensitive     TRIMETH/SULFA <=20 SENSITIVE Sensitive     AMPICILLIN/SULBACTAM 4 SENSITIVE Sensitive     PIP/TAZO <=4 SENSITIVE Sensitive     Extended ESBL NEGATIVE Sensitive     * MODERATE KLEBSIELLA PNEUMONIAE      Radiology Studies: No results found.   Scheduled Meds: . darbepoetin (ARANESP) injection - DIALYSIS  200 mcg Intravenous Q Sat-HD  . famotidine  20 mg Oral Daily  . feeding supplement (ENSURE ENLIVE)  237 mL Oral BID BM  . fluticasone  1 spray Each Nare BID  . levofloxacin  500 mg Oral Q48H  . midodrine  10 mg Oral TID WC  . multivitamin  1 tablet Oral QHS  . protein supplement  1 scoop Oral TID WC  . vitamin B-12  1,000 mcg Oral Daily  . Vitamin D (Ergocalciferol)  50,000 Units Oral Q7 days   Continuous Infusions:    LOS: 10 days   Time Spent in minutes   30 minutes  Gracious Renken D.O. on 07/23/2016 at 11:13 AM  Between 7am to 7pm - Pager - [phone]  After 7pm go to [email or web address] - password [email or web address]  And look for the night coverage person covering for me after hours  [facility] Office  [phone]

## 2016-07-24 ENCOUNTER — Other Ambulatory Visit: Payer: PPO

## 2016-07-24 ENCOUNTER — Other Ambulatory Visit: Payer: Self-pay | Admitting: *Deleted

## 2016-07-24 ENCOUNTER — Ambulatory Visit: Payer: PPO

## 2016-07-24 ENCOUNTER — Ambulatory Visit: Payer: PPO | Admitting: Hematology and Oncology

## 2016-07-24 ENCOUNTER — Telehealth: Payer: Self-pay | Admitting: *Deleted

## 2016-07-24 DIAGNOSIS — R64 Cachexia: Secondary | ICD-10-CM

## 2016-07-24 DIAGNOSIS — R531 Weakness: Secondary | ICD-10-CM

## 2016-07-24 DIAGNOSIS — R630 Anorexia: Secondary | ICD-10-CM

## 2016-07-24 LAB — BASIC METABOLIC PANEL
ANION GAP: 9 (ref 5–15)
BUN: 24 mg/dL — AB (ref 6–20)
CALCIUM: 8 mg/dL — AB (ref 8.9–10.3)
CO2: 27 mmol/L (ref 22–32)
Chloride: 99 mmol/L — ABNORMAL LOW (ref 101–111)
Creatinine, Ser: 2.61 mg/dL — ABNORMAL HIGH (ref 0.61–1.24)
GFR calc Af Amer: 27 mL/min — ABNORMAL LOW (ref >60)
GFR, EST NON AFRICAN AMERICAN: 24 mL/min — AB (ref >60)
GLUCOSE: 128 mg/dL — AB (ref 65–99)
Potassium: 4.1 mmol/L (ref 3.5–5.1)
SODIUM: 135 mmol/L (ref 135–145)

## 2016-07-24 LAB — CBC
HCT: 23.4 % — ABNORMAL LOW (ref 39.0–52.0)
Hemoglobin: 7.4 g/dL — ABNORMAL LOW (ref 13.0–17.0)
MCH: 30 pg (ref 26.0–34.0)
MCHC: 31.6 g/dL (ref 30.0–36.0)
MCV: 94.7 fL (ref 78.0–100.0)
PLATELETS: 115 10*3/uL — AB (ref 150–400)
RBC: 2.47 MIL/uL — ABNORMAL LOW (ref 4.22–5.81)
RDW: 15.6 % — AB (ref 11.5–15.5)
WBC: 9.4 10*3/uL (ref 4.0–10.5)

## 2016-07-24 MED ORDER — DEXAMETHASONE 4 MG PO TABS
8.0000 mg | ORAL_TABLET | Freq: Every day | ORAL | Status: AC
  Administered 2016-07-25 – 2016-07-26 (×2): 8 mg via ORAL
  Filled 2016-07-24 (×2): qty 2

## 2016-07-24 MED ORDER — POLYETHYLENE GLYCOL 3350 17 G PO PACK: 17.0000 g | PACK | Freq: Every day | ORAL | Status: DC

## 2016-07-24 NOTE — Progress Notes (Signed)
Physical Therapy Treatment Patient Details Name: Thomas [name] MRN: [id number] DOB: October 10, 1946 Today's Date: 07/24/2016    History of Present Illness   70 year old Caucasian man with past medical history significant for ESRD secondary to multiple myeloma with metastasis to spine and left femur who has been on dialysis for the past 2 months or so (with possibly some partial renal recovery).Started on new chemo drug last week (7/31) and developed significant diarrhea afterwards. He presented on  8/3 with one-day history of nausea, vomiting and abdominal pain Pt did have percutaneous cholecystomy drain placed 8/7.      PT Comments    Continuing work on mobility and increasing activity tolerance with Thomas [name]; He pretty adamantly declined getting OOB today, and described feeling dizzy, and that he couldn't breathe while sitting EOB; O2 sats remained stable on Room Air throughout session; see below for serial BPs;   Today, he did not get OOB (my hope is to have taken these recent sessions to build rapport, give him some control and time to consider the importance of mobility); Our focus next session will be transferring to recliner, and of course increasing activity tolerance;   Thomas [name] repeats his goal is to go home, and I did my best to link the need to get up and work on functional mobility to this goal; We discussed the need for him to tolerate HD in recliner in order to be able to dc home as well;   While Thomas [name], CIR Admissions Coord questions if insurance will cover CIR with his current diagnosis, she did leave to door open to consult Rehab for their weigh-in -- this is worth exploring; From his previous admission I experienced that Thomas [name] can work hard when he chooses to.   Follow Up Recommendations  Other (comment) (Rehab following acute stay; likely to decline SNF)     Equipment Recommendations  None recommended by PT (I believe he is pretty well-equipped)    Recommendations for  Other Services Rehab consult      Precautions / Restrictions Precautions Precautions: Fall Precaution Comments: Watch orthostatics; Biliary tube R side    Mobility  Bed Mobility Overal bed mobility: Needs Assistance Bed Mobility: Supine to Sit     Supine to sit: Mod assist     General bed mobility comments: Light mod assist to square off hips at EOB; noted good initiation of coming to sit  Transfers Overall transfer level: Needs assistance Equipment used: Rolling walker (2 wheeled) Transfers: Sit to/from Stand Sit to Stand: Mod assist;+2 safety/equipment        Lateral/Scoot Transfers: Min assist General transfer comment: Mod assist of 2 to power-up to stand; cues for technique, safety, and hand placement; Reported dizziness in standing, stood less than a minute  Ambulation/Gait                 Stairs            Wheelchair Mobility    Modified Rankin (Stroke Patients Only)       Balance   Sitting-balance support: Bilateral upper extremity supported Sitting balance-Leahy Scale: Fair       Standing balance-Leahy Scale: Poor                      Cognition Arousal/Alertness: Awake/alert Behavior During Therapy: WFL for tasks assessed/performed;Agitated Overall Cognitive Status: Within Functional Limits for tasks assessed  Exercises General Exercises - Lower Extremity Ankle Circles/Pumps: AROM;Strengthening;Both;10 reps;Supine Quad Sets: AROM;Both;5 reps;Supine    General Comments General comments (skin integrity, edema, etc.): O2 sats greater than 90% throughout session conducted on Room Air;   07/24/16 1133 07/24/16 1138 07/24/16 1145  Vital Signs  Patient Position (if appropriate) Orthostatic Vitals --  --   Orthostatic Lying   BP- Lying 119/60 --  --   Pulse- Lying 68 --  --   Orthostatic Sitting  BP- Sitting 92/63 107/54 128/73  Pulse- Sitting 93 89 78 (bed in semi-chair position)          Pertinent Vitals/Pain Pain Assessment: Faces Faces Pain Scale: Hurts even more Pain Location: R side of trunk where biliary drain is Pain Descriptors / Indicators: Aching;Grimacing;Guarding Pain Intervention(s): Monitored during session;Patient requesting pain meds-RN notified    Home Living                      Prior Function            PT Goals (current goals can now be found in the care plan section) Acute Rehab PT Goals Patient Stated Goal: Repeats that he wants to go home Progress towards PT goals: Progressing toward goals (slowly)    Frequency  Min 3X/week    PT Plan Current plan remains appropriate    Co-evaluation             End of Session Equipment Utilized During Treatment: Gait belt (around axillae) Activity Tolerance: Patient limited by fatigue;Patient limited by pain (Limited by postural hypotension as well) Patient left: with call bell/phone within reach;with family/visitor present;in bed (bed in semi-chair position)     Time: 2548-3234 PT Time Calculation (min) (ACUTE ONLY): 30 min  Charges:  $Therapeutic Activity: 23-37 mins                    G Codes:      Quin Hoop 07/24/2016, 1:37 PM  Roney [place], [place] Pager [phone] Office [phone]

## 2016-07-24 NOTE — Progress Notes (Signed)
Thomas [name]   DOB:10-15-1946   ZO#:[id number]    Subjective: The patient is seen in his room. He misses appointment today in the clinic because he is still hospitalized. The patient complained of profound weakness and poor appetite. He denies fever or chills. He underwent hemodialysis recently and complained of profound fatigue afterwards. He is not able to participate fully with physical therapy due to profound weakness The patient denies any recent signs or symptoms of bleeding such as spontaneous epistaxis, hematuria or hematochezia. He has edema on his left upper extremity, improved with elevation   Objective:  Vitals:   07/24/16 0527 07/24/16 0705  BP: 109/60 125/70  Pulse: 77 76  Resp: 16 16  Temp: 98 F (36.7 C) 98.1 F (36.7 C)     Intake/Output Summary (Last 24 hours) at 07/24/16 1722 Last data filed at 07/24/16 1217  Gross per 24 hour  Intake              180 ml  Output              329 ml  Net             -149 ml    GENERAL:alert, no distress and comfortable SKIN: skin color is pale, texture, turgor are normal, no rashes or significant lesions EYES: normal, Conjunctiva are pale and non-injected, sclera clear Musculoskeletal:no cyanosis of digits and no clubbing  NEURO: alert & oriented x 3 with fluent speech, no focal motor/sensory deficits   Labs:  Lab Results  Component Value Date   WBC 9.4 07/24/2016   HGB 7.4 (L) 07/24/2016   HCT 23.4 (L) 07/24/2016   MCV 94.7 07/24/2016   PLT 115 (L) 07/24/2016   NEUTROABS 5.1 07/20/2016    Lab Results  Component Value Date   NA 135 07/24/2016   K 4.1 07/24/2016   CL 99 (L) 07/24/2016   CO2 27 07/24/2016    Assessment & Plan:   Multiple myeloma not having achieved remission ([place]) I review his recent test results with him and family. Serum light chain has dropped significantly to near normal ratio Due to recent pancytopenia, treatment is placed on hold Continue aggressive supportive care Of note, his recent  chemotherapy is not the cause of the acute cholecystitis Will resume in the clinic in 2 weeks  Antineoplastic chemotherapy induced pancytopenia ([place]) Pancytopenia has improved since discontinuation of Revlimid. He is becoming more pancytopenic again especially with thrombocytopenia likely due to side effects of antibiotic therapy. The patient denies any recent signs or symptoms of bleeding such as spontaneous epistaxis, hematuria or hematochezia. The anemia is related to chronic renal failure. He is getting ESA through dialysis center With profound fatigue, I recommend 2 units of blood during hemodialysis tomorrow if possible He does not need platelet transfusion unless is less than 10.  N&V (nausea and vomiting), resolved He had received IV fluids and antiemetics  Abdominal pain, resolved Leukocytosis with Elevated LFT, improving He is currently on broad-spectrum IV antibiotics MRCP suggested possible acute cholecystitis He has IR placement of drainage to on 98/10/9146 without complications.   End-stage renal failure on dialysis Follows with nephrologist to determine the need for hemodialysis  Severe protein calorie malnutrition Encourage oral supplements  Anorexia and cachexia I will discuss with primary service consideration to start him on low-dose dexamethasone 8 mg daily by mouth for the next 2 days to see if this would stimulate his appetite. Of course, that has to be balanced against risks of side-effects  such as risk of poor wound healing and fever suppression.  Severe deconditioning I am concerned about safety at home He will continue PT will hospitalized  Discharge planning Unclear I will return to check on him on Wednesday  [facility], [place], MD 07/24/2016  5:22 PM

## 2016-07-24 NOTE — Telephone Encounter (Signed)
LM for wife that Dr Alvy Bimler will see him in hospital ~ 4:00pm

## 2016-07-24 NOTE — Progress Notes (Signed)
Subjective:  Feels sl stronger /next hd in am Now back on  TTS  schedule   Objective Vital signs in last 24 hours: Vitals:   07/23/16 1806 07/23/16 2040 07/24/16 0527 07/24/16 0705  BP: 127/63 (!) 125/55 109/60 125/70  Pulse: 77 79 77 76  Resp: 20 16 16 16   Temp: 98.5 F (36.9 C) 98 F (36.7 C) 98 F (36.7 C) 98.1 F (36.7 C)  TempSrc: Oral Oral Oral Oral  SpO2:  94% 95% 96%  Weight:  69.9 kg (154 lb 1.3 oz)    Height:       Weight change: -0.71 kg (-1 lb 9.1 oz)  Physical Exam: General: Alert OX3 ,NAD  Family in room  Heart: RRR , no mur or rub  Lungs:  CTA  Abdomen: bs pos. / R U quad  Drain with dark brown fluid  Extremities: trace L peadl edema (chronic prior fx surgery) Dialysis Access: R IJ P. Cath / Left BC AVF pos  Bruit (05/24/16 insert)    OP Dialysis: TTS SW 4h 73kg 3K/ 2.25 bath Hep 7300 R IJ cath/ maturing L BCF (05/24/16) Aranesp 40 g weekly pth 133 no vit d   Problem/Plan: 1.Abdominal pain/nausea/vomiting: cholecystitis-sp IR perc chole drainage 8/07 Blood Cx:1/2 klebsiellapneumoniae. 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 Failed trial of holding dialysis, appears  better  back on HD. Cont TTS.  3. Anemia: Secondary to ESRD/multiple myeloma-/ Infection - max ESA/  hgb   7.4 / ck fe level  Next HD/  4.Multiple myeloma:on chemotherapy/radiation to left femur-further management per Dr. 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 Last phos 3.9  07/19/16 and no binder needed as op With all phos less than 4.0 on op labs / Fu am renal labs  6. Volume/HTN  - low bp's on midodrine,/  under dry wt, his  periph edema is resolving / has some dependent  Edema  due to low alb/ past HD  BP dropped with uf attempt and  Dropped ot 80s when attempty standing in room . Tomor HD no uf .s/till  Has  some Urine op  7.  Debility -Pt to see / ? Rehab in  [place], PA-C [facility] Beeper [phone] 07/24/2016,9:26 AM  LOS: 11 days   Labs: Basic Metabolic Panel:  Recent Labs Lab 07/19/16 0535  07/22/16 0603 07/23/16 0521 07/24/16 0444  NA 135  < > 135 135 135  K 3.8  < > 3.8 4.1 4.1  CL 101  < > 97* 97* 99*  CO2 25  < > 29 29 27   GLUCOSE 144*  < > 153* 158* 128*  BUN 52*  < > 21* 17 24*  CREATININE 3.51*  < > 1.90* 1.91* 2.61*  CALCIUM 7.7*  < > 7.7* 7.9* 8.0*  PHOS 3.9  --   --   --   --   < > = values in this interval not displayed. Liver Function Tests:  Recent Labs Lab 07/19/16 0535 07/20/16 0500 07/21/16 0503  AST 17 21 28   ALT 15* 15* 16*  ALKPHOS 206* 208* 199*  BILITOT 2.3* 1.8* 1.8*  PROT 4.8* 4.9* 4.9*  ALBUMIN 1.7* 1.6* 1.6*   No results for input(s): LIPASE, AMYLASE in the last 168 hours. No results for input(s): AMMONIA in the last 168 hours. CBC:  Recent  Labs Lab 07/18/16 0531 07/19/16 0535 07/20/16 0500 07/21/16 0503 07/22/16 0603 07/23/16 0521 07/24/16 0444  WBC 5.7 4.8 6.1 8.1 8.3 9.8 9.4  NEUTROABS 4.5 3.7 5.1  --   --   --   --   HGB 8.1* 8.3* 8.0* 7.7* 7.5* 7.6* 7.4*  HCT 24.9* 25.5* 24.2* 23.6* 23.3* 24.2* 23.4*  MCV 90.9 91.4 90.0 92.9 92.5 94.5 94.7  PLT 55* 52* 61* 75* 84* 91* 115*   Cardiac Enzymes: No results for input(s): CKTOTAL, CKMB, CKMBINDEX, TROPONINI in the last 168 hours. CBG: No results for input(s): GLUCAP in the last 168 hours.  Studies/Results: No results found. Medications:   . darbepoetin (ARANESP) injection - DIALYSIS  200 mcg Intravenous Q Sat-HD  . famotidine  20 mg Oral Daily  . feeding supplement (ENSURE ENLIVE)  237 mL Oral BID BM  . fluticasone  1 spray Each Nare BID  . levofloxacin  500 mg Oral Q48H  . midodrine  10 mg Oral TID WC  . multivitamin  1 tablet Oral QHS  . protein supplement  1 scoop Oral TID WC  . vitamin B-12  1,000 mcg Oral Daily  . Vitamin D (Ergocalciferol)  50,000 Units Oral Q7 days

## 2016-07-24 NOTE — Progress Notes (Signed)
Drain in place  Nothing else to offer at this point  Available as needed  Drain stays in for 6 - 8 weeks

## 2016-07-24 NOTE — Consult Note (Signed)
   [facility] CM Inpatient Consult   07/24/2016  Thomas [name] 1946-08-27 [id number]     Thomas [name] screened for [facility] program on behalf of his [employer] insurance. Went to bedside to speak with Thomas [name], Thomas [name], and Thomas [name] about [facility] program. Explained that he will receive post hospital transition of care calls and will be evaluated for monthly home visits. Explained that [facility] will not interfere or replace services provided by home health. Chart reviewed and noted that family declined palliative care on consult. Thomas [name] was resting on and off during discussion. Thomas [name] declines issues with obtaining medications and with transportation. Discussed Primary Care MD. Thomas [name] and Thomas [name] state Thomas [name] is who Thomas [name] primarily follows up with post discharge. Confirmed outpatient HD is on Tuesdays, Thursdays, and Saturdays. Thomas [name] states she should be contacted for post hospital discharge calls at [phone]. Written consent obtained. [facility] Care Management packet and contact information provided. Made inpatient RNCM aware of bedside conversation and that [facility] to follow up post discharge.   Will request for Thomas [name] to be assigned to [facility] for transition of care.    Thomas Rolling, MSN-Ed, RN,BSN [facility] Liaison [phone]

## 2016-07-24 NOTE — Progress Notes (Signed)
HIT panel follow up Note: Pt admitted with thrombocytopenia and heparin antibody and SRA were ordered on 8/6. SRA status = active in Epic. I Called lab, order for SRA was never crossed over to their system, so it was never collected. Heparin antibody was negative, Pltc trending up 38 >> 115. Discussed with Dr. Ree Kida, ok not to reorder SRA, ok with d/c heparin allergy.   Pharmacy Antibiotic Note Thomas [name] is a 70 y.o. male with multiple myeloma admitted on 07/13/2016 with sepsis w/ gallbladder as suspected source. Diagnosed with  acute cholecystitis.  S/p  percutaneous cholecystostomy drain on 07/17/16.  Metronidazole DC'd 8/7. /3 Blood Cx:1/2 klebsiella pneumoniae, sensitive to Imipenem (pan sensitive except resistant to ampicillin and unasyn, ESBL negative). Patient allergies reviewed. ID pharmacist- recommended oral levofloxacin for klebsiella bacteremia for total of 3 weeks. Wound cultures also sensitive to flouroquinolones. Patient remains afebrile, WBC stable WNL  Patient has ESRD on HD Tues, Thurs, Sat.-  Last HD done Sat 8/10.  Patient is producing urine at 0.10m/kg/day Scr 2.56  Plan: Continue Levofloxacin 500 mg po q48h Pharmacy sign off.  Height: _0  (185.4 cm) Weight: 154 lb 1.3 oz (69.9 kg) IBW/kg (Calculated) : 79.9  Temp (24hrs), Avg:98.2 F (36.8 C), Min:98 F (36.7 C), Max:98.5 F (36.9 C)   Recent Labs Lab 07/20/16 0500 07/21/16 0503 07/22/16 0603 07/23/16 0521 07/24/16 0444  WBC 6.1 8.1 8.3 9.8 9.4  CREATININE 3.77* 2.56* 1.90* 1.91* 2.61*    Estimated Creatinine Clearance: 26.8 mL/min (by C-G formula based on SCr of 2.61 mg/dL).    Allergies  Allergen Reactions  . Penicillins Rash and Other (See Comments)    Has patient had a PCN reaction causing immediate rash, facial/tongue/throat swelling, SOB or lightheadedness with hypotension: YES + Reaction causing SEVERE RASH involving MUCUS MEMBRANES or SKIN NECROSIS >> YES Reaction that required hospitalization:  NO Reaction occurring within the last 10 years: NO If all of the above answers are "NO", then may proceed with Cephalosporin use.   . Dilaudid [Hydromorphone Hcl] Other (See Comments)    hallucinations  . Fish Allergy Nausea And Vomiting  . Codeine Other (See Comments)    "MAKES ME JUMPY"    Antimicrobials this admission: 8/3 Azactam >>8/8 8/3 Flagyl >>8/7 8/3 Vancomycin >>8/5 8/3 Fluconazole >> 8/3 8/4 Levaquin >>  8/3 Cipro x 1>>8/3  Dose adjustments this admission: Aztreonam doses reduced to reflect ESRD, HD schedule (to give dose after HD on HD days).   Microbiology results: 8/3 BCx:1/2 kleb pna:  PAN sensitive except Resistant to Amp, Unasyn;  ESBL negative 8/3 MRSA PCR: neg 8/7 Wound: ENTEROBACTER CLOACAE/ KLEBSIELLA PNEUMONIAE   Thank you for allowing pharmacy to be a part of this patient's care.  MMaryanna Shape PharmD, BCPS  Clinical Pharmacist  Pager: [phone]  07/24/2016 9:55 AM

## 2016-07-24 NOTE — Progress Notes (Signed)
PROGRESS NOTE    Thomas [name]  ZYY:[id number] DOB: Jan 07, 1946 DOA: 07/13/2016 PCP: No PCP Per Patient   Chief Complaint  Patient presents with  . Abdominal Pain  . abnormal labs  . Leg Pain    Brief Narrative:   Assessment & Plan   Hypotension -Patient did require vasopressors while in the ICU -Resolved, currently on midodrine   Sepsis secondary to Klebsiella pneumoniae bacteremia -blood cultures from 07/13/2016 +kleb pneumoniae -MRI/MRCP showed findings c/w cholecystitis, no stones, no CBD stones along with asc and des colitis -Patient evaluated by general surgery initially, for abnormal liver function, possible CBD stone. -Presentation was concerning for acalculous cholecystitis. Surgery recommended percutaneous drain which was placed by IR on 07/17/2016 -Sepsis physiology improving -vancomycin discontinued 8/5, also discontinued Flagyl and aztreonam -Transitioned to levofloxacin for a total of 3 weeks for Klebsiella pneumonia bacteremia  -Remains afebrile with no leukocytosis  Anemia/Thrombocytopenia/Multiple myeloma -in the setting of multiple myeloma, received 2 units of packed red blood cell transfusions prior to IR procedure -Oncology consulted and appreciated -Serum light chain has dropped significantly to near normal ratio -Due to recent pancytopenia, treatment is placed on hold -Pancytopenia has improved since discontinuation of Revlimid.  HIT panel negative  -Anemia is related to chronic renal failure. He is getting ESA through dialysis center -Previous hospitalist discussed with Heath Lark, MD (onc), no further platelet transfusions indicated at this time -Today Hemoglobin 7.4, platelets 115  Transaminitis/cholecystitis -Improving, passed biliary stone, s/p Percutaneous cholecystostomy drain placed 8/7 in IR -Gastroenterology, IR, general surgery consulted and appreciated -Drain to remain 6-8 weeks -Continue pain control PRN  End-stage renal disease -Secondary  to multiple myeloma and getting hemodialysis via right IJ tunneled dialysis catheter -Nephrology consulted and appreciated -Creatinine today 2.61 -Per nephrology, Patient failed a trial of holding dialysis  patient to resume HD TTS   Multiple myeloma -On chemotherapy/radiation to left femur-further management per hematology -Due to recent pancytopenia, treatment is placed on hold -Continue aggressive supportive care -He will need follow up appointment.  Goals of care -Discussed possible palliative care consult with patient and family- for pain and symptom management, they declined at this time.  Deconditioning -pending PT eval -OT eval recommended CIR vs SNF   DVT Prophylaxis  SCDs  Code Status: Full  Family Communication: Wife and daughter at bedside  Disposition Plan: Admitted. Possible discharge in 24-48 hours pending how patient does with PT  Consultants Nephrology  General surgery Interventional radiology PCCM  Procedures  Abdominal US Percutaneous cholecystostomy drain placement  Antibiotics   Anti-infectives    Start     Dose/Rate Route Frequency Ordered Stop   07/20/16 1200  levofloxacin (LEVAQUIN) tablet 500 mg     500 mg Oral Every 48 hours 07/18/16 1113     07/18/16 1200  levofloxacin (LEVAQUIN) tablet 750 mg     750 mg Oral  Once 07/18/16 1113 07/18/16 1309   07/17/16 1800  vancomycin (VANCOCIN) IVPB 1000 mg/200 mL premix  Status:  Discontinued     1,000 mg 200 mL/hr over 60 Minutes Intravenous To Radiology 07/17/16 1701 07/18/16 1108   07/15/16 1800  aztreonam (AZACTAM) 1 g in dextrose 5 % 50 mL IVPB  Status:  Discontinued     1 g 100 mL/hr over 30 Minutes Intravenous Every 24 hours 07/14/16 1232 07/18/16 1108   07/15/16 1200  vancomycin (VANCOCIN) IVPB 750 mg/150 ml premix  Status:  Discontinued     750 mg 150 mL/hr over 60 Minutes Intravenous Every T-Th-Sa (Hemodialysis)  07/14/16 1232 07/15/16 1445   07/14/16 1900  vancomycin (VANCOCIN) IVPB 750  mg/150 ml premix  Status:  Discontinued     750 mg 150 mL/hr over 60 Minutes Intravenous Every 24 hours 07/13/16 1740 07/14/16 1232   07/14/16 1800  fluconazole (DIFLUCAN) IVPB 200 mg  Status:  Discontinued     200 mg 100 mL/hr over 60 Minutes Intravenous Every 24 hours 07/13/16 1701 07/14/16 0949   07/14/16 1500  levofloxacin (LEVAQUIN) IVPB 500 mg  Status:  Discontinued     500 mg 100 mL/hr over 60 Minutes Intravenous Every 48 hours 07/13/16 1731 07/14/16 0949   07/13/16 2200  metroNIDAZOLE (FLAGYL) IVPB 500 mg  Status:  Discontinued     500 mg 100 mL/hr over 60 Minutes Intravenous Every 8 hours 07/13/16 1732 07/17/16 1409   07/13/16 1745  aztreonam (AZACTAM) 1 g in dextrose 5 % 50 mL IVPB  Status:  Discontinued     1 g 100 mL/hr over 30 Minutes Intravenous Every 8 hours 07/13/16 1731 07/14/16 1232   07/13/16 1730  fluconazole (DIFLUCAN) IVPB 400 mg     400 mg 100 mL/hr over 120 Minutes Intravenous  Once 07/13/16 1701 07/13/16 2251   07/13/16 1700  vancomycin (VANCOCIN) 1,500 mg in sodium chloride 0.9 % 500 mL IVPB     1,500 mg 250 mL/hr over 120 Minutes Intravenous  Once 07/13/16 1651 07/13/16 1951   07/13/16 1445  ciprofloxacin (CIPRO) IVPB 400 mg     400 mg 200 mL/hr over 60 Minutes Intravenous  Once 07/13/16 1434 07/13/16 1555   07/13/16 1445  metroNIDAZOLE (FLAGYL) IVPB 500 mg     500 mg 100 mL/hr over 60 Minutes Intravenous  Once 07/13/16 1434 07/13/16 1721      Subjective:   [name] seen and examined today. Patient states he does not know how he feels today. Feels his abdominal pain waxes and wanes. Denies chest pain, shortness of breath, dizziness, headache. Has not had a bowel movement in days, would like a stool softener.   Objective:   Vitals:   07/23/16 1806 07/23/16 2040 07/24/16 0527 07/24/16 0705  BP: 127/63 (!) 125/55 109/60 125/70  Pulse: 77 79 77 76  Resp: 20 16 16 16   Temp: 98.5 F (36.9 C) 98 F (36.7 C) 98 F (36.7 C) 98.1 F (36.7 C)    TempSrc: Oral Oral Oral Oral  SpO2:  94% 95% 96%  Weight:  69.9 kg (154 lb 1.3 oz)    Height:        Intake/Output Summary (Last 24 hours) at 07/24/16 1139 Last data filed at 07/24/16 0700  Gross per 24 hour  Intake              185 ml  Output              200 ml  Net              -15 ml   Filed Weights   07/22/16 1107 07/22/16 2107 07/23/16 2040  Weight: 69.6 kg (153 lb 7 oz) 69.9 kg (154 lb 1.6 oz) 69.9 kg (154 lb 1.3 oz)    Exam  General: Well developed, elderly, chronically ill appearing  HEENT: NCAT,  mucous membranes moist.   Cardiovascular: S1 S2 auscultated, no murmurs appreciated, RRR  Respiratory: Clear to auscultation bilaterally with equal chest rise  Abdomen: Soft, improving RUQ TTP/drain in place, nondistended, + bowel sounds  Extremities: warm dry without cyanosis clubbing. +LE edema. LUE AVF  Neuro: AAOx3, nonfocal  Psych: Appropriate mood and affect, pleasant  Data Reviewed: I have personally reviewed following labs and imaging studies  CBC:  Recent Labs Lab 07/18/16 0531 07/19/16 0535 07/20/16 0500 07/21/16 0503 07/22/16 0603 07/23/16 0521 07/24/16 0444  WBC 5.7 4.8 6.1 8.1 8.3 9.8 9.4  NEUTROABS 4.5 3.7 5.1  --   --   --   --   HGB 8.1* 8.3* 8.0* 7.7* 7.5* 7.6* 7.4*  HCT 24.9* 25.5* 24.2* 23.6* 23.3* 24.2* 23.4*  MCV 90.9 91.4 90.0 92.9 92.5 94.5 94.7  PLT 55* 52* 61* 75* 84* 91* 161*   Basic Metabolic Panel:  Recent Labs Lab 07/19/16 0535 07/20/16 0500 07/21/16 0503 07/22/16 0603 07/23/16 0521 07/24/16 0444  NA 135 134* 136 135 135 135  K 3.8 3.6 4.1 3.8 4.1 4.1  CL 101 99* 98* 97* 97* 99*  CO2 25 25 28 29 29 27   GLUCOSE 144* 164* 157* 153* 158* 128*  BUN 52* 58* 31* 21* 17 24*  CREATININE 3.51* 3.77* 2.56* 1.90* 1.91* 2.61*  CALCIUM 7.7* 7.8* 7.8* 7.7* 7.9* 8.0*  PHOS 3.9  --   --   --   --   --    GFR: Estimated Creatinine Clearance: 26.8 mL/min (by C-G formula based on SCr of 2.61 mg/dL). Liver Function  Tests:  Recent Labs Lab 07/18/16 0531 07/19/16 0535 07/20/16 0500 07/21/16 0503  AST 17 17 21 28   ALT 17 15* 15* 16*  ALKPHOS 255* 206* 208* 199*  BILITOT 3.1* 2.3* 1.8* 1.8*  PROT 4.8* 4.8* 4.9* 4.9*  ALBUMIN 1.7* 1.7* 1.6* 1.6*   No results for input(s): LIPASE, AMYLASE in the last 168 hours. No results for input(s): AMMONIA in the last 168 hours. Coagulation Profile: No results for input(s): INR, PROTIME in the last 168 hours. Cardiac Enzymes: No results for input(s): CKTOTAL, CKMB, CKMBINDEX, TROPONINI in the last 168 hours. BNP (last 3 results) No results for input(s): PROBNP in the last 8760 hours. HbA1C: No results for input(s): HGBA1C in the last 72 hours. CBG: No results for input(s): GLUCAP in the last 168 hours. Lipid Profile: No results for input(s): CHOL, HDL, LDLCALC, TRIG, CHOLHDL, LDLDIRECT in the last 72 hours. Thyroid Function Tests: No results for input(s): TSH, T4TOTAL, FREET4, T3FREE, THYROIDAB in the last 72 hours. Anemia Panel: No results for input(s): VITAMINB12, FOLATE, FERRITIN, TIBC, IRON, RETICCTPCT in the last 72 hours. Urine analysis:    Component Value Date/Time   COLORURINE YELLOW 05/21/2016 1531   APPEARANCEUR CLOUDY (A) 05/21/2016 1531   LABSPEC 1.008 05/21/2016 1531   PHURINE 7.5 05/21/2016 1531   GLUCOSEU NEGATIVE 05/21/2016 1531   HGBUR LARGE (A) 05/21/2016 1531   BILIRUBINUR NEGATIVE 05/21/2016 1531   KETONESUR NEGATIVE 05/21/2016 1531   PROTEINUR 100 (A) 05/21/2016 1531   NITRITE NEGATIVE 05/21/2016 1531   LEUKOCYTESUR NEGATIVE 05/21/2016 1531   Sepsis Labs: @LABRCNTIP (procalcitonin:4,lacticidven:4)  ) Recent Results (from the past 240 hour(s))  Aerobic/Anaerobic Culture (surgical/deep wound)     Status: None   Collection Time: 07/17/16  5:18 PM  Result Value Ref Range Status   Specimen Description DRAINAGE BILE  Final   Special Requests Normal  Final   Gram Stain   Final    RARE WBC PRESENT, PREDOMINANTLY  MONONUCLEAR RARE GRAM NEGATIVE RODS    Culture   Final    MODERATE ENTEROBACTER CLOACAE MODERATE KLEBSIELLA PNEUMONIAE NO ANAEROBES ISOLATED    Report Status 07/22/2016 FINAL  Final   Organism ID, Bacteria ENTEROBACTER CLOACAE  Final   Organism ID, Bacteria KLEBSIELLA PNEUMONIAE  Final      Susceptibility   Enterobacter cloacae - MIC*    CEFAZOLIN >=64 RESISTANT Resistant     CEFEPIME <=1 SENSITIVE Sensitive     CEFTAZIDIME <=1 SENSITIVE Sensitive     CEFTRIAXONE <=1 SENSITIVE Sensitive     CIPROFLOXACIN <=0.25 SENSITIVE Sensitive     GENTAMICIN <=1 SENSITIVE Sensitive     IMIPENEM 0.5 SENSITIVE Sensitive     TRIMETH/SULFA <=20 SENSITIVE Sensitive     PIP/TAZO <=4 SENSITIVE Sensitive     * MODERATE ENTEROBACTER CLOACAE   Klebsiella pneumoniae - MIC*    AMPICILLIN >=32 RESISTANT Resistant     CEFAZOLIN <=4 SENSITIVE Sensitive     CEFEPIME <=1 SENSITIVE Sensitive     CEFTAZIDIME <=1 SENSITIVE Sensitive     CEFTRIAXONE <=1 SENSITIVE Sensitive     CIPROFLOXACIN <=0.25 SENSITIVE Sensitive     GENTAMICIN <=1 SENSITIVE Sensitive     IMIPENEM <=0.25 SENSITIVE Sensitive     TRIMETH/SULFA <=20 SENSITIVE Sensitive     AMPICILLIN/SULBACTAM 4 SENSITIVE Sensitive     PIP/TAZO <=4 SENSITIVE Sensitive     Extended ESBL NEGATIVE Sensitive     * MODERATE KLEBSIELLA PNEUMONIAE      Radiology Studies: No results found.   Scheduled Meds: . darbepoetin (ARANESP) injection - DIALYSIS  200 mcg Intravenous Q Sat-HD  . famotidine  20 mg Oral Daily  . feeding supplement (ENSURE ENLIVE)  237 mL Oral BID BM  . fluticasone  1 spray Each Nare BID  . levofloxacin  500 mg Oral Q48H  . midodrine  10 mg Oral TID WC  . multivitamin  1 tablet Oral QHS  . polyethylene glycol  17 g Oral Daily  . protein supplement  1 scoop Oral TID WC  . vitamin B-12  1,000 mcg Oral Daily  . Vitamin D (Ergocalciferol)  50,000 Units Oral Q7 days   Continuous Infusions:    [name]: 11 days   Time Spent in minutes    30 minutes  Parris Cudworth D.O. on 07/24/2016 at 11:39 AM  Between 7am to 7pm - Pager - [phone]  After 7pm go to [email or web address] - password [email or web address]  And look for the night coverage person covering for me after hours  [facility] Office  [phone]

## 2016-07-24 NOTE — Care Management Important Message (Signed)
Important Message  Patient Details  Name: Cail Stokley MRN: [id number] Date of Birth: 09-01-1946   Medicare Important Message Given:  Yes    Audra Kagel [address] 07/24/2016, 11:49 AM

## 2016-07-25 LAB — RENAL FUNCTION PANEL
ALBUMIN: 1.7 g/dL — AB (ref 3.5–5.0)
ANION GAP: 5 (ref 5–15)
BUN: 31 mg/dL — ABNORMAL HIGH (ref 6–20)
CALCIUM: 7.9 mg/dL — AB (ref 8.9–10.3)
CO2: 29 mmol/L (ref 22–32)
Chloride: 101 mmol/L (ref 101–111)
Creatinine, Ser: 3.13 mg/dL — ABNORMAL HIGH (ref 0.61–1.24)
GFR, EST AFRICAN AMERICAN: 22 mL/min — AB (ref >60)
GFR, EST NON AFRICAN AMERICAN: 19 mL/min — AB (ref >60)
Glucose, Bld: 107 mg/dL — ABNORMAL HIGH (ref 65–99)
PHOSPHORUS: 4.1 mg/dL (ref 2.5–4.6)
POTASSIUM: 4.1 mmol/L (ref 3.5–5.1)
Sodium: 135 mmol/L (ref 135–145)

## 2016-07-25 LAB — CBC
HCT: 23 % — ABNORMAL LOW (ref 39.0–52.0)
Hemoglobin: 7.2 g/dL — ABNORMAL LOW (ref 13.0–17.0)
MCH: 29.9 pg (ref 26.0–34.0)
MCHC: 31.3 g/dL (ref 30.0–36.0)
MCV: 95.4 fL (ref 78.0–100.0)
PLATELETS: 121 10*3/uL — AB (ref 150–400)
RBC: 2.41 MIL/uL — ABNORMAL LOW (ref 4.22–5.81)
RDW: 15.7 % — AB (ref 11.5–15.5)
WBC: 7.4 10*3/uL (ref 4.0–10.5)

## 2016-07-25 LAB — BASIC METABOLIC PANEL
Anion gap: 8 (ref 5–15)
BUN: 30 mg/dL — AB (ref 6–20)
CALCIUM: 8.1 mg/dL — AB (ref 8.9–10.3)
CO2: 29 mmol/L (ref 22–32)
CREATININE: 3.19 mg/dL — AB (ref 0.61–1.24)
Chloride: 99 mmol/L — ABNORMAL LOW (ref 101–111)
GFR calc Af Amer: 21 mL/min — ABNORMAL LOW (ref >60)
GFR, EST NON AFRICAN AMERICAN: 19 mL/min — AB (ref >60)
GLUCOSE: 113 mg/dL — AB (ref 65–99)
Potassium: 4.1 mmol/L (ref 3.5–5.1)
Sodium: 136 mmol/L (ref 135–145)

## 2016-07-25 LAB — PREPARE RBC (CROSSMATCH)

## 2016-07-25 LAB — TRANSFERRIN: TRANSFERRIN: 90 mg/dL — AB (ref 180–329)

## 2016-07-25 MED ORDER — SODIUM CHLORIDE 0.9 % IV SOLN: Freq: Once | INTRAVENOUS | Status: DC

## 2016-07-25 MED ORDER — POLYETHYLENE GLYCOL 3350 17 G PO PACK
17.0000 g | PACK | Freq: Every day | ORAL | Status: DC | PRN
  Filled 2016-07-25: qty 1

## 2016-07-25 MED ORDER — BOOST / RESOURCE BREEZE PO LIQD: 1.0000 | Freq: Three times a day (TID) | ORAL | Status: DC

## 2016-07-25 NOTE — Progress Notes (Signed)
Nutrition Follow-up  DOCUMENTATION CODES:   Non-severe (moderate) malnutrition in context of chronic illness  INTERVENTION:  Discontinue Ensure.   Provide Boost Breeze po TID, each supplement provides 250 kcal and 9 grams of protein.  Provide nourishment snacks (Ordered).  Encourage adequate PO intake.   NUTRITION DIAGNOSIS:   Malnutrition related to chronic illness as evidenced by moderate depletions of muscle mass, moderate depletion of body fat; ongoing  GOAL:   Patient will meet greater than or equal to 90% of their needs; progressing  MONITOR:   PO intake, Supplement acceptance, Labs, Weight trends, Skin, I & O's  REASON FOR ASSESSMENT:   Malnutrition Screening Tool    ASSESSMENT:   70 year old Caucasian man with past medical history significant for ESRD secondary to multiple myeloma with metastasis to spine and left femur who has been on dialysis for the past 2 months or so (with possibly some partial renal recovery).Started on new chemo drug last week (7/31) and developed significant diarrhea afterwards. He presented on  8/3 with one-day history of nausea, vomiting and abdominal pain and some preceding diarrhea prior to that. Developed septic shock after days of diarrhea Reports decreased ability to tolerate oral intake. In the emergency room he was noted to be hypotensive and ultrasound suggestive of acalculous cholecystitis-seen by surgery and awaiting HIDA scan. S/p Percutaneous cholecystostomy drain placed 8/7. Patient failed a trial of holding dialysis, patient to resume HD TTS   Meal completion has been 0-50%. Pt currently has Ensure ordered and has been refusing them. RD to discontinue Ensure and order nourishment snacks. RD to additionally order Boost Breeze to aid in caloric and protein needs. Pt encouraged to eat his food at meals. Labs and medications reviewed.   Diet Order:  Diet regular Room service appropriate? Yes; Fluid consistency: Thin; Fluid  restriction: 2000 mL Fluid  Skin:  Reviewed, no issues  Last BM:  8/10  Height:   Ht Readings from Last 1 Encounters:  07/20/16 6' 1"  (1.854 m)    Weight:   Wt Readings from Last 1 Encounters:  07/25/16 157 lb 10.1 oz (71.5 kg)    Ideal Body Weight:  83.6 kg  BMI:  Body mass index is 20.8 kg/m.  Estimated Nutritional Needs:   Kcal:  2100-2300  Protein:  100-115 grams  Fluid:  Per MD  EDUCATION NEEDS:   No education needs identified at this time  Corrin Parker, MS, RD, LDN Pager # [phone] After hours/ weekend pager # [phone]

## 2016-07-25 NOTE — Progress Notes (Signed)
PROGRESS NOTE    Thomas [name]  QZR:[id number] DOB: 05-31-1946 DOA: 07/13/2016 PCP: No PCP Per Patient   Chief Complaint  Patient presents with  . Abdominal Pain  . abnormal labs  . Leg Pain    Brief Narrative:   Assessment & Plan   Hypotension -Patient did require vasopressors while in the ICU -Resolved, currently on midodrine   Sepsis secondary to Klebsiella pneumoniae bacteremia -blood cultures from 07/13/2016 +kleb pneumoniae -MRI/MRCP showed findings c/w cholecystitis, no stones, no CBD stones along with asc and des colitis -Patient evaluated by general surgery initially, for abnormal liver function, possible CBD stone. -Presentation was concerning for acalculous cholecystitis. Surgery recommended percutaneous drain which was placed by IR on 07/17/2016 -Sepsis physiology improving -vancomycin discontinued 8/5, also discontinued Flagyl and aztreonam -Transitioned to levofloxacin for a total of 3 weeks for Klebsiella pneumonia bacteremia  -Remains afebrile with no leukocytosis  Anemia/Thrombocytopenia/Multiple myeloma -in the setting of multiple myeloma, received 2 units of packed red blood cell transfusions prior to IR procedure -Oncology consulted and appreciated -Serum light chain has dropped significantly to near normal ratio -Due to recent pancytopenia, treatment is placed on hold -Pancytopenia has improved since discontinuation of Revlimid.  HIT panel negative  -Anemia is related to chronic renal failure. He is getting ESA through dialysis center -Today Hemoglobin 7.2, platelets 121 -07/24/16: Spoke with Dr. Alvy [name] (onc), recommended dexamethasone 58m for 2 days [facility]  Transaminitis/cholecystitis -Improving, passed biliary stone, s/p Percutaneous cholecystostomy drain placed 8/7 in IR -Gastroenterology, IR, general surgery consulted and appreciated -Drain to remain 6-8 weeks -Continue pain control PRN  End-stage renal  disease -Secondary to multiple myeloma and getting hemodialysis via right IJ tunneled dialysis catheter -Nephrology consulted and appreciated -Creatinine today 3.19 -Per nephrology, Patient failed a trial of holding dialysis  patient to resume HD TTS   Multiple myeloma -On chemotherapy/radiation to left femur-further management per hematology -Due to recent pancytopenia, treatment is placed on hold -Continue aggressive supportive care -He will need follow up appointment with Dr. GAlvy [name]-Giving steroids and 2uPRBCs  Goals of care -Discussed possible palliative care consult with patient and family- for pain and symptom management, they declined at this time.  Deconditioning -PT consulted, recommended rehab -OT eval recommended CIR vs SNF -Will speak to patient regarding possible CIR   DVT Prophylaxis  SCDs  Code Status: Full  Family Communication: None at bedside. Seen in hemodialysis  Disposition Plan: Admitted.   Consultants Nephrology  General surgery Interventional radiology PCCM  Procedures  Abdominal UKoreaPercutaneous cholecystostomy drain placement  Antibiotics   Anti-infectives    Start     Dose/Rate Route Frequency Ordered Stop   07/20/16 1200  levofloxacin (LEVAQUIN) tablet 500 mg     500 mg Oral Every 48 hours 07/18/16 1113     07/18/16 1200  levofloxacin (LEVAQUIN) tablet 750 mg     750 mg Oral  Once 07/18/16 1113 07/18/16 1309   07/17/16 1800  vancomycin (VANCOCIN) IVPB 1000 mg/200 mL premix  Status:  Discontinued     1,000 mg 200 mL/hr over 60 Minutes Intravenous To Radiology 07/17/16 1701 07/18/16 1108   07/15/16 1800  aztreonam (AZACTAM) 1 Thomas in dextrose 5 % 50 mL IVPB  Status:  Discontinued     1 Thomas 100 mL/hr over 30 Minutes Intravenous Every 24 hours 07/14/16 1232 07/18/16 1108   07/15/16 1200  vancomycin (VANCOCIN) IVPB 750 mg/150 ml premix  Status:  Discontinued     750 mg  150 mL/hr over 60 Minutes Intravenous Every T-Th-Sa (Hemodialysis) 07/14/16  1232 07/15/16 1445   07/14/16 1900  vancomycin (VANCOCIN) IVPB 750 mg/150 ml premix  Status:  Discontinued     750 mg 150 mL/hr over 60 Minutes Intravenous Every 24 hours 07/13/16 1740 07/14/16 1232   07/14/16 1800  fluconazole (DIFLUCAN) IVPB 200 mg  Status:  Discontinued     200 mg 100 mL/hr over 60 Minutes Intravenous Every 24 hours 07/13/16 1701 07/14/16 0949   07/14/16 1500  levofloxacin (LEVAQUIN) IVPB 500 mg  Status:  Discontinued     500 mg 100 mL/hr over 60 Minutes Intravenous Every 48 hours 07/13/16 1731 07/14/16 0949   07/13/16 2200  metroNIDAZOLE (FLAGYL) IVPB 500 mg  Status:  Discontinued     500 mg 100 mL/hr over 60 Minutes Intravenous Every 8 hours 07/13/16 1732 07/17/16 1409   07/13/16 1745  aztreonam (AZACTAM) 1 Thomas in dextrose 5 % 50 mL IVPB  Status:  Discontinued     1 Thomas 100 mL/hr over 30 Minutes Intravenous Every 8 hours 07/13/16 1731 07/14/16 1232   07/13/16 1730  fluconazole (DIFLUCAN) IVPB 400 mg     400 mg 100 mL/hr over 120 Minutes Intravenous  Once 07/13/16 1701 07/13/16 2251   07/13/16 1700  vancomycin (VANCOCIN) 1,500 mg in sodium chloride 0.9 % 500 mL IVPB     1,500 mg 250 mL/hr over 120 Minutes Intravenous  Once 07/13/16 1651 07/13/16 1951   07/13/16 1445  ciprofloxacin (CIPRO) IVPB 400 mg     400 mg 200 mL/hr over 60 Minutes Intravenous  Once 07/13/16 1434 07/13/16 1555   07/13/16 1445  metroNIDAZOLE (FLAGYL) IVPB 500 mg     500 mg 100 mL/hr over 60 Minutes Intravenous  Once 07/13/16 1434 07/13/16 1721      Subjective:   Thomas [name] seen and examined today.Feels tired today. Denies nausea.  Was able to have a bowel movement yesterday.  Denies chest pain, shortness of breath, dizziness, headache.   Objective:   Vitals:   07/25/16 1045 07/25/16 1100 07/25/16 1110 07/25/16 1155  BP: 118/60 129/75 123/70 (!) 121/59  Pulse:  77 75 78  Resp: _0 Temp: 98.4 F (36.9 C)   97.9 F (36.6 C)  TempSrc: Oral   Oral  SpO2:    97%  Weight:   71.5  kg (157 lb 10.1 oz)   Height:        Intake/Output Summary (Last 24 hours) at 07/25/16 1247 Last data filed at 07/25/16 1110  Gross per 24 hour  Intake              899 ml  Output              335 ml  Net              564 ml   Filed Weights   07/24/16 2025 07/25/16 0658 07/25/16 1110  Weight: 70 kg (154 lb 4.8 oz) 71.4 kg (157 lb 6.5 oz) 71.5 kg (157 lb 10.1 oz)    Exam  General: Well developed, elderly, chronically ill appearing, no distress  HEENT: NCAT,  mucous membranes moist.   Cardiovascular: S1 S2 auscultated, no murmurs appreciated, RRR  Respiratory: Clear to auscultation bilaterally with equal chest rise  Abdomen: Soft, improving RUQ TTP/drain in place, nondistended, + bowel sounds  Extremities: warm dry without cyanosis clubbing. +LE edema. LUE AVF  Neuro: AAOx3, nonfocal  Psych: Appropriate mood and affect, pleasant  Data Reviewed: I have personally reviewed following labs and imaging studies  CBC:  Recent Labs Lab 07/19/16 0535 07/20/16 0500 07/21/16 0503 07/22/16 0603 07/23/16 0521 07/24/16 0444 07/25/16 0705  WBC 4.8 6.1 8.1 8.3 9.8 9.4 7.4  NEUTROABS 3.7 5.1  --   --   --   --   --   HGB 8.3* 8.0* 7.7* 7.5* 7.6* 7.4* 7.2*  HCT 25.5* 24.2* 23.6* 23.3* 24.2* 23.4* 23.0*  MCV 91.4 90.0 92.9 92.5 94.5 94.7 95.4  PLT 52* 61* 75* 84* 91* 115* 121*   Basic Metabolic Panel:  Recent Labs Lab 07/19/16 0535  07/21/16 0503 07/22/16 0603 07/23/16 0521 07/24/16 0444 07/25/16 0705  NA 135  < > 136 135 135 135 136  K 3.8  < > 4.1 3.8 4.1 4.1 4.1  CL 101  < > 98* 97* 97* 99* 99*  CO2 25  < > 28 29 29 27 29  GLUCOSE 144*  < > 157* 153* 158* 128* 113*  BUN 52*  < > 31* 21* 17 24* 30*  CREATININE 3.51*  < > 2.56* 1.90* 1.91* 2.61* 3.19*  CALCIUM 7.7*  < > 7.8* 7.7* 7.9* 8.0* 8.1*  PHOS 3.9  --   --   --   --   --   --   < > = values in this interval not displayed. GFR: Estimated Creatinine Clearance: 22.4 mL/min (by C-Thomas formula based on SCr of  3.19 mg/dL). Liver Function Tests:  Recent Labs Lab 07/19/16 0535 07/20/16 0500 07/21/16 0503  AST 17 21 28  ALT 15* 15* 16*  ALKPHOS 206* 208* 199*  BILITOT 2.3* 1.8* 1.8*  PROT 4.8* 4.9* 4.9*  ALBUMIN 1.7* 1.6* 1.6*   No results for input(s): LIPASE, AMYLASE in the last 168 hours. No results for input(s): AMMONIA in the last 168 hours. Coagulation Profile: No results for input(s): INR, PROTIME in the last 168 hours. Cardiac Enzymes: No results for input(s): CKTOTAL, CKMB, CKMBINDEX, TROPONINI in the last 168 hours. BNP (last 3 results) No results for input(s): PROBNP in the last 8760 hours. HbA1C: No results for input(s): HGBA1C in the last 72 hours. CBG: No results for input(s): GLUCAP in the last 168 hours. Lipid Profile: No results for input(s): CHOL, HDL, LDLCALC, TRIG, CHOLHDL, LDLDIRECT in the last 72 hours. Thyroid Function Tests: No results for input(s): TSH, T4TOTAL, FREET4, T3FREE, THYROIDAB in the last 72 hours. Anemia Panel: No results for input(s): VITAMINB12, FOLATE, FERRITIN, TIBC, IRON, RETICCTPCT in the last 72 hours. Urine analysis:    Component Value Date/Time   COLORURINE YELLOW 05/21/2016 1531   APPEARANCEUR CLOUDY (A) 05/21/2016 1531   LABSPEC 1.008 05/21/2016 1531   PHURINE 7.5 05/21/2016 1531   GLUCOSEU NEGATIVE 05/21/2016 1531   HGBUR LARGE (A) 05/21/2016 1531   BILIRUBINUR NEGATIVE 05/21/2016 1531   KETONESUR NEGATIVE 05/21/2016 1531   PROTEINUR 100 (A) 05/21/2016 1531   NITRITE NEGATIVE 05/21/2016 1531   LEUKOCYTESUR NEGATIVE 05/21/2016 1531   Sepsis Labs: @LABRCNTIP(procalcitonin:4,lacticidven:4)  ) Recent Results (from the past 240 hour(s))  Aerobic/Anaerobic Culture (surgical/deep wound)     Status: None   Collection Time: 07/17/16  5:18 PM  Result Value Ref Range Status   Specimen Description DRAINAGE BILE  Final   Special Requests Normal  Final   Gram Stain   Final    RARE WBC PRESENT, PREDOMINANTLY MONONUCLEAR RARE GRAM  NEGATIVE RODS    Culture   Final    MODERATE ENTEROBACTER CLOACAE MODERATE KLEBSIELLA PNEUMONIAE   NO ANAEROBES ISOLATED    Report Status 07/22/2016 FINAL  Final   Organism ID, Bacteria ENTEROBACTER CLOACAE  Final   Organism ID, Bacteria KLEBSIELLA PNEUMONIAE  Final      Susceptibility   Enterobacter cloacae - MIC*    CEFAZOLIN >=64 RESISTANT Resistant     CEFEPIME <=1 SENSITIVE Sensitive     CEFTAZIDIME <=1 SENSITIVE Sensitive     CEFTRIAXONE <=1 SENSITIVE Sensitive     CIPROFLOXACIN <=0.25 SENSITIVE Sensitive     GENTAMICIN <=1 SENSITIVE Sensitive     IMIPENEM 0.5 SENSITIVE Sensitive     TRIMETH/SULFA <=20 SENSITIVE Sensitive     PIP/TAZO <=4 SENSITIVE Sensitive     * MODERATE ENTEROBACTER CLOACAE   Klebsiella pneumoniae - MIC*    AMPICILLIN >=32 RESISTANT Resistant     CEFAZOLIN <=4 SENSITIVE Sensitive     CEFEPIME <=1 SENSITIVE Sensitive     CEFTAZIDIME <=1 SENSITIVE Sensitive     CEFTRIAXONE <=1 SENSITIVE Sensitive     CIPROFLOXACIN <=0.25 SENSITIVE Sensitive     GENTAMICIN <=1 SENSITIVE Sensitive     IMIPENEM <=0.25 SENSITIVE Sensitive     TRIMETH/SULFA <=20 SENSITIVE Sensitive     AMPICILLIN/SULBACTAM 4 SENSITIVE Sensitive     PIP/TAZO <=4 SENSITIVE Sensitive     Extended ESBL NEGATIVE Sensitive     * MODERATE KLEBSIELLA PNEUMONIAE      Radiology Studies: No results found.   Scheduled Meds: . sodium chloride   Intravenous Once  . darbepoetin (ARANESP) injection - DIALYSIS  200 mcg Intravenous Q Sat-HD  . dexamethasone  8 mg Oral Daily  . famotidine  20 mg Oral Daily  . feeding supplement (ENSURE ENLIVE)  237 mL Oral BID BM  . fluticasone  1 spray Each Nare BID  . levofloxacin  500 mg Oral Q48H  . midodrine  10 mg Oral TID WC  . multivitamin  1 tablet Oral QHS  . protein supplement  1 scoop Oral TID WC  . vitamin B-12  1,000 mcg Oral Daily  . Vitamin D (Ergocalciferol)  50,000 Units Oral Q7 days   Continuous Infusions:    LOS: 12 days   Time Spent in  minutes   30 minutes  MIKHAIL, MARYANN D.O. on 07/25/2016 at 12:47 PM  Between 7am to 7pm - Pager - [phone]  After 7pm go to [email or web address] - password [email or web address]  And look for the night coverage person covering for me after hours  [facility] Office  [phone]  

## 2016-07-25 NOTE — Progress Notes (Signed)
Flushed biliary tube with 5cc ns with no resistance.

## 2016-07-25 NOTE — Progress Notes (Signed)
OT Cancellation Note  Patient Details Name: Thomas [name] MRN: [id number] DOB: Sep 03, 1946   Cancelled Treatment:    Reason Eval/Treat Not Completed: Patient at procedure or test/ unavailable  Britt Bottom 07/25/2016, 12:59 PM

## 2016-07-25 NOTE — Procedures (Signed)
I was present at this dialysis session. I have reviewed the session itself and made appropriate changes.   Notes reviewed. For 2u PRBC with HD today.  3K bath K 4.1.  No UF with no real weight gain overall.  Next HD 07/27/16.    Filed Weights   07/23/16 2040 07/24/16 2025 07/25/16 0658  Weight: 69.9 kg (154 lb 1.3 oz) 70 kg (154 lb 4.8 oz) 71.4 kg (157 lb 6.5 oz)     Recent Labs Lab 07/19/16 0535  07/25/16 0705  NA 135  < > 136  K 3.8  < > 4.1  CL 101  < > 99*  CO2 25  < > 29  GLUCOSE 144*  < > 113*  BUN 52*  < > 30*  CREATININE 3.51*  < > 3.19*  CALCIUM 7.7*  < > 8.1*  PHOS 3.9  --   --   < > = values in this interval not displayed.   Recent Labs Lab 07/19/16 0535 07/20/16 0500  07/23/16 0521 07/24/16 0444 07/25/16 0705  WBC 4.8 6.1  < > 9.8 9.4 7.4  NEUTROABS 3.7 5.1  --   --   --   --   HGB 8.3* 8.0*  < > 7.6* 7.4* 7.2*  HCT 25.5* 24.2*  < > 24.2* 23.4* 23.0*  MCV 91.4 90.0  < > 94.5 94.7 95.4  PLT 52* 61*  < > 91* 115* 121*  < > = values in this interval not displayed.  Scheduled Meds: . sodium chloride   Intravenous Once  . darbepoetin (ARANESP) injection - DIALYSIS  200 mcg Intravenous Q Sat-HD  . dexamethasone  8 mg Oral Daily  . famotidine  20 mg Oral Daily  . feeding supplement (ENSURE ENLIVE)  237 mL Oral BID BM  . fluticasone  1 spray Each Nare BID  . levofloxacin  500 mg Oral Q48H  . midodrine  10 mg Oral TID WC  . multivitamin  1 tablet Oral QHS  . polyethylene glycol  17 g Oral Daily  . protein supplement  1 scoop Oral TID WC  . vitamin B-12  1,000 mcg Oral Daily  . Vitamin D (Ergocalciferol)  50,000 Units Oral Q7 days   Continuous Infusions:  PRN Meds:.sodium chloride, sodium chloride, methocarbamol (ROBAXIN)  IV, morphine injection, ondansetron (ZOFRAN) IV, oxyCODONE, technetium TC 8M mebrofenin   Pearson Grippe  MD 07/25/2016, 9:01 AM

## 2016-07-25 NOTE — Progress Notes (Signed)
PT Cancellation Note  Patient Details Name: Thomas [name] MRN: [id number] DOB: 11/28/1946   Cancelled Treatment:    Reason Eval/Treat Not Completed: Patient at procedure or test/unavailable   Currently in HD;  Will follow up later today as time allows;  Otherwise, will follow up for PT tomorrow;   Thank you,  Roney Marion, [place] Pager [phone] Office [phone]     Roney Marion [facility] 07/25/2016, 8:20 AM

## 2016-07-26 ENCOUNTER — Other Ambulatory Visit: Payer: Self-pay | Admitting: Pharmacist

## 2016-07-26 LAB — BASIC METABOLIC PANEL
ANION GAP: 8 (ref 5–15)
BUN: 17 mg/dL (ref 6–20)
CHLORIDE: 98 mmol/L — AB (ref 101–111)
CO2: 27 mmol/L (ref 22–32)
Calcium: 7.9 mg/dL — ABNORMAL LOW (ref 8.9–10.3)
Creatinine, Ser: 2.07 mg/dL — ABNORMAL HIGH (ref 0.61–1.24)
GFR calc non Af Amer: 31 mL/min — ABNORMAL LOW (ref >60)
GFR, EST AFRICAN AMERICAN: 36 mL/min — AB (ref >60)
GLUCOSE: 232 mg/dL — AB (ref 65–99)
POTASSIUM: 4.1 mmol/L (ref 3.5–5.1)
Sodium: 133 mmol/L — ABNORMAL LOW (ref 135–145)

## 2016-07-26 LAB — CBC
HEMATOCRIT: 33.3 % — AB (ref 39.0–52.0)
HEMOGLOBIN: 10.6 g/dL — AB (ref 13.0–17.0)
MCH: 29 pg (ref 26.0–34.0)
MCHC: 31.8 g/dL (ref 30.0–36.0)
MCV: 91.2 fL (ref 78.0–100.0)
Platelets: 120 10*3/uL — ABNORMAL LOW (ref 150–400)
RBC: 3.65 MIL/uL — ABNORMAL LOW (ref 4.22–5.81)
RDW: 16.2 % — AB (ref 11.5–15.5)
WBC: 8.2 10*3/uL (ref 4.0–10.5)

## 2016-07-26 LAB — TYPE AND SCREEN
ABO/RH(D): A POS
ANTIBODY SCREEN: NEGATIVE
UNIT DIVISION: 0
Unit division: 0

## 2016-07-26 MED ORDER — DEXAMETHASONE 4 MG PO TABS
8.0000 mg | ORAL_TABLET | Freq: Every day | ORAL | Status: DC
  Administered 2016-07-27 – 2016-07-31 (×5): 8 mg via ORAL
  Filled 2016-07-26 (×5): qty 2

## 2016-07-26 NOTE — Progress Notes (Signed)
PROGRESS NOTE    Thomas [name]  SEG:[id number] DOB: [birth year] DOA: 07/13/2016 PCP: No PCP Per Patient   Chief Complaint  Patient presents with  . Abdominal Pain  . abnormal labs  . Leg Pain    Brief Narrative:  70 y.o. M with recent diagnosis of MM (end of April 2017), currently undergoing chemo and radiation.  Started on new chemo drug last week (7/31) and developed significant diarrhea afterwards.  Had symptoms for 3 - 4 days then on 8/3 began to have N/V and abd pain.  Found to have possible acalculous cholecystitis.  PCCM asked to admit to ICU due to hypotension.  Barrier to discharge her physical activity and sitting up with hemodialysis. Patient was given 2 units packed red blood cells yesterday with dialysis. Also started on steroids.Asked CIR to evaluate patient however they feel that patient should be discharged to skilled nursing facility, patient on telemetry both refuse. Assessment & Plan   Hypotension -Patient did require vasopressors while in the ICU -Resolved, currently on midodrine   Sepsis secondary to Klebsiella pneumoniae bacteremia -blood cultures from 07/13/2016 +kleb pneumoniae -MRI/MRCP showed findings c/w cholecystitis, no stones, no CBD stones along with asc and des colitis -Patient evaluated by general surgery initially, for abnormal liver function, possible CBD stone. -Presentation was concerning for acalculous cholecystitis. Surgery recommended percutaneous drain which was placed by IR on 07/17/2016 -Sepsis physiology improving -vancomycin discontinued 8/5, also discontinued Flagyl and aztreonam -Transitioned to levofloxacin for a total of 3 weeks for Klebsiella pneumonia bacteremia  -Remains afebrile with no leukocytosis  Anemia/Thrombocytopenia/Multiple myeloma -in the setting of multiple myeloma, received 2 units of packed red blood cell transfusions prior to IR procedure -Oncology consulted and appreciated -Serum light chain has dropped significantly to  near normal ratio -Due to recent pancytopenia, treatment is placed on hold -Pancytopenia has improved since discontinuation of Revlimid.  HIT panel negative  -Anemia is related to chronic renal failure. He is getting ESA through dialysis center -07/24/16: Spoke with Dr. Alvy Bimler (onc), recommended dexamethasone 77m for 2 days [facility] -Today Hemoglobin 10.6, platelets 120  Transaminitis/cholecystitis -Improving, passed biliary stone, s/p Percutaneous cholecystostomy drain placed 8/7 in IR -Gastroenterology, IR, general surgery consulted and appreciated -Drain to remain 6-8 weeks -Continue pain control PRN  End-stage renal disease -Secondary to multiple myeloma and getting hemodialysis via right IJ tunneled dialysis catheter -Nephrology consulted and appreciated -Creatinine today 3.19 -Per nephrology, Patient failed a trial of holding dialysis  patient to resume HD TTS   Multiple myeloma -On chemotherapy/radiation to left femur-further management per hematology -Due to recent pancytopenia, treatment is placed on hold -Continue aggressive supportive care -He will need follow up appointment with Dr. GAlvy Bimler-Gave steroids and 2uPRBCs  Goals of care -Discussed possible palliative care consult with patient and family- for pain and symptom management, they declined at this time.  Deconditioning -PT consulted, recommended rehab -OT eval recommended CIR vs SNF -Asked CIR to evaluate patient however they feel patient would be better suited for skilled nursing facility at this time -Spoke with patient and wife regarding the nursing facility, both refused. -Assume patient needs to dialyze sitting up in order to repair him for discharge.   DVT Prophylaxis  SCDs  Code Status: Full  Family Communication: Wife at bedside.  Disposition Plan: Admitted. Suspect discharge the next several days.  Consultants Nephrology  General  surgery Interventional radiology PCCM  Procedures  Abdominal UKoreaPercutaneous cholecystostomy drain placement  Antibiotics  Anti-infectives    Start     Dose/Rate Route Frequency Ordered Stop   07/20/16 1200  levofloxacin (LEVAQUIN) tablet 500 mg     500 mg Oral Every 48 hours 07/18/16 1113     07/18/16 1200  levofloxacin (LEVAQUIN) tablet 750 mg     750 mg Oral  Once 07/18/16 1113 07/18/16 1309   07/17/16 1800  vancomycin (VANCOCIN) IVPB 1000 mg/200 mL premix  Status:  Discontinued     1,000 mg 200 mL/hr over 60 Minutes Intravenous To Radiology 07/17/16 1701 07/18/16 1108   07/15/16 1800  aztreonam (AZACTAM) 1 g in dextrose 5 % 50 mL IVPB  Status:  Discontinued     1 g 100 mL/hr over 30 Minutes Intravenous Every 24 hours 07/14/16 1232 07/18/16 1108   07/15/16 1200  vancomycin (VANCOCIN) IVPB 750 mg/150 ml premix  Status:  Discontinued     750 mg 150 mL/hr over 60 Minutes Intravenous Every T-Th-Sa (Hemodialysis) 07/14/16 1232 07/15/16 1445   07/14/16 1900  vancomycin (VANCOCIN) IVPB 750 mg/150 ml premix  Status:  Discontinued     750 mg 150 mL/hr over 60 Minutes Intravenous Every 24 hours 07/13/16 1740 07/14/16 1232   07/14/16 1800  fluconazole (DIFLUCAN) IVPB 200 mg  Status:  Discontinued     200 mg 100 mL/hr over 60 Minutes Intravenous Every 24 hours 07/13/16 1701 07/14/16 0949   07/14/16 1500  levofloxacin (LEVAQUIN) IVPB 500 mg  Status:  Discontinued     500 mg 100 mL/hr over 60 Minutes Intravenous Every 48 hours 07/13/16 1731 07/14/16 0949   07/13/16 2200  metroNIDAZOLE (FLAGYL) IVPB 500 mg  Status:  Discontinued     500 mg 100 mL/hr over 60 Minutes Intravenous Every 8 hours 07/13/16 1732 07/17/16 1409   07/13/16 1745  aztreonam (AZACTAM) 1 g in dextrose 5 % 50 mL IVPB  Status:  Discontinued     1 g 100 mL/hr over 30 Minutes Intravenous Every 8 hours 07/13/16 1731 07/14/16 1232   07/13/16 1730  fluconazole (DIFLUCAN) IVPB 400 mg     400 mg 100 mL/hr over 120 Minutes  Intravenous  Once 07/13/16 1701 07/13/16 2251   07/13/16 1700  vancomycin (VANCOCIN) 1,500 mg in sodium chloride 0.9 % 500 mL IVPB     1,500 mg 250 mL/hr over 120 Minutes Intravenous  Once 07/13/16 1651 07/13/16 1951   07/13/16 1445  ciprofloxacin (CIPRO) IVPB 400 mg     400 mg 200 mL/hr over 60 Minutes Intravenous  Once 07/13/16 1434 07/13/16 1555   07/13/16 1445  metroNIDAZOLE (FLAGYL) IVPB 500 mg     500 mg 100 mL/hr over 60 Minutes Intravenous  Once 07/13/16 1434 07/13/16 1721      Subjective:   [name] seen and examined today.FeelsBetter today. Feels he was able to eat more yesterday. Currently denies nausea, vomiting, abdominal pain, chest pain, shortness of breath, dizziness, headache. Has had some loose stools.  Objective:   Vitals:   07/25/16 1155 07/25/16 1615 07/25/16 2153 07/26/16 0840  BP: (!) 121/59 117/60 127/63 125/66  Pulse: 78 89 68 72  Resp: 16 18 16 18   Temp: 97.9 F (36.6 C) 98.4 F (36.9 C) 98.4 F (36.9 C) 97.8 F (36.6 C)  TempSrc: Oral Oral Oral Oral  SpO2: 97% 95% 96% 99%  Weight:   70.6 kg (155 lb 10.3 oz)   Height:        Intake/Output Summary (Last 24 hours) at 07/26/16 1321 Last data filed at 07/26/16  0300  Gross per 24 hour  Intake              300 ml  Output              545 ml  Net             -245 ml   Filed Weights   07/25/16 0658 07/25/16 1110 07/25/16 2153  Weight: 71.4 kg (157 lb 6.5 oz) 71.5 kg (157 lb 10.1 oz) 70.6 kg (155 lb 10.3 oz)    Exam  General: Well developed, elderly, chronically ill appearing, no distress  HEENT: NCAT,  mucous membranes moist.   Cardiovascular: S1 S2 auscultated, RRR  Respiratory: Clear to auscultation bilaterally with equal chest rise  Abdomen: Soft, improving RUQ TTP/drain in place, nondistended, + bowel sounds  Extremities: warm dry without cyanosis clubbing. +LE edema. LUE AVF  Neuro: AAOx3, nonfocal  Psych: Appropriate mood and affect, pleasant  Data Reviewed: I have  personally reviewed following labs and imaging studies  CBC:  Recent Labs Lab 07/20/16 0500  07/22/16 0603 07/23/16 0521 07/24/16 0444 07/25/16 0705 07/26/16 0635  WBC 6.1  < > 8.3 9.8 9.4 7.4 8.2  NEUTROABS 5.1  --   --   --   --   --   --   HGB 8.0*  < > 7.5* 7.6* 7.4* 7.2* 10.6*  HCT 24.2*  < > 23.3* 24.2* 23.4* 23.0* 33.3*  MCV 90.0  < > 92.5 94.5 94.7 95.4 91.2  PLT 61*  < > 84* 91* 115* 121* 120*  < > = values in this interval not displayed. Basic Metabolic Panel:  Recent Labs Lab 07/22/16 0603 07/23/16 0521 07/24/16 0444 07/25/16 0705 07/26/16 0635  NA 135 135 135 135  136 133*  K 3.8 4.1 4.1 4.1  4.1 4.1  CL 97* 97* 99* 101  99* 98*  CO2 29 29 27 29  29 27   GLUCOSE 153* 158* 128* 107*  113* 232*  BUN 21* 17 24* 31*  30* 17  CREATININE 1.90* 1.91* 2.61* 3.13*  3.19* 2.07*  CALCIUM 7.7* 7.9* 8.0* 7.9*  8.1* 7.9*  PHOS  --   --   --  4.1  --    GFR: Estimated Creatinine Clearance: 34.1 mL/min (by C-G formula based on SCr of 2.07 mg/dL). Liver Function Tests:  Recent Labs Lab 07/20/16 0500 07/21/16 0503 07/25/16 0705  AST 21 28  --   ALT 15* 16*  --   ALKPHOS 208* 199*  --   BILITOT 1.8* 1.8*  --   PROT 4.9* 4.9*  --   ALBUMIN 1.6* 1.6* 1.7*   No results for input(s): LIPASE, AMYLASE in the last 168 hours. No results for input(s): AMMONIA in the last 168 hours. Coagulation Profile: No results for input(s): INR, PROTIME in the last 168 hours. Cardiac Enzymes: No results for input(s): CKTOTAL, CKMB, CKMBINDEX, TROPONINI in the last 168 hours. BNP (last 3 results) No results for input(s): PROBNP in the last 8760 hours. HbA1C: No results for input(s): HGBA1C in the last 72 hours. CBG: No results for input(s): GLUCAP in the last 168 hours. Lipid Profile: No results for input(s): CHOL, HDL, LDLCALC, TRIG, CHOLHDL, LDLDIRECT in the last 72 hours. Thyroid Function Tests: No results for input(s): TSH, T4TOTAL, FREET4, T3FREE, THYROIDAB in the  last 72 hours. Anemia Panel: No results for input(s): VITAMINB12, FOLATE, FERRITIN, TIBC, IRON, RETICCTPCT in the last 72 hours. Urine analysis:    Component Value Date/Time  COLORURINE YELLOW 05/21/2016 1531   APPEARANCEUR CLOUDY (A) 05/21/2016 1531   LABSPEC 1.008 05/21/2016 1531   PHURINE 7.5 05/21/2016 1531   GLUCOSEU NEGATIVE 05/21/2016 1531   HGBUR LARGE (A) 05/21/2016 1531   BILIRUBINUR NEGATIVE 05/21/2016 1531   KETONESUR NEGATIVE 05/21/2016 1531   PROTEINUR 100 (A) 05/21/2016 1531   NITRITE NEGATIVE 05/21/2016 1531   LEUKOCYTESUR NEGATIVE 05/21/2016 1531   Sepsis Labs: @LABRCNTIP (procalcitonin:4,lacticidven:4)  ) Recent Results (from the past 240 hour(s))  Aerobic/Anaerobic Culture (surgical/deep wound)     Status: None   Collection Time: 07/17/16  5:18 PM  Result Value Ref Range Status   Specimen Description DRAINAGE BILE  Final   Special Requests Normal  Final   Gram Stain   Final    RARE WBC PRESENT, PREDOMINANTLY MONONUCLEAR RARE GRAM NEGATIVE RODS    Culture   Final    MODERATE ENTEROBACTER CLOACAE MODERATE KLEBSIELLA PNEUMONIAE NO ANAEROBES ISOLATED    Report Status 07/22/2016 FINAL  Final   Organism ID, Bacteria ENTEROBACTER CLOACAE  Final   Organism ID, Bacteria KLEBSIELLA PNEUMONIAE  Final      Susceptibility   Enterobacter cloacae - MIC*    CEFAZOLIN >=64 RESISTANT Resistant     CEFEPIME <=1 SENSITIVE Sensitive     CEFTAZIDIME <=1 SENSITIVE Sensitive     CEFTRIAXONE <=1 SENSITIVE Sensitive     CIPROFLOXACIN <=0.25 SENSITIVE Sensitive     GENTAMICIN <=1 SENSITIVE Sensitive     IMIPENEM 0.5 SENSITIVE Sensitive     TRIMETH/SULFA <=20 SENSITIVE Sensitive     PIP/TAZO <=4 SENSITIVE Sensitive     * MODERATE ENTEROBACTER CLOACAE   Klebsiella pneumoniae - MIC*    AMPICILLIN >=32 RESISTANT Resistant     CEFAZOLIN <=4 SENSITIVE Sensitive     CEFEPIME <=1 SENSITIVE Sensitive     CEFTAZIDIME <=1 SENSITIVE Sensitive     CEFTRIAXONE <=1 SENSITIVE  Sensitive     CIPROFLOXACIN <=0.25 SENSITIVE Sensitive     GENTAMICIN <=1 SENSITIVE Sensitive     IMIPENEM <=0.25 SENSITIVE Sensitive     TRIMETH/SULFA <=20 SENSITIVE Sensitive     AMPICILLIN/SULBACTAM 4 SENSITIVE Sensitive     PIP/TAZO <=4 SENSITIVE Sensitive     Extended ESBL NEGATIVE Sensitive     * MODERATE KLEBSIELLA PNEUMONIAE      Radiology Studies: No results found.   Scheduled Meds: . sodium chloride   Intravenous Once  . darbepoetin (ARANESP) injection - DIALYSIS  200 mcg Intravenous Q Sat-HD  . famotidine  20 mg Oral Daily  . feeding supplement  1 Container Oral TID BM  . fluticasone  1 spray Each Nare BID  . levofloxacin  500 mg Oral Q48H  . midodrine  10 mg Oral TID WC  . multivitamin  1 tablet Oral QHS  . protein supplement  1 scoop Oral TID WC  . vitamin B-12  1,000 mcg Oral Daily  . Vitamin D (Ergocalciferol)  50,000 Units Oral Q7 days   Continuous Infusions:    LOS: 13 days   Time Spent in minutes   30 minutes  Brittin Janik D.O. on 07/26/2016 at 1:21 PM  Between 7am to 7pm - Pager - [phone]  After 7pm go to [email or web address] - password [email or web address]  And look for the night coverage person covering for me after hours  [facility] Office  [phone]

## 2016-07-26 NOTE — Progress Notes (Signed)
Pt. voided 525 cc urine at 2300 and bladder scan done by S. Williams,NT w/o problems at 2335

## 2016-07-26 NOTE — Progress Notes (Signed)
Subjective:   HD yesterda, rec 2u PRBC; post weight 71.5kg (?Bed) Feels well CIR comment not enough stamina for IR  Objective Vital signs in last 24 hours: Vitals:   07/25/16 1155 07/25/16 1615 07/25/16 2153 07/26/16 0840  BP: (!) 121/59 117/60 127/63 125/66  Pulse: 78 89 68 72  Resp: _0 Temp: 97.9 F (36.6 C) 98.4 F (36.9 C) 98.4 F (36.9 C) 97.8 F (36.6 C)  TempSrc: Oral Oral Oral Oral  SpO2: 97% 95% 96% 99%  Weight:   70.6 kg (155 lb 10.3 oz)   Height:       Weight change: 1.51 kg (3 lb 5.3 oz)  Physical Exam: General: Alert OX3 ,NAD  Family in room  Heart: RRR , no mur or rub  Lungs:  CTA  Abdomen: bs pos. / R U quad  Drain with dark brown fluid  Extremities: trace L peadl edema (chronic prior fx surgery) Dialysis Access: R IJ P. Cath / Left BC AVF pos  Bruit (05/24/16 insert)    OP Dialysis: TTS SW 4h 73kg 3K/ 2.25 bath Hep 7300 R IJ cath/ maturing L BCF (05/24/16) Aranesp 40 g weekly pth 133 no vit d   Problem/Plan: 1.Abdominal pain/nausea/vomiting: cholecystitis-sp IR perc chole drainage 8/07 Blood Cx:1/2 klebsiellapneumoniae. 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 Failed trial of holding dialysis. Cont TTS.  3. Anemia: Secondary to ESRD/multiple myeloma-/ Infection - max ESA/  2u PRBC 8/15 @ HD  4.Multiple myeloma:on chemotherapy/radiation to left femur-further management per Dr. 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 Last phos 3.9  07/19/16 and no binder needed as op With all phos less than 4.0 on op labs / Fu am renal labs  6. Volume/HTN  - low bp's on midodrine,/  under dry wt, his  periph edema is resolving / has some dependent  Edema  due to low alb/ past HD  BP dropped with uf attempt and  Dropped ot 80s when attempty standing in room . Tomor HD no uf .s/till  Has  some Urine op  7.  Debility -Pt to see / ? Rehab in [place]   07/26/2016,9:02 AM  LOS: 13 days   Labs: Basic Metabolic Panel:  Recent Labs Lab 07/24/16 0444 07/25/16 0705 07/26/16 0635  NA 135 135  136 133*  K 4.1 4.1  4.1 4.1  CL 99* 101  99* 98*  CO2 _1 GLUCOSE 128* 107*  113* 232*  BUN 24* 31*  30* 17  CREATININE 2.61* 3.13*  3.19* 2.07*  CALCIUM 8.0* 7.9*  8.1* 7.9*  PHOS  --  4.1  --    Liver Function Tests:  Recent Labs Lab 07/20/16 0500 07/21/16 0503 07/25/16 0705  AST 21 28  --   ALT 15* 16*  --   ALKPHOS 208* 199*  --   BILITOT 1.8* 1.8*  --   PROT 4.9* 4.9*  --   ALBUMIN 1.6* 1.6* 1.7*   No results for input(s): LIPASE, AMYLASE in the last 168 hours. No results for input(s): AMMONIA in the last 168 hours. CBC:  Recent Labs Lab 07/20/16 0500  07/22/16 0603 07/23/16 0521 07/24/16 0444 07/25/16 0705 07/26/16 0635  WBC 6.1  < > 8.3 9.8 9.4 7.4 8.2  NEUTROABS 5.1  --   --   --   --   --   --  HGB 8.0*  < > 7.5* 7.6* 7.4* 7.2* 10.6*  HCT 24.2*  < > 23.3* 24.2* 23.4* 23.0* 33.3*  MCV 90.0  < > 92.5 94.5 94.7 95.4 91.2  PLT 61*  < > 84* 91* 115* 121* 120*  < > = values in this interval not displayed. Cardiac Enzymes: No results for input(s): CKTOTAL, CKMB, CKMBINDEX, TROPONINI in the last 168 hours. CBG: No results for input(s): GLUCAP in the last 168 hours.  Studies/Results: No results found. Medications:   . sodium chloride   Intravenous Once  . darbepoetin (ARANESP) injection - DIALYSIS  200 mcg Intravenous Q Sat-HD  . dexamethasone  8 mg Oral Daily  . famotidine  20 mg Oral Daily  . feeding supplement  1 Container Oral TID BM  . fluticasone  1 spray Each Nare BID  . levofloxacin  500 mg Oral Q48H  . midodrine  10 mg Oral TID WC  . multivitamin  1 tablet Oral QHS  . protein supplement  1 scoop Oral TID WC  . vitamin B-12  1,000 mcg Oral Daily  . Vitamin D (Ergocalciferol)  50,000 Units Oral Q7 days

## 2016-07-26 NOTE — Progress Notes (Signed)
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 He presented on  8/3 with one-day history of nausea, vomiting and abdominal pain Pt did have percutaneous cholecystomy drain placed 8/7.      PT Comments    Thomas [name] felt much better today, and that showed in his overall performance this session; Performed 4 transfers in all (stand pivot, sit<>stand, lateral scoot, and squat pivot) with mod assist (most with two person assist); Continued postural hypotension with serial BPs as follows:   07/26/16 1451  Orthostatic Lying   BP- Lying 124/72  Orthostatic Sitting  BP- Sitting 126/71  Orthostatic Standing at 0 minutes  BP- Standing at 0 minutes 92/50   Took time to re-emphasize the need to spend time OOB to mitigate the effects of bedrest;   I'm hoping he is turning a corner, and will much more be able to participate over the next PT and OT sessions; Post-acute rehab is still indicated to maximize independence and safety with mobility.  Follow Up Recommendations  Other (comment) (Rehab following acute stay; likely to decline SNF)     Equipment Recommendations       Recommendations for Other Services Other (comment) (Worth considering another Rehab consult if he shows better progress and participation over next few sessions)     Precautions / Restrictions Precautions Precautions: Fall Precaution Comments: Watch orthostatics; Biliary tube R side Restrictions Weight Bearing Restrictions: No    Mobility  Bed Mobility Overal bed mobility: Needs Assistance Bed  Mobility: Supine to Sit     Supine to sit: Min assist     General bed mobility comments: min A for anchoring to move LEs to EOB and to elevate trunk  Transfers Overall transfer level: Needs assistance Equipment used: Rolling walker (2 wheeled) Transfers: Sit to/from [facility];Lateral/Scoot Transfers Sit to Stand: Mod assist;+2 safety/equipment Stand pivot transfers: Mod assist;+2 physical assistance      Lateral/Scoot Transfers: Mod assist General transfer comment: Mod assist of 2 to power-up to stand; cues for technique, safety, and hand placement; Reported dizziness in standing and L LE weakness "L LE going to give out", stood less than a minute, but for two bouts; performed lateral scoot to drop-arm BSC, mod assist, cues for hand placement and technique, took multiple scoots to get on BSC; +2 assist to stand pivot back to recliner  Ambulation/Gait                 Stairs            Wheelchair Mobility    Modified Rankin (Stroke Patients Only)       Balance     Sitting balance-Leahy Scale: Fair       Standing balance-Leahy Scale: Poor                      Cognition Arousal/Alertness: Awake/alert Behavior During Therapy: WFL for tasks assessed/performed Overall Cognitive Status: Within Functional Limits for tasks assessed                      Exercises  General Comments        Pertinent Vitals/Pain Pain Assessment: Faces Faces Pain Scale: Hurts little more Pain Location: R side of trunk at drain site Pain Descriptors / Indicators: Aching;Grimacing Pain Intervention(s): Limited activity within patient's tolerance;Monitored during session;Repositioned;Patient requesting pain meds-RN notified    Home Living Family/patient expects to be discharged to:: Private residence Living Arrangements: Spouse/significant other Available Help at Discharge: Family;Available 24 hours/day Type of Home: House Home Access:  Ramped entrance Entrance Stairs-Rails: Right Home Layout: One level Home Equipment: Bedside commode;Shower seat      Prior Function Level of Independence: Independent with assistive device(s)      Comments: pt and family are very tight knit and "do everything together", likes to do yard work, go to [employer], church on sundays   PT Goals (current goals can now be found in the care plan section) Acute Rehab PT Goals Patient Stated Goal: wants to get  Progress towards PT goals: Progressing toward goals    Frequency  Min 3X/week    PT Plan Current plan remains appropriate    Co-evaluation PT/OT/SLP Co-Evaluation/Treatment: Yes Reason for Co-Treatment: Complexity of the patient's impairments (multi-system involvement);For patient/therapist safety PT goals addressed during session: Mobility/safety with mobility OT goals addressed during session: ADL's and self-care     End of Session Equipment Utilized During Treatment: Gait belt Activity Tolerance: Patient tolerated treatment well;Patient limited by pain Patient left: in chair;with call bell/phone within reach;with family/visitor present     Time: 5248-1859 PT Time Calculation (min) (ACUTE ONLY): 44 min  Charges:  $Therapeutic Activity: 8-22 mins                    G Codes:      Quin Hoop 07/26/2016, 4:28 PM   Roney [place], [place] Pager [phone] Office [phone]

## 2016-07-26 NOTE — Progress Notes (Signed)
Thank you for consult on Thomas [name]. Chart reviewed and note that he has been unable to tolerate any activity OOB due to poor activity tolerance as well as poor motivation with refusal to participate in thereapy and requires max encouragement for minimal activity. Do not feel that he could tolerate intensive rehab program at this time and would recommend SNF for follow up therapy after discharge.

## 2016-07-26 NOTE — Progress Notes (Addendum)
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 He presented on  8/3 with one-day history of nausea, vomiting and abdominal pain Pt did have percutaneous cholecystomy drain placed 8/7.     OT comments  Pt making some progress from last OT session and able to sit EOB with decreased assist and SPT to recliner with mod A + 2. Pt anxious about nursing staff assisting him back to bd. Pt continues to fatigue easily and require extensive assist with ADLs. Pt's BP lying 124/72, sitting 126/71 and standing ,1 minute 92/50. OT will continue to follow acutely  Follow Up Recommendations  CIR    Equipment Recommendations  Other (comment) (TBD at next venue of care)    Recommendations for Other Services      Precautions / Restrictions Precautions Precautions: Fall Precaution Comments: Watch orthostatics; Biliary tube R side Restrictions Weight Bearing Restrictions: No       Mobility Bed Mobility Overal bed mobility: Needs Assistance       Supine to sit: Min assist     General bed mobility comments: min A for anchoring to move LEs to EOB and to elevate trunk  Transfers Overall transfer level: Needs assistance Equipment used: Rolling walker (2 wheeled) Transfers: Sit to/from Stand Sit to Stand: Mod assist;+2 safety/equipment         General transfer comment: Mod assist of 2 to power-up to stand; cues for technique, safety, and hand placement; Reported dizziness in standing and L LE weakness "L LE going to give out", stood less than a minute    Balance     Sitting balance-Leahy Scale: Fair       Standing balance-Leahy Scale: Poor                     ADL       Grooming: Wash/dry hands;Wash/dry face;Min guard;Sitting           Upper Body Dressing : Minimal assistance;Sitting       Toilet Transfer: Moderate assistance;RW;+2 for physical assistance;Cueing for safety;Cueing for sequencing Toilet Transfer Details (indicate cue type and reason): simulated bed to recliner Toileting- Clothing Manipulation and Hygiene: Total assistance         General ADL Comments: pt states that his L LE feels very weak and that it was going to give out on him and make him fall. Pt anxious about nursing staff getting him back to bed and wanted OT and PT to put himi back in bed. Pt educated on benefits of being OOB as well as risks of being in bed extensive amounts of time      Vision  no change from baseline                              Cognition   Behavior During Therapy: WFL for tasks assessed/performed Overall Cognitive Status: Within Functional Limits for tasks assessed                       Extremity/Trunk Assessment  Upper Extremity Assessment Upper  Extremity Assessment: Generalized weakness                        General Comments  pt pleasant and cooperative, anxious. Family very supportive    Pertinent Vitals/ Pain       Pain Assessment: Faces Faces Pain Scale: Hurts little more Pain Location: R side of trunk at drain site Pain Descriptors / Indicators: Aching;Grimacing;Guarding Pain Intervention(s): Limited activity within patient's tolerance;Monitored during session;Repositioned (pt refused pain meds from RN prior to OT/PT arrival)  Home Living Family/patient expects to be discharged to:: Private residence Living Arrangements: Spouse/significant other Available Help at Discharge: Family;Available 24 hours/day Type of Home: House Home Access: Ramped entrance Entrance Stairs-Number of  Steps: 1 step down and then 1 step up into home Entrance Stairs-Rails: Right Home Layout: One level     Bathroom Shower/Tub: Occupational psychologist: Standard     Home Equipment: Bedside commode;Shower seat          Prior Functioning/Environment Level of Independence: Independent with assistive device(s)        Comments: pt and family are very tight knit and "do everything together", likes to do yard work, go to [employer], church on sundays   Frequency Min 2X/week     Progress Toward Goals  OT Goals(current goals can now be found in the care plan section)     Acute Rehab OT Goals Patient Stated Goal: wants to get  OT Goal Formulation: With patient Potential to Achieve Goals: Good  Plan      Co-evaluation    PT/OT/SLP Co-Evaluation/Treatment: Yes Reason for Co-Treatment: Complexity of the patient's impairments (multi-system involvement);For patient/therapist safety   OT goals addressed during session: ADL's and self-care      End of Session Equipment Utilized During Treatment: Gait belt;Rolling walker   Activity Tolerance Patient limited by fatigue;Patient limited by pain;Other (comment) (fearfulness)   Patient Left in chair;with call bell/phone within reach;with family/visitor present, with PT             Time: 4562-5638 OT Time Calculation (min): 32 min  Charges:  OT Treatments $Self Care/Home Management : 8-22 mins $Therapeutic Activity: 8-22 mins  Britt Bottom 07/26/2016, 3:04 PM

## 2016-07-26 NOTE — Progress Notes (Signed)
Thomas [name]   DOB:06/13/1946   MR#:[id number]    Subjective: He is seen in his room. Multiple family members are present. He is doing better. Energy level is improving. He has regained appetite since I started him on dexamethasone. He has participated with physical therapy today  Objective:  Vitals:   07/26/16 0840 07/26/16 1633  BP: 125/66 119/63  Pulse: 72 77  Resp: 18 17  Temp: 97.8 F (36.6 C) 97.5 F (36.4 C)     Intake/Output Summary (Last 24 hours) at 07/26/16 1738 Last data filed at 07/26/16 1636  Gross per 24 hour  Intake              360 ml  Output              545 ml  Net             -185 ml    GENERAL:alert, no distress and comfortable SKIN: skin color, texture, turgor are normal, no rashes or significant lesions EYES: normal, Conjunctiva are pink and non-injected, sclera clear Musculoskeletal:no cyanosis of digits and no clubbing  NEURO: alert & oriented x 3 with fluent speech, no focal motor/sensory deficits   Labs:  Lab Results  Component Value Date   WBC 8.2 07/26/2016   HGB 10.6 (L) 07/26/2016   HCT 33.3 (L) 07/26/2016   MCV 91.2 07/26/2016   PLT 120 (L) 07/26/2016   NEUTROABS 5.1 07/20/2016    Lab Results  Component Value Date   NA 133 (L) 07/26/2016   K 4.1 07/26/2016   CL 98 (L) 07/26/2016   CO2 27 07/26/2016    Assessment & Plan:   Multiple myeloma not having achieved remission (HCC) I review his recent test results with him and family. Serum light chain has dropped significantly to near normal ratio Due to recent pancytopenia, treatment is placed on hold Continue aggressive supportive care Of note, his recent chemotherapy is not the cause of the acute cholecystitis Will resume in the clinic in 2 weeks  Antineoplastic chemotherapy induced pancytopenia (HCC) Pancytopenia has improved since discontinuation of Revlimid. He is becoming more pancytopenic again especially with thrombocytopenia likely due to side effects of antibiotic  therapy. The patient denies any recent signs or symptoms of bleeding such as spontaneous epistaxis, hematuria or hematochezia. The anemia is related to chronic renal failure. He is getting ESA through dialysis center He has significant improvement in energy since he received 2 units of blood on 07/25/2016 He does not need platelet transfusion unless is less than 10.  N&V (nausea and vomiting), resolved He had received IV fluids and antiemetics  Abdominal pain, resolved Leukocytosis with Elevated LFT, improving He is currently on broad-spectrum IV antibiotics MRCP suggested possible acute cholecystitis He has IR placement of drainage to on 07/17/2016 without complications.   End-stage renal failure on dialysis Follows with nephrologist to determine the need for hemodialysis  Severe protein calorie malnutrition Encourage oral supplements  Anorexia and cachexia He is doing very well and is eating everything he can get his hands on since I started him on 8 mg dexamethasone daily. I will continue for another 7 days of 8 mg daily dexamethasone   Severe deconditioning I am concerned about safety at home He will continue PT will hospitalized  Discharge planning Unclear I will return to check on him next week if he is still hospitalized   GORSUCH, NI, MD 07/26/2016  5:38 PM  

## 2016-07-27 ENCOUNTER — Ambulatory Visit: Payer: PPO

## 2016-07-27 LAB — CBC
HEMATOCRIT: 33.4 % — AB (ref 39.0–52.0)
Hemoglobin: 10.6 g/dL — ABNORMAL LOW (ref 13.0–17.0)
MCH: 29 pg (ref 26.0–34.0)
MCHC: 31.7 g/dL (ref 30.0–36.0)
MCV: 91.3 fL (ref 78.0–100.0)
PLATELETS: 121 10*3/uL — AB (ref 150–400)
RBC: 3.66 MIL/uL — ABNORMAL LOW (ref 4.22–5.81)
RDW: 15.6 % — AB (ref 11.5–15.5)
WBC: 7.7 10*3/uL (ref 4.0–10.5)

## 2016-07-27 LAB — RENAL FUNCTION PANEL
ANION GAP: 10 (ref 5–15)
Albumin: 1.8 g/dL — ABNORMAL LOW (ref 3.5–5.0)
BUN: 25 mg/dL — ABNORMAL HIGH (ref 6–20)
CHLORIDE: 98 mmol/L — AB (ref 101–111)
CO2: 26 mmol/L (ref 22–32)
Calcium: 8 mg/dL — ABNORMAL LOW (ref 8.9–10.3)
Creatinine, Ser: 2.5 mg/dL — ABNORMAL HIGH (ref 0.61–1.24)
GFR calc non Af Amer: 25 mL/min — ABNORMAL LOW (ref >60)
GFR, EST AFRICAN AMERICAN: 29 mL/min — AB (ref >60)
Glucose, Bld: 276 mg/dL — ABNORMAL HIGH (ref 65–99)
Phosphorus: 4.2 mg/dL (ref 2.5–4.6)
Potassium: 4.1 mmol/L (ref 3.5–5.1)
Sodium: 134 mmol/L — ABNORMAL LOW (ref 135–145)

## 2016-07-27 LAB — C DIFFICILE QUICK SCREEN W PCR REFLEX
C DIFFICILE (CDIFF) INTERP: NOT DETECTED
C DIFFICILE (CDIFF) TOXIN: NEGATIVE
C Diff antigen: NEGATIVE

## 2016-07-27 MED ORDER — LACTINEX PO CHEW
1.0000 | CHEWABLE_TABLET | Freq: Three times a day (TID) | ORAL | Status: DC
  Administered 2016-07-27 – 2016-08-02 (×17): 1 via ORAL
  Filled 2016-07-27 (×20): qty 1

## 2016-07-27 NOTE — Progress Notes (Signed)
PROGRESS NOTE    Thomas [name]  JME:[id number] DOB: Apr 28, 1946 DOA: 07/13/2016 PCP: No PCP Per Patient   Chief Complaint  Patient presents with  . Abdominal Pain  . abnormal labs  . Leg Pain    Brief Narrative:  70 y.o. M with recent diagnosis of MM (end of April 2017), currently undergoing chemo and radiation.  Started on new chemo drug last week (7/31) and developed significant diarrhea afterwards.  Had symptoms for 3 - 4 days then on 8/3 began to have N/V and abd pain.  Found to have possible acalculous cholecystitis.  PCCM asked to admit to ICU due to hypotension.  Barrier to discharge her physical activity and sitting up with hemodialysis. Patient was given 2 units packed red blood cells yesterday with dialysis. Also started on steroids.Asked CIR to evaluate patient however they feel that patient should be discharged to skilled nursing facility, patient on telemetry both refuse. Assessment & Plan   Hypotension -Patient did require vasopressors while in the ICU -Resolved, currently on midodrine  - checking orthostatics  Loose stools - likely related to diet, chemo or abx induced - will check C diff to be thorough - probiotic  Sepsis secondary to Klebsiella pneumoniae bacteremia -blood cultures from 07/13/2016 +kleb pneumoniae -MRI/MRCP showed findings c/w cholecystitis, no stones, no CBD stones along with asc and des colitis -Patient evaluated by general surgery initially, for abnormal liver function, possible CBD stone. -Presentation was concerning for acalculous cholecystitis. Surgery recommended percutaneous drain which was placed by IR on 07/17/2016 -Sepsis physiology improving -vancomycin discontinued 8/5, also discontinued Flagyl and aztreonam -Transitioned to levofloxacin for a total of 3 weeks for Klebsiella pneumonia bacteremia  -Remains afebrile with no leukocytosis  Anemia/Thrombocytopenia/Multiple myeloma -in the setting of multiple myeloma, received 2 units of packed  red blood cell transfusions prior to IR procedure -Oncology consulted and appreciated -Serum light chain has dropped significantly to near normal ratio -Due to recent pancytopenia, treatment is placed on hold -Pancytopenia has improved since discontinuation of Revlimid.  HIT panel negative  -Anemia is related to chronic renal failure. He is getting ESA through dialysis center -07/24/16: Spoke with Dr. Alvy Bimler (onc), recommended dexamethasone 38m for 2 days -[facility] for transfusion with hemodialysis on 07/25/2016 -Today Hemoglobin 10.6, platelets 120  Transaminitis/cholecystitis -Improving, passed biliary stone, s/p Percutaneous cholecystostomy drain placed 8/7 in IR -Gastroenterology, IR, general surgery consulted and appreciated -Drain to remain 6-8 weeks -Continue pain control PRN  End-stage renal disease -Secondary to multiple myeloma and getting hemodialysis via right IJ tunneled dialysis catheter -Nephrology consulted and appreciated -Creatinine today 3.19 -Per nephrology, Patient failed a trial of holding dialysis  patient to resume HD TTS   Multiple myeloma -On chemotherapy/radiation to left femur-further management per hematology -Due to recent pancytopenia, treatment is placed on hold -Continue aggressive supportive care -He will need follow up appointment with Dr. GAlvy Bimler-Gave steroids and 2uPRBCs  Goals of care -Discussed possible palliative care consult with patient and family- for pain and symptom management, they declined at this time. - discussed d/c with family and pt at length  Deconditioning -PT consulted, recommended rehab -OT eval recommended CIR vs SNF -Asked CIR to evaluate patient however they feel patient would be better suited for skilled nursing facility at this time -Spoke with patient and wife regarding the nursing facility, both refused. -Assume patient needs to dialyze sitting up in order to repair him for discharge.   DVT Prophylaxis   SCDs  Code Status: Full  Family Communication: Wife  at bedside.  Disposition Plan: Admitted. Suspect discharge the next several days.  Consultants Nephrology  General surgery Interventional radiology PCCM IPR  Procedures  Abdominal US Percutaneous cholecystostomy drain placement  Antibiotics   Anti-infectives    Start     Dose/Rate Route Frequency Ordered Stop   07/20/16 1200  levofloxacin (LEVAQUIN) tablet 500 mg     500 mg Oral Every 48 hours 07/18/16 1113     07/18/16 1200  levofloxacin (LEVAQUIN) tablet 750 mg     750 mg Oral  Once 07/18/16 1113 07/18/16 1309   07/17/16 1800  vancomycin (VANCOCIN) IVPB 1000 mg/200 mL premix  Status:  Discontinued     1,000 mg 200 mL/hr over 60 Minutes Intravenous To Radiology 07/17/16 1701 07/18/16 1108   07/15/16 1800  aztreonam (AZACTAM) 1 g in dextrose 5 % 50 mL IVPB  Status:  Discontinued     1 g 100 mL/hr over 30 Minutes Intravenous Every 24 hours 07/14/16 1232 07/18/16 1108   07/15/16 1200  vancomycin (VANCOCIN) IVPB 750 mg/150 ml premix  Status:  Discontinued     750 mg 150 mL/hr over 60 Minutes Intravenous Every T-Th-Sa (Hemodialysis) 07/14/16 1232 07/15/16 1445   07/14/16 1900  vancomycin (VANCOCIN) IVPB 750 mg/150 ml premix  Status:  Discontinued     750 mg 150 mL/hr over 60 Minutes Intravenous Every 24 hours 07/13/16 1740 07/14/16 1232   07/14/16 1800  fluconazole (DIFLUCAN) IVPB 200 mg  Status:  Discontinued     200 mg 100 mL/hr over 60 Minutes Intravenous Every 24 hours 07/13/16 1701 07/14/16 0949   07/14/16 1500  levofloxacin (LEVAQUIN) IVPB 500 mg  Status:  Discontinued     500 mg 100 mL/hr over 60 Minutes Intravenous Every 48 hours 07/13/16 1731 07/14/16 0949   07/13/16 2200  metroNIDAZOLE (FLAGYL) IVPB 500 mg  Status:  Discontinued     500 mg 100 mL/hr over 60 Minutes Intravenous Every 8 hours 07/13/16 1732 07/17/16 1409   07/13/16 1745  aztreonam (AZACTAM) 1 g in dextrose 5 % 50 mL IVPB  Status:  Discontinued      1 g 100 mL/hr over 30 Minutes Intravenous Every 8 hours 07/13/16 1731 07/14/16 1232   07/13/16 1730  fluconazole (DIFLUCAN) IVPB 400 mg     400 mg 100 mL/hr over 120 Minutes Intravenous  Once 07/13/16 1701 07/13/16 2251   07/13/16 1700  vancomycin (VANCOCIN) 1,500 mg in sodium chloride 0.9 % 500 mL IVPB     1,500 mg 250 mL/hr over 120 Minutes Intravenous  Once 07/13/16 1651 07/13/16 1951   07/13/16 1445  ciprofloxacin (CIPRO) IVPB 400 mg     400 mg 200 mL/hr over 60 Minutes Intravenous  Once 07/13/16 1434 07/13/16 1555   07/13/16 1445  metroNIDAZOLE (FLAGYL) IVPB 500 mg     500 mg 100 mL/hr over 60 Minutes Intravenous  Once 07/13/16 1434 07/13/16 1721      Subjective:   [name] seen and examined today.Feels Better today. Wants d/c to CIR. Had loose stools.  Objective:   Vitals:   07/27/16 0930 07/27/16 1000 07/27/16 1018 07/27/16 1116  BP: 111/68 102/65 123/65 119/64  Pulse: 76 79 81 88  Resp: _0 Temp:   97.4 F (36.3 C) 97.6 F (36.4 C)  TempSrc:   Axillary Oral  SpO2:   98% 100%  Weight:   70.4 kg (155 lb 3.3 oz)   Height:        Intake/Output Summary (Last  24 hours) at 07/27/16 1628 Last data filed at 07/27/16 1305  Gross per 24 hour  Intake              540 ml  Output              340 ml  Net              200 ml   Filed Weights   07/26/16 2034 07/27/16 0640 07/27/16 1018  Weight: 69.2 kg (152 lb 8.9 oz) 69.8 kg (153 lb 14.1 oz) 70.4 kg (155 lb 3.3 oz)    Exam  General: Well developed, elderly, chronically ill appearing, NAD  HEENT: NCAT,  mucous membranes moist.   Cardiovascular: S1 S2 auscultated, RRR  Respiratory: Clear to auscultation bilaterally with equal chest rise  Abdomen: Soft, improving RUQ TTP/drain in place, nondistended, + bowel sounds  Extremities: warm dry without cyanosis clubbing. +LE edema. LUE AVF  Neuro: AAOx3, nonfocal  Psych: Appropriate mood and affect, pleasant  Data Reviewed: I have personally reviewed  following labs and imaging studies  CBC:  Recent Labs Lab 07/23/16 0521 07/24/16 0444 07/25/16 0705 07/26/16 0635 07/27/16 0650  WBC 9.8 9.4 7.4 8.2 7.7  HGB 7.6* 7.4* 7.2* 10.6* 10.6*  HCT 24.2* 23.4* 23.0* 33.3* 33.4*  MCV 94.5 94.7 95.4 91.2 91.3  PLT 91* 115* 121* 120* 078*   Basic Metabolic Panel:  Recent Labs Lab 07/23/16 0521 07/24/16 0444 07/25/16 0705 07/26/16 0635 07/27/16 0650  NA 135 135 135  136 133* 134*  K 4.1 4.1 4.1  4.1 4.1 4.1  CL 97* 99* 101  99* 98* 98*  CO2 _0 GLUCOSE 158* 128* 107*  113* 232* 276*  BUN 17 24* 31*  30* 17 25*  CREATININE 1.91* 2.61* 3.13*  3.19* 2.07* 2.50*  CALCIUM 7.9* 8.0* 7.9*  8.1* 7.9* 8.0*  PHOS  --   --  4.1  --  4.2   GFR: Estimated Creatinine Clearance: 28.2 mL/min (by C-G formula based on SCr of 2.5 mg/dL). Liver Function Tests:  Recent Labs Lab 07/21/16 0503 07/25/16 0705 07/27/16 0650  AST 28  --   --   ALT 16*  --   --   ALKPHOS 199*  --   --   BILITOT 1.8*  --   --   PROT 4.9*  --   --   ALBUMIN 1.6* 1.7* 1.8*   No results for input(s): LIPASE, AMYLASE in the last 168 hours. No results for input(s): AMMONIA in the last 168 hours. Coagulation Profile: No results for input(s): INR, PROTIME in the last 168 hours. Cardiac Enzymes: No results for input(s): CKTOTAL, CKMB, CKMBINDEX, TROPONINI in the last 168 hours. BNP (last 3 results) No results for input(s): PROBNP in the last 8760 hours. HbA1C: No results for input(s): HGBA1C in the last 72 hours. CBG: No results for input(s): GLUCAP in the last 168 hours. Lipid Profile: No results for input(s): CHOL, HDL, LDLCALC, TRIG, CHOLHDL, LDLDIRECT in the last 72 hours. Thyroid Function Tests: No results for input(s): TSH, T4TOTAL, FREET4, T3FREE, THYROIDAB in the last 72 hours. Anemia Panel: No results for input(s): VITAMINB12, FOLATE, FERRITIN, TIBC, IRON, RETICCTPCT in the last 72 hours. Urine analysis:    Component Value  Date/Time   COLORURINE YELLOW 05/21/2016 1531   APPEARANCEUR CLOUDY (A) 05/21/2016 1531   LABSPEC 1.008 05/21/2016 1531   PHURINE 7.5 05/21/2016 1531   GLUCOSEU NEGATIVE 05/21/2016 1531   HGBUR LARGE (  A) 05/21/2016 1531   BILIRUBINUR NEGATIVE 05/21/2016 1531   KETONESUR NEGATIVE 05/21/2016 1531   PROTEINUR 100 (A) 05/21/2016 1531   NITRITE NEGATIVE 05/21/2016 1531   LEUKOCYTESUR NEGATIVE 05/21/2016 1531   Sepsis Labs: _0 (procalcitonin:4,lacticidven:4)  ) Recent Results (from the past 240 hour(s))  Aerobic/Anaerobic Culture (surgical/deep wound)     Status: None   Collection Time: 07/17/16  5:18 PM  Result Value Ref Range Status   Specimen Description DRAINAGE BILE  Final   Special Requests Normal  Final   Gram Stain   Final    RARE WBC PRESENT, PREDOMINANTLY MONONUCLEAR RARE GRAM NEGATIVE RODS    Culture   Final    MODERATE ENTEROBACTER CLOACAE MODERATE KLEBSIELLA PNEUMONIAE NO ANAEROBES ISOLATED    Report Status 07/22/2016 FINAL  Final   Organism ID, Bacteria ENTEROBACTER CLOACAE  Final   Organism ID, Bacteria KLEBSIELLA PNEUMONIAE  Final      Susceptibility   Enterobacter cloacae - MIC*    CEFAZOLIN >=64 RESISTANT Resistant     CEFEPIME <=1 SENSITIVE Sensitive     CEFTAZIDIME <=1 SENSITIVE Sensitive     CEFTRIAXONE <=1 SENSITIVE Sensitive     CIPROFLOXACIN <=0.25 SENSITIVE Sensitive     GENTAMICIN <=1 SENSITIVE Sensitive     IMIPENEM 0.5 SENSITIVE Sensitive     TRIMETH/SULFA <=20 SENSITIVE Sensitive     PIP/TAZO <=4 SENSITIVE Sensitive     * MODERATE ENTEROBACTER CLOACAE   Klebsiella pneumoniae - MIC*    AMPICILLIN >=32 RESISTANT Resistant     CEFAZOLIN <=4 SENSITIVE Sensitive     CEFEPIME <=1 SENSITIVE Sensitive     CEFTAZIDIME <=1 SENSITIVE Sensitive     CEFTRIAXONE <=1 SENSITIVE Sensitive     CIPROFLOXACIN <=0.25 SENSITIVE Sensitive     GENTAMICIN <=1 SENSITIVE Sensitive     IMIPENEM <=0.25 SENSITIVE Sensitive     TRIMETH/SULFA <=20 SENSITIVE  Sensitive     AMPICILLIN/SULBACTAM 4 SENSITIVE Sensitive     PIP/TAZO <=4 SENSITIVE Sensitive     Extended ESBL NEGATIVE Sensitive     * MODERATE KLEBSIELLA PNEUMONIAE      Radiology Studies: No results found.   Scheduled Meds: . sodium chloride   Intravenous Once  . darbepoetin (ARANESP) injection - DIALYSIS  200 mcg Intravenous Q Sat-HD  . dexamethasone  8 mg Oral Q breakfast  . famotidine  20 mg Oral Daily  . feeding supplement  1 Container Oral TID BM  . fluticasone  1 spray Each Nare BID  . levofloxacin  500 mg Oral Q48H  . midodrine  10 mg Oral TID WC  . multivitamin  1 tablet Oral QHS  . protein supplement  1 scoop Oral TID WC  . vitamin B-12  1,000 mcg Oral Daily  . Vitamin D (Ergocalciferol)  50,000 Units Oral Q7 days   Continuous Infusions:    LOS: 14 days   Time Spent in minutes   30 minutes  Elwin Mocha MD on 07/27/2016 at 4:28 PM  Between 7am to [place]  After 7pm go to [email or web address] - password [email or web address]  And look for the night coverage person covering for me after hours  [facility] Office  [phone]

## 2016-07-27 NOTE — Procedures (Signed)
I was present at this dialysis session. I have reviewed the session itself and made appropriate changes.   Worked with PT and OT yesterday.  Feels well. Maybe to CIR? 3K bath.  Minimal UF.  Filed Weights   07/25/16 2153 07/26/16 2034 07/27/16 0640  Weight: 70.6 kg (155 lb 10.3 oz) 69.2 kg (152 lb 8.9 oz) 69.8 kg (153 lb 14.1 oz)     Recent Labs Lab 07/27/16 0650  NA 134*  K 4.1  CL 98*  CO2 26  GLUCOSE 276*  BUN 25*  CREATININE 2.50*  CALCIUM 8.0*  PHOS 4.2     Recent Labs Lab 07/25/16 0705 07/26/16 0635 07/27/16 0650  WBC 7.4 8.2 7.7  HGB 7.2* 10.6* 10.6*  HCT 23.0* 33.3* 33.4*  MCV 95.4 91.2 91.3  PLT 121* 120* 121*    Scheduled Meds: . sodium chloride   Intravenous Once  . darbepoetin (ARANESP) injection - DIALYSIS  200 mcg Intravenous Q Sat-HD  . dexamethasone  8 mg Oral Q breakfast  . famotidine  20 mg Oral Daily  . feeding supplement  1 Container Oral TID BM  . fluticasone  1 spray Each Nare BID  . levofloxacin  500 mg Oral Q48H  . midodrine  10 mg Oral TID WC  . multivitamin  1 tablet Oral QHS  . protein supplement  1 scoop Oral TID WC  . vitamin B-12  1,000 mcg Oral Daily  . Vitamin D (Ergocalciferol)  50,000 Units Oral Q7 days   Continuous Infusions:  PRN Meds:.sodium chloride, sodium chloride, methocarbamol (ROBAXIN)  IV, morphine injection, ondansetron (ZOFRAN) IV, oxyCODONE, polyethylene glycol, technetium TC 27M mebrofenin   Thomas Grippe  MD 07/27/2016, 8:20 AM

## 2016-07-27 NOTE — Progress Notes (Signed)
Patient voided at the beginning of the shift, 325cc. Patient has not voided again. NT bladder scanned pt and pt retaining 538cc. Pt is taken to HD. RN will inform Public house manager.  Ermalinda Memos, RN

## 2016-07-27 NOTE — Progress Notes (Signed)
Flushed biliary drain with 47m of NS, no resistance met.  MErmalinda Memos RN

## 2016-07-28 LAB — CBC WITH DIFFERENTIAL/PLATELET
BASOS PCT: 0 %
Basophils Absolute: 0 10*3/uL (ref 0.0–0.1)
EOS ABS: 0 10*3/uL (ref 0.0–0.7)
Eosinophils Relative: 0 %
HEMATOCRIT: 32.5 % — AB (ref 39.0–52.0)
HEMOGLOBIN: 10.1 g/dL — AB (ref 13.0–17.0)
LYMPHS ABS: 1.5 10*3/uL (ref 0.7–4.0)
Lymphocytes Relative: 22 %
MCH: 28.9 pg (ref 26.0–34.0)
MCHC: 31.1 g/dL (ref 30.0–36.0)
MCV: 92.9 fL (ref 78.0–100.0)
Monocytes Absolute: 0.4 10*3/uL (ref 0.1–1.0)
Monocytes Relative: 5 %
NEUTROS ABS: 5 10*3/uL (ref 1.7–7.7)
NEUTROS PCT: 73 %
Platelets: 118 10*3/uL — ABNORMAL LOW (ref 150–400)
RBC: 3.5 MIL/uL — AB (ref 4.22–5.81)
RDW: 15.7 % — ABNORMAL HIGH (ref 11.5–15.5)
WBC: 6.9 10*3/uL (ref 4.0–10.5)

## 2016-07-28 LAB — BASIC METABOLIC PANEL
Anion gap: 9 (ref 5–15)
BUN: 16 mg/dL (ref 6–20)
CHLORIDE: 99 mmol/L — AB (ref 101–111)
CO2: 27 mmol/L (ref 22–32)
Calcium: 8 mg/dL — ABNORMAL LOW (ref 8.9–10.3)
Creatinine, Ser: 1.86 mg/dL — ABNORMAL HIGH (ref 0.61–1.24)
GFR calc non Af Amer: 36 mL/min — ABNORMAL LOW (ref >60)
GFR, EST AFRICAN AMERICAN: 41 mL/min — AB (ref >60)
Glucose, Bld: 375 mg/dL — ABNORMAL HIGH (ref 65–99)
Potassium: 4.5 mmol/L (ref 3.5–5.1)
SODIUM: 135 mmol/L (ref 135–145)

## 2016-07-28 LAB — GLUCOSE, CAPILLARY
GLUCOSE-CAPILLARY: 387 mg/dL — AB (ref 65–99)
Glucose-Capillary: 336 mg/dL — ABNORMAL HIGH (ref 65–99)

## 2016-07-28 MED ORDER — INSULIN ASPART 100 UNIT/ML ~~LOC~~ SOLN
0.0000 [IU] | Freq: Three times a day (TID) | SUBCUTANEOUS | Status: DC
  Administered 2016-07-28: 15 [IU] via SUBCUTANEOUS
  Administered 2016-07-29 (×2): 4 [IU] via SUBCUTANEOUS
  Administered 2016-07-30 (×2): 11 [IU] via SUBCUTANEOUS
  Administered 2016-07-30: 7 [IU] via SUBCUTANEOUS
  Administered 2016-07-31: 15 [IU] via SUBCUTANEOUS
  Administered 2016-07-31: 7 [IU] via SUBCUTANEOUS
  Administered 2016-07-31: 4 [IU] via SUBCUTANEOUS
  Administered 2016-08-01: 7 [IU] via SUBCUTANEOUS
  Administered 2016-08-02 (×2): 4 [IU] via SUBCUTANEOUS

## 2016-07-28 NOTE — Clinical Social Work Note (Addendum)
Clinical Social Work Assessment  Patient Details  Name: Thomas [name] MRN: [id number] Date of Birth: 01-Jul-1946  Date of referral:  07/27/16               Reason for consult:  Facility Placement                Permission sought to share information with:  Family Supports Permission granted to share information::  Yes, Verbal Permission Granted  Name::     Wife, Thomas [name] and Daughter Thomas [name]  Agency::     Relationship::  Wife and Daughter  Contact Information:  Wife, Thomas [name] - [phone] and daughter Thomas [name] [phone].  Housing/Transportation Living arrangements for the past 2 months:  Thomas [name] of Information:  Patient, Spouse Patient Interpreter Needed:  None Criminal Activity/Legal Involvement Pertinent to Current Situation/Hospitalization:  No - Comment as needed Significant Relationships:  Adult Children, Spouse, Other Family Members Lives with:  Spouse Do you feel safe going back to the place where you live?  Yes (Patient feels safe at home and would prefer to discharge back home if he can get out of bed without being dizzy) Need for family participation in patient care:  Yes (Comment)  Care giving concerns:  On 8/17 patient and wife very concerned about his blood pressure dropping when he stands up and does not want to be discharged until the doctors figure out why this is happening. Mr. Thomas [name] that his blood pressure dropping as it does makes him scared to stand up. CSW listened attentively and expressed understanding of patient's concerns.   Social Worker assessment / plan:  On 8/17 CSW talked with patient and wife at the bedside regarding discharge planning and recommendation of ST rehab at a skilled facility.  Patient willing to consider SNF for ST rehab but right now his main concern is what's wrong with him.  Mrs. Thomas [name] indicated that their facility preferences are [facility] or [facility].   Mr. Thomas [name] very concerned  about his blood pressure dropping and feels that if he can stand without it dropping, he can go home. Patient's wife reported that before patient came to the hospital he was "getting around good" and PT was coming out to see him twice a week. Mr. Thomas [name] expressed that he has good family support including his wife and family that live nearby.  Employment status:  Retired Forensic scientist:  [employer]) PT Recommendations:    Information / Referral to community resources:  Acute Rehab, [facility] (Pt recommending supervision/assistance-24 hr and OT recommending CIR.)  Patient/Family's Response to care:  Patient really wants to find out what is wrong with him and wants answers from the doctors.  Patient/Family's Understanding of and Emotional Response to Diagnosis, Current Treatment, and Prognosis:  Patient very concerned about finding out what is wrong with him.  Emotional Assessment Appearance:  Appears stated age Attitude/Demeanor/Rapport:  Other (Appropriate) Affect (typically observed):  Appropriate, Pleasant Orientation:  Oriented to Self, Oriented to Place, Oriented to  Time, Oriented to Situation Alcohol / Substance use:  Tobacco Use, Alcohol Use, Illicit Drugs (Patient reported that he quit smoking, and does not drink alcohol or use illicit drugs.) Psych involvement (Current and /or in the community):  No (Comment)  Discharge Needs  Concerns to be addressed:  Discharge Planning Concerns (CIR/SNF/Home) Readmission within the last 30 days:  No Current discharge risk:  None Barriers to Discharge:  Continued Medical Work up  Sable Feil, LCSW 07/28/2016, 5:59 PM

## 2016-07-28 NOTE — Progress Notes (Signed)
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 He presented on  8/3 with one-day history of nausea, vomiting and abdominal pain Pt did have percutaneous cholecystomy drain placed 8/7.      PT Comments    Patient stronger today, making improvements toward goals.  Would benefit from CIR review now that patient making improvements.  Follow Up Recommendations  Supervision/Assistance - 24 hour;Other (comment) (Consider CIR )     Equipment Recommendations  None recommended by PT    Recommendations for Other Services Other (comment) (Worth considering Rehab review again)     Precautions / Restrictions Precautions Precautions: Fall Precaution Comments: Watch orthostatics; Biliary tube R side Restrictions Weight Bearing Restrictions: No    Mobility  Bed Mobility Overal bed mobility: Needs Assistance Bed Mobility: Supine to Sit     Supine to sit: Min guard     General bed mobility comments: Patient able to move to sitting with no physical assist.  Good balance in sitting.  Transfers Overall transfer level: Needs assistance Equipment used: Rolling walker (2 wheeled) Transfers: Sit to/from [facility] Sit to Stand: Min assist;From elevated surface;+2 safety/equipment Stand pivot transfers: Min assist;+2 safety/equipment       General transfer comment: Verbal cues for hand placement.  Patient preferred to keep hands on RW.  Bed elevated to height of bed at home.  Min assist to power up to standing.  In stance, patient performed shallow knee  bends x 5 reps.  Patient then took several shuffle steps to pivot to chair with min assist to steady.  Assist to control descent into chair.  Required +2 assist for safety.  Ambulation/Gait                 Stairs            Wheelchair Mobility    Modified Rankin (Stroke Patients Only)       Balance Overall balance assessment: Needs assistance Sitting-balance support: No upper extremity supported;Feet supported Sitting balance-Leahy Scale: Good     Standing balance support: Bilateral upper extremity supported Standing balance-Leahy Scale: Poor Standing balance comment: Requires UE support for balance.                    Cognition Arousal/Alertness: Awake/alert Behavior During Therapy: WFL for tasks assessed/performed Overall Cognitive Status: Within Functional Limits for tasks assessed                      Exercises General Exercises - Lower Extremity Long Arc Quad: AROM;Both;10 reps;Seated Hip Flexion/Marching: AROM;Both;10 reps;Seated Toe Raises: AROM;Both;10 reps;Seated Heel Raises: AROM;Both;10 reps;Seated Mini-Sqauts: AROM;Both;5 reps;Standing    General Comments        Pertinent Vitals/Pain Pain Assessment: No/denies pain    Home Living                      Prior Function            PT Goals (current goals can now be found in the care plan section) Progress towards PT goals: Progressing toward goals    Frequency  Min 3X/week    PT Plan Current plan remains appropriate    Co-evaluation             End of Session Equipment Utilized During Treatment: Gait belt Activity Tolerance: Patient limited by fatigue Patient left: in chair;with call bell/phone within reach     Time: 1113-1131 PT Time Calculation (min) (ACUTE ONLY): 18 min  Charges:  $Therapeutic Activity: 8-22 mins                    G Codes:      Despina Pole 2016/08/15, 12:25 PM Carita Pian. Sanju[place] Kava, [place] Pager  [phone]

## 2016-07-28 NOTE — Progress Notes (Signed)
[facility] Progress Note   Subjective:  "I feel a lot better.Marland KitchenMarland KitchenI'm eating all I can. I just don't know about going home. I can't walk unless there are two people with me because this tube in my side hurts.   Wife at bedside. NAD  Objective Vitals:   07/27/16 1756 07/27/16 2100 07/28/16 0552 07/28/16 0830  BP: 131/65 (!) 116/59 131/68 (!) 144/71  Pulse: 70 77 77 79  Resp: 17 17 18 18   Temp: 97.7 F (36.5 C) 97.4 F (36.3 C) 97.9 F (36.6 C) 97.7 F (36.5 C)  TempSrc: Oral Oral Oral Oral  SpO2: 99% 100% 97% 97%  Weight:  70.5 kg (155 lb 6.8 oz)    Height:       Physical Exam General: Awake, pleasant, NAD Heart: S1, S2, RRR Lungs: BBS CTA A/P Abdomen: soft nontender. RUG perc. Bilary drain with sm. Amt bilious drainage.  Extremities: No LE edema. SCDs in use.  Dialysis Access: RIJ perm cath drsg CDI/Maturing L BC AVF + bruit   Dialysis Orders: TTS SW 4h 73kg 3K/ 2.25 bath Hep 7300 R IJ cath/ maturing L BCF (05/24/16) Aranesp 40 g weekly pth 133 no vit d   Additional Objective Labs: Basic Metabolic Panel:  Recent Labs Lab 07/25/16 0705 07/26/16 0635 07/27/16 0650 07/28/16 0316  NA 135  136 133* 134* 135  K 4.1  4.1 4.1 4.1 4.5  CL 101  99* 98* 98* 99*  CO2 29  29 27 26 27   GLUCOSE 107*  113* 232* 276* 375*  BUN 31*  30* 17 25* 16  CREATININE 3.13*  3.19* 2.07* 2.50* 1.86*  CALCIUM 7.9*  8.1* 7.9* 8.0* 8.0*  PHOS 4.1  --  4.2  --    Liver Function Tests:  Recent Labs Lab 07/25/16 0705 07/27/16 0650  ALBUMIN 1.7* 1.8*   CBC:  Recent Labs Lab 07/24/16 0444 07/25/16 0705 07/26/16 0635 07/27/16 0650 07/28/16 0316  WBC 9.4 7.4 8.2 7.7 6.9  NEUTROABS  --   --   --   --  5.0  HGB 7.4* 7.2* 10.6* 10.6* 10.1*  HCT 23.4* 23.0* 33.3* 33.4* 32.5*  MCV 94.7 95.4 91.2 91.3 92.9  PLT 115* 121* 120* 121* 118*   Blood Culture    Component Value Date/Time   SDES DRAINAGE BILE 07/17/2016 1718   SPECREQUEST Normal  07/17/2016 1718   CULT  07/17/2016 1718    MODERATE ENTEROBACTER CLOACAE MODERATE KLEBSIELLA PNEUMONIAE NO ANAEROBES ISOLATED    REPTSTATUS 07/22/2016 FINAL 07/17/2016 1718    Iron Studies:  Recent Labs  07/25/16 1429  TRANSFERRIN 90*   @lablastinr3 @ Studies/Results: No results found. Medications:   . sodium chloride   Intravenous Once  . darbepoetin (ARANESP) injection - DIALYSIS  200 mcg Intravenous Q Sat-HD  . dexamethasone  8 mg Oral Q breakfast  . famotidine  20 mg Oral Daily  . feeding supplement  1 Container Oral TID BM  . fluticasone  1 spray Each Nare BID  . lactobacillus acidophilus & bulgar  1 tablet Oral TID WC  . levofloxacin  500 mg Oral Q48H  . midodrine  10 mg Oral TID WC  . multivitamin  1 tablet Oral QHS  . protein supplement  1 scoop Oral TID WC  . vitamin B-12  1,000 mcg Oral Daily  . Vitamin D (Ergocalciferol)  50,000 Units Oral Q7 days     Assessment/Plan: 1.Abdominal pain/nausea/vomiting: cholecystitis-sp IR perc chole drainage 8/07 Blood Cx:1/2 klebsiellapneumoniae. On Levaquin po  x 3wk. Doing better with nausea, appetite good.  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 Failed trial of holding dialysis. Cont TTS. Last HD 08/17. Will had HD tomorrow on schedule. K+ 4.5. Renal panel prior to HD. Has been using 3.0 K bath.  3. Anemia: Secondary to ESRD/multiple myeloma-/ Infection - max ESA/  2u PRBC 8/15 @ HD. HGB 10.1 today.  4.Multiple myeloma:on chemotherapy/radiation to left femur-further management per Dr. 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 No binders/VDRA. Renal profile tomorrow with HD.  6.Volume/HTN  - low bp's on midodrine,/  under dry wt, his  periph edema is resolved. Still makes some urine. HD yesterday, pre wt 70.4 kg net UF 500 post wt 70.5. BP stable today. Will need EDW adjusted upon DC. 0.5-1 kg tomorrow.  7. Debility -Apparently someone mentioned going home and  patient stayed awake all night worried that wife would not be able to take care of him. Needs rehab ? Inpatient vs SNF. Is working with PT however names pain at [employer] drain limiting factor in movement.  8. Nutrition: Albumin 1.8 on reg diet. Phos OK> renal vit/protein supplement. 9. Vascular access- LUE BVT 1st stage (placed 05/24/16), +T/B, maturing.  He missed his outpatient appt with Dr. Bridgett Larsson.  If he remains in hospital we may have him check on fistula and schedule second stage of BVT.    Rita H. Brown NP-C 07/28/2016, 10:07 AM  [employer] [phone]     I have seen and examined this patient and agree with plan and assessment in the above note with renal recommendations/intervention highlighted. Eating a little better.  Will continue to follow.  AVF first stage of BVT and will need to await second stage once cholecystostomy tube is removed and he continues to improve.  Broadus John A Neng Albee,MD 07/28/2016 1:09 PM

## 2016-07-28 NOTE — NC FL2 (Signed)
[place] LEVEL OF CARE SCREENING TOOL     IDENTIFICATION  Patient Name: Thomas [name] Birthdate: 24-Aug-1946 Sex: male Admission Date (Current Location): 07/13/2016  [facility] and [place] Number:  Herbalist and Address:  [facility], [place] [address] [place], [place] [address]      Provider Number: [id number]  Attending Physician Name and Address:  Elwin Mocha, MD  Relative Name and Phone Number:  Javarian Jakubiak - wife.  Phone [phone] (c)    Current Level of Care: Hospital Recommended Level of Care: [place] Prior Approval Number:    Date Approved/Denied:   PASRR Number: [id number] (Eff. 05/17/16))  Discharge Plan: SNF    Current Diagnoses: Patient Active Problem List   Diagnosis Date Noted  . Acalculous cholecystitis   . Elevated LFTs   . Acute respiratory failure with hypoxia ([place])   . Abdominal pain 07/13/2016  . N&V (nausea and vomiting) 07/13/2016  . Sepsis ([place]) 07/13/2016  . Pleural effusion   . RUQ pain   . Anemia of renal disease 07/04/2016  . Diarrhea 07/04/2016  . Sinusitis nasal 07/03/2016  . Conjunctivitis of both eyes 07/03/2016  . ESRD (end stage renal disease) on dialysis ([place]) 06/12/2016  . Protein-calorie malnutrition, moderate ([place]) 06/12/2016  . Constipation   . 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 Physical deconditioning   . Closed fracture of shaft of left femur ([place])   . Adjustment disorder with mixed anxiety and depressed mood   . Postoperative pain   . Bilateral low back pain without sciatica   . Pressure ulcer   . Thrombocytopenia ([place])   . Arterial hypotension   . Functional diarrhea   . Anemia of chronic disease   . Multiple myeloma not having achieved remission ([place])   . Antineoplastic chemotherapy induced pancytopenia ([place])   . Low back pain   . Post-op pain   . Surgery, elective   . Acute blood loss anemia   . ESRD on dialysis ([place])   . Essential hypertension   .  Urinary tract infection, site not specified   . Vitamin B12 deficiency   . Multiple myeloma ([place]) 05/14/2016  . Acute renal failure ([place])   . Pathological fracture of left femur due to neoplastic disease ([place])   . Anemia due to bone marrow failure ([place])   . 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 Occult blood positive stool 05/10/2016  . Malnutrition of moderate degree 05/10/2016    Orientation RESPIRATION BLADDER Height & Weight     Self, Time, Situation, Place  Normal Continent Weight: 155 lb 6.8 oz (70.5 kg) Height:  6' 1"  (185.4 cm)  BEHAVIORAL SYMPTOMS/MOOD NEUROLOGICAL BOWEL NUTRITION STATUS      Continent Diet (Regualr diet)  AMBULATORY STATUS COMMUNICATION OF NEEDS Skin   Total Care (Patient was unable to ambulate with PT on 07/28/16) Verbally Other (Comment) (Stage 2 pressure ulcer to mid coccyx.)                       Personal Care Assistance Level of Assistance  Bathing, Feeding, Dressing Bathing Assistance: Limited assistance Feeding assistance: Independent Dressing Assistance: Limited assistance     Functional Limitations Info  Sight, Hearing, Speech Sight Info: Adequate Hearing Info: Adequate Speech Info: Adequate    SPECIAL CARE FACTORS FREQUENCY  PT (By licensed PT), OT (By licensed OT)     PT Frequency: Evaluated on 8/8 and a minimum of 3X  per week therapy recommended OT Frequency: Evaluated 8/8 and a minimum of 2X per week therapy recommended            Contractures Contractures Info: Not present    Additional Factors Info  Code Status, Allergies, Insulin Sliding Scale Code Status Info: Full Allergies Info: Dilaudid, Codeine, Penicillins   Insulin Sliding Scale Info: 0-20 Units 3X per day with meals       Current Medications (07/28/2016):  This is the current hospital active medication list Current Facility-Administered Medications  Medication Dose Route Frequency Provider Last Rate Last Dose  . 0.9 %  sodium chloride infusion  100  mL Intravenous PRN Corliss Parish, MD      . 0.9 %  sodium chloride infusion  100 mL Intravenous PRN Corliss Parish, MD      . 0.9 %  sodium chloride infusion   Intravenous Once [employer], DO      . Darbepoetin Alfa (ARANESP) injection 200 mcg  200 mcg Intravenous Q Sat-HD Corliss Parish, MD   200 mcg at 07/22/16 1107  . dexamethasone (DECADRON) tablet 8 mg  8 mg Oral Q breakfast Heath Lark, MD   8 mg at 07/28/16 0859  . famotidine (PEPCID) tablet 20 mg  20 mg Oral Daily Reyne Dumas, MD   20 mg at 07/28/16 0900  . feeding supplement (BOOST / RESOURCE BREEZE) liquid 1 Container  1 Container Oral TID BM Maryann Mikhail, DO      . fluticasone (FLONASE) 50 MCG/ACT nasal spray 1 spray  1 spray Each Nare BID Maryann Mikhail, DO   1 spray at 07/28/16 0901  . insulin aspart (novoLOG) injection 0-20 Units  0-20 Units Subcutaneous TID WC Elwin Mocha, MD      . lactobacillus acidophilus & bulgar (LACTINEX) chewable tablet 1 tablet  1 tablet Oral TID WC Elwin Mocha, MD   1 tablet at 07/28/16 1210  . levofloxacin (LEVAQUIN) tablet 500 mg  500 mg Oral Q48H Wendee Beavers, RPH   500 mg at 07/28/16 1209  . methocarbamol (ROBAXIN) 500 mg in dextrose 5 % 50 mL IVPB  500 mg Intravenous Q8H PRN Reyne Dumas, MD   500 mg at 07/24/16 1934  . midodrine (PROAMATINE) tablet 10 mg  10 mg Oral TID WC Reyne Dumas, MD   10 mg at 07/28/16 1209  . morphine 2 MG/ML injection 1 mg  1 mg Intravenous Q6H PRN Maryann Mikhail, DO   1 mg at 07/21/16 1422  . multivitamin (RENA-VIT) tablet 1 tablet  1 tablet Oral QHS Roney Jaffe, MD   1 tablet at 07/27/16 2314  . ondansetron (ZOFRAN) injection 4 mg  4 mg Intravenous Q6H PRN Maryann Mikhail, DO      . oxyCODONE (Oxy IR/ROXICODONE) immediate release tablet 5 mg  5 mg Oral Q4H PRN Maryann Mikhail, DO   5 mg at 07/26/16 1455  . polyethylene glycol (MIRALAX / GLYCOLAX) packet 17 g  17 g Oral Daily PRN Maryann Mikhail, DO      . protein supplement (RESOURCE  BENEPROTEIN) powder 6 g  1 scoop Oral TID WC Darci Current Simaan, PA-C   6 g at 07/28/16 1000  . technetium TC 40M mebrofenin (CHOLETEC) injection 5 millicurie  5 millicurie Intravenous Once PRN Jeb Levering, MD      . vitamin B-12 (CYANOCOBALAMIN) tablet 1,000 mcg  1,000 mcg Oral Daily Roney Jaffe, MD   1,000 mcg at 07/28/16 0900  . Vitamin D (Ergocalciferol) (DRISDOL) capsule 50,000 Units  50,000 Units Oral Q7 days Roney Jaffe, MD   50,000 Units at 07/27/16 1159     Discharge Medications: Please see discharge summary for a list of discharge medications.  Relevant Imaging Results:  Relevant Lab Results:   Additional Information [id number].  Dialysis patient [facility] at [facility].   Sable Feil, LCSW

## 2016-07-28 NOTE — Progress Notes (Signed)
RN flushed biliary tube with 5cc NS, no resistance met.  Ermalinda Memos, RN

## 2016-07-28 NOTE — Clinical Social Work Placement (Signed)
   CLINICAL SOCIAL WORK PLACEMENT  NOTE  Date:  07/28/2016  Patient Details  Name: Thomas [name] MRN: [id number] Date of Birth: 02-21-1946  Clinical Social Work is seeking post-discharge placement for this patient at the [place] level of care (*CSW will initial, date and re-position this form in  chart as items are completed):  Yes   Patient/family provided with [place] Work Department's list of facilities offering this level of care within the geographic area requested by the patient (or if unable, by the patient's family).  Yes   Patient/family informed of their freedom to choose among providers that offer the needed level of care, that participate in Medicare, Medicaid or managed care program needed by the patient, have an available bed and are willing to accept the patient.  Yes   Patient/family informed of [facility]'s ownership interest in [facility] and [facility], as well as of the fact that they are under no obligation to receive care at these facilities.  PASRR submitted to EDS on       PASRR number received on       Existing PASRR number confirmed on 07/28/16     FL2 transmitted to all facilities in geographic area requested by pt/family on 07/28/16     FL2 transmitted to all facilities within larger geographic area on       Patient informed that his/her managed care company has contracts with or will negotiate with certain facilities, including the following:            Patient/family informed of bed offers received.  Patient chooses bed at       Physician recommends and patient chooses bed at      Patient to be transferred to   on  .  Patient to be transferred to facility by       Patient family notified on   of transfer.  Name of family member notified:        PHYSICIAN       Additional Comment:    _______________________________________________ Sable Feil, LCSW 07/28/2016, 6:12 PM

## 2016-07-28 NOTE — Progress Notes (Signed)
Inpatient Diabetes Program Recommendations  AACE/ADA: New Consensus Statement on Inpatient Glycemic Control (2015)  Target Ranges:  Prepandial:   less than 140 mg/dL      Peak postprandial:   less than 180 mg/dL (1-2 hours)      Critically ill patients:  140 - 180 mg/dL   Results for ELISA, JEANNOT (MRN [id number]) as of 07/28/2016 10:48  Ref. Range 07/26/2016 06:35 07/27/2016 06:50 07/28/2016 03:16  Glucose Latest Ref Range: 65 - 99 mg/dL 232 (H) 276 (H) 375 (H)   Review of Glycemic Control  Diabetes history: No Outpatient Diabetes medications: None Current orders for Inpatient glycemic control: None  Inpatient Diabetes Program Recommendations: Correction (SSI): Noted lab glucose 375 mg/dl today. While inpatient and receiving steroids please order CBGs with Novolog correction scale ACHS.  Thanks, Barnie Alderman, RN, MSN, CDE Diabetes Coordinator Inpatient Diabetes Program [phone] (Team Pager from [place] to [place]) [phone] ([facility] office) [phone] [facility] office) [phone] [facility] office)

## 2016-07-28 NOTE — Progress Notes (Signed)
PROGRESS NOTE    Thomas [name]  JJO:[id number] DOB: 05/08/1946 DOA: 07/13/2016 PCP: No PCP Per Patient   Chief Complaint  Patient presents with  . Abdominal Pain  . abnormal labs  . Leg Pain    Brief Narrative:  70 y.o. M with recent diagnosis of MM (end of April 2017), currently undergoing chemo and radiation.  Started on new chemo drug last week (7/31) and developed significant diarrhea afterwards.  Had symptoms for 3 - 4 days then on 8/3 began to have N/V and abd pain.  Found to have possible acalculous cholecystitis.  PCCM asked to admit to ICU due to hypotension.  Barrier to discharge her physical activity and sitting up with hemodialysis. Patient was given 2 units packed red blood cells yesterday with dialysis. Also started on steroids. Asked CIR to evaluate patient however they feel that patient should be discharged to skilled nursing facility. Patient family adamant about patient going to inpatient rehabilitation. On recent evaluation patient is a lot stronger PT and OT recommending inpatient rehabilitation. Reconsulted inpatient rehabilitation team.  Assessment & Plan   Hypotension -Patient did require vasopressors while in the ICU -Resolved, currently on midodrine   Loose stools - likely related to diet, chemo or abx induced - checked C diff to be thorough - neg - probiotic  Sepsis secondary to Klebsiella pneumoniae bacteremia -blood cultures from 07/13/2016 +kleb pneumoniae -MRI/MRCP showed findings c/w cholecystitis, no stones, no CBD stones along with asc and des colitis -Patient evaluated by general surgery initially, for abnormal liver function, possible CBD stone. -Presentation was concerning for acalculous cholecystitis. Surgery recommended percutaneous drain which was placed by IR on 07/17/2016 -Sepsis physiology improving -vancomycin discontinued 8/5, also discontinued Flagyl and aztreonam -Transitioned to levofloxacin for a total of 3 weeks for Klebsiella pneumonia  bacteremia  -Remains afebrile with no leukocytosis  Anemia/Thrombocytopenia/Multiple myeloma -in the setting of multiple myeloma, received 2 units of packed red blood cell transfusions prior to IR procedure -Oncology consulted and appreciated -Serum light chain has dropped significantly to near normal ratio -Due to recent pancytopenia, treatment is placed on hold -Pancytopenia has improved since discontinuation of Revlimid.  HIT panel negative  -Anemia is related to chronic renal failure. He is getting ESA through dialysis center -07/24/16: Spoke with Dr. Alvy Bimler (onc), recommended dexamethasone '8mg'$  for 2 days -[facility] ordered for transfusion with hemodialysis on 07/25/2016  Transaminitis/cholecystitis -Improving, passed biliary stone, s/p Percutaneous cholecystostomy drain placed 8/7 in IR -Gastroenterology, IR, general surgery consulted and appreciated -Drain to remain 6-8 weeks -Continue pain control as needed  End-stage renal disease -Secondary to multiple myeloma and getting hemodialysis via right IJ tunneled dialysis catheter -Nephrology consulted and appreciated -Creatinine today 3.19 -Per nephrology, Patient failed a trial of holding dialysis  patient to resume HD TTS   Multiple myeloma -On chemotherapy/radiation to left femur-further management per hematology -Due to recent pancytopenia, treatment is placed on hold -Continue aggressive supportive care -He will need follow up appointment with Dr. Alvy Bimler -Gave steroids and 2uPRBCs  Goals of care -Discussed possible palliative care consult with patient and family- for pain and symptom management, they declined at this time. 8/16 - discussed d/c with family and pt at length  Deconditioning -PT consulted, recommended IP rehab -OT eval recommended CIR -Asked CIR to evaluate patient however they feel patient would be better suited for skilled nursing facility at this time -Spoke with patient and wife regarding the  nursing facility, both refused. -Assume patient needs to dialyze sitting up in order to  repair him for discharge.   DVT Prophylaxis  SCDs  Code Status: Full  Family Communication: Wife at bedside.  Disposition Plan: Admitted. Suspect discharge the next several days.  Consultants Nephrology  General surgery Interventional radiology PCCM IPR  Procedures  Abdominal US Percutaneous cholecystostomy drain placement  Antibiotics   Anti-infectives    Start     Dose/Rate Route Frequency Ordered Stop   07/20/16 1200  levofloxacin (LEVAQUIN) tablet 500 mg     500 mg Oral Every 48 hours 07/18/16 1113     07/18/16 1200  levofloxacin (LEVAQUIN) tablet 750 mg     750 mg Oral  Once 07/18/16 1113 07/18/16 1309   07/17/16 1800  vancomycin (VANCOCIN) IVPB 1000 mg/200 mL premix  Status:  Discontinued     1,000 mg 200 mL/hr over 60 Minutes Intravenous To Radiology 07/17/16 1701 07/18/16 1108   07/15/16 1800  aztreonam (AZACTAM) 1 g in dextrose 5 % 50 mL IVPB  Status:  Discontinued     1 g 100 mL/hr over 30 Minutes Intravenous Every 24 hours 07/14/16 1232 07/18/16 1108   07/15/16 1200  vancomycin (VANCOCIN) IVPB 750 mg/150 ml premix  Status:  Discontinued     750 mg 150 mL/hr over 60 Minutes Intravenous Every T-Th-Sa (Hemodialysis) 07/14/16 1232 07/15/16 1445   07/14/16 1900  vancomycin (VANCOCIN) IVPB 750 mg/150 ml premix  Status:  Discontinued     750 mg 150 mL/hr over 60 Minutes Intravenous Every 24 hours 07/13/16 1740 07/14/16 1232   07/14/16 1800  fluconazole (DIFLUCAN) IVPB 200 mg  Status:  Discontinued     200 mg 100 mL/hr over 60 Minutes Intravenous Every 24 hours 07/13/16 1701 07/14/16 0949   07/14/16 1500  levofloxacin (LEVAQUIN) IVPB 500 mg  Status:  Discontinued     500 mg 100 mL/hr over 60 Minutes Intravenous Every 48 hours 07/13/16 1731 07/14/16 0949   07/13/16 2200  metroNIDAZOLE (FLAGYL) IVPB 500 mg  Status:  Discontinued     500 mg 100 mL/hr over 60 Minutes Intravenous  Every 8 hours 07/13/16 1732 07/17/16 1409   07/13/16 1745  aztreonam (AZACTAM) 1 g in dextrose 5 % 50 mL IVPB  Status:  Discontinued     1 g 100 mL/hr over 30 Minutes Intravenous Every 8 hours 07/13/16 1731 07/14/16 1232   07/13/16 1730  fluconazole (DIFLUCAN) IVPB 400 mg     400 mg 100 mL/hr over 120 Minutes Intravenous  Once 07/13/16 1701 07/13/16 2251   07/13/16 1700  vancomycin (VANCOCIN) 1,500 mg in sodium chloride 0.9 % 500 mL IVPB     1,500 mg 250 mL/hr over 120 Minutes Intravenous  Once 07/13/16 1651 07/13/16 1951   07/13/16 1445  ciprofloxacin (CIPRO) IVPB 400 mg     400 mg 200 mL/hr over 60 Minutes Intravenous  Once 07/13/16 1434 07/13/16 1555   07/13/16 1445  metroNIDAZOLE (FLAGYL) IVPB 500 mg     500 mg 100 mL/hr over 60 Minutes Intravenous  Once 07/13/16 1434 07/13/16 1721      Subjective:   [name] seen and examined today.Feels Better today. Wants d/c to CIR. No loose stools today per  Objective:   Vitals:   07/27/16 1756 07/27/16 2100 07/28/16 0552 07/28/16 0830  BP: 131/65 (!) 116/59 131/68 (!) 144/71  Pulse: 70 77 77 79  Resp: _0 Temp: 97.7 F (36.5 C) 97.4 F (36.3 C) 97.9 F (36.6 C) 97.7 F (36.5 C)  TempSrc: Oral Oral Oral Oral  SpO2: 99% 100% 97% 97%  Weight:  70.5 kg (155 lb 6.8 oz)    Height:        Intake/Output Summary (Last 24 hours) at 07/28/16 1451 Last data filed at 07/28/16 0900  Gross per 24 hour  Intake              420 ml  Output              450 ml  Net              -30 ml   Filed Weights   07/27/16 0640 07/27/16 1018 07/27/16 2100  Weight: 69.8 kg (153 lb 14.1 oz) 70.4 kg (155 lb 3.3 oz) 70.5 kg (155 lb 6.8 oz)    Exam  General: Well developed, elderly, Appears better than yesterday, NAD  HEENT: NCAT,  mucous membranes moist.   Cardiovascular: S1 S2 auscultated, RRR  Respiratory: Clear to auscultation bilaterally with equal chest rise  Abdomen: Soft, improving RUQ TTP/drain in place, nondistended, +  bowel sounds  Extremities: warm dry without cyanosis clubbing. +LE edema. LUE AVF  Neuro: AAOx3, nonfocal  Psych: Appropriate mood and affect, pleasant  Data Reviewed: I have personally reviewed following labs and imaging studies  CBC:  Recent Labs Lab 07/24/16 0444 07/25/16 0705 07/26/16 0635 07/27/16 0650 07/28/16 0316  WBC 9.4 7.4 8.2 7.7 6.9  NEUTROABS  --   --   --   --  5.0  HGB 7.4* 7.2* 10.6* 10.6* 10.1*  HCT 23.4* 23.0* 33.3* 33.4* 32.5*  MCV 94.7 95.4 91.2 91.3 92.9  PLT 115* 121* 120* 121* 675*   Basic Metabolic Panel:  Recent Labs Lab 07/24/16 0444 07/25/16 0705 07/26/16 0635 07/27/16 0650 07/28/16 0316  NA 135 135  136 133* 134* 135  K 4.1 4.1  4.1 4.1 4.1 4.5  CL 99* 101  99* 98* 98* 99*  CO2 _0 GLUCOSE 128* 107*  113* 232* 276* 375*  BUN 24* 31*  30* 17 25* 16  CREATININE 2.61* 3.13*  3.19* 2.07* 2.50* 1.86*  CALCIUM 8.0* 7.9*  8.1* 7.9* 8.0* 8.0*  PHOS  --  4.1  --  4.2  --    GFR: Estimated Creatinine Clearance: 37.9 mL/min (by C-G formula based on SCr of 1.86 mg/dL). Liver Function Tests:  Recent Labs Lab 07/25/16 0705 07/27/16 0650  ALBUMIN 1.7* 1.8*   No results for input(s): LIPASE, AMYLASE in the last 168 hours. No results for input(s): AMMONIA in the last 168 hours. Coagulation Profile: No results for input(s): INR, PROTIME in the last 168 hours. Cardiac Enzymes: No results for input(s): CKTOTAL, CKMB, CKMBINDEX, TROPONINI in the last 168 hours. BNP (last 3 results) No results for input(s): PROBNP in the last 8760 hours. HbA1C: No results for input(s): HGBA1C in the last 72 hours. CBG: No results for input(s): GLUCAP in the last 168 hours. Lipid Profile: No results for input(s): CHOL, HDL, LDLCALC, TRIG, CHOLHDL, LDLDIRECT in the last 72 hours. Thyroid Function Tests: No results for input(s): TSH, T4TOTAL, FREET4, T3FREE, THYROIDAB in the last 72 hours. Anemia Panel: No results for input(s):  VITAMINB12, FOLATE, FERRITIN, TIBC, IRON, RETICCTPCT in the last 72 hours. Urine analysis:    Component Value Date/Time   COLORURINE YELLOW 05/21/2016 1531   APPEARANCEUR CLOUDY (A) 05/21/2016 1531   LABSPEC 1.008 05/21/2016 1531   PHURINE 7.5 05/21/2016 1531   GLUCOSEU NEGATIVE 05/21/2016 1531   HGBUR LARGE (A) 05/21/2016 1531  BILIRUBINUR NEGATIVE 05/21/2016 1531   KETONESUR NEGATIVE 05/21/2016 1531   PROTEINUR 100 (A) 05/21/2016 1531   NITRITE NEGATIVE 05/21/2016 1531   LEUKOCYTESUR NEGATIVE 05/21/2016 1531   Sepsis Labs: _0 (procalcitonin:4,lacticidven:4)  ) Recent Results (from the past 240 hour(s))  C difficile quick scan w PCR reflex     Status: None   Collection Time: 07/27/16  5:56 PM  Result Value Ref Range Status   C Diff antigen NEGATIVE NEGATIVE Final   C Diff toxin NEGATIVE NEGATIVE Final   C Diff interpretation No C. difficile detected.  Final      Radiology Studies: No results found.   Scheduled Meds: . sodium chloride   Intravenous Once  . darbepoetin (ARANESP) injection - DIALYSIS  200 mcg Intravenous Q Sat-HD  . dexamethasone  8 mg Oral Q breakfast  . famotidine  20 mg Oral Daily  . feeding supplement  1 Container Oral TID BM  . fluticasone  1 spray Each Nare BID  . insulin aspart  0-20 Units Subcutaneous TID WC  . lactobacillus acidophilus & bulgar  1 tablet Oral TID WC  . levofloxacin  500 mg Oral Q48H  . midodrine  10 mg Oral TID WC  . multivitamin  1 tablet Oral QHS  . protein supplement  1 scoop Oral TID WC  . vitamin B-12  1,000 mcg Oral Daily  . Vitamin D (Ergocalciferol)  50,000 Units Oral Q7 days   Continuous Infusions:    LOS: 15 days   Time Spent in minutes   30 minutes  Elwin Mocha MD on 07/28/2016 at 2:51 PM  Between 7am to [place]  After 7pm go to [email or web address] - password [email or web address]  And look for the night coverage person covering for me after hours  [facility] Office  [phone]

## 2016-07-29 LAB — BASIC METABOLIC PANEL
Anion gap: 6 (ref 5–15)
BUN: 27 mg/dL — AB (ref 6–20)
CHLORIDE: 100 mmol/L — AB (ref 101–111)
CO2: 26 mmol/L (ref 22–32)
CREATININE: 2.23 mg/dL — AB (ref 0.61–1.24)
Calcium: 8 mg/dL — ABNORMAL LOW (ref 8.9–10.3)
GFR calc Af Amer: 33 mL/min — ABNORMAL LOW (ref >60)
GFR, EST NON AFRICAN AMERICAN: 29 mL/min — AB (ref >60)
GLUCOSE: 181 mg/dL — AB (ref 65–99)
POTASSIUM: 4.1 mmol/L (ref 3.5–5.1)
SODIUM: 132 mmol/L — AB (ref 135–145)

## 2016-07-29 LAB — CBC WITH DIFFERENTIAL/PLATELET
Basophils Absolute: 0 10*3/uL (ref 0.0–0.1)
Basophils Relative: 0 %
EOS ABS: 0 10*3/uL (ref 0.0–0.7)
EOS PCT: 0 %
HCT: 33.3 % — ABNORMAL LOW (ref 39.0–52.0)
Hemoglobin: 10.4 g/dL — ABNORMAL LOW (ref 13.0–17.0)
LYMPHS ABS: 2.2 10*3/uL (ref 0.7–4.0)
LYMPHS PCT: 25 %
MCH: 28.9 pg (ref 26.0–34.0)
MCHC: 31.2 g/dL (ref 30.0–36.0)
MCV: 92.5 fL (ref 78.0–100.0)
MONOS PCT: 6 %
Monocytes Absolute: 0.5 10*3/uL (ref 0.1–1.0)
Neutro Abs: 6.1 10*3/uL (ref 1.7–7.7)
Neutrophils Relative %: 69 %
PLATELETS: 97 10*3/uL — AB (ref 150–400)
RBC: 3.6 MIL/uL — ABNORMAL LOW (ref 4.22–5.81)
RDW: 15.1 % (ref 11.5–15.5)
WBC: 8.8 10*3/uL (ref 4.0–10.5)

## 2016-07-29 LAB — GLUCOSE, CAPILLARY
GLUCOSE-CAPILLARY: 190 mg/dL — AB (ref 65–99)
GLUCOSE-CAPILLARY: 261 mg/dL — AB (ref 65–99)
Glucose-Capillary: 111 mg/dL — ABNORMAL HIGH (ref 65–99)
Glucose-Capillary: 189 mg/dL — ABNORMAL HIGH (ref 65–99)

## 2016-07-29 MED ORDER — ALPRAZOLAM 0.25 MG PO TABS
0.2500 mg | ORAL_TABLET | Freq: Two times a day (BID) | ORAL | Status: DC | PRN
  Administered 2016-07-29 – 2016-07-31 (×4): 0.25 mg via ORAL
  Filled 2016-07-29 (×4): qty 1

## 2016-07-29 NOTE — Progress Notes (Signed)
RN flushed biliary drain with 5cc NS, no resistance met.  Ermalinda Memos, RN

## 2016-07-29 NOTE — Procedures (Signed)
I was present at this dialysis session. I have reviewed the session itself and made appropriate changes.   Filed Weights   07/27/16 1018 07/27/16 2100 07/28/16 2027  Weight: 70.4 kg (155 lb 3.3 oz) 70.5 kg (155 lb 6.8 oz) 72.4 kg (159 lb 11.2 oz)     Recent Labs Lab 07/27/16 0650 07/28/16 0316  NA 134* 135  K 4.1 4.5  CL 98* 99*  CO2 26 27  GLUCOSE 276* 375*  BUN 25* 16  CREATININE 2.50* 1.86*  CALCIUM 8.0* 8.0*  PHOS 4.2  --      Recent Labs Lab 07/27/16 0650 07/28/16 0316 07/29/16 0831  WBC 7.7 6.9 8.8  NEUTROABS  --  5.0 6.1  HGB 10.6* 10.1* 10.4*  HCT 33.4* 32.5* 33.3*  MCV 91.3 92.9 92.5  PLT 121* 118* 97*    Scheduled Meds: . sodium chloride   Intravenous Once  . darbepoetin (ARANESP) injection - DIALYSIS  200 mcg Intravenous Q Sat-HD  . dexamethasone  8 mg Oral Q breakfast  . famotidine  20 mg Oral Daily  . feeding supplement  1 Container Oral TID BM  . fluticasone  1 spray Each Nare BID  . insulin aspart  0-20 Units Subcutaneous TID WC  . lactobacillus acidophilus & bulgar  1 tablet Oral TID WC  . levofloxacin  500 mg Oral Q48H  . midodrine  10 mg Oral TID WC  . multivitamin  1 tablet Oral QHS  . protein supplement  1 scoop Oral TID WC  . vitamin B-12  1,000 mcg Oral Daily  . Vitamin D (Ergocalciferol)  50,000 Units Oral Q7 days   Continuous Infusions:  PRN Meds:.sodium chloride, sodium chloride, ALPRAZolam, methocarbamol (ROBAXIN)  IV, morphine injection, ondansetron (ZOFRAN) IV, oxyCODONE, polyethylene glycol, technetium TC 26M mebrofenin   Donetta Potts,  MD 07/29/2016, 8:49 AM

## 2016-07-29 NOTE — Progress Notes (Signed)
PROGRESS NOTE    Thomas [name]  OIN:[id number] DOB: Dec 25, 1945 DOA: 07/13/2016 PCP: No PCP Per Patient    Brief Narrative:  70 y.o.M with recent diagnosis of MM (end of April 2017), currently undergoing chemo and radiation. Started on new chemo drug last week (7/31) and developed significant diarrhea afterwards. Had symptoms for 3 - 4 days then on 8/3 began to have N/V and abd pain. Found to have possible acalculous cholecystitis. PCCM asked to admit to ICU due to hypotension.  Barrier to discharge her physical activity and sitting up with hemodialysis. Patient was given 2 units packed red blood cells yesterday with dialysis. Also started on steroids. Asked CIR to evaluate patient however they feel that patient should be discharged to skilled nursing facility. Patient family adamant about patient going to inpatient rehabilitation. On recent evaluation patient is a lot stronger PT and OT recommending inpatient rehabilitation. Reconsulted inpatient rehabilitation team.   Assessment & Plan: ESRD on dialysis [facility]) with hypotension - 2/2 myleoma - Nephrology following - continue HD TTS - Cr 2.23 today   Sepsis 2/2 klebsiella pneumoniae bacteremia - BC 8/3 positive - MRI/MRCP consistent with cholecystitis - Perc drain on 07/17/16 - minimal drainage today - Transitioned to Levaquin for 3 week course, start date - Afebrile, WBC normal today  Multiple myeloma ([place]) - Follows with Dr. Alvy Bimler - Given anemia/thrombocytopenia, treatment on hold - On steroids  Anemia and Thrombocytopenia ([place]) - Oncology consulted - Aranesp - HIT panel negative - Steroids - Received PRBC on 07/25/16 with HD - Platelets down to 97 today, but within range of 80s-120s  Abdominal pain/Transaminitis/cholecystitis - Improving, passed stone, drain in place - Drain to remain 6-8 weeks - Pain control with   N&V (nausea and vomiting) - Nausea control with zofran  Hyponatremia - mild and appears to be  ongoing given acute illness, monitor closely    Goals of Care Family has declined palliative care consult - No family at bedside today  Deconditioning - PT/OT - CIR - CIR thought patient better for SNF - Patient needs to dialyze in chair to likely discharge home - Further discussion tomorrow.   DVT prophylaxis: SCDs given platelets Code Status: full Family Communication: no family at bedside Disposition Plan: Barriers to discharge - family not wanting SNF, unable to sit for HD.  Further discussion with Nephrology tomorrow.    Consultants:   Nephrology  PT/OT  Gen Surgery  IR  PCCM  Procedures:   Perc Drain in place  Antimicrobials:  Levaquin for 3 week course   Subjective: Patient with increased anxiety this morning, requesting his home xanax.  He reports it helped.  He has no pain and no complaints on exam this morning.    Objective: Vitals:   07/28/16 1757 07/28/16 2027 07/29/16 0438 07/29/16 0736  BP: (!) 164/64 (!) 164/61 (!) 145/64 139/68  Pulse: 64 67 66 (!) 59  Resp: _0 Temp: 98.5 F (36.9 C) 98.4 F (36.9 C) 98 F (36.7 C) 97.8 F (36.6 C)  TempSrc: Oral Oral Oral Oral  SpO2: 97% 98% 98% 98%  Weight:  159 lb 11.2 oz (72.4 kg)    Height:        Intake/Output Summary (Last 24 hours) at 07/29/16 0918 Last data filed at 07/29/16 0736  Gross per 24 hour  Intake                0 ml  Output  575 ml  Net             -575 ml   Filed Weights   07/27/16 1018 07/27/16 2100 07/28/16 2027  Weight: 155 lb 3.3 oz (70.4 kg) 155 lb 6.8 oz (70.5 kg) 159 lb 11.2 oz (72.4 kg)    Examination:  General exam: Appears calm and comfortable HENT: MMM, NCAT  Respiratory system: Clear to auscultation. Respiratory effort normal. Chest: Catheter in place in right chest, clean and dry Cardiovascular system: S1 & S2 heard, RR, NR Gastrointestinal system: Abdomen is nondistended, soft and nontender.  Normal bowel sounds heard. Central nervous  system: Alert and oriented. No gross neurological deficits. Skin: No rashes, lesions or ulcers Psychiatry: Judgement and insight appear normal. He appears somewhat down    Data Reviewed:   CBC:  Recent Labs Lab 07/25/16 0705 07/26/16 0635 07/27/16 0650 07/28/16 0316 07/29/16 0831  WBC 7.4 8.2 7.7 6.9 8.8  NEUTROABS  --   --   --  5.0 6.1  HGB 7.2* 10.6* 10.6* 10.1* 10.4*  HCT 23.0* 33.3* 33.4* 32.5* 33.3*  MCV 95.4 91.2 91.3 92.9 92.5  PLT 121* 120* 121* 118* 97*   Basic Metabolic Panel:  Recent Labs Lab 07/25/16 0705 07/26/16 0635 07/27/16 0650 07/28/16 0316 07/29/16 0831  NA 135  136 133* 134* 135 132*  K 4.1  4.1 4.1 4.1 4.5 4.1  CL 101  99* 98* 98* 99* 100*  CO2 _0 GLUCOSE 107*  113* 232* 276* 375* 181*  BUN 31*  30* 17 25* 16 27*  CREATININE 3.13*  3.19* 2.07* 2.50* 1.86* 2.23*  CALCIUM 7.9*  8.1* 7.9* 8.0* 8.0* 8.0*  PHOS 4.1  --  4.2  --   --    GFR: Estimated Creatinine Clearance: 32.5 mL/min (by C-G formula based on SCr of 2.23 mg/dL). Liver Function Tests:  Recent Labs Lab 07/25/16 0705 07/27/16 0650  ALBUMIN 1.7* 1.8*   No results for input(s): LIPASE, AMYLASE in the last 168 hours. No results for input(s): AMMONIA in the last 168 hours. Coagulation Profile: No results for input(s): INR, PROTIME in the last 168 hours. Cardiac Enzymes: No results for input(s): CKTOTAL, CKMB, CKMBINDEX, TROPONINI in the last 168 hours. BNP (last 3 results) No results for input(s): PROBNP in the last 8760 hours. HbA1C: No results for input(s): HGBA1C in the last 72 hours. CBG:  Recent Labs Lab 07/28/16 1708 07/28/16 2029 07/29/16 0650  GLUCAP 387* 336* 190*   Lipid Profile: No results for input(s): CHOL, HDL, LDLCALC, TRIG, CHOLHDL, LDLDIRECT in the last 72 hours. Thyroid Function Tests: No results for input(s): TSH, T4TOTAL, FREET4, T3FREE, THYROIDAB in the last 72 hours. Anemia Panel: No results for input(s): VITAMINB12,  FOLATE, FERRITIN, TIBC, IRON, RETICCTPCT in the last 72 hours. Sepsis Labs: No results for input(s): PROCALCITON, LATICACIDVEN in the last 168 hours.  Recent Results (from the past 240 hour(s))  C difficile quick scan w PCR reflex     Status: None   Collection Time: 07/27/16  5:56 PM  Result Value Ref Range Status   C Diff antigen NEGATIVE NEGATIVE Final   C Diff toxin NEGATIVE NEGATIVE Final   C Diff interpretation No C. difficile detected.  Final         Radiology Studies: No results found.      Scheduled Meds: . sodium chloride   Intravenous Once  . darbepoetin (ARANESP) injection - DIALYSIS  200 mcg Intravenous Q Sat-HD  .  dexamethasone  8 mg Oral Q breakfast  . famotidine  20 mg Oral Daily  . feeding supplement  1 Container Oral TID BM  . fluticasone  1 spray Each Nare BID  . insulin aspart  0-20 Units Subcutaneous TID WC  . lactobacillus acidophilus & bulgar  1 tablet Oral TID WC  . levofloxacin  500 mg Oral Q48H  . midodrine  10 mg Oral TID WC  . multivitamin  1 tablet Oral QHS  . protein supplement  1 scoop Oral TID WC  . vitamin B-12  1,000 mcg Oral Daily  . Vitamin D (Ergocalciferol)  50,000 Units Oral Q7 days   Continuous Infusions:    LOS: 16 days    Time spent: 25 minutes    Gilles Chiquito, MD [facility] Pager [phone]  If 7PM-7AM, please contact night-coverage [email or web address] Password [email or web address] 07/29/2016, 9:18 AM

## 2016-07-29 NOTE — Progress Notes (Signed)
Thomas [name] Progress Note   Subjective:  "I had a bad spell last night..I got to where I couldn't breath and they had to give me one of those little white pills to help me relax. I feel a little better now.   Concerned about OP rehab-very anxious.   Objective Vitals:   07/28/16 1757 07/28/16 2027 07/29/16 0438 07/29/16 0736  BP: (!) 164/64 (!) 164/61 (!) 145/64 139/68  Pulse: 64 67 66 (!) 59  Resp: _0 Temp: 98.5 F (36.9 C) 98.4 F (36.9 C) 98 F (36.7 C) 97.8 F (36.6 C)  TempSrc: Oral Oral Oral Oral  SpO2: 97% 98% 98% 98%  Weight:  72.4 kg (159 lb 11.2 oz)    Height:       Physical Exam General: Awake, pleasant, NAD Heart: S1, S2, RRR Lungs: BBS CTA A/P Abdomen: soft nontender. RUG perc. Bilary drain with sm. Amt bilious drainage.  Extremities:Trace L ankle edema. SCDs in use.  Dialysis Access: RIJ perm cath drsg CDI-currently connected to HD lines. Maturing L BC AVF + bruit   Dialysis Orders: TTS SW 4h 73kg 3K/ 2.25 bath Hep 7300 R IJ cath/ maturing L BCF (05/24/16) Aranesp 40 g weekly pth 133 no vit d  Additional Objective Labs: Basic Metabolic Panel:  Recent Labs Lab 07/25/16 0705 07/26/16 0635 07/27/16 0650 07/28/16 0316  NA 135  136 133* 134* 135  K 4.1  4.1 4.1 4.1 4.5  CL 101  99* 98* 98* 99*  CO2 _1 GLUCOSE 107*  113* 232* 276* 375*  BUN 31*  30* 17 25* 16  CREATININE 3.13*  3.19* 2.07* 2.50* 1.86*  CALCIUM 7.9*  8.1* 7.9* 8.0* 8.0*  PHOS 4.1  --  4.2  --    Liver Function Tests:  Recent Labs Lab 07/25/16 0705 07/27/16 0650  ALBUMIN 1.7* 1.8*   No results for input(s): LIPASE, AMYLASE in the last 168 hours. CBC:  Recent Labs Lab 07/24/16 0444 07/25/16 0705 07/26/16 0635 07/27/16 0650 07/28/16 0316  WBC 9.4 7.4 8.2 7.7 6.9  NEUTROABS  --   --   --   --  5.0  HGB 7.4* 7.2* 10.6* 10.6* 10.1*  HCT 23.4* 23.0* 33.3* 33.4* 32.5*  MCV 94.7 95.4 91.2 91.3 92.9  PLT 115* 121*  120* 121* 118*   Blood Culture    Component Value Date/Time   SDES DRAINAGE BILE 07/17/2016 1718   SPECREQUEST Normal 07/17/2016 1718   CULT  07/17/2016 1718    MODERATE ENTEROBACTER CLOACAE MODERATE KLEBSIELLA PNEUMONIAE NO ANAEROBES ISOLATED    REPTSTATUS 07/22/2016 FINAL 07/17/2016 1718    Cardiac Enzymes: No results for input(s): CKTOTAL, CKMB, CKMBINDEX, TROPONINI in the last 168 hours. CBG:  Recent Labs Lab 07/28/16 1708 07/28/16 2029 07/29/16 0650  GLUCAP 387* 336* 190*   Iron Studies: No results for input(s): IRON, TIBC, TRANSFERRIN, FERRITIN in the last 72 hours. _2 @ Studies/Results: No results found. Medications:   . sodium chloride   Intravenous Once  . darbepoetin (ARANESP) injection - DIALYSIS  200 mcg Intravenous Q Sat-HD  . dexamethasone  8 mg Oral Q breakfast  . famotidine  20 mg Oral Daily  . feeding supplement  1 Container Oral TID BM  . fluticasone  1 spray Each Nare BID  . insulin aspart  0-20 Units Subcutaneous TID WC  . lactobacillus acidophilus & bulgar  1 tablet Oral TID WC  . levofloxacin  500 mg Oral Q48H  .  midodrine  10 mg Oral TID WC  . multivitamin  1 tablet Oral QHS  . protein supplement  1 scoop Oral TID WC  . vitamin B-12  1,000 mcg Oral Daily  . Vitamin D (Ergocalciferol)  50,000 Units Oral Q7 days     Assessment/Plan: 1.Abdominal pain/nausea/vomiting: cholecystitis-sp IR perc chole drainage 8/07 Blood Cx:1/2 klebsiellapneumoniae. On Levaquin po x 3wk. Doing better with nausea, appetite good.  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 Failed trial of holding dialysis. Cont TTS. Last HD 08/17. On HD today. K+ pending. Has been using 3.0 K bath.  3. Anemia: Secondary to ESRD/multiple myeloma-/ Infection - max ESA/ 2u PRBC 8/15 @ HD. HGB 10.4.today-stable.  4.Multiple myeloma:on chemotherapy/radiation to left femur-further management per Dr. 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 No binders/VDRA.   6.Volume/HTN - low bp's on midodrine,/ under dry wt, his periph edema is resolving. Still makes some urine. On HD UFG 2500. BP stable.  7. Debility -Apparently someone mentioned going home and patient stayed awake all night worried that wife would not be able to take care of him. Needs rehab ? Inpatient vs SNF. Is working with PT however names pain at [employer] drain limiting factor in movement.  8. Nutrition: Albumin 1.8 on reg diet. Phos OK> renal vit/protein supplement. 9. Vascular access- LUE BVT 1st stage (placed 05/24/16), +T/B, maturing.  He missed his outpatient appt with Dr. Bridgett Larsson.  If he remains in hospital we may have him check on fistula and schedule second stage of BVT.   Rita H. Brown NP-C 07/29/2016, 8:33 AM  [employer] [phone]     I have seen and examined this patient and agree with plan and assessment in the above note with renal recommendations/intervention highlighted. Due to SOB will challenge EDW slightly with HD but keep SBP >100. Broadus John A Arantxa Piercey,MD 07/30/2016 7:45 AM

## 2016-07-30 LAB — HEMOGLOBIN A1C
HEMOGLOBIN A1C: 5.8 % — AB (ref 4.8–5.6)
MEAN PLASMA GLUCOSE: 120 mg/dL

## 2016-07-30 LAB — BASIC METABOLIC PANEL
Anion gap: 10 (ref 5–15)
BUN: 24 mg/dL — AB (ref 6–20)
CHLORIDE: 99 mmol/L — AB (ref 101–111)
CO2: 27 mmol/L (ref 22–32)
Calcium: 8 mg/dL — ABNORMAL LOW (ref 8.9–10.3)
Creatinine, Ser: 2.02 mg/dL — ABNORMAL HIGH (ref 0.61–1.24)
GFR calc Af Amer: 37 mL/min — ABNORMAL LOW (ref >60)
GFR, EST NON AFRICAN AMERICAN: 32 mL/min — AB (ref >60)
Glucose, Bld: 277 mg/dL — ABNORMAL HIGH (ref 65–99)
Potassium: 4.8 mmol/L (ref 3.5–5.1)
SODIUM: 136 mmol/L (ref 135–145)

## 2016-07-30 LAB — GLUCOSE, CAPILLARY
GLUCOSE-CAPILLARY: 259 mg/dL — AB (ref 65–99)
GLUCOSE-CAPILLARY: 273 mg/dL — AB (ref 65–99)
Glucose-Capillary: 256 mg/dL — ABNORMAL HIGH (ref 65–99)
Glucose-Capillary: 214 mg/dL — ABNORMAL HIGH (ref 65–99)

## 2016-07-30 MED ORDER — METHOCARBAMOL 500 MG PO TABS: 500.0000 mg | ORAL_TABLET | Freq: Three times a day (TID) | ORAL | Status: DC | PRN

## 2016-07-30 NOTE — Clinical Social Work Note (Signed)
Clinical Social Worker continuing to follow patient and family for support and discharge planning needs.  CSW met with patient and family at bedside to provide SNF bed offers, however per MD note, patient is no longer appropriate for outpatient dialysis.  MD recommended LTAC consult - CSW updated RNCM to pursue.  Patient insurance could be a barrier to LTAC placement.  CSW will remain available for placement needs once patient is more appropriate.  Patient has requested placement at [facility] and has a bed available once appropriate for outpatient HD and SNF placement.  CSW available for support and to assist with discharge planning needs.  Barbette Or, [place]

## 2016-07-30 NOTE — Progress Notes (Signed)
[facility] Progress Note   Subjective:  "I'm doing OK. Talking with minister. No C/Os.   Objective Vitals:   07/29/16 1712 07/29/16 2006 07/30/16 0600 07/30/16 0845  BP: (!) 130/56 (!) 121/58 131/67 114/62  Pulse: 70 77 73 88  Resp: 18 18 18 18   Temp: 97.7 F (36.5 C) 97.8 F (36.6 C) 99.2 F (37.3 C) 98.1 F (36.7 C)  TempSrc: Oral  Oral Oral  SpO2: 96% 98% 98% 97%  Weight:      Height:       Physical Exam General: Awake, pleasant, NAD Heart: S1, S2, RRR Lungs: BBS CTA A/P Abdomen: soft nontender. RUG perc. Bilary drain with sm. Amt bilious drainage.  Extremities:Trace L ankle edema. SCDs in use.  Dialysis Access: RIJ perm cath drsg CDI-currently connected to HD lines. Maturing L BC AVF + bruit   Dialysis Orders:TTS SW 4h 73kg 3K/ 2.25 bath Hep 7300 R IJ cath/ maturing L BCF (05/24/16) Aranesp 40 g weekly pth 133 no vit d   Additional Objective Labs: Basic Metabolic Panel:  Recent Labs Lab 07/25/16 0705  07/27/16 0650 07/28/16 0316 07/29/16 0831 07/30/16 0534  NA 135  136  < > 134* 135 132* 136  K 4.1  4.1  < > 4.1 4.5 4.1 4.8  CL 101  99*  < > 98* 99* 100* 99*  CO2 29  29  < > 26 27 26 27   GLUCOSE 107*  113*  < > 276* 375* 181* 277*  BUN 31*  30*  < > 25* 16 27* 24*  CREATININE 3.13*  3.19*  < > 2.50* 1.86* 2.23* 2.02*  CALCIUM 7.9*  8.1*  < > 8.0* 8.0* 8.0* 8.0*  PHOS 4.1  --  4.2  --   --   --   < > = values in this interval not displayed. Liver Function Tests:  Recent Labs Lab 07/25/16 0705 07/27/16 0650  ALBUMIN 1.7* 1.8*   No results for input(s): LIPASE, AMYLASE in the last 168 hours. CBC:  Recent Labs Lab 07/25/16 0705 07/26/16 0635 07/27/16 0650 07/28/16 0316 07/29/16 0831  WBC 7.4 8.2 7.7 6.9 8.8  NEUTROABS  --   --   --  5.0 6.1  HGB 7.2* 10.6* 10.6* 10.1* 10.4*  HCT 23.0* 33.3* 33.4* 32.5* 33.3*  MCV 95.4 91.2 91.3 92.9 92.5  PLT 121* 120* 121* 118* 97*   Blood Culture    Component  Value Date/Time   SDES DRAINAGE BILE 07/17/2016 1718   SPECREQUEST Normal 07/17/2016 1718   CULT  07/17/2016 1718    MODERATE ENTEROBACTER CLOACAE MODERATE KLEBSIELLA PNEUMONIAE NO ANAEROBES ISOLATED    REPTSTATUS 07/22/2016 FINAL 07/17/2016 1718    CBG:  Recent Labs Lab 07/29/16 0650 07/29/16 1329 07/29/16 1716 07/29/16 2005 07/30/16 0742  GLUCAP 190* 111* 189* 261* 259*   Studies/Results: No results found. Medications:   . sodium chloride   Intravenous Once  . darbepoetin (ARANESP) injection - DIALYSIS  200 mcg Intravenous Q Sat-HD  . dexamethasone  8 mg Oral Q breakfast  . famotidine  20 mg Oral Daily  . feeding supplement  1 Container Oral TID BM  . fluticasone  1 spray Each Nare BID  . insulin aspart  0-20 Units Subcutaneous TID WC  . lactobacillus acidophilus & bulgar  1 tablet Oral TID WC  . levofloxacin  500 mg Oral Q48H  . midodrine  10 mg Oral TID WC  . multivitamin  1 tablet Oral QHS  .  protein supplement  1 scoop Oral TID WC  . vitamin B-12  1,000 mcg Oral Daily  . Vitamin D (Ergocalciferol)  50,000 Units Oral Q7 days     Assessment/Plan: 1.Abdominal pain/nausea/vomiting: cholecystitis-sp IR perc chole drainage 8/07 Blood Cx:1/2 klebsiellapneumoniae. On Levaquin po x 3wk. Doing better with nausea, appetite good.  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 Failed trial of holding dialysis. Cont TTS. Last HD 08/17. On HD today. K+ pending. Has been using 3.0 K bath.  3. Anemia: Secondary to ESRD/multiple myeloma-/ Infection - max ESA/ 2u PRBC 8/15 @ HD. HGB 10.4.08/19-stable.  4.Multiple myeloma:on chemotherapy/radiation to left femur-further management per Dr. 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 No binders/VDRA.   6.Volume/HTN - low bp's on midodrine,/ under dry wt, his periph edema is resolving. Still makes some urine. Had HD 07/30/18 Pre wt 71.8 kg Net UF 500 Post wt 71.3 kg. Next tx  08/01/16.  7. Debility -Is being evaluated for inpatient rehab per primary note.  8. Nutrition: Albumin 1.8 on reg diet. renal vit/protein supplement. 9. Vascular access- LUE BVT 1st stage (placed 05/24/16), +T/B, maturing. He missed his outpatient appt with Dr. Bridgett Larsson. If he remains in hospital we may have him check on fistula and schedule second stage of BVT.  10. Pressure ulcer- due to deconditioning-  Pt is too weak to stand and transfer from bed to wheelchair and no longer suitable for outpatient hemodialysis and will require either inpatient HD or LTC facility such as [facility].  Would recommend consult to [facility].  He is very motivated to walk and should do well with PT and OT.  He also has some anxiety about everything and tolerated small dose of xanax before HD will continue for now. HE IS NOT READY FOR DISCHARGE AS HE CANNOT TRANSFER AND THEREFORE NOT SUITABLE FOR OUTPATIENT HD AT THIS TIME AND WILL REQUIRE FURTHER REHAB.   Rita H. Brown NP-C 07/30/2016, 10:29 AM  [employer] [phone]     I have seen and examined this patient and agree with plan and assessment in the above note with renal recommendations/intervention highlighted.  Broadus John A Lavora Brisbon,MD 07/30/2016 11:38 AM

## 2016-07-30 NOTE — Progress Notes (Signed)
RN flushed patient's biliary drain with 5cc NS, no resistance met.  Ermalinda Memos, RN

## 2016-07-30 NOTE — Progress Notes (Addendum)
PROGRESS NOTE    Thomas [name]  AXE:[id number] DOB: [birth year] DOA: 07/13/2016 PCP: No PCP Per Patient    Brief Narrative:  70 y.o.M with recent diagnosis of MM (end of April 2017), currently undergoing chemo and radiation. Started on new chemo drug last week (7/31) and developed significant diarrhea afterwards. Had symptoms for 3 - 4 days then on 8/3 began to have N/V and abd pain. Found to have possible acalculous cholecystitis. PCCM asked to admit to ICU due to hypotension.  Barrier to discharge her physical activity and sitting up with hemodialysis. Patient was given 2 units packed red blood cells 8/15 with dialysis. Also started on steroids. Asked CIR to evaluate patient however they feel that patient should be discharged to skilled nursing facility. Patient family adamant about patient going to inpatient rehabilitation. On recent evaluation patient is a lot stronger PT and OT recommending inpatient rehabilitation. Reconsulted inpatient rehabilitation team.   Assessment & Plan: ESRD on dialysis [facility]) with hypotension - 2/2 myleoma - Nephrology following - continue HD TTS - Cr 2.23 today  - Hypotension with standing, on midodrine.   Sepsis 2/2 klebsiella pneumoniae bacteremia - BC 8/3 positive - MRI/MRCP consistent with cholecystitis - Perc drain on 07/17/16 - minimal drainage noted today.  Should be in place for 6-8 weeks.  - Transitioned to Levaquin for 3 week course, start date 8/8 - Afebrile, WBC normal today  Multiple myeloma (Thomas [name]) - Follows with Dr. Alvy Bimler - Given anemia/thrombocytopenia, treatment on hold - On steroids  Anemia and Thrombocytopenia (Thomas [name]) - Oncology consulted - Aranesp - HIT panel negative - Steroids - Received PRBC on 07/25/16 with HD - Platelets down to 97 today, but within range of 80s-120s  Abdominal pain/Transaminitis/cholecystitis - Improving, passed stone, drain in place - Drain to remain 6-8 weeks - Pain control with oxycodone  N&V  (nausea and vomiting) - Nausea control with zofran  Hyponatremia - mild and appears to be ongoing given acute illness, monitor closely - 136 today    Goals of Care - Family had declined PC consult - Daughter and wife at bedside today.   Deconditioning - PT/OT - CIR - CIR thought patient better for SNF - Discussed with patient and reviewed nephrology notes - Patient cannot transfer and is therefore not a candidate for outpatient SNF/HD.  He needs inpatient rehab vs. LTAC.  Consult for re-eval by inpatient rehab was placed late Friday afternoon, unclear if phone call to team was made, but no evaluation has been made over the weekend.  - On Monday, CIR should be reconsulted.  Also would contact [facility] to request evaluation for transfer.  His medical issues are relatively stable at this time with good plan for Abx and drain duration.    DVT prophylaxis: SCDs given platelets Code Status: full Family Communication: daughter and wife at bedside Disposition Plan: Barriers to discharge above.    Consultants:   Nephrology  PT/OT  Gen Surgery  IR  PCCM  Procedures:   Perc Drain in place, 07/17/16  Antimicrobials:  Levaquin for 3 week course - start date 07/18/16   Subjective: Patient much improved this morning, reports feeling week.    Objective: Vitals:   07/29/16 1712 07/29/16 2006 07/30/16 0600 07/30/16 0845  BP: (!) 130/56 (!) 121/58 131/67 114/62  Pulse: 70 77 73 88  Resp: 18 18 18 18   Temp: 97.7 F (36.5 C) 97.8 F (36.6 C) 99.2 F (37.3 C) 98.1 F (36.7 C)  TempSrc: Oral  Oral Oral  SpO2:  96% 98% 98% 97%  Weight:      Height:        Intake/Output Summary (Last 24 hours) at 07/30/16 1624 Last data filed at 07/30/16 1100  Gross per 24 hour  Intake              180 ml  Output              725 ml  Net             -545 ml   Filed Weights   07/28/16 2027 07/29/16 0828 07/29/16 1230  Weight: 159 lb 11.2 oz (72.4 kg) 158 lb 4.6 oz (71.8 kg) 157 lb 3 oz (71.3  kg)    Examination:  General exam: Appears calm and comfortable, thin HENT: MMM, NCAT  Respiratory system: Clear to auscultation. Respiratory effort normal. Chest: Catheter in place in right chest, clean and dry Cardiovascular system: S1 & S2 heard, RR, NR Gastrointestinal system: Abdomen is scaphhoid, soft and nontender.  +BS Central nervous system: Alert and oriented. Skin: No rashes, lesions or ulcers Psychiatry: Judgement and insight appear normal. He appears somewhat down    Data Reviewed:   CBC:  Recent Labs Lab 07/25/16 0705 07/26/16 0635 07/27/16 0650 07/28/16 0316 07/29/16 0831  WBC 7.4 8.2 7.7 6.9 8.8  NEUTROABS  --   --   --  5.0 6.1  HGB 7.2* 10.6* 10.6* 10.1* 10.4*  HCT 23.0* 33.3* 33.4* 32.5* 33.3*  MCV 95.4 91.2 91.3 92.9 92.5  PLT 121* 120* 121* 118* 97*   Basic Metabolic Panel:  Recent Labs Lab 07/25/16 0705 07/26/16 0635 07/27/16 0650 07/28/16 0316 07/29/16 0831 07/30/16 0534  NA 135  136 133* 134* 135 132* 136  K 4.1  4.1 4.1 4.1 4.5 4.1 4.8  CL 101  99* 98* 98* 99* 100* 99*  CO2 29  29 27 26 27 26 27   GLUCOSE 107*  113* 232* 276* 375* 181* 277*  BUN 31*  30* 17 25* 16 27* 24*  CREATININE 3.13*  3.19* 2.07* 2.50* 1.86* 2.23* 2.02*  CALCIUM 7.9*  8.1* 7.9* 8.0* 8.0* 8.0* 8.0*  PHOS 4.1  --  4.2  --   --   --    GFR: Estimated Creatinine Clearance: 35.3 mL/min (by C-G formula based on SCr of 2.02 mg/dL). Liver Function Tests:  Recent Labs Lab 07/25/16 0705 07/27/16 0650  ALBUMIN 1.7* 1.8*   No results for input(s): LIPASE, AMYLASE in the last 168 hours. No results for input(s): AMMONIA in the last 168 hours. Coagulation Profile: No results for input(s): INR, PROTIME in the last 168 hours. Cardiac Enzymes: No results for input(s): CKTOTAL, CKMB, CKMBINDEX, TROPONINI in the last 168 hours. BNP (last 3 results) No results for input(s): PROBNP in the last 8760 hours. HbA1C:  Recent Labs  07/29/16 0830  HGBA1C 5.8*    CBG:  Recent Labs Lab 07/29/16 1329 07/29/16 1716 07/29/16 2005 07/30/16 0742 07/30/16 1125  GLUCAP 111* 189* 261* 259* 256*   Lipid Profile: No results for input(s): CHOL, HDL, LDLCALC, TRIG, CHOLHDL, LDLDIRECT in the last 72 hours. Thyroid Function Tests: No results for input(s): TSH, T4TOTAL, FREET4, T3FREE, THYROIDAB in the last 72 hours. Anemia Panel: No results for input(s): VITAMINB12, FOLATE, FERRITIN, TIBC, IRON, RETICCTPCT in the last 72 hours. Sepsis Labs: No results for input(s): PROCALCITON, LATICACIDVEN in the last 168 hours.  Recent Results (from the past 240 hour(s))  C difficile quick scan w PCR reflex  Status: None   Collection Time: 07/27/16  5:56 PM  Result Value Ref Range Status   C Diff antigen NEGATIVE NEGATIVE Final   C Diff toxin NEGATIVE NEGATIVE Final   C Diff interpretation No C. difficile detected.  Final         Radiology Studies: No results found.      Scheduled Meds: . sodium chloride   Intravenous Once  . darbepoetin (ARANESP) injection - DIALYSIS  200 mcg Intravenous Q Sat-HD  . dexamethasone  8 mg Oral Q breakfast  . famotidine  20 mg Oral Daily  . feeding supplement  1 Container Oral TID BM  . fluticasone  1 spray Each Nare BID  . insulin aspart  0-20 Units Subcutaneous TID WC  . lactobacillus acidophilus & bulgar  1 tablet Oral TID WC  . levofloxacin  500 mg Oral Q48H  . midodrine  10 mg Oral TID WC  . multivitamin  1 tablet Oral QHS  . protein supplement  1 scoop Oral TID WC  . vitamin B-12  1,000 mcg Oral Daily  . Vitamin D (Ergocalciferol)  50,000 Units Oral Q7 days   Continuous Infusions:    LOS: 17 days    Time spent: 25 minutes    Gilles Chiquito, MD [facility] Pager [phone]  If 7PM-7AM, please contact night-coverage [email or web address] Password [facility] 07/30/2016, 4:24 PM

## 2016-07-31 ENCOUNTER — Other Ambulatory Visit: Payer: PPO

## 2016-07-31 ENCOUNTER — Ambulatory Visit: Payer: PPO

## 2016-07-31 LAB — CBC
HEMATOCRIT: 36.5 % — AB (ref 39.0–52.0)
HEMOGLOBIN: 11.3 g/dL — AB (ref 13.0–17.0)
MCH: 29 pg (ref 26.0–34.0)
MCHC: 31 g/dL (ref 30.0–36.0)
MCV: 93.8 fL (ref 78.0–100.0)
Platelets: 86 10*3/uL — ABNORMAL LOW (ref 150–400)
RBC: 3.89 MIL/uL — AB (ref 4.22–5.81)
RDW: 15.2 % (ref 11.5–15.5)
WBC: 7.6 10*3/uL (ref 4.0–10.5)

## 2016-07-31 LAB — BASIC METABOLIC PANEL
ANION GAP: 6 (ref 5–15)
BUN: 38 mg/dL — AB (ref 6–20)
CHLORIDE: 104 mmol/L (ref 101–111)
CO2: 25 mmol/L (ref 22–32)
Calcium: 8.1 mg/dL — ABNORMAL LOW (ref 8.9–10.3)
Creatinine, Ser: 2.34 mg/dL — ABNORMAL HIGH (ref 0.61–1.24)
GFR, EST AFRICAN AMERICAN: 31 mL/min — AB (ref >60)
GFR, EST NON AFRICAN AMERICAN: 27 mL/min — AB (ref >60)
Glucose, Bld: 213 mg/dL — ABNORMAL HIGH (ref 65–99)
POTASSIUM: 5 mmol/L (ref 3.5–5.1)
SODIUM: 135 mmol/L (ref 135–145)

## 2016-07-31 LAB — GLUCOSE, CAPILLARY
GLUCOSE-CAPILLARY: 229 mg/dL — AB (ref 65–99)
GLUCOSE-CAPILLARY: 207 mg/dL — AB (ref 65–99)
Glucose-Capillary: 180 mg/dL — ABNORMAL HIGH (ref 65–99)
Glucose-Capillary: 339 mg/dL — ABNORMAL HIGH (ref 65–99)

## 2016-07-31 MED ORDER — ENSURE ENLIVE PO LIQD
237.0000 mL | Freq: Two times a day (BID) | ORAL | Status: DC
  Administered 2016-08-01: 237 mL via ORAL

## 2016-07-31 MED ORDER — PRO-STAT SUGAR FREE PO LIQD
30.0000 mL | Freq: Two times a day (BID) | ORAL | Status: DC
  Filled 2016-07-31 (×3): qty 30

## 2016-07-31 MED ORDER — MIDODRINE HCL 5 MG PO TABS
10.0000 mg | ORAL_TABLET | Freq: Three times a day (TID) | ORAL | Status: DC
  Administered 2016-07-31 – 2016-08-02 (×8): 10 mg via ORAL
  Filled 2016-07-31 (×7): qty 2

## 2016-07-31 NOTE — Plan of Care (Signed)
Problem: Activity: Goal: Risk for activity intolerance will decrease Outcome: Progressing Pt OOB and ambulated in room with walker and 2 person assist. Pt currently OOB to recliner chair.    Carole Civil, RN, [facility] [address] [place] Phone [phone]

## 2016-07-31 NOTE — Progress Notes (Signed)
Nutrition Follow-up  DOCUMENTATION CODES:   Non-severe (moderate) malnutrition in context of chronic illness  INTERVENTION:  Discontinue Boost Breeze.  Provide Ensure Enlive po BID, each supplement provides 350 kcal and 20 grams of protein.  Provide 30 ml Prostat po BID, each supplement provides 100 kcal and 15 grams of protein.   Encourage adequate PO intake.   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 He presented on  8/3 with one-day history of nausea, vomiting and abdominal pain and some preceding diarrhea prior to that. Developed septic shock after days of diarrhea Reports decreased ability to tolerate oral intake. In the emergency room he was noted to be hypotensive and ultrasound suggestive of acalculous cholecystitis-seen by surgery and awaiting HIDA scan. Perc drain on 07/17/16.  Meal completion has been varied from 10-100% with 25% po at lunch today. Pt currently has Boost Breeze ordered however has been refusing them. RD to order Prostat and Ensure to aid in caloric and protein needs. Encouraged adequate PO intake.   Labs and medications reviewed.   Diet Order:  Diet regular Room service appropriate? Yes; Fluid consistency: Thin; Fluid restriction: 2000 mL Fluid  Skin:  Wound (see comment) (Stage II on  coccyx)  Last BM:  8/18  Height:   Ht Readings from Last 1 Encounters:  07/20/16 6' 1"  (1.854 m)    Weight:   Wt Readings from Last 1 Encounters:  07/30/16 154 lb 5.2 oz (70 kg)    Ideal Body Weight:  83.6 kg  BMI:  Body mass index is 20.36 kg/m.  Estimated Nutritional Needs:   Kcal:  2100-2300  Protein:  100-115 grams  Fluid:  Per MD  EDUCATION NEEDS:   No education needs identified at this time  Corrin Parker, MS, RD, LDN Pager # [phone] After hours/ weekend pager # [phone]

## 2016-07-31 NOTE — Progress Notes (Signed)
HD dressing assessed, pt has a transparent dressing with a biopatch in place, dressing change is not needed at this time. Dressing is CDI. [employer] '

## 2016-07-31 NOTE — Progress Notes (Signed)
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 He presented on  8/3 with one-day history of nausea, vomiting and abdominal pain Pt did have percutaneous cholecystomy drain placed 8/7.     OT comments  Pt making excellent progress. Ambulated @ 25 ft with +2 min A. Increased participation with ADL. Motivated to get stronger and return home with wife. Feel pt is an excellent CIR candidate. Will followacutely to address established goals.   Follow Up Recommendations  CIR    Equipment Recommendations  Other (comment)    Recommendations for Other Services Rehab consult    Precautions / Restrictions Precautions Precautions: Fall Precaution Comments: Watch orthostatics; Biliary tube R side Restrictions Weight Bearing Restrictions: No       Mobility Bed Mobility               General bed mobility comments: OOB in chair  Transfers Overall transfer level: Needs assistance Equipment used: Rolling walker (2 wheeled) Transfers: Sit to/from Stand Sit to Stand: +2 safety/equipment;Min assist Stand pivot transfers: Min assist;+2 safety/equipment       General transfer comment: A to power up    Balance Overall balance assessment: Needs assistance Sitting-balance support: Single extremity supported Sitting balance-Leahy Scale: Fair     Standing balance support: Bilateral upper extremity supported Standing balance-Leahy Scale: Poor Standing balance comment: unable to maintain standing without UE support                   ADL Overall ADL's :  Needs assistance/impaired     Grooming: Set up;Sitting   Upper Body Bathing: Sitting;Set up   Lower Body Bathing: Moderate assistance;Sit to/from stand   Upper Body Dressing : Set up;Sitting   Lower Body Dressing: Moderate assistance;Sit to/from stand   Toilet Transfer: Minimal assistance;RW;+2 for physical assistance   Toileting- Clothing Manipulation and Hygiene: Moderate assistance       Functional mobility during ADLs: Minimal assistance;+2 for safety/equipment;Rolling walker General ADL Comments: Pt and wife able to get his pants on today      Vision                     Perception     Praxis      Cognition   Behavior During Therapy: [facility] for tasks assessed/performed Overall Cognitive Status: Within Functional Limits for tasks assessed                       Extremity/Trunk Assessment               Exercises General Exercises - Lower Extremity Ankle Circles/Pumps: AROM;Both;10 reps;Seated Long Arc Quad: AROM;Both;10 reps;Seated Hip Flexion/Marching: AROM;Both;10 reps;Seated   Shoulder Instructions       General Comments      Pertinent Vitals/ Pain       Pain Assessment: Faces Faces Pain Scale: Hurts little more Pain Location: R side Pain Descriptors / Indicators: Discomfort;Guarding Pain Intervention(s): Limited activity within patient's tolerance  Home Living  Prior Functioning/Environment              Frequency Min 2X/week     Progress Toward Goals  OT Goals(current goals can now be found in the care plan section)  Progress towards OT goals: Progressing toward goals  Acute Rehab OT Goals Patient Stated Goal: get stronger and get back home OT Goal Formulation: With patient Time For Goal Achievement: 08/04/16 Potential to Achieve Goals: Good ADL Goals Pt Will Perform Grooming: with set-up;sitting Pt Will Transfer to Toilet: with min assist;bedside  commode Pt Will Perform Toileting - Clothing Manipulation and hygiene: with min assist;sit to/from stand  Plan Discharge plan remains appropriate    Co-evaluation    PT/OT/SLP Co-Evaluation/Treatment: Yes Reason for Co-Treatment: Complexity of the patient's impairments (multi-system involvement);For patient/therapist safety PT goals addressed during session: Mobility/safety with mobility;Balance;Strengthening/ROM;Proper use of DME OT goals addressed during session: ADL's and self-care      End of Session Equipment Utilized During Treatment: Gait belt;Rolling walker   Activity Tolerance Patient tolerated treatment well   Patient Left in chair;with call bell/phone within reach;with family/visitor present   Nurse Communication Mobility status        Time: 5003-7048 OT Time Calculation (min): 20 min  Charges: OT General Charges $OT Visit: 1 Procedure OT Treatments $Self Care/Home Management : 8-22 mins  Abagayle Klutts,HILLARY 07/31/2016, 1:47 PM   [facility], OTR/L  [phone] 07/31/2016

## 2016-07-31 NOTE — Progress Notes (Signed)
[facility] Progress Note  Assessment/Plan: 1.Abdominal pain/nausea/vomiting: cholecystitis-sp IR perc chole drainage 8/07 Blood Cx:1/2 klebsiellapneumoniae. On Levaquin po x 3wk.  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 Failed trial of holding dialysis. K 5 - HD Tuesday use hoyer lift for transfer into recliner/wts to ascertain readiness for d/c- I explained why we need to do this - ok for xanax pre HD prn 3. Anemia: Secondary to ESRD/multiple myeloma-/ Infection - max ESA/ 2u PRBC 8/15 hgb 11.3 4.Multiple myeloma:on chemotherapy/radiation/decadron to left femur-further management per Dr. 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  6.Volume/HTN -  on midodrine BP had been low - up this am under dry wt, his periph edema is resolving.Still makes some urine.  HD 07/30/18  Net UF 500 Post wt 71.3 kg. Next tx 08/01/16. - hold midodrine for BP >130- lower edw for d/c 7. Debility -Is being evaluated for inpatient rehab/LTAC/SNF per primary note.  8. Nutrition: Albumin 1.8 on reg diet. renal vit/protein supplement. 9. Vascular access- LUE BVT 1st stage (placed 05/24/16), +T/B, maturing. He missed his outpatient appt with Dr. Bridgett Larsson. If he remains in hospital we may have him check on fistula and schedule second stage of BVT.  10. Pressure ulcer- due to deconditioning/diarrhea which has since resolved 11.  Debility/ disposition - is stronger today and walked in his room, w minimal help sat on side of bed today.  Is in the chair now and going to try to stay there for a full 4 hours.  HD tomorrow in the chair.   12. Thrombocytopenia - plts trending down - hold heparin if < 80 on Tuesday   Myriam Jacobson, [facility] Beeper [phone] 07/31/2016,10:04 AM  LOS: 18 days   Pt seen, examined and agree w A/P as above.  [name] MD [facility] pager [phone]    cell [phone] 07/31/2016, 1:38 PM     Subjective:    Complains that the belt PT uses rubs against his drainage tube and is very painful. Frustrated about lack of progress and wants more PT. Ate 100% breakfast. He prefers bed HD to recliner.  Objective Vitals:   07/30/16 0845 07/30/16 1653 07/30/16 2154 07/31/16 0541  BP: 114/62 (!) 157/65 (!) 173/80 (!) 150/70  Pulse: 88 72 62 74  Resp: 18 18 19 18   Temp: 98.1 F (36.7 C) 99 F (37.2 C) 97.7 F (36.5 C) 97.7 F (36.5 C)  TempSrc: Oral Oral Oral Oral  SpO2: 97% 98% 98% 97%  Weight:   70 kg (154 lb 5.2 oz)   Height:       Physical Exam General: NAD , thin Heart: RRR Lungs: no rales Abdomen: soft NT except at site of biliary tract tube Extremities: no edema Dialysis Access: right IJ and left BC AVF maturing + bruit  Dialysis Orders: TTS SW 4h 73kg 3K/ 2.25 bath Hep 7300 R IJ cath/ maturing L BCF (05/24/16) Aranesp 40 g weekly pth 133 no vit d   Additional Objective Labs: Basic Metabolic Panel:  Recent Labs Lab 07/25/16 0705  07/27/16 0650  07/29/16 0831 07/30/16 0534 07/31/16 0734  NA 135  136  < > 134*  < > 132* 136 135  K 4.1  4.1  < > 4.1  < > 4.1 4.8 5.0  CL 101  99*  < > 98*  < > 100* 99* 104  CO2 29  29  < > 26  < > 26 27  25  GLUCOSE 107*  113*  < > 276*  < > 181* 277* 213*  BUN 31*  30*  < > 25*  < > 27* 24* 38*  CREATININE 3.13*  3.19*  < > 2.50*  < > 2.23* 2.02* 2.34*  CALCIUM 7.9*  8.1*  < > 8.0*  < > 8.0* 8.0* 8.1*  PHOS 4.1  --  4.2  --   --   --   --   < > = values in this interval not displayed. Liver Function Tests:  Recent Labs Lab 07/25/16 0705 07/27/16 0650  ALBUMIN 1.7* 1.8*   CBC:  Recent Labs Lab 07/26/16 0635 07/27/16 0650 07/28/16 0316 07/29/16 0831 07/31/16 0734  WBC 8.2 7.7 6.9 8.8 7.6  NEUTROABS  --   --  5.0 6.1  --   HGB 10.6* 10.6* 10.1* 10.4* 11.3*  HCT 33.3* 33.4* 32.5* 33.3* 36.5*  MCV 91.2 91.3 92.9 92.5 93.8  PLT 120* 121* 118* 97* 86*    Recent Labs Lab 07/30/16 0742 07/30/16 1125  07/30/16 1658 07/30/16 2153 07/31/16 0746  GLUCAP 259* 256* 214* 273* 180*   Studies/Results: No results found. Medications:   . sodium chloride   Intravenous Once  . darbepoetin (ARANESP) injection - DIALYSIS  200 mcg Intravenous Q Sat-HD  . dexamethasone  8 mg Oral Q breakfast  . famotidine  20 mg Oral Daily  . feeding supplement  1 Container Oral TID BM  . fluticasone  1 spray Each Nare BID  . insulin aspart  0-20 Units Subcutaneous TID WC  . lactobacillus acidophilus & bulgar  1 tablet Oral TID WC  . levofloxacin  500 mg Oral Q48H  . midodrine  10 mg Oral TID WC  . multivitamin  1 tablet Oral QHS  . protein supplement  1 scoop Oral TID WC  . vitamin B-12  1,000 mcg Oral Daily  . Vitamin D (Ergocalciferol)  50,000 Units Oral Q7 days

## 2016-07-31 NOTE — Progress Notes (Signed)
Inpatient Diabetes Program Recommendations  AACE/ADA: New Consensus Statement on Inpatient Glycemic Control (2015)  Target Ranges:  Prepandial:   less than 140 mg/dL      Peak postprandial:   less than 180 mg/dL (1-2 hours)      Critically ill patients:  140 - 180 mg/dL   Lab Results  Component Value Date   GLUCAP 229 (H) 07/31/2016   HGBA1C 5.8 (H) 07/29/2016    Review of Glycemic Control  Inpatient Diabetes Program Recommendations: Noted no history of diabetes with hyperglycemia due to steroid Decadron. Noted order for correction insulin scales, however patient is getting a Regular diet which is contributing to the hyperglycemia. Please just while on Decadron consider ordering a carbohydrate modified diet which is an extremely healthy diet without excessive sugar and fat  Thank you Rosita Kea, RN, MSN, CDE  Diabetes Inpatient Program Office: [phone] Pager: [phone] 8:00 am to 5:00 pm

## 2016-07-31 NOTE — Evaluation (Signed)
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 He presented on  8/3 with one-day history of nausea, vomiting and abdominal pain Pt did have percutaneous cholecystomy drain placed 8/7.    Clinical Impression  Pt admitted with above diagnosis. Pt currently with functional limitations due to the deficits listed below (see PT Problem List). Pt much improved today, had transferred bed to chair with RN. Ambulated 25' with RW and min A +2 for safety. Chair brought behind. Pt very motivated to progress mobility.   Pt will benefit from skilled PT to increase their independence and safety with mobility to allow discharge to the venue listed below.       Follow Up Recommendations CIR    Equipment Recommendations  None recommended by PT    Recommendations for Other Services Rehab consult     Precautions / Restrictions Precautions Precautions: Fall Precaution Comments: Watch orthostatics; Biliary tube R side Restrictions Weight Bearing Restrictions: No      Mobility  Bed Mobility               General bed mobility comments: pt received in chair  Transfers Overall transfer level: Needs assistance Equipment used: Rolling walker (2 wheeled) Transfers: Sit to/from Stand Sit to Stand: +2 safety/equipment;Min assist         General transfer comment: min A for support and fwd wt shift, +2 for safety  Ambulation/Gait Ambulation/Gait assistance: Min assist;+2 safety/equipment Ambulation Distance (Feet): 25 Feet Assistive device: Rolling walker (2  wheeled) Gait Pattern/deviations: Step-through pattern;Shuffle;Decreased weight shift to left Gait velocity: decreased Gait velocity interpretation: <1.8 ft/sec, indicative of risk for recurrent falls General Gait Details: pt requested to sit after 25' due to left knee feeling that it would give way (still recovering from rod placement in left femur 6/17).   Stairs            Wheelchair Mobility    Modified Rankin (Stroke Patients Only)       Balance Overall balance assessment: Needs assistance Sitting-balance support: Single extremity supported Sitting balance-Leahy Scale: Fair     Standing balance support: Bilateral upper extremity supported Standing balance-Leahy Scale: Poor Standing balance comment: unable to maintain standing without UE support                             Pertinent Vitals/Pain Pain Assessment: Faces Faces Pain Scale: Hurts little more Pain Location: right side after ambulation Pain Descriptors / Indicators: Aching;Guarding Pain Intervention(s): Limited activity within patient's tolerance;Monitored during session    Home Living                        Prior Function                 Hand Dominance        Extremity/Trunk Assessment                         Communication      Cognition Arousal/Alertness: Awake/alert Behavior During Therapy: WFL for tasks assessed/performed  Overall Cognitive Status: Within Functional Limits for tasks assessed                      General Comments General comments (skin integrity, edema, etc.): O2 sats 95%, HR 125 with ambulation, down to 115 bpm within 1 min    Exercises General Exercises - Lower Extremity Ankle Circles/Pumps: AROM;Both;10 reps;Seated Long Arc Quad: AROM;Both;10 reps;Seated Hip Flexion/Marching: AROM;Both;10 reps;Seated      Assessment/Plan    PT Assessment    PT Diagnosis     PT Problem List    PT Treatment Interventions     PT  Goals (Current goals can be found in the Care Plan section) Acute Rehab PT Goals Patient Stated Goal: get stronger and get back home    Frequency Min 3X/week   Barriers to discharge        Co-evaluation PT/OT/SLP Co-Evaluation/Treatment: Yes Reason for Co-Treatment: Complexity of the patient's impairments (multi-system involvement);For patient/therapist safety PT goals addressed during session: Mobility/safety with mobility;Balance;Strengthening/ROM;Proper use of DME         End of Session Equipment Utilized During Treatment: Gait belt Activity Tolerance: Patient tolerated treatment well Patient left: in chair;with call bell/phone within reach;with family/visitor present Nurse Communication: Mobility status         Time: 1201-1219 PT Time Calculation (min) (ACUTE ONLY): 18 min   Charges:   PT Evaluation $PT Eval Moderate Complexity: 1 Procedure     PT G Codes:       Leighton Roach, PT  Acute Rehab Services  [place], [place] 07/31/2016, 1:02 PM

## 2016-07-31 NOTE — Progress Notes (Signed)
PROGRESS NOTE    Thomas [name]  OTR:[id number] DOB: 1946/07/03 DOA: 07/13/2016 PCP: No PCP Per Patient    Brief Narrative:  70 y.o.M with recent diagnosis of MM (end of April 2017), currently undergoing chemo and radiation. Started on new chemo drug last week (7/31) and developed significant diarrhea afterwards. Had symptoms for 3 - 4 days then on 8/3 began to have N/V and abd pain. Found to have possible acalculous cholecystitis. PCCM asked to admit to ICU due to hypotension.  Barrier to discharge her physical activity and sitting up with hemodialysis. Patient was given 2 units packed red blood cells 8/15 with dialysis. Also started on steroids. Asked CIR to evaluate patient however they feel that patient should be discharged to skilled nursing facility. Patient family adamant about patient going to inpatient rehabilitation. On recent evaluation patient is a lot stronger PT and OT recommending inpatient rehabilitation. Reconsulted inpatient rehabilitation team.   Assessment & Plan:  ESRD on dialysis [facility]) with hypotension - nephrology following, on dialysis on Tuesday, Thursday and Saturday schedule. Continue. Has hypotension which is stable on midodrine.  Sepsis 2/2 klebsiella pneumoniae bacteremia due to acalculous cholecystitis with transaminitis. - BC 8/3 positive,  MRI/MRCP consistent with cholecystitis,  Perc drain on 07/17/16 - minimal drainage noted today.  Should be in place for 6-8 weeks. Transitioned to Levaquin for 3 week course, start date 8/8, clinically better Afebrile, leukocytosis has resolved. Defer management of per gr 2 IR.  Multiple myeloma ([place]) - Follows with Dr. Alvy Bimler, - Given anemia/thrombocytopenia, treatment on hold,  On steroids  Anemia and Thrombocytopenia ([place]) - Oncology consulted, on  Aranesp,  HIT panel negative, continue Steroids,  Received PRBC on 07/25/16 with HD,  Platelets  within range of 80s-120s  Hyponatremia - mild dialysis patient defer management  to nephrology    Goals of Care - Family had declined PC consult  Deconditioning - PT to evaluate, CIR and LTAC consults placed.   DVT prophylaxis: SCDs given platelets Code Status: full Family Communication: daughter and wife at bedside Disposition Plan: Likely discharge in 1-2 days based on his response to PT   Consultants:   Nephrology  PT/OT  Gen Surgery  IR  PCCM  Procedures:   Perc Drain in place, 07/17/16  Antimicrobials:  Levaquin for 3 week course - start date 07/18/16 Stop date 08/08/2016   Subjective:  Patient in bed, denies any chest pain, no fever or chills, minimal right upper quadrant pain at the site of gallbladder drain. No shortness of breath.    Objective: Vitals:   07/30/16 1653 07/30/16 2154 07/31/16 0541 07/31/16 0740  BP: (!) 157/65 (!) 173/80 (!) 150/70 (!) 144/75  Pulse: 72 62 74 69  Resp: 18 19 18 18   Temp: 99 F (37.2 C) 97.7 F (36.5 C) 97.7 F (36.5 C) 97.9 F (36.6 C)  TempSrc: Oral Oral Oral Oral  SpO2: 98% 98% 97% 100%  Weight:  70 kg (154 lb 5.2 oz)    Height:        Intake/Output Summary (Last 24 hours) at 07/31/16 1047 Last data filed at 07/31/16 0900  Gross per 24 hour  Intake              560 ml  Output              750 ml  Net             -190 ml   Filed Weights   07/29/16 0828 07/29/16 1230 07/30/16 2154  Weight: 71.8 kg (158 lb 4.6 oz) 71.3 kg (157 lb 3 oz) 70 kg (154 lb 5.2 oz)    Examination:  General exam: Appears calm and comfortable, thin HENT: MMM, NCAT  Respiratory system: Clear to auscultation. Respiratory effort normal. Chest: Catheter in place in right chest, clean and dry Cardiovascular system: S1 & S2 heard, RR, NR Gastrointestinal system: Abdomen is scaphhoid, soft and nontender.  +BS, gallbladder drain in place, Central nervous system: Alert and oriented. Skin: No rashes, lesions or ulcers Psychiatry: Judgement and insight appear normal. He appears somewhat down    Data Reviewed:    CBC:  Recent Labs Lab 07/26/16 0635 07/27/16 0650 07/28/16 0316 07/29/16 0831 07/31/16 0734  WBC 8.2 7.7 6.9 8.8 7.6  NEUTROABS  --   --  5.0 6.1  --   HGB 10.6* 10.6* 10.1* 10.4* 11.3*  HCT 33.3* 33.4* 32.5* 33.3* 36.5*  MCV 91.2 91.3 92.9 92.5 93.8  PLT 120* 121* 118* 97* 86*   Basic Metabolic Panel:  Recent Labs Lab 07/25/16 0705  07/27/16 0650 07/28/16 0316 07/29/16 0831 07/30/16 0534 07/31/16 0734  NA 135  136  < > 134* 135 132* 136 135  K 4.1  4.1  < > 4.1 4.5 4.1 4.8 5.0  CL 101  99*  < > 98* 99* 100* 99* 104  CO2 29  29  < > 26 27 26 27 25   GLUCOSE 107*  113*  < > 276* 375* 181* 277* 213*  BUN 31*  30*  < > 25* 16 27* 24* 38*  CREATININE 3.13*  3.19*  < > 2.50* 1.86* 2.23* 2.02* 2.34*  CALCIUM 7.9*  8.1*  < > 8.0* 8.0* 8.0* 8.0* 8.1*  PHOS 4.1  --  4.2  --   --   --   --   < > = values in this interval not displayed. GFR: Estimated Creatinine Clearance: 29.9 mL/min (by C-G formula based on SCr of 2.34 mg/dL). Liver Function Tests:  Recent Labs Lab 07/25/16 0705 07/27/16 0650  ALBUMIN 1.7* 1.8*   No results for input(s): LIPASE, AMYLASE in the last 168 hours. No results for input(s): AMMONIA in the last 168 hours. Coagulation Profile: No results for input(s): INR, PROTIME in the last 168 hours. Cardiac Enzymes: No results for input(s): CKTOTAL, CKMB, CKMBINDEX, TROPONINI in the last 168 hours. BNP (last 3 results) No results for input(s): PROBNP in the last 8760 hours. HbA1C:  Recent Labs  07/29/16 0830  HGBA1C 5.8*   CBG:  Recent Labs Lab 07/30/16 0742 07/30/16 1125 07/30/16 1658 07/30/16 2153 07/31/16 0746  GLUCAP 259* 256* 214* 273* 180*   Lipid Profile: No results for input(s): CHOL, HDL, LDLCALC, TRIG, CHOLHDL, LDLDIRECT in the last 72 hours. Thyroid Function Tests: No results for input(s): TSH, T4TOTAL, FREET4, T3FREE, THYROIDAB in the last 72 hours. Anemia Panel: No results for input(s): VITAMINB12, FOLATE,  FERRITIN, TIBC, IRON, RETICCTPCT in the last 72 hours. Sepsis Labs: No results for input(s): PROCALCITON, LATICACIDVEN in the last 168 hours.  Recent Results (from the past 240 hour(s))  C difficile quick scan w PCR reflex     Status: None   Collection Time: 07/27/16  5:56 PM  Result Value Ref Range Status   C Diff antigen NEGATIVE NEGATIVE Final   C Diff toxin NEGATIVE NEGATIVE Final   C Diff interpretation No C. difficile detected.  Final         Radiology Studies: No results found.  Scheduled Meds: . sodium chloride   Intravenous Once  . darbepoetin (ARANESP) injection - DIALYSIS  200 mcg Intravenous Q Sat-HD  . dexamethasone  8 mg Oral Q breakfast  . famotidine  20 mg Oral Daily  . feeding supplement  1 Container Oral TID BM  . fluticasone  1 spray Each Nare BID  . insulin aspart  0-20 Units Subcutaneous TID WC  . lactobacillus acidophilus & bulgar  1 tablet Oral TID WC  . levofloxacin  500 mg Oral Q48H  . midodrine  10 mg Oral TID WC  . multivitamin  1 tablet Oral QHS  . protein supplement  1 scoop Oral TID WC  . vitamin B-12  1,000 mcg Oral Daily  . Vitamin D (Ergocalciferol)  50,000 Units Oral Q7 days   Continuous Infusions:    LOS: 18 days    Time spent: 25 minutes   Signature  SINGH,PRASHANT K M.D on 07/31/2016 at 10:47 AM  Between 7am to 7pm - Pager - [phone], After 7pm go to [email or web address] - password [facility]  [facility]  - Office  [phone]

## 2016-07-31 NOTE — Progress Notes (Signed)
Pt's biliary drain flushed with 13ml of NS.  Carole Civil, RN, [facility] [place] [address] [place] Phone [phone]

## 2016-07-31 NOTE — Care Management Important Message (Signed)
Important Message  Patient Details  Name: Thomas [name] MRN: [id number] Date of Birth: [birth year]   Medicare Important Message Given:  Yes    Falisa Lamora [address] 07/31/2016, 12:04 PM

## 2016-08-01 DIAGNOSIS — R0989 Other specified symptoms and signs involving the circulatory and respiratory systems: Secondary | ICD-10-CM

## 2016-08-01 DIAGNOSIS — R030 Elevated blood-pressure reading, without diagnosis of hypertension: Secondary | ICD-10-CM

## 2016-08-01 DIAGNOSIS — R1010 Upper abdominal pain, unspecified: Secondary | ICD-10-CM

## 2016-08-01 DIAGNOSIS — R52 Pain, unspecified: Secondary | ICD-10-CM

## 2016-08-01 DIAGNOSIS — R739 Hyperglycemia, unspecified: Secondary | ICD-10-CM

## 2016-08-01 DIAGNOSIS — T380X5A Adverse effect of glucocorticoids and synthetic analogues, initial encounter: Secondary | ICD-10-CM

## 2016-08-01 DIAGNOSIS — I10 Essential (primary) hypertension: Secondary | ICD-10-CM

## 2016-08-01 DIAGNOSIS — R7309 Other abnormal glucose: Secondary | ICD-10-CM

## 2016-08-01 LAB — COMPREHENSIVE METABOLIC PANEL
ALT: 92 U/L — AB (ref 17–63)
ANION GAP: 8 (ref 5–15)
AST: 101 U/L — ABNORMAL HIGH (ref 15–41)
Albumin: 2.1 g/dL — ABNORMAL LOW (ref 3.5–5.0)
Alkaline Phosphatase: 145 U/L — ABNORMAL HIGH (ref 38–126)
BUN: 49 mg/dL — ABNORMAL HIGH (ref 6–20)
CHLORIDE: 103 mmol/L (ref 101–111)
CO2: 24 mmol/L (ref 22–32)
CREATININE: 2.45 mg/dL — AB (ref 0.61–1.24)
Calcium: 8 mg/dL — ABNORMAL LOW (ref 8.9–10.3)
GFR, EST AFRICAN AMERICAN: 30 mL/min — AB (ref >60)
GFR, EST NON AFRICAN AMERICAN: 25 mL/min — AB (ref >60)
Glucose, Bld: 194 mg/dL — ABNORMAL HIGH (ref 65–99)
Potassium: 4.7 mmol/L (ref 3.5–5.1)
SODIUM: 135 mmol/L (ref 135–145)
Total Bilirubin: 1.1 mg/dL (ref 0.3–1.2)
Total Protein: 5 g/dL — ABNORMAL LOW (ref 6.5–8.1)

## 2016-08-01 LAB — GLUCOSE, CAPILLARY
Glucose-Capillary: 118 mg/dL — ABNORMAL HIGH (ref 65–99)
Glucose-Capillary: 227 mg/dL — ABNORMAL HIGH (ref 65–99)
Glucose-Capillary: 176 mg/dL — ABNORMAL HIGH (ref 65–99)

## 2016-08-01 LAB — CBC
HCT: 36.7 % — ABNORMAL LOW (ref 39.0–52.0)
HEMOGLOBIN: 11.6 g/dL — AB (ref 13.0–17.0)
MCH: 29.3 pg (ref 26.0–34.0)
MCHC: 31.6 g/dL (ref 30.0–36.0)
MCV: 92.7 fL (ref 78.0–100.0)
PLATELETS: 111 10*3/uL — AB (ref 150–400)
RBC: 3.96 MIL/uL — AB (ref 4.22–5.81)
RDW: 15 % (ref 11.5–15.5)
WBC: 8.8 10*3/uL (ref 4.0–10.5)

## 2016-08-01 MED ORDER — MIDODRINE HCL 5 MG PO TABS
ORAL_TABLET | ORAL | Status: AC
  Filled 2016-08-01: qty 2

## 2016-08-01 MED ORDER — DEXAMETHASONE 4 MG PO TABS
4.0000 mg | ORAL_TABLET | Freq: Every day | ORAL | Status: DC
  Administered 2016-08-02: 4 mg via ORAL
  Filled 2016-08-01: qty 1

## 2016-08-01 MED ORDER — HEPARIN SODIUM (PORCINE) 1000 UNIT/ML DIALYSIS
20.0000 [IU]/kg | INTRAMUSCULAR | Status: DC | PRN
  Administered 2016-08-01: 1400 [IU] via INTRAVENOUS_CENTRAL

## 2016-08-01 NOTE — Progress Notes (Signed)
PROGRESS NOTE    Thomas [name]  [id number]:[id number] DOB: Aug 29, 1946 DOA: 07/13/2016 PCP: No PCP Per Patient    Brief Narrative:  70 y.o.M with recent diagnosis of MM (end of April 2017), currently undergoing chemo and radiation. Started on new chemo drug last week (7/31) and developed significant diarrhea afterwards. Had symptoms for 3 - 4 days then on 8/3 began to have N/V and abd pain. Found to have possible acalculous cholecystitis. PCCM asked to admit to ICU due to hypotension.  Barrier to discharge her physical activity and sitting up with hemodialysis. Patient was given 2 units packed red blood cells 8/15 with dialysis. Also started on steroids. Asked CIR to evaluate patient however they feel that patient should be discharged to skilled nursing facility. Patient family adamant about patient going to inpatient rehabilitation. On recent evaluation patient is a lot stronger PT and OT recommending inpatient rehabilitation. Reconsulted inpatient rehabilitation team.   Assessment & Plan:  ESRD on dialysis [facility]) with hypotension - nephrology following, on dialysis on Tuesday, Thursday and Saturday schedule. Continue. Has hypotension which is stable on midodrine.  Sepsis 2/2 klebsiella pneumoniae bacteremia due to acalculous cholecystitis with transaminitis. - BC 8/3 positive,  MRI/MRCP consistent with cholecystitis,  Perc drain on 07/17/16 - minimal drainage noted today.  Should be in place for 6-8 weeks. Transitioned to Levaquin for 3 week course, start date 8/8, clinically better Afebrile, leukocytosis has resolved. Defer management of per gr 2 IR.  Multiple myeloma ([place]) - Follows with Dr. Alvy Bimler, - Given anemia/thrombocytopenia, treatment on hold,  On steroids  Anemia and Thrombocytopenia ([place]) - Oncology consulted, on  Aranesp,  HIT panel negative, continue Steroids,  Received PRBC on 07/25/16 with HD,  Platelets  within range of 80s-120s  Hyponatremia - dialysis patient defer management to  nephrology    Goals of Care - Family had declined PC consult  Deconditioning - PT to evaluate, CIR and LTAC consults placed.   DVT prophylaxis: SCDs given platelets Code Status: full Family Communication: daughter and wife at bedside Disposition Plan: CIR/SNF in am   Consultants:   Nephrology  PT/OT  Gen Surgery  IR  PCCM  Procedures:   Perc Drain in place, 07/17/16  Antimicrobials:  Levaquin for 3 week course - start date 07/18/16 Stop date 08/08/2016   Subjective:  Patient in bed, denies any chest pain, no fever or chills, minimal right upper quadrant pain at the site of gallbladder drain. No shortness of breath.    Objective: Vitals:   08/01/16 0900 08/01/16 0930 08/01/16 1000 08/01/16 1030  BP: (!) 78/53 94/60 100/61 102/61  Pulse: 88 81 77 80  Resp:      Temp:      TempSrc:      SpO2:      Weight:      Height:        Intake/Output Summary (Last 24 hours) at 08/01/16 1104 Last data filed at 08/01/16 0600  Gross per 24 hour  Intake              940 ml  Output             1145 ml  Net             -205 ml   Filed Weights   07/30/16 2154 07/31/16 2153 08/01/16 0700  Weight: 70 kg (154 lb 5.2 oz) 70.1 kg (154 lb 8.7 oz) 70.1 kg (154 lb 8.7 oz)    Examination:  General exam: Appears calm and  comfortable, thin HENT: MMM, NCAT  Respiratory system: Clear to auscultation. Respiratory effort normal. Chest: Catheter in place in right chest, clean and dry Cardiovascular system: S1 & S2 heard, RR, NR Gastrointestinal system: Abdomen is scaphhoid, soft and nontender.  +BS, gallbladder drain in place, Central nervous system: Alert and oriented. Skin: No rashes, lesions or ulcers Psychiatry: Judgement and insight appear normal. He appears somewhat down    Data Reviewed:   CBC:  Recent Labs Lab 07/27/16 0650 07/28/16 0316 07/29/16 0831 07/31/16 0734 08/01/16 0742  WBC 7.7 6.9 8.8 7.6 8.8  NEUTROABS  --  5.0 6.1  --   --   HGB 10.6* 10.1* 10.4*  11.3* 11.6*  HCT 33.4* 32.5* 33.3* 36.5* 36.7*  MCV 91.3 92.9 92.5 93.8 92.7  PLT 121* 118* 97* 86* 412*   Basic Metabolic Panel:  Recent Labs Lab 07/27/16 0650 07/28/16 0316 07/29/16 0831 07/30/16 0534 07/31/16 0734 08/01/16 0742  NA 134* 135 132* 136 135 135  K 4.1 4.5 4.1 4.8 5.0 4.7  CL 98* 99* 100* 99* 104 103  CO2 26 27 26 27 25 24   GLUCOSE 276* 375* 181* 277* 213* 194*  BUN 25* 16 27* 24* 38* 49*  CREATININE 2.50* 1.86* 2.23* 2.02* 2.34* 2.45*  CALCIUM 8.0* 8.0* 8.0* 8.0* 8.1* 8.0*  PHOS 4.2  --   --   --   --   --    GFR: Estimated Creatinine Clearance: 28.6 mL/min (by C-G formula based on SCr of 2.45 mg/dL). Liver Function Tests:  Recent Labs Lab 07/27/16 0650 08/01/16 0742  AST  --  101*  ALT  --  92*  ALKPHOS  --  145*  BILITOT  --  1.1  PROT  --  5.0*  ALBUMIN 1.8* 2.1*   No results for input(s): LIPASE, AMYLASE in the last 168 hours. No results for input(s): AMMONIA in the last 168 hours. Coagulation Profile: No results for input(s): INR, PROTIME in the last 168 hours. Cardiac Enzymes: No results for input(s): CKTOTAL, CKMB, CKMBINDEX, TROPONINI in the last 168 hours. BNP (last 3 results) No results for input(s): PROBNP in the last 8760 hours. HbA1C: No results for input(s): HGBA1C in the last 72 hours. CBG:  Recent Labs Lab 07/30/16 2153 07/31/16 0746 07/31/16 1150 07/31/16 1708 07/31/16 2144  GLUCAP 273* 180* 229* 339* 207*   Lipid Profile: No results for input(s): CHOL, HDL, LDLCALC, TRIG, CHOLHDL, LDLDIRECT in the last 72 hours. Thyroid Function Tests: No results for input(s): TSH, T4TOTAL, FREET4, T3FREE, THYROIDAB in the last 72 hours. Anemia Panel: No results for input(s): VITAMINB12, FOLATE, FERRITIN, TIBC, IRON, RETICCTPCT in the last 72 hours. Sepsis Labs: No results for input(s): PROCALCITON, LATICACIDVEN in the last 168 hours.  Recent Results (from the past 240 hour(s))  C difficile quick scan w PCR reflex     Status:  None   Collection Time: 07/27/16  5:56 PM  Result Value Ref Range Status   C Diff antigen NEGATIVE NEGATIVE Final   C Diff toxin NEGATIVE NEGATIVE Final   C Diff interpretation No C. difficile detected.  Final    Radiology Studies: No results found.   Scheduled Meds: . sodium chloride   Intravenous Once  . darbepoetin (ARANESP) injection - DIALYSIS  200 mcg Intravenous Q Sat-HD  . [START ON 08/02/2016] dexamethasone  4 mg Oral Q breakfast  . famotidine  20 mg Oral Daily  . feeding supplement (ENSURE ENLIVE)  237 mL Oral BID BM  . feeding supplement (  PRO-STAT SUGAR FREE 64)  30 mL Oral BID  . fluticasone  1 spray Each Nare BID  . insulin aspart  0-20 Units Subcutaneous TID WC  . lactobacillus acidophilus & bulgar  1 tablet Oral TID WC  . levofloxacin  500 mg Oral Q48H  . midodrine  10 mg Oral TID WC  . multivitamin  1 tablet Oral QHS  . protein supplement  1 scoop Oral TID WC  . vitamin B-12  1,000 mcg Oral Daily  . Vitamin D (Ergocalciferol)  50,000 Units Oral Q7 days   Continuous Infusions:    LOS: 19 days    Time spent: 25 minutes   Signature  SINGH,PRASHANT K M.D on 08/01/2016 at 11:04 AM  Between 7am to 7pm - Pager - [phone], After 7pm go to [email or web address] - password [facility]  [facility]  - Office  [phone]

## 2016-08-01 NOTE — Progress Notes (Signed)
Rehab admissions - I have faxed clinicals to [facility] [employer] requesting acute inpatient rehab admission.  I will follow up once we hear back from insurance case manager.  Call me for questions.  CK:[phone]

## 2016-08-01 NOTE — Care Management Note (Signed)
Case Management Note  Patient Details  Name: Thomas [name] MRN: [id number] Date of Birth: 09/02/1946  Subjective/Objective:     CM following for progression and d/c planning.                Action/Plan: 08/01/2016 Per MD pt states that he is d/c to CIR, however no notes from CIR since 07/26/16 at which time this pt was determined not to be a candidate. Pt has increased activity in the past few days and maybe reconsidered. Noted order for eval by Dr Candiss Norse 08/01/16 and this CM contacted [place], CIR coordinator, re possible reeval for admission.  Await CIR eval and decision.   Expected Discharge Date:                  Expected Discharge Plan:  [place]  In-House Referral:  NA  Discharge planning Services  CM Consult  Post Acute Care Choice:  Home Health Choice offered to:  Spouse  DME Arranged:  N/A DME Agency:  NA  HH Arranged:  RN, PT, OT, Nurse's Aide [place] Agency:  [place]  Status of Service:  Completed, signed off  If discussed at [place] of Stay Meetings, dates discussed:    Additional Comments:  Adron Bene, RN 08/01/2016, 10:08 AM

## 2016-08-01 NOTE — Progress Notes (Signed)
[facility] Progress Note  Assessment/Plan: 1.Abdominal pain/nausea/vomiting/^ LFT- LFT today elevated - this is new from last check 8/11; however, N, V resolved: cholecystitis-sp IR perc chole drainage 8/07 Blood Cx:1/2 klebsiellapneumoniae. On Levaquin po x 3wk.  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 Failed trial of holding dialysis.-tolerating HD in chair today K 4.7 - next HD Thursday; has some residual function - Cr in mid 2s - I/O 1625 Monday - 550 so far today - we are not restricting fluids 3. 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  6.Volume/HTN -  on midodrine BP have been labile - drop with low goal of 1L on HD -keep even the rest of HD today -only has ankle edema 7. Debility -Is being evaluated for inpatient rehab- he is interested in this 8. Nutrition: Albumin 2.1 on reg diet. renal vit/protein supplement. 9. Vascular access- LUE BVT 1st stage (placed 05/24/16), +T/B, maturing. He missed his outpatient appt with Dr. Bridgett Larsson. If he remains in hospital we may have him check on fistula and schedule second stage of BVT. - will do if he is transferred to rehab 10.  Debility/ disposition - is stronger today and walked in his room, w minimal help sat on side of bed today.  Is in the chair now and going to try to stay there for a full 4 hours.  HD tomorrow in the chair - dramatically improved over the past few days   12. Thrombocytopenia - plts were trending down but up to 111 today  Myriam Jacobson, PA-C [place] [phone] 08/01/2016,9:03 AM  LOS: 19 days   Pt seen, examined and agree w A/P as above.  [name] MD [facility]  [facility] pager [phone]    cell [phone] 08/01/2016, 10:20 AM    Subjective:   No problems tolerating HD in recliner; the chairs in the room are uncomfortable for him.  Objective Vitals:   08/01/16 0710 08/01/16 0730 08/01/16 0800 08/01/16 0830  BP: 114/65 (!) 93/58 (!) 88/60 (!) 89/57  Pulse: 77 89 93 90  Resp: 17     Temp:      TempSrc:      SpO2:      Weight:      Height:       Physical Exam General: NAD in recliner on HD Heart: RRR Lungs:  No rales Abdomen:soft tender at biliary drain site Extremities: + ankle edema Dialysis Access: right ij and maturing left BC AVF  Dialysis Orders: TTS SW 4h 73kg 3K/ 2.25 bath Hep 7300 R IJ cath/ maturing L BCF (05/24/16) Aranesp 40 g weekly pth 133 no vit d   Additional Objective Labs: Basic Metabolic Panel:  Recent Labs Lab 07/27/16 0650  07/30/16 0534 07/31/16 0734 08/01/16 0742  NA 134*  < > 136 135 135  K 4.1  < > 4.8 5.0 4.7  CL 98*  < > 99* 104 103  CO2 26  < > 27 25 24   GLUCOSE 276*  < > 277* 213* 194*  BUN 25*  < > 24* 38* 49*  CREATININE 2.50*  < > 2.02* 2.34* 2.45*  CALCIUM 8.0*  < > 8.0* 8.1* 8.0*  PHOS 4.2  --   --   --   --   < > = values in this interval not displayed. Liver Function Tests:  Recent Labs Lab 07/27/16 0650 08/01/16 0742  AST  --  101*  ALT  --  92*  ALKPHOS  --  145*  BILITOT  --  1.1  PROT  --  5.0*  ALBUMIN 1.8* 2.1*   No results for input(s): LIPASE, AMYLASE in the last 168 hours. CBC:  Recent Labs Lab 07/27/16 0650 07/28/16 0316 07/29/16 0831 07/31/16 0734 08/01/16 0742  WBC 7.7 6.9 8.8 7.6 8.8  NEUTROABS  --  5.0 6.1  --   --   HGB 10.6* 10.1* 10.4* 11.3* 11.6*  HCT 33.4* 32.5* 33.3* 36.5* 36.7*  MCV 91.3 92.9 92.5 93.8 92.7  PLT 121* 118* 97* 86* 111*   Blood Culture    Component Value Date/Time   SDES DRAINAGE BILE 07/17/2016 1718   SPECREQUEST Normal 07/17/2016 1718   CULT  07/17/2016 1718    MODERATE ENTEROBACTER CLOACAE MODERATE  KLEBSIELLA PNEUMONIAE NO ANAEROBES ISOLATED    REPTSTATUS 07/22/2016 FINAL 07/17/2016 1718    Cardiac Enzymes: No results for input(s): CKTOTAL, CKMB, CKMBINDEX, TROPONINI in the last 168 hours. CBG:  Recent Labs Lab 07/30/16 2153 07/31/16 0746 07/31/16 1150 07/31/16 1708 07/31/16 2144  GLUCAP 273* 180* 229* 339* 207*   Iron Studies: No results for input(s): IRON, TIBC, TRANSFERRIN, FERRITIN in the last 72 hours. Lab Results  Component Value Date   INR 1.65 07/17/2016   INR 1.46 05/11/2016   INR 1.40 05/10/2016   Studies/Results: No results found. Medications:   . sodium chloride   Intravenous Once  . darbepoetin (ARANESP) injection - DIALYSIS  200 mcg Intravenous Q Sat-HD  . [START ON 08/02/2016] dexamethasone  4 mg Oral Q breakfast  . famotidine  20 mg Oral Daily  . feeding supplement (ENSURE ENLIVE)  237 mL Oral BID BM  . feeding supplement (PRO-STAT SUGAR FREE 64)  30 mL Oral BID  . fluticasone  1 spray Each Nare BID  . insulin aspart  0-20 Units Subcutaneous TID WC  . lactobacillus acidophilus & bulgar  1 tablet Oral TID WC  . levofloxacin  500 mg Oral Q48H  . midodrine  10 mg Oral TID WC  . multivitamin  1 tablet Oral QHS  . protein supplement  1 scoop Oral TID WC  . vitamin B-12  1,000 mcg Oral Daily  . Vitamin D (Ergocalciferol)  50,000 Units Oral Q7 days

## 2016-08-01 NOTE — Consult Note (Signed)
Physical Medicine and Rehabilitation Consult Reason for Consult: Debilitation related to multiple myeloma with metastasis to the spine/sepsis due to cholecystitis Referring Physician: [facility]   HPI: Thomas [name] is a 70 y.o. 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 Patient well known to inpatient rehabilitation services from June 2017 for debilitation related to multiple myeloma. He was discharged to home with his wife ambulating 120 feet with a rolling walker and standby assist. Patient undergoing chemotherapy and radiation to left femur followed by Dr. Alvy Bimler. Presented 07/13/2012 with nausea, vomiting and abdominal pain with intermittent bouts of diarrhea. In the ED patient was hypotensive and received IV fluids. Abdominal ultrasound question cholecystitis. WBC up to 48,000 total bilirubin 5.6. Alkaline phosphatase also elevated. Attempt at a HIDA scan but did not visualize the gallbladder. MRCP suggested possible acute cholecystitis. Interventional radiology with placement of drainage tube on 07/17/2016. Placed on broad-spectrum antibiotics. White blood cell count has improved to 7600. Follow-up oncology services for history of multiple myeloma. Patient had developed pancytopenia and recent treatment placed on hold continue to be monitored. Hemodialysis follow-up currently on a Tuesday Thursday Saturday dialysis schedule. Patient was slow functional progress. Recommendations for CIR versus LTAC.   Review of Systems  Constitutional: Negative for chills and fever.  HENT: Negative for hearing loss.   Eyes: Negative for blurred vision and double vision.  Respiratory: Positive for shortness of breath. Negative for cough.   Cardiovascular: Positive for leg swelling. Negative for chest pain and palpitations.    Gastrointestinal: Positive for diarrhea, nausea and vomiting.  Genitourinary: Negative for hematuria.  Musculoskeletal: Positive for back pain and myalgias.  Skin: Negative for rash.  Neurological: Positive for weakness. Negative for seizures and headaches.  All other systems reviewed and are negative.  Past Medical History:  Diagnosis Date  . Anemia    ANEMIA OF RENAL DISEASE  . Cancer ([place])   . Chronic kidney disease   . Compression fracture   . Elevated LFTs 07/2016  . Thrombocytopenia ([place]) 07/2016   Past Surgical History:  Procedure Laterality Date  . AV FISTULA PLACEMENT Left 05/24/2016   Procedure: ARTERIOVENOUS (AV) FISTULA CREATION-LEFT;  Surgeon: Conrad Graball, MD;  Location: [facility];  Service: Vascular;  Laterality: Left;  . BONE BIOPSY Left 05/14/2016   Procedure: LEFT FEMORAL BIOPSY WITH INTRAOPERATIVE FROZEN SECTIONS ;  Surgeon: Rod Can, MD;  Location: [facility];  Service: Orthopedics;  Laterality: Left;  . FEMUR IM NAIL Left 05/14/2016   Procedure: INTRAMEDULLARY (IM) NAIL FEMORAL;  Surgeon: Rod Can, MD;  Location: [facility];  Service: Orthopedics;  Laterality: Left;  . IR GENERIC HISTORICAL  07/17/2016   IR PERC CHOLECYSTOSTOMY 07/17/2016 Arne Cleveland, MD [facility]  . MOUTH SURGERY    . SP CHOLECYSTOMY  07/2016   cholecystostomy drain placed under [place] and fluoro   Family History  Problem Relation Age of Onset  . Diabetes Father    Social History:  reports that he has quit smoking. He has never used smokeless tobacco. He reports that he does not drink alcohol or use drugs. Allergies:  Allergies  Allergen Reactions  . Penicillins Rash and Other (See Comments)    Has patient had a PCN reaction causing immediate rash, facial/tongue/throat swelling, SOB or lightheadedness with hypotension: YES + Reaction causing SEVERE RASH involving MUCUS MEMBRANES or  SKIN NECROSIS >> YES Reaction that required hospitalization: NO Reaction occurring within the last 10 years:  NO If all of the above answers are "NO", then may proceed with Cephalosporin use.   . Dilaudid [Hydromorphone Hcl] Other (See Comments)    hallucinations  . Fish Allergy Nausea And Vomiting  . Codeine Other (See Comments)    "MAKES ME JUMPY"   Medications Prior to Admission  Medication Sig Dispense Refill  . acetaminophen (TYLENOL) 325 MG tablet Take 2 tablets (650 mg total) by mouth every 6 (six) hours as needed for mild pain (or Fever >/= 101). 15 tablet 0  . acyclovir (ZOVIRAX) 200 MG capsule Take 1 capsule (200 mg total) by mouth 2 (two) times daily. 60 capsule 9  . ALPRAZolam (XANAX) 0.25 MG tablet Take 1 tablet (0.25 mg total) by mouth 2 (two) times daily as needed for anxiety. 30 tablet 0  . calcium acetate (PHOSLO) 667 MG capsule Take 1 capsule (667 mg total) by mouth 2 (two) times daily with a meal. 60 capsule 1  . cyanocobalamin 1000 MCG tablet Take 1 tablet (1,000 mcg total) by mouth daily. 30 tablet 0  . dexamethasone (DECADRON) 4 MG tablet Take 5 tablets (20 mg total) by mouth once a week. 60 tablet 1  . diphenoxylate-atropine (LOMOTIL) 2.5-0.025 MG tablet Take 1 tablet by mouth 4 (four) times daily as needed for diarrhea or loose stools. 90 tablet 0  . lenalidomide (REVLIMID) 2.5 MG capsule Take 1 capsule daily for 14 days, then off 7 days 14 capsule 11  . loperamide (IMODIUM A-D) 2 MG tablet Take 2 mg by mouth 4 (four) times daily as needed for diarrhea or loose stools.    Marland Kitchen loratadine (CLARITIN) 10 MG tablet Take 10 mg by mouth daily as needed for allergies.     . multivitamin (RENA-VIT) TABS tablet Take 1 tablet by mouth at bedtime. 30 tablet 0  . ondansetron (ZOFRAN) 8 MG tablet Take 1 tablet (8 mg total) by mouth every 8 (eight) hours as needed for nausea. 30 tablet 3  . pantoprazole (PROTONIX) 40 MG tablet Take 1 tablet (40 mg total) by mouth daily. 30 tablet 1  . tobramycin (TOBREX) 0.3 % ophthalmic solution Place 1 drop into both eyes every 4 (four) hours. 5 mL 0  .  triamcinolone (NASACORT AQ) 55 MCG/ACT AERO nasal inhaler Place 2 sprays into the nose daily. 1 Inhaler 1  . Vitamin D, Ergocalciferol, (DRISDOL) 50000 units CAPS capsule Take 1 capsule (50,000 Units total) by mouth every 7 (seven) days. 60 capsule 0    Home: Home Living Family/patient expects to be discharged to:: Private residence Living Arrangements: Spouse/significant other Available Help at Discharge: Family, Available 24 hours/day Type of Home: House Home Access: Ramped entrance Entrance Stairs-Number of Steps: 1 step down and then 1 step up into home Entrance Stairs-Rails: Right Home Layout: One level Bathroom Shower/Tub: Multimedia programmer: Standard Home Equipment: Bedside commode, Shower seat Additional Comments: wife and daughter expressed wantto take pt home as he was doing well  Functional History: Prior Function Level of Independence: Independent with assistive device(s) Comments: pt and family are very tight knit and "do everything together", likes to do yard work, go to [employer], church on sundays Functional Status:  Mobility: Bed Mobility Overal bed mobility: Needs Assistance Bed Mobility: Supine to Sit Supine to sit: Min guard General bed mobility comments: OOB in chair Transfers Overall transfer level: Needs assistance Equipment used: Rolling walker (2 wheeled)  Transfers: Sit to/from Stand Sit to Stand: +2 safety/equipment, Min assist Stand pivot transfers: Min assist, +2 safety/equipment  Lateral/Scoot Transfers: Mod assist General transfer comment: A to power up Ambulation/Gait Ambulation/Gait assistance: Min assist, +2 safety/equipment Ambulation Distance (Feet): 25 Feet Assistive device: Rolling walker (2 wheeled) Gait Pattern/deviations: Step-through pattern, Shuffle, Decreased weight shift to left General Gait Details: pt requested to sit after 25' due to left knee feeling that it would give way (still recovering from rod  placement in left femur 6/17).  Gait velocity: decreased Gait velocity interpretation: <1.8 ft/sec, indicative of risk for recurrent falls    ADL: ADL Overall ADL's : Needs assistance/impaired Eating/Feeding: Minimal assistance Grooming: Set up, Sitting Upper Body Bathing: Sitting, Set up Lower Body Bathing: Moderate assistance, Sit to/from stand Upper Body Dressing : Set up, Sitting Lower Body Dressing: Moderate assistance, Sit to/from stand Toilet Transfer: Minimal assistance, RW, +2 for physical assistance Toilet Transfer Details (indicate cue type and reason): simulated bed to recliner Toileting- Clothing Manipulation and Hygiene: Moderate assistance Functional mobility during ADLs: Minimal assistance, +2 for safety/equipment, Rolling walker General ADL Comments: Pt and wife able to get his pants on today  Cognition: Cognition Overall Cognitive Status: Within Functional Limits for tasks assessed Orientation Level: Oriented X4 Cognition Arousal/Alertness: Awake/alert Behavior During Therapy: WFL for tasks assessed/performed Overall Cognitive Status: Within Functional Limits for tasks assessed  Blood pressure (!) 159/70, pulse 65, temperature 97.5 F (36.4 C), temperature source Oral, resp. rate 18, height 6' 1"  (1.854 m), weight 70.1 kg (154 lb 8.7 oz), SpO2 100 %. Physical Exam  Vitals reviewed. Constitutional: He is oriented to person, place, and time. He appears well-developed.  Frail  HENT:  Head: Normocephalic and atraumatic.  Eyes: Conjunctivae and EOM are normal.  Neck: Normal range of motion. Neck supple. No thyromegaly present.  Cardiovascular: Normal rate and regular rhythm.   Respiratory: Effort normal and breath sounds normal. No respiratory distress.  GI: Soft. Bowel sounds are normal. He exhibits no distension.  Drain in place  Musculoskeletal: He exhibits no edema or tenderness.  Neurological: He is alert and oriented to person, place, and time.    Sensation diminished to light touch in right hand and foot DTRs symmetric Motor: B/l UE: 4/5 proximal to distal RLE: Hip flexion 2+/5, knee extension 3/5, ankle dorsi/plantar flexion 4/5 LLE: hip flexion 2/5, knee extension 3-/5, ankle dorsi/plantar flexion 4-/5  Skin: Skin is warm and dry.  Dialysis site c/d/i  Psychiatric: He has a normal mood and affect. His behavior is normal.    Results for orders placed or performed during the hospital encounter of 07/13/16 (from the past 24 hour(s))  Basic metabolic panel     Status: Abnormal   Collection Time: 07/31/16  7:34 AM  Result Value Ref Range   Sodium 135 135 - 145 mmol/L   Potassium 5.0 3.5 - 5.1 mmol/L   Chloride 104 101 - 111 mmol/L   CO2 25 22 - 32 mmol/L   Glucose, Bld 213 (H) 65 - 99 mg/dL   BUN 38 (H) 6 - 20 mg/dL   Creatinine, Ser 2.34 (H) 0.61 - 1.24 mg/dL   Calcium 8.1 (L) 8.9 - 10.3 mg/dL   GFR calc non Af Amer 27 (L) >60 mL/min   GFR calc Af Amer 31 (L) >60 mL/min   Anion gap 6 5 - 15  CBC     Status: Abnormal   Collection Time: 07/31/16  7:34 AM  Result Value Ref Range   WBC  7.6 4.0 - 10.5 K/uL   RBC 3.89 (L) 4.22 - 5.81 MIL/uL   Hemoglobin 11.3 (L) 13.0 - 17.0 g/dL   HCT 36.5 (L) 39.0 - 52.0 %   MCV 93.8 78.0 - 100.0 fL   MCH 29.0 26.0 - 34.0 pg   MCHC 31.0 30.0 - 36.0 g/dL   RDW 15.2 11.5 - 15.5 %   Platelets 86 (L) 150 - 400 K/uL  Glucose, capillary     Status: Abnormal   Collection Time: 07/31/16  7:46 AM  Result Value Ref Range   Glucose-Capillary 180 (H) 65 - 99 mg/dL  Glucose, capillary     Status: Abnormal   Collection Time: 07/31/16 11:50 AM  Result Value Ref Range   Glucose-Capillary 229 (H) 65 - 99 mg/dL   Comment 1 Notify RN    Comment 2 Document in Chart   Glucose, capillary     Status: Abnormal   Collection Time: 07/31/16  5:08 PM  Result Value Ref Range   Glucose-Capillary 339 (H) 65 - 99 mg/dL  Glucose, capillary     Status: Abnormal   Collection Time: 07/31/16  9:44 PM  Result Value  Ref Range   Glucose-Capillary 207 (H) 65 - 99 mg/dL   No results found.  Assessment/Plan: Diagnosis: Debilitation  Labs and images independently reviewed.  Records reviewed and summated above.  1. Does the need for close, 24 hr/day medical supervision in concert with the patient's rehab needs make it unreasonable for this patient to be served in a less intensive setting? Yes Co-Morbidities requiring supervision/potential complications: ESRD (recs per nephro), multiple myeloma (cont recs per Heme/Onc), labile BP (cont to monitor, adjust meds in accordance with activity and HD), labile blood glucose, hyperglycemia (cont to monitor), anemia of chronic disease (transfuse if necessary to ensure appropriate perfusion for increased activity tolerance), Thrombocytopenia (< 60,000/mm3 no resistive exercise) 2. Due to safety, skin/wound care, disease management, pain management and patient education, does the patient require 24 hr/day rehab nursing? Yes 3. Does the patient require coordinated care of a physician, rehab nurse, PT (1-2 hrs/day, 5 days/week) and OT (1-2 hrs/day, 5 days/week) to address physical and functional deficits in the context of the above medical diagnosis(es)? Yes Addressing deficits in the following areas: balance, endurance, locomotion, strength, transferring, bathing, dressing, toileting and psychosocial support 4. Can the patient actively participate in an intensive therapy program of at least 3 hrs of therapy per day at least 5 days per week? Yes 5. The potential for patient to make measurable gains while on inpatient rehab is good 6. Anticipated functional outcomes upon discharge from inpatient rehab are modified independent and supervision  with PT, modified independent with OT, n/a with SLP. 7. Estimated rehab length of stay to reach the above functional goals is: 12-15 days. 8. Does the patient have adequate social supports and living environment to accommodate these discharge  functional goals? Yes 9. Anticipated D/C setting: Home 10. Anticipated post D/C treatments: HH therapy and Home excercise program 11. Overall Rehab/Functional Prognosis: fair  RECOMMENDATIONS: This patient's condition is appropriate for continued rehabilitative care in the following setting: CIR Patient has agreed to participate in recommended program. Yes Note that insurance prior authorization may be required for reimbursement for recommended care.  Comment: Rehab Admissions Coordinator to follow up.  Delice Lesch, MD 08/01/2016

## 2016-08-01 NOTE — Progress Notes (Signed)
Inpatient Diabetes Program Recommendations  AACE/ADA: New Consensus Statement on Inpatient Glycemic Control (2015)  Target Ranges:  Prepandial:   less than 140 mg/dL      Peak postprandial:   less than 180 mg/dL (1-2 hours)      Critically ill patients:  140 - 180 mg/dL   Lab Results  Component Value Date   GLUCAP 118 (H) 08/01/2016   HGBA1C 5.8 (H) 07/29/2016    Review of Glycemic Control:  Results for DANY, DELAO (MRN [id number]) as of 08/01/2016 14:37  Ref. Range 07/31/2016 07:46 07/31/2016 11:50 07/31/2016 17:08 07/31/2016 21:44 08/01/2016 11:59  Glucose-Capillary Latest Ref Range: 65 - 99 mg/dL 180 (H) 229 (H) 339 (H) 207 (H) 118 (H)    Diabetes history: Hyper glycemia with steroids Outpatient Diabetes medications: None Current orders for Inpatient glycemic control:  Novolog resistant tid with meals, Decadron 4 mg daily  Inpatient Diabetes Program Recommendations:  Note that Decadron reduced today.  If post-prandial blood sugars remain elevated, may consider adding Novolog meal coverage 3 units tid with meals (hold if patient eats < 50%).  Thanks, Adah Perl, RN, BC-ADM Inpatient Diabetes Coordinator Pager [phone] (8a-5p)

## 2016-08-01 NOTE — Progress Notes (Signed)
I spoke with his wife. The patient is a hemodialysis and he is not seen. His chart is reviewed. Noted hyperglycemia secondary to dexamethasone. According to his wife, the patient is eating very well. I recommend reducing dexamethasone to 4 mg daily and reassess next week.

## 2016-08-02 ENCOUNTER — Other Ambulatory Visit: Payer: Self-pay | Admitting: Hematology and Oncology

## 2016-08-02 ENCOUNTER — Inpatient Hospital Stay (HOSPITAL_COMMUNITY): Payer: PPO

## 2016-08-02 DIAGNOSIS — C9000 Multiple myeloma not having achieved remission: Secondary | ICD-10-CM

## 2016-08-02 LAB — HEPATIC FUNCTION PANEL
ALK PHOS: 137 U/L — AB (ref 38–126)
ALT: 68 U/L — AB (ref 17–63)
AST: 47 U/L — AB (ref 15–41)
Albumin: 2 g/dL — ABNORMAL LOW (ref 3.5–5.0)
Bilirubin, Direct: 0.3 mg/dL (ref 0.1–0.5)
Indirect Bilirubin: 0.6 mg/dL (ref 0.3–0.9)
TOTAL PROTEIN: 4.7 g/dL — AB (ref 6.5–8.1)
Total Bilirubin: 0.9 mg/dL (ref 0.3–1.2)

## 2016-08-02 LAB — GLUCOSE, CAPILLARY
Glucose-Capillary: 184 mg/dL — ABNORMAL HIGH (ref 65–99)
Glucose-Capillary: 189 mg/dL — ABNORMAL HIGH (ref 65–99)
Glucose-Capillary: 223 mg/dL — ABNORMAL HIGH (ref 65–99)

## 2016-08-02 MED ORDER — OXYCODONE HCL 5 MG PO TABS: 5.0000 mg | ORAL_TABLET | ORAL | 0 refills | Status: DC | PRN

## 2016-08-02 MED ORDER — SIMETHICONE 80 MG PO CHEW
80.0000 mg | CHEWABLE_TABLET | Freq: Once | ORAL | Status: AC
  Administered 2016-08-02: 80 mg via ORAL
  Filled 2016-08-02: qty 1

## 2016-08-02 MED ORDER — DEXAMETHASONE 4 MG PO TABS
4.0000 mg | ORAL_TABLET | Freq: Every day | ORAL | Status: DC
Start: 2016-08-02 — End: 2016-09-03

## 2016-08-02 MED ORDER — ALPRAZOLAM 0.25 MG PO TABS: 0.2500 mg | ORAL_TABLET | Freq: Two times a day (BID) | ORAL | 0 refills | Status: DC | PRN

## 2016-08-02 MED ORDER — INSULIN ASPART 100 UNIT/ML ~~LOC~~ SOLN: SUBCUTANEOUS | 12 refills | Status: DC

## 2016-08-02 MED ORDER — BISACODYL 10 MG RE SUPP: 10.0000 mg | Freq: Every day | RECTAL | 0 refills | Status: DC | PRN

## 2016-08-02 MED ORDER — POLYETHYLENE GLYCOL 3350 17 G PO PACK: 17.0000 g | PACK | Freq: Every day | ORAL | 0 refills | Status: DC | PRN

## 2016-08-02 MED ORDER — BISACODYL 10 MG RE SUPP
10.0000 mg | Freq: Once | RECTAL | Status: DC
Start: 2016-08-02 — End: 2016-08-02
  Filled 2016-08-02: qty 1

## 2016-08-02 MED ORDER — ONDANSETRON HCL 4 MG/2ML IJ SOLN
4.0000 mg | Freq: Once | INTRAMUSCULAR | Status: AC
  Administered 2016-08-02: 4 mg via INTRAVENOUS
  Filled 2016-08-02: qty 2

## 2016-08-02 MED ORDER — MIDODRINE HCL 10 MG PO TABS: 10.0000 mg | ORAL_TABLET | Freq: Three times a day (TID) | ORAL | Status: DC

## 2016-08-02 MED ORDER — BENEPROTEIN PO POWD: 1.0000 | Freq: Three times a day (TID) | ORAL | 0 refills | Status: DC

## 2016-08-02 MED ORDER — LEVOFLOXACIN 500 MG PO TABS: 500.0000 mg | ORAL_TABLET | ORAL | Status: AC

## 2016-08-02 NOTE — Progress Notes (Signed)
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 He presented on  8/3 with one-day history of nausea, vomiting and abdominal pain Pt did have percutaneous cholecystomy drain placed 8/7.      PT Comments    Noting very good improvements in functional mobility and activity tolerance; Able to double the distance he walked last session; Elevated seat height to help with the transfer back to bed; Discussed seating options with NT -- they are ordering an air pressure redistribution cushion;   Continue to recommend comprehensive inpatient rehab (CIR) for post-acute therapy needs.   Follow Up Recommendations  CIR     Equipment Recommendations  None recommended by PT    Recommendations for Other Services       Precautions / Restrictions Precautions Precautions: Fall Precaution Comments: Biliary tube R side Restrictions Weight Bearing Restrictions: No    Mobility  Bed Mobility Overal bed mobility: Needs Assistance Bed Mobility: Supine to Sit     Supine to sit: Min guard     General bed mobility comments: Slow moving, using rails to push up; minguard for safety, cues to self-monitor for activity tolerance; not needing physical assist  Transfers Overall transfer level: Needs assistance Equipment used: Rolling walker (2 wheeled) Transfers: Sit to/from Stand           General transfer comment: OT assisted with sit to stand  Ambulation/Gait Ambulation/Gait assistance: Min guard;+2 safety/equipment Ambulation Distance (Feet): 60 Feet Assistive device:  Rolling walker (2 wheeled) Gait Pattern/deviations: Step-through pattern;Decreased stride length Gait velocity: decreased   General Gait Details: Cues to self-monitor for activity tolerance; Heavily reliant on UE support on RW   Stairs            Wheelchair Mobility    Modified Rankin (Stroke Patients Only)       Balance Overall balance assessment: Needs assistance Sitting-balance support: Single extremity supported Sitting balance-Leahy Scale: Fair       Standing balance-Leahy Scale: Poor                      Cognition Arousal/Alertness: Awake/alert Behavior During Therapy: WFL for tasks assessed/performed Overall Cognitive Status: Within Functional Limits for tasks assessed                      Exercises General Exercises - Lower Extremity Pt was working on therex that he had been given during last admission when we entered; encouraged him to continue his exercises independently    General Comments General comments (skin integrity, edema, etc.): No reported dizziness with amb and upright activity; BP after walk, taken sitting, 162/92 -- this can be attributible to incr activity and incr pain      Pertinent Vitals/Pain Pain Assessment: Faces Faces Pain Scale: Hurts little more Pain Location: R trunk at drain site; L hip pain with more walking Pain Descriptors / Indicators: Discomfort Pain Intervention(s): Monitored during session    Home Living                      Prior Function  PT Goals (current goals can now be found in the care plan section) Acute Rehab PT Goals Patient Stated Goal: get stronger and get back home Progress towards PT goals: Goals met and updated - see care plan    Frequency  Min 3X/week    PT Plan Current plan remains appropriate    Co-evaluation PT/OT/SLP Co-Evaluation/Treatment: Yes Reason for Co-Treatment: Complexity of the patient's impairments (multi-system involvement);For  patient/therapist safety PT goals addressed during session: Mobility/safety with mobility       End of Session Equipment Utilized During Treatment: Gait belt Activity Tolerance: Patient tolerated treatment well Patient left: in chair;with call bell/phone within reach (seat height elevated with bedspreads)     Time: 1566-4830 PT Time Calculation (min) (ACUTE ONLY): 30 min  Charges:  $Gait Training: 8-22 mins                    G Codes:      Quin Hoop 08/02/2016, 12:13 PM  Roney Marion, [place] Pager [phone] Office [phone]

## 2016-08-02 NOTE — Clinical Social Work Placement (Signed)
   CLINICAL SOCIAL WORK PLACEMENT  NOTE 08/02/16 - DISCHARGED TO [facility]  Date:  08/02/2016  Patient Details  Name: Thomas [name] MRN: [id number] Date of Birth: Nov 03, 1946  Clinical Social Work is seeking post-discharge placement for this patient at the [place] level of care (*CSW will initial, date and re-position this form in  chart as items are completed):  Yes   Patient/family provided with [facility] Work Department's list of facilities offering this level of care within the geographic area requested by the patient (or if unable, by the patient's family).  Yes   Patient/family informed of their freedom to choose among providers that offer the needed level of care, that participate in Medicare, Medicaid or managed care program needed by the patient, have an available bed and are willing to accept the patient.  Yes   Patient/family informed of [facility]'s ownership interest in [facility] and [facility], as well as of the fact that they are under no obligation to receive care at these facilities.  PASRR submitted to EDS on       PASRR number received on       Existing PASRR number confirmed on 07/28/16     FL2 transmitted to all facilities in geographic area requested by pt/family on 07/28/16     FL2 transmitted to all facilities within larger geographic area on       Patient informed that his/her managed care company has contracts with or will negotiate with certain facilities, including the following:         YES - Patient/family informed of bed offers received.  Patient chooses bed at  [facility]  Physician recommends and patient chooses bed at      Patient to be transferred to  [facility] on  08/02/16.  Patient to be transferred to facility by  ambulance     Patient family notified on  08/02/16 of transfer.  Name of family member notified:   spouse,  [name] at the bedside     PHYSICIAN       Additional Comment:   08/02/16 - Authorization initiated for CIR but would not be authorized until Thursday morning. Patient had been faxed out and requested [place] if going to SNF. Facility contacted after 4 pm and can take patient if authorization received.  CIR request for auth. rescinded and CSW received authorization for SNF - [id number].    _______________________________________________ Sable Feil, LCSW 08/02/2016, 5:22 PM

## 2016-08-02 NOTE — Progress Notes (Signed)
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 He presented on  8/3 with one-day history of nausea, vomiting and abdominal pain Pt did have percutaneous cholecystomy drain placed 8/7.     OT comments  Patient progressing well towards OT goals. OT will continue to follow per plan of care.  Follow Up Recommendations  CIR    Equipment Recommendations  Other (comment) (tbd next venue of care)    Recommendations for Other Services      Precautions / Restrictions Precautions Precautions: Fall Precaution Comments: Biliary tube R side Restrictions Weight Bearing Restrictions: No       Mobility Bed Mobility Overal bed mobility: Needs Assistance Bed Mobility: Supine to Sit     Supine to sit: Min guard     General bed mobility comments: increased time and effort  Transfers Overall transfer level: Needs assistance Equipment used: Rolling walker (2 wheeled) Transfers: Sit to/from Stand Sit to Stand: Min assist;From elevated surface         General transfer comment: A to power up    Balance                               ADL Overall ADL's : Needs assistance/impaired     Grooming: Wash/dry hands;Wash/dry face;Min guard;Minimal assistance;Standing Grooming Details (indicate cue type and reason): pt reluctant to participate in ADLs during session; states, "I only care about walking." Education provided regarding importance of ADLs.             Lower Body Dressing: Moderate assistance;Sit to/from stand Lower Body Dressing  Details (indicate cue type and reason): donned pajama pants and slippers sit to stand from EOB.              Functional mobility during ADLs: Minimal assistance;Rolling walker;+2 for safety/equipment General ADL Comments: Needed assistance to stand from elevated bed to pull up pants. Patient reluctant to participate in ADLs this date, reporting, "I just want to be able to walk." Education provided regarding importance of ADLs and role of OT. Patient still needs reinforcement. After donning pants and slippers, pt ambulated to sink where he stood to wash and dry hands/face (needed max encouragement to do these tasks as he deemed them not important). Patient then continued ambulation with RW into hallway with chair follow. Tolerated increased distance this date but still c/o hip pain and needed to rest. Patient rolled back to room in recliner.      Vision                     Perception     Praxis      Cognition   Behavior During Therapy: [facility] for tasks assessed/performed Overall Cognitive Status: Within Functional Limits for tasks assessed                       Extremity/Trunk Assessment               Exercises    Shoulder Instructions       General Comments  Pertinent Vitals/ Pain       Pain Assessment: Faces Faces Pain Scale: Hurts little more Pain Location: R trunk at drain site; L hip pain with ambulation Pain Descriptors / Indicators: Grimacing;Guarding Pain Intervention(s): Monitored during session;Repositioned  Home Living                                          Prior Functioning/Environment              Frequency Min 2X/week     Progress Toward Goals  OT Goals(current goals can now be found in the care plan section)  Progress towards OT goals: Progressing toward goals  Acute Rehab OT Goals Patient Stated Goal: get stronger and get back home  Plan Discharge plan remains appropriate    Co-evaluation     PT/OT/SLP Co-Evaluation/Treatment: Yes Reason for Co-Treatment: Complexity of the patient's impairments (multi-system involvement);For patient/therapist safety PT goals addressed during session: Mobility/safety with mobility OT goals addressed during session: ADL's and self-care      End of Session Equipment Utilized During Treatment: Rolling walker   Activity Tolerance Patient tolerated treatment well   Patient Left in chair;with call bell/phone within reach;with family/visitor present   Nurse Communication Mobility status        Time: 1025-1051 OT Time Calculation (min): 26 min  Charges: OT General Charges $OT Visit: 1 Procedure OT Treatments $Self Care/Home Management : 8-22 mins  Debara Kamphuis A 08/02/2016, 12:16 PM

## 2016-08-02 NOTE — Discharge Instructions (Signed)
Follow with Primary MD  in 7 days   Get CBC, CMP, 2 view Chest X ray checked  by Primary MD or SNF MD in 5-7 days ( we routinely change or add medications that can affect your baseline labs and fluid status, therefore we recommend that you get the mentioned basic workup next visit with your PCP, your PCP may decide not to get them or add new tests based on their clinical decision)   Activity: As tolerated with Full fall precautions use walker/cane & assistance as needed   Disposition CIR/SNF   Diet:   Renal Low Carb.  Accuchecks 4 times/day, Once in AM empty stomach and then before each meal. Log in all results and show them to your Prim.MD in 3 days. If any glucose reading is under 80 or above 300 call your Prim MD immidiately. Follow Low glucose instructions for glucose under 80 as instructed.  For Heart failure patients - Check your Weight same time everyday, if you gain over 2 pounds, or you develop in leg swelling, experience more shortness of breath or chest pain, call your Primary MD immediately. Follow Cardiac Low Salt Diet and 1.5 lit/day fluid restriction.   On your next visit with your primary care physician please Get Medicines reviewed and adjusted.   Please request your Prim.MD to go over all Hospital Tests and Procedure/Radiological results at the follow up, please get all Hospital records sent to your Prim MD by signing hospital release before you go home.   If you experience worsening of your admission symptoms, develop shortness of breath, life threatening emergency, suicidal or homicidal thoughts you must seek medical attention immediately by calling 911 or calling your MD immediately  if symptoms less severe.  You Must read complete instructions/literature along with all the possible adverse reactions/side effects for all the Medicines you take and that have been prescribed to you. Take any new Medicines after you have completely understood and accpet all the possible  adverse reactions/side effects.   Do not drive, operate heavy machinery, perform activities at heights, swimming or participation in water activities or provide baby sitting services if your were admitted for syncope or siezures until you have seen by Primary MD or a Neurologist and advised to do so again.  Do not drive when taking Pain medications.    Do not take more than prescribed Pain, Sleep and Anxiety Medications  Special Instructions: If you have smoked or chewed Tobacco  in the last 2 yrs please stop smoking, stop any regular Alcohol  and or any Recreational drug use.  Wear Seat belts while driving.   Please note  You were cared for by a hospitalist during your hospital stay. If you have any questions about your discharge medications or the care you received while you were in the hospital after you are discharged, you can call the unit and asked to speak with the hospitalist on call if the hospitalist that took care of you is not available. Once you are discharged, your primary care physician will handle any further medical issues. Please note that NO REFILLS for any discharge medications will be authorized once you are discharged, as it is imperative that you return to your primary care physician (or establish a relationship with a primary care physician if you do not have one) for your aftercare needs so that they can reassess your need for medications and monitor your lab values.

## 2016-08-02 NOTE — Progress Notes (Signed)
[facility] Progress Note  Assessment: 1.  Acute cholecystitis, sp IR perc chole drainage 8/07 - Blood Cx:1/2 klebsiellapneumoniae. On Levaquin po x 3wk.  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 Failed trial of holding dialysis for 4-5 days. Good uop, not restricting fluids 3. 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  6.Volume/HTN -  on midodrine for low BP's. +Pedal edema.  7. Debility -Is being evaluated for inpatient rehab- he is interested in this 8.  Nutrition: Albumin 2.1 on reg diet. renal vit/protein supplement. 9.  Vascular access- LUE BVT 1st stage (placed 05/24/16), +T/B, maturing. He missed his outpatient appt with Dr. Bridgett Larsson. If he remains in hospital we may have him check on fistula and schedule second stage of BVT. - will do if he is transferred to rehab 10.  Debility - doing much better 11. Thrombocytopenia - plts were trending down but up to 111 today  Plan - HD tomorrow, try UF 1kg   [name] MD [place] pager [phone]    cell [phone] 08/02/2016, 11:05 AM    Subjective- doing much better.  Sitting up in chair this am.  Objective Vitals:   08/01/16 1532 08/01/16 2112 08/02/16 0422 08/02/16 0900  BP: 127/70 120/65 128/64 130/67  Pulse: 77 76 82 77  Resp: _0 Temp: 97.5 F (36.4 C) 97.5 F (36.4 C) 98.1 F (36.7 C) 99 F (37.2 C)  TempSrc: Oral Oral Oral Oral  SpO2: 100% 100% 96% 99%  Weight:  69.1 kg (152 lb 6.4 oz)    Height:  _1  (1.88 m)     Physical Exam General: NAD in recliner on HD Heart: RRR Lungs:  No rales Abdomen:soft tender at biliary drain site Extremities: + 1-2 ankle  edema Dialysis Access: right ij and maturing left BC AVF  Dialysis Orders: TTS SW 4h 73kg 3K/ 2.25 bath Hep 7300 R IJ cath/ maturing L BCF (05/24/16) Aranesp 40 g weekly pth 133 no vit d   Additional Objective Labs: Basic Metabolic Panel:  Recent Labs Lab 07/27/16 0650  07/30/16 0534 07/31/16 0734 08/01/16 0742  NA 134*  < > 136 135 135  K 4.1  < > 4.8 5.0 4.7  CL 98*  < > 99* 104 103  CO2 26  < > _2 GLUCOSE 276*  < > 277* 213* 194*  BUN 25*  < > 24* 38* 49*  CREATININE 2.50*  < > 2.02* 2.34* 2.45*  CALCIUM 8.0*  < > 8.0* 8.1* 8.0*  PHOS 4.2  --   --   --   --   < > = values in this interval not displayed. Liver Function Tests:  Recent Labs Lab 07/27/16 0650 08/01/16 0742 08/02/16 0506  AST  --  101* 47*  ALT  --  92* 68*  ALKPHOS  --  145* 137*  BILITOT  --  1.1 0.9  PROT  --  5.0* 4.7*  ALBUMIN 1.8* 2.1* 2.0*   No results for input(s): LIPASE, AMYLASE in the last 168 hours. CBC:  Recent Labs Lab 07/27/16 0650 07/28/16 0316 07/29/16 0831 07/31/16 0734 08/01/16 0742  WBC 7.7 6.9  8.8 7.6 8.8  NEUTROABS  --  5.0 6.1  --   --   HGB 10.6* 10.1* 10.4* 11.3* 11.6*  HCT 33.4* 32.5* 33.3* 36.5* 36.7*  MCV 91.3 92.9 92.5 93.8 92.7  PLT 121* 118* 97* 86* 111*   Blood Culture    Component Value Date/Time   SDES DRAINAGE BILE 07/17/2016 1718   SPECREQUEST Normal 07/17/2016 1718   CULT  07/17/2016 1718    MODERATE ENTEROBACTER CLOACAE MODERATE KLEBSIELLA PNEUMONIAE NO ANAEROBES ISOLATED    REPTSTATUS 07/22/2016 FINAL 07/17/2016 1718    Cardiac Enzymes: No results for input(s): CKTOTAL, CKMB, CKMBINDEX, TROPONINI in the last 168 hours. CBG:  Recent Labs Lab 07/31/16 2144 08/01/16 1159 08/01/16 1703 08/01/16 2120 08/02/16 0757  GLUCAP 207* 118* 227* 176* 184*   Iron Studies: No results for input(s): IRON, TIBC, TRANSFERRIN, FERRITIN in the last 72 hours. Lab Results  Component Value Date   INR 1.65 07/17/2016   INR 1.46  05/11/2016   INR 1.40 05/10/2016   Studies/Results: Dg Abd Portable 1v  Result Date: 08/02/2016 CLINICAL DATA:  Mid abdominal pain with nausea for 6-7 hours. EXAM: PORTABLE ABDOMEN - 1 VIEW COMPARISON:  07/17/2016. FINDINGS: Cholecystostomy tube identified in the right upper quadrant. Diffuse mild gaseous small bowel distention evident without features to suggest small bowel obstruction. No colonic dilatation. Degenerative changes noted lower lumbar spine. Dynamic hip screw identified on the left. IMPRESSION: Mild gaseous small bowel distention without evidence of obstruction. A component of mild ileus could have this appearance. Electronically Signed   By: Misty Stanley M.D.   On: 08/02/2016 09:09   Medications:   . sodium chloride   Intravenous Once  . darbepoetin (ARANESP) injection - DIALYSIS  200 mcg Intravenous Q Sat-HD  . dexamethasone  4 mg Oral Q breakfast  . famotidine  20 mg Oral Daily  . feeding supplement (ENSURE ENLIVE)  237 mL Oral BID BM  . feeding supplement (PRO-STAT SUGAR FREE 64)  30 mL Oral BID  . fluticasone  1 spray Each Nare BID  . insulin aspart  0-20 Units Subcutaneous TID WC  . lactobacillus acidophilus & bulgar  1 tablet Oral TID WC  . levofloxacin  500 mg Oral Q48H  . midodrine  10 mg Oral TID WC  . multivitamin  1 tablet Oral QHS  . protein supplement  1 scoop Oral TID WC  . vitamin B-12  1,000 mcg Oral Daily  . Vitamin D (Ergocalciferol)  50,000 Units Oral Q7 days

## 2016-08-02 NOTE — Progress Notes (Signed)
Thomas [name] to be D/C'd Nursing Home per MD order. Pt verbalized understanding. Report given to nurse at [facility]    Medication List    STOP taking these medications   diphenoxylate-atropine 2.5-0.025 MG tablet Commonly known as:  LOMOTIL   lenalidomide 2.5 MG capsule Commonly known as:  REVLIMID     TAKE these medications   acetaminophen 325 MG tablet Commonly known as:  TYLENOL Take 2 tablets (650 mg total) by mouth every 6 (six) hours as needed for mild pain (or Fever >/= 101).   acyclovir 200 MG capsule Commonly known as:  ZOVIRAX Take 1 capsule (200 mg total) by mouth 2 (two) times daily.   ALPRAZolam 0.25 MG tablet Commonly known as:  XANAX Take 1 tablet (0.25 mg total) by mouth 2 (two) times daily as needed for anxiety.   bisacodyl 10 MG suppository Commonly known as:  DULCOLAX Place 1 suppository (10 mg total) rectally daily as needed for moderate constipation.   calcium acetate 667 MG capsule Commonly known as:  PHOSLO Take 1 capsule (667 mg total) by mouth 2 (two) times daily with a meal.   cyanocobalamin 1000 MCG tablet Take 1 tablet (1,000 mcg total) by mouth daily.   dexamethasone 4 MG tablet Commonly known as:  DECADRON Take 1 tablet (4 mg total) by mouth daily. What changed:  how much to take  when to take this   insulin aspart 100 UNIT/ML injection Commonly known as:  NOVOLOG Before each meal 3 times a day, 140-199 - 2 units, 200-250 - 4 units, 251-299 - 6 units,  300-349 - 8 units,  350 or above 10 units. Dispense syringes and needles as needed, Ok to switch to PEN if approved. Substitute to any brand approved. DX DM2, Code E11.65   levofloxacin 500 MG tablet Commonly known as:  LEVAQUIN Take 1 tablet (500 mg total) by mouth every other day.   loperamide 2 MG tablet Commonly known as:  IMODIUM A-D Take 2 mg by mouth 4 (four) times daily as needed for diarrhea or loose stools.   loratadine 10 MG tablet Commonly known as:   CLARITIN Take 10 mg by mouth daily as needed for allergies.   midodrine 10 MG tablet Commonly known as:  PROAMATINE Take 1 tablet (10 mg total) by mouth 3 (three) times daily with meals.   multivitamin Tabs tablet Take 1 tablet by mouth at bedtime.   ondansetron 8 MG tablet Commonly known as:  ZOFRAN Take 1 tablet (8 mg total) by mouth every 8 (eight) hours as needed for nausea.   oxyCODONE 5 MG immediate release tablet Commonly known as:  Oxy IR/ROXICODONE Take 1 tablet (5 mg total) by mouth every 4 (four) hours as needed for moderate pain.   pantoprazole 40 MG tablet Commonly known as:  PROTONIX Take 1 tablet (40 mg total) by mouth daily.   polyethylene glycol packet Commonly known as:  MIRALAX / GLYCOLAX Take 17 g by mouth daily as needed for mild constipation or moderate constipation.   protein supplement Powd Take 6 g by mouth 3 (three) times daily with meals.   tobramycin 0.3 % ophthalmic solution Commonly known as:  TOBREX Place 1 drop into both eyes every 4 (four) hours.   triamcinolone 55 MCG/ACT Aero nasal inhaler Commonly known as:  NASACORT AQ Place 2 sprays into the nose daily.   Vitamin D (Ergocalciferol) 50000 units Caps capsule Commonly known as:  DRISDOL Take 1 capsule (50,000 Units total) by mouth  every 7 (seven) days.       Vitals:   08/02/16 0422 08/02/16 0900  BP: 128/64 130/67  Pulse: 82 77  Resp: 16 16  Temp: 98.1 F (36.7 C) 99 F (37.2 C)    Skin clean, dry and intact without evidence of skin break down, no evidence of skin tears noted. IV catheter discontinued intact. Site without signs and symptoms of complications. Dressing and pressure applied. Pt denies pain at this time. No complaints noted.  Patient D/C to [facility] via [place] RN [facility] Phone [phone]

## 2016-08-02 NOTE — Discharge Summary (Addendum)
Thomas [name] FYB:[id number] DOB: 01-16-1946 DOA: 07/13/2016  PCP: No PCP Per Patient  Admit date: 07/13/2016  Discharge date: 08/02/2016  Admitted From: Home   Disposition:  CIR/SNF   Recommendations for Outpatient Follow-up:   Follow up with PCP in 1-2 weeks  PCP Please obtain BMP/CBC, 2 view CXR in 1week,  (see Discharge instructions)   PCP Please follow up on the following pending results: None   Home Health: None   Equipment/Devices: None  Consultations:    Nephrology  PT/OT  Gen Surgery  IR  PCCM  Discharge Condition: Fair   CODE STATUS: Full   Diet Recommendation: Renal   Chief Complaint  Patient presents with  . Abdominal Pain  . abnormal labs  . Leg Pain     Brief history of present illness from the day of admission and additional interim summary    70 y.o.M with recent diagnosis of MM (end of April 2017), currently undergoing chemo and radiation. Started on new chemo drug last week (7/31) and developed significant diarrhea afterwards. Had symptoms for 3 - 4 days then on 8/3 began to have N/V and abd pain. Found to have possible acalculous cholecystitis. PCCM asked to admit to ICU due to hypotension.  Barrier to discharge her physical activity and sitting up with hemodialysis. Patient was given 2 units packed red blood cells 8/15 with dialysis. Also started on steroids. Asked CIR to evaluate patient however they feel that patient should be discharged to skilled nursing facility. Patient family adamant about patient going to inpatient rehabilitation. On recent evaluation patient is a lot stronger PT and OT recommending inpatient rehabilitation. Reconsulted inpatient rehabilitation team. 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 Continue. Has hypotension which is stable on midodrine.  Sepsis 2/2 klebsiella pneumoniae bacteremia due to acalculous cholecystitis with transaminitis. - BC 8/3 positive,  MRI/MRCP was consistent with cholecystitis, was seen by general surgery and IR and underwent gallbladder Perc drain on 07/17/16 - minimal drainage noted today.   per general surgery drain should be left in for 6-8 weeks. Transitioned to Levaquin for 3 week course stop date of 08/10/2016, clinically better Afebrile, leukocytosis has resolved. Defer management of drain to IR, currently have patient seen by general surgery at the end of this week along with IR as well.  Multiple myeloma (HCC) - Follows with Dr. Alvy Bimler, - Given anemia/thrombocytopenia, chemotherapy treatment on hold currently on Decadron, dose adjusted by Dr. Alvy Bimler on 08/01/2016. Recommend following with her within 1 week after discharge from here.  Anemia and Thrombocytopenia ([facility]) - Oncology consulted, was given Aranesp,  HIT panel negative, continue Steroids,  Received PRBC on 07/25/16 with HD,  Platelets  within range of 80s-120s, currently H&H and platelet counts are stable.  Hyponatremia - dialysis patient defer management to nephrology    Goals of Care - Family had declined PC consult  Steroid-induced hyperglycemia. Decadron dose  was reduced by Dr. Marciano Sequin on 08/01/2016, continue sliding scale. Monitor CBGs every before meals at bedtime.   Mild nausea morning of 08/02/2016. KUB shows minimal ileus, clinically none, he is passing flatness, nausea completely resolved after Zofran, placed on Maalox as needed along with Dulcolax suppository. Currently completely symptom free. Abd exam is unremarkable.   Deconditioning - PT to evaluate, CIR and LTAC consults placed will need placement.   Discharge diagnosis     Principal Problem:   Abdominal pain Active Problems:   Multiple  myeloma (HCC)   ESRD on dialysis (HCC)   Thrombocytopenia (HCC)   Anemia of renal disease   N&V (nausea and vomiting)   Sepsis (HCC)   Acute respiratory failure with hypoxia (HCC)   Elevated LFTs   Acalculous cholecystitis   Pain   Labile blood pressure   Labile blood glucose   Hyperglycemia    Discharge instructions    Discharge Instructions    AMB Referral to [facility]    Complete by:  As directed   Please assign [employer] member to [facility] RNCM for post toc. Currently at [facility]. Written consent obtained. Please contact wife, Garrell Flagg for transition of care calls at [phone]. Please contact with questions. Marthenia Rolling, [place], [facility] [phone]   Reason for consult:  Please assign to [facility]   Expected date of contact:  1-3 days (reserved for hospital discharges)   Discharge instructions    Complete by:  As directed   Follow with Primary MD  in 7 days   Get CBC, CMP, 2 view Chest X ray checked  by Primary MD or SNF MD in 5-7 days ( we routinely change or add medications that can affect your baseline labs and fluid status, therefore we recommend that you get the mentioned basic workup next visit with your PCP, your PCP may decide not to get them or add new tests based on their clinical decision)   Activity: As tolerated with Full fall precautions use walker/cane & assistance as needed   Disposition CIR/SNF   Diet:   Renal Low Carb.  Accuchecks 4 times/day, Once in AM empty stomach and then before each meal. Log in all results and show them to your Prim.MD in 3 days. If any glucose reading is under 80 or above 300 call your Prim MD immidiately. Follow Low glucose instructions for glucose under 80 as instructed.  For Heart failure patients - Check your Weight same time everyday, if you gain over 2 pounds, or you develop in leg swelling, experience more shortness of breath or chest pain, call your Primary MD  immediately. Follow Cardiac Low Salt Diet and 1.5 lit/day fluid restriction.   On your next visit with your primary care physician please Get Medicines reviewed and adjusted.   Please request your Prim.MD to go over all Hospital Tests and Procedure/Radiological results at the follow up, please get all Hospital records sent to your Prim MD by signing hospital release before you go home.   If you experience worsening of your admission symptoms, develop shortness of breath, life threatening emergency, suicidal or homicidal thoughts you must seek medical attention immediately by calling 911 or calling your MD immediately  if symptoms less severe.  You Must read complete instructions/literature along with all the possible adverse reactions/side effects for all the Medicines you take and that have been prescribed to you. Take any new Medicines after you have completely understood and accpet all the possible  adverse reactions/side effects.   Do not drive, operate heavy machinery, perform activities at heights, swimming or participation in water activities or provide baby sitting services if your were admitted for syncope or siezures until you have seen by Primary MD or a Neurologist and advised to do so again.  Do not drive when taking Pain medications.    Do not take more than prescribed Pain, Sleep and Anxiety Medications  Special Instructions: If you have smoked or chewed Tobacco  in the last 2 yrs please stop smoking, stop any regular Alcohol  and or any Recreational drug use.  Wear Seat belts while driving.   Please note  You were cared for by a hospitalist during your hospital stay. If you have any questions about your discharge medications or the care you received while you were in the hospital after you are discharged, you can call the unit and asked to speak with the hospitalist on call if the hospitalist that took care of you is not available. Once you are discharged, your primary care  physician will handle any further medical issues. Please note that NO REFILLS for any discharge medications will be authorized once you are discharged, as it is imperative that you return to your primary care physician (or establish a relationship with a primary care physician if you do not have one) for your aftercare needs so that they can reassess your need for medications and monitor your lab values.   Increase activity slowly    Complete by:  As directed      Discharge Medications     Medication List    STOP taking these medications   diphenoxylate-atropine 2.5-0.025 MG tablet Commonly known as:  LOMOTIL   lenalidomide 2.5 MG capsule Commonly known as:  REVLIMID     TAKE these medications   acetaminophen 325 MG tablet Commonly known as:  TYLENOL Take 2 tablets (650 mg total) by mouth every 6 (six) hours as needed for mild pain (or Fever >/= 101).   acyclovir 200 MG capsule Commonly known as:  ZOVIRAX Take 1 capsule (200 mg total) by mouth 2 (two) times daily.   ALPRAZolam 0.25 MG tablet Commonly known as:  XANAX Take 1 tablet (0.25 mg total) by mouth 2 (two) times daily as needed for anxiety.   bisacodyl 10 MG suppository Commonly known as:  DULCOLAX Place 1 suppository (10 mg total) rectally daily as needed for moderate constipation.   calcium acetate 667 MG capsule Commonly known as:  PHOSLO Take 1 capsule (667 mg total) by mouth 2 (two) times daily with a meal.   cyanocobalamin 1000 MCG tablet Take 1 tablet (1,000 mcg total) by mouth daily.   dexamethasone 4 MG tablet Commonly known as:  DECADRON Take 1 tablet (4 mg total) by mouth daily. What changed:  how much to take  when to take this   insulin aspart 100 UNIT/ML injection Commonly known as:  NOVOLOG Before each meal 3 times a day, 140-199 - 2 units, 200-250 - 4 units, 251-299 - 6 units,  300-349 - 8 units,  350 or above 10 units. Dispense syringes and needles as needed, Ok to switch to PEN if  approved. Substitute to any brand approved. DX DM2, Code E11.65   levofloxacin 500 MG tablet Commonly known as:  LEVAQUIN Take 1 tablet (500 mg total) by mouth every other day.   loperamide 2 MG tablet Commonly known as:  IMODIUM A-D Take 2 mg by mouth 4 (four) times daily as  needed for diarrhea or loose stools.   loratadine 10 MG tablet Commonly known as:  CLARITIN Take 10 mg by mouth daily as needed for allergies.   midodrine 10 MG tablet Commonly known as:  PROAMATINE Take 1 tablet (10 mg total) by mouth 3 (three) times daily with meals.   multivitamin Tabs tablet Take 1 tablet by mouth at bedtime.   ondansetron 8 MG tablet Commonly known as:  ZOFRAN Take 1 tablet (8 mg total) by mouth every 8 (eight) hours as needed for nausea.   oxyCODONE 5 MG immediate release tablet Commonly known as:  Oxy IR/ROXICODONE Take 1 tablet (5 mg total) by mouth every 4 (four) hours as needed for moderate pain.   pantoprazole 40 MG tablet Commonly known as:  PROTONIX Take 1 tablet (40 mg total) by mouth daily.   polyethylene glycol packet Commonly known as:  MIRALAX / GLYCOLAX Take 17 g by mouth daily as needed for mild constipation or moderate constipation.   protein supplement Powd Take 6 g by mouth 3 (three) times daily with meals.   tobramycin 0.3 % ophthalmic solution Commonly known as:  TOBREX Place 1 drop into both eyes every 4 (four) hours.   triamcinolone 55 MCG/ACT Aero nasal inhaler Commonly known as:  NASACORT AQ Place 2 sprays into the nose daily.   Vitamin D (Ergocalciferol) 50000 units Caps capsule Commonly known as:  DRISDOL Take 1 capsule (50,000 Units total) by mouth every 7 (seven) days.       Follow-up Information    HASSELL III, DAYNE DANIEL, MD Follow up in 6 week(s).   Specialty:  Interventional Radiology Why:  pt to hear from scheduler for appt time and date for 6 week follow up Contact information: [place]  [id number] [phone]        WILSON,ERIC M, MD. Go on 08/10/2016.   Specialty:  General Surgery Why:  Your appointment is at 10:30 AM to follow-up on your gallbladder drain, please arrive by 10:00 AM to get checked in and fill out any necessary paperwork.  Contact information: [address] [place] [address] [phone]        [facility], NI, MD. Schedule an appointment as soon as possible for a visit in 1 week(s).   Specialty:  Hematology and Oncology Contact information: [place] [address] [phone]           Major procedures and Radiology Reports - PLEASE review detailed and final reports thoroughly  -      GB Perc Drain in place, 07/17/16   Nm Hepatobiliary Liver Func  Result Date: 07/14/2016 CLINICAL DATA:  Right upper quadrant pain. Abnormal sonographic appearance of the gallbladder. EXAM: NUCLEAR MEDICINE HEPATOBILIARY IMAGING TECHNIQUE: Sequential images of the abdomen were obtained out to 120 minutes following intravenous administration of radiopharmaceutical. After IV administered of 3 mg morphine, imaging obtained for an additional 30 minutes. RADIOPHARMACEUTICALS:  5.2  mCi Tc-72m Choletec IV COMPARISON:  Right upper quadrant ultrasound yesterday. FINDINGS: Imaging obtained for 2 hours and 30 minutes. There is mildly delayed hepatic uptake. There is no visualized tracer accumulation within the intrahepatic ducts. At 35 minutes tracer is visualized in the midline, which may be duodenum or common bile duct. There is midline tracer increases in intensity with to and fro trace accumulation proximal and distally likely refluxed into the stomach and passing distally proximal small bowel. After 120 minutes, no gallbladder activity. As small bowel  activity was visualized, 3 mg of morphine was administered. Increased GI tract activity, no gallbladder visualization after morphine. IMPRESSION: Gallbladder not visualized after 2.5 hours as well as  the administration of morphine. In addition however the intra and extrahepatic bile ducts are not well seen. Nonvisualization of the gallbladder may be secondary to cholecystitis versus underlying hepatocellular dysfunction (patient bilirubin greater than 3). Limited visualization of the biliary tree activity is likely secondary to hepatic cellular dysfunction. Biliary obstruction is felt much less likely as GI tract activity is observed. Electronically Signed   By: Jeb Levering M.D.   On: 07/14/2016 02:59   Mr Abdomen Mrcp Wo Cm  Result Date: 07/17/2016 CLINICAL DATA:  Right upper quadrant pain. Elevated liver function tests. History of multiple myeloma. EXAM: MRI ABDOMEN WITHOUT CONTRAST  (INCLUDING MRCP) TECHNIQUE: Multiplanar multisequence MR imaging of the abdomen was performed. Heavily T2-weighted images of the biliary and pancreatic ducts were obtained, and three-dimensional MRCP images were rendered by post processing. COMPARISON:  Ultrasound or nuclear medicine hepatobiliary study of 07/13/2016. FINDINGS: Mild to moderate motion degradation throughout. Lower chest: Left larger than right pleural effusions with bibasilar airspace disease. Normal heart size without pericardial or pleural effusion. Hepatobiliary: No focal liver lesion. No intrahepatic biliary duct dilatation. Gallbladder sludge with mild gallbladder distension. Gallbladder wall thickening including at 10 mm on image 27/series 4. Mild pericholecystic fluid, including on image 25/series 5. No evidence of common duct stone. Pancreas: Mild pancreatic atrophy, without duct dilatation or mass. Limited evaluation for acute pancreatitis, secondary to relatively diffuse abdominal edema. Spleen: Mild splenomegaly, 14.0 cm craniocaudal. Adrenals/Urinary Tract: Normal adrenal glands. Bilateral renal lesions which are likely cysts. No hydronephrosis. Stomach/Bowel: Normal stomach and abdominal small bowel loops. There is wall thickening of and edema  within both the ascending and descending colon, including on images 21/series 3 and 44/series 5. Vascular/Lymphatic: Normal aortic caliber. No retroperitoneal or retrocrural adenopathy. Other: Small volume ascites.  Diffuse anasarca. Musculoskeletal: Heterogeneous marrow signal with is foci of T2 hyperintensity within the lower lumbar and lower thoracic spine. IMPRESSION: 1. Motion degraded exam. 2. No biliary duct dilatation or choledocholithiasis. 3. Gallbladder distension with wall thickening and pericholecystic fluid. Concurrent gallbladder sludge. Findings are suspicious for acute cholecystitis. 4. Bilateral pleural effusions and ascites, suggesting fluid overload. 5. Heterogeneous marrow signal with foci of T2 hyperintensity within. Likely related to the clinical history of multiple myeloma. 6. Multi focal colitis.  Distribution favors infection. Electronically Signed   By: Abigail Miyamoto M.D.   On: 07/17/2016 07:55   Mr 3d Recon At Scanner  Result Date: 07/17/2016 CLINICAL DATA:  Right upper quadrant pain. Elevated liver function tests. History of multiple myeloma. EXAM: MRI ABDOMEN WITHOUT CONTRAST  (INCLUDING MRCP) TECHNIQUE: Multiplanar multisequence MR imaging of the abdomen was performed. Heavily T2-weighted images of the biliary and pancreatic ducts were obtained, and three-dimensional MRCP images were rendered by post processing. COMPARISON:  Ultrasound or nuclear medicine hepatobiliary study of 07/13/2016. FINDINGS: Mild to moderate motion degradation throughout. Lower chest: Left larger than right pleural effusions with bibasilar airspace disease. Normal heart size without pericardial or pleural effusion. Hepatobiliary: No focal liver lesion. No intrahepatic biliary duct dilatation. Gallbladder sludge with mild gallbladder distension. Gallbladder wall thickening including at 10 mm on image 27/series 4. Mild pericholecystic fluid, including on image 25/series 5. No evidence of common duct stone.  Pancreas: Mild pancreatic atrophy, without duct dilatation or mass. Limited evaluation for acute pancreatitis, secondary to relatively diffuse abdominal edema. Spleen: Mild splenomegaly, 14.0 cm  craniocaudal. Adrenals/Urinary Tract: Normal adrenal glands. Bilateral renal lesions which are likely cysts. No hydronephrosis. Stomach/Bowel: Normal stomach and abdominal small bowel loops. There is wall thickening of and edema within both the ascending and descending colon, including on images 21/series 3 and 44/series 5. Vascular/Lymphatic: Normal aortic caliber. No retroperitoneal or retrocrural adenopathy. Other: Small volume ascites.  Diffuse anasarca. Musculoskeletal: Heterogeneous marrow signal with is foci of T2 hyperintensity within the lower lumbar and lower thoracic spine. IMPRESSION: 1. Motion degraded exam. 2. No biliary duct dilatation or choledocholithiasis. 3. Gallbladder distension with wall thickening and pericholecystic fluid. Concurrent gallbladder sludge. Findings are suspicious for acute cholecystitis. 4. Bilateral pleural effusions and ascites, suggesting fluid overload. 5. Heterogeneous marrow signal with foci of T2 hyperintensity within. Likely related to the clinical history of multiple myeloma. 6. Multi focal colitis.  Distribution favors infection. Electronically Signed   By: Abigail Miyamoto M.D.   On: 07/17/2016 07:55   Ir Perc Cholecystostomy  Result Date: 07/17/2016 CLINICAL DATA:  Ultrasound, MRCP, and hepatobiliary scintigraphy suggest acute cholecystitis. Thrombocytopenia. EXAM: PERCUTANEOUS CHOLECYSTOSTOMY TUBE PLACEMENT WITH ULTRASOUND AND FLUOROSCOPIC GUIDANCE FLUOROSCOPY TIME:  36 seconds, 6 mGy TECHNIQUE: The procedure, risks (including but not limited to bleeding, infection, organ damage ), benefits, and alternatives were explained to the patient. Questions regarding the procedure were encouraged and answered. The patient understands and consents to the procedure. Survey ultrasound of  the abdomen was performed and an appropriate skin entry site was identified. Skin site was marked, prepped with Betadine, and draped in usual sterile fashion, and infiltrated locally with 1% lidocaine.Intravenous Fentanyl and Versed were administered as conscious sedation during continuous monitoring of the patient's level of consciousness and physiological / cardiorespiratory status by AV the radiology RN, with a total moderate sedation time of 10 minutes. Under real-time ultrasound guidance, gallbladder was accessed using a transhepatic approach with a 21-gauge needle. Ultrasound image documentation was saved. Bile returned through the hub. Needle was exchanged over a 018 guidewire for transitional dilator which allowed placement of 035 J wire. Over this, a 10.2 French pigtail catheter was advanced and formed centrally in the gallbladder lumen. Small contrast injection confirmed appropriate position. Catheter secured externally with 0 Prolene suture and placed external drain bag. Patient tolerated the procedure well. COMPLICATIONS: COMPLICATIONS none IMPRESSION: 1. Technically successful percutaneous cholecystostomy tube placement with ultrasound and fluoroscopic guidance. Electronically Signed   By: Lucrezia Europe M.D.   On: 07/17/2016 17:15   Dg Chest Port 1 View  Result Date: 07/13/2016 CLINICAL DATA:  Effusion. Abnormal labs.Hx of CKD, dialysis patient.Ex-smoker, quit ~30 years ago. EXAM: PORTABLE CHEST 1 VIEW COMPARISON:  05/24/2016 FINDINGS: Cardiac silhouette is normal in size. No mediastinal or hilar masses or evidence of adenopathy. There is vascular congestion. Left greater than right pleural effusions are noted. There is some hazy opacity in the lung bases, likely due to atelectasis. No pneumothorax. Right internal jugular dual-lumen central venous catheter is stable and well positioned. IMPRESSION: 1. Small, left greater than right, pleural effusions with associated lung base opacity, most likely  atelectasis. 2. Central vascular congestion without overt pulmonary edema. Electronically Signed   By: Lajean Manes M.D.   On: 07/13/2016 17:27   Dg Abd Portable 1v  Result Date: 08/02/2016 CLINICAL DATA:  Mid abdominal pain with nausea for 6-7 hours. EXAM: PORTABLE ABDOMEN - 1 VIEW COMPARISON:  07/17/2016. FINDINGS: Cholecystostomy tube identified in the right upper quadrant. Diffuse mild gaseous small bowel distention evident without features to suggest small bowel obstruction. No colonic  dilatation. Degenerative changes noted lower lumbar spine. Dynamic hip screw identified on the left. IMPRESSION: Mild gaseous small bowel distention without evidence of obstruction. A component of mild ileus could have this appearance. Electronically Signed   By: Misty Stanley M.D.   On: 08/02/2016 09:09   US Abdomen Limited Ruq  Result Date: 07/13/2016 CLINICAL DATA:  Right upper quadrant pain for 2 days. Nausea and vomiting for 1 day. EXAM: US ABDOMEN LIMITED - RIGHT UPPER QUADRANT COMPARISON:  None. FINDINGS: Gallbladder: The gallbladder is mildly distended measuring 5.2 cm in transverse dimension. This also mild gallbladder wall thickening and gallbladder sludge. No definite gallstones or pericholecystic fluid. Negative sonographic Murphy sign. Common bile duct: Diameter: 4.1 mm Liver: Normal echogenicity without focal lesion or biliary dilatation. Small amount of right upper quadrant ascites. Right pleural effusion noted. IMPRESSION: 1. Distended gallbladder with mild gallbladder wall thickening and gallbladder sludge. Could not exclude acalculous cholecystitis. 2. Normal caliber common bile duct. 3. Normal liver. 4. Small amount of right upper quadrant ascites and right pleural effusion. Electronically Signed   By: Marijo Sanes M.D.   On: 07/13/2016 12:05    Micro Results     Recent Results (from the past 240 hour(s))  C difficile quick scan w PCR reflex     Status: None   Collection Time: 07/27/16  5:56 PM   Result Value Ref Range Status   C Diff antigen NEGATIVE NEGATIVE Final   C Diff toxin NEGATIVE NEGATIVE Final   C Diff interpretation No C. difficile detected.  Final    Today   Subjective    Fausto Sampedro today has no headache,no chest abdominal pain,no new weakness tingling or numbness, feels much better     Objective   Blood pressure 130/67, pulse 77, temperature 99 F (37.2 C), temperature source Oral, resp. rate 16, height _0  (1.88 m), weight 69.1 kg (152 lb 6.4 oz), SpO2 99 %.   Intake/Output Summary (Last 24 hours) at 08/02/16 1142 Last data filed at 08/02/16 1034  Gross per 24 hour  Intake              500 ml  Output             1267 ml  Net             -767 ml    Exam Awake Alert, Oriented x 3, No new F.N deficits, Normal affect [place].AT,PERRAL Supple Neck,No JVD, No cervical lymphadenopathy appriciated.  Symmetrical Chest wall movement, Good air movement bilaterally, CTAB RRR,No Gallops,Rubs or new Murmurs, No Parasternal Heave +ve B.Sounds, Abd Soft, Non tender, No organomegaly appriciated, No rebound -guarding or rigidity. GB drain in place No Cyanosis, Clubbing or edema, No new Rash or bruise   Data Review   CBC w Diff:  Lab Results  Component Value Date   WBC 8.8 08/01/2016   HGB 11.6 (L) 08/01/2016   HGB 8.6 (L) 07/13/2016   HCT 36.7 (L) 08/01/2016   HCT 26.6 (L) 07/13/2016   PLT 111 (L) 08/01/2016   PLT 86 (L) 07/13/2016   LYMPHOPCT 25 07/29/2016   LYMPHOPCT 1.5 (L) 07/13/2016   MONOPCT 6 07/29/2016   MONOPCT 5.5 07/13/2016   EOSPCT 0 07/29/2016   EOSPCT 0.0 07/13/2016   BASOPCT 0 07/29/2016   BASOPCT 0.2 07/13/2016    CMP:  Lab Results  Component Value Date   NA 135 08/01/2016   NA 134 (L) 07/13/2016   K 4.7 08/01/2016   K 4.2 07/13/2016  CL 103 08/01/2016   CO2 24 08/01/2016   CO2 23 07/13/2016   BUN 49 (H) 08/01/2016   BUN 28.6 (H) 07/13/2016   CREATININE 2.45 (H) 08/01/2016   CREATININE 3.3 (HH) 07/13/2016   PROT 4.7 (L)  08/02/2016   PROT 6.4 07/13/2016   ALBUMIN 2.0 (L) 08/02/2016   ALBUMIN 2.3 (L) 07/13/2016   BILITOT 0.9 08/02/2016   BILITOT 3.43 (H) 07/13/2016   ALKPHOS 137 (H) 08/02/2016   ALKPHOS 251 (H) 07/13/2016   AST 47 (H) 08/02/2016   AST 67 (H) 07/13/2016   ALT 68 (H) 08/02/2016   ALT 74 (H) 07/13/2016  .   Total Time in preparing paper work, data evaluation and todays exam - 35 minutes  Thurnell Lose M.D on 08/02/2016 at 11:42 AM  [facility]   Office  [phone]

## 2016-08-02 NOTE — Care Management Note (Signed)
Case Management Note  Patient Details  Name: Abinav Clendenen MRN: [id number] Date of Birth: 07-Aug-1946  Subjective/Objective:         CM following for progression and d/c planning.            Action/Plan: 08/02/2016 Pt for d/c to SNF, today will d/c to [facility].   Expected Discharge Date:     08/02/2016             Expected Discharge Plan:  [place]  In-House Referral:  NA  Discharge planning Services  CM Consult  Post Acute Care Choice:   NA Choice offered to:  NA  DME Arranged:  N/A DME Agency:  NA  HH Arranged:  NA HH Agency:  NA  Status of Service:  Completed, signed off  If discussed at [place] of Stay Meetings, dates discussed:    Additional Comments:  Adron Bene, RN 08/02/2016, 4:01 PM

## 2016-08-02 NOTE — Progress Notes (Signed)
Thomas [name]   DOB:03/10/1946   LE#:[id number]    Subjective: He is seen in his room. Multiple family members are present. He is doing better. Energy level is improving. He has regained appetite since I started him on dexamethasone. He has participated with physical therapy today  Objective:  Vitals:   08/02/16 0422 08/02/16 0900  BP: 128/64 130/67  Pulse: 82 77  Resp: 16 16  Temp: 98.1 F (36.7 C) 99 F (37.2 C)     Intake/Output Summary (Last 24 hours) at 08/02/16 1647 Last data filed at 08/02/16 1342  Gross per 24 hour  Intake              500 ml  Output             1265 ml  Net             -765 ml    GENERAL:alert, no distress and comfortable SKIN: skin color, texture, turgor are normal, no rashes or significant lesions EYES: normal, Conjunctiva are pink and non-injected, sclera clear Musculoskeletal:no cyanosis of digits and no clubbing  NEURO: alert & oriented x 3 with fluent speech, no focal motor/sensory deficits   Labs:  Lab Results  Component Value Date   WBC 8.8 08/01/2016   HGB 11.6 (L) 08/01/2016   HCT 36.7 (L) 08/01/2016   MCV 92.7 08/01/2016   PLT 111 (L) 08/01/2016   NEUTROABS 6.1 07/29/2016    Lab Results  Component Value Date   NA 135 08/01/2016   K 4.7 08/01/2016   CL 103 08/01/2016   CO2 24 08/01/2016    Studies:  Dg Abd Portable 1v  Result Date: 08/02/2016 CLINICAL DATA:  Mid abdominal pain with nausea for 6-7 hours. EXAM: PORTABLE ABDOMEN - 1 VIEW COMPARISON:  07/17/2016. FINDINGS: Cholecystostomy tube identified in the right upper quadrant. Diffuse mild gaseous small bowel distention evident without features to suggest small bowel obstruction. No colonic dilatation. Degenerative changes noted lower lumbar spine. Dynamic hip screw identified on the left. IMPRESSION: Mild gaseous small bowel distention without evidence of obstruction. A component of mild ileus could have this appearance. Electronically Signed   By: Misty Stanley M.D.   On:  08/02/2016 09:09    Assessment & Plan:  Multiple myeloma not having achieved remission ([place]) I review his recent test results with him and family. Serum light chain has dropped significantly to near normal ratio Due to recent pancytopenia, treatment is placed on hold Continue aggressive supportive care Of note, his recent chemotherapy is not the cause of the acute cholecystitis Will resume treatment in the clinic in 2 weeks I recommend he continues dexamethasone 4 mg daily until I see him back  Antineoplastic chemotherapy induced pancytopenia (HCC) Pancytopenia has improved since discontinuation of Revlimid.  The patient denies any recent signs or symptoms of bleeding such as spontaneous epistaxis, hematuria or hematochezia. The anemia is related to chronic renal failure. He is getting ESA through dialysis center He has significant improvement in energy since he received 2 units of blood on 07/25/2016 He does not need platelet transfusion unless is less than 10.  N&V (nausea and vomiting), resolved He had received IV fluids and antiemetics  Abdominal pain, resolved Leukocytosis with Elevated LFT, improving He is currently on broad-spectrum IV antibiotics MRCP suggested possible acute cholecystitis He has IR placement of drainage to on 94/49/6759 without complications.  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 I will continue on 39m daily dexamethasone   Severe deconditioning I am concerned about safety at home He will continue PT will hospitalized Hopefully he will be DC to rehab facility  Discharge planning Likely soon I plan to see him  back in my office on 9/11  Will sign off. Call if questions arise [facility] Tallin Hart, MD 08/02/2016  4:47 PM

## 2016-08-03 ENCOUNTER — Non-Acute Institutional Stay (SKILLED_NURSING_FACILITY): Payer: PPO | Admitting: Internal Medicine

## 2016-08-03 ENCOUNTER — Other Ambulatory Visit: Payer: Self-pay | Admitting: *Deleted

## 2016-08-03 ENCOUNTER — Other Ambulatory Visit: Payer: Self-pay | Admitting: General Surgery

## 2016-08-03 ENCOUNTER — Encounter: Payer: Self-pay | Admitting: Internal Medicine

## 2016-08-03 ENCOUNTER — Ambulatory Visit: Payer: PPO

## 2016-08-03 DIAGNOSIS — D696 Thrombocytopenia, unspecified: Secondary | ICD-10-CM

## 2016-08-03 DIAGNOSIS — C9000 Multiple myeloma not having achieved remission: Secondary | ICD-10-CM | POA: Diagnosis not present

## 2016-08-03 DIAGNOSIS — J9601 Acute respiratory failure with hypoxia: Secondary | ICD-10-CM

## 2016-08-03 DIAGNOSIS — R7401 Elevation of levels of liver transaminase levels: Secondary | ICD-10-CM

## 2016-08-03 DIAGNOSIS — Z992 Dependence on renal dialysis: Principal | ICD-10-CM

## 2016-08-03 DIAGNOSIS — K219 Gastro-esophageal reflux disease without esophagitis: Secondary | ICD-10-CM

## 2016-08-03 DIAGNOSIS — R739 Hyperglycemia, unspecified: Secondary | ICD-10-CM

## 2016-08-03 DIAGNOSIS — D61810 Antineoplastic chemotherapy induced pancytopenia: Secondary | ICD-10-CM

## 2016-08-03 DIAGNOSIS — I951 Orthostatic hypotension: Secondary | ICD-10-CM | POA: Diagnosis not present

## 2016-08-03 DIAGNOSIS — R740 Nonspecific elevation of levels of transaminase and lactic acid dehydrogenase [LDH]: Secondary | ICD-10-CM | POA: Diagnosis not present

## 2016-08-03 DIAGNOSIS — K819 Cholecystitis, unspecified: Secondary | ICD-10-CM

## 2016-08-03 DIAGNOSIS — N186 End stage renal disease: Secondary | ICD-10-CM | POA: Diagnosis not present

## 2016-08-03 DIAGNOSIS — A414 Sepsis due to anaerobes: Secondary | ICD-10-CM

## 2016-08-03 DIAGNOSIS — T380X5A Adverse effect of glucocorticoids and synthetic analogues, initial encounter: Secondary | ICD-10-CM

## 2016-08-03 DIAGNOSIS — E871 Hypo-osmolality and hyponatremia: Secondary | ICD-10-CM

## 2016-08-03 DIAGNOSIS — T451X5A Adverse effect of antineoplastic and immunosuppressive drugs, initial encounter: Secondary | ICD-10-CM

## 2016-08-03 DIAGNOSIS — E559 Vitamin D deficiency, unspecified: Secondary | ICD-10-CM

## 2016-08-03 DIAGNOSIS — E538 Deficiency of other specified B group vitamins: Secondary | ICD-10-CM

## 2016-08-03 NOTE — Progress Notes (Signed)
MRN: [id number] Name: Thomas [name]  Sex: male Age: 70 y.o. DOB: [birth year]  [place] #:  Facility/Room: [facility] / [facility] Level Of Care: SNF Provider: Noah Delaine. Sheppard Coil, MD Emergency Contacts: Extended Emergency Contact Information Primary Emergency Contact: Hersman,Hilda Address: [address] [facility] [place]          [place], [place] [address] Johnnette Litter of [place] Phone: [phone] Mobile Phone: [phone] Relation: Spouse Secondary Emergency Contact: Sherrine Maples [place] of [place] Mobile Phone: [phone] Relation: Daughter  Code Status: Full Code  Allergies: Penicillins; Dilaudid [hydromorphone hcl]; Fish allergy; and Codeine  Chief Complaint  Patient presents with  . New Admit To SNF    Admit to Facility    HPI: Patient is 70 y.o. male wIth ESRD on dialysis and  recent diagnosis of MM (end of April 2017), who was  undergoing chemo and radiation and who started on new chemo 7/31 and developed  diarrhea afterwards for 3-4 days, felt 2/2 to chemo. On 8/3 pt developed abd pain and vomiting and presented to ED. Pt was admitted to [facility] from 8/3-23 where he was admitted to ICU for hypotension and dx with acalculus cholecystitis and sepsis 2/2 with klebsiella pneumonia.He was treated with IV antibiotics and a biliary drain. Hospital course was complicated by acute respiratory failure, anemia and thrombocytopenia, from chemo , hyponatremia and steroid induced hyperglycemia. Pt is now on po antibiotics and is admitted to SNF for generalized weakness for OT/PT. While at SNF pt will be followed for GERD, tx with protonix, Vitamin b12 def , tx with replacement and Vit D def, tx with replacement weekly.   Past Medical History:  Diagnosis Date  . Anemia    ANEMIA OF RENAL DISEASE  . Cancer ([place])   . Chronic kidney disease   . Compression fracture   . Elevated LFTs 07/2016  . Thrombocytopenia ([place]) 07/2016    Past Surgical History:  Procedure Laterality Date  . AV FISTULA  PLACEMENT Left 05/24/2016   Procedure: ARTERIOVENOUS (AV) FISTULA CREATION-LEFT;  Surgeon: Conrad Wallace, MD;  Location: [place];  Service: Vascular;  Laterality: Left;  . BONE BIOPSY Left 05/14/2016   Procedure: LEFT FEMORAL BIOPSY WITH INTRAOPERATIVE FROZEN SECTIONS ;  Surgeon: Rod Can, MD;  Location: [place];  Service: Orthopedics;  Laterality: Left;  . FEMUR IM NAIL Left 05/14/2016   Procedure: INTRAMEDULLARY (IM) NAIL FEMORAL;  Surgeon: Rod Can, MD;  Location: [place];  Service: Orthopedics;  Laterality: Left;  . IR GENERIC HISTORICAL  07/17/2016   IR PERC CHOLECYSTOSTOMY 07/17/2016 Arne Cleveland, MD [facility]-INTERV RAD  . MOUTH SURGERY    . SP CHOLECYSTOMY  07/2016   cholecystostomy drain placed under [place] and fluoro      Medication List       Accurate as of 08/03/16 11:59 PM. Always use your most recent med list.          acetaminophen 325 MG tablet Commonly known as:  TYLENOL Take 2 tablets (650 mg total) by mouth every 6 (six) hours as needed for mild pain (or Fever >/= 101).   acyclovir 200 MG capsule Commonly known as:  ZOVIRAX Take 1 capsule (200 mg total) by mouth 2 (two) times daily.   ALPRAZolam 0.25 MG tablet Commonly known as:  XANAX Take 1 tablet (0.25 mg total) by mouth 2 (two) times daily as needed for anxiety.   bisacodyl 10 MG suppository Commonly known as:  DULCOLAX Place 1 suppository (10 mg total) rectally daily as needed for moderate  constipation.   calcium acetate 667 MG capsule Commonly known as:  PHOSLO Take 1 capsule (667 mg total) by mouth 2 (two) times daily with a meal.   cyanocobalamin 1000 MCG tablet Take 1 tablet (1,000 mcg total) by mouth daily.   dexamethasone 4 MG tablet Commonly known as:  DECADRON Take 1 tablet (4 mg total) by mouth daily.   insulin aspart 100 UNIT/ML injection Commonly known as:  NOVOLOG Before each meal 3 times a day, 140-199 - 2 units, 200-250 - 4 units, 251-299 - 6 units,  300-349 - 8 units,  350 or above 10 units.  Dispense syringes and needles as needed, Ok to switch to PEN if approved. Substitute to any brand approved. DX DM2, Code E11.65   levofloxacin 500 MG tablet Commonly known as:  LEVAQUIN Take 1 tablet (500 mg total) by mouth every other day.   loperamide 2 MG tablet Commonly known as:  IMODIUM A-D Take 2 mg by mouth 4 (four) times daily as needed for diarrhea or loose stools.   loratadine 10 MG tablet Commonly known as:  CLARITIN Take 10 mg by mouth daily as needed for allergies.   midodrine 10 MG tablet Commonly known as:  PROAMATINE Take 1 tablet (10 mg total) by mouth 3 (three) times daily with meals.   multivitamin Tabs tablet Take 1 tablet by mouth at bedtime.   ondansetron 8 MG tablet Commonly known as:  ZOFRAN Take 1 tablet (8 mg total) by mouth every 8 (eight) hours as needed for nausea.   oxyCODONE 5 MG immediate release tablet Commonly known as:  Oxy IR/ROXICODONE Take 1 tablet (5 mg total) by mouth every 4 (four) hours as needed for moderate pain.   pantoprazole 40 MG tablet Commonly known as:  PROTONIX Take 1 tablet (40 mg total) by mouth daily.   polyethylene glycol packet Commonly known as:  MIRALAX / GLYCOLAX Take 17 g by mouth daily as needed for mild constipation or moderate constipation.   protein supplement Powd Take 6 g by mouth 3 (three) times daily with meals.   tobramycin 0.3 % ophthalmic solution Commonly known as:  TOBREX Place 1 drop into both eyes every 4 (four) hours.   triamcinolone 55 MCG/ACT Aero nasal inhaler Commonly known as:  NASACORT AQ Place 2 sprays into the nose daily.   Vitamin D (Ergocalciferol) 50000 units Caps capsule Commonly known as:  DRISDOL Take 1 capsule (50,000 Units total) by mouth every 7 (seven) days.       No orders of the defined types were placed in this encounter.   Immunization History  Administered Date(s) Administered  . PPD Test 08/02/2016    Social History  Substance Use Topics  . Smoking  status: Former Research scientist (life sciences)  . Smokeless tobacco: Never Used  . Alcohol use No    Family history is   Family History  Problem Relation Age of Onset  . Diabetes Father       Review of Systems  DATA OBTAINED: from patient, nurse, wife GENERAL:  no fevers, fatigue, appetite changes SKIN: No itching, rash or wounds EYES: No eye pain, redness, discharge EARS: No earache, tinnitus, change in hearing NOSE: No congestion, drainage or bleeding  MOUTH/THROAT: No mouth or tooth pain, No sore throat RESPIRATORY: No cough, wheezing, SOB CARDIAC: No chest pain, palpitations, lower extremity edema  GI: No abdominal pain, No N/V/D or constipation, No heartburn or reflux  GU: No dysuria, frequency or urgency, or incontinence  MUSCULOSKELETAL: No unrelieved bone/joint  pain NEUROLOGIC: No headache, dizziness or focal weakness PSYCHIATRIC: No c/o anxiety or sadness   Vitals:   08/03/16 0853  BP: (!) 157/70  Pulse: 76  Resp: 18  Temp: 98.3 F (36.8 C)    SpO2 Readings from Last 1 Encounters:  08/03/16 98%        Physical Exam  GENERAL APPEARANCE: Alert, conversant,  No acute distress.  SKIN: No diaphoresis rash HEAD: Normocephalic, atraumatic  EYES: Conjunctiva/lids clear. Pupils round, reactive. EOMs intact.  EARS: External exam WNL, canals clear. Hearing grossly normal.  NOSE: No deformity or discharge.  MOUTH/THROAT: Lips w/o lesions  RESPIRATORY: Breathing is even, unlabored. Lung sounds are clear   CARDIOVASCULAR: Heart RRR no murmurs, rubs or gallops. No peripheral edema.   GASTROINTESTINAL: Abdomen is soft, non-tender, not distended w/ normal bowel sounds; GB draon draining clear brown fluid GENITOURINARY: Bladder non tender, not distended  MUSCULOSKELETAL: No abnormal joints or musculature NEUROLOGIC:  Cranial nerves 2-12 grossly intact. Moves all extremities  PSYCHIATRIC: Mood and affect appropriate to situation, no behavioral issues  Patient Active Problem List    Diagnosis Date Noted  . Pain   . Labile blood pressure   . Labile blood glucose   . Hyperglycemia   . Acalculous cholecystitis   . Elevated LFTs   . Acute respiratory failure with hypoxia ([place])   . Abdominal pain 07/13/2016  . N&V (nausea and vomiting) 07/13/2016  . Sepsis ([place]) 07/13/2016  . Pleural effusion   . RUQ pain   . Anemia of renal disease 07/04/2016  . Diarrhea 07/04/2016  . Sinusitis nasal 07/03/2016  . Conjunctivitis of both eyes 07/03/2016  . ESRD (end stage renal disease) on dialysis ([place]) 06/12/2016  . Protein-calorie malnutrition, moderate ([place]) 06/12/2016  . Constipation   . 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 Physical deconditioning   . Closed fracture of shaft of left femur ([place])   . Adjustment disorder with mixed anxiety and depressed mood   . Postoperative pain   . Bilateral low back pain without sciatica   . Pressure ulcer   . Thrombocytopenia ([place])   . Arterial hypotension   . Functional diarrhea   . Anemia of chronic disease   . Multiple myeloma not having achieved remission ([place])   . Antineoplastic chemotherapy induced pancytopenia ([place])   . Low back pain   . Post-op pain   . Surgery, elective   . Acute blood loss anemia   . ESRD on dialysis ([place])   . Essential hypertension   . Urinary tract infection, site not specified   . Vitamin B12 deficiency   . Multiple myeloma ([place]) 05/14/2016  . Acute renal failure ([place])   . Pathological fracture of left femur due to neoplastic disease ([place])   . Anemia due to bone marrow failure ([place])   . 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 Occult blood positive stool 05/10/2016  . Malnutrition of moderate degree 05/10/2016       Component Value Date/Time   WBC 8.8 08/01/2016 0742   RBC 3.96 (L) 08/01/2016 0742   HGB 11.6 (L) 08/01/2016 0742   HGB 8.6 (L) 07/13/2016 0810   HCT 36.7 (L) 08/01/2016 0742   HCT 26.6 (L) 07/13/2016 0810   PLT 111 (L) 08/01/2016 0742   PLT 86 (L) 07/13/2016 0810   MCV  92.7 08/01/2016 0742   MCV 92.1 07/13/2016 0810   LYMPHSABS 2.2 07/29/2016 0831   LYMPHSABS 0.2 (L) 07/13/2016 0810   MONOABS 0.5 07/29/2016 0831  MONOABS 0.7 07/13/2016 0810   EOSABS 0.0 07/29/2016 0831   EOSABS 0.0 07/13/2016 0810   BASOSABS 0.0 07/29/2016 0831   BASOSABS 0.0 07/13/2016 0810        Component Value Date/Time   NA 135 08/01/2016 0742   NA 134 (L) 07/13/2016 0810   K 4.7 08/01/2016 0742   K 4.2 07/13/2016 0810   CL 103 08/01/2016 0742   CO2 24 08/01/2016 0742   CO2 23 07/13/2016 0810   GLUCOSE 194 (H) 08/01/2016 0742   GLUCOSE 152 (H) 07/13/2016 0810   BUN 49 (H) 08/01/2016 0742   BUN 28.6 (H) 07/13/2016 0810   CREATININE 2.45 (H) 08/01/2016 0742   CREATININE 3.3 (HH) 07/13/2016 0810   CALCIUM 8.0 (L) 08/01/2016 0742   CALCIUM 8.4 07/13/2016 0810   PROT 4.7 (L) 08/02/2016 0506   PROT 6.4 07/13/2016 0810   ALBUMIN 2.0 (L) 08/02/2016 0506   ALBUMIN 2.3 (L) 07/13/2016 0810   AST 47 (H) 08/02/2016 0506   AST 67 (H) 07/13/2016 0810   ALT 68 (H) 08/02/2016 0506   ALT 74 (H) 07/13/2016 0810   ALKPHOS 137 (H) 08/02/2016 0506   ALKPHOS 251 (H) 07/13/2016 0810   BILITOT 0.9 08/02/2016 0506   BILITOT 3.43 (H) 07/13/2016 0810   GFRNONAA 25 (L) 08/01/2016 0742   GFRAA 30 (L) 08/01/2016 0742    Lab Results  Component Value Date   HGBA1C 5.8 (H) 07/29/2016    No results found for: CHOL, HDL, LDLCALC, LDLDIRECT, TRIG, CHOLHDL   Nm Hepatobiliary Liver Func  Result Date: 07/14/2016 CLINICAL DATA:  Right upper quadrant pain. Abnormal sonographic appearance of the gallbladder. EXAM: NUCLEAR MEDICINE HEPATOBILIARY IMAGING TECHNIQUE: Sequential images of the abdomen were obtained out to 120 minutes following intravenous administration of radiopharmaceutical. After IV administered of 3 mg morphine, imaging obtained for an additional 30 minutes. RADIOPHARMACEUTICALS:  5.2  mCi Tc-67m Choletec IV COMPARISON:  Right upper quadrant ultrasound yesterday. FINDINGS: Imaging  obtained for 2 hours and 30 minutes. There is mildly delayed hepatic uptake. There is no visualized tracer accumulation within the intrahepatic ducts. At 35 minutes tracer is visualized in the midline, which may be duodenum or common bile duct. There is midline tracer increases in intensity with to and fro trace accumulation proximal and distally likely refluxed into the stomach and passing distally proximal small bowel. After 120 minutes, no gallbladder activity. As small bowel activity was visualized, 3 mg of morphine was administered. Increased GI tract activity, no gallbladder visualization after morphine. IMPRESSION: Gallbladder not visualized after 2.5 hours as well as the administration of morphine. In addition however the intra and extrahepatic bile ducts are not well seen. Nonvisualization of the gallbladder may be secondary to cholecystitis versus underlying hepatocellular dysfunction (patient bilirubin greater than 3). Limited visualization of the biliary tree activity is likely secondary to hepatic cellular dysfunction. Biliary obstruction is felt much less likely as GI tract activity is observed. Electronically Signed   By: MJeb LeveringM.D.   On: 07/14/2016 02:59   Dg Chest Port 1 View  Result Date: 07/13/2016 CLINICAL DATA:  Effusion. Abnormal labs.Hx of CKD, dialysis patient.Ex-smoker, quit ~30 years ago. EXAM: PORTABLE CHEST 1 VIEW COMPARISON:  05/24/2016 FINDINGS: Cardiac silhouette is normal in size. No mediastinal or hilar masses or evidence of adenopathy. There is vascular congestion. Left greater than right pleural effusions are noted. There is some hazy opacity in the lung bases, likely due to atelectasis. No pneumothorax. Right internal jugular dual-lumen central venous  catheter is stable and well positioned. IMPRESSION: 1. Small, left greater than right, pleural effusions with associated lung base opacity, most likely atelectasis. 2. Central vascular congestion without overt pulmonary  edema. Electronically Signed   By: Lajean Manes M.D.   On: 07/13/2016 17:27   US Abdomen Limited Ruq  Result Date: 07/13/2016 CLINICAL DATA:  Right upper quadrant pain for 2 days. Nausea and vomiting for 1 day. EXAM: US ABDOMEN LIMITED - RIGHT UPPER QUADRANT COMPARISON:  None. FINDINGS: Gallbladder: The gallbladder is mildly distended measuring 5.2 cm in transverse dimension. This also mild gallbladder wall thickening and gallbladder sludge. No definite gallstones or pericholecystic fluid. Negative sonographic Murphy sign. Common bile duct: Diameter: 4.1 mm Liver: Normal echogenicity without focal lesion or biliary dilatation. Small amount of right upper quadrant ascites. Right pleural effusion noted. IMPRESSION: 1. Distended gallbladder with mild gallbladder wall thickening and gallbladder sludge. Could not exclude acalculous cholecystitis. 2. Normal caliber common bile duct. 3. Normal liver. 4. Small amount of right upper quadrant ascites and right pleural effusion. Electronically Signed   By: Marijo Sanes M.D.   On: 07/13/2016 12:05    Not all labs, radiology exams or other studies done during hospitalization come through on my EPIC note; however they are reviewed by me.    Assessment and Plan  SEPSIS 2/2 KLEBSIELLA PNEUMONIAE BACTEREMIA FROM ACALCULOUS CHOLECYSTIS WITH TRANS AMINITIS - BC 8/3 positive, MRI/MRCP was consistent with cholecystitis, was seen by general surgery and IR and underwent gallbladder Perc drain on 07/17/16 - per general surgery drain should be left in for 6-8 weeks. Transitioned to Levaquin for 3 week course stop date of 08/10/2016 SNF - admitted for OT/PT, GB drain care and levaquin QOD with a stop date of 8/31  ACUTE RESPIRATORY FAILURE WITH HYPOXIA SNF- pt is now off O2 without problems  MULTIPLE MYELOMA- Follows with Dr. Alvy Bimler, - pt with  Anemia/thrombocytopenia so chemotherapy treatment on hold; currently on Decadron, per Dr. Alvy Bimler  With dose adjustment on  08/01/2016. SNF - continue decadron  ANEMIA AND THrOMBOCYTOPENIA- Oncology consulted, was givenAranesp, HIT panel negative, continue steroids, Received PRBC on 07/25/16 with HD, Platelets within range of 80s-120s currently SNF - f/u CBC  ESRD ON DIALYSIS WITH HYPOTENSION, AND HYPONATREMIA-dialysis on Tuesday, Thursday and Saturday schedule. Had hypotension and midodrine TID started with good effect; hyponatremia will be followed/treated by dialysis SNF - cont inue midodrine 10 mg TID with daily BP  HYPERGLYCEMIA FROM STEROID USE- - decadron dose was decreased 8/22; SSI for control in hospital SNF - cont SSI with CBG ac and qHS  GERD SNF - CONT PROTONIX 40 MG DAILY  VITAMIN D DEFICIENCY SNF- cont replacement 50,000 u weekly  VITAMIN B12 DEF SNF- cont replacement a t 1000 mcg daily   Time spent > 45 min;> 50% of time with patient was spent reviewing records, labs, tests and studies, counseling and developing plan of care  Webb Silversmith D. Sheppard Coil, MD

## 2016-08-03 NOTE — Patient Outreach (Signed)
Chart reviewed. Noted Thomas [name] discharged to [facility]. Requested thru [place] Management office for [facility] Licensed CSW to be assigned for follow up while at [facility].   Marthenia Rolling, MSN-Ed, RN,BSN [facility] Liaison [phone]

## 2016-08-04 ENCOUNTER — Encounter: Payer: Self-pay | Admitting: Internal Medicine

## 2016-08-04 ENCOUNTER — Other Ambulatory Visit: Payer: Self-pay | Admitting: *Deleted

## 2016-08-04 ENCOUNTER — Telehealth: Payer: Self-pay | Admitting: Hematology and Oncology

## 2016-08-04 NOTE — Patient Outreach (Signed)
[facility]) Care Management  08/04/2016  Thomas [name] 1946-03-17 [id number]   CSW contacted patient by phone today- he is currently at [facility] for rehab. Plans for initial visit set for Monday, 08/07/16.    Eduard Clos, MSW, [place] Worker  [place] [phone]

## 2016-08-04 NOTE — Telephone Encounter (Signed)
lvm to inform pt of 9/11 appts per LOS

## 2016-08-05 ENCOUNTER — Encounter: Payer: Self-pay | Admitting: Internal Medicine

## 2016-08-05 DIAGNOSIS — A414 Sepsis due to anaerobes: Secondary | ICD-10-CM | POA: Insufficient documentation

## 2016-08-05 DIAGNOSIS — E871 Hypo-osmolality and hyponatremia: Secondary | ICD-10-CM | POA: Insufficient documentation

## 2016-08-05 DIAGNOSIS — R740 Nonspecific elevation of levels of transaminase and lactic acid dehydrogenase [LDH]: Secondary | ICD-10-CM

## 2016-08-05 DIAGNOSIS — K219 Gastro-esophageal reflux disease without esophagitis: Secondary | ICD-10-CM | POA: Insufficient documentation

## 2016-08-05 DIAGNOSIS — E559 Vitamin D deficiency, unspecified: Secondary | ICD-10-CM | POA: Insufficient documentation

## 2016-08-05 DIAGNOSIS — R7401 Elevation of levels of liver transaminase levels: Secondary | ICD-10-CM | POA: Insufficient documentation

## 2016-08-07 ENCOUNTER — Other Ambulatory Visit: Payer: Self-pay | Admitting: *Deleted

## 2016-08-07 ENCOUNTER — Encounter (HOSPITAL_COMMUNITY): Payer: Self-pay

## 2016-08-07 ENCOUNTER — Emergency Department (HOSPITAL_COMMUNITY)
Admission: EM | Admit: 2016-08-07 | Discharge: 2016-08-08 | Disposition: A | Discharge status: Home / Self Care | Payer: PPO | Attending: Emergency Medicine | Admitting: Emergency Medicine

## 2016-08-07 DIAGNOSIS — N186 End stage renal disease: Secondary | ICD-10-CM | POA: Diagnosis not present

## 2016-08-07 DIAGNOSIS — Z87891 Personal history of nicotine dependence: Secondary | ICD-10-CM | POA: Diagnosis not present

## 2016-08-07 DIAGNOSIS — Z794 Long term (current) use of insulin: Secondary | ICD-10-CM | POA: Diagnosis not present

## 2016-08-07 DIAGNOSIS — Z992 Dependence on renal dialysis: Secondary | ICD-10-CM | POA: Diagnosis not present

## 2016-08-07 DIAGNOSIS — Z4803 Encounter for change or removal of drains: Secondary | ICD-10-CM | POA: Diagnosis present

## 2016-08-07 DIAGNOSIS — Z4682 Encounter for fitting and adjustment of non-vascular catheter: Secondary | ICD-10-CM

## 2016-08-07 DIAGNOSIS — Z79899 Other long term (current) drug therapy: Secondary | ICD-10-CM | POA: Insufficient documentation

## 2016-08-07 NOTE — ED Notes (Signed)
Patient Alert and oriented X4. Stable and ambulatory. Patient verbalized understanding of the discharge instructions.  Patient belongings were taken by the patient. Patient waiting on PTAR to take patient back to Thomas [name]

## 2016-08-07 NOTE — ED Triage Notes (Signed)
Patient is from [facility] facility for a biliary tube problem.  He was discharged here on Wednesday for the tube placement.  Has a 10cc flush needed for the tube however 20cc was used.  Bleeding and discomfort was noted

## 2016-08-07 NOTE — ED Provider Notes (Signed)
[facility] DEPT Provider Note   CSN: [id number] Arrival date & time: 08/07/16  2159     History   Chief Complaint Chief Complaint  Patient presents with  . Other    Billary Tube problem    HPI Thomas [name] is a 70 y.o. male.  HPI Thomas [name] is a 70 y.o. male with hx of MM, ESRD on hemodialysis, cholecystitis, presents to ED with complaint of blood in his biliary drain tube. Pt states he was recently released from the hospital into a rehab facility with this drain. States a nurse normally flushes his drain with 10cc of saline. States today a new nurse was doing it and put approximately 20-30cc. Pt states when she pushed in saline he felt some "pressure" in the right upper quadrant which made him scream. States it was not really pain, but pressure. States that since then no pain, however noticed some blood in his drainage tube.  Pt is here for evaluation of bloody drainage.   Past Medical History:  Diagnosis Date  . Anemia    ANEMIA OF RENAL DISEASE  . Cancer ([place])   . Chronic kidney disease   . Compression fracture   . Elevated LFTs 07/2016  . Thrombocytopenia ([place]) 07/2016    Patient Active Problem List   Diagnosis Date Noted  . Klebsiella pneumoniae sepsis ([place]) 08/05/2016  . Transaminitis 08/05/2016  . Hyponatremia 08/05/2016  . GERD (gastroesophageal reflux disease) 08/05/2016  . Vitamin D deficiency 08/05/2016  . Pain   . Labile blood pressure   . Labile blood glucose   . Steroid-induced hyperglycemia   . Acalculous cholecystitis   . Elevated LFTs   . Acute respiratory failure with hypoxia ([place])   . Abdominal pain 07/13/2016  . N&V (nausea and vomiting) 07/13/2016  . Sepsis ([place]) 07/13/2016  . Pleural effusion   . RUQ pain   . Anemia of renal disease 07/04/2016  . Diarrhea 07/04/2016  . Sinusitis nasal 07/03/2016  . Conjunctivitis of both eyes 07/03/2016  . ESRD (end stage renal disease) on dialysis ([place]) 06/12/2016  . Protein-calorie malnutrition,  moderate ([place]) 06/12/2016  . Constipation   . 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 Physical deconditioning   . Closed fracture of shaft of left femur ([place])   . Adjustment disorder with mixed anxiety and depressed mood   . Postoperative pain   . Bilateral low back pain without sciatica   . Pressure ulcer   . Thrombocytopenia ([place])   . Arterial hypotension   . Functional diarrhea   . Anemia of chronic disease   . Multiple myeloma not having achieved remission ([place])   . Antineoplastic chemotherapy induced pancytopenia ([place])   . Low back pain   . Post-op pain   . Surgery, elective   . Acute blood loss anemia   . ESRD on dialysis ([place])   . Essential hypertension   . Urinary tract infection, site not specified   . Vitamin B12 deficiency   . Multiple myeloma ([place]) 05/14/2016  . Acute renal failure ([place])   . Pathological fracture of left femur due to neoplastic disease ([place])   . Anemia due to bone marrow failure ([place])   . 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 Occult blood positive stool 05/10/2016  . Malnutrition of moderate degree 05/10/2016    Past Surgical History:  Procedure Laterality Date  . AV FISTULA PLACEMENT Left 05/24/2016   Procedure: ARTERIOVENOUS (AV) FISTULA CREATION-LEFT;  Surgeon: Conrad Waukon, MD;  Location: [facility]  OR;  Service: Vascular;  Laterality: Left;  . BONE BIOPSY Left 05/14/2016   Procedure: LEFT FEMORAL BIOPSY WITH INTRAOPERATIVE FROZEN SECTIONS ;  Surgeon: Rod Can, MD;  Location: [facility];  Service: Orthopedics;  Laterality: Left;  . FEMUR IM NAIL Left 05/14/2016   Procedure: INTRAMEDULLARY (IM) NAIL FEMORAL;  Surgeon: Rod Can, MD;  Location: [facility];  Service: Orthopedics;  Laterality: Left;  . IR GENERIC HISTORICAL  07/17/2016   IR PERC CHOLECYSTOSTOMY 07/17/2016 Arne Cleveland, MD [facility]INTER[facility] RAD  . MOUTH SURGERY    . SP CHOLECYSTOMY  07/2016   cholecystostomy drain placed under [place] and fluoro       Home Medications    Prior to Admission  medications   Medication Sig Start Date End Date Taking? Authorizing Provider  acetaminophen (TYLENOL) 325 MG tablet Take 2 tablets (650 mg total) by mouth every 6 (six) hours as needed for mild pain (or Fever >/= 101). 05/29/16   Nishant Dhungel, MD  acyclovir (ZOVIRAX) 200 MG capsule Take 1 capsule (200 mg total) by mouth 2 (two) times daily. 07/03/16   Heath Lark, MD  ALPRAZolam (XANAX) 0.25 MG tablet Take 1 tablet (0.25 mg total) by mouth 2 (two) times daily as needed for anxiety. 08/02/16   Thurnell Lose, MD  bisacodyl (DULCOLAX) 10 MG suppository Place 1 suppository (10 mg total) rectally daily as needed for moderate constipation. 08/02/16   Thurnell Lose, MD  calcium acetate (PHOSLO) 667 MG capsule Take 1 capsule (667 mg total) by mouth 2 (two) times daily with a meal. 06/08/16   Lavon Paganini Angiulli, PA-C  cyanocobalamin 1000 MCG tablet Take 1 tablet (1,000 mcg total) by mouth daily. 06/08/16   Lavon Paganini Angiulli, PA-C  dexamethasone (DECADRON) 4 MG tablet Take 1 tablet (4 mg total) by mouth daily. 08/02/16   Thurnell Lose, MD  insulin aspart (NOVOLOG) 100 UNIT/ML injection Before each meal 3 times a day, 140-199 - 2 units, 200-250 - 4 units, 251-299 - 6 units,  300-349 - 8 units,  350 or above 10 units. Dispense syringes and needles as needed, Ok to switch to PEN if approved. Substitute to any brand approved. DX DM2, Code E11.65 08/02/16   Thurnell Lose, MD  levofloxacin (LEVAQUIN) 500 MG tablet Take 1 tablet (500 mg total) by mouth every other day. 08/02/16 08/10/16  Thurnell Lose, MD  loperamide (IMODIUM A-D) 2 MG tablet Take 2 mg by mouth 4 (four) times daily as needed for diarrhea or loose stools.    Historical Provider, MD  loratadine (CLARITIN) 10 MG tablet Take 10 mg by mouth daily as needed for allergies.     Historical Provider, MD  midodrine (PROAMATINE) 10 MG tablet Take 1 tablet (10 mg total) by mouth 3 (three) times daily with meals. 08/02/16   Thurnell Lose, MD  multivitamin  (RENA-VIT) TABS tablet Take 1 tablet by mouth at bedtime. 06/08/16   Lavon Paganini Angiulli, PA-C  ondansetron (ZOFRAN) 8 MG tablet Take 1 tablet (8 mg total) by mouth every 8 (eight) hours as needed for nausea. 07/03/16   Heath Lark, MD  oxyCODONE (OXY IR/ROXICODONE) 5 MG immediate release tablet Take 1 tablet (5 mg total) by mouth every 4 (four) hours as needed for moderate pain. 08/02/16   Thurnell Lose, MD  pantoprazole (PROTONIX) 40 MG tablet Take 1 tablet (40 mg total) by mouth daily. 06/08/16   Lavon Paganini Angiulli, PA-C  polyethylene glycol (MIRALAX / GLYCOLAX) packet Take 17 g  by mouth daily as needed for mild constipation or moderate constipation. 08/02/16   Thurnell Lose, MD  protein supplement (RESOURCE BENEPROTEIN) POWD Take 6 g by mouth 3 (three) times daily with meals. 08/02/16   Thurnell Lose, MD  tobramycin (TOBREX) 0.3 % ophthalmic solution Place 1 drop into both eyes every 4 (four) hours. 07/03/16   Heath Lark, MD  triamcinolone (NASACORT AQ) 55 MCG/ACT AERO nasal inhaler Place 2 sprays into the nose daily. 07/03/16   Heath Lark, MD  Vitamin D, Ergocalciferol, (DRISDOL) 50000 units CAPS capsule Take 1 capsule (50,000 Units total) by mouth every 7 (seven) days. 06/08/16   Lavon Paganini Angiulli, PA-C    Family History Family History  Problem Relation Age of Onset  . Diabetes Father     Social History Social History  Substance Use Topics  . Smoking status: Former Research scientist (life sciences)  . Smokeless tobacco: Never Used  . Alcohol use No     Allergies   Penicillins; Dilaudid [hydromorphone hcl]; Fish allergy; and Codeine   Review of Systems Review of Systems  Constitutional: Negative for chills and fever.  Respiratory: Negative for cough, chest tightness and shortness of breath.   Cardiovascular: Negative for chest pain, palpitations and leg swelling.  Gastrointestinal: Positive for abdominal pain. Negative for abdominal distention, diarrhea, nausea and vomiting.  Genitourinary: Negative for  dysuria, frequency, hematuria and urgency.  Skin: Negative for rash.  Allergic/Immunologic: Negative for immunocompromised state.  Neurological: Negative for dizziness, weakness, light-headedness, numbness and headaches.  All other systems reviewed and are negative.    Physical Exam Updated Vital Signs BP 149/66 (BP Location: Right Arm)   Pulse 83   Temp 98.4 F (36.9 C) (Oral)   Resp 18   Ht 6' 2"  (1.88 m)   Wt 68.9 kg   SpO2 99%   BMI 19.52 kg/m   Physical Exam  Constitutional: He appears well-developed and well-nourished. No distress.  HENT:  Head: Normocephalic and atraumatic.  Eyes: Conjunctivae are normal.  Neck: Neck supple.  Cardiovascular: Normal rate, regular rhythm and normal heart sounds.   Pulmonary/Chest: Effort normal. No respiratory distress. He has no wheezes. He has no rales.  Abdominal: Soft. Bowel sounds are normal. He exhibits no distension. There is no tenderness. There is no rebound.  Drain in place in RUQ. No bleeding or signs of infection around the drain. Drain appears to be in place. Yellowish pink drainage in the drain bag. No abdominal tenderness.  Musculoskeletal: He exhibits no edema.  Neurological: He is alert.  Skin: Skin is warm and dry.  Nursing note and vitals reviewed.    ED Treatments / Results  Labs (all labs ordered are listed, but only abnormal results are displayed) Labs Reviewed - No data to display  EKG  EKG Interpretation None       Radiology No results found.  Procedures Procedures (including critical care time)  Medications Ordered in ED Medications - No data to display   Initial Impression / Assessment and Plan / ED Course  I have reviewed the triage vital signs and the nursing notes.  Pertinent labs & imaging results that were available during my care of the patient were reviewed by me and considered in my medical decision making (see chart for details).  Clinical Course    Pt with bleeding from  biliary drain after flushing it. No pain. No abdominal tenderness. Discusse with Dr. Pascal Lux, IR, who advised no further imaging or tx needed at this time as long as  there was no obvious dislodgement and there is no pain. Drain irrigated in ED with saline. No problems. Plan to dc home with return precautions.   Final Clinical Impressions(s) / ED Diagnoses   Final diagnoses:  Encounter for biliary drainage tube placement    New Prescriptions New Prescriptions   No medications on file     Jeannett Senior, PA-C 08/07/16 2308    Merrily Pew, MD 08/10/16 [phone]

## 2016-08-07 NOTE — Discharge Instructions (Signed)
Continue to flush with saline. Use 10cc at a time. Return if increased bleeding or pain or if drain not draining.

## 2016-08-07 NOTE — Patient Outreach (Signed)
[facility]) Care Management  [facility] Social Work  08/07/2016  Coury Schoepp 1946/09/17 [id number]  Subjective:  Patient is residing in [facility] after hospital stay resulting in gallbladder that will need to be removed once infection is cleared. Patient has a tube draining the gallbladder and he is getting wound care for sacrum unstageable ulcer, HD (Tues, Thurs, Sat) and PT/OT.    Encounter Medications:  Outpatient Encounter Prescriptions as of 08/07/2016  Medication Sig  . acetaminophen (TYLENOL) 325 MG tablet Take 2 tablets (650 mg total) by mouth every 6 (six) hours as needed for mild pain (or Fever >/= 101).  Marland Kitchen acyclovir (ZOVIRAX) 200 MG capsule Take 1 capsule (200 mg total) by mouth 2 (two) times daily.  Marland Kitchen ALPRAZolam (XANAX) 0.25 MG tablet Take 1 tablet (0.25 mg total) by mouth 2 (two) times daily as needed for anxiety.  . bisacodyl (DULCOLAX) 10 MG suppository Place 1 suppository (10 mg total) rectally daily as needed for moderate constipation.  . calcium acetate (PHOSLO) 667 MG capsule Take 1 capsule (667 mg total) by mouth 2 (two) times daily with a meal.  . cyanocobalamin 1000 MCG tablet Take 1 tablet (1,000 mcg total) by mouth daily.  Marland Kitchen dexamethasone (DECADRON) 4 MG tablet Take 1 tablet (4 mg total) by mouth daily.  . insulin aspart (NOVOLOG) 100 UNIT/ML injection Before each meal 3 times a day, 140-199 - 2 units, 200-250 - 4 units, 251-299 - 6 units,  300-349 - 8 units,  350 or above 10 units. Dispense syringes and needles as needed, Ok to switch to PEN if approved. Substitute to any brand approved. DX DM2, Code E11.65  . levofloxacin (LEVAQUIN) 500 MG tablet Take 1 tablet (500 mg total) by mouth every other day.  . loperamide (IMODIUM A-D) 2 MG tablet Take 2 mg by mouth 4 (four) times daily as needed for diarrhea or loose stools.  Marland Kitchen loratadine (CLARITIN) 10 MG tablet Take 10 mg by mouth daily as needed for allergies.   . midodrine (PROAMATINE) 10 MG tablet Take  1 tablet (10 mg total) by mouth 3 (three) times daily with meals.  . multivitamin (RENA-VIT) TABS tablet Take 1 tablet by mouth at bedtime.  . ondansetron (ZOFRAN) 8 MG tablet Take 1 tablet (8 mg total) by mouth every 8 (eight) hours as needed for nausea.  Marland Kitchen oxyCODONE (OXY IR/ROXICODONE) 5 MG immediate release tablet Take 1 tablet (5 mg total) by mouth every 4 (four) hours as needed for moderate pain.  . pantoprazole (PROTONIX) 40 MG tablet Take 1 tablet (40 mg total) by mouth daily.  . polyethylene glycol (MIRALAX / GLYCOLAX) packet Take 17 g by mouth daily as needed for mild constipation or moderate constipation.  . protein supplement (RESOURCE BENEPROTEIN) POWD Take 6 g by mouth 3 (three) times daily with meals.  Marland Kitchen tobramycin (TOBREX) 0.3 % ophthalmic solution Place 1 drop into both eyes every 4 (four) hours.  . triamcinolone (NASACORT AQ) 55 MCG/ACT AERO nasal inhaler Place 2 sprays into the nose daily.  . Vitamin D, Ergocalciferol, (DRISDOL) 50000 units CAPS capsule Take 1 capsule (50,000 Units total) by mouth every 7 (seven) days.   No facility-administered encounter medications on file as of 08/07/2016.     Functional Status:  In your present state of health, do you have any difficulty performing the following activities: 08/07/2016 07/18/2016  Hearing? - -  Vision? - -  Difficulty concentrating or making decisions? - -  Walking or climbing stairs? - -  Dressing or bathing? - -  Doing errands, shopping? - Y  Preparing Food and eating ? Y -  Using the Toilet? Y -  In the past six months, have you accidently leaked urine? N -  Do you have problems with loss of bowel control? Y -  Managing your Medications? Y -  Managing your Finances? Y -  Housekeeping or managing your Housekeeping? Y -  Some recent data might be hidden    Fall/Depression Screening:  PHQ 2/9 Scores 08/07/2016 06/29/2016  PHQ - 2 Score 3 0  PHQ- 9 Score 6 -    Assessment:  Patient is getting SNF level care at  [facility] to provide gallbladder drain care, wound care and PT/OT. He is also going to HD 3 days a week.  Patient and wife are hopeful he will be able to get home soon and they are hoping the HD may be temporary and not a permanent need.  CSW spoke with SNF staff and will follow along for collaboration with family and SNF as well as for dc planning/support.     Plan:   CSW plans follow up call to patient/wife and SNF rep in approx. 1 week.   [facility] CM Care Plan Problem One   Flowsheet Row Most Recent Value  Care Plan Problem One  -- [Patient admitted to [facility] for rehab.]  Role Documenting the Problem One  Clinical Social Worker  Care Plan for Problem One  Active  [facility] Long Term Goal (31-90 days)  Patient will participate with therapies in SNF setting to become independent and safe return home in the next 90 days.  [facility] Long Term Goal Start Date  08/07/16  Interventions for Problem One Long Term Goal  CSW discussed the importance of his daily participation with PT and OT to regain his independence with ADL's and mobility.  [facility] CM Short Term Goal #1 (0-30 days)  Patient will participate in daily therapies, wound care and treatment plan over the next 30 days.   [facility] CM Short Term Goal #1 Start Date  08/07/16  Interventions for Short Term Goal #1  CSW collaborating with patient, wife and SNF staff regarding dc plan.     Eduard Clos, MSW, [place] Worker  [place] [phone]

## 2016-08-08 ENCOUNTER — Non-Acute Institutional Stay (SKILLED_NURSING_FACILITY): Payer: PPO | Admitting: Internal Medicine

## 2016-08-08 ENCOUNTER — Encounter: Payer: Self-pay | Admitting: Internal Medicine

## 2016-08-08 DIAGNOSIS — K819 Cholecystitis, unspecified: Secondary | ICD-10-CM

## 2016-08-08 NOTE — ED Notes (Signed)
Patient and family updated on the delay of transport back to [facility]. [facility] was contacted twice about the delay and they stated they were behind and aware they need to come get him.

## 2016-08-08 NOTE — Progress Notes (Signed)
MRN: [id number] Name: Thomas [name]  Sex: male Age: 70 y.o. DOB: 03/23/1946  [place] #:  Facility/Room: [facility] / [facility] Level Of Care: SNF Provider: Noah Delaine. Sheppard Coil, MD Emergency Contacts: Extended Emergency Contact Information Primary Emergency Contact: Felipe,Hilda Address: [address] [facility] [place]          [place], [place] [address] Johnnette Litter of [place] Phone: [phone] Mobile Phone: [phone] Relation: Spouse Secondary Emergency Contact: Sherrine Maples [place] Mobile Phone: [phone] Relation: Daughter  Code Status: Full Code  Allergies: Penicillins; Dilaudid [hydromorphone hcl]; Fish allergy; and Codeine  Chief Complaint  Patient presents with  . Acute Visit    Acute    HPI: Patient is 70 y.o. male with acalculous cholecystitis and a biliary tube who was seen last night in the ED for blood in the tube after a nurse flushed the drain with 20 cc instead of 10 cc NS. I feel she probably disrupted a clot. Pt was sent home, no further needed and pt sees his surgeon tomorrow.Pt says that tube drained more blood after his dialysis today. Pt wants me to see the site just to make sure it is OK. Past Medical History:  Diagnosis Date  . Anemia    ANEMIA OF RENAL DISEASE  . Cancer ([place])   . Chronic kidney disease   . Compression fracture   . Elevated LFTs 07/2016  . Thrombocytopenia ([place]) 07/2016    Past Surgical History:  Procedure Laterality Date  . AV FISTULA PLACEMENT Left 05/24/2016   Procedure: ARTERIOVENOUS (AV) FISTULA CREATION-LEFT;  Surgeon: Conrad Ferris, MD;  Location: [facility];  Service: Vascular;  Laterality: Left;  . BONE BIOPSY Left 05/14/2016   Procedure: LEFT FEMORAL BIOPSY WITH INTRAOPERATIVE FROZEN SECTIONS ;  Surgeon: Rod Can, MD;  Location: [facility];  Service: Orthopedics;  Laterality: Left;  . FEMUR IM NAIL Left 05/14/2016   Procedure: INTRAMEDULLARY (IM) NAIL FEMORAL;  Surgeon: Rod Can, MD;  Location: [facility];  Service:  Orthopedics;  Laterality: Left;  . IR GENERIC HISTORICAL  07/17/2016   IR PERC CHOLECYSTOSTOMY 07/17/2016 Arne Cleveland, MD [facility]  . MOUTH SURGERY    . SP CHOLECYSTOMY  07/2016   cholecystostomy drain placed under [place] and fluoro      Medication List       Accurate as of 08/08/16  8:07 PM. Always use your most recent med list.          acetaminophen 325 MG tablet Commonly known as:  TYLENOL Take 2 tablets (650 mg total) by mouth every 6 (six) hours as needed for mild pain (or Fever >/= 101).   acyclovir 200 MG capsule Commonly known as:  ZOVIRAX Take 1 capsule (200 mg total) by mouth 2 (two) times daily.   ALPRAZolam 0.25 MG tablet Commonly known as:  XANAX Take 1 tablet (0.25 mg total) by mouth 2 (two) times daily as needed for anxiety.   bisacodyl 10 MG suppository Commonly known as:  DULCOLAX Place 1 suppository (10 mg total) rectally daily as needed for moderate constipation.   calcium acetate 667 MG capsule Commonly known as:  PHOSLO Take 1 capsule (667 mg total) by mouth 2 (two) times daily with a meal.   cyanocobalamin 1000 MCG tablet Take 1 tablet (1,000 mcg total) by mouth daily.   dexamethasone 4 MG tablet Commonly known as:  DECADRON Take 1 tablet (4 mg total) by mouth daily.   insulin aspart 100 UNIT/ML injection Commonly known as:  NOVOLOG Before  each meal 3 times a day, 140-199 - 2 units, 200-250 - 4 units, 251-299 - 6 units,  300-349 - 8 units,  350 or above 10 units. Dispense syringes and needles as needed, Ok to switch to PEN if approved. Substitute to any brand approved. DX DM2, Code E11.65   levofloxacin 500 MG tablet Commonly known as:  LEVAQUIN Take 1 tablet (500 mg total) by mouth every other day.   loperamide 2 MG tablet Commonly known as:  IMODIUM A-D Take 2 mg by mouth 4 (four) times daily as needed for diarrhea or loose stools.   loratadine 10 MG tablet Commonly known as:  CLARITIN Take 10 mg by mouth daily as needed for  allergies.   midodrine 10 MG tablet Commonly known as:  PROAMATINE Take 1 tablet (10 mg total) by mouth 3 (three) times daily with meals.   multivitamin Tabs tablet Take 1 tablet by mouth at bedtime.   ondansetron 8 MG tablet Commonly known as:  ZOFRAN Take 1 tablet (8 mg total) by mouth every 8 (eight) hours as needed for nausea.   oxyCODONE 5 MG immediate release tablet Commonly known as:  Oxy IR/ROXICODONE Take 1 tablet (5 mg total) by mouth every 4 (four) hours as needed for moderate pain.   pantoprazole 40 MG tablet Commonly known as:  PROTONIX Take 1 tablet (40 mg total) by mouth daily.   polyethylene glycol packet Commonly known as:  MIRALAX / GLYCOLAX Take 17 g by mouth daily as needed for mild constipation or moderate constipation.   protein supplement Powd Take 6 g by mouth 3 (three) times daily with meals.   tobramycin 0.3 % ophthalmic solution Commonly known as:  TOBREX Place 1 drop into both eyes every 4 (four) hours.   triamcinolone 55 MCG/ACT Aero nasal inhaler Commonly known as:  NASACORT AQ Place 2 sprays into the nose daily.   Vitamin D (Ergocalciferol) 50000 units Caps capsule Commonly known as:  DRISDOL Take 1 capsule (50,000 Units total) by mouth every 7 (seven) days.       No orders of the defined types were placed in this encounter.   Immunization History  Administered Date(s) Administered  . PPD Test 08/02/2016    Social History  Substance Use Topics  . Smoking status: Former Research scientist (life sciences)  . Smokeless tobacco: Never Used  . Alcohol use No    Review of Systems  DATA OBTAINED: from patient, wife GENERAL:  no fevers, fatigue, appetite changes SKIN: No itching, rash HEENT: No complaint RESPIRATORY: No cough, wheezing, SOB CARDIAC: No chest pain, palpitations, lower extremity edema  GI: No abdominal pain, No N/V/D or constipation, No heartburn or reflux  GU: No dysuria, frequency or urgency, or incontinence  MUSCULOSKELETAL: No  unrelieved bone/joint pain NEUROLOGIC: No headache, dizziness  PSYCHIATRIC: No overt anxiety or sadness  Vitals:   08/08/16 1539  BP: 126/80  Pulse: 88  Resp: 17  Temp: 97.5 F (36.4 C)    Physical Exam  GENERAL APPEARANCE: Alert, conversant, No acute distress  SKIN: No diaphoresis rash HEENT: Unremarkable RESPIRATORY: Breathing is even, unlabored. Lung sounds are clear   CARDIOVASCULAR: Heart RRR no murmurs, rubs or gallops. No peripheral edema  GASTROINTESTINAL: Abdomen is soft, non-tender, not distended w/ normal bowel sounds.; drain with clear brown bile and small amt of frank blood GENITOURINARY: Bladder non tender, not distended  MUSCULOSKELETAL: No abnormal joints or musculature NEUROLOGIC: Cranial nerves 2-12 grossly intact. Moves all extremities PSYCHIATRIC: Mood and affect appropriate to situation, no  behavioral issues  Patient Active Problem List   Diagnosis Date Noted  . Klebsiella pneumoniae sepsis ([place]) 08/05/2016  . Transaminitis 08/05/2016  . Hyponatremia 08/05/2016  . GERD (gastroesophageal reflux disease) 08/05/2016  . Vitamin D deficiency 08/05/2016  . Pain   . Labile blood pressure   . Labile blood glucose   . Steroid-induced hyperglycemia   . Acalculous cholecystitis   . Elevated LFTs   . Acute respiratory failure with hypoxia ([place])   . Abdominal pain 07/13/2016  . N&V (nausea and vomiting) 07/13/2016  . Sepsis ([place]) 07/13/2016  . Pleural effusion   . RUQ pain   . Anemia of renal disease 07/04/2016  . Diarrhea 07/04/2016  . Sinusitis nasal 07/03/2016  . Conjunctivitis of both eyes 07/03/2016  . 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 Protein-calorie malnutrition, moderate ([place]) 06/12/2016  . Constipation   . 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 Physical deconditioning   . Closed fracture of shaft of left femur ([place])   . Adjustment disorder with mixed anxiety and depressed mood   . Postoperative pain   . Bilateral low  back pain without sciatica   . Pressure ulcer   . Thrombocytopenia ([place])   . Arterial hypotension   . Functional diarrhea   . Anemia of chronic disease   . Multiple myeloma not having achieved remission ([place])   . Antineoplastic chemotherapy induced pancytopenia ([place])   . Low back pain   . Post-op pain   . Surgery, elective   . Acute blood loss anemia   . ESRD on dialysis ([place])   . Essential hypertension   . Urinary tract infection, site not specified   . Vitamin B12 deficiency   . Multiple myeloma ([place]) 05/14/2016  . Acute renal failure ([place])   . Pathological fracture of left femur due to neoplastic disease ([place])   . Anemia due to bone marrow failure ([place])   . 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 Occult blood positive stool 05/10/2016  . Malnutrition of moderate degree 05/10/2016    CBC    Component Value Date/Time   WBC 8.8 08/01/2016 0742   RBC 3.96 (L) 08/01/2016 0742   HGB 11.6 (L) 08/01/2016 0742   HGB 8.6 (L) 07/13/2016 0810   HCT 36.7 (L) 08/01/2016 0742   HCT 26.6 (L) 07/13/2016 0810   PLT 111 (L) 08/01/2016 0742   PLT 86 (L) 07/13/2016 0810   MCV 92.7 08/01/2016 0742   MCV 92.1 07/13/2016 0810   LYMPHSABS 2.2 07/29/2016 0831   LYMPHSABS 0.2 (L) 07/13/2016 0810   MONOABS 0.5 07/29/2016 0831   MONOABS 0.7 07/13/2016 0810   EOSABS 0.0 07/29/2016 0831   EOSABS 0.0 07/13/2016 0810   BASOSABS 0.0 07/29/2016 0831   BASOSABS 0.0 07/13/2016 0810    CMP     Component Value Date/Time   NA 135 08/01/2016 0742   NA 134 (L) 07/13/2016 0810   K 4.7 08/01/2016 0742   K 4.2 07/13/2016 0810   CL 103 08/01/2016 0742   CO2 24 08/01/2016 0742   CO2 23 07/13/2016 0810   GLUCOSE 194 (H) 08/01/2016 0742   GLUCOSE 152 (H) 07/13/2016 0810   BUN 49 (H) 08/01/2016 0742   BUN 28.6 (H) 07/13/2016 0810   CREATININE 2.45 (H) 08/01/2016 0742   CREATININE 3.3 (HH) 07/13/2016 0810   CALCIUM 8.0 (L) 08/01/2016 0742   CALCIUM 8.4 07/13/2016 0810   PROT 4.7 (L) 08/02/2016  0506   PROT  6.4 07/13/2016 0810   ALBUMIN 2.0 (L) 08/02/2016 0506   ALBUMIN 2.3 (L) 07/13/2016 0810   AST 47 (H) 08/02/2016 0506   AST 67 (H) 07/13/2016 0810   ALT 68 (H) 08/02/2016 0506   ALT 74 (H) 07/13/2016 0810   ALKPHOS 137 (H) 08/02/2016 0506   ALKPHOS 251 (H) 07/13/2016 0810   BILITOT 0.9 08/02/2016 0506   BILITOT 3.43 (H) 07/13/2016 0810   GFRNONAA 25 (L) 08/01/2016 0742   GFRAA 30 (L) 08/01/2016 0742    Assessment and Plan  ACALCULOUS CHOLECYSTITIS- pt's ED records were reviewed. As long as there is no pain or large bleeding, implying that the tube has been dislodged pt is fine. I discussed this with the pt and his wife. I agree and although there is some blood in the tube it is a small amt and pt has no pain at all; will monitor and follow surgical opinion from pt's visit tomorrow.   Time spent > 25 min;> 50% of time with patient was spent reviewing records, labs, tests and studies, counseling and developing plan of care  Noah Delaine. Sheppard Coil,  MD

## 2016-08-08 NOTE — ED Notes (Signed)
Patient taken by PTAR back to [facility].  Patient stable to go back at this point.

## 2016-08-10 ENCOUNTER — Telehealth: Payer: Self-pay | Admitting: *Deleted

## 2016-08-10 NOTE — Telephone Encounter (Signed)
Wife left VM for Dr. Alvy Bimler to inform pt now at [facility].  He has a scan to look at his [place] on 9/20.  She is asking when pt is supposed to f/u w/ Dr. Alvy Bimler?  He is scheduled for lab/MD and chemo on 9/11.

## 2016-08-16 ENCOUNTER — Encounter: Payer: Self-pay | Admitting: Internal Medicine

## 2016-08-16 ENCOUNTER — Other Ambulatory Visit: Payer: Self-pay | Admitting: *Deleted

## 2016-08-16 ENCOUNTER — Non-Acute Institutional Stay (SKILLED_NURSING_FACILITY): Payer: PPO | Admitting: Internal Medicine

## 2016-08-16 DIAGNOSIS — M62830 Muscle spasm of back: Secondary | ICD-10-CM

## 2016-08-16 NOTE — Patient Outreach (Signed)
[facility]) Care Management  08/16/2016  Kerrion Caraker 12-29-1945 [id number]   CSW rec'd call from SNF dc planner indicating a dc date as not been set- patient is progressing and is encouraged to be linked with a PCP prior to dc home.    Eduard Clos, MSW, [place] Worker  [place] [phone]

## 2016-08-16 NOTE — Progress Notes (Signed)
MRN: [id number] Name: Thomas [name]  Sex: male Age: 70 y.o. DOB: November 06, 1946  [place] #:  Facility/Room: [facility] Level Of Care: SNF Provider: Noah Delaine. Sheppard Coil, MD Emergency Contacts: Extended Emergency Contact Information Primary Emergency Contact: Braxton,Hilda Address: [address] [facility] [place]          [place],  [address] Johnnette Litter of [place] Phone: [phone] Mobile Phone: [phone] Relation: Spouse Secondary Emergency Contact: Sherrine Maples [place] Mobile Phone: [phone] Relation: Daughter  Code Status: Full Code  Allergies: Penicillins; Dilaudid [hydromorphone hcl]; Fish allergy; and Codeine  Chief Complaint  Patient presents with  . Acute Visit    Acute    HPI: Patient is 70 y.o. male who nursing asked me to see for c/o LBP. Pt admitted that it started when he leaned over to tie his shoe this am the pain "grabbed him".. It is worse with movement. Pain dies not radiate down legs. Pain did interfere with PT.  Past Medical History:  Diagnosis Date  . Anemia    ANEMIA OF RENAL DISEASE  . Cancer ([place])   . Chronic kidney disease   . Compression fracture   . Elevated LFTs 07/2016  . Thrombocytopenia ([place]) 07/2016    Past Surgical History:  Procedure Laterality Date  . AV FISTULA PLACEMENT Left 05/24/2016   Procedure: ARTERIOVENOUS (AV) FISTULA CREATION-LEFT;  Surgeon: Conrad Zeba, MD;  Location: [facility];  Service: Vascular;  Laterality: Left;  . BONE BIOPSY Left 05/14/2016   Procedure: LEFT FEMORAL BIOPSY WITH INTRAOPERATIVE FROZEN SECTIONS ;  Surgeon: Rod Can, MD;  Location: [facility];  Service: Orthopedics;  Laterality: Left;  . FEMUR IM NAIL Left 05/14/2016   Procedure: INTRAMEDULLARY (IM) NAIL FEMORAL;  Surgeon: Rod Can, MD;  Location: [facility];  Service: Orthopedics;  Laterality: Left;  . IR GENERIC HISTORICAL  07/17/2016   IR PERC CHOLECYSTOSTOMY 07/17/2016 Arne Cleveland, MD [facility]  . MOUTH SURGERY    . SP  CHOLECYSTOMY  07/2016   cholecystostomy drain placed under [place] and fluoro      Medication List       Accurate as of 08/16/16 11:59 PM. Always use your most recent med list.          acetaminophen 325 MG tablet Commonly known as:  TYLENOL Take 2 tablets (650 mg total) by mouth every 6 (six) hours as needed for mild pain (or Fever >/= 101).   acyclovir 200 MG capsule Commonly known as:  ZOVIRAX Take 1 capsule (200 mg total) by mouth 2 (two) times daily.   ALPRAZolam 0.25 MG tablet Commonly known as:  XANAX Take 1 tablet (0.25 mg total) by mouth 2 (two) times daily as needed for anxiety.   bisacodyl 10 MG suppository Commonly known as:  DULCOLAX Place 1 suppository (10 mg total) rectally daily as needed for moderate constipation.   calcium acetate 667 MG capsule Commonly known as:  PHOSLO Take 1 capsule (667 mg total) by mouth 2 (two) times daily with a meal.   cyanocobalamin 1000 MCG tablet Take 1 tablet (1,000 mcg total) by mouth daily.   dexamethasone 4 MG tablet Commonly known as:  DECADRON Take 1 tablet (4 mg total) by mouth daily.   insulin aspart 100 UNIT/ML injection Commonly known as:  NOVOLOG Before each meal 3 times a day, 140-199 - 2 units, 200-250 - 4 units, 251-299 - 6 units,  300-349 - 8 units,  350 or above 10 units. Dispense syringes and needles as needed,  Ok to switch to PEN if approved. Substitute to any brand approved. DX DM2, Code E11.65   loperamide 2 MG tablet Commonly known as:  IMODIUM A-D Take 2 mg by mouth 4 (four) times daily as needed for diarrhea or loose stools.   loratadine 10 MG tablet Commonly known as:  CLARITIN Take 10 mg by mouth daily as needed for allergies.   midodrine 10 MG tablet Commonly known as:  PROAMATINE Take 1 tablet (10 mg total) by mouth 3 (three) times daily with meals.   multivitamin Tabs tablet Take 1 tablet by mouth at bedtime.   ondansetron 8 MG tablet Commonly known as:  ZOFRAN Take 1 tablet (8 mg total)  by mouth every 8 (eight) hours as needed for nausea.   oxyCODONE 5 MG immediate release tablet Commonly known as:  Oxy IR/ROXICODONE Take 1 tablet (5 mg total) by mouth every 4 (four) hours as needed for moderate pain.   pantoprazole 40 MG tablet Commonly known as:  PROTONIX Take 1 tablet (40 mg total) by mouth daily.   polyethylene glycol packet Commonly known as:  MIRALAX / GLYCOLAX Take 17 g by mouth daily as needed for mild constipation or moderate constipation.   protein supplement Powd Take 6 g by mouth 3 (three) times daily with meals.   triamcinolone 55 MCG/ACT Aero nasal inhaler Commonly known as:  NASACORT AQ Place 2 sprays into the nose daily.   Vitamin D3 50000 units Caps Take 1 capsule by mouth once a week.       Meds ordered this encounter  Medications  . Cholecalciferol (VITAMIN D3) 50000 units CAPS    Sig: Take 1 capsule by mouth once a week.    Immunization History  Administered Date(s) Administered  . PPD Test 08/02/2016    Social History  Substance Use Topics  . Smoking status: Former Research scientist (life sciences)  . Smokeless tobacco: Never Used  . Alcohol use No    Review of Systems  DATA OBTAINED: from patient,wife GENERAL:  no fevers, fatigue, appetite changes SKIN: No itching, rash HEENT: No complaint RESPIRATORY: No cough, wheezing, SOB CARDIAC: No chest pain, palpitations, lower extremity edema  GI: No abdominal pain, No N/V/D or constipation, No heartburn or reflux  GU: No dysuria, frequency or urgency, or incontinence  MUSCULOSKELETAL: LBP as per HPI NEUROLOGIC: No headache, dizziness  PSYCHIATRIC: No overt anxiety or sadness  Vitals:   08/16/16 1101  BP: 133/65  Pulse: 86  Resp: 18  Temp: 97.4 F (36.3 C)    Physical Exam  GENERAL APPEARANCE: Alert, conversant, No acute distress  SKIN: No diaphoresis rash HEENT: Unremarkable RESPIRATORY: Breathing is even, unlabored. Lung sounds are clear   CARDIOVASCULAR: Heart RRR no murmurs, rubs or  gallops. No peripheral edema  GASTROINTESTINAL: Abdomen is soft, non-tender, not distended w/ normal bowel sounds.  GENITOURINARY: Bladder non tender, not distended  MUSCULOSKELETAL: No abnormal joints or musculature NEUROLOGIC: Cranial nerves 2-12 grossly intact. Moves all extremities PSYCHIATRIC: Mood and affect appropriate to situation, no behavioral issues  Patient Active Problem List   Diagnosis Date Noted  . Klebsiella pneumoniae sepsis ([place]) 08/05/2016  . Transaminitis 08/05/2016  . Hyponatremia 08/05/2016  . GERD (gastroesophageal reflux disease) 08/05/2016  . Vitamin D deficiency 08/05/2016  . Pain   . Labile blood pressure   . Labile blood glucose   . Steroid-induced hyperglycemia   . Acalculous cholecystitis   . Elevated LFTs   . Acute respiratory failure with hypoxia ([place])   . Abdominal pain 07/13/2016  .  N&V (nausea and vomiting) 07/13/2016  . Sepsis ([place]) 07/13/2016  . Pleural effusion   . RUQ pain   . Anemia of renal disease 07/04/2016  . Diarrhea 07/04/2016  . Sinusitis nasal 07/03/2016  . Conjunctivitis of both eyes 07/03/2016  . ESRD (end stage renal disease) on dialysis ([place]) 06/12/2016  . Protein-calorie malnutrition, moderate ([place]) 06/12/2016  . Constipation   . 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 Physical deconditioning   . Closed fracture of shaft of left femur ([place])   . Adjustment disorder with mixed anxiety and depressed mood   . Postoperative pain   . Bilateral low back pain without sciatica   . Pressure ulcer   . Thrombocytopenia ([place])   . Arterial hypotension   . Functional diarrhea   . Anemia of chronic disease   . Multiple myeloma not having achieved remission ([place])   . Antineoplastic chemotherapy induced pancytopenia ([place])   . Low back pain   . Post-op pain   . Surgery, elective   . Acute blood loss anemia   . ESRD on dialysis ([place])   . Essential hypertension   . Urinary tract infection, site not specified   . Vitamin B12 deficiency    . Multiple myeloma ([place]) 05/14/2016  . Acute renal failure ([place])   . Pathological fracture of left femur due to neoplastic disease ([place])   . Anemia due to bone marrow failure ([place])   . 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 Occult blood positive stool 05/10/2016  . Malnutrition of moderate degree 05/10/2016    CBC    Component Value Date/Time   WBC 8.8 08/01/2016 0742   RBC 3.96 (L) 08/01/2016 0742   HGB 11.6 (L) 08/01/2016 0742   HGB 8.6 (L) 07/13/2016 0810   HCT 36.7 (L) 08/01/2016 0742   HCT 26.6 (L) 07/13/2016 0810   PLT 111 (L) 08/01/2016 0742   PLT 86 (L) 07/13/2016 0810   MCV 92.7 08/01/2016 0742   MCV 92.1 07/13/2016 0810   LYMPHSABS 2.2 07/29/2016 0831   LYMPHSABS 0.2 (L) 07/13/2016 0810   MONOABS 0.5 07/29/2016 0831   MONOABS 0.7 07/13/2016 0810   EOSABS 0.0 07/29/2016 0831   EOSABS 0.0 07/13/2016 0810   BASOSABS 0.0 07/29/2016 0831   BASOSABS 0.0 07/13/2016 0810    CMP     Component Value Date/Time   NA 135 08/01/2016 0742   NA 134 (L) 07/13/2016 0810   K 4.7 08/01/2016 0742   K 4.2 07/13/2016 0810   CL 103 08/01/2016 0742   CO2 24 08/01/2016 0742   CO2 23 07/13/2016 0810   GLUCOSE 194 (H) 08/01/2016 0742   GLUCOSE 152 (H) 07/13/2016 0810   BUN 49 (H) 08/01/2016 0742   BUN 28.6 (H) 07/13/2016 0810   CREATININE 2.45 (H) 08/01/2016 0742   CREATININE 3.3 (HH) 07/13/2016 0810   CALCIUM 8.0 (L) 08/01/2016 0742   CALCIUM 8.4 07/13/2016 0810   PROT 4.7 (L) 08/02/2016 0506   PROT 6.4 07/13/2016 0810   ALBUMIN 2.0 (L) 08/02/2016 0506   ALBUMIN 2.3 (L) 07/13/2016 0810   AST 47 (H) 08/02/2016 0506   AST 67 (H) 07/13/2016 0810   ALT 68 (H) 08/02/2016 0506   ALT 74 (H) 07/13/2016 0810   ALKPHOS 137 (H) 08/02/2016 0506   ALKPHOS 251 (H) 07/13/2016 0810   BILITOT 0.9 08/02/2016 0506   BILITOT 3.43 (H) 07/13/2016 0810   GFRNONAA 25 (L) 08/01/2016 0742   GFRAA 30 (L) 08/01/2016 0630  Assessment and Plan  MUSCULOSKELETAL LBP- pt does not wish to  be on a lot of meds; we decided on a muscle relaxer at night robaxin 750 mg qhs; will monitor effect   Time spent > 25 min Alianny Toelle D. Sheppard Coil, MD

## 2016-08-17 ENCOUNTER — Inpatient Hospital Stay: Admission: RE | Admit: 2016-08-17 | Payer: Self-pay | Source: Ambulatory Visit | Admitting: Radiation Oncology

## 2016-08-17 ENCOUNTER — Telehealth: Payer: Self-pay | Admitting: *Deleted

## 2016-08-17 NOTE — Telephone Encounter (Signed)
Thomas [name] is a resident of [facility]. Informed that his appointment for today was canceled since he has dialysis today.  He is scheduled weekly for Tuesday,Thursday and Saturday. Per his wife's request he was already rescheduled for 09/12/16 on 08/08/16.

## 2016-08-18 ENCOUNTER — Encounter: Payer: Self-pay | Admitting: Internal Medicine

## 2016-08-18 ENCOUNTER — Non-Acute Institutional Stay (SKILLED_NURSING_FACILITY): Payer: PPO | Admitting: Internal Medicine

## 2016-08-18 DIAGNOSIS — M62830 Muscle spasm of back: Secondary | ICD-10-CM

## 2016-08-18 DIAGNOSIS — J302 Other seasonal allergic rhinitis: Secondary | ICD-10-CM | POA: Diagnosis not present

## 2016-08-18 DIAGNOSIS — R112 Nausea with vomiting, unspecified: Secondary | ICD-10-CM | POA: Diagnosis not present

## 2016-08-18 NOTE — Progress Notes (Signed)
MRN: [id number] Name: Thomas [name]  Sex: male Age: 70 y.o. DOB: 09-29-1946  [place] #:  Facility/Room: [facility] / [facility] Level Of Care: SNF Provider: Hennie Duos, MD Emergency Contacts: Extended Emergency Contact Information Primary Emergency Contact: Girten,Hilda Address: [address]          [place], [place] [address] [place] Phone: [phone] Mobile Phone: [phone] Relation: Spouse Secondary Emergency Contact: Sherrine Maples [place] Mobile Phone: [phone] Relation: Daughter  Code Status: Full Code  Allergies: Penicillins; Dilaudid [hydromorphone hcl]; Fish allergy; and Codeine  Chief Complaint  Patient presents with  . Acute Visit    Acute    HPI: Patient is 70 y.o. male who is being seen today for nausea and vomiting for past 2 days. No diarrhea or fever,has not felt achy or sick otherwise.More vomiting than nausea. Pt wwas started on robaxin 2 days ago. Pt admits he was given the robaxin at 2 am and had trouble getting up at 8 am. Pt also c/o allergies- runny nose, watery eyes and normally take a claritin for this.  Past Medical History:  Diagnosis Date  . Anemia    ANEMIA OF RENAL DISEASE  . Cancer ([place])   . Chronic kidney disease   . Compression fracture   . Elevated LFTs 07/2016  . Thrombocytopenia ([place]) 07/2016    Past Surgical History:  Procedure Laterality Date  . AV FISTULA PLACEMENT Left 05/24/2016   Procedure: ARTERIOVENOUS (AV) FISTULA CREATION-LEFT;  Surgeon: Conrad Cheverly, MD;  Location: [facility];  Service: Vascular;  Laterality: Left;  . BONE BIOPSY Left 05/14/2016   Procedure: LEFT FEMORAL BIOPSY WITH INTRAOPERATIVE FROZEN SECTIONS ;  Surgeon: Rod Can, MD;  Location: [facility];  Service: Orthopedics;  Laterality: Left;  . FEMUR IM NAIL Left 05/14/2016   Procedure: INTRAMEDULLARY (IM) NAIL FEMORAL;  Surgeon: Rod Can, MD;  Location: [facility];  Service: Orthopedics;  Laterality: Left;  . IR GENERIC  HISTORICAL  07/17/2016   IR PERC CHOLECYSTOSTOMY 07/17/2016 Arne Cleveland, MD [facility]-INTERV RAD  . MOUTH SURGERY    . SP CHOLECYSTOMY  07/2016   cholecystostomy drain placed under [place] and fluoro      Medication List       Accurate as of 08/18/16  2:14 PM. Always use your most recent med list.          acetaminophen 325 MG tablet Commonly known as:  TYLENOL Take 2 tablets (650 mg total) by mouth every 6 (six) hours as needed for mild pain (or Fever >/= 101).   acyclovir 200 MG capsule Commonly known as:  ZOVIRAX Take 1 capsule (200 mg total) by mouth 2 (two) times daily.   ALPRAZolam 0.25 MG tablet Commonly known as:  XANAX Take 1 tablet (0.25 mg total) by mouth 2 (two) times daily as needed for anxiety.   bisacodyl 10 MG suppository Commonly known as:  DULCOLAX Place 1 suppository (10 mg total) rectally daily as needed for moderate constipation.   calcium acetate 667 MG capsule Commonly known as:  PHOSLO Take 1 capsule (667 mg total) by mouth 2 (two) times daily with a meal.   cyanocobalamin 1000 MCG tablet Take 1 tablet (1,000 mcg total) by mouth daily.   dexamethasone 4 MG tablet Commonly known as:  DECADRON Take 1 tablet (4 mg total) by mouth daily.   insulin aspart 100 UNIT/ML injection Commonly known as:  NOVOLOG Before each meal 3 times a day, 140-199 - 2 units,  200-250 - 4 units, 251-299 - 6 units,  300-349 - 8 units,  350 or above 10 units. Dispense syringes and needles as needed, Ok to switch to PEN if approved. Substitute to any brand approved. DX DM2, Code E11.65   loperamide 2 MG tablet Commonly known as:  IMODIUM A-D Take 2 mg by mouth 4 (four) times daily as needed for diarrhea or loose stools.   loratadine 10 MG tablet Commonly known as:  CLARITIN Take 10 mg by mouth daily as needed for allergies.   methocarbamol 750 MG tablet Commonly known as:  ROBAXIN Take 750 mg by mouth at bedtime. Back muscle spasm   midodrine 10 MG tablet Commonly known as:   PROAMATINE Take 1 tablet (10 mg total) by mouth 3 (three) times daily with meals.   multivitamin Tabs tablet Take 1 tablet by mouth at bedtime.   NUTRITIONAL SUPPLEMENT PO Magic Cup twice a day for lunch and dinner.   ondansetron 8 MG tablet Commonly known as:  ZOFRAN Take 1 tablet (8 mg total) by mouth every 8 (eight) hours as needed for nausea.   oxyCODONE 5 MG immediate release tablet Commonly known as:  Oxy IR/ROXICODONE Take 1 tablet (5 mg total) by mouth every 4 (four) hours as needed for moderate pain.   pantoprazole 40 MG tablet Commonly known as:  PROTONIX Take 1 tablet (40 mg total) by mouth daily.   polyethylene glycol packet Commonly known as:  MIRALAX / GLYCOLAX Take 17 g by mouth daily as needed for mild constipation or moderate constipation.   protein supplement Powd Take 6 g by mouth 3 (three) times daily with meals.   triamcinolone 55 MCG/ACT Aero nasal inhaler Commonly known as:  NASACORT AQ Place 2 sprays into the nose daily.   Vitamin D3 50000 units Caps Take 1 capsule by mouth once a week.       Meds ordered this encounter  Medications  . methocarbamol (ROBAXIN) 750 MG tablet    Sig: Take 750 mg by mouth at bedtime. Back muscle spasm  . Nutritional Supplements (NUTRITIONAL SUPPLEMENT PO)    Sig: Magic Cup twice a day for lunch and dinner.    Immunization History  Administered Date(s) Administered  . PPD Test 08/02/2016    Social History  Substance Use Topics  . Smoking status: Former Research scientist (life sciences)  . Smokeless tobacco: Never Used  . Alcohol use No    Review of Systems  DATA OBTAINED: from patient, wife GENERAL:  no fevers, fatigue, appetite changes SKIN: No itching, rash HEENT: allergy sx RESPIRATORY: No cough, wheezing, SOB CARDIAC: No chest pain, palpitations, lower extremity edema  GI: No abdominal pain, +N/V, no diarrhea or constipation, No heartburn or reflux  GU: No dysuria, frequency or urgency, or incontinence  MUSCULOSKELETAL:  No unrelieved bone/joint pain NEUROLOGIC: No headache, dizziness  PSYCHIATRIC: No overt anxiety or sadness  Vitals:   08/18/16 1413  BP: 120/77  Pulse: 82  Resp: 18  Temp: 98.3 F (36.8 C)    Physical Exam  GENERAL APPEARANCE: Alert, conversant, No acute distress  SKIN: No diaphoresis rash HEENT: Unremarkable RESPIRATORY: Breathing is even, unlabored. Lung sounds are clear   CARDIOVASCULAR: Heart RRR no murmurs, rubs or gallops. No peripheral edema  GASTROINTESTINAL: Abdomen is soft, non-tender, not distended w/ normal bowel sounds.  GENITOURINARY: Bladder non tender, not distended  MUSCULOSKELETAL: No abnormal joints or musculature NEUROLOGIC: Cranial nerves 2-12 grossly intact. Moves all extremities PSYCHIATRIC: Mood and affect appropriate to situation, no behavioral issues  Patient Active Problem List   Diagnosis Date Noted  . Klebsiella pneumoniae sepsis ([place]) 08/05/2016  . Transaminitis 08/05/2016  . Hyponatremia 08/05/2016  . GERD (gastroesophageal reflux disease) 08/05/2016  . Vitamin D deficiency 08/05/2016  . Pain   . Labile blood pressure   . Labile blood glucose   . Steroid-induced hyperglycemia   . Acalculous cholecystitis   . Elevated LFTs   . Acute respiratory failure with hypoxia ([place])   . Abdominal pain 07/13/2016  . N&V (nausea and vomiting) 07/13/2016  . Sepsis ([place]) 07/13/2016  . Pleural effusion   . RUQ pain   . Anemia of renal disease 07/04/2016  . Diarrhea 07/04/2016  . Sinusitis nasal 07/03/2016  . Conjunctivitis of both eyes 07/03/2016  . 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 Protein-calorie malnutrition, moderate ([place]) 06/12/2016  . Constipation   . 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 Physical deconditioning   . Closed fracture of shaft of left femur ([place])   . Adjustment disorder with mixed anxiety and depressed mood   . Postoperative pain   . Bilateral low back pain without sciatica   . Pressure ulcer   .  Thrombocytopenia ([place])   . Arterial hypotension   . Functional diarrhea   . Anemia of chronic disease   . Multiple myeloma not having achieved remission ([place])   . Antineoplastic chemotherapy induced pancytopenia ([place])   . Low back pain   . Post-op pain   . Surgery, elective   . Acute blood loss anemia   . ESRD on dialysis ([place])   . Essential hypertension   . Urinary tract infection, site not specified   . Vitamin B12 deficiency   . Multiple myeloma ([place]) 05/14/2016  . Acute renal failure ([place])   . Pathological fracture of left femur due to neoplastic disease ([place])   . Anemia due to bone marrow failure ([place])   . 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 Occult blood positive stool 05/10/2016  . Malnutrition of moderate degree 05/10/2016    CBC    Component Value Date/Time   WBC 8.8 08/01/2016 0742   RBC 3.96 (L) 08/01/2016 0742   HGB 11.6 (L) 08/01/2016 0742   HGB 8.6 (L) 07/13/2016 0810   HCT 36.7 (L) 08/01/2016 0742   HCT 26.6 (L) 07/13/2016 0810   PLT 111 (L) 08/01/2016 0742   PLT 86 (L) 07/13/2016 0810   MCV 92.7 08/01/2016 0742   MCV 92.1 07/13/2016 0810   LYMPHSABS 2.2 07/29/2016 0831   LYMPHSABS 0.2 (L) 07/13/2016 0810   MONOABS 0.5 07/29/2016 0831   MONOABS 0.7 07/13/2016 0810   EOSABS 0.0 07/29/2016 0831   EOSABS 0.0 07/13/2016 0810   BASOSABS 0.0 07/29/2016 0831   BASOSABS 0.0 07/13/2016 0810    CMP     Component Value Date/Time   NA 135 08/01/2016 0742   NA 134 (L) 07/13/2016 0810   K 4.7 08/01/2016 0742   K 4.2 07/13/2016 0810   CL 103 08/01/2016 0742   CO2 24 08/01/2016 0742   CO2 23 07/13/2016 0810   GLUCOSE 194 (H) 08/01/2016 0742   GLUCOSE 152 (H) 07/13/2016 0810   BUN 49 (H) 08/01/2016 0742   BUN 28.6 (H) 07/13/2016 0810   CREATININE 2.45 (H) 08/01/2016 0742   CREATININE 3.3 (HH) 07/13/2016 0810   CALCIUM 8.0 (L) 08/01/2016 0742   CALCIUM 8.4 07/13/2016 0810   PROT 4.7 (L) 08/02/2016 0506   PROT 6.4 07/13/2016 0810  ALBUMIN 2.0 (L)  08/02/2016 0506   ALBUMIN 2.3 (L) 07/13/2016 0810   AST 47 (H) 08/02/2016 0506   AST 67 (H) 07/13/2016 0810   ALT 68 (H) 08/02/2016 0506   ALT 74 (H) 07/13/2016 0810   ALKPHOS 137 (H) 08/02/2016 0506   ALKPHOS 251 (H) 07/13/2016 0810   BILITOT 0.9 08/02/2016 0506   BILITOT 3.43 (H) 07/13/2016 0810   GFRNONAA 25 (L) 08/01/2016 0742   GFRAA 30 (L) 08/01/2016 0742    Assessment and Plan  NAUSEA AND VOMITING/  MUSCLE SPASMS BACK - robaxin can cause vomiting although I have never seen it before the temporal relationship is there; will d/c robaxin and since pt is sensitive to meds will try flexeril  5 mg with pt's permission to be given at 10 pm  SEASONAL ALLERGIES - HAVE WRITTEN FOR CLARITIN 10 MG DAILY   TIME SPENT > 25 MIN Anne D. Sheppard Coil, MD

## 2016-08-19 ENCOUNTER — Encounter: Payer: Self-pay | Admitting: Internal Medicine

## 2016-08-21 ENCOUNTER — Encounter: Payer: Self-pay | Admitting: Hematology and Oncology

## 2016-08-21 ENCOUNTER — Ambulatory Visit (HOSPITAL_BASED_OUTPATIENT_CLINIC_OR_DEPARTMENT_OTHER): Payer: PPO

## 2016-08-21 ENCOUNTER — Ambulatory Visit (HOSPITAL_BASED_OUTPATIENT_CLINIC_OR_DEPARTMENT_OTHER): Payer: PPO | Admitting: Hematology and Oncology

## 2016-08-21 ENCOUNTER — Other Ambulatory Visit (HOSPITAL_BASED_OUTPATIENT_CLINIC_OR_DEPARTMENT_OTHER): Payer: PPO

## 2016-08-21 ENCOUNTER — Telehealth: Payer: Self-pay | Admitting: Hematology and Oncology

## 2016-08-21 ENCOUNTER — Telehealth: Payer: Self-pay | Admitting: *Deleted

## 2016-08-21 DIAGNOSIS — C9000 Multiple myeloma not having achieved remission: Secondary | ICD-10-CM

## 2016-08-21 DIAGNOSIS — N189 Chronic kidney disease, unspecified: Secondary | ICD-10-CM

## 2016-08-21 DIAGNOSIS — K819 Cholecystitis, unspecified: Secondary | ICD-10-CM

## 2016-08-21 DIAGNOSIS — N186 End stage renal disease: Secondary | ICD-10-CM

## 2016-08-21 DIAGNOSIS — E440 Moderate protein-calorie malnutrition: Secondary | ICD-10-CM | POA: Diagnosis not present

## 2016-08-21 DIAGNOSIS — Z5112 Encounter for antineoplastic immunotherapy: Secondary | ICD-10-CM | POA: Diagnosis not present

## 2016-08-21 DIAGNOSIS — D631 Anemia in chronic kidney disease: Secondary | ICD-10-CM | POA: Diagnosis not present

## 2016-08-21 DIAGNOSIS — R5381 Other malaise: Secondary | ICD-10-CM

## 2016-08-21 DIAGNOSIS — Z992 Dependence on renal dialysis: Secondary | ICD-10-CM

## 2016-08-21 LAB — CBC WITH DIFFERENTIAL/PLATELET
BASO%: 0.9 % (ref 0.0–2.0)
Basophils Absolute: 0.1 10*3/uL (ref 0.0–0.1)
EOS ABS: 0.1 10*3/uL (ref 0.0–0.5)
EOS%: 0.9 % (ref 0.0–7.0)
HCT: 32.8 % — ABNORMAL LOW (ref 38.4–49.9)
HGB: 10.8 g/dL — ABNORMAL LOW (ref 13.0–17.1)
LYMPH%: 17.5 % (ref 14.0–49.0)
MCH: 29.8 pg (ref 27.2–33.4)
MCHC: 33.1 g/dL (ref 32.0–36.0)
MCV: 90.2 fL (ref 79.3–98.0)
MONO#: 0.6 10*3/uL (ref 0.1–0.9)
MONO%: 9 % (ref 0.0–14.0)
NEUT%: 71.7 % (ref 39.0–75.0)
NEUTROS ABS: 4.8 10*3/uL (ref 1.5–6.5)
PLATELETS: 156 10*3/uL (ref 140–400)
RBC: 3.64 10*6/uL — AB (ref 4.20–5.82)
RDW: 14.8 % — ABNORMAL HIGH (ref 11.0–14.6)
WBC: 6.7 10*3/uL (ref 4.0–10.3)
lymph#: 1.2 10*3/uL (ref 0.9–3.3)

## 2016-08-21 LAB — COMPREHENSIVE METABOLIC PANEL
ALT: 17 U/L (ref 0–55)
ANION GAP: 12 meq/L — AB (ref 3–11)
AST: 16 U/L (ref 5–34)
Albumin: 3 g/dL — ABNORMAL LOW (ref 3.5–5.0)
Alkaline Phosphatase: 121 U/L (ref 40–150)
BUN: 30.6 mg/dL — ABNORMAL HIGH (ref 7.0–26.0)
CHLORIDE: 102 meq/L (ref 98–109)
CO2: 25 meq/L (ref 22–29)
Calcium: 9.5 mg/dL (ref 8.4–10.4)
Creatinine: 2.2 mg/dL — ABNORMAL HIGH (ref 0.7–1.3)
EGFR: 29 mL/min/{1.73_m2} — AB (ref >90)
GLUCOSE: 161 mg/dL — AB (ref 70–140)
POTASSIUM: 3.8 meq/L (ref 3.5–5.1)
SODIUM: 139 meq/L (ref 136–145)
TOTAL PROTEIN: 7.9 g/dL (ref 6.4–8.3)
Total Bilirubin: 0.72 mg/dL (ref 0.20–1.20)

## 2016-08-21 MED ORDER — PROCHLORPERAZINE MALEATE 10 MG PO TABS
ORAL_TABLET | ORAL | Status: AC
  Filled 2016-08-21: qty 1

## 2016-08-21 MED ORDER — BORTEZOMIB CHEMO SQ INJECTION 3.5 MG (2.5MG/ML)
1.3000 mg/m2 | Freq: Once | INTRAMUSCULAR | Status: AC
  Administered 2016-08-21: 2.5 mg via SUBCUTANEOUS
  Filled 2016-08-21: qty 2.5

## 2016-08-21 MED ORDER — PROCHLORPERAZINE MALEATE 10 MG PO TABS
10.0000 mg | ORAL_TABLET | Freq: Once | ORAL | Status: AC
  Administered 2016-08-21: 10 mg via ORAL

## 2016-08-21 NOTE — Assessment & Plan Note (Signed)
This is likely anemia of chronic disease. The patient denies recent history of bleeding such as epistaxis, hematuria or hematochezia. He is asymptomatic from the anemia. We will observe for now.  He does not require transfusion now. He is receiving ESA through dialysis center

## 2016-08-21 NOTE — Assessment & Plan Note (Signed)
He is receiving hemodialysis in the outpatient on Tuesdays, Thursdays and Saturdays. We will adjust his treatment based on his dialysis schedule for now

## 2016-08-21 NOTE — Assessment & Plan Note (Signed)
He developed sepsis due to acute cholecystitis. The drainage tube appears clean He is getting assessment neck suite with interventional radiologist for possible removal of drainage tube. His liver function tests were within normal limits

## 2016-08-21 NOTE — Assessment & Plan Note (Signed)
He has significant physical deconditioning due to recurrent hospitalizations and surgery. He will continue physical rehabilitation

## 2016-08-21 NOTE — Telephone Encounter (Signed)
Per LOS I have scheduled appats. Notified the scheduler

## 2016-08-21 NOTE — Assessment & Plan Note (Signed)
The patient developed significant complications last month from acute cholecystitis. He has improved significantly since then and we are ready to resume treatment today. I will resume Velcade treatment on days 1, 4, 8 and 11. I will hold off Zometa as I have not obtained dental clearance. He will continue acyclovir for DVT antimicrobial prophylaxis. I will not resume Revlimid due to recent adverse pancytopenia. I recommend reducing the dose of dexamethasone to 2 mg daily and I will reassess and consider switching him back to pulse weekly dose of dexamethasone next month

## 2016-08-21 NOTE — Progress Notes (Signed)
[facility] OFFICE PROGRESS NOTE  Patient Care Team: No Pcp Per Patient as PCP - General (General Practice) Thana Ates, RN as [place], LCSW as Social Worker  SUMMARY OF ONCOLOGIC HISTORY:   Multiple myeloma not having achieved remission ([place])   05/09/2016 - 06/07/2016 Hospital Admission    The patient was hospitalized for pancytopenia, acute renal failure and back pain. He was diagnosed with multiple myeloma requiring multiple surgery and hemodialysis. He was started on chemotherapy while hospitalized      05/11/2016 Pathology Results    Accession: [id number] kidney biopsy confimed myeloma involvement      05/12/2016 Imaging    Skeletal survey showed multiple bone lesions are suggestive for metastatic bone disease and suspicious for multiple myeloma. There is imminent pathological fracture of the left femur      05/14/2016 Pathology Results    Accession: [id number] biopsy from left femur confirmed myeloma      05/16/2016 Pathology Results    Bone marrow complex cytogenetics. Please see report      05/16/2016 Imaging    MRI spine: large 6.0 x 9.0 x 7.5 cm lesion with associated extraosseous extension of tumor into the left paraspinous soft tissues at L5 with moderate to severe canal stenosis. There is L3 compression fracture      05/16/2016 Bone Marrow Biopsy    Accession: [id number] bone marrow biopsy confirmed 60% plasma cell involvement      05/18/2016 -  Chemotherapy    He was started on Velcade and dexamethasone      07/03/2016 -  Chemotherapy    Low dose Revlimid is added       INTERVAL HISTORY: Please see below for problem oriented charting. He returns for further follow-up. The patient is still residing in skilled nursing facility. His appetite is good and he felt that he might beginning weight. He denies nausea or vomiting. He had intermittent constipation alternate with diarrhea. He had recent  complication related to flushing of the drainage tube but since then has been working well. He denies recent infection. His bone pain appears stable. The patient denies any recent signs or symptoms of bleeding such as spontaneous epistaxis, hematuria or hematochezia.  REVIEW OF SYSTEMS:   Constitutional: Denies fevers, chills or abnormal weight loss Eyes: Denies blurriness of vision Ears, nose, mouth, throat, and face: Denies mucositis or sore throat Respiratory: Denies cough, dyspnea or wheezes Cardiovascular: Denies palpitation, chest discomfort or lower extremity swelling Skin: Denies abnormal skin rashes Lymphatics: Denies new lymphadenopathy or easy bruising Neurological:Denies numbness, tingling or new weaknesses Behavioral/Psych: Mood is stable, no new changes  All other systems were reviewed with the patient and are negative.  I have reviewed the past medical history, past surgical history, social history and family history with the patient and they are unchanged from previous note.  ALLERGIES:  is allergic to penicillins; dilaudid [hydromorphone hcl]; fish allergy; and codeine.  MEDICATIONS:  Current Outpatient Prescriptions  Medication Sig Dispense Refill  . acetaminophen (TYLENOL) 325 MG tablet Take 2 tablets (650 mg total) by mouth every 6 (six) hours as needed for mild pain (or Fever >/= 101). 15 tablet 0  . acyclovir (ZOVIRAX) 200 MG capsule Take 1 capsule (200 mg total) by mouth 2 (two) times daily. 60 capsule 9  . ALPRAZolam (XANAX) 0.25 MG tablet Take 1 tablet (0.25 mg total) by mouth 2 (two) times daily as needed for anxiety. 10 tablet 0  . bisacodyl (DULCOLAX)  10 MG suppository Place 1 suppository (10 mg total) rectally daily as needed for moderate constipation. 12 suppository 0  . calcium acetate (PHOSLO) 667 MG capsule Take 1 capsule (667 mg total) by mouth 2 (two) times daily with a meal. 60 capsule 1  . Cholecalciferol (VITAMIN D3) 50000 units CAPS Take 1 capsule  by mouth once a week.    . cyanocobalamin 1000 MCG tablet Take 1 tablet (1,000 mcg total) by mouth daily. 30 tablet 0  . cyclobenzaprine (FLEXERIL) 5 MG tablet Take 5 mg by mouth at bedtime.    Marland Kitchen dexamethasone (DECADRON) 4 MG tablet Take 1 tablet (4 mg total) by mouth daily.    . insulin aspart (NOVOLOG) 100 UNIT/ML injection Before each meal 3 times a day, 140-199 - 2 units, 200-250 - 4 units, 251-299 - 6 units,  300-349 - 8 units,  350 or above 10 units. Dispense syringes and needles as needed, Ok to switch to PEN if approved. Substitute to any brand approved. DX DM2, Code E11.65 1 vial 12  . loperamide (IMODIUM A-D) 2 MG tablet Take 2 mg by mouth 4 (four) times daily as needed for diarrhea or loose stools.    Marland Kitchen loratadine (CLARITIN) 10 MG tablet Take 10 mg by mouth daily as needed for allergies.     . methocarbamol (ROBAXIN) 750 MG tablet Take 750 mg by mouth at bedtime. Back muscle spasm    . midodrine (PROAMATINE) 10 MG tablet Take 1 tablet (10 mg total) by mouth 3 (three) times daily with meals.    . multivitamin (RENA-VIT) TABS tablet Take 1 tablet by mouth at bedtime. 30 tablet 0  . Nutritional Supplements (NUTRITIONAL SUPPLEMENT PO) Magic Cup twice a day for lunch and dinner.    . ondansetron (ZOFRAN) 8 MG tablet Take 1 tablet (8 mg total) by mouth every 8 (eight) hours as needed for nausea. 30 tablet 3  . oxyCODONE (OXY IR/ROXICODONE) 5 MG immediate release tablet Take 1 tablet (5 mg total) by mouth every 4 (four) hours as needed for moderate pain. 10 tablet 0  . pantoprazole (PROTONIX) 40 MG tablet Take 1 tablet (40 mg total) by mouth daily. 30 tablet 1  . polyethylene glycol (MIRALAX / GLYCOLAX) packet Take 17 g by mouth daily as needed for mild constipation or moderate constipation. 14 each 0  . protein supplement (RESOURCE BENEPROTEIN) POWD Take 6 g by mouth 3 (three) times daily with meals.  0  . triamcinolone (NASACORT AQ) 55 MCG/ACT AERO nasal inhaler Place 2 sprays into the nose  daily. 1 Inhaler 1   No current facility-administered medications for this visit.     PHYSICAL EXAMINATION: ECOG PERFORMANCE STATUS: 2 - Symptomatic, <50% confined to bed  Vitals:   08/21/16 1004  BP: 140/76  Pulse: 92  Resp: 19  Temp: 97.7 F (36.5 C)   Filed Weights   08/21/16 1004  Weight: 143 lb (64.9 kg)    GENERAL:alert, no distress and comfortable. He looks thin and cachectic SKIN: skin color, texture, turgor are normal, no rashes or significant lesions. Noted skin bruising EYES: normal, Conjunctiva are pink and non-injected, sclera clear OROPHARYNX:no exudate, no erythema and lips, buccal mucosa, and tongue normal  NECK: supple, thyroid normal size, non-tender, without nodularity LYMPH:  no palpable lymphadenopathy in the cervical, axillary or inguinal LUNGS: clear to auscultation and percussion with normal breathing effort HEART: regular rate & rhythm and no murmurs and no lower extremity edema ABDOMEN:abdomen soft, non-tender and normal bowel sounds.  The percutaneous tube is draining clear liquid Musculoskeletal:no cyanosis of digits and no clubbing  NEURO: alert & oriented x 3 with fluent speech, no focal motor/sensory deficits  LABORATORY DATA:  I have reviewed the data as listed    Component Value Date/Time   NA 139 08/21/2016 0946   K 3.8 08/21/2016 0946   CL 103 08/01/2016 0742   CO2 25 08/21/2016 0946   GLUCOSE 161 (H) 08/21/2016 0946   BUN 30.6 (H) 08/21/2016 0946   CREATININE 2.2 (H) 08/21/2016 0946   CALCIUM 9.5 08/21/2016 0946   PROT 7.9 08/21/2016 0946   ALBUMIN 3.0 (L) 08/21/2016 0946   AST 16 08/21/2016 0946   ALT 17 08/21/2016 0946   ALKPHOS 121 08/21/2016 0946   BILITOT 0.72 08/21/2016 0946   GFRNONAA 25 (L) 08/01/2016 0742   GFRAA 30 (L) 08/01/2016 0742    No results found for: SPEP, UPEP  Lab Results  Component Value Date   WBC 6.7 08/21/2016   NEUTROABS 4.8 08/21/2016   HGB 10.8 (L) 08/21/2016   HCT 32.8 (L) 08/21/2016   MCV  90.2 08/21/2016   PLT 156 08/21/2016      Chemistry      Component Value Date/Time   NA 139 08/21/2016 0946   K 3.8 08/21/2016 0946   CL 103 08/01/2016 0742   CO2 25 08/21/2016 0946   BUN 30.6 (H) 08/21/2016 0946   CREATININE 2.2 (H) 08/21/2016 0946   GLU 152 07/13/2016      Component Value Date/Time   CALCIUM 9.5 08/21/2016 0946   ALKPHOS 121 08/21/2016 0946   AST 16 08/21/2016 0946   ALT 17 08/21/2016 0946   BILITOT 0.72 08/21/2016 0946       ASSESSMENT & PLAN:  Multiple myeloma not having achieved remission ([place]) The patient developed significant complications last month from acute cholecystitis. He has improved significantly since then and we are ready to resume treatment today. I will resume Velcade treatment on days 1, 4, 8 and 11. I will hold off Zometa as I have not obtained dental clearance. He will continue acyclovir for DVT antimicrobial prophylaxis. I will not resume Revlimid due to recent adverse pancytopenia. I recommend reducing the dose of dexamethasone to 2 mg daily and I will reassess and consider switching him back to pulse weekly dose of dexamethasone next month  ESRD (end stage renal disease) on dialysis [facility]) He is receiving hemodialysis in the outpatient on Tuesdays, Thursdays and Saturdays. We will adjust his treatment based on his dialysis schedule for now  Anemia of renal disease This is likely anemia of chronic disease. The patient denies recent history of bleeding such as epistaxis, hematuria or hematochezia. He is asymptomatic from the anemia. We will observe for now.  He does not require transfusion now. He is receiving ESA through dialysis center   Protein-calorie malnutrition, moderate ([place]) He is eating better and is gaining weight. I continue to encourage him to increase oral intake as tolerated I plan to reduce dexamethasone to 2 mg daily  Physical deconditioning He has significant physical deconditioning due to recurrent  hospitalizations and surgery. He will continue physical rehabilitation  Acalculous cholecystitis He developed sepsis due to acute cholecystitis. The drainage tube appears clean He is getting assessment neck suite with interventional radiologist for possible removal of drainage tube. His liver function tests were within normal limits   No orders of the defined types were placed in this encounter.  All questions were answered. The patient knows to call  the clinic with any problems, questions or concerns. No barriers to learning was detected. I spent 25 minutes counseling the patient face to face. The total time spent in the appointment was 40 minutes and more than 50% was on counseling and review of test results     [facility], [place], MD 08/21/2016 12:36 PM

## 2016-08-21 NOTE — Assessment & Plan Note (Signed)
He is eating better and is gaining weight. I continue to encourage him to increase oral intake as tolerated I plan to reduce dexamethasone to 2 mg daily

## 2016-08-21 NOTE — Patient Instructions (Signed)
[facility] Discharge Instructions for Patients Receiving Chemotherapy  Today you received the following chemotherapy agents Velcade. To help prevent nausea and vomiting after your treatment, we encourage you to take your nausea medication as directed.  If you develop nausea and vomiting that is not controlled by your nausea medication, call the clinic.   BELOW ARE SYMPTOMS THAT SHOULD BE REPORTED IMMEDIATELY:  *FEVER GREATER THAN 100.5 F  *CHILLS WITH OR WITHOUT FEVER  NAUSEA AND VOMITING THAT IS NOT CONTROLLED WITH YOUR NAUSEA MEDICATION  *UNUSUAL SHORTNESS OF BREATH  *UNUSUAL BRUISING OR BLEEDING  TENDERNESS IN MOUTH AND THROAT WITH OR WITHOUT PRESENCE OF ULCERS  *URINARY PROBLEMS  *BOWEL PROBLEMS  UNUSUAL RASH Items with * indicate a potential emergency and should be followed up as soon as possible.  Feel free to call the clinic you have any questions or concerns. The clinic phone number is [phone].  Please show the CHEMO ALERT CARD at check-in to the Emergency Department and triage nurse.    

## 2016-08-21 NOTE — Telephone Encounter (Signed)
Message sent to chemo scheduler to add. Avs report and schd given per 08/21/16 los

## 2016-08-22 LAB — KAPPA/LAMBDA LIGHT CHAINS
IG LAMBDA FREE LIGHT CHAIN: 14.1 mg/L (ref 5.7–26.3)
KAPPA/LAMBDA FLC RATIO: 78.56 — AB (ref 0.26–1.65)

## 2016-08-23 ENCOUNTER — Telehealth: Payer: Self-pay | Admitting: Hematology and Oncology

## 2016-08-23 ENCOUNTER — Non-Acute Institutional Stay (SKILLED_NURSING_FACILITY): Payer: PPO | Admitting: Internal Medicine

## 2016-08-23 ENCOUNTER — Encounter: Payer: Self-pay | Admitting: Internal Medicine

## 2016-08-23 ENCOUNTER — Other Ambulatory Visit: Payer: Self-pay | Admitting: Hematology and Oncology

## 2016-08-23 DIAGNOSIS — N189 Chronic kidney disease, unspecified: Secondary | ICD-10-CM

## 2016-08-23 DIAGNOSIS — N186 End stage renal disease: Secondary | ICD-10-CM

## 2016-08-23 DIAGNOSIS — D61810 Antineoplastic chemotherapy induced pancytopenia: Secondary | ICD-10-CM | POA: Diagnosis not present

## 2016-08-23 DIAGNOSIS — A414 Sepsis due to anaerobes: Secondary | ICD-10-CM

## 2016-08-23 DIAGNOSIS — K219 Gastro-esophageal reflux disease without esophagitis: Secondary | ICD-10-CM

## 2016-08-23 DIAGNOSIS — D631 Anemia in chronic kidney disease: Secondary | ICD-10-CM | POA: Diagnosis not present

## 2016-08-23 DIAGNOSIS — Z992 Dependence on renal dialysis: Secondary | ICD-10-CM | POA: Diagnosis not present

## 2016-08-23 DIAGNOSIS — K819 Cholecystitis, unspecified: Secondary | ICD-10-CM | POA: Diagnosis not present

## 2016-08-23 DIAGNOSIS — E440 Moderate protein-calorie malnutrition: Secondary | ICD-10-CM | POA: Diagnosis not present

## 2016-08-23 DIAGNOSIS — R740 Nonspecific elevation of levels of transaminase and lactic acid dehydrogenase [LDH]: Secondary | ICD-10-CM | POA: Diagnosis not present

## 2016-08-23 DIAGNOSIS — R945 Abnormal results of liver function studies: Secondary | ICD-10-CM

## 2016-08-23 DIAGNOSIS — C9000 Multiple myeloma not having achieved remission: Secondary | ICD-10-CM

## 2016-08-23 DIAGNOSIS — T451X5A Adverse effect of antineoplastic and immunosuppressive drugs, initial encounter: Secondary | ICD-10-CM

## 2016-08-23 DIAGNOSIS — R7401 Elevation of levels of liver transaminase levels: Secondary | ICD-10-CM

## 2016-08-23 DIAGNOSIS — R7989 Other specified abnormal findings of blood chemistry: Secondary | ICD-10-CM | POA: Diagnosis not present

## 2016-08-23 DIAGNOSIS — E559 Vitamin D deficiency, unspecified: Secondary | ICD-10-CM | POA: Diagnosis not present

## 2016-08-23 NOTE — Progress Notes (Signed)
Location:  [place] Room Number: [place] of Service:  SNF (31)  PCP: Noah Delaine. Sheppard Coil, MD Patient Care Team: No Pcp Per Patient as PCP - General ([place]) Thana Ates, RN as [place], LCSW as Social Worker  Extended Emergency Contact Information Primary Emergency Contact: Ebner,Hilda Address: [address]          [place], [place] [address] [place] Phone: [phone] Mobile Phone: [phone] Relation: Spouse Secondary Emergency Contact: Sherrine Maples [place] Mobile Phone: [phone] Relation: Daughter  Allergies  Allergen Reactions  . Penicillins Rash and Other (See Comments)    Has patient had a PCN reaction causing immediate rash, facial/tongue/throat swelling, SOB or lightheadedness with hypotension: YES + Reaction causing SEVERE RASH involving MUCUS MEMBRANES or SKIN NECROSIS >> YES Reaction that required hospitalization: NO Reaction occurring within the last 10 years: NO If all of the above answers are "NO", then may proceed with Cephalosporin use.   . Dilaudid [Hydromorphone Hcl] Other (See Comments)    hallucinations  . Fish Allergy Nausea And Vomiting  . Codeine Other (See Comments)    "MAKES ME JUMPY"    Chief Complaint  Patient presents with  . Discharge Note    Discharged from SNF    HPI:  70 y.o. 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 On 8/3 pt developed abd pain and vomiting and presented to ED. Pt was admitted to [facility] from 8/3-23 where he was admitted to ICU for hypotension and dx with acalculus cholecystitis and sepsis 2/2 with klebsiella pneumonia.He was treated with IV antibiotics and a biliary drain. Hospital course was complicated  by acute respiratory failure, anemia and thrombocytopenia, from chemo , hyponatremia and steroid induced hyperglycemia. Pt was s admitted to SNF for generalized weakness for OT/PT and is now ready to be d/c to home.    Past Medical History:  Diagnosis Date  . Anemia    ANEMIA OF RENAL DISEASE  . Cancer ([place])   . Chronic kidney disease   . Compression fracture   . Elevated LFTs 07/2016  . Thrombocytopenia ([place]) 07/2016    Past Surgical History:  Procedure Laterality Date  . AV FISTULA PLACEMENT Left 05/24/2016   Procedure: ARTERIOVENOUS (AV) FISTULA CREATION-LEFT;  Surgeon: Conrad Lugoff, MD;  Location: [place];  Service: Vascular;  Laterality: Left;  . BONE BIOPSY Left 05/14/2016   Procedure: LEFT FEMORAL BIOPSY WITH INTRAOPERATIVE FROZEN SECTIONS ;  Surgeon: Rod Can, MD;  Location: [place];  Service: Orthopedics;  Laterality: Left;  . FEMUR IM NAIL Left 05/14/2016   Procedure: INTRAMEDULLARY (IM) NAIL FEMORAL;  Surgeon: Rod Can, MD;  Location: [place];  Service: Orthopedics;  Laterality: Left;  . IR GENERIC HISTORICAL  07/17/2016   IR PERC CHOLECYSTOSTOMY 07/17/2016 Arne Cleveland, MD [facility]-INTERV RAD  . MOUTH SURGERY    . SP CHOLECYSTOMY  07/2016   cholecystostomy drain placed under [place] and fluoro     reports that he has quit smoking. He has never used smokeless tobacco. He reports that he does not drink alcohol or use drugs. Social History   Social History  . Marital status: Married    Spouse name: N/A  . Number of  children: N/A  . Years of education: N/A   Occupational History  . Not on file.   Social History Main Topics  . Smoking status: Former Games developer  . Smokeless tobacco: Never Used  . Alcohol use No  . Drug use: No  . Sexual activity: Not on file   Other Topics Concern  . Not on file   Social History Narrative  . No narrative on file    Pertinent  Health Maintenance Due  Topic Date Due  . COLONOSCOPY  04/12/1998  . INFLUENZA VACCINE  07/18/2017 (Originally  07/11/2016)  . PNA vac Low Risk Adult (1 of 2 - PCV13) 07/18/2017 (Originally 04/12/2013)    Medications:   Medication List       Accurate as of 08/23/16 10:13 AM. Always use your most recent med list.          acetaminophen 325 MG tablet Commonly known as:  TYLENOL Take 2 tablets (650 mg total) by mouth every 6 (six) hours as needed for mild pain (or Fever >/= 101).   acyclovir 200 MG capsule Commonly known as:  ZOVIRAX Take 1 capsule (200 mg total) by mouth 2 (two) times daily.   ALPRAZolam 0.25 MG tablet Commonly known as:  XANAX Take 1 tablet (0.25 mg total) by mouth 2 (two) times daily as needed for anxiety.   bisacodyl 10 MG suppository Commonly known as:  DULCOLAX Place 1 suppository (10 mg total) rectally daily as needed for moderate constipation.   calcium acetate 667 MG capsule Commonly known as:  PHOSLO Take 1 capsule (667 mg total) by mouth 2 (two) times daily with a meal.   cyanocobalamin 1000 MCG tablet Take 1 tablet (1,000 mcg total) by mouth daily.   cyclobenzaprine 5 MG tablet Commonly known as:  FLEXERIL Take 5 mg by mouth at bedtime. 10 pm   dexamethasone 4 MG tablet Commonly known as:  DECADRON Take 1 tablet (4 mg total) by mouth daily.   insulin aspart 100 UNIT/ML injection Commonly known as:  NOVOLOG Before each meal 3 times a day, 140-199 - 2 units, 200-250 - 4 units, 251-299 - 6 units,  300-349 - 8 units,  350 or above 10 units. Dispense syringes and needles as needed, Ok to switch to PEN if approved. Substitute to any brand approved. DX DM2, Code E11.65   loperamide 2 MG tablet Commonly known as:  IMODIUM A-D Take 2 mg by mouth 4 (four) times daily as needed for diarrhea or loose stools.   loratadine 10 MG tablet Commonly known as:  CLARITIN Take 10 mg by mouth daily as needed for allergies.   midodrine 10 MG tablet Commonly known as:  PROAMATINE Take 1 tablet (10 mg total) by mouth 3 (three) times daily with meals.   multivitamin Tabs  tablet Take 1 tablet by mouth at bedtime.   NUTRITIONAL SUPPLEMENT PO Magic Cup twice a day for lunch and dinner.   ondansetron 8 MG tablet Commonly known as:  ZOFRAN Take 1 tablet (8 mg total) by mouth every 8 (eight) hours as needed for nausea.   oxyCODONE 5 MG immediate release tablet Commonly known as:  Oxy IR/ROXICODONE Take 1 tablet (5 mg total) by mouth every 4 (four) hours as needed for moderate pain.   pantoprazole 40 MG tablet Commonly known as:  PROTONIX Take 1 tablet (40 mg total) by mouth daily.   polyethylene glycol packet Commonly known as:  MIRALAX / GLYCOLAX Take 17 g by mouth daily as needed for  mild constipation or moderate constipation.   protein supplement Powd Take 6 g by mouth 3 (three) times daily with meals.   triamcinolone 55 MCG/ACT Aero nasal inhaler Commonly known as:  NASACORT AQ Place 2 sprays into the nose daily.   Vitamin D3 50000 units Caps Take 1 capsule by mouth once a week.        Vitals:   08/23/16 0934  BP: (!) 150/68  Pulse: 78  Resp: 18  Temp: 98.4 F (36.9 C)  Weight: 149 lb (67.6 kg)  Height: 6\' 2"  (1.88 m)   Body mass index is 19.13 kg/m.  Physical Exam  GENERAL APPEARANCE: Alert, conversant. No acute distress.  HEENT: Unremarkable. RESPIRATORY: Breathing is even, unlabored. Lung sounds are clear   CARDIOVASCULAR: Heart RRR no murmurs, rubs or gallops. No peripheral edema.  GASTROINTESTINAL: Abdomen is soft, non-tender, not distended w/ normal bowel sounds.; biliary drain with clear brown fluid  NEUROLOGIC: Cranial nerves 2-12 grossly intact. Moves all extremities   Labs reviewed: Basic Metabolic Panel:  Recent Labs  05/10/16 0310  07/14/16 0300 07/15/16 0222  07/19/16 0535  07/25/16 [id number]  07/27/16 [id number]  07/30/16 0534 07/31/16 0734 08/01/16 0742 08/21/16 0946  NA  --   < > 132* 128*  < > 135  < > 135  136  < > 134*  < > 136 135 135 139  K  --   < > 3.8 3.6  < > 3.8  < > 4.1  4.1  < > 4.1  < > 4.8  5.0 4.7 3.8  CL  --   < > 99* 98*  < > 101  < > 101  99*  < > 98*  < > 99* 104 103  --   CO2  --   < > 21* 20*  < > 25  < > 29  29  < > 26  < > 27 25 24 25   GLUCOSE  --   < > 198* 153*  < > 144*  < > 107*  113*  < > 276*  < > 277* 213* 194* 161*  BUN  --   < > 35* 51*  < > 52*  < > 31*  30*  < > 25*  < > 24* 38* 49* 30.6*  CREATININE  --   < > 4.08* 4.26*  < > 3.51*  < > 3.13*  3.19*  < > 2.50*  < > 2.02* 2.34* 2.45* 2.2*  CALCIUM  --   < > 7.2* 7.3*  < > 7.7*  < > 7.9*  8.1*  < > 8.0*  < > 8.0* 8.1* 8.0* 9.5  MG 1.9  --  1.2* 2.1  --   --   --   --   --   --   --   --   --   --   --   PHOS  --   < > 3.8 4.0  --  3.9  --  4.1  --  4.2  --   --   --   --   --   < > = values in this interval not displayed. No results found for: [facility] Liver Function Tests:  Recent Labs  08/01/16 0742 08/02/16 0506 08/21/16 0946  AST 101* 47* 16  ALT 92* 68* 17  ALKPHOS 145* 137* 121  BILITOT 1.1 0.9 0.72  PROT 5.0* 4.7* 7.9  ALBUMIN 2.1* 2.0* 3.0*    Recent Labs  07/13/16 0932  LIPASE 15   No results for input(s): AMMONIA in the last 8760 hours. CBC:  Recent Labs  07/28/16 0316 07/29/16 0831 07/31/16 0734 08/01/16 0742 08/21/16 0945  WBC 6.9 8.8 7.6 8.8 6.7  NEUTROABS 5.0 6.1  --   --  4.8  HGB 10.1* 10.4* 11.3* 11.6* 10.8*  HCT 32.5* 33.3* 36.5* 36.7* 32.8*  MCV 92.9 92.5 93.8 92.7 90.2  PLT 118* 97* 86* 111* 156   Lipid No results for input(s): CHOL, HDL, LDLCALC, TRIG in the last 8760 hours. Cardiac Enzymes: No results for input(s): CKTOTAL, CKMB, CKMBINDEX, TROPONINI in the last 8760 hours. BNP: No results for input(s): BNP in the last 8760 hours. CBG:  Recent Labs  08/02/16 0757 08/02/16 1131 08/02/16 1627  GLUCAP 184* 189* 223*    Procedures and Imaging Studies During Stay: Dg Abd Portable 1v  Result Date: 08/02/2016 CLINICAL DATA:  Mid abdominal pain with nausea for 6-7 hours. EXAM: PORTABLE ABDOMEN - 1 VIEW COMPARISON:  07/17/2016. FINDINGS:  Cholecystostomy tube identified in the right upper quadrant. Diffuse mild gaseous small bowel distention evident without features to suggest small bowel obstruction. No colonic dilatation. Degenerative changes noted lower lumbar spine. Dynamic hip screw identified on the left. IMPRESSION: Mild gaseous small bowel distention without evidence of obstruction. A component of mild ileus could have this appearance. Electronically Signed   By: Misty Stanley M.D.   On: 08/02/2016 09:09    Assessment/Plan:   No diagnosis found.   Patient is being discharged with the following home health services: HH/OT/PT/Nursing   Patient is being discharged with the following durable medical equipment:none  Patient has been advised to f/u with their PCP in 1-2 weeks to bring them up to date on their rehab stay.  Social services at facility was responsible for arranging this appointment.  Pt was provided with a 30 day supply of prescriptions for medications and refills must be obtained from their PCP.  For controlled substances, a more limited supply may be provided adequate until PCP appointment only.  Future labs/tests needed: f/u with ONC already arranged. F/u with surgery next week   Time spent . 30 min;> 50% of time with patient was spent reviewing records, labs, tests and studies, counseling and developing plan of care  Noah Delaine. Sheppard Coil, MD

## 2016-08-23 NOTE — Telephone Encounter (Signed)
I reviewed the recent free light chain results with the patient and his wife. Light chains were getting higher due to interruption of his treatment related to recent hospitalization. I will make no change to his treatment right now and will consider adding back Revlimid if he continues to improve in his next visit

## 2016-08-24 ENCOUNTER — Ambulatory Visit (HOSPITAL_BASED_OUTPATIENT_CLINIC_OR_DEPARTMENT_OTHER): Payer: PPO

## 2016-08-24 ENCOUNTER — Other Ambulatory Visit: Payer: PPO

## 2016-08-24 VITALS — BP 134/71 | HR 94 | Temp 97.9°F | Resp 18

## 2016-08-24 DIAGNOSIS — C9000 Multiple myeloma not having achieved remission: Secondary | ICD-10-CM

## 2016-08-24 DIAGNOSIS — Z5112 Encounter for antineoplastic immunotherapy: Secondary | ICD-10-CM

## 2016-08-24 LAB — MULTIPLE MYELOMA PANEL, SERUM
ALPHA2 GLOB SERPL ELPH-MCNC: 1 g/dL (ref 0.4–1.0)
Albumin SerPl Elph-Mcnc: 3.3 g/dL (ref 2.9–4.4)
Albumin/Glob SerPl: 0.9 (ref 0.7–1.7)
Alpha 1: 0.3 g/dL (ref 0.0–0.4)
B-GLOBULIN SERPL ELPH-MCNC: 0.7 g/dL (ref 0.7–1.3)
GLOBULIN, TOTAL: 4 g/dL — AB (ref 2.2–3.9)
Gamma Glob SerPl Elph-Mcnc: 1.9 g/dL — ABNORMAL HIGH (ref 0.4–1.8)
IGA/IMMUNOGLOBULIN A, SERUM: 33 mg/dL — AB (ref 61–437)
IgM, Qn, Serum: 28 mg/dL (ref 20–172)
M Protein SerPl Elph-Mcnc: 1.6 g/dL — ABNORMAL HIGH
TOTAL PROTEIN: 7.3 g/dL (ref 6.0–8.5)

## 2016-08-24 MED ORDER — PROCHLORPERAZINE MALEATE 10 MG PO TABS
10.0000 mg | ORAL_TABLET | Freq: Once | ORAL | Status: AC
  Administered 2016-08-24: 10 mg via ORAL

## 2016-08-24 MED ORDER — BORTEZOMIB CHEMO SQ INJECTION 3.5 MG (2.5MG/ML)
1.3000 mg/m2 | Freq: Once | INTRAMUSCULAR | Status: AC
  Administered 2016-08-24: 2.5 mg via SUBCUTANEOUS
  Filled 2016-08-24: qty 2.5

## 2016-08-24 MED ORDER — PROCHLORPERAZINE MALEATE 10 MG PO TABS
ORAL_TABLET | ORAL | Status: AC
  Filled 2016-08-24: qty 1

## 2016-08-24 NOTE — Patient Instructions (Signed)
[facility] Discharge Instructions for Patients Receiving Chemotherapy  Today you received the following chemotherapy agents Velcade. To help prevent nausea and vomiting after your treatment, we encourage you to take your nausea medication as directed.  If you develop nausea and vomiting that is not controlled by your nausea medication, call the clinic.   BELOW ARE SYMPTOMS THAT SHOULD BE REPORTED IMMEDIATELY:  *FEVER GREATER THAN 100.5 F  *CHILLS WITH OR WITHOUT FEVER  NAUSEA AND VOMITING THAT IS NOT CONTROLLED WITH YOUR NAUSEA MEDICATION  *UNUSUAL SHORTNESS OF BREATH  *UNUSUAL BRUISING OR BLEEDING  TENDERNESS IN MOUTH AND THROAT WITH OR WITHOUT PRESENCE OF ULCERS  *URINARY PROBLEMS  *BOWEL PROBLEMS  UNUSUAL RASH Items with * indicate a potential emergency and should be followed up as soon as possible.  Feel free to call the clinic you have any questions or concerns. The clinic phone number is [phone].  Please show the CHEMO ALERT CARD at check-in to the Emergency Department and triage nurse.    

## 2016-08-25 ENCOUNTER — Other Ambulatory Visit: Payer: Self-pay | Admitting: *Deleted

## 2016-08-26 ENCOUNTER — Other Ambulatory Visit: Payer: Self-pay | Admitting: *Deleted

## 2016-08-26 NOTE — Patient Outreach (Signed)
[facility]) Care Management  [facility] Social Work  08/26/2016  Josejuan Hoaglin 05-06-1946 [id number]  Subjective:   Patient released to home on 08/23/16 from [facility].    Encounter Medications:  Outpatient Encounter Prescriptions as of 08/25/2016  Medication Sig  . acetaminophen (TYLENOL) 325 MG tablet Take 2 tablets (650 mg total) by mouth every 6 (six) hours as needed for mild pain (or Fever >/= 101).  Marland Kitchen acyclovir (ZOVIRAX) 200 MG capsule Take 1 capsule (200 mg total) by mouth 2 (two) times daily.  Marland Kitchen ALPRAZolam (XANAX) 0.25 MG tablet Take 1 tablet (0.25 mg total) by mouth 2 (two) times daily as needed for anxiety.  . bisacodyl (DULCOLAX) 10 MG suppository Place 1 suppository (10 mg total) rectally daily as needed for moderate constipation.  . calcium acetate (PHOSLO) 667 MG capsule Take 1 capsule (667 mg total) by mouth 2 (two) times daily with a meal.  . Cholecalciferol (VITAMIN D3) 50000 units CAPS Take 1 capsule by mouth once a week.  . cyanocobalamin 1000 MCG tablet Take 1 tablet (1,000 mcg total) by mouth daily.  . cyclobenzaprine (FLEXERIL) 5 MG tablet Take 5 mg by mouth at bedtime. 10 pm  . dexamethasone (DECADRON) 4 MG tablet Take 1 tablet (4 mg total) by mouth daily.  . insulin aspart (NOVOLOG) 100 UNIT/ML injection Before each meal 3 times a day, 140-199 - 2 units, 200-250 - 4 units, 251-299 - 6 units,  300-349 - 8 units,  350 or above 10 units. Dispense syringes and needles as needed, Ok to switch to PEN if approved. Substitute to any brand approved. DX DM2, Code E11.65  . loperamide (IMODIUM A-D) 2 MG tablet Take 2 mg by mouth 4 (four) times daily as needed for diarrhea or loose stools.  Marland Kitchen loratadine (CLARITIN) 10 MG tablet Take 10 mg by mouth daily as needed for allergies.   . midodrine (PROAMATINE) 10 MG tablet Take 1 tablet (10 mg total) by mouth 3 (three) times daily with meals.  . multivitamin (RENA-VIT) TABS tablet Take 1 tablet by mouth at bedtime.  .  Nutritional Supplements (NUTRITIONAL SUPPLEMENT PO) Magic Cup twice a day for lunch and dinner.  . ondansetron (ZOFRAN) 8 MG tablet Take 1 tablet (8 mg total) by mouth every 8 (eight) hours as needed for nausea.  Marland Kitchen oxyCODONE (OXY IR/ROXICODONE) 5 MG immediate release tablet Take 1 tablet (5 mg total) by mouth every 4 (four) hours as needed for moderate pain.  . pantoprazole (PROTONIX) 40 MG tablet Take 1 tablet (40 mg total) by mouth daily.  . polyethylene glycol (MIRALAX / GLYCOLAX) packet Take 17 g by mouth daily as needed for mild constipation or moderate constipation.  . protein supplement (RESOURCE BENEPROTEIN) POWD Take 6 g by mouth 3 (three) times daily with meals.  . triamcinolone (NASACORT AQ) 55 MCG/ACT AERO nasal inhaler Place 2 sprays into the nose daily.   No facility-administered encounter medications on file as of 08/25/2016.     Functional Status:  In your present state of health, do you have any difficulty performing the following activities: 08/07/2016 07/18/2016  Hearing? - -  Vision? - -  Difficulty concentrating or making decisions? - -  Walking or climbing stairs? - -  Dressing or bathing? - -  Doing errands, shopping? - Y  Preparing Food and eating ? Y -  Using the Toilet? Y -  In the past six months, have you accidently leaked urine? N -  Do you have problems  with loss of bowel control? Y -  Managing your Medications? Y -  Managing your Finances? Y -  Housekeeping or managing your Housekeeping? Y -  Some recent data might be hidden    Fall/Depression Screening:  PHQ 2/9 Scores 08/07/2016 06/29/2016  PHQ - 2 Score 3 0  PHQ- 9 Score 6 -    Assessment:  Met with patient and his wife and a daughter, Larene Beach, at their home on 08/25/16.  Patient is ambulating around the home with a walker and still has a drain in place- family is tending to the drain and HH RN, PT, OT services have been arranged per wife through [facility].   CSW assessed for psychosocial barriers  and at this time none are identified or voiced by patient and family.   Patient has all his medications filled and is continuing with dialysis Tues/Thurs/Sat.  Family is very involved, supportive and also denies any concerns or needs at this time.   CSW will plan f/u call in 2-3 weeks.    [facility] CM Care Plan Problem One   Flowsheet Row Most Recent Value  Care Plan Problem One  -- [Patient admitted to [facility] for rehab.]  Role Documenting the Problem One  Clinical Social Worker  Care Plan for Problem One  Active  THN Long Term Goal (31-90 days)  Patient will participate with therapies in SNF setting to become independent and safe return home in the next 90 days.  THN Long Term Goal Start Date  08/07/16  [facility] Long Term Goal Met Date  08/23/16  Interventions for Problem One Long Term Goal  CSW discussed the importance of his daily participation with PT and OT to regain his independence with ADL's and mobility.  THN CM Short Term Goal #1 (0-30 days)  Patient will participate in daily therapies, wound care and treatment plan over the next 30 days.   THN CM Short Term Goal #1 Start Date  08/07/16  [facility] CM Short Term Goal #1 Met Date  08/23/16  Interventions for Short Term Goal #1  CSW collaborating with patient, wife and SNF staff regarding dc plan.      Eduard Clos, MSW, [place] Worker  [facility] [phone]   Plan:

## 2016-08-28 ENCOUNTER — Other Ambulatory Visit: Payer: Self-pay

## 2016-08-28 ENCOUNTER — Ambulatory Visit (HOSPITAL_BASED_OUTPATIENT_CLINIC_OR_DEPARTMENT_OTHER): Payer: PPO

## 2016-08-28 ENCOUNTER — Other Ambulatory Visit: Payer: Self-pay | Admitting: Hematology and Oncology

## 2016-08-28 ENCOUNTER — Other Ambulatory Visit (HOSPITAL_BASED_OUTPATIENT_CLINIC_OR_DEPARTMENT_OTHER): Payer: PPO

## 2016-08-28 VITALS — BP 144/72 | HR 94 | Temp 98.1°F | Resp 18

## 2016-08-28 DIAGNOSIS — Z5112 Encounter for antineoplastic immunotherapy: Secondary | ICD-10-CM

## 2016-08-28 DIAGNOSIS — C9000 Multiple myeloma not having achieved remission: Secondary | ICD-10-CM | POA: Diagnosis not present

## 2016-08-28 LAB — CBC WITH DIFFERENTIAL/PLATELET
BASO%: 0 % (ref 0.0–2.0)
BASOS ABS: 0 10*3/uL (ref 0.0–0.1)
EOS%: 0.3 % (ref 0.0–7.0)
Eosinophils Absolute: 0 10*3/uL (ref 0.0–0.5)
HEMATOCRIT: 29.5 % — AB (ref 38.4–49.9)
HGB: 9.7 g/dL — ABNORMAL LOW (ref 13.0–17.1)
LYMPH#: 1.5 10*3/uL (ref 0.9–3.3)
LYMPH%: 17.1 % (ref 14.0–49.0)
MCH: 29.7 pg (ref 27.2–33.4)
MCHC: 32.9 g/dL (ref 32.0–36.0)
MCV: 90.2 fL (ref 79.3–98.0)
MONO#: 0.7 10*3/uL (ref 0.1–0.9)
MONO%: 7.8 % (ref 0.0–14.0)
NEUT#: 6.6 10*3/uL — ABNORMAL HIGH (ref 1.5–6.5)
NEUT%: 74.8 % (ref 39.0–75.0)
PLATELETS: 90 10*3/uL — AB (ref 140–400)
RBC: 3.27 10*6/uL — ABNORMAL LOW (ref 4.20–5.82)
RDW: 14.7 % — ABNORMAL HIGH (ref 11.0–14.6)
WBC: 8.9 10*3/uL (ref 4.0–10.3)

## 2016-08-28 LAB — COMPREHENSIVE METABOLIC PANEL
ALT: 12 U/L (ref 0–55)
ANION GAP: 11 meq/L (ref 3–11)
AST: 12 U/L (ref 5–34)
Albumin: 2.6 g/dL — ABNORMAL LOW (ref 3.5–5.0)
Alkaline Phosphatase: 111 U/L (ref 40–150)
BUN: 25.7 mg/dL (ref 7.0–26.0)
CALCIUM: 9.2 mg/dL (ref 8.4–10.4)
CHLORIDE: 103 meq/L (ref 98–109)
CO2: 23 meq/L (ref 22–29)
CREATININE: 2.6 mg/dL — AB (ref 0.7–1.3)
EGFR: 25 mL/min/{1.73_m2} — ABNORMAL LOW (ref >90)
Glucose: 185 mg/dl — ABNORMAL HIGH (ref 70–140)
POTASSIUM: 4 meq/L (ref 3.5–5.1)
Sodium: 137 mEq/L (ref 136–145)
Total Bilirubin: 0.55 mg/dL (ref 0.20–1.20)
Total Protein: 7.6 g/dL (ref 6.4–8.3)

## 2016-08-28 MED ORDER — PROCHLORPERAZINE MALEATE 10 MG PO TABS
10.0000 mg | ORAL_TABLET | Freq: Once | ORAL | Status: AC
  Administered 2016-08-28: 10 mg via ORAL

## 2016-08-28 MED ORDER — PROCHLORPERAZINE MALEATE 10 MG PO TABS
ORAL_TABLET | ORAL | Status: AC
  Filled 2016-08-28: qty 1

## 2016-08-28 MED ORDER — BORTEZOMIB CHEMO SQ INJECTION 3.5 MG (2.5MG/ML)
1.3000 mg/m2 | Freq: Once | INTRAMUSCULAR | Status: AC
  Administered 2016-08-28: 2.5 mg via SUBCUTANEOUS
  Filled 2016-08-28: qty 2.5

## 2016-08-28 NOTE — Patient Outreach (Signed)
Transition of care: Placed call to patient and wife answers. (Spoke with wife who is on the consent form)Reports they just got home from [place] getting a "chemo shot". Reports that patient is doing well. Reports eating well. Wife reports that patient goes to dialysis [facility], Saturday for 4 hours at a time. Reports he has to go at 545am .  Reports that patient is drained after treatment.  Wife reports that patient came home from SNF with a bilary drain and no one told them what to do about it. Wife reports she has been emptying the drain. Reports 75 cc of drainage in the last 24 hours. Wife reports that patient will go to [facility] on Wednesday 08/30/2016 for drain evaluation.  Wife reports that she call surgeons office( Dr. Pincus Badder with [facility]) today about what she needed to do with the drain due to bloody drainage.  Wife reports that she was told to clean around the site. Wife reports home health nurse called yesterday to come but did not give patient a notice and patient was not up for the visit. Wife reports that she does not know when nurse is coming. Reports that physical therapist is coming at [place] today.   Wife reports patient does not have a primary MD and has not seen a medical doctor in "years".   Wife reports small area of skin break down on buttocks.  Wife reports patient has all medications and is taking them as prescribed.   Wife reports that patient sees Oncologist at the [facility]  Next appointment is 10/2.  Was seen on 9/11.  Dr. Redmond Pulling surgeon, Dr. Dwyane Luo at the dialysis center.( [facility]).  PLAN:  Social worker did initial home visit on 9/15.  I offered home visit on 09/05/2016 ( first available for patient due to other patient appointments) confirmed address. Provided my contact information.  Placed call to [place] home health to determine when nurse visit would occur.  [facility] CM Care Plan Problem One   Flowsheet Row Most  Recent Value  Care Plan Problem One  Recent admission for abdominal pain.  Role Documenting the Problem One  Care Management [place] for Problem One  Active  THN Long Term Goal (31-90 days)  Patient will report no readmission for the next 31 days.  THN Long Term Goal Start Date  08/28/16  Interventions for Problem One Long Term Goal  Reviewed importance of having a primary MD and following with with any problems. Placed call to home health agency. Home visit planned for 9/26  [facility] CM Short Term Goal #1 (0-30 days)  Wife and or patient will report improved healing for skin breakdown to buttocks in the next 2 weeks.  THN CM Short Term Goal #1 Start Date  08/28/16  Interventions for Short Term Goal #1  Reviewed importance of pressure relieving techniques. Reviewed importance of good nutrition.   THN CM Short Term Goal #2 (0-30 days)  Patient and or wife will report home health nurse arriving for start of care in the next 2 days.  THN CM Short Term Goal #2 Start Date  08/28/16  Interventions for Short Term Goal #2  placed call to Home health to confirm orders and plans for opening case.    Tomasa Rand, RN, BSN, CEN [facility] [employer] [phone]

## 2016-08-28 NOTE — Patient Outreach (Signed)
Care Coordination: Received a call back from [facility] home health. Spoke with Sherlynn Carbon who reports if they get a cancellation they will try to go today otherwise patient will be seen by nurse on 08/29/2016 at 12:00.  Placed call to patient to ensure they were called back and they confirmed understanding of plan.  Tomasa Rand, RN, BSN, CEN [facility] [employer] [phone]

## 2016-08-28 NOTE — Patient Instructions (Signed)
[facility] Discharge Instructions for Patients Receiving Chemotherapy  Today you received the following chemotherapy agents Velcade. To help prevent nausea and vomiting after your treatment, we encourage you to take your nausea medication as directed.  If you develop nausea and vomiting that is not controlled by your nausea medication, call the clinic.   BELOW ARE SYMPTOMS THAT SHOULD BE REPORTED IMMEDIATELY:  *FEVER GREATER THAN 100.5 F  *CHILLS WITH OR WITHOUT FEVER  NAUSEA AND VOMITING THAT IS NOT CONTROLLED WITH YOUR NAUSEA MEDICATION  *UNUSUAL SHORTNESS OF BREATH  *UNUSUAL BRUISING OR BLEEDING  TENDERNESS IN MOUTH AND THROAT WITH OR WITHOUT PRESENCE OF ULCERS  *URINARY PROBLEMS  *BOWEL PROBLEMS  UNUSUAL RASH Items with * indicate a potential emergency and should be followed up as soon as possible.  Feel free to call the clinic you have any questions or concerns. The clinic phone number is [phone].  Please show the CHEMO ALERT CARD at check-in to the Emergency Department and triage nurse.    

## 2016-08-29 ENCOUNTER — Other Ambulatory Visit: Payer: PPO

## 2016-08-30 ENCOUNTER — Ambulatory Visit (HOSPITAL_COMMUNITY)
Admission: RE | Admit: 2016-08-30 | Discharge: 2016-08-30 | Disposition: A | Discharge status: Home / Self Care | Payer: PPO | Source: Ambulatory Visit | Attending: General Surgery | Admitting: General Surgery

## 2016-08-30 ENCOUNTER — Encounter (HOSPITAL_COMMUNITY): Payer: Self-pay | Admitting: Interventional Radiology

## 2016-08-30 ENCOUNTER — Other Ambulatory Visit: Payer: PPO

## 2016-08-30 ENCOUNTER — Telehealth: Payer: Self-pay | Admitting: *Deleted

## 2016-08-30 DIAGNOSIS — Y828 Other medical devices associated with adverse incidents: Secondary | ICD-10-CM | POA: Insufficient documentation

## 2016-08-30 DIAGNOSIS — C9000 Multiple myeloma not having achieved remission: Secondary | ICD-10-CM

## 2016-08-30 DIAGNOSIS — K8010 Calculus of gallbladder with chronic cholecystitis without obstruction: Secondary | ICD-10-CM

## 2016-08-30 DIAGNOSIS — T85698A Other mechanical complication of other specified internal prosthetic devices, implants and grafts, initial encounter: Secondary | ICD-10-CM | POA: Insufficient documentation

## 2016-08-30 DIAGNOSIS — K819 Cholecystitis, unspecified: Secondary | ICD-10-CM

## 2016-08-30 HISTORY — PX: IR GENERIC HISTORICAL: IMG1180011

## 2016-08-30 MED ORDER — IOPAMIDOL (ISOVUE-300) INJECTION 61%
50.0000 mL | Freq: Once | INTRAVENOUS | Status: AC | PRN
  Administered 2016-08-30: 20 mL via INTRAVENOUS

## 2016-08-30 NOTE — Telephone Encounter (Signed)
Home Health RN,  Dierdre Harness called to ask if Dr. Alvy Bimler will sign home care orders?  Pt was d/c'd home from facility and does not have PCP to sign orders.   Dr. Alvy Bimler agreed to provide home care orders.  Notified Kim and she will fax orders to our office.  Nurse plans on visiting twice weekly.  She is also going to arrange for PHT in home and I told her Dr. Alvy Bimler will also sign PHT orders if needed.  Maudie Mercury says pt was seen in IR today for his Biliary Drain.  Pt has to keep drain for now.  Family was instructed on care of drain.

## 2016-08-30 NOTE — Procedures (Signed)
Cholecystomy tube injection  Distal CBD obstruction , ? [place]  No comp Keep to [facility] report in pacs D/w Dr Redmond Pulling

## 2016-08-31 ENCOUNTER — Emergency Department (HOSPITAL_COMMUNITY): Payer: PPO

## 2016-08-31 ENCOUNTER — Encounter (HOSPITAL_COMMUNITY): Payer: Self-pay | Admitting: Emergency Medicine

## 2016-08-31 ENCOUNTER — Other Ambulatory Visit: Payer: Self-pay | Admitting: Hematology and Oncology

## 2016-08-31 ENCOUNTER — Inpatient Hospital Stay (HOSPITAL_COMMUNITY)
Admission: EM | Admit: 2016-08-31 | Discharge: 2016-09-03 | DRG: 193 | Disposition: A | Discharge status: Home / Self Care | Payer: PPO | Attending: Internal Medicine | Admitting: Internal Medicine

## 2016-08-31 ENCOUNTER — Ambulatory Visit (HOSPITAL_BASED_OUTPATIENT_CLINIC_OR_DEPARTMENT_OTHER): Payer: PPO

## 2016-08-31 ENCOUNTER — Other Ambulatory Visit: Payer: PPO

## 2016-08-31 VITALS — BP 121/91 | HR 99 | Temp 98.2°F | Resp 18

## 2016-08-31 DIAGNOSIS — D6959 Other secondary thrombocytopenia: Secondary | ICD-10-CM | POA: Diagnosis present

## 2016-08-31 DIAGNOSIS — Z7952 Long term (current) use of systemic steroids: Secondary | ICD-10-CM

## 2016-08-31 DIAGNOSIS — Z888 Allergy status to other drugs, medicaments and biological substances status: Secondary | ICD-10-CM

## 2016-08-31 DIAGNOSIS — Z794 Long term (current) use of insulin: Secondary | ICD-10-CM

## 2016-08-31 DIAGNOSIS — Z9221 Personal history of antineoplastic chemotherapy: Secondary | ICD-10-CM

## 2016-08-31 DIAGNOSIS — R739 Hyperglycemia, unspecified: Secondary | ICD-10-CM | POA: Diagnosis present

## 2016-08-31 DIAGNOSIS — C9000 Multiple myeloma not having achieved remission: Secondary | ICD-10-CM

## 2016-08-31 DIAGNOSIS — Z5112 Encounter for antineoplastic immunotherapy: Secondary | ICD-10-CM

## 2016-08-31 DIAGNOSIS — Z885 Allergy status to narcotic agent status: Secondary | ICD-10-CM

## 2016-08-31 DIAGNOSIS — R1011 Right upper quadrant pain: Secondary | ICD-10-CM | POA: Diagnosis not present

## 2016-08-31 DIAGNOSIS — N2581 Secondary hyperparathyroidism of renal origin: Secondary | ICD-10-CM | POA: Diagnosis present

## 2016-08-31 DIAGNOSIS — K831 Obstruction of bile duct: Secondary | ICD-10-CM | POA: Diagnosis present

## 2016-08-31 DIAGNOSIS — Z992 Dependence on renal dialysis: Secondary | ICD-10-CM

## 2016-08-31 DIAGNOSIS — Z833 Family history of diabetes mellitus: Secondary | ICD-10-CM

## 2016-08-31 DIAGNOSIS — Z880 Allergy status to penicillin: Secondary | ICD-10-CM

## 2016-08-31 DIAGNOSIS — D696 Thrombocytopenia, unspecified: Secondary | ICD-10-CM | POA: Diagnosis present

## 2016-08-31 DIAGNOSIS — Z79899 Other long term (current) drug therapy: Secondary | ICD-10-CM

## 2016-08-31 DIAGNOSIS — Y95 Nosocomial condition: Secondary | ICD-10-CM | POA: Diagnosis present

## 2016-08-31 DIAGNOSIS — T380X5A Adverse effect of glucocorticoids and synthetic analogues, initial encounter: Secondary | ICD-10-CM | POA: Diagnosis present

## 2016-08-31 DIAGNOSIS — R197 Diarrhea, unspecified: Secondary | ICD-10-CM | POA: Diagnosis present

## 2016-08-31 DIAGNOSIS — N189 Chronic kidney disease, unspecified: Secondary | ICD-10-CM

## 2016-08-31 DIAGNOSIS — J189 Pneumonia, unspecified organism: Principal | ICD-10-CM | POA: Diagnosis present

## 2016-08-31 DIAGNOSIS — N186 End stage renal disease: Secondary | ICD-10-CM | POA: Diagnosis present

## 2016-08-31 DIAGNOSIS — I120 Hypertensive chronic kidney disease with stage 5 chronic kidney disease or end stage renal disease: Secondary | ICD-10-CM | POA: Diagnosis present

## 2016-08-31 DIAGNOSIS — D631 Anemia in chronic kidney disease: Secondary | ICD-10-CM | POA: Diagnosis present

## 2016-08-31 LAB — COMPREHENSIVE METABOLIC PANEL
ALT: 16 U/L — ABNORMAL LOW (ref 17–63)
AST: 21 U/L (ref 15–41)
Albumin: 2.8 g/dL — ABNORMAL LOW (ref 3.5–5.0)
Alkaline Phosphatase: 113 U/L (ref 38–126)
Anion gap: 10 (ref 5–15)
BUN: 12 mg/dL (ref 6–20)
CHLORIDE: 98 mmol/L — AB (ref 101–111)
CO2: 25 mmol/L (ref 22–32)
CREATININE: 1.73 mg/dL — AB (ref 0.61–1.24)
Calcium: 8.9 mg/dL (ref 8.9–10.3)
GFR, EST AFRICAN AMERICAN: 45 mL/min — AB (ref >60)
GFR, EST NON AFRICAN AMERICAN: 39 mL/min — AB (ref >60)
Glucose, Bld: 176 mg/dL — ABNORMAL HIGH (ref 65–99)
POTASSIUM: 4.2 mmol/L (ref 3.5–5.1)
Sodium: 133 mmol/L — ABNORMAL LOW (ref 135–145)
Total Bilirubin: 0.5 mg/dL (ref 0.3–1.2)
Total Protein: 7.3 g/dL (ref 6.5–8.1)

## 2016-08-31 LAB — I-STAT CG4 LACTIC ACID, ED: LACTIC ACID, VENOUS: 3.3 mmol/L — AB (ref 0.5–1.9)

## 2016-08-31 LAB — CBC WITH DIFFERENTIAL/PLATELET
BASOS ABS: 0 10*3/uL (ref 0.0–0.1)
BASOS PCT: 0 %
EOS ABS: 0 10*3/uL (ref 0.0–0.7)
Eosinophils Relative: 0 %
HEMATOCRIT: 30.3 % — AB (ref 39.0–52.0)
HEMOGLOBIN: 9.4 g/dL — AB (ref 13.0–17.0)
Lymphocytes Relative: 9 %
Lymphs Abs: 1.2 10*3/uL (ref 0.7–4.0)
MCH: 28.9 pg (ref 26.0–34.0)
MCHC: 31 g/dL (ref 30.0–36.0)
MCV: 93.2 fL (ref 78.0–100.0)
MONO ABS: 1.2 10*3/uL — AB (ref 0.1–1.0)
Monocytes Relative: 9 %
NEUTROS ABS: 11 10*3/uL — AB (ref 1.7–7.7)
Neutrophils Relative %: 82 %
Platelets: 78 10*3/uL — ABNORMAL LOW (ref 150–400)
RBC: 3.25 MIL/uL — ABNORMAL LOW (ref 4.22–5.81)
RDW: 14.8 % (ref 11.5–15.5)
WBC: 13.3 10*3/uL — ABNORMAL HIGH (ref 4.0–10.5)

## 2016-08-31 MED ORDER — PROCHLORPERAZINE MALEATE 10 MG PO TABS
ORAL_TABLET | ORAL | Status: AC
  Filled 2016-08-31: qty 1

## 2016-08-31 MED ORDER — PROCHLORPERAZINE MALEATE 10 MG PO TABS
10.0000 mg | ORAL_TABLET | Freq: Once | ORAL | Status: AC
  Administered 2016-08-31: 10 mg via ORAL

## 2016-08-31 MED ORDER — SODIUM CHLORIDE 0.9 % IV BOLUS (SEPSIS)
1000.0000 mL | Freq: Once | INTRAVENOUS | Status: AC
  Administered 2016-08-31: 1000 mL via INTRAVENOUS

## 2016-08-31 MED ORDER — BORTEZOMIB CHEMO SQ INJECTION 3.5 MG (2.5MG/ML)
1.3000 mg/m2 | Freq: Once | INTRAMUSCULAR | Status: AC
  Administered 2016-08-31: 2.5 mg via SUBCUTANEOUS
  Filled 2016-08-31: qty 2.5

## 2016-08-31 NOTE — Patient Instructions (Signed)
[facility] Discharge Instructions for Patients Receiving Chemotherapy  Today you received the following chemotherapy agents Velcade. To help prevent nausea and vomiting after your treatment, we encourage you to take your nausea medication as directed.  If you develop nausea and vomiting that is not controlled by your nausea medication, call the clinic.   BELOW ARE SYMPTOMS THAT SHOULD BE REPORTED IMMEDIATELY:  *FEVER GREATER THAN 100.5 F  *CHILLS WITH OR WITHOUT FEVER  NAUSEA AND VOMITING THAT IS NOT CONTROLLED WITH YOUR NAUSEA MEDICATION  *UNUSUAL SHORTNESS OF BREATH  *UNUSUAL BRUISING OR BLEEDING  TENDERNESS IN MOUTH AND THROAT WITH OR WITHOUT PRESENCE OF ULCERS  *URINARY PROBLEMS  *BOWEL PROBLEMS  UNUSUAL RASH Items with * indicate a potential emergency and should be followed up as soon as possible.  Feel free to call the clinic you have any questions or concerns. The clinic phone number is [phone].  Please show the CHEMO ALERT CARD at check-in to the Emergency Department and triage nurse.    

## 2016-08-31 NOTE — ED Notes (Addendum)
Pt reports that he had a fever at home earlier today. Pt reports that he received chemotherapy today as well.  Pt reports chills with his fever but no symptoms at this time.  Pt reports that he "feels fine now" and says "tell that doctor I am ready to go home."   Chief Complaint  Patient presents with  . Chemo Card  . Fever   Past Medical History:  Diagnosis Date  . Anemia    ANEMIA OF RENAL DISEASE  . Cancer ([place])   . Chronic kidney disease   . Compression fracture   . Elevated LFTs 07/2016  . Thrombocytopenia ([place]) 07/2016

## 2016-08-31 NOTE — ED Notes (Signed)
Patient transported to [place] at this time via ED stretcher. Pt in no apparent distress at this time.

## 2016-08-31 NOTE — ED Provider Notes (Signed)
[facility] DEPT Provider Note   CSN: [id number] Arrival date & time: 08/31/16  2153  By signing my name below, I, Maud Deed. Royston Sinner, attest that this documentation has been prepared under the direction and in the presence of Everlene Balls, MD.  Electronically Signed: Maud Deed. Royston Sinner, ED Scribe. 08/31/16. 11:25 PM.    History   Chief Complaint Chief Complaint  Patient presents with  . Chemo Card  . Fever   The history is provided by the patient. No language interpreter was used.    HPI Comments: Thomas [name] is a 70 y.o. male with a PMHx of chronic kidney disease and multiple myeloma currently on chemo, who presents to the Emergency Department here for a persistent fever of 100.3 which began earlier this afternoon. Pt also reports associated cold chills and a "shaking episode" which occurred at 7:30 PM this evening. Ongoing productive cough consisting of white mucous x 1 month also reported. No OTC/prescribed medications or home remedies attempted prior to arrival. Denies any recent nausea, vomiting, or diarrhea. Last chemotherapy injection earlier today. Pt had a biliary drain placed on 07/13/16. Family states they have noted drainage and blood in the last several days. Pt states surrounded area is tender to touch described as burning. Pt typically dialyzes on Tuesdays, Thursdays, and Saturdays. Last full treatment earlier this morning.  PCP: No PCP Per Patient    Past Medical History:  Diagnosis Date  . Anemia    ANEMIA OF RENAL DISEASE  . Cancer ([place])   . Chronic kidney disease   . Compression fracture   . Elevated LFTs 07/2016  . Thrombocytopenia ([place]) 07/2016    Patient Active Problem List   Diagnosis Date Noted  . HCAP (healthcare-associated pneumonia) 09/01/2016  . Klebsiella pneumoniae sepsis ([place]) 08/05/2016  . Transaminitis 08/05/2016  . Hyponatremia 08/05/2016  . GERD (gastroesophageal reflux disease) 08/05/2016  . Vitamin D deficiency 08/05/2016  . Pain   .  Labile blood pressure   . Labile blood glucose   . Steroid-induced hyperglycemia   . Acalculous cholecystitis   . Elevated LFTs   . Acute respiratory failure with hypoxia ([place])   . Abdominal pain 07/13/2016  . N&V (nausea and vomiting) 07/13/2016  . Sepsis ([place]) 07/13/2016  . Pleural effusion   . RUQ pain   . Anemia of renal disease 07/04/2016  . Diarrhea 07/04/2016  . Sinusitis nasal 07/03/2016  . Conjunctivitis of both eyes 07/03/2016  . 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 Protein-calorie malnutrition, moderate ([place]) 06/12/2016  . Constipation   . 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 Physical deconditioning   . Closed fracture of shaft of left femur ([place])   . Adjustment disorder with mixed anxiety and depressed mood   . Postoperative pain   . Bilateral low back pain without sciatica   . Pressure ulcer   . Thrombocytopenia ([place])   . Arterial hypotension   . Functional diarrhea   . Anemia of chronic disease   . Multiple myeloma not having achieved remission ([place])   . Antineoplastic chemotherapy induced pancytopenia ([place])   . Low back pain   . Post-op pain   . Surgery, elective   . Acute blood loss anemia   . ESRD on dialysis ([place])   . Essential hypertension   . Urinary tract infection, site not specified   . Vitamin B12 deficiency   . Multiple myeloma ([place]) 05/14/2016  . Acute renal failure ([place])   . Pathological fracture of  left femur due to neoplastic disease ([place])   . Anemia due to bone marrow failure ([place])   . 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 Occult blood positive stool 05/10/2016  . Malnutrition of moderate degree 05/10/2016    Past Surgical History:  Procedure Laterality Date  . AV FISTULA PLACEMENT Left 05/24/2016   Procedure: ARTERIOVENOUS (AV) FISTULA CREATION-LEFT;  Surgeon: Conrad Marion, MD;  Location: [facility];  Service: Vascular;  Laterality: Left;  . BONE BIOPSY Left 05/14/2016   Procedure: LEFT FEMORAL BIOPSY WITH  INTRAOPERATIVE FROZEN SECTIONS ;  Surgeon: Rod Can, MD;  Location: [facility];  Service: Orthopedics;  Laterality: Left;  . FEMUR IM NAIL Left 05/14/2016   Procedure: INTRAMEDULLARY (IM) NAIL FEMORAL;  Surgeon: Rod Can, MD;  Location: [facility];  Service: Orthopedics;  Laterality: Left;  . IR GENERIC HISTORICAL  07/17/2016   IR PERC CHOLECYSTOSTOMY 07/17/2016 Arne Cleveland, MD [facility]-INTERV RAD  . IR GENERIC HISTORICAL  08/30/2016   IR SINUS/FIST TUBE CHK-NON GI 08/30/2016 Greggory Keen, MD [facility]-INTERV RAD  . MOUTH SURGERY    . SP CHOLECYSTOMY  07/2016   cholecystostomy drain placed under [place] and fluoro       Home Medications    Prior to Admission medications   Medication Sig Start Date End Date Taking? Authorizing Provider  acyclovir (ZOVIRAX) 200 MG capsule Take 1 capsule (200 mg total) by mouth 2 (two) times daily. 07/03/16  Yes Heath Lark, MD  calcium acetate (PHOSLO) 667 MG capsule Take 1 capsule (667 mg total) by mouth 2 (two) times daily with a meal. 06/08/16  Yes Daniel J Angiulli, PA-C  Cholecalciferol (VITAMIN D3) 50000 units CAPS Take 1 capsule by mouth once a week.   Yes Historical Provider, MD  cyanocobalamin 1000 MCG tablet Take 1 tablet (1,000 mcg total) by mouth daily. 06/08/16  Yes Daniel J Angiulli, PA-C  dexamethasone (DECADRON) 2 MG tablet Take 2 mg by mouth daily.   Yes Historical Provider, MD  insulin aspart (NOVOLOG) 100 UNIT/ML injection Before each meal 3 times a day, 140-199 - 2 units, 200-250 - 4 units, 251-299 - 6 units,  300-349 - 8 units,  350 or above 10 units. Dispense syringes and needles as needed, Ok to switch to PEN if approved. Substitute to any brand approved. DX DM2, Code E11.65 08/02/16  Yes Thurnell Lose, MD  loratadine (CLARITIN) 10 MG tablet Take 10 mg by mouth daily.    Yes Historical Provider, MD  midodrine (PROAMATINE) 10 MG tablet Take 1 tablet (10 mg total) by mouth 3 (three) times daily with meals. 08/02/16  Yes Thurnell Lose, MD  multivitamin  (RENA-VIT) TABS tablet Take 1 tablet by mouth at bedtime. 06/08/16  Yes Daniel J Angiulli, PA-C  pantoprazole (PROTONIX) 40 MG tablet Take 1 tablet (40 mg total) by mouth daily. 06/08/16  Yes Daniel J Angiulli, PA-C  triamcinolone (NASACORT AQ) 55 MCG/ACT AERO nasal inhaler Place 2 sprays into the nose daily. 07/03/16  Yes Heath Lark, MD  acetaminophen (TYLENOL) 325 MG tablet Take 2 tablets (650 mg total) by mouth every 6 (six) hours as needed for mild pain (or Fever >/= 101). Patient not taking: Reported on 08/31/2016 05/29/16   Flonnie Overman Dhungel, MD  ALPRAZolam Duanne Moron) 0.25 MG tablet Take 1 tablet (0.25 mg total) by mouth 2 (two) times daily as needed for anxiety. Patient not taking: Reported on 08/31/2016 08/02/16   Thurnell Lose, MD  bisacodyl (DULCOLAX) 10 MG suppository Place 1 suppository (10  mg total) rectally daily as needed for moderate constipation. Patient not taking: Reported on 08/31/2016 08/02/16   Thurnell Lose, MD  dexamethasone (DECADRON) 4 MG tablet Take 1 tablet (4 mg total) by mouth daily. Patient not taking: Reported on 08/31/2016 08/02/16   Thurnell Lose, MD  ondansetron (ZOFRAN) 8 MG tablet Take 1 tablet (8 mg total) by mouth every 8 (eight) hours as needed for nausea. Patient not taking: Reported on 08/31/2016 07/03/16   Heath Lark, MD  oxyCODONE (OXY IR/ROXICODONE) 5 MG immediate release tablet Take 1 tablet (5 mg total) by mouth every 4 (four) hours as needed for moderate pain. Patient not taking: Reported on 08/31/2016 08/02/16   Thurnell Lose, MD  polyethylene glycol [facility] Floria Raveling) packet Take 17 g by mouth daily as needed for mild constipation or moderate constipation. Patient not taking: Reported on 08/31/2016 08/02/16   Thurnell Lose, MD  protein supplement (RESOURCE BENEPROTEIN) POWD Take 6 g by mouth 3 (three) times daily with meals. Patient not taking: Reported on 08/31/2016 08/02/16   Thurnell Lose, MD    Family History Family History  Problem Relation  Age of Onset  . Diabetes Father     Social History Social History  Substance Use Topics  . Smoking status: Former Research scientist (life sciences)  . Smokeless tobacco: Never Used  . Alcohol use No     Allergies   Penicillins; Dilaudid [hydromorphone hcl]; Fish allergy; and Codeine   Review of Systems Review of Systems  Constitutional: Positive for chills and fever.  Respiratory: Positive for cough.   Cardiovascular: Negative for chest pain.  Gastrointestinal: Negative for diarrhea, nausea and vomiting.  Neurological: Negative for headaches.  Psychiatric/Behavioral: Negative for confusion.  All other systems reviewed and are negative.    Physical Exam Updated Vital Signs BP (!) 129/107   Pulse 112   Temp 97.7 F (36.5 C) (Oral)   Resp 18   Ht 6' 2"  (1.88 m)   Wt 141 lb (64 kg)   SpO2 97%   BMI 18.10 kg/m   Physical Exam  Constitutional: He is oriented to person, place, and time. Vital signs are normal. He appears well-developed and well-nourished.  Non-toxic appearance. He does not appear ill. No distress.  HENT:  Head: Normocephalic and atraumatic.  Nose: Nose normal.  Mouth/Throat: Oropharynx is clear and moist. No oropharyngeal exudate.  Eyes: Conjunctivae and EOM are normal. Pupils are equal, round, and reactive to light. No scleral icterus.  Neck: Normal range of motion. Neck supple. No tracheal deviation, no edema, no erythema and normal range of motion present. No thyroid mass and no thyromegaly present.  Cardiovascular: Regular rhythm, S1 normal, S2 normal, intact distal pulses and normal pulses.  Tachycardia present.  Exam reveals no gallop and no friction rub.   Murmur heard. Systolic murmur noted  Pulmonary/Chest: Effort normal and breath sounds normal. No respiratory distress. He has no wheezes. He has no rhonchi. He has no rales.  HD catheter to R chest wall.  Abdominal: Soft. Normal appearance and bowel sounds are normal. He exhibits no distension, no ascites and no mass.  There is no hepatosplenomegaly. There is no tenderness. There is no rebound, no guarding and no CVA tenderness.  Biliary drain in RUQ with green output. The site is slightly bloody without any erythema or tenderness.  Musculoskeletal: Normal range of motion. He exhibits no edema or tenderness.  LUE with AVF with palpable thrill  Lymphadenopathy:    He has no cervical adenopathy.  Neurological: He is alert and oriented to person, place, and time. He has normal strength. No cranial nerve deficit or sensory deficit.  Skin: Skin is warm, dry and intact. No petechiae and no rash noted. He is not diaphoretic. No erythema. No pallor.  Nursing note and vitals reviewed.    ED Treatments / Results   DIAGNOSTIC STUDIES: Oxygen Saturation is 97% on RA, adequate by my interpretation.    COORDINATION OF CARE: 11:18 PM- Will order CXR and blood work. Discussed treatment plan with pt at bedside and pt agreed to plan.      Labs (all labs ordered are listed, but only abnormal results are displayed) Labs Reviewed  COMPREHENSIVE METABOLIC PANEL - Abnormal; Notable for the following:       Result Value   Sodium 133 (*)    Chloride 98 (*)    Glucose, Bld 176 (*)    Creatinine, Ser 1.73 (*)    Albumin 2.8 (*)    ALT 16 (*)    GFR calc non Af Amer 39 (*)    GFR calc Af Amer 45 (*)    All other components within normal limits  CBC WITH DIFFERENTIAL/PLATELET - Abnormal; Notable for the following:    WBC 13.3 (*)    RBC 3.25 (*)    Hemoglobin 9.4 (*)    HCT 30.3 (*)    Platelets 78 (*)    Neutro Abs 11.0 (*)    Monocytes Absolute 1.2 (*)    All other components within normal limits  I-STAT CG4 LACTIC ACID, ED - Abnormal; Notable for the following:    Lactic Acid, Venous 3.30 (*)    All other components within normal limits  CULTURE, BLOOD (ROUTINE X 2)  CULTURE, BLOOD (ROUTINE X 2)  URINE CULTURE  URINALYSIS, ROUTINE W REFLEX MICROSCOPIC (NOT AT [facility])  I-STAT CG4 LACTIC ACID, ED    EKG   EKG Interpretation None       Radiology Dg Chest 2 View  Result Date: 08/31/2016 CLINICAL DATA:  Fever today. Cough for 1 month. Chemotherapy patient. Nonsmoker. EXAM: CHEST  2 VIEW COMPARISON:  07/13/2016 FINDINGS: Right central venous catheter with tip over the cavoatrial junction region. No pneumothorax. Slightly shallow inspiration. Normal heart size and pulmonary vascularity. Emphysematous changes in the lungs. Increased density in the left lung base posteriorly likely representing left lower lobe collapse. This could be caused by pneumonia or endobronchial lesion. Pleural effusions have improved since previous study but remain present bilaterally. Calcification of the aorta. Degenerative changes in the spine. IMPRESSION: Left lower lobe collapse. Can't exclude pneumonia or endobronchial lesion. Emphysematous changes in the lungs. Electronically Signed   By: Lucienne Capers M.D.   On: 08/31/2016 23:14   Ir Sinus/fist Tube Chk-non Gi  Result Date: 08/30/2016 CLINICAL DATA:  Recent a calculus cholecystitis, recent sepsis, multiple myeloma EXAM: SINUS TRACT INJECTION/FISTULOGRAM ANESTHESIA/SEDATION: None. MEDICATIONS: None. CONTRAST:  57m ISOVUE-300 IOPAMIDOL (ISOVUE-300) INJECTION 61% PROCEDURE: The existing cholecystostomy catheter was injected with contrast. Fluoroscopic imaging performed. COMPLICATIONS: [id number]/[id number]: Contrast injection performed of the cholecystostomy. Catheter is within the collapsed gallbladder. Mixing artifact within the gallbladder compatible with gallbladder sludge. Cystic duct is patent. There is opacification of the intrahepatic ducts, biliary confluence, common hepatic duct. Common bile duct has an abrupt obstruction distally. Distal impacted choledocholithiasis not excluded. Further distention of the biliary system continues to demonstrate obstruction. At this point, the patient was uncomfortable and the injection was stopped. IMPRESSION: Abrupt distal CBD  obstruction, suspicious for impacted  choledocholithiasis versus underlying obstructing lesion or mass. These results were called by telephone at the time of interpretation on 08/30/2016 at 12:30pm to Dr. Greer Pickerel , who verbally acknowledged these results. Electronically Signed   By: Jerilynn Mages.  Shick M.D.   On: 08/30/2016 14:27    Procedures Procedures (including critical care time)  Medications Ordered in ED Medications  vancomycin (VANCOCIN) 1,500 mg in sodium chloride 0.9 % 500 mL IVPB (not administered)  cefTRIAXone (ROCEPHIN) 2 g in dextrose 5 % 50 mL IVPB (not administered)  sodium chloride 0.9 % bolus 1,000 mL (0 mLs Intravenous Stopped 09/01/16 0118)     Initial Impression / Assessment and Plan / ED Course  I have reviewed the triage vital signs and the nursing notes.  Pertinent labs & imaging results that were available during my care of the patient were reviewed by me and considered in my medical decision making (see chart for details).  Clinical Course    Patient presents to the ED for rigors, chills, and temp of 100.3.  In the setting of chemo, there is concern for infection.  Work up was initiated in the ED.  Patient tachycardic as well, given IVF.  Will recheck gb area as well as there is a drain in right now.  No outward signs of infection.  RUQ [place] pending.  No fever in the ED.  Will continue to closely monitor.  2:43 AM RUQ [place] neg.  CXR shows possible pneumonia, consistent with fever, cough, and rigors.  He was given vancomycin and cefepime for treatment. Dr. Hal Hope accepts for further care.  Final Clinical Impressions(s) / ED Diagnoses   Final diagnoses:  RUQ abdominal pain  HCAP (healthcare-associated pneumonia)    New Prescriptions New Prescriptions   No medications on file   I personally performed the services described in this documentation, which was scribed in my presence. The recorded information has been reviewed and is accurate.      Everlene Balls,  MD 09/01/16 [phone]

## 2016-08-31 NOTE — ED Triage Notes (Signed)
Pt is here with subjective fever of 100.3 at home. Pt is chemo patient and should be on neutropenic precautions per his "chemo card" No rectal temps on pt. Pt also reports cough x 1 month.

## 2016-09-01 ENCOUNTER — Telehealth: Payer: Self-pay

## 2016-09-01 ENCOUNTER — Encounter (HOSPITAL_COMMUNITY): Payer: Self-pay | Admitting: Internal Medicine

## 2016-09-01 DIAGNOSIS — Z794 Long term (current) use of insulin: Secondary | ICD-10-CM | POA: Diagnosis not present

## 2016-09-01 DIAGNOSIS — Z79899 Other long term (current) drug therapy: Secondary | ICD-10-CM | POA: Diagnosis not present

## 2016-09-01 DIAGNOSIS — N186 End stage renal disease: Secondary | ICD-10-CM

## 2016-09-01 DIAGNOSIS — D6959 Other secondary thrombocytopenia: Secondary | ICD-10-CM | POA: Diagnosis present

## 2016-09-01 DIAGNOSIS — I120 Hypertensive chronic kidney disease with stage 5 chronic kidney disease or end stage renal disease: Secondary | ICD-10-CM | POA: Diagnosis present

## 2016-09-01 DIAGNOSIS — K831 Obstruction of bile duct: Secondary | ICD-10-CM | POA: Diagnosis present

## 2016-09-01 DIAGNOSIS — Z7952 Long term (current) use of systemic steroids: Secondary | ICD-10-CM | POA: Diagnosis not present

## 2016-09-01 DIAGNOSIS — Z992 Dependence on renal dialysis: Secondary | ICD-10-CM | POA: Diagnosis not present

## 2016-09-01 DIAGNOSIS — R739 Hyperglycemia, unspecified: Secondary | ICD-10-CM | POA: Diagnosis present

## 2016-09-01 DIAGNOSIS — Z833 Family history of diabetes mellitus: Secondary | ICD-10-CM | POA: Diagnosis not present

## 2016-09-01 DIAGNOSIS — J189 Pneumonia, unspecified organism: Principal | ICD-10-CM

## 2016-09-01 DIAGNOSIS — Z9221 Personal history of antineoplastic chemotherapy: Secondary | ICD-10-CM | POA: Diagnosis not present

## 2016-09-01 DIAGNOSIS — C9000 Multiple myeloma not having achieved remission: Secondary | ICD-10-CM | POA: Diagnosis present

## 2016-09-01 DIAGNOSIS — N2581 Secondary hyperparathyroidism of renal origin: Secondary | ICD-10-CM | POA: Diagnosis present

## 2016-09-01 DIAGNOSIS — Z888 Allergy status to other drugs, medicaments and biological substances status: Secondary | ICD-10-CM | POA: Diagnosis not present

## 2016-09-01 DIAGNOSIS — D631 Anemia in chronic kidney disease: Secondary | ICD-10-CM | POA: Diagnosis present

## 2016-09-01 DIAGNOSIS — T380X5A Adverse effect of glucocorticoids and synthetic analogues, initial encounter: Secondary | ICD-10-CM | POA: Diagnosis present

## 2016-09-01 DIAGNOSIS — Z885 Allergy status to narcotic agent status: Secondary | ICD-10-CM | POA: Diagnosis not present

## 2016-09-01 DIAGNOSIS — R1011 Right upper quadrant pain: Secondary | ICD-10-CM | POA: Diagnosis present

## 2016-09-01 DIAGNOSIS — Y95 Nosocomial condition: Secondary | ICD-10-CM | POA: Diagnosis present

## 2016-09-01 DIAGNOSIS — Z880 Allergy status to penicillin: Secondary | ICD-10-CM | POA: Diagnosis not present

## 2016-09-01 DIAGNOSIS — R197 Diarrhea, unspecified: Secondary | ICD-10-CM | POA: Diagnosis present

## 2016-09-01 LAB — CBC WITH DIFFERENTIAL/PLATELET
BASOS ABS: 0 10*3/uL (ref 0.0–0.1)
BASOS PCT: 0 %
Eosinophils Absolute: 0 10*3/uL (ref 0.0–0.7)
Eosinophils Relative: 0 %
HEMATOCRIT: 27.4 % — AB (ref 39.0–52.0)
HEMOGLOBIN: 8.5 g/dL — AB (ref 13.0–17.0)
LYMPHS PCT: 10 %
Lymphs Abs: 0.9 10*3/uL (ref 0.7–4.0)
MCH: 28.7 pg (ref 26.0–34.0)
MCHC: 31 g/dL (ref 30.0–36.0)
MCV: 92.6 fL (ref 78.0–100.0)
MONO ABS: 1.2 10*3/uL — AB (ref 0.1–1.0)
MONOS PCT: 13 %
NEUTROS ABS: 7.1 10*3/uL (ref 1.7–7.7)
Neutrophils Relative %: 76 %
Platelets: 60 10*3/uL — ABNORMAL LOW (ref 150–400)
RBC: 2.96 MIL/uL — ABNORMAL LOW (ref 4.22–5.81)
RDW: 14.9 % (ref 11.5–15.5)
WBC: 9.2 10*3/uL (ref 4.0–10.5)

## 2016-09-01 LAB — URINE MICROSCOPIC-ADD ON

## 2016-09-01 LAB — URINALYSIS, ROUTINE W REFLEX MICROSCOPIC
BILIRUBIN URINE: NEGATIVE
Glucose, UA: NEGATIVE mg/dL
Hgb urine dipstick: NEGATIVE
KETONES UR: NEGATIVE mg/dL
Leukocytes, UA: NEGATIVE
NITRITE: NEGATIVE
PROTEIN: 100 mg/dL — AB
SPECIFIC GRAVITY, URINE: 1.012 (ref 1.005–1.030)
pH: 7 (ref 5.0–8.0)

## 2016-09-01 LAB — GLUCOSE, CAPILLARY
GLUCOSE-CAPILLARY: 83 mg/dL (ref 65–99)
GLUCOSE-CAPILLARY: 80 mg/dL (ref 65–99)
GLUCOSE-CAPILLARY: 109 mg/dL — AB (ref 65–99)
GLUCOSE-CAPILLARY: 131 mg/dL — AB (ref 65–99)
Glucose-Capillary: 210 mg/dL — ABNORMAL HIGH (ref 65–99)

## 2016-09-01 LAB — COMPREHENSIVE METABOLIC PANEL
ALBUMIN: 2.6 g/dL — AB (ref 3.5–5.0)
ALK PHOS: 92 U/L (ref 38–126)
ALT: 14 U/L — ABNORMAL LOW (ref 17–63)
ANION GAP: 9 (ref 5–15)
AST: 36 U/L (ref 15–41)
BUN: 18 mg/dL (ref 6–20)
CALCIUM: 8.6 mg/dL — AB (ref 8.9–10.3)
CO2: 24 mmol/L (ref 22–32)
Chloride: 101 mmol/L (ref 101–111)
Creatinine, Ser: 2.19 mg/dL — ABNORMAL HIGH (ref 0.61–1.24)
GFR calc non Af Amer: 29 mL/min — ABNORMAL LOW (ref >60)
GFR, EST AFRICAN AMERICAN: 34 mL/min — AB (ref >60)
GLUCOSE: 82 mg/dL (ref 65–99)
POTASSIUM: 3.7 mmol/L (ref 3.5–5.1)
SODIUM: 134 mmol/L — AB (ref 135–145)
TOTAL PROTEIN: 6.9 g/dL (ref 6.5–8.1)
Total Bilirubin: 0.5 mg/dL (ref 0.3–1.2)

## 2016-09-01 LAB — C DIFFICILE QUICK SCREEN W PCR REFLEX
C DIFFICLE (CDIFF) ANTIGEN: NEGATIVE
C Diff interpretation: NOT DETECTED
C Diff toxin: NEGATIVE

## 2016-09-01 LAB — I-STAT CG4 LACTIC ACID, ED: Lactic Acid, Venous: 1.38 mmol/L (ref 0.5–1.9)

## 2016-09-01 LAB — MRSA PCR SCREENING: MRSA BY PCR: NEGATIVE

## 2016-09-01 MED ORDER — DEXTROSE 5 % IV SOLN
2.0000 g | Freq: Three times a day (TID) | INTRAVENOUS | Status: DC
  Administered 2016-09-01: 2 g via INTRAVENOUS
  Filled 2016-09-01 (×2): qty 2

## 2016-09-01 MED ORDER — VANCOMYCIN HCL 10 G IV SOLR
1500.0000 mg | Freq: Once | INTRAVENOUS | Status: AC
  Administered 2016-09-01: 1500 mg via INTRAVENOUS
  Filled 2016-09-01: qty 1500

## 2016-09-01 MED ORDER — DEXTROSE 5 % IV SOLN
2.0000 g | Freq: Once | INTRAVENOUS | Status: AC
  Administered 2016-09-01: 2 g via INTRAVENOUS
  Filled 2016-09-01: qty 2

## 2016-09-01 MED ORDER — VITAMIN B-12 1000 MCG PO TABS
1000.0000 ug | ORAL_TABLET | Freq: Every day | ORAL | Status: DC
  Administered 2016-09-01 – 2016-09-03 (×3): 1000 ug via ORAL
  Filled 2016-09-01 (×3): qty 1

## 2016-09-01 MED ORDER — ONDANSETRON HCL 4 MG PO TABS: 4.0000 mg | ORAL_TABLET | Freq: Four times a day (QID) | ORAL | Status: DC | PRN

## 2016-09-01 MED ORDER — RESOURCE INSTANT PROTEIN PO PWD PACKET
1.0000 | Freq: Three times a day (TID) | ORAL | Status: DC
  Filled 2016-09-01: qty 6

## 2016-09-01 MED ORDER — RENA-VITE PO TABS
1.0000 | ORAL_TABLET | Freq: Every day | ORAL | Status: DC
  Administered 2016-09-01 – 2016-09-02 (×2): 1 via ORAL
  Filled 2016-09-01 (×2): qty 1

## 2016-09-01 MED ORDER — TRIAMCINOLONE ACETONIDE 55 MCG/ACT NA AERO
2.0000 | INHALATION_SPRAY | Freq: Every day | NASAL | Status: DC
  Administered 2016-09-01: 2 via NASAL
  Filled 2016-09-01: qty 21.6

## 2016-09-01 MED ORDER — ACETAMINOPHEN 325 MG PO TABS: 650.0000 mg | ORAL_TABLET | Freq: Four times a day (QID) | ORAL | Status: DC | PRN

## 2016-09-01 MED ORDER — ACYCLOVIR 200 MG PO CAPS
200.0000 mg | ORAL_CAPSULE | Freq: Two times a day (BID) | ORAL | Status: DC
Start: 2016-09-01 — End: 2016-09-03
  Administered 2016-09-01 – 2016-09-03 (×5): 200 mg via ORAL
  Filled 2016-09-01 (×5): qty 1

## 2016-09-01 MED ORDER — VITAMIN D (ERGOCALCIFEROL) 1.25 MG (50000 UNIT) PO CAPS: 50000.0000 [IU] | ORAL_CAPSULE | ORAL | Status: DC

## 2016-09-01 MED ORDER — MIDODRINE HCL 5 MG PO TABS
10.0000 mg | ORAL_TABLET | Freq: Three times a day (TID) | ORAL | Status: DC
Start: 2016-09-01 — End: 2016-09-03
  Administered 2016-09-01 – 2016-09-03 (×6): 10 mg via ORAL
  Filled 2016-09-01 (×6): qty 2

## 2016-09-01 MED ORDER — BENEPROTEIN PO POWD
1.0000 | Freq: Three times a day (TID) | ORAL | Status: DC
  Administered 2016-09-01 – 2016-09-03 (×5): 6 g via ORAL
  Filled 2016-09-01: qty 227

## 2016-09-01 MED ORDER — ONDANSETRON HCL 4 MG/2ML IJ SOLN: 4.0000 mg | Freq: Four times a day (QID) | INTRAMUSCULAR | Status: DC | PRN

## 2016-09-01 MED ORDER — CALCIUM ACETATE (PHOS BINDER) 667 MG PO CAPS
667.0000 mg | ORAL_CAPSULE | Freq: Two times a day (BID) | ORAL | Status: DC
  Administered 2016-09-01 – 2016-09-03 (×4): 667 mg via ORAL
  Filled 2016-09-01 (×4): qty 1

## 2016-09-01 MED ORDER — DEXTROSE 5 % IV SOLN
500.0000 mg | Freq: Two times a day (BID) | INTRAVENOUS | Status: DC
  Filled 2016-09-01: qty 0.5

## 2016-09-01 MED ORDER — DEXAMETHASONE 4 MG PO TABS
2.0000 mg | ORAL_TABLET | Freq: Every day | ORAL | Status: DC
  Administered 2016-09-01 – 2016-09-03 (×2): 2 mg via ORAL
  Filled 2016-09-01 (×2): qty 1

## 2016-09-01 MED ORDER — PANTOPRAZOLE SODIUM 40 MG PO TBEC
40.0000 mg | DELAYED_RELEASE_TABLET | Freq: Every day | ORAL | Status: DC
  Administered 2016-09-01 – 2016-09-03 (×3): 40 mg via ORAL
  Filled 2016-09-01 (×3): qty 1

## 2016-09-01 MED ORDER — VANCOMYCIN HCL IN DEXTROSE 750-5 MG/150ML-% IV SOLN
750.0000 mg | INTRAVENOUS | Status: DC
  Administered 2016-09-02 (×2): 750 mg via INTRAVENOUS
  Filled 2016-09-01: qty 150

## 2016-09-01 MED ORDER — ACETAMINOPHEN 650 MG RE SUPP: 650.0000 mg | Freq: Four times a day (QID) | RECTAL | Status: DC | PRN

## 2016-09-01 MED ORDER — DEXTROSE 5 % IV SOLN
1.0000 g | INTRAVENOUS | Status: DC
  Administered 2016-09-02 (×2): 1 g via INTRAVENOUS
  Filled 2016-09-01 (×5): qty 1

## 2016-09-01 MED ORDER — VANCOMYCIN HCL IN DEXTROSE 1-5 GM/200ML-% IV SOLN: 1000.0000 mg | INTRAVENOUS | Status: DC

## 2016-09-01 MED ORDER — LORATADINE 10 MG PO TABS
10.0000 mg | ORAL_TABLET | Freq: Every day | ORAL | Status: DC
  Administered 2016-09-01 – 2016-09-03 (×3): 10 mg via ORAL
  Filled 2016-09-01 (×3): qty 1

## 2016-09-01 MED ORDER — INSULIN ASPART 100 UNIT/ML ~~LOC~~ SOLN
0.0000 [IU] | Freq: Three times a day (TID) | SUBCUTANEOUS | Status: DC
  Administered 2016-09-01: 1 [IU] via SUBCUTANEOUS
  Administered 2016-09-02: 2 [IU] via SUBCUTANEOUS

## 2016-09-01 NOTE — Consult Note (Signed)
Renal Service Consult Note [facility]  Rodell Marrs 09/01/2016 Sol Blazing Requesting Physician:  Dr Cruzita Lederer  Reason for Consult:  ESRD pt with fever HPI: The patient is a 70 y.o. year-old with hx of multiple myeloma, ESRD started HD in June 2017, also hx acalculous cholecystitis in Aug '17 treated w chole tube / abx.  [place] on Aug 23rd.  Has done well and is back on chemo w Velcade 3 of every 4 weeks and lowered dose Decadron.  Presented to ED last night with chills and temp 100.6 at home, felt "really cold', w malaise and +cough.  CXR in ED showed RLL poss PNA at the base.  Admitted and started on IV abx for prob PNA.  Asked to see for HD.    Patient no problems w HD, using R groin TDC, hasn't had a chance to get his fistula placed due to other complications since starting HD.  No CP, no abd pain.  +having loose stools/ diarrhea about 24 hours.  No joint pains or rash.     ROS  denies CP  no joint pain   no HA  no blurry vision  no rash  no nausea/ vomiting  no dysuria  no difficulty voiding  no change in urine color    Past Medical History  Past Medical History:  Diagnosis Date  . Anemia    ANEMIA OF RENAL DISEASE  . Cancer ([place])   . Chronic kidney disease   . Compression fracture   . Elevated LFTs 07/2016  . Thrombocytopenia ([place]) 07/2016   Past Surgical History  Past Surgical History:  Procedure Laterality Date  . AV FISTULA PLACEMENT Left 05/24/2016   Procedure: ARTERIOVENOUS (AV) FISTULA CREATION-LEFT;  Surgeon: Conrad [place], MD;  Location: [place];  Service: Vascular;  Laterality: Left;  . BONE BIOPSY Left 05/14/2016   Procedure: LEFT FEMORAL BIOPSY WITH INTRAOPERATIVE FROZEN SECTIONS ;  Surgeon: Rod Can, MD;  Location: [place];  Service: Orthopedics;  Laterality: Left;  . FEMUR IM NAIL Left 05/14/2016   Procedure: INTRAMEDULLARY (IM) NAIL FEMORAL;  Surgeon: Rod Can, MD;  Location: [place];  Service: Orthopedics;  Laterality: Left;  . IR  GENERIC HISTORICAL  07/17/2016   IR PERC CHOLECYSTOSTOMY 07/17/2016 Arne Cleveland, MD [facility]-INTERV RAD  . IR GENERIC HISTORICAL  08/30/2016   IR SINUS/FIST TUBE CHK-NON GI 08/30/2016 Greggory Keen, MD [facility]-INTERV RAD  . MOUTH SURGERY    . SP CHOLECYSTOMY  07/2016   cholecystostomy drain placed under [place] and fluoro   Family History  Family History  Problem Relation Age of Onset  . Diabetes Father    Social History  reports that he has quit smoking. He has never used smokeless tobacco. He reports that he does not drink alcohol or use drugs. Allergies  Allergies  Allergen Reactions  . Penicillins Rash and Other (See Comments)    Has patient had a PCN reaction causing immediate rash, facial/tongue/throat swelling, SOB or lightheadedness with hypotension: YES + Reaction causing SEVERE RASH involving MUCUS MEMBRANES or SKIN NECROSIS >> YES Reaction that required hospitalization: NO Reaction occurring within the last 10 years: NO If all of the above answers are "NO", then may proceed with Cephalosporin use.   . Dilaudid [Hydromorphone Hcl] Other (See Comments)    hallucinations  . Fish Allergy Nausea And Vomiting  . Codeine Other (See Comments)    "MAKES ME JUMPY"   Home medications Prior to Admission medications   Medication Sig Start Date End Date  Taking? Authorizing Provider  acyclovir (ZOVIRAX) 200 MG capsule Take 1 capsule (200 mg total) by mouth 2 (two) times daily. 07/03/16  Yes Heath Lark, MD  calcium acetate (PHOSLO) 667 MG capsule Take 1 capsule (667 mg total) by mouth 2 (two) times daily with a meal. 06/08/16  Yes Daniel J Angiulli, PA-C  Cholecalciferol (VITAMIN D3) 50000 units CAPS Take 1 capsule by mouth once a week.   Yes Historical Provider, MD  cyanocobalamin 1000 MCG tablet Take 1 tablet (1,000 mcg total) by mouth daily. 06/08/16  Yes Daniel J Angiulli, PA-C  dexamethasone (DECADRON) 2 MG tablet Take 2 mg by mouth daily.   Yes Historical Provider, MD  insulin aspart (NOVOLOG) 100  UNIT/ML injection Before each meal 3 times a day, 140-199 - 2 units, 200-250 - 4 units, 251-299 - 6 units,  300-349 - 8 units,  350 or above 10 units. Dispense syringes and needles as needed, Ok to switch to PEN if approved. Substitute to any brand approved. DX DM2, Code E11.65 08/02/16  Yes Thurnell Lose, MD  loratadine (CLARITIN) 10 MG tablet Take 10 mg by mouth daily.    Yes Historical Provider, MD  midodrine (PROAMATINE) 10 MG tablet Take 1 tablet (10 mg total) by mouth 3 (three) times daily with meals. 08/02/16  Yes Thurnell Lose, MD  multivitamin (RENA-VIT) TABS tablet Take 1 tablet by mouth at bedtime. 06/08/16  Yes Daniel J Angiulli, PA-C  pantoprazole (PROTONIX) 40 MG tablet Take 1 tablet (40 mg total) by mouth daily. 06/08/16  Yes Daniel J Angiulli, PA-C  triamcinolone (NASACORT AQ) 55 MCG/ACT AERO nasal inhaler Place 2 sprays into the nose daily. 07/03/16  Yes Heath Lark, MD  acetaminophen (TYLENOL) 325 MG tablet Take 2 tablets (650 mg total) by mouth every 6 (six) hours as needed for mild pain (or Fever >/= 101). Patient not taking: Reported on 08/31/2016 05/29/16   Flonnie Overman Dhungel, MD  ALPRAZolam Duanne Moron) 0.25 MG tablet Take 1 tablet (0.25 mg total) by mouth 2 (two) times daily as needed for anxiety. Patient not taking: Reported on 08/31/2016 08/02/16   Thurnell Lose, MD  bisacodyl (DULCOLAX) 10 MG suppository Place 1 suppository (10 mg total) rectally daily as needed for moderate constipation. Patient not taking: Reported on 08/31/2016 08/02/16   Thurnell Lose, MD  dexamethasone (DECADRON) 4 MG tablet Take 1 tablet (4 mg total) by mouth daily. Patient not taking: Reported on 08/31/2016 08/02/16   Thurnell Lose, MD  ondansetron (ZOFRAN) 8 MG tablet Take 1 tablet (8 mg total) by mouth every 8 (eight) hours as needed for nausea. Patient not taking: Reported on 08/31/2016 07/03/16   Heath Lark, MD  oxyCODONE (OXY IR/ROXICODONE) 5 MG immediate release tablet Take 1 tablet (5 mg total) by  mouth every 4 (four) hours as needed for moderate pain. Patient not taking: Reported on 08/31/2016 08/02/16   Thurnell Lose, MD  polyethylene glycol [facility] / Floria Raveling) packet Take 17 g by mouth daily as needed for mild constipation or moderate constipation. Patient not taking: Reported on 08/31/2016 08/02/16   Thurnell Lose, MD  protein supplement (RESOURCE BENEPROTEIN) POWD Take 6 g by mouth 3 (three) times daily with meals. Patient not taking: Reported on 08/31/2016 08/02/16   Thurnell Lose, MD   Liver Function Tests  Recent Labs Lab 08/28/16 0949 08/31/16 2223 09/01/16 0900  AST 12 21 36  ALT 12 16* 14*  ALKPHOS 111 113 92  BILITOT 0.55 0.5 0.5  PROT 7.6 7.3 6.9  ALBUMIN 2.6* 2.8* 2.6*   No results for input(s): LIPASE, AMYLASE in the last 168 hours. CBC  Recent Labs Lab 08/28/16 0949 08/31/16 2223 09/01/16 0900  WBC 8.9 13.3* 9.2  NEUTROABS 6.6* 11.0* 7.1  HGB 9.7* 9.4* 8.5*  HCT 29.5* 30.3* 27.4*  MCV 90.2 93.2 92.6  PLT 90* 78* 60*   Basic Metabolic Panel  Recent Labs Lab 08/28/16 0949 08/31/16 2223 09/01/16 0900  NA 137 133* 134*  K 4.0 4.2 3.7  CL  --  98* 101  CO2 _0 GLUCOSE 185* 176* 82  BUN 25._1 CREATININE 2.6* 1.73* 2.19*  CALCIUM 9.2 8.9 8.6*   Iron/TIBC/Ferritin/ %Sat    Component Value Date/Time   IRON 64 05/10/2016 1156   TIBC 167 (L) 05/10/2016 1156   IRONPCTSAT 38 05/10/2016 1156    Vitals:   09/01/16 0330 09/01/16 0400 09/01/16 1000 09/01/16 1332  BP: 133/64 (!) 146/67 128/78 (!) 129/48  Pulse: 99 97 82 89  Resp: _2 Temp:  (!) 100.5 F (38.1 C) 98.4 F (36.9 C)   TempSrc:  Oral Oral   SpO2: 99% 100% 100%   Weight:      Height:       Exam Gen alert, not toxic No rash, cyanosis or gangrene Sclera anicteric, throat clear  No jvd or bruits Chest some coarse rales R base but o/w clear RRR no MRG Abd soft ntnd no mass or ascites +bs, RUQ drain GU normal male MS no joint effusions or  deformity Ext no LE or UE edema / no wounds or ulcers Neuro is alert, Ox 3 , nf R groin HD cath tunneled no drainage / erythema/ fluctuance   Dialysis: TTS SW  4h  3K/2.25 bath  64kg  Hep 7300   No meds  Assessment: 1. Fevers - w cough/RLL infiltrate, poss HCAP.  Also w diarrhea, will send for Cdif.  On IV abx 2. ESRD - HD TTS, hd tomorrow 3. M. Myeloma - on chemo w Velcade / pred 4. Acalc cholecystitis - w perc drain in from Aug '17 admit 5. Thrombocytopenia - unclear cause, poss from chemo 6. Volume - is euvolemic on exam, at dry wt   Plan - HD tomorrow, 1st shift , minimal UF  [name] MD [place] pager [phone]    cell [phone] 09/01/2016, 2:02 PM

## 2016-09-01 NOTE — ED Notes (Signed)
Report called to Kathy, RN at this time.  Receiving nurse denies having any further questions at this time.   

## 2016-09-01 NOTE — Progress Notes (Addendum)
Pharmacy Antibiotic Note Thomas [name] is a 70 y.o. male admitted on 08/31/2016 with pneumonia.  Pharmacy has been consulted for Vancomycin dosing.  Plan: 1. Vancomycin 750 mg every Tuesday, Thursday and Saturday with hemodialysis 2. Cefepime 1 grams IV every 24 hours (tolerated Ceftriaxone earlier this admission)    Height: 6\' 2"  (188 cm) Weight: 141 lb (64 kg) IBW/kg (Calculated) : 82.2  Temp (24hrs), Avg:98.8 F (37.1 C), Min:97.7 F (36.5 C), Max:100.5 F (38.1 C)   Recent Labs Lab 08/28/16 0949 08/31/16 2223 08/31/16 2231 09/01/16 0225  WBC 8.9 13.3*  --   --   CREATININE 2.6* 1.73*  --   --   LATICACIDVEN  --   --  3.30* 1.38    Estimated Creatinine Clearance: 37 mL/min (by C-G formula based on SCr of 1.73 mg/dL (H)).    Allergies  Allergen Reactions  . Penicillins Rash and Other (See Comments)    Has patient had a PCN reaction causing immediate rash, facial/tongue/throat swelling, SOB or lightheadedness with hypotension: YES + Reaction causing SEVERE RASH involving MUCUS MEMBRANES or SKIN NECROSIS >> YES Reaction that required hospitalization: NO Reaction occurring within the last 10 years: NO If all of the above answers are "NO", then may proceed with Cephalosporin use.   . Dilaudid [Hydromorphone Hcl] Other (See Comments)    hallucinations  . Fish Allergy Nausea And Vomiting  . Codeine Other (See Comments)    "MAKES ME JUMPY"     Thank you for allowing pharmacy to be a part of this patient's care.  Vincenza Hews, PharmD, BCPS 09/01/2016, 1:20 PM Pager: [phone]

## 2016-09-01 NOTE — Telephone Encounter (Signed)
-----   Message from Ladene Artist, MD sent at 08/30/2016  4:43 PM EDT ----- Dr. Greer Pickerel called. CG patient with CBD stone on IR cholecystostomy tube study. Needs office appt to discuss and evaluate for ERCP. APP appt is ok and they can schedule ERCP with CG.

## 2016-09-01 NOTE — Telephone Encounter (Signed)
Patient is currently inpatient for pneumonia.  He should go home tomorrow.  He is scheduled for Monday at 11:30 with Dr. Carlean Purl

## 2016-09-01 NOTE — Consult Note (Signed)
   [facility] Inpatient Consult   09/01/2016  Thomas [name] 10-18-1946 [id number]  Patient is currently active with [facility] for chronic disease management services.  Patient has been engaged by a [employer]. Per MD notes the patient, Thomas [name],  is a 70 y.o. male with multiple myeloma on chemotherapy not in remission, ESRD on hemodialysis on Tuesday Thursday and Saturday, hyperglycemia secondary to steroids who was admitted last month for acalculous cholecystitis and had perc drain placed which is still in place started experiencing fever and chills last night at home. Patient had dialysis followed by chemotherapy yesterday. Patient's family states patient had a fever of around 103F. In the ER patient's chest x-ray shows possible left lower lobe collapse with possible infiltrates. Patient has been having active productive cough for last few weeks  Our community based plan of care has focused on disease management and community resource support. Active consent on file.   Inpatient Case Manager made this writer aware  that [facility].  Will follow patient for progression and care management needs for post hospital stay.   Of note, [facility] Care Management services does not replace or interfere with any services that are needed or arranged by inpatient case management or social work.  For additional questions or referrals please contact:  Natividad Brood, RN BSN [facility] Liaison  [phone] business mobile phone Toll free office [phone]

## 2016-09-01 NOTE — Progress Notes (Signed)
Pt placed on enteric precautions, pt and wife educated on precaution protocol.

## 2016-09-01 NOTE — Progress Notes (Signed)
PROGRESS NOTE  Thomas [name] MRN:[id number] DOB: 08/05/1946 DOA: 08/31/2016 PCP: No PCP Per Patient   LOS: 0 days   Brief Narrative: 70 y.o. male with multiple myeloma on chemotherapy not in remission, ESRD on hemodialysis on Tuesday Thursday and Saturday, hyperglycemia secondary to steroids who was admitted last month for acalculous cholecystitis and had perc drain placed which is still in place started experiencing fever and chills last night at home. Patient had dialysis followed by chemotherapy yesterday. Patient's family states patient had a fever of around 103F. In the ER patient's chest x-ray shows possible left lower lobe collapse with possible infiltrates. Patient has been having active productive cough for last few weeks.  Assessment & Plan: Principal Problem:   HCAP (healthcare-associated pneumonia) Active Problems:   ESRD on dialysis (HCC)   Multiple myeloma not having achieved remission (HCC)   Thrombocytopenia (HCC)   Anemia of renal disease   Steroid-induced hyperglycemia   Healthcare associated pneumonia - suspect patient's fever probably from healthcare associated pneumonia since patient is having persistent productive cough. However since patient also has dialysis catheter need to follow blood cultures and has had recent bacteremia from acalculus cholecystitis. Sonogram of the abdomen was done and as per the ER physician it was showing patent stent and drain, official results to be followed. Check UA.  ESRD on hemodialysis on Tuesday Thursday and Saturday - consulted nephrology.  Steroid-induced hyperglycemia - on sliding scale coverage.  Multiple myeloma on chemotherapy per oncology.  Anemia and thrombocytopenia probably secondary to multiple myeloma and also anemia could be from ESRD.  Recent admission for acalculus cholecystitis requiring percutaneous drain placement. - fistulogram 2 days ago with "Abrupt distal CBD obstruction, suspicious for impacted  choledocholithiasis versus underlying obstructing lesion or mass". LFTs normal, patient is asymptomatic, drain in place and working, he has outpatient follow up with GI on Monday. If he will be hospitalized at that time will let GI know   DVT prophylaxis: SCD Code Status: Full Family Communication: wife and daughter bedside Disposition Plan: home when ready   Consultants:   None   Procedures:   None   Antimicrobials:  Vancomycin 9/22 >>  Cefepime 9/22 >>   Subjective: - no chest pain, shortness of breath, no abdominal pain, nausea or vomiting. Coughing, productive  Objective: Vitals:   09/01/16 0300 09/01/16 0315 09/01/16 0330 09/01/16 0400  BP: 118/56 138/68 133/64 (!) 146/67  Pulse: 96 106 99 97  Resp: 18 19 18 18  Temp:    (!) 100.5 F (38.1 C)  TempSrc:    Oral  SpO2: 100% 100% 99% 100%  Weight:      Height:       No intake or output data in the 24 hours ending 09/01/16 1140 Filed Weights   08/31/16 2204  Weight: 64 kg (141 lb)    Examination: Constitutional: NAD Vitals:   09/01/16 0300 09/01/16 0315 09/01/16 0330 09/01/16 0400  BP: 118/56 138/68 133/64 (!) 146/67  Pulse: 96 106 99 97  Resp: 18 19 18 18  Temp:    (!) 100.5 F (38.1 C)  TempSrc:    Oral  SpO2: 100% 100% 99% 100%  Weight:      Height:       Eyes: PERRL Respiratory: rhonchi on left lung field, no wheezing, no crackles Cardiovascular: Regular rate and rhythm, no murmurs / rubs / gallops. No LE edema.  Abdomen: no tenderness. Bowel sounds positive. Drain in place Musculoskeletal: no clubbing / cyanosis.  Skin: no rashes,   lesions, ulcers. No induration Neurologic: non focal    Data Reviewed: I have personally reviewed following labs and imaging studies  CBC:  Recent Labs Lab 08/28/16 0949 08/31/16 2223 09/01/16 0900  WBC 8.9 13.3* 9.2  NEUTROABS 6.6* 11.0* 7.1  HGB 9.7* 9.4* 8.5*  HCT 29.5* 30.3* 27.4*  MCV 90.2 93.2 92.6  PLT 90* 78* 60*   Basic Metabolic  Panel:  Recent Labs Lab 08/28/16 0949 08/31/16 2223 09/01/16 0900  NA 137 133* 134*  K 4.0 4.2 3.7  CL  --  98* 101  CO2 23 25 24  GLUCOSE 185* 176* 82  BUN 25.7 12 18  CREATININE 2.6* 1.73* 2.19*  CALCIUM 9.2 8.9 8.6*   GFR: Estimated Creatinine Clearance: 29.2 mL/min (by C-G formula based on SCr of 2.19 mg/dL (H)). Liver Function Tests:  Recent Labs Lab 08/28/16 0949 08/31/16 2223 09/01/16 0900  AST 12 21 36  ALT 12 16* 14*  ALKPHOS 111 113 92  BILITOT 0.55 0.5 0.5  PROT 7.6 7.3 6.9  ALBUMIN 2.6* 2.8* 2.6*   No results for input(s): LIPASE, AMYLASE in the last 168 hours. No results for input(s): AMMONIA in the last 168 hours. Coagulation Profile: No results for input(s): INR, PROTIME in the last 168 hours. Cardiac Enzymes: No results for input(s): CKTOTAL, CKMB, CKMBINDEX, TROPONINI in the last 168 hours. BNP (last 3 results) No results for input(s): PROBNP in the last 8760 hours. HbA1C: No results for input(s): HGBA1C in the last 72 hours. CBG:  Recent Labs Lab 09/01/16 0402 09/01/16 0738  GLUCAP 83 80   Lipid Profile: No results for input(s): CHOL, HDL, LDLCALC, TRIG, CHOLHDL, LDLDIRECT in the last 72 hours. Thyroid Function Tests: No results for input(s): TSH, T4TOTAL, FREET4, T3FREE, THYROIDAB in the last 72 hours. Anemia Panel: No results for input(s): VITAMINB12, FOLATE, FERRITIN, TIBC, IRON, RETICCTPCT in the last 72 hours. Urine analysis:    Component Value Date/Time   COLORURINE YELLOW 09/01/2016 0237   APPEARANCEUR CLEAR 09/01/2016 0237   LABSPEC 1.012 09/01/2016 0237   PHURINE 7.0 09/01/2016 0237   GLUCOSEU NEGATIVE 09/01/2016 0237   HGBUR NEGATIVE 09/01/2016 0237   BILIRUBINUR NEGATIVE 09/01/2016 0237   KETONESUR NEGATIVE 09/01/2016 0237   PROTEINUR 100 (A) 09/01/2016 0237   NITRITE NEGATIVE 09/01/2016 0237   LEUKOCYTESUR NEGATIVE 09/01/2016 0237   Sepsis Labs: Invalid input(s): PROCALCITONIN, LACTICIDVEN  Recent Results (from  the past 240 hour(s))  MRSA PCR Screening     Status: None   Collection Time: 09/01/16  4:36 AM  Result Value Ref Range Status   MRSA by PCR NEGATIVE NEGATIVE Final    Comment:        The GeneXpert MRSA Assay (FDA approved for NASAL specimens only), is one component of a comprehensive MRSA colonization surveillance program. It is not intended to diagnose MRSA infection nor to guide or monitor treatment for MRSA infections.       Radiology Studies: Dg Chest 2 View  Result Date: 08/31/2016 CLINICAL DATA:  Fever today. Cough for 1 month. Chemotherapy patient. Nonsmoker. EXAM: CHEST  2 VIEW COMPARISON:  07/13/2016 FINDINGS: Right central venous catheter with tip over the cavoatrial junction region. No pneumothorax. Slightly shallow inspiration. Normal heart size and pulmonary vascularity. Emphysematous changes in the lungs. Increased density in the left lung base posteriorly likely representing left lower lobe collapse. This could be caused by pneumonia or endobronchial lesion. Pleural effusions have improved since previous study but remain present bilaterally. Calcification of the aorta. Degenerative changes   in the spine. IMPRESSION: Left lower lobe collapse. Can't exclude pneumonia or endobronchial lesion. Emphysematous changes in the lungs. Electronically Signed   By: William  Stevens M.D.   On: 08/31/2016 23:14   Ir Sinus/fist Tube Chk-non Gi  Result Date: 08/30/2016 CLINICAL DATA:  Recent a calculus cholecystitis, recent sepsis, multiple myeloma EXAM: SINUS TRACT INJECTION/FISTULOGRAM ANESTHESIA/SEDATION: None. MEDICATIONS: None. CONTRAST:  20mL ISOVUE-300 IOPAMIDOL (ISOVUE-300) INJECTION 61% PROCEDURE: The existing cholecystostomy catheter was injected with contrast. Fluoroscopic imaging performed. COMPLICATIONS: 07/17/2016 FINDINGS: Contrast injection performed of the cholecystostomy. Catheter is within the collapsed gallbladder. Mixing artifact within the gallbladder compatible with  gallbladder sludge. Cystic duct is patent. There is opacification of the intrahepatic ducts, biliary confluence, common hepatic duct. Common bile duct has an abrupt obstruction distally. Distal impacted choledocholithiasis not excluded. Further distention of the biliary system continues to demonstrate obstruction. At this point, the patient was uncomfortable and the injection was stopped. IMPRESSION: Abrupt distal CBD obstruction, suspicious for impacted choledocholithiasis versus underlying obstructing lesion or mass. These results were called by telephone at the time of interpretation on 08/30/2016 at 12:30pm to Dr. ERIC WILSON , who verbally acknowledged these results. Electronically Signed   By: M.  Shick M.D.   On: 08/30/2016 14:27   Us Abdomen Limited Ruq  Result Date: 09/01/2016 CLINICAL DATA:  Acute onset of right upper quadrant abdominal pain and fever. Initial encounter. EXAM: US ABDOMEN LIMITED - RIGHT UPPER QUADRANT COMPARISON:  Right upper quadrant ultrasound performed 07/13/2016 FINDINGS: Gallbladder: No gallstones or wall thickening visualized. The gallbladder is relatively decompressed and difficult to characterize. The patient's cholecystostomy tube is partially visualized. No sonographic Murphy sign noted by sonographer. Common bile duct: Diameter: 0.6 cm, within normal limits in caliber. Liver: No focal lesion identified. Within normal limits in parenchymal echogenicity. IMPRESSION: 1. No acute abnormality seen at the right upper quadrant. 2. Gallbladder relatively decompressed. Cholecystostomy tube is partially characterized. Electronically Signed   By: Jeffery  Chang M.D.   On: 09/01/2016 06:37     Scheduled Meds: . acyclovir  200 mg Oral BID  . aztreonam  2 g Intravenous Q8H  . calcium acetate  667 mg Oral BID WC  . dexamethasone  2 mg Oral Q breakfast  . insulin aspart  0-9 Units Subcutaneous TID WC  . loratadine  10 mg Oral Daily  . midodrine  10 mg Oral TID WC  . multivitamin   1 tablet Oral QHS  . pantoprazole  40 mg Oral Daily  . protein supplement  1 scoop Oral TID WC  . triamcinolone  2 spray Nasal Daily  . [START ON 09/02/2016] vancomycin  1,000 mg Intravenous Q24H  . cyanocobalamin  1,000 mcg Oral Daily  . [START ON 09/06/2016] Vitamin D (Ergocalciferol)  50,000 Units Oral Weekly   Continuous Infusions:    Costin Gherghe, MD, PhD [facility] Pager [phone]  If 7PM-7AM, please contact night-coverage [email or web address] Password [email or web address] 09/01/2016, 11:40 AM     

## 2016-09-01 NOTE — H&P (Signed)
History and Physical    Thomas [name] CZY:[id number] DOB: [birth year] DOA: 08/31/2016  PCP: No PCP Per Patient  Patient coming from: Home.  Chief Complaint: Fever.  HPI: Thomas [name] is a 70 y.o. male with multiple myeloma on chemotherapy not in remission, ESRD on hemodialysis on Tuesday Thursday and Saturday, hyperglycemia secondary to steroids who was admitted last month for acalculous cholecystitis and had perc drain placed which is still in place started experiencing fever and chills last night at home. Patient had dialysis followed by chemotherapy yesterday. Patient's family states patient had a fever of around 103F. In the ER patient's chest x-ray shows possible left lower lobe collapse with possible infiltrates. Patient has been having active productive cough for last few weeks. Since patient also has had a history of acalculous cholecystitis right upper quadrant sonogram was done which as per the ER physician was showing patent stent and drain. Official result is to be followed. Blood cultures were sent and patient was started on empiric antibiotics for healthcare associated pneumonia.   ED Course: Patient was started on empiric antibiotics for healthcare associated pneumonia following blood cultures obtained. Sonogram of the abdomen was done official results to be followed.  Review of Systems: As per HPI, rest all negative.   Past Medical History:  Diagnosis Date  . Anemia    ANEMIA OF RENAL DISEASE  . Cancer ([place])   . Chronic kidney disease   . Compression fracture   . Elevated LFTs 07/2016  . Thrombocytopenia ([place]) 07/2016    Past Surgical History:  Procedure Laterality Date  . AV FISTULA PLACEMENT Left 05/24/2016   Procedure: ARTERIOVENOUS (AV) FISTULA CREATION-LEFT;  Surgeon: Conrad St. Stephens, MD;  Location: [place];  Service: Vascular;  Laterality: Left;  . BONE BIOPSY Left 05/14/2016   Procedure: LEFT FEMORAL BIOPSY WITH INTRAOPERATIVE FROZEN SECTIONS ;  Surgeon: Rod Can,  MD;  Location: [place];  Service: Orthopedics;  Laterality: Left;  . FEMUR IM NAIL Left 05/14/2016   Procedure: INTRAMEDULLARY (IM) NAIL FEMORAL;  Surgeon: Rod Can, MD;  Location: [facility];  Service: Orthopedics;  Laterality: Left;  . IR GENERIC HISTORICAL  07/17/2016   IR PERC CHOLECYSTOSTOMY 07/17/2016 Arne Cleveland, MD [facility]  . IR GENERIC HISTORICAL  08/30/2016   IR SINUS/FIST TUBE CHK-NON GI 08/30/2016 Greggory Keen, MD [facility]  . MOUTH SURGERY    . SP CHOLECYSTOMY  07/2016   cholecystostomy drain placed under [place] and fluoro     reports that he has quit smoking. He has never used smokeless tobacco. He reports that he does not drink alcohol or use drugs.  Allergies  Allergen Reactions  . Penicillins Rash and Other (See Comments)    Has patient had a PCN reaction causing immediate rash, facial/tongue/throat swelling, SOB or lightheadedness with hypotension: YES + Reaction causing SEVERE RASH involving MUCUS MEMBRANES or SKIN NECROSIS >> YES Reaction that required hospitalization: NO Reaction occurring within the last 10 years: NO If all of the above answers are "NO", then may proceed with Cephalosporin use.   . Dilaudid [Hydromorphone Hcl] Other (See Comments)    hallucinations  . Fish Allergy Nausea And Vomiting  . Codeine Other (See Comments)    "MAKES ME JUMPY"    Family History  Problem Relation Age of Onset  . Diabetes Father     Prior to Admission medications   Medication Sig Start Date End Date Taking? Authorizing Provider  acyclovir (ZOVIRAX) 200 MG capsule Take 1 capsule (200 mg total) by  mouth 2 (two) times daily. 07/03/16  Yes Heath Lark, MD  calcium acetate (PHOSLO) 667 MG capsule Take 1 capsule (667 mg total) by mouth 2 (two) times daily with a meal. 06/08/16  Yes Daniel J Angiulli, PA-C  Cholecalciferol (VITAMIN D3) 50000 units CAPS Take 1 capsule by mouth once a week.   Yes Historical Provider, MD  cyanocobalamin 1000 MCG tablet Take 1 tablet (1,000 mcg  total) by mouth daily. 06/08/16  Yes Daniel J Angiulli, PA-C  dexamethasone (DECADRON) 2 MG tablet Take 2 mg by mouth daily.   Yes Historical Provider, MD  insulin aspart (NOVOLOG) 100 UNIT/ML injection Before each meal 3 times a day, 140-199 - 2 units, 200-250 - 4 units, 251-299 - 6 units,  300-349 - 8 units,  350 or above 10 units. Dispense syringes and needles as needed, Ok to switch to PEN if approved. Substitute to any brand approved. DX DM2, Code E11.65 08/02/16  Yes Thurnell Lose, MD  loratadine (CLARITIN) 10 MG tablet Take 10 mg by mouth daily.    Yes Historical Provider, MD  midodrine (PROAMATINE) 10 MG tablet Take 1 tablet (10 mg total) by mouth 3 (three) times daily with meals. 08/02/16  Yes Thurnell Lose, MD  multivitamin (RENA-VIT) TABS tablet Take 1 tablet by mouth at bedtime. 06/08/16  Yes Daniel J Angiulli, PA-C  pantoprazole (PROTONIX) 40 MG tablet Take 1 tablet (40 mg total) by mouth daily. 06/08/16  Yes Daniel J Angiulli, PA-C  triamcinolone (NASACORT AQ) 55 MCG/ACT AERO nasal inhaler Place 2 sprays into the nose daily. 07/03/16  Yes Heath Lark, MD  acetaminophen (TYLENOL) 325 MG tablet Take 2 tablets (650 mg total) by mouth every 6 (six) hours as needed for mild pain (or Fever >/= 101). Patient not taking: Reported on 08/31/2016 05/29/16   Flonnie Overman Dhungel, MD  ALPRAZolam Duanne Moron) 0.25 MG tablet Take 1 tablet (0.25 mg total) by mouth 2 (two) times daily as needed for anxiety. Patient not taking: Reported on 08/31/2016 08/02/16   Thurnell Lose, MD  bisacodyl (DULCOLAX) 10 MG suppository Place 1 suppository (10 mg total) rectally daily as needed for moderate constipation. Patient not taking: Reported on 08/31/2016 08/02/16   Thurnell Lose, MD  dexamethasone (DECADRON) 4 MG tablet Take 1 tablet (4 mg total) by mouth daily. Patient not taking: Reported on 08/31/2016 08/02/16   Thurnell Lose, MD  ondansetron (ZOFRAN) 8 MG tablet Take 1 tablet (8 mg total) by mouth every 8 (eight) hours  as needed for nausea. Patient not taking: Reported on 08/31/2016 07/03/16   Heath Lark, MD  oxyCODONE (OXY IR/ROXICODONE) 5 MG immediate release tablet Take 1 tablet (5 mg total) by mouth every 4 (four) hours as needed for moderate pain. Patient not taking: Reported on 08/31/2016 08/02/16   Thurnell Lose, MD  polyethylene glycol [facility] / Floria Raveling) packet Take 17 g by mouth daily as needed for mild constipation or moderate constipation. Patient not taking: Reported on 08/31/2016 08/02/16   Thurnell Lose, MD  protein supplement (RESOURCE BENEPROTEIN) POWD Take 6 g by mouth 3 (three) times daily with meals. Patient not taking: Reported on 08/31/2016 08/02/16   Thurnell Lose, MD    Physical Exam: Vitals:   09/01/16 0300 09/01/16 0315 09/01/16 0330 09/01/16 0400  BP: 118/56 138/68 133/64 (!) 146/67  Pulse: 96 106 99 97  Resp: 18 19 18 18   Temp:    (!) 100.5 F (38.1 C)  TempSrc:    Oral  SpO2: 100% 100% 99% 100%  Weight:      Height:          Constitutional: Not in distress. Vitals:   09/01/16 0300 09/01/16 0315 09/01/16 0330 09/01/16 0400  BP: 118/56 138/68 133/64 (!) 146/67  Pulse: 96 106 99 97  Resp: 18 19 18 18   Temp:    (!) 100.5 F (38.1 C)  TempSrc:    Oral  SpO2: 100% 100% 99% 100%  Weight:      Height:       Eyes: Anicteric. No pallor. ENMT: No discharge from the ears eyes nose or mouth. Neck: No mass felt. No neck rigidity. Respiratory: No rhonchi or crepitations. Cardiovascular: S1 and S2 heard. Abdomen: Percutaneous drain on the right upper quadrant seen. Soft nontender bowel sounds present. Musculoskeletal: No edema. Skin: No rash. Neurologic: Alert awake oriented to time place and person. Moves all extremities. Psychiatric: Appears normal.   Labs on Admission: I have personally reviewed following labs and imaging studies  CBC:  Recent Labs Lab 08/28/16 0949 08/31/16 2223  WBC 8.9 13.3*  NEUTROABS 6.6* 11.0*  HGB 9.7* 9.4*  HCT 29.5* 30.3*    MCV 90.2 93.2  PLT 90* 78*   Basic Metabolic Panel:  Recent Labs Lab 08/28/16 0949 08/31/16 2223  NA 137 133*  K 4.0 4.2  CL  --  98*  CO2 23 25  GLUCOSE 185* 176*  BUN 25.7 12  CREATININE 2.6* 1.73*  CALCIUM 9.2 8.9   GFR: Estimated Creatinine Clearance: 37 mL/min (by C-G formula based on SCr of 1.73 mg/dL (H)). Liver Function Tests:  Recent Labs Lab 08/28/16 0949 08/31/16 2223  AST 12 21  ALT 12 16*  ALKPHOS 111 113  BILITOT 0.55 0.5  PROT 7.6 7.3  ALBUMIN 2.6* 2.8*   No results for input(s): LIPASE, AMYLASE in the last 168 hours. No results for input(s): AMMONIA in the last 168 hours. Coagulation Profile: No results for input(s): INR, PROTIME in the last 168 hours. Cardiac Enzymes: No results for input(s): CKTOTAL, CKMB, CKMBINDEX, TROPONINI in the last 168 hours. BNP (last 3 results) No results for input(s): PROBNP in the last 8760 hours. HbA1C: No results for input(s): HGBA1C in the last 72 hours. CBG:  Recent Labs Lab 09/01/16 0402  GLUCAP 83   Lipid Profile: No results for input(s): CHOL, HDL, LDLCALC, TRIG, CHOLHDL, LDLDIRECT in the last 72 hours. Thyroid Function Tests: No results for input(s): TSH, T4TOTAL, FREET4, T3FREE, THYROIDAB in the last 72 hours. Anemia Panel: No results for input(s): VITAMINB12, FOLATE, FERRITIN, TIBC, IRON, RETICCTPCT in the last 72 hours. Urine analysis:    Component Value Date/Time   COLORURINE YELLOW 09/01/2016 [place] 09/01/2016 0237   LABSPEC 1.012 09/01/2016 0237   PHURINE 7.0 09/01/2016 0237   GLUCOSEU NEGATIVE 09/01/2016 0237   HGBUR NEGATIVE 09/01/2016 0237   BILIRUBINUR NEGATIVE 09/01/2016 0237   KETONESUR NEGATIVE 09/01/2016 0237   PROTEINUR 100 (A) 09/01/2016 0237   NITRITE NEGATIVE 09/01/2016 0237   LEUKOCYTESUR NEGATIVE 09/01/2016 0237   Sepsis Labs: @LABRCNTIP (procalcitonin:4,lacticidven:4) )No results found for this or any previous visit (from the past 240 hour(s)).    Radiological Exams on Admission: Dg Chest 2 View  Result Date: 08/31/2016 CLINICAL DATA:  Fever today. Cough for 1 month. Chemotherapy patient. Nonsmoker. EXAM: CHEST  2 VIEW COMPARISON:  07/13/2016 FINDINGS: Right central venous catheter with tip over the cavoatrial junction region. No pneumothorax. Slightly shallow inspiration. Normal heart size and pulmonary vascularity. Emphysematous  changes in the lungs. Increased density in the left lung base posteriorly likely representing left lower lobe collapse. This could be caused by pneumonia or endobronchial lesion. Pleural effusions have improved since previous study but remain present bilaterally. Calcification of the aorta. Degenerative changes in the spine. IMPRESSION: Left lower lobe collapse. Can't exclude pneumonia or endobronchial lesion. Emphysematous changes in the lungs. Electronically Signed   By: Lucienne Capers M.D.   On: 08/31/2016 23:14   Ir Sinus/fist Tube Chk-non Gi  Result Date: 08/30/2016 CLINICAL DATA:  Recent a calculus cholecystitis, recent sepsis, multiple myeloma EXAM: SINUS TRACT INJECTION/FISTULOGRAM ANESTHESIA/SEDATION: None. MEDICATIONS: None. CONTRAST:  97m ISOVUE-300 IOPAMIDOL (ISOVUE-300) INJECTION 61% PROCEDURE: The existing cholecystostomy catheter was injected with contrast. Fluoroscopic imaging performed. COMPLICATIONS: [id number]: Contrast injection performed of the cholecystostomy. Catheter is within the collapsed gallbladder. Mixing artifact within the gallbladder compatible with gallbladder sludge. Cystic duct is patent. There is opacification of the intrahepatic ducts, biliary confluence, common hepatic duct. Common bile duct has an abrupt obstruction distally. Distal impacted choledocholithiasis not excluded. Further distention of the biliary system continues to demonstrate obstruction. At this point, the patient was uncomfortable and the injection was stopped. IMPRESSION: Abrupt distal CBD obstruction,  suspicious for impacted choledocholithiasis versus underlying obstructing lesion or mass. These results were called by telephone at the time of interpretation on 08/30/2016 at 12:30pm to Dr. EGreer Pickerel, who verbally acknowledged these results. Electronically Signed   By: MJerilynn Mages  Shick M.D.   On: 08/30/2016 14:27   [place]dom[place] Limited Ruq  Result Date: 09/01/2016 CLINICAL DATA:  Acute onset of right upper quadrant abdominal pain and fever. Initial encounter. EXAM: [place]DOM[place] LIMITED - RIGHT UPPER QUADRANT COMPARISON:  Right upper quadrant ultrasound performed 07/13/2016 FINDINGS: Gallbladder: No gallstones or wall thickening visualized. The gallbladder is relatively decompressed and difficult to characterize. The patient's cholecystostomy tube is partially visualized. No sonographic Murphy sign noted by sonographer. Common bile duct: Diameter: 0.6 cm, within normal limits in caliber. Liver: No focal lesion identified. Within normal limits in parenchymal echogenicity. IMPRESSION: 1. No acute abnormality seen at the right upper quadrant. 2. Gallbladder relatively decompressed. Cholecystostomy tube is partially characterized. Electronically Signed   By: JGarald BaldingM.D.   On: 09/01/2016 06:37     Assessment/Plan Principal Problem:   HCAP (healthcare-associated pneumonia) Active Problems:   ESRD on dialysis ([place]   Multiple [place] not having achieved remission ([place]   Thrombocytopenia ([facility])   Anemia of renal disease   Steroid-induced hyperglycemia    1. Healthcare associated pneumonia - suspect patient's fever probably from healthcare associated pneumonia since patient is having persistent productive cough. However since patient also has dialysis catheter need to follow blood cultures and has had recent bacteremia from acalculus cholecystitis. Sonogram of the abdomen was done and as per the ER physician it was showing patent stent and drain, official results to be followed. Check UA. 2. ESRD on  hemodialysis on Tuesday Thursday and Saturday - consult nephrology. 3. Steroid-induced hyperglycemia - on sliding scale coverage. 4. Multiple myeloma on chemotherapy per oncology. 5. Anemia and thrombocytopenia probably secondary to multiple myeloma and also anemia could be from ESRD. 6. Recent admission for acalculus cholecystitis requiring percutaneous drain placement.   DVT prophylaxis: SCDs. Code Status: Full code.  Family Communication: Family at the bedside.  Disposition Plan: Home.  Consults called: None.  Admission status: Inpatient. Likely stay 2 days.    KRise PatienceMD [facility] Pager [phone]  If 7PM-7AM, please contact night-coverage  [email or web address] Password [facility]  09/01/2016, 6:46 AM

## 2016-09-02 LAB — GLUCOSE, CAPILLARY
Glucose-Capillary: 90 mg/dL (ref 65–99)
Glucose-Capillary: 182 mg/dL — ABNORMAL HIGH (ref 65–99)
Glucose-Capillary: 113 mg/dL — ABNORMAL HIGH (ref 65–99)

## 2016-09-02 LAB — CBC
HCT: 29.6 % — ABNORMAL LOW (ref 39.0–52.0)
Hemoglobin: 9.3 g/dL — ABNORMAL LOW (ref 13.0–17.0)
MCH: 29 pg (ref 26.0–34.0)
MCHC: 31.4 g/dL (ref 30.0–36.0)
MCV: 92.2 fL (ref 78.0–100.0)
PLATELETS: 75 10*3/uL — AB (ref 150–400)
RBC: 3.21 MIL/uL — AB (ref 4.22–5.81)
RDW: 14.9 % (ref 11.5–15.5)
WBC: 9.2 10*3/uL (ref 4.0–10.5)

## 2016-09-02 LAB — URINE CULTURE

## 2016-09-02 LAB — BASIC METABOLIC PANEL
Anion gap: 11 (ref 5–15)
BUN: 29 mg/dL — AB (ref 6–20)
CO2: 16 mmol/L — ABNORMAL LOW (ref 22–32)
CREATININE: 2.81 mg/dL — AB (ref 0.61–1.24)
Calcium: 9 mg/dL (ref 8.9–10.3)
Chloride: 106 mmol/L (ref 101–111)
GFR calc Af Amer: 25 mL/min — ABNORMAL LOW (ref >60)
GFR, EST NON AFRICAN AMERICAN: 22 mL/min — AB (ref >60)
Glucose, Bld: 194 mg/dL — ABNORMAL HIGH (ref 65–99)
POTASSIUM: 3.9 mmol/L (ref 3.5–5.1)
SODIUM: 133 mmol/L — AB (ref 135–145)

## 2016-09-02 MED ORDER — HEPARIN SODIUM (PORCINE) 1000 UNIT/ML DIALYSIS: 6000.0000 [IU] | Freq: Once | INTRAMUSCULAR | Status: DC

## 2016-09-02 MED ORDER — ALTEPLASE 2 MG IJ SOLR: 2.0000 mg | Freq: Once | INTRAMUSCULAR | Status: DC | PRN

## 2016-09-02 MED ORDER — SODIUM CHLORIDE 0.9 % IV SOLN: 100.0000 mL | INTRAVENOUS | Status: DC | PRN

## 2016-09-02 MED ORDER — PENTAFLUOROPROP-TETRAFLUOROETH EX AERO: 1.0000 "application " | INHALATION_SPRAY | CUTANEOUS | Status: DC | PRN

## 2016-09-02 MED ORDER — LIDOCAINE-PRILOCAINE 2.5-2.5 % EX CREA: 1.0000 "application " | TOPICAL_CREAM | CUTANEOUS | Status: DC | PRN

## 2016-09-02 MED ORDER — LOPERAMIDE HCL 2 MG PO CAPS
2.0000 mg | ORAL_CAPSULE | ORAL | Status: DC | PRN
  Administered 2016-09-02: 2 mg via ORAL
  Filled 2016-09-02: qty 1

## 2016-09-02 MED ORDER — HEPARIN SODIUM (PORCINE) 1000 UNIT/ML DIALYSIS: 3500.0000 [IU] | INTRAMUSCULAR | Status: DC | PRN

## 2016-09-02 MED ORDER — HEPARIN SODIUM (PORCINE) 1000 UNIT/ML DIALYSIS: 1000.0000 [IU] | INTRAMUSCULAR | Status: DC | PRN

## 2016-09-02 MED ORDER — SACCHAROMYCES BOULARDII 250 MG PO CAPS
250.0000 mg | ORAL_CAPSULE | Freq: Two times a day (BID) | ORAL | Status: DC
  Administered 2016-09-02 – 2016-09-03 (×3): 250 mg via ORAL
  Filled 2016-09-02 (×3): qty 1

## 2016-09-02 MED ORDER — LIDOCAINE HCL (PF) 1 % IJ SOLN: 5.0000 mL | INTRAMUSCULAR | Status: DC | PRN

## 2016-09-02 MED ORDER — NEPRO/CARBSTEADY PO LIQD
237.0000 mL | Freq: Two times a day (BID) | ORAL | Status: DC
  Administered 2016-09-02: 237 mL via ORAL
  Filled 2016-09-02 (×2): qty 237

## 2016-09-02 MED ORDER — DARBEPOETIN ALFA 60 MCG/0.3ML IJ SOSY: 60.0000 ug | PREFILLED_SYRINGE | INTRAMUSCULAR | Status: DC

## 2016-09-02 MED ORDER — VANCOMYCIN HCL IN DEXTROSE 750-5 MG/150ML-% IV SOLN
INTRAVENOUS | Status: AC
  Administered 2016-09-02: 750 mg via INTRAVENOUS
  Filled 2016-09-02: qty 150

## 2016-09-02 NOTE — Progress Notes (Signed)
PROGRESS NOTE  Thomas [name] LGX:[id number] DOB: Jul 29, 1946 DOA: 08/31/2016 PCP: No PCP Per Patient   LOS: 1 day   Brief Narrative: 70 y.o. male with multiple myeloma on chemotherapy not in remission, ESRD on hemodialysis on Tuesday Thursday and Saturday, hyperglycemia secondary to steroids who was admitted last month for acalculous cholecystitis and had perc drain placed which is still in place started experiencing fever and chills last night at home. Patient had dialysis followed by chemotherapy yesterday. Patient's family states patient had a fever of around 103F. In the ER patient's chest x-ray shows possible left lower lobe collapse with possible infiltrates. Patient has been having active productive cough for last few weeks.  Assessment & Plan: Principal Problem:   HCAP (healthcare-associated pneumonia) Active Problems:   ESRD on dialysis ([place])   Multiple myeloma not having achieved remission ([place])   Thrombocytopenia ([place])   Anemia of renal disease   Steroid-induced hyperglycemia   Healthcare associated pneumonia  - suspect patient's fever probably from healthcare associated pneumonia since patient is having persistent productive cough.  - continue IV antibiotics for today, monitor blood cultures, NGTD - home tomorrow if cultures remain negative and he is afebrile.   ESRD on hemodialysis on Tuesday Thursday and Saturday - consulted nephrology, HD today   Steroid-induced hyperglycemia - on sliding scale coverage.  Multiple myeloma on chemotherapy per oncology.  Anemia and thrombocytopenia probably secondary to multiple myeloma and also anemia could be from ESRD. - stable, platelets improving  Recent admission for acalculus cholecystitis requiring percutaneous drain placement. - fistulogram 2 days ago with "Abrupt distal CBD obstruction, suspicious for impacted choledocholithiasis versus underlying obstructing lesion or mass". LFTs normal, patient is asymptomatic, drain in place  and working, he has outpatient follow up with GI on Monday.    DVT prophylaxis: SCD Code Status: Full Family Communication: seen in HD, no family bedside Disposition Plan: home when ready, likely 1 day  Consultants:  Nephrology   Procedures:   None   Antimicrobials:  Vancomycin 9/22 >> 9/22  Cefepime 9/22 >>   Subjective: - seen in HD, asking to go home, feeling well  Objective: Vitals:   09/02/16 1000 09/02/16 1030 09/02/16 1059 09/02/16 1140  BP: 139/79 (!) 145/81 (!) 152/78 132/71  Pulse: 88 87 88 92  Resp: (!) _0 Temp:   98.3 F (36.8 C) 98 F (36.7 C)  TempSrc:   Oral Oral  SpO2:   98% 99%  Weight:   64.8 kg (142 lb 13.7 oz)   Height:        Intake/Output Summary (Last 24 hours) at 09/02/16 1142 Last data filed at 09/02/16 1059  Gross per 24 hour  Intake              560 ml  Output              450 ml  Net              110 ml   Filed Weights   09/01/16 2052 09/02/16 0700 09/02/16 1059  Weight: 64.2 kg (141 lb 8.6 oz) 64.7 kg (142 lb 10.2 oz) 64.8 kg (142 lb 13.7 oz)    Examination: Constitutional: NAD Vitals:   09/02/16 1000 09/02/16 1030 09/02/16 1059 09/02/16 1140  BP: 139/79 (!) 145/81 (!) 152/78 132/71  Pulse: 88 87 88 92  Resp: (!) _1 Temp:   98.3 F (36.8 C) 98 F (36.7 C)  TempSrc:   Oral Oral  SpO2:   98% 99%  Weight:   64.8 kg (142 lb 13.7 oz)   Height:       Eyes: PERRL Respiratory: rhonchi on left lung field, no wheezing, no crackles Cardiovascular: Regular rate and rhythm, no murmurs / rubs / gallops. No LE edema.  Abdomen: no tenderness. Bowel sounds positive. Drain in place Musculoskeletal: no clubbing / cyanosis.  Skin: no rashes, lesions, ulcers. No induration Neurologic: non focal    Data Reviewed: I have personally reviewed following labs and imaging studies  CBC:  Recent Labs Lab 08/28/16 0949 08/31/16 2223 09/01/16 0900 09/02/16 0435  WBC 8.9 13.3* 9.2 9.2  NEUTROABS 6.6* 11.0* 7.1  --     HGB 9.7* 9.4* 8.5* 9.3*  HCT 29.5* 30.3* 27.4* 29.6*  MCV 90.2 93.2 92.6 92.2  PLT 90* 78* 60* 75*   Basic Metabolic Panel:  Recent Labs Lab 08/28/16 0949 08/31/16 2223 09/01/16 0900 09/02/16 0435  NA 137 133* 134* 133*  K 4.0 4.2 3.7 3.9  CL  --  98* 101 106  CO2 _0 16*  GLUCOSE 185* 176* 82 194*  BUN 25._1 29*  CREATININE 2.6* 1.73* 2.19* 2.81*  CALCIUM 9.2 8.9 8.6* 9.0   GFR: Estimated Creatinine Clearance: 23.1 mL/min (by C-G formula based on SCr of 2.81 mg/dL (H)). Liver Function Tests:  Recent Labs Lab 08/28/16 0949 08/31/16 2223 09/01/16 0900  AST 12 21 36  ALT 12 16* 14*  ALKPHOS 111 113 92  BILITOT 0.55 0.5 0.5  PROT 7.6 7.3 6.9  ALBUMIN 2.6* 2.8* 2.6*   No results for input(s): LIPASE, AMYLASE in the last 168 hours. No results for input(s): AMMONIA in the last 168 hours. Coagulation Profile: No results for input(s): INR, PROTIME in the last 168 hours. Cardiac Enzymes: No results for input(s): CKTOTAL, CKMB, CKMBINDEX, TROPONINI in the last 168 hours. BNP (last 3 results) No results for input(s): PROBNP in the last 8760 hours. HbA1C: No results for input(s): HGBA1C in the last 72 hours. CBG:  Recent Labs Lab 09/01/16 0402 09/01/16 0738 09/01/16 1302 09/01/16 1638 09/01/16 2040  GLUCAP 83 80 109* 131* 210*   Lipid Profile: No results for input(s): CHOL, HDL, LDLCALC, TRIG, CHOLHDL, LDLDIRECT in the last 72 hours. Thyroid Function Tests: No results for input(s): TSH, T4TOTAL, FREET4, T3FREE, THYROIDAB in the last 72 hours. Anemia Panel: No results for input(s): VITAMINB12, FOLATE, FERRITIN, TIBC, IRON, RETICCTPCT in the last 72 hours. Urine analysis:    Component Value Date/Time   COLORURINE YELLOW 09/01/2016 0237   APPEARANCEUR CLEAR 09/01/2016 0237   LABSPEC 1.012 09/01/2016 0237   PHURINE 7.0 09/01/2016 0237   GLUCOSEU NEGATIVE 09/01/2016 0237   HGBUR NEGATIVE 09/01/2016 0237   BILIRUBINUR NEGATIVE 09/01/2016 0237    KETONESUR NEGATIVE 09/01/2016 0237   PROTEINUR 100 (A) 09/01/2016 0237   NITRITE NEGATIVE 09/01/2016 0237   LEUKOCYTESUR NEGATIVE 09/01/2016 0237   Sepsis Labs: Invalid input(s): PROCALCITONIN, LACTICIDVEN  Recent Results (from the past 240 hour(s))  Culture, blood (Routine X 2)     Status: None (Preliminary result)   Collection Time: 08/31/16 10:15 PM  Result Value Ref Range Status   Specimen Description BLOOD RIGHT FOREARM  Final   Special Requests BOTTLES DRAWN AEROBIC ONLY 5CC  Final   Culture NO GROWTH < 24 HOURS  Final   Report Status PENDING  Incomplete  Culture, blood (Routine X 2)     Status: None (Preliminary result)   Collection Time: 08/31/16 10:23 PM  Result Value Ref Range Status   Specimen Description BLOOD RIGHT HAND  Final   Special Requests BOTTLES DRAWN AEROBIC AND ANAEROBIC 5CC  Final   Culture NO GROWTH < 24 HOURS  Final   Report Status PENDING  Incomplete  Urine culture     Status: Abnormal   Collection Time: 09/01/16  2:37 AM  Result Value Ref Range Status   Specimen Description URINE, RANDOM  Final   Special Requests NONE  Final   Culture MULTIPLE SPECIES PRESENT, SUGGEST RECOLLECTION (A)  Final   Report Status 09/02/2016 FINAL  Final  MRSA PCR Screening     Status: None   Collection Time: 09/01/16  4:36 AM  Result Value Ref Range Status   MRSA by PCR NEGATIVE NEGATIVE Final    Comment:        The GeneXpert MRSA Assay (FDA approved for NASAL specimens only), is one component of a comprehensive MRSA colonization surveillance program. It is not intended to diagnose MRSA infection nor to guide or monitor treatment for MRSA infections.   C difficile quick scan w PCR reflex     Status: None   Collection Time: 09/01/16  7:40 PM  Result Value Ref Range Status   C Diff antigen NEGATIVE NEGATIVE Final   C Diff toxin NEGATIVE NEGATIVE Final   C Diff interpretation No C. difficile detected.  Final      Radiology Studies: Dg Chest 2 View  Result  Date: 08/31/2016 CLINICAL DATA:  Fever today. Cough for 1 month. Chemotherapy patient. Nonsmoker. EXAM: CHEST  2 VIEW COMPARISON:  07/13/2016 FINDINGS: Right central venous catheter with tip over the cavoatrial junction region. No pneumothorax. Slightly shallow inspiration. Normal heart size and pulmonary vascularity. Emphysematous changes in the lungs. Increased density in the left lung base posteriorly likely representing left lower lobe collapse. This could be caused by pneumonia or endobronchial lesion. Pleural effusions have improved since previous study but remain present bilaterally. Calcification of the aorta. Degenerative changes in the spine. IMPRESSION: Left lower lobe collapse. Can't exclude pneumonia or endobronchial lesion. Emphysematous changes in the lungs. Electronically Signed   By: Lucienne Capers M.D.   On: 08/31/2016 23:14   US Abdomen Limited Ruq  Result Date: 09/01/2016 CLINICAL DATA:  Acute onset of right upper quadrant abdominal pain and fever. Initial encounter. EXAM: US ABDOMEN LIMITED - RIGHT UPPER QUADRANT COMPARISON:  Right upper quadrant ultrasound performed 07/13/2016 FINDINGS: Gallbladder: No gallstones or wall thickening visualized. The gallbladder is relatively decompressed and difficult to characterize. The patient's cholecystostomy tube is partially visualized. No sonographic Murphy sign noted by sonographer. Common bile duct: Diameter: 0.6 cm, within normal limits in caliber. Liver: No focal lesion identified. Within normal limits in parenchymal echogenicity. IMPRESSION: 1. No acute abnormality seen at the right upper quadrant. 2. Gallbladder relatively decompressed. Cholecystostomy tube is partially characterized. Electronically Signed   By: Garald Balding M.D.   On: 09/01/2016 06:37     Scheduled Meds: . acyclovir  200 mg Oral BID  . calcium acetate  667 mg Oral BID WC  . ceFEPime (MAXIPIME) IV  1 g Intravenous Q24H  . [START ON 09/07/2016] darbepoetin (ARANESP)  injection - DIALYSIS  60 mcg Intravenous Q Thu-HD  . dexamethasone  2 mg Oral Q breakfast  . feeding supplement (NEPRO CARB STEADY)  237 mL Oral BID BM  . insulin aspart  0-9 Units Subcutaneous TID WC  . loratadine  10 mg Oral Daily  . midodrine  10 mg Oral TID WC  .  multivitamin  1 tablet Oral QHS  . pantoprazole  40 mg Oral Daily  . protein supplement  1 scoop Oral TID WC  . saccharomyces boulardii  250 mg Oral BID  . triamcinolone  2 spray Nasal Daily  . vancomycin  750 mg Intravenous Q T,Th,Sa-HD  . cyanocobalamin  1,000 mcg Oral Daily  . [START ON 09/06/2016] Vitamin D (Ergocalciferol)  50,000 Units Oral Weekly   Continuous Infusions:    Marzetta Board, MD, PhD [facility] Pager [phone]  If 7PM-7AM, please contact night-coverage [email or web address] Password [facility] 09/02/2016, 11:42 AM

## 2016-09-02 NOTE — Progress Notes (Signed)
[facility] Progress Note   Dialysis Orders: TTS SW  4h  3K/2.25 bath  64kg  Hep 7300  right IJ and left lower AVF Aranesp 50 per week - last dose 9/21  Assessment/Plan: 1. Fevers - RLL infiltrate/diarrhea BC pending - C diff neg - abx per primary. Afebrile - WBC coming down 2. ESRD  Secondary to MM on TTS HD 3. Anemia - hgb 9.3 -continue weekly Aranesp tsat 34% Aug - 4. Secondary hyperparathyroidism - iPTH < 200 no VDRA Ca ok 5. HTN/volume - keeping even - BP started at 160s - now into 120s without pulling fluid 6. Nutrition alb 2.6 liberalized diet to promote intake/vits/suppl 7. MM-  8. Thrombocytopenia - 60 - 90 range; he needs lower outpt dose heparin dose after d/c  - especially given low plts - have notified HD unit to change to tight at discharge  Myriam Jacobson, PA-C [place] [phone] 09/02/2016,8:30 AM  LOS: 1 day   Pt seen, examined and agree w A/P as above.  [name] MD [facility] pager [phone]    cell [phone] 09/02/2016, 11:22 AM    Subjective:   Last diarrhea last night - cough a little better  Objective Vitals:   09/02/16 0712 09/02/16 0713 09/02/16 0730 09/02/16 0800  BP: (!) 171/88 (!) 171/88 (!) 165/89 130/74  Pulse: 85 85 81 92  Resp: 20 20 (!) 22 (!) 22  Temp:      TempSrc:      SpO2:      Weight:      Height:       Physical Exam General: NAD supine on HD Heart: RRR Lungs: soft insp wheeze left base; slight coarse BS on right Abdomen: soft C'tube RUQ Extremities:no edema Dialysis Access: right IJ and left lower AVF  + bruit mautring    Additional Objective Labs: Basic Metabolic Panel:  Recent Labs Lab 08/31/16 2223 09/01/16 0900 09/02/16 0435  NA 133* 134* 133*  K 4.2 3.7 3.9  CL 98* 101 106  CO2 25 24 16*  GLUCOSE 176* 82 194*  BUN 12 18 29*  CREATININE 1.73* 2.19* 2.81*  CALCIUM 8.9 8.6* 9.0   Liver Function Tests:  Recent Labs Lab 08/28/16 0949  08/31/16 2223 09/01/16 0900  AST 12 21 36  ALT 12 16* 14*  ALKPHOS 111 113 92  BILITOT 0.55 0.5 0.5  PROT 7.6 7.3 6.9  ALBUMIN 2.6* 2.8* 2.6*   CBC:  Recent Labs Lab 08/28/16 0949 08/31/16 2223 09/01/16 0900 09/02/16 0435  WBC 8.9 13.3* 9.2 9.2  NEUTROABS 6.6* 11.0* 7.1  --   HGB 9.7* 9.4* 8.5* 9.3*  HCT 29.5* 30.3* 27.4* 29.6*  MCV 90.2 93.2 92.6 92.2  PLT 90* 78* 60* 75*   Blood Culture    Component Value Date/Time   SDES BLOOD RIGHT HAND 08/31/2016 2223   SPECREQUEST BOTTLES DRAWN AEROBIC AND ANAEROBIC 5CC 08/31/2016 2223   CULT NO GROWTH < 24 HOURS 08/31/2016 2223   REPTSTATUS PENDING 08/31/2016 2223    CBG:  Recent Labs Lab 09/01/16 0402 09/01/16 0738 09/01/16 1302 09/01/16 1638 09/01/16 2040  GLUCAP 83 80 109* 131* 210*   Lab Results  Component Value Date   INR 1.65 07/17/2016   INR 1.46 05/11/2016   INR 1.40 05/10/2016   Studies/Results: Dg Chest 2 View  Result Date: 08/31/2016 CLINICAL DATA:  Fever today. Cough for 1 month. Chemotherapy patient. Nonsmoker. EXAM: CHEST  2 VIEW COMPARISON:  07/13/2016 FINDINGS:  Right central venous catheter with tip over the cavoatrial junction region. No pneumothorax. Slightly shallow inspiration. Normal heart size and pulmonary vascularity. Emphysematous changes in the lungs. Increased density in the left lung base posteriorly likely representing left lower lobe collapse. This could be caused by pneumonia or endobronchial lesion. Pleural effusions have improved since previous study but remain present bilaterally. Calcification of the aorta. Degenerative changes in the spine. IMPRESSION: Left lower lobe collapse. Can't exclude pneumonia or endobronchial lesion. Emphysematous changes in the lungs. Electronically Signed   By: Lucienne Capers M.D.   On: 08/31/2016 23:14   US Abdomen Limited Ruq  Result Date: 09/01/2016 CLINICAL DATA:  Acute onset of right upper quadrant abdominal pain and fever. Initial encounter. EXAM:  US ABDOMEN LIMITED - RIGHT UPPER QUADRANT COMPARISON:  Right upper quadrant ultrasound performed 07/13/2016 FINDINGS: Gallbladder: No gallstones or wall thickening visualized. The gallbladder is relatively decompressed and difficult to characterize. The patient's cholecystostomy tube is partially visualized. No sonographic Murphy sign noted by sonographer. Common bile duct: Diameter: 0.6 cm, within normal limits in caliber. Liver: No focal lesion identified. Within normal limits in parenchymal echogenicity. IMPRESSION: 1. No acute abnormality seen at the right upper quadrant. 2. Gallbladder relatively decompressed. Cholecystostomy tube is partially characterized. Electronically Signed   By: Garald Balding M.D.   On: 09/01/2016 06:37   Medications:   . acyclovir  200 mg Oral BID  . calcium acetate  667 mg Oral BID WC  . ceFEPime (MAXIPIME) IV  1 g Intravenous Q24H  . dexamethasone  2 mg Oral Q breakfast  . feeding supplement (NEPRO CARB STEADY)  237 mL Oral BID BM  . [START ON 09/03/2016] heparin  6,000 Units Dialysis Once in dialysis  . insulin aspart  0-9 Units Subcutaneous TID WC  . loratadine  10 mg Oral Daily  . midodrine  10 mg Oral TID WC  . multivitamin  1 tablet Oral QHS  . pantoprazole  40 mg Oral Daily  . protein supplement  1 scoop Oral TID WC  . triamcinolone  2 spray Nasal Daily  . vancomycin  750 mg Intravenous Q T,Th,Sa-HD  . cyanocobalamin  1,000 mcg Oral Daily  . [START ON 09/06/2016] Vitamin D (Ergocalciferol)  50,000 Units Oral Weekly

## 2016-09-02 NOTE — Progress Notes (Signed)
Patient continuing  to have liquid stools. cdiff test negative. Rn paged and inform  Dr. Jonette Eva about stool frequency orders given for imodium

## 2016-09-03 LAB — GLUCOSE, CAPILLARY: GLUCOSE-CAPILLARY: 79 mg/dL (ref 65–99)

## 2016-09-03 MED ORDER — LEVOFLOXACIN 500 MG PO TABS: 500.0000 mg | ORAL_TABLET | ORAL | 0 refills | Status: DC

## 2016-09-03 MED ORDER — SACCHAROMYCES BOULARDII 250 MG PO CAPS: 250.0000 mg | ORAL_CAPSULE | Freq: Two times a day (BID) | ORAL | 0 refills | Status: DC

## 2016-09-03 NOTE — Progress Notes (Signed)
[facility] Progress Note   Dialysis Orders: TTS SW 4h 3K/2.25 bath 64kg Hep 7300 right IJ and left lower AVF Aranesp 50 per week - last dose 9/21  Assessment/Plan: 1. Fevers - LLL infiltrate/diarrhea BC pending - C diff neg  Urine mult spcs. Afebrile - WBC coming down; plan levaquin for discharge 2. ESRD  Secondary to MM on TTS HD- will notify center of d/c 3. Anemia - hgb 9.3 -continue weekly Aranesp tsat 34% Aug - 4. Secondary hyperparathyroidism - iPTH < 200 no VDRA Ca ok 5. HTN/volume - kept even Sat BP started in 160s - down into 120 keeping even - post wt 64.8- no change EDW 6. Nutrition alb 2.6 liberalized diet to promote intake/vits/suppl 7. MM-  8. Thrombocytopenia - 60 - 90 range; he needs lower outpt dose heparin dose after d/c  - especially given low plts - have notified HD unit to change to tight at discharge  Myriam Jacobson, PA-C [place] [phone] 09/03/2016,8:37 AM  LOS: 2 days   Pt seen, examined and agree w A/P as above.  [name] MD [facility] pager [phone]    cell [phone] 09/03/2016, 10:27 AM    Subjective:   Going home today.  F/u tomorrow re: c'tube with Dr. Carlean Purl  Objective Vitals:   09/02/16 1140 09/02/16 1827 09/02/16 2144 09/03/16 0504  BP: 132/71 (!) 141/65 138/67 (!) 159/69  Pulse: 92 90 89 86  Resp: 17 17 17 17   Temp: 98 F (36.7 C) 98.5 F (36.9 C) 97.9 F (36.6 C) 98.4 F (36.9 C)  TempSrc: Oral Oral Oral Oral  SpO2: 99% 99% 100% 99%  Weight:   64.9 kg (143 lb 1.3 oz)   Height:       Physical Exam General: NAD Heart: RRR Lungs: LLL soft rhonchi Abdomen: drain in place, soft NT Extremities: no edema Dialysis Access: right IJ and LL AVF + bruit maturing   Additional Objective Labs: Basic Metabolic Panel:  Recent Labs Lab 08/31/16 2223 09/01/16 0900 09/02/16 0435  NA 133* 134* 133*  K 4.2 3.7 3.9  CL 98* 101 106  CO2 25 24 16*  GLUCOSE 176* 82  194*  BUN 12 18 29*  CREATININE 1.73* 2.19* 2.81*  CALCIUM 8.9 8.6* 9.0   Liver Function Tests:  Recent Labs Lab 08/28/16 0949 08/31/16 2223 09/01/16 0900  AST 12 21 36  ALT 12 16* 14*  ALKPHOS 111 113 92  BILITOT 0.55 0.5 0.5  PROT 7.6 7.3 6.9  ALBUMIN 2.6* 2.8* 2.6*   CBC:  Recent Labs Lab 08/28/16 0949 08/31/16 2223 09/01/16 0900 09/02/16 0435  WBC 8.9 13.3* 9.2 9.2  NEUTROABS 6.6* 11.0* 7.1  --   HGB 9.7* 9.4* 8.5* 9.3*  HCT 29.5* 30.3* 27.4* 29.6*  MCV 90.2 93.2 92.6 92.2  PLT 90* 78* 60* 75*   Blood Culture    Component Value Date/Time   SDES URINE, RANDOM 09/01/2016 0237   SPECREQUEST NONE 09/01/2016 0237   CULT MULTIPLE SPECIES PRESENT, SUGGEST RECOLLECTION (A) 09/01/2016 0237   REPTSTATUS 09/02/2016 FINAL 09/01/2016 0237    CBG:  Recent Labs Lab 09/01/16 2040 09/02/16 1139 09/02/16 1629 09/02/16 2109 09/03/16 0725  GLUCAP 210* 90 182* 113* 79    Lab Results  Component Value Date   INR 1.65 07/17/2016   INR 1.46 05/11/2016   INR 1.40 05/10/2016   Studies/Results: No results found. Medications:   . acyclovir  200 mg Oral BID  . calcium  acetate  667 mg Oral BID WC  . ceFEPime (MAXIPIME) IV  1 g Intravenous Q24H  . [START ON 09/07/2016] darbepoetin (ARANESP) injection - DIALYSIS  60 mcg Intravenous Q Thu-HD  . dexamethasone  2 mg Oral Q breakfast  . feeding supplement (NEPRO CARB STEADY)  237 mL Oral BID BM  . insulin aspart  0-9 Units Subcutaneous TID WC  . loratadine  10 mg Oral Daily  . midodrine  10 mg Oral TID WC  . multivitamin  1 tablet Oral QHS  . pantoprazole  40 mg Oral Daily  . protein supplement  1 scoop Oral TID WC  . saccharomyces boulardii  250 mg Oral BID  . triamcinolone  2 spray Nasal Daily  . vancomycin  750 mg Intravenous Q T,Th,Sa-HD  . cyanocobalamin  1,000 mcg Oral Daily  . [START ON 09/06/2016] Vitamin D (Ergocalciferol)  50,000 Units Oral Weekly

## 2016-09-03 NOTE — Discharge Instructions (Signed)
Follow with GI in 1 day as scheduled  Please get a complete blood count and chemistry panel checked by your Primary MD at your next visit, and again as instructed by your Primary MD. Please get your medications reviewed and adjusted by your Primary MD.  Please request your Primary MD to go over all Hospital Tests and Procedure/Radiological results at the follow up, please get all Hospital records sent to your Prim MD by signing hospital release before you go home.  If you had Pneumonia of Lung problems at the Hospital: Please get a 2 view Chest X ray done in 6-8 weeks after hospital discharge or sooner if instructed by your Primary MD.  If you have Congestive Heart Failure: Please call your Cardiologist or Primary MD anytime you have any of the following symptoms:  1) 3 pound weight gain in 24 hours or 5 pounds in 1 week  2) shortness of breath, with or without a dry hacking cough  3) swelling in the hands, feet or stomach  4) if you have to sleep on extra pillows at night in order to breathe  Follow cardiac low salt diet and 1.5 lit/day fluid restriction.  If you have diabetes Accuchecks 4 times/day, Once in AM empty stomach and then before each meal. Log in all results and show them to your primary doctor at your next visit. If any glucose reading is under 80 or above 300 call your primary MD immediately.  If you have Seizure/Convulsions/Epilepsy: Please do not drive, operate heavy machinery, participate in activities at heights or participate in high speed sports until you have seen by Primary MD or a Neurologist and advised to do so again.  If you had Gastrointestinal Bleeding: Please ask your Primary MD to check a complete blood count within one week of discharge or at your next visit. Your endoscopic/colonoscopic biopsies that are pending at the time of discharge, will also need to followed by your Primary MD.  Get Medicines reviewed and adjusted. Please take all your medications  with you for your next visit with your Primary MD  Please request your Primary MD to go over all hospital tests and procedure/radiological results at the follow up, please ask your Primary MD to get all Hospital records sent to his/her office.  If you experience worsening of your admission symptoms, develop shortness of breath, life threatening emergency, suicidal or homicidal thoughts you must seek medical attention immediately by calling 911 or calling your MD immediately  if symptoms less severe.  You must read complete instructions/literature along with all the possible adverse reactions/side effects for all the Medicines you take and that have been prescribed to you. Take any new Medicines after you have completely understood and accpet all the possible adverse reactions/side effects.   Do not drive or operate heavy machinery when taking Pain medications.   Do not take more than prescribed Pain, Sleep and Anxiety Medications  Special Instructions: If you have smoked or chewed Tobacco  in the last 2 yrs please stop smoking, stop any regular Alcohol  and or any Recreational drug use.  Wear Seat belts while driving.  Please note You were cared for by a hospitalist during your hospital stay. If you have any questions about your discharge medications or the care you received while you were in the hospital after you are discharged, you can call the unit and asked to speak with the hospitalist on call if the hospitalist that took care of you is not available. Once  you are discharged, your primary care physician will handle any further medical issues. Please note that NO REFILLS for any discharge medications will be authorized once you are discharged, as it is imperative that you return to your primary care physician (or establish a relationship with a primary care physician if you do not have one) for your aftercare needs so that they can reassess your need for medications and monitor your lab  values.  You can reach the hospitalist office at phone [phone] or fax [phone]   If you do not have a primary care physician, you can call [phone] for a physician referral.  Activity: As tolerated with Full fall precautions use walker/cane & assistance as needed  Diet: renal  Disposition Home

## 2016-09-03 NOTE — Progress Notes (Signed)
Patient discharge teaching given, including activity, diet, follow-up appoints, and medications. Patient verbalized understanding of all discharge instructions. IV access was d/c'd. Vitals are stable. Skin is intact except as charted in most recent assessments. Pt to be escorted out by NT, to be driven home by family.  Ren Grasse, MBA, BSN, RN 

## 2016-09-03 NOTE — Discharge Summary (Signed)
Physician Discharge Summary  Thomas [name] SAY:[id number] DOB: Dec 17, 1945 DOA: 08/31/2016  PCP: No PCP Per Patient  Admit date: 08/31/2016 Discharge date: 09/03/2016  Admitted From: home Disposition:  home  Recommendations for Outpatient Follow-up:  1. Follow up with GI in 1 day 2. Follow up with Dr. Alvy Bimler as scheduled 3. Continue Levaquin for 6 additional days  Home Health: none Equipment/Devices: none  Discharge Condition: stable CODE STATUS: Full Diet recommendation: renal  HPI: Thomas [name] is a 70 y.o. male with multiple myeloma on chemotherapy not in remission, ESRD on hemodialysis on Tuesday Thursday and Saturday, hyperglycemia secondary to steroids who was admitted last month for acalculous cholecystitis and had perc drain placed which is still in place started experiencing fever and chills last night at home. Patient had dialysis followed by chemotherapy yesterday. Patient's family states patient had a fever of around 103F. In the ER patient's chest x-ray shows possible left lower lobe collapse with possible infiltrates. Patient has been having active productive cough for last few weeks. Since patient also has had a history of acalculous cholecystitis right upper quadrant sonogram was done which as per the ER physician was showing patent stent and drain. Official result is to be followed. Blood cultures were sent and patient was started on empiric antibiotics for healthcare associated pneumonia.   Hospital Course: Discharge Diagnoses:  Principal Problem:   HCAP (healthcare-associated pneumonia) Active Problems:   ESRD on dialysis ([place])   Multiple myeloma not having achieved remission ([place])   Thrombocytopenia ([place])   Anemia of renal disease   Steroid-induced hyperglycemia  Healthcare associated pneumonia- suspect patient's fever probably from healthcare associated pneumonia since patient is having persistent productive cough. He was started on IV antibiotics, respiratory  status improved, he was feeling better, afebrile, cultures remained negative, and he was transitioned to Levaquin for 6 additional days.  ESRDon hemodialysis on Tuesday Thursday and Saturday - consulted nephrology, HD 9/23 Multiple myelomaon chemotherapy per oncology. Anemia and thrombocytopeniaprobably secondary to multiple myeloma and also anemia could be from ESRD. Stable, platelets improving Recent admission for acalculus cholecystitis requiring percutaneous drain placement - fistulogram 2 days ago with "Abrupt distal CBD obstruction, suspicious for impacted choledocholithiasis versus underlying obstructing lesion or mass". LFTs normal, patient is asymptomatic, drain in place and working, this can be worked up as an outpatient and is scheduled to see GI in 1 day.   Discharge Instructions     Medication List    TAKE these medications   acetaminophen 325 MG tablet Commonly known as:  TYLENOL Take 2 tablets (650 mg total) by mouth every 6 (six) hours as needed for mild pain (or Fever >/= 101).   acyclovir 200 MG capsule Commonly known as:  ZOVIRAX Take 1 capsule (200 mg total) by mouth 2 (two) times daily.   ALPRAZolam 0.25 MG tablet Commonly known as:  XANAX Take 1 tablet (0.25 mg total) by mouth 2 (two) times daily as needed for anxiety.   bisacodyl 10 MG suppository Commonly known as:  DULCOLAX Place 1 suppository (10 mg total) rectally daily as needed for moderate constipation.   calcium acetate 667 MG capsule Commonly known as:  PHOSLO Take 1 capsule (667 mg total) by mouth 2 (two) times daily with a meal.   cyanocobalamin 1000 MCG tablet Take 1 tablet (1,000 mcg total) by mouth daily.   dexamethasone 2 MG tablet Commonly known as:  DECADRON Take 2 mg by mouth daily. What changed:  Another medication with the same name was removed.  Continue taking this medication, and follow the directions you see here.   insulin aspart 100 UNIT/ML injection Commonly known as:   NOVOLOG Before each meal 3 times a day, 140-199 - 2 units, 200-250 - 4 units, 251-299 - 6 units,  300-349 - 8 units,  350 or above 10 units. Dispense syringes and needles as needed, Ok to switch to PEN if approved. Substitute to any brand approved. DX DM2, Code E11.65   levofloxacin 500 MG tablet Commonly known as:  LEVAQUIN Take 1 tablet (500 mg total) by mouth every other day.   loratadine 10 MG tablet Commonly known as:  CLARITIN Take 10 mg by mouth daily.   midodrine 10 MG tablet Commonly known as:  PROAMATINE Take 1 tablet (10 mg total) by mouth 3 (three) times daily with meals.   multivitamin Tabs tablet Take 1 tablet by mouth at bedtime.   ondansetron 8 MG tablet Commonly known as:  ZOFRAN Take 1 tablet (8 mg total) by mouth every 8 (eight) hours as needed for nausea.   oxyCODONE 5 MG immediate release tablet Commonly known as:  Oxy IR/ROXICODONE Take 1 tablet (5 mg total) by mouth every 4 (four) hours as needed for moderate pain.   pantoprazole 40 MG tablet Commonly known as:  PROTONIX Take 1 tablet (40 mg total) by mouth daily.   polyethylene glycol packet Commonly known as:  MIRALAX / GLYCOLAX Take 17 g by mouth daily as needed for mild constipation or moderate constipation.   protein supplement Powd Take 6 g by mouth 3 (three) times daily with meals.   saccharomyces boulardii 250 MG capsule Commonly known as:  FLORASTOR Take 1 capsule (250 mg total) by mouth 2 (two) times daily.   triamcinolone 55 MCG/ACT Aero nasal inhaler Commonly known as:  NASACORT AQ Place 2 sprays into the nose daily.   Vitamin D3 50000 units Caps Take 1 capsule by mouth once a week.       Allergies  Allergen Reactions  . Penicillins Rash and Other (See Comments)    Has patient had a PCN reaction causing immediate rash, facial/tongue/throat swelling, SOB or lightheadedness with hypotension: YES + Reaction causing SEVERE RASH involving MUCUS MEMBRANES or SKIN NECROSIS >>  YES Reaction that required hospitalization: NO Reaction occurring within the last 10 years: NO If all of the above answers are "NO", then may proceed with Cephalosporin use.   . Dilaudid [Hydromorphone Hcl] Other (See Comments)    hallucinations  . Fish Allergy Nausea And Vomiting  . Codeine Other (See Comments)    "MAKES ME JUMPY"    Consultations:  None   Procedures/Studies:  Dg Chest 2 View  Result Date: 08/31/2016 CLINICAL DATA:  Fever today. Cough for 1 month. Chemotherapy patient. Nonsmoker. EXAM: CHEST  2 VIEW COMPARISON:  07/13/2016 FINDINGS: Right central venous catheter with tip over the cavoatrial junction region. No pneumothorax. Slightly shallow inspiration. Normal heart size and pulmonary vascularity. Emphysematous changes in the lungs. Increased density in the left lung base posteriorly likely representing left lower lobe collapse. This could be caused by pneumonia or endobronchial lesion. Pleural effusions have improved since previous study but remain present bilaterally. Calcification of the aorta. Degenerative changes in the spine. IMPRESSION: Left lower lobe collapse. Can't exclude pneumonia or endobronchial lesion. Emphysematous changes in the lungs. Electronically Signed   By: Lucienne Capers M.D.   On: 08/31/2016 23:14   Ir Sinus/fist Tube Chk-non Gi  Result Date: 08/30/2016 CLINICAL DATA:  Recent a calculus cholecystitis,  recent sepsis, multiple myeloma EXAM: SINUS TRACT INJECTION/FISTULOGRAM ANESTHESIA/SEDATION: None. MEDICATIONS: None. CONTRAST:  70m ISOVUE-300 IOPAMIDOL (ISOVUE-300) INJECTION 61% PROCEDURE: The existing cholecystostomy catheter was injected with contrast. Fluoroscopic imaging performed. COMPLICATIONS: 045/03/8882FINDINGS: Contrast injection performed of the cholecystostomy. Catheter is within the collapsed gallbladder. Mixing artifact within the gallbladder compatible with gallbladder sludge. Cystic duct is patent. There is opacification of the  intrahepatic ducts, biliary confluence, common hepatic duct. Common bile duct has an abrupt obstruction distally. Distal impacted choledocholithiasis not excluded. Further distention of the biliary system continues to demonstrate obstruction. At this point, the patient was uncomfortable and the injection was stopped. IMPRESSION: Abrupt distal CBD obstruction, suspicious for impacted choledocholithiasis versus underlying obstructing lesion or mass. These results were called by telephone at the time of interpretation on 08/30/2016 at 12:30pm to Dr. EGreer Pickerel, who verbally acknowledged these results. Electronically Signed   By: MJerilynn Mages  Shick M.D.   On: 08/30/2016 14:27   [place]dom[place] Limited Ruq  Result Date: 09/01/2016 CLINICAL DATA:  Acute onset of right upper quadrant abdominal pain and fever. Initial encounter. EXAM: [place] LIMITED - RIGHT UPPER QUADRANT COMPARISON:  Right upper quadrant ultrasound performed 07/13/2016 FINDINGS: Gallbladder: No gallstones or wall thickening visualized. The gallbladder is relatively decompressed and difficult to characterize. The patient's cholecystostomy tube is partially visualized. No sonographic Murphy sign noted by sonographer. Common bile duct: Diameter: 0.6 cm, within normal limits in caliber. Liver: No focal lesion identified. Within normal limits in parenchymal echogenicity. IMPRESSION: 1. No acute abnormality seen at the right upper quadrant. 2. Gallbladder relatively decompressed. Cholecystostomy tube is partially characterized. Electronically Signed   By: JGarald BaldingM.D.   On: 09/01/2016 06:37      Subjective: - no chest pain, shortness of breath, no abdominal pain, nausea or vomiting.   Discharge Exam: Vitals:   09/03/16 0504 09/03/16 0945  BP: (!) 159/69 (!) 160/76  Pulse: 86 90  Resp: 17 18  Temp: 98.4 F (36.9 C) 98.3 F (36.8 C)   Vitals:   09/02/16 1827 09/02/16 2144 09/03/16 0504 09/03/16 0945  BP: (!) 141/65 138/67 (!) 159/69 (!)  160/76  Pulse: 90 89 86 90  Resp: 17 17 17 18   Temp: 98.5 F (36.9 C) 97.9 F (36.6 C) 98.4 F (36.9 C) 98.3 F (36.8 C)  TempSrc: Oral Oral Oral Oral  SpO2: 99% 100% 99% 99%  Weight:  64.9 kg (143 lb 1.3 oz)    Height:        General: Pt is alert, awake, not in acute distress Cardiovascular: RRR, S1/S2 +, no rubs, no gallops Respiratory: CTA bilaterally, no wheezing, no rhonchi Abdominal: Soft, NT, ND, bowel sounds + Extremities: no edema, no cyanosis    The results of significant diagnostics from this hospitalization (including imaging, microbiology, ancillary and laboratory) are listed below for reference.     Microbiology: Recent Results (from the past 240 hour(s))  Culture, blood (Routine X 2)     Status: None (Preliminary result)   Collection Time: 08/31/16 10:15 PM  Result Value Ref Range Status   Specimen Description BLOOD RIGHT FOREARM  Final   Special Requests BOTTLES DRAWN AEROBIC ONLY 5CC  Final   Culture NO GROWTH 3 DAYS  Final   Report Status PENDING  Incomplete  Culture, blood (Routine X 2)     Status: None (Preliminary result)   Collection Time: 08/31/16 10:23 PM  Result Value Ref Range Status   Specimen Description BLOOD RIGHT HAND  Final  Special Requests BOTTLES DRAWN AEROBIC AND ANAEROBIC 5CC  Final   Culture NO GROWTH 3 DAYS  Final   Report Status PENDING  Incomplete  Urine culture     Status: Abnormal   Collection Time: 09/01/16  2:37 AM  Result Value Ref Range Status   Specimen Description URINE, RANDOM  Final   Special Requests NONE  Final   Culture MULTIPLE SPECIES PRESENT, SUGGEST RECOLLECTION (A)  Final   Report Status 09/02/2016 FINAL  Final  MRSA PCR Screening     Status: None   Collection Time: 09/01/16  4:36 AM  Result Value Ref Range Status   MRSA by PCR NEGATIVE NEGATIVE Final    Comment:        The GeneXpert MRSA Assay (FDA approved for NASAL specimens only), is one component of a comprehensive MRSA colonization surveillance  program. It is not intended to diagnose MRSA infection nor to guide or monitor treatment for MRSA infections.   C difficile quick scan w PCR reflex     Status: None   Collection Time: 09/01/16  7:40 PM  Result Value Ref Range Status   C Diff antigen NEGATIVE NEGATIVE Final   C Diff toxin NEGATIVE NEGATIVE Final   C Diff interpretation No C. difficile detected.  Final     Labs: BNP (last 3 results) No results for input(s): BNP in the last 8760 hours. Basic Metabolic Panel:  Recent Labs Lab 08/28/16 0949 08/31/16 2223 09/01/16 0900 09/02/16 0435  NA 137 133* 134* 133*  K 4.0 4.2 3.7 3.9  CL  --  98* 101 106  CO2 23 25 24  16*  GLUCOSE 185* 176* 82 194*  BUN 25.7 12 18  29*  CREATININE 2.6* 1.73* 2.19* 2.81*  CALCIUM 9.2 8.9 8.6* 9.0   Liver Function Tests:  Recent Labs Lab 08/28/16 0949 08/31/16 2223 09/01/16 0900  AST 12 21 36  ALT 12 16* 14*  ALKPHOS 111 113 92  BILITOT 0.55 0.5 0.5  PROT 7.6 7.3 6.9  ALBUMIN 2.6* 2.8* 2.6*   No results for input(s): LIPASE, AMYLASE in the last 168 hours. No results for input(s): AMMONIA in the last 168 hours. CBC:  Recent Labs Lab 08/28/16 0949 08/31/16 2223 09/01/16 0900 09/02/16 0435  WBC 8.9 13.3* 9.2 9.2  NEUTROABS 6.6* 11.0* 7.1  --   HGB 9.7* 9.4* 8.5* 9.3*  HCT 29.5* 30.3* 27.4* 29.6*  MCV 90.2 93.2 92.6 92.2  PLT 90* 78* 60* 75*   Cardiac Enzymes: No results for input(s): CKTOTAL, CKMB, CKMBINDEX, TROPONINI in the last 168 hours. BNP: Invalid input(s): POCBNP CBG:  Recent Labs Lab 09/01/16 2040 09/02/16 1139 09/02/16 1629 09/02/16 2109 09/03/16 0725  GLUCAP 210* 90 182* 113* 79   D-Dimer No results for input(s): DDIMER in the last 72 hours. Hgb A1c No results for input(s): HGBA1C in the last 72 hours. Lipid Profile No results for input(s): CHOL, HDL, LDLCALC, TRIG, CHOLHDL, LDLDIRECT in the last 72 hours. Thyroid function studies No results for input(s): TSH, T4TOTAL, T3FREE, THYROIDAB in  the last 72 hours.  Invalid input(s): FREET3 Anemia work up No results for input(s): VITAMINB12, FOLATE, FERRITIN, TIBC, IRON, RETICCTPCT in the last 72 hours. Urinalysis    Component Value Date/Time   COLORURINE YELLOW 09/01/2016 [place] 09/01/2016 0237   LABSPEC 1.012 09/01/2016 0237   PHURINE 7.0 09/01/2016 [place] 09/01/2016 0237   HGBUR NEGATIVE 09/01/2016 0237   BILIRUBINUR NEGATIVE 09/01/2016 0237   KETONESUR NEGATIVE  09/01/2016 0237   PROTEINUR 100 (A) 09/01/2016 0237   NITRITE NEGATIVE 09/01/2016 0237   LEUKOCYTESUR NEGATIVE 09/01/2016 0237   Sepsis Labs Invalid input(s): PROCALCITONIN,  WBC,  LACTICIDVEN Microbiology Recent Results (from the past 240 hour(s))  Culture, blood (Routine X 2)     Status: None (Preliminary result)   Collection Time: 08/31/16 10:15 PM  Result Value Ref Range Status   Specimen Description BLOOD RIGHT FOREARM  Final   Special Requests BOTTLES DRAWN AEROBIC ONLY 5CC  Final   Culture NO GROWTH 3 DAYS  Final   Report Status PENDING  Incomplete  Culture, blood (Routine X 2)     Status: None (Preliminary result)   Collection Time: 08/31/16 10:23 PM  Result Value Ref Range Status   Specimen Description BLOOD RIGHT HAND  Final   Special Requests BOTTLES DRAWN AEROBIC AND ANAEROBIC 5CC  Final   Culture NO GROWTH 3 DAYS  Final   Report Status PENDING  Incomplete  Urine culture     Status: Abnormal   Collection Time: 09/01/16  2:37 AM  Result Value Ref Range Status   Specimen Description URINE, RANDOM  Final   Special Requests NONE  Final   Culture MULTIPLE SPECIES PRESENT, SUGGEST RECOLLECTION (A)  Final   Report Status 09/02/2016 FINAL  Final  MRSA PCR Screening     Status: None   Collection Time: 09/01/16  4:36 AM  Result Value Ref Range Status   MRSA by PCR NEGATIVE NEGATIVE Final    Comment:        The GeneXpert MRSA Assay (FDA approved for NASAL specimens only), is one component of a comprehensive  MRSA colonization surveillance program. It is not intended to diagnose MRSA infection nor to guide or monitor treatment for MRSA infections.   C difficile quick scan w PCR reflex     Status: None   Collection Time: 09/01/16  7:40 PM  Result Value Ref Range Status   C Diff antigen NEGATIVE NEGATIVE Final   C Diff toxin NEGATIVE NEGATIVE Final   C Diff interpretation No C. difficile detected.  Final     Time coordinating discharge: Over 30 minutes  SIGNED:  Marzetta Board, MD  [facility] 09/03/2016, 3:18 PM Pager [phone]  If 7PM-7AM, please contact night-coverage [email or web address] Password [email or web address]

## 2016-09-04 ENCOUNTER — Encounter (INDEPENDENT_AMBULATORY_CARE_PROVIDER_SITE_OTHER): Payer: Self-pay

## 2016-09-04 ENCOUNTER — Ambulatory Visit (INDEPENDENT_AMBULATORY_CARE_PROVIDER_SITE_OTHER): Payer: PPO | Admitting: Internal Medicine

## 2016-09-04 ENCOUNTER — Encounter: Payer: Self-pay | Admitting: Internal Medicine

## 2016-09-04 VITALS — BP 146/82 | HR 80 | Ht 72.02 in | Wt 143.1 lb

## 2016-09-04 DIAGNOSIS — K8050 Calculus of bile duct without cholangitis or cholecystitis without obstruction: Secondary | ICD-10-CM

## 2016-09-04 DIAGNOSIS — J189 Pneumonia, unspecified organism: Secondary | ICD-10-CM

## 2016-09-04 DIAGNOSIS — C9000 Multiple myeloma not having achieved remission: Secondary | ICD-10-CM | POA: Diagnosis not present

## 2016-09-04 DIAGNOSIS — J181 Lobar pneumonia, unspecified organism: Secondary | ICD-10-CM

## 2016-09-04 NOTE — Progress Notes (Signed)
Saw him today He was just dced with pneumonia - improving  Tube currently treating obstruction Planning to try ERCP week of 10/9

## 2016-09-04 NOTE — Patient Instructions (Signed)
  You have been scheduled for an ERCP. Please follow written instructions given to you at your visit today. If you use inhalers (even only as needed), please bring them with you on the day of your procedure.    I appreciate the opportunity to care for you. Silvano Rusk, MD, [facility]

## 2016-09-05 ENCOUNTER — Other Ambulatory Visit: Payer: Self-pay

## 2016-09-05 LAB — CULTURE, BLOOD (ROUTINE X 2)
CULTURE: NO GROWTH
Culture: NO GROWTH

## 2016-09-05 NOTE — Progress Notes (Signed)
Thomas [name] 70 y.o. 11/25/1946 [id number]  Assessment & Plan:   1. Common bile duct stone   2. Left lower lobe pneumonia   3. Multiple myeloma not having achieved remission Thomas [name])     Available information including the most recent percutaneous cholangiogram, prior MRI MRCP and previous cholangiogram suggested a stone has dropped from the gallbladder the common bile duct and is causing obstruction. Fortunately with his percutaneous cholecystostomy he is drained. He is not symptomatic from this.  He has numerous comorbidities, he is getting over pneumonia not yet over that though he is improved, he has had pancytopenia issues from chemotherapy for multiple myeloma and has had his regimen adjusted so that not so much of a problem now. He is in a weakend a debilitated state.  I am planning for an ERCP with stone extraction in a couple of weeks. I think this should be okay. I want to make sure he recovers from his pneumonia, I will notify Dr. Shane Crutch itch, I wonder if he might not need follow-up imaging with the left lower lobe collapse though he is significantly clinically improved. I don't think multiple myeloma would necessarily involve the lungs, I am a little bit concerned about the report of a left lower lobe collapse on the chest x-ray. Question needs other imaging.  The risks and benefits as well as alternatives of endoscopic procedure(s) have been discussed and reviewed. All questions answered. The patient agrees to proceed.  I appreciate the opportunity to care for this patient. I will send copies to Dr. Greer Pickerel and Dr. Heath Lark  Subjective:   Chief Complaint: Abnormal cholangiogram  HPI The patient is a very nice 70 year old white man with a percutaneous cholecystostomy tube in because of cholecystitis several months ago. On routine follow-up cholangiogram there was obstruction of the distal common bile duct suspicious for a stone though not certain. The patient denies any  abdominal pain. He was hospitalized recently and just discharged a couple of days ago with pneumonia. He had a left lower lobe collapse. He is improving significantly on antibiotics.  Allergies  Allergen Reactions  . Penicillins Rash and Other (See Comments)    Has patient had a PCN reaction causing immediate rash, facial/tongue/throat swelling, SOB or lightheadedness with hypotension: YES + Reaction causing SEVERE RASH involving MUCUS MEMBRANES or SKIN NECROSIS >> YES Reaction that required hospitalization: NO Reaction occurring within the last 10 years: NO If all of the above answers are "NO", then may proceed with Cephalosporin use.   . Dilaudid [Hydromorphone Hcl] Other (See Comments)    hallucinations  . Fish Allergy Nausea And Vomiting  . Codeine Other (See Comments)    "MAKES ME JUMPY"   Outpatient Medications Prior to Visit  Medication Sig Dispense Refill  . acetaminophen (TYLENOL) 325 MG tablet Take 2 tablets (650 mg total) by mouth every 6 (six) hours as needed for mild pain (or Fever >/= 101). 15 tablet 0  . acyclovir (ZOVIRAX) 200 MG capsule Take 1 capsule (200 mg total) by mouth 2 (two) times daily. 60 capsule 9  . ALPRAZolam (XANAX) 0.25 MG tablet Take 1 tablet (0.25 mg total) by mouth 2 (two) times daily as needed for anxiety. 10 tablet 0  . bisacodyl (DULCOLAX) 10 MG suppository Place 1 suppository (10 mg total) rectally daily as needed for moderate constipation. 12 suppository 0  . calcium acetate (PHOSLO) 667 MG capsule Take 1 capsule (667 mg total) by mouth 2 (two) times daily with a meal. 60  capsule 1  . Cholecalciferol (VITAMIN D3) 50000 units CAPS Take 1 capsule by mouth once a week.    . cyanocobalamin 1000 MCG tablet Take 1 tablet (1,000 mcg total) by mouth daily. 30 tablet 0  . dexamethasone (DECADRON) 2 MG tablet Take 2 mg by mouth daily.    . insulin aspart (NOVOLOG) 100 UNIT/ML injection Before each meal 3 times a day, 140-199 - 2 units, 200-250 - 4 units,  251-299 - 6 units,  300-349 - 8 units,  350 or above 10 units. Dispense syringes and needles as needed, Ok to switch to PEN if approved. Substitute to any brand approved. DX DM2, Code E11.65 1 vial 12  . levofloxacin (LEVAQUIN) 500 MG tablet Take 1 tablet (500 mg total) by mouth every other day. 3 tablet 0  . loratadine (CLARITIN) 10 MG tablet Take 10 mg by mouth daily.     . midodrine (PROAMATINE) 10 MG tablet Take 1 tablet (10 mg total) by mouth 3 (three) times daily with meals.    . multivitamin (RENA-VIT) TABS tablet Take 1 tablet by mouth at bedtime. 30 tablet 0  . ondansetron (ZOFRAN) 8 MG tablet Take 1 tablet (8 mg total) by mouth every 8 (eight) hours as needed for nausea. 30 tablet 3  . oxyCODONE (OXY IR/ROXICODONE) 5 MG immediate release tablet Take 1 tablet (5 mg total) by mouth every 4 (four) hours as needed for moderate pain. 10 tablet 0  . pantoprazole (PROTONIX) 40 MG tablet Take 1 tablet (40 mg total) by mouth daily. 30 tablet 1  . polyethylene glycol (MIRALAX / GLYCOLAX) packet Take 17 g by mouth daily as needed for mild constipation or moderate constipation. 14 each 0  . protein supplement (RESOURCE BENEPROTEIN) POWD Take 6 g by mouth 3 (three) times daily with meals.  0  . saccharomyces boulardii (FLORASTOR) 250 MG capsule Take 1 capsule (250 mg total) by mouth 2 (two) times daily. 10 capsule 0  . triamcinolone (NASACORT AQ) 55 MCG/ACT AERO nasal inhaler Place 2 sprays into the nose daily. 1 Inhaler 1   No facility-administered medications prior to visit.    Past Medical History:  Diagnosis Date  . Anemia    ANEMIA OF RENAL DISEASE  . Chronic kidney disease   . Compression fracture   . Dialysis patient ([place])   . Elevated LFTs 07/2016  . Gall stones   . Multiple myeloma ([place])   . Thrombocytopenia ([place]) 07/2016   Past Surgical History:  Procedure Laterality Date  . AV FISTULA PLACEMENT Left 05/24/2016   Procedure: ARTERIOVENOUS (AV) FISTULA CREATION-LEFT;  Surgeon: Conrad Skippers Corner, MD;  Location: [place];  Service: Vascular;  Laterality: Left;  . BONE BIOPSY Left 05/14/2016   Procedure: LEFT FEMORAL BIOPSY WITH INTRAOPERATIVE FROZEN SECTIONS ;  Surgeon: Rod Can, MD;  Location: [place];  Service: Orthopedics;  Laterality: Left;  . FEMUR IM NAIL Left 05/14/2016   Procedure: INTRAMEDULLARY (IM) NAIL FEMORAL;  Surgeon: Rod Can, MD;  Location: [facility];  Service: Orthopedics;  Laterality: Left;  . IR GENERIC HISTORICAL  07/17/2016   IR PERC CHOLECYSTOSTOMY 07/17/2016 Arne Cleveland, MD [facility]-INTERV RAD  . IR GENERIC HISTORICAL  08/30/2016   IR SINUS/FIST TUBE CHK-NON GI 08/30/2016 Greggory Keen, MD [facility]-INTERV RAD  . MOUTH SURGERY    . SP CHOLECYSTOMY  07/2016   cholecystostomy drain placed under [place] and fluoro   Social History   Social History  . Marital status: Married    Spouse name: N/A  .  Number of children: N/A  . Years of education: N/A   Social History Main Topics  . Smoking status: Former Research scientist (life sciences)  . Smokeless tobacco: Never Used  . Alcohol use No  . Drug use: No  . Sexual activity: Not Asked   Other Topics Concern  . None   Social History Narrative  . None   Family History  Problem Relation Age of Onset  . Diabetes Father        Review of Systems He is weak and fatigued. His cough is less productive and lasts. He is less short of breath.  Objective:   Physical Exam BP (!) 146/82 (BP Location: Right Arm, Patient Position: Sitting, Cuff Size: Normal)   Pulse 80   Ht 6' 0.02" (1.829 m)   Wt 143 lb 1.3 oz (64.9 kg)   BMI 19.39 kg/m   He is chronically ill Eyes are anicteric  some coarse breath sounds but I hear no significant rales and there are no significant decreased breath sounds or dullness to percussion Heart distant S1-S2 Abdomen there is a percutaneous cholecystostomy tube in place in the right upper quadrant without complication he is nontender He has an appropriate and upbeat mood and affect and is alert and oriented 3  I  have reviewed recent hospitalization records his chest x-ray report labs. Lab Results  Component Value Date   WBC 9.2 09/02/2016   HGB 9.3 (L) 09/02/2016   HCT 29.6 (L) 09/02/2016   MCV 92.2 09/02/2016   PLT 75 (L) 09/02/2016

## 2016-09-05 NOTE — Patient Outreach (Signed)
[facility]) Care Management   09/05/2016  Thomas [name] Oct 04, 1946 [id number]  Thomas [name] is an 70 y.o. male Arrived for scheduled home visit. Subjective:  Patient states he has been healthy until the last 3-4 months when he developed back pain and was diagnosed with multiple myeloma which involved his kidneys. Reports that he continues to have good urinary output but kidneys are not filtering.Patient reports that he has dialysis 3 times per week for 4 hours.  Patient reports he has been taking chemo and radiation and then developed problems with his gall bladder. Was diagnosed with a gall stone last week. Plans to have a procedure on 09/22/2016.  Reports that he developed fever on 9/19 and went to the emergency department and was dagnosed with pneumonia. Reports feeling tired from dialysis. Reports good family support. Patient reports that he is on steroids because of cancer treatment. Denies DM.CBG readings in the am range 92-141, lunch readings 171-344, dinner readings 225-435. Is on a sliding scale. Patient denies being active with a primary MD. States that he use to see Dr. Carola Rhine years ago and she agreed to be his doctor but he does not plan on seeing her unless it is needed. States that his oncologist Dr. Alvy Bimler is managing all his needs at this time. Patient was active with [facility] health prior to last admission but services have not started back.  Wife reports that she has spoken with Maudie Mercury and she will follow up with Maudie Mercury if no one calls her. Denies needing assistance with this.  Patient reports that he developed lower back pain again about 5 days ago and reports the back pain comes and goes. Is currently taking pain meds and muscle relaxers as needed for pain.  Patient in good spirits.   Objective:  Awake and alert, sitting in chair. No distress. Vitals:   09/05/16 1420  BP: (!) 122/58  Pulse: 97  Resp: 18  SpO2: 98%  Weight: 145 lb (65.8 kg)  Height:  1.88 m (6' 2")   Review of Systems  Constitutional: Positive for malaise/fatigue.  HENT: Negative.   Eyes: Negative.   Respiratory: Positive for cough and sputum production.        Reports productive cough with yellow sputum  Cardiovascular: Negative.   Gastrointestinal:       Reports soreness to right upper abdomen  Genitourinary:       Reports voiding about [phone] cc of urine per day. Goes to dialysis Tues, Thursday and Saturday.  Musculoskeletal: Negative for falls.       Lower back pain worsening in the last 5 days  Skin:       Reports easily brusied  Neurological: Negative.   Endo/Heme/Allergies: Bruises/bleeds easily.  Psychiatric/Behavioral: Negative.     Physical Exam  Constitutional: He is oriented to person, place, and time. He appears well-developed.  Tall and thin  Cardiovascular: Normal rate and regular rhythm.   Respiratory: Effort normal.  Scattered rhonchi  GI: Soft. Bowel sounds are normal.  Bilary drain intact to the right upper quad. Draining dark fluid. Dressing intact. Refused to remove dressing. States that it is sore. Denies redness.  Musculoskeletal: Normal range of motion. He exhibits no edema.  AV fistula in the left arm. Positive thrill  Neurological: He is alert and oriented to person, place, and time.  Skin: Skin is warm and dry.  Psychiatric: He has a normal mood and affect. His behavior is normal. Judgment and thought content normal.  Encounter Medications:   Outpatient Encounter Prescriptions as of 09/05/2016  Medication Sig  . acetaminophen (TYLENOL) 325 MG tablet Take 2 tablets (650 mg total) by mouth every 6 (six) hours as needed for mild pain (or Fever >/= 101).  Marland Kitchen acyclovir (ZOVIRAX) 200 MG capsule Take 1 capsule (200 mg total) by mouth 2 (two) times daily.  Marland Kitchen ALPRAZolam (XANAX) 0.25 MG tablet Take 1 tablet (0.25 mg total) by mouth 2 (two) times daily as needed for anxiety.  . bisacodyl (DULCOLAX) 10 MG suppository Place 1 suppository  (10 mg total) rectally daily as needed for moderate constipation.  . calcium acetate (PHOSLO) 667 MG capsule Take 1 capsule (667 mg total) by mouth 2 (two) times daily with a meal.  . Cholecalciferol (VITAMIN D3) 50000 units CAPS Take 1 capsule by mouth once a week.  . cyanocobalamin 1000 MCG tablet Take 1 tablet (1,000 mcg total) by mouth daily.  Marland Kitchen dexamethasone (DECADRON) 2 MG tablet Take 2 mg by mouth daily.  . insulin aspart (NOVOLOG) 100 UNIT/ML injection Before each meal 3 times a day, 140-199 - 2 units, 200-250 - 4 units, 251-299 - 6 units,  300-349 - 8 units,  350 or above 10 units. Dispense syringes and needles as needed, Ok to switch to PEN if approved. Substitute to any brand approved. DX DM2, Code E11.65  . levofloxacin (LEVAQUIN) 500 MG tablet Take 1 tablet (500 mg total) by mouth every other day.  . loratadine (CLARITIN) 10 MG tablet Take 10 mg by mouth daily.   . methocarbamol (ROBAXIN) 500 MG tablet Take 500 mg by mouth 4 (four) times daily. Takes a half tablet as needed  . midodrine (PROAMATINE) 10 MG tablet Take 1 tablet (10 mg total) by mouth 3 (three) times daily with meals.  . multivitamin (RENA-VIT) TABS tablet Take 1 tablet by mouth at bedtime.  . ondansetron (ZOFRAN) 8 MG tablet Take 1 tablet (8 mg total) by mouth every 8 (eight) hours as needed for nausea.  Marland Kitchen oxyCODONE (OXY IR/ROXICODONE) 5 MG immediate release tablet Take 1 tablet (5 mg total) by mouth every 4 (four) hours as needed for moderate pain.  . pantoprazole (PROTONIX) 40 MG tablet Take 1 tablet (40 mg total) by mouth daily.  . polyethylene glycol (MIRALAX / GLYCOLAX) packet Take 17 g by mouth daily as needed for mild constipation or moderate constipation.  . protein supplement (RESOURCE BENEPROTEIN) POWD Take 6 g by mouth 3 (three) times daily with meals.  . saccharomyces boulardii (FLORASTOR) 250 MG capsule Take 1 capsule (250 mg total) by mouth 2 (two) times daily.  Marland Kitchen triamcinolone (NASACORT AQ) 55 MCG/ACT AERO  nasal inhaler Place 2 sprays into the nose daily.   No facility-administered encounter medications on file as of 09/05/2016.     Functional Status:   In your present state of health, do you have any difficulty performing the following activities: 09/05/2016 09/01/2016  Hearing? Y N  Vision? N N  Difficulty concentrating or making decisions? N N  Walking or climbing stairs? N Y  Dressing or bathing? N Y  Doing errands, shopping? Tempie Donning  Preparing Food and eating ? Y -  Using the Toilet? N -  In the past six months, have you accidently leaked urine? N -  Do you have problems with loss of bowel control? Y -  Managing your Medications? N -  Managing your Finances? N -  Housekeeping or managing your Housekeeping? N -  Some recent data might be hidden  Fall/Depression Screening:    PHQ 2/9 Scores 09/05/2016 08/07/2016 06/29/2016  PHQ - 2 Score 1 3 0  PHQ- 9 Score - 6 -   Fall Risk  09/05/2016 08/07/2016 06/29/2016  Falls in the past year? No No No  Risk for fall due to : - Impaired mobility -     Assessment:   (1) reviewed [facility] consent on file. Declines changes needed. Patient has new patient packet. No magnet. Provided magnet and my contact card. Reviewed transition of care program. (2) low nutritional score/ high risk for malnutrition   (3) no advanced directives (4) home health has not restarted. (5) bilary drain to right upper quad. (6) not active with a primary MD. (7) monitoring CBG 3 times per day and covering with sliding scale as long as patient is on steroids.  Plan:  (1)  Weekly outreaches and home visit as needed. Next outreach planned for 10/3. (2) encouraged patient to eat well and drink well. Declines interest in ensure or glucerna. (3) reviewed importance of advanced directives. Provided Advance directive packet for patient. Reviewed that patient could get this document notarized at the hospital if needed. Encouraged patient to share document with the cancer center when  completed. (4) Wife declines needing my assistance with this. Reports that she will call me if she needs my help with this. (5) Encouraged patient to call GI if any concerns with this tube. Patient agreed. (6) Encouraged patient to consider getting connected with primary MD.  (7) Patient will continue to use sliding scale coverage as prescribed by md.  Review goals of patient and primary goal is to avoid readmission.   [facility] CM Care Plan Problem One   Flowsheet Row Most Recent Value  Care Plan Problem One  Recent admission for abdominal pain.  Role Documenting the Problem One  Care Management [place] for Problem One  Active  [facility] Long Term Goal (31-90 days)  Patient will report no readmission for the next 31 days.  [facility] Long Term Goal Start Date  09/05/16 Sudie Grumbling restarted due to readmission]  Interventions for Problem One Long Term Goal  Home visit completed. Encouraged patient to take medications as prescribed and call onocologist for concerns.   [facility] CM Short Term Goal #1 (0-30 days)  Wife and or patient will report improved healing for skin breakdown to buttocks in the next 2 weeks.  [facility] CM Short Term Goal #1 Start Date  08/28/16  [facility] CM Short Term Goal #1 Met Date  09/05/16  Interventions for Short Term Goal #1  Reviewed importance of pressure relieving techniques. Reviewed importance of good nutrition.   [facility] CM Short Term Goal #2 (0-30 days)  Patient and or wife will report home health nurse arriving for start of care in the next 2 days.  [facility] CM Short Term Goal #2 Start Date  09/05/16 Barrie Folk was met and then readmission. now awaiting home health]  Interventions for Short Term Goal #2  WIfe declines needing assistance with this. Encouraged wife to call me if she needs help with this.      Tomasa Rand, RN, BSN, CEN [facility] [employer] [phone]

## 2016-09-07 ENCOUNTER — Telehealth: Payer: Self-pay | Admitting: *Deleted

## 2016-09-07 ENCOUNTER — Ambulatory Visit (HOSPITAL_COMMUNITY)
Admission: RE | Admit: 2016-09-07 | Discharge: 2016-09-07 | Disposition: A | Discharge status: Home / Self Care | Payer: PPO | Source: Ambulatory Visit | Attending: Hematology and Oncology | Admitting: Hematology and Oncology

## 2016-09-07 ENCOUNTER — Encounter: Payer: Self-pay | Admitting: Hematology and Oncology

## 2016-09-07 ENCOUNTER — Ambulatory Visit (HOSPITAL_BASED_OUTPATIENT_CLINIC_OR_DEPARTMENT_OTHER): Payer: PPO | Admitting: Hematology and Oncology

## 2016-09-07 ENCOUNTER — Other Ambulatory Visit: Payer: Self-pay | Admitting: Hematology and Oncology

## 2016-09-07 ENCOUNTER — Ambulatory Visit (HOSPITAL_BASED_OUTPATIENT_CLINIC_OR_DEPARTMENT_OTHER): Payer: PPO

## 2016-09-07 DIAGNOSIS — J9811 Atelectasis: Secondary | ICD-10-CM | POA: Diagnosis not present

## 2016-09-07 DIAGNOSIS — C9000 Multiple myeloma not having achieved remission: Secondary | ICD-10-CM

## 2016-09-07 DIAGNOSIS — M4856XA Collapsed vertebra, not elsewhere classified, lumbar region, initial encounter for fracture: Secondary | ICD-10-CM | POA: Insufficient documentation

## 2016-09-07 DIAGNOSIS — E440 Moderate protein-calorie malnutrition: Secondary | ICD-10-CM

## 2016-09-07 DIAGNOSIS — G893 Neoplasm related pain (acute) (chronic): Secondary | ICD-10-CM

## 2016-09-07 DIAGNOSIS — N189 Chronic kidney disease, unspecified: Secondary | ICD-10-CM

## 2016-09-07 DIAGNOSIS — D631 Anemia in chronic kidney disease: Secondary | ICD-10-CM

## 2016-09-07 DIAGNOSIS — M4854XA Collapsed vertebra, not elsewhere classified, thoracic region, initial encounter for fracture: Secondary | ICD-10-CM | POA: Diagnosis not present

## 2016-09-07 DIAGNOSIS — Z992 Dependence on renal dialysis: Secondary | ICD-10-CM

## 2016-09-07 DIAGNOSIS — R112 Nausea with vomiting, unspecified: Secondary | ICD-10-CM

## 2016-09-07 DIAGNOSIS — K819 Cholecystitis, unspecified: Secondary | ICD-10-CM | POA: Diagnosis not present

## 2016-09-07 DIAGNOSIS — N186 End stage renal disease: Secondary | ICD-10-CM

## 2016-09-07 LAB — CBC WITH DIFFERENTIAL/PLATELET
BASO%: 0.2 % (ref 0.0–2.0)
BASOS ABS: 0 10*3/uL (ref 0.0–0.1)
EOS%: 0.1 % (ref 0.0–7.0)
Eosinophils Absolute: 0 10*3/uL (ref 0.0–0.5)
HEMATOCRIT: 29.5 % — AB (ref 38.4–49.9)
HEMOGLOBIN: 9.6 g/dL — AB (ref 13.0–17.1)
LYMPH#: 1 10*3/uL (ref 0.9–3.3)
LYMPH%: 7.7 % — ABNORMAL LOW (ref 14.0–49.0)
MCH: 28.9 pg (ref 27.2–33.4)
MCHC: 32.5 g/dL (ref 32.0–36.0)
MCV: 88.9 fL (ref 79.3–98.0)
MONO#: 1 10*3/uL — ABNORMAL HIGH (ref 0.1–0.9)
MONO%: 7.7 % (ref 0.0–14.0)
NEUT%: 84.3 % — ABNORMAL HIGH (ref 39.0–75.0)
NEUTROS ABS: 10.7 10*3/uL — AB (ref 1.5–6.5)
Platelets: 174 10*3/uL (ref 140–400)
RBC: 3.32 10*6/uL — ABNORMAL LOW (ref 4.20–5.82)
RDW: 14.7 % — AB (ref 11.0–14.6)
WBC: 12.7 10*3/uL — ABNORMAL HIGH (ref 4.0–10.3)

## 2016-09-07 LAB — COMPREHENSIVE METABOLIC PANEL
ALBUMIN: 2.7 g/dL — AB (ref 3.5–5.0)
ALK PHOS: 151 U/L — AB (ref 40–150)
ALT: 23 U/L (ref 0–55)
AST: 23 U/L (ref 5–34)
Anion Gap: 14 mEq/L — ABNORMAL HIGH (ref 3–11)
BUN: 12.2 mg/dL (ref 7.0–26.0)
CALCIUM: 9.6 mg/dL (ref 8.4–10.4)
CO2: 22 mEq/L (ref 22–29)
CREATININE: 1.9 mg/dL — AB (ref 0.7–1.3)
Chloride: 97 mEq/L — ABNORMAL LOW (ref 98–109)
EGFR: 35 mL/min/{1.73_m2} — ABNORMAL LOW (ref >90)
Glucose: 161 mg/dl — ABNORMAL HIGH (ref 70–140)
Potassium: 4.6 mEq/L (ref 3.5–5.1)
Sodium: 133 mEq/L — ABNORMAL LOW (ref 136–145)
TOTAL PROTEIN: 9.1 g/dL — AB (ref 6.4–8.3)
Total Bilirubin: 0.51 mg/dL (ref 0.20–1.20)

## 2016-09-07 NOTE — Telephone Encounter (Signed)
Wife states pt in severe lower back pain.  It has been getting worse over the past few days.  He had to cut his Dialysis appt short today due to the pain.  Pt took one oxycodone last night and vomited several hours later so he thinks it makes him nauseated.  He hasn't taken anything for pain today.  He has some robaxin at home. Instructed wife to give him one Robaxin and then take zofran and oxycodone 30 min after the zofran.  Wife verbalized understanding.  She is worried that the pain is from his cancer getting worse or from "tumors growing."  Informed her will call back after I notify Dr. Alvy Bimler.

## 2016-09-07 NOTE — Assessment & Plan Note (Signed)
The patient is very concerned about multiple myeloma causing worsening back pain. I review his recent myeloma panel with the patient and family members. Even though the serum light chain when up around 08/21/2016, overall, his serum IgG, M spike and others were improving. He will continue treatment next week as scheduled

## 2016-09-07 NOTE — Telephone Encounter (Signed)
I can see him today at 130 pm, 30 mins He needs labs and Xray

## 2016-09-07 NOTE — Assessment & Plan Note (Signed)
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 Recently, I started dexamethasone taper and I suspect that could be to cause of his flare of back pain. The patient is also reluctant to take pain medicine since yesterday because of one episode of nausea and vomiting I reviewed the x-ray with the patient and family members which show chronic compression fractures. Examination today is benign. We discussed the importance of taking pain medication regularly, heat pads and muscle relaxants

## 2016-09-07 NOTE — Progress Notes (Signed)
[place] OFFICE PROGRESS NOTE  Patient Care Team: No Pcp Per Patient as PCP - General (General Practice) Thana Ates, RN as [place], LCSW as Social Worker  SUMMARY OF ONCOLOGIC HISTORY:   Multiple myeloma not having achieved remission ([place])   05/09/2016 - 06/07/2016 Hospital Admission    The patient was hospitalized for pancytopenia, acute renal failure and back pain. He was diagnosed with multiple myeloma requiring multiple surgery and hemodialysis. He was started on chemotherapy while hospitalized      05/11/2016 Pathology Results    Accession: [id number] kidney biopsy confimed myeloma involvement      05/12/2016 Imaging    Skeletal survey showed multiple bone lesions are suggestive for metastatic bone disease and suspicious for multiple myeloma. There is imminent pathological fracture of the left femur      05/14/2016 Pathology Results    Accession: [id number] biopsy from left femur confirmed myeloma      05/16/2016 Pathology Results    Bone marrow complex cytogenetics. Please see report      05/16/2016 Imaging    MRI spine: large 6.0 x 9.0 x 7.5 cm lesion with associated extraosseous extension of tumor into the left paraspinous soft tissues at L5 with moderate to severe canal stenosis. There is L3 compression fracture      05/16/2016 Bone Marrow Biopsy    Accession: [id number] bone marrow biopsy confirmed 60% plasma cell involvement      05/18/2016 -  Chemotherapy    He was started on Velcade and dexamethasone      07/03/2016 -  Chemotherapy    Low dose Revlimid is added       INTERVAL HISTORY: Please see below for problem oriented charting. He is seen urgently today because of uncontrollable back pain, 10 out of 10. It started since his recent discharge from the hospital last week. His wife felt that he might have pulled his back muscle. He had severe cough last week when he was hospitalized and he think it  might have exacerbated his back pain. Usually, he would take pain medicine before he goes to bed at night. Last night, with severe pain, he took 1 pain medicine and become diaphoretic and threw up after that. Since then, he was afraid to take for the pain medicine. He did not take his antiemetics yesterday. He denies recent constipation. He completed a course of oral antibiotics recently. He denies recent fever or chills. No recent cough. He denies neurological deficits  REVIEW OF SYSTEMS:   Constitutional: Denies fevers, chills or abnormal weight loss Eyes: Denies blurriness of vision Ears, nose, mouth, throat, and face: Denies mucositis or sore throat Respiratory: Denies cough, dyspnea or wheezes Cardiovascular: Denies palpitation, chest discomfort or lower extremity swelling Skin: Denies abnormal skin rashes Lymphatics: Denies new lymphadenopathy or easy bruising Neurological:Denies numbness, tingling or new weaknesses Behavioral/Psych: Mood is stable, no new changes  All other systems were reviewed with the patient and are negative.  I have reviewed the past medical history, past surgical history, social history and family history with the patient and they are unchanged from previous note.  ALLERGIES:  is allergic to penicillins; dilaudid [hydromorphone hcl]; fish allergy; and codeine.  MEDICATIONS:  Current Outpatient Prescriptions  Medication Sig Dispense Refill  . acetaminophen (TYLENOL) 325 MG tablet Take 2 tablets (650 mg total) by mouth every 6 (six) hours as needed for mild pain (or Fever >/= 101). 15 tablet 0  . acyclovir (ZOVIRAX)  200 MG capsule Take 1 capsule (200 mg total) by mouth 2 (two) times daily. 60 capsule 9  . ALPRAZolam (XANAX) 0.25 MG tablet Take 1 tablet (0.25 mg total) by mouth 2 (two) times daily as needed for anxiety. 10 tablet 0  . bisacodyl (DULCOLAX) 10 MG suppository Place 1 suppository (10 mg total) rectally daily as needed for moderate constipation.  12 suppository 0  . calcium acetate (PHOSLO) 667 MG capsule Take 1 capsule (667 mg total) by mouth 2 (two) times daily with a meal. 60 capsule 1  . Cholecalciferol (VITAMIN D3) 50000 units CAPS Take 1 capsule by mouth once a week.    . cyanocobalamin 1000 MCG tablet Take 1 tablet (1,000 mcg total) by mouth daily. 30 tablet 0  . dexamethasone (DECADRON) 2 MG tablet Take 2 mg by mouth daily.    . insulin aspart (NOVOLOG) 100 UNIT/ML injection Before each meal 3 times a day, 140-199 - 2 units, 200-250 - 4 units, 251-299 - 6 units,  300-349 - 8 units,  350 or above 10 units. Dispense syringes and needles as needed, Ok to switch to PEN if approved. Substitute to any brand approved. DX DM2, Code E11.65 1 vial 12  . loratadine (CLARITIN) 10 MG tablet Take 10 mg by mouth daily.     . methocarbamol (ROBAXIN) 500 MG tablet Take 500 mg by mouth 4 (four) times daily. Takes a half tablet as needed    . midodrine (PROAMATINE) 10 MG tablet Take 1 tablet (10 mg total) by mouth 3 (three) times daily with meals.    . multivitamin (RENA-VIT) TABS tablet Take 1 tablet by mouth at bedtime. 30 tablet 0  . ondansetron (ZOFRAN) 8 MG tablet Take 1 tablet (8 mg total) by mouth every 8 (eight) hours as needed for nausea. 30 tablet 3  . oxyCODONE (OXY IR/ROXICODONE) 5 MG immediate release tablet Take 1 tablet (5 mg total) by mouth every 4 (four) hours as needed for moderate pain. 10 tablet 0  . pantoprazole (PROTONIX) 40 MG tablet Take 1 tablet (40 mg total) by mouth daily. 30 tablet 1  . polyethylene glycol (MIRALAX / GLYCOLAX) packet Take 17 g by mouth daily as needed for mild constipation or moderate constipation. 14 each 0  . protein supplement (RESOURCE BENEPROTEIN) POWD Take 6 g by mouth 3 (three) times daily with meals.  0  . saccharomyces boulardii (FLORASTOR) 250 MG capsule Take 1 capsule (250 mg total) by mouth 2 (two) times daily. 10 capsule 0  . triamcinolone (NASACORT AQ) 55 MCG/ACT AERO nasal inhaler Place 2  sprays into the nose daily. 1 Inhaler 1   No current facility-administered medications for this visit.     PHYSICAL EXAMINATION: ECOG PERFORMANCE STATUS: 2 - Symptomatic, <50% confined to bed  Vitals:   09/07/16 1257  BP: 112/61  Pulse: 98  Resp: 16  Temp: 98 F (36.7 C)   Filed Weights   09/07/16 1257  Weight: 145 lb 3.2 oz (65.9 kg)    GENERAL:alert, no distress and comfortable. He looks thin and cachectic SKIN: Noted skin bruises EYES: normal, Conjunctiva are pink and non-injected, sclera clear OROPHARYNX:no exudate, no erythema and lips, buccal mucosa, and tongue normal  NECK: supple, thyroid normal size, non-tender, without nodularity LYMPH:  no palpable lymphadenopathy in the cervical, axillary or inguinal LUNGS: clear to auscultation and percussion with normal breathing effort HEART: regular rate & rhythm and no murmurs and no lower extremity edema ABDOMEN:abdomen soft, non-tender and normal bowel sounds.  Drainage catheter in situ Musculoskeletal:no cyanosis of digits and no clubbing . Minimum pain on palpation along his spine NEURO: alert & oriented x 3 with fluent speech, no focal motor/sensory deficits  LABORATORY DATA:  I have reviewed the data as listed    Component Value Date/Time   NA 133 (L) 09/07/2016 1234   K 4.6 09/07/2016 1234   CL 106 09/02/2016 0435   CO2 22 09/07/2016 1234   GLUCOSE 161 (H) 09/07/2016 1234   BUN 12.2 09/07/2016 1234   CREATININE 1.9 (H) 09/07/2016 1234   CALCIUM 9.6 09/07/2016 1234   PROT 9.1 (H) 09/07/2016 1234   ALBUMIN 2.7 (L) 09/07/2016 1234   AST 23 09/07/2016 1234   ALT 23 09/07/2016 1234   ALKPHOS 151 (H) 09/07/2016 1234   BILITOT 0.51 09/07/2016 1234   GFRNONAA 22 (L) 09/02/2016 0435   GFRAA 25 (L) 09/02/2016 0435    No results found for: SPEP, UPEP  Lab Results  Component Value Date   WBC 12.7 (H) 09/07/2016   NEUTROABS 10.7 (H) 09/07/2016   HGB 9.6 (L) 09/07/2016   HCT 29.5 (L) 09/07/2016   MCV 88.9  09/07/2016   PLT 174 09/07/2016      Chemistry      Component Value Date/Time   NA 133 (L) 09/07/2016 1234   K 4.6 09/07/2016 1234   CL 106 09/02/2016 0435   CO2 22 09/07/2016 1234   BUN 12.2 09/07/2016 1234   CREATININE 1.9 (H) 09/07/2016 1234   GLU 152 07/13/2016      Component Value Date/Time   CALCIUM 9.6 09/07/2016 1234   ALKPHOS 151 (H) 09/07/2016 1234   AST 23 09/07/2016 1234   ALT 23 09/07/2016 1234   BILITOT 0.51 09/07/2016 1234       RADIOGRAPHIC STUDIES: I have personally reviewed the radiological images as listed and agreed with the findings in the report. Dg Thoracic Spine 2 View  Result Date: 09/07/2016 CLINICAL DATA:  Back pain for the past few days. History of multiple myeloma. EXAM: THORACIC SPINE 2 VIEWS COMPARISON:  Chest x-ray a 08/31/2016 and MRI 05/15/2016 FINDINGS: Normal alignment of the thoracic vertebral bodies. No acute fracture or abnormal paraspinal soft tissue swelling. No obvious lytic myelomatous lesions. Small lesions were noted on the prior MRI. Stable right-sided IJ catheter. Stable elevation of the left hemidiaphragm with overlying atelectasis. IMPRESSION: Normal alignment and no acute bony findings. No obvious lytic myelomatous lesions. Electronically Signed   By: Marijo Sanes M.D.   On: 09/07/2016 12:19   Dg Lumbar Spine 2-3 Views  Result Date: 09/07/2016 CLINICAL DATA:  Pain across the low back for few days. EXAM: LUMBAR SPINE - 2-3 VIEW COMPARISON:  MR lumbar spine 05/15/2016 FINDINGS: There are 6 nonrib bearing lumbar-type vertebral bodies. There is severe osteopenia. There is a chronic L3 vertebral body compression fracture which is unchanged compared with 05/15/2016. Lytic lesion in the anterior aspect of the L3 vertebral body. There is a chronic L5 and L6 vertebral body compression fracture. There is a age-indeterminate T12 and L1 vertebral body compression fractures. The alignment is anatomic. There is no spondylolysis. There is no static  listhesis. Degenerative disc disease with disc height loss at L4-5, L5-6 and L6-S1. Bilateral facet arthropathy at L5-6 and L6-S1. There is mild osteoarthritis of the sacroiliac joints. There is abdominal aortic atherosclerosis. Pigtail catheter in the right upper quadrant. IMPRESSION: 1. Age-indeterminate T12 and L1 vertebral body compression fractures. This is new compared with 05/15/2016. 2. Chronic L3 vertebral  body compression fracture which is unchanged compared with 05/15/2016. Lytic lesion in the anterior aspect of the L3 vertebral body consistent with patient's history of multiple myeloma. 3. Chronic L5 and L6 vertebral body compression fracture. Electronically Signed   By: Kathreen Devoid   On: 09/07/2016 12:25     ASSESSMENT & PLAN:  Multiple myeloma not having achieved remission ([place]) The patient is very concerned about multiple myeloma causing worsening back pain. I review his recent myeloma panel with the patient and family members. Even though the serum light chain when up around 08/21/2016, overall, his serum IgG, M spike and others were improving. He will continue treatment next week as scheduled  Acalculous cholecystitis He is clinically doing well with percutaneous drainage. 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 Recently, I started dexamethasone taper and I suspect that could be to cause of his flare of back pain. The patient is also reluctant to take pain medicine since yesterday because of one episode of nausea and vomiting I reviewed the x-ray with the patient and family members which show chronic compression fractures. Examination today is benign. We discussed the importance of taking pain medication regularly, heat pads and muscle relaxants  Anemia of renal disease This is likely anemia of chronic disease. The patient denies recent history of bleeding such as epistaxis, hematuria or  hematochezia. He is asymptomatic from the anemia. We will observe for now.  He does not require transfusion now. He is receiving ESA through dialysis center   Protein-calorie malnutrition, moderate ([place]) He is eating better and is gaining weight. I continue to encourage him to increase oral intake as tolerated He will continue dexamethasone 2 mg daily  ESRD (end stage renal disease) on dialysis [facility]) He is receiving hemodialysis in the outpatient on Tuesdays, Thursdays and Saturdays.  N&V (nausea and vomiting) He had recent nausea and vomiting of unknown etiology. I reinforced the importance of taking anti-emetics to control his symptoms.   No orders of the defined types were placed in this encounter.  All questions were answered. The patient knows to call the clinic with any problems, questions or concerns. No barriers to learning was detected. I spent 25 minutes counseling the patient face to face. The total time spent in the appointment was 40 minutes and more than 50% was on counseling and review of test results     Heath Lark, MD 09/07/2016 3:47 PM

## 2016-09-07 NOTE — Assessment & Plan Note (Signed)
He is clinically doing well with percutaneous drainage. ERCP has been scheduled in 2 weeks

## 2016-09-07 NOTE — Assessment & Plan Note (Signed)
This is likely anemia of chronic disease. The patient denies recent history of bleeding such as epistaxis, hematuria or hematochezia. He is asymptomatic from the anemia. We will observe for now.  He does not require transfusion now. He is receiving ESA through dialysis center

## 2016-09-07 NOTE — Telephone Encounter (Signed)
Informed wife of appts added for today.  Take pt to Xray first at [facility] around noon then come to [facility] for lab and MD visit.  She verbalized understanding.  States he took the Robaxin.  Encourged her to have him take zofran and oxycodone too so he can be comfortable enough to ride in car and get xray done.  She verbalized understanding.

## 2016-09-07 NOTE — Assessment & Plan Note (Signed)
He is eating better and is gaining weight. I continue to encourage him to increase oral intake as tolerated He will continue dexamethasone 2 mg daily

## 2016-09-07 NOTE — Assessment & Plan Note (Signed)
He had recent nausea and vomiting of unknown etiology. I reinforced the importance of taking anti-emetics to control his symptoms.

## 2016-09-07 NOTE — Telephone Encounter (Signed)
"  I need to leave a message for Dr. Calton Dach nurse."  Call transferred ext [phone].

## 2016-09-07 NOTE — Assessment & Plan Note (Signed)
He is receiving hemodialysis in the outpatient on Tuesdays, Thursdays and Saturdays.

## 2016-09-10 NOTE — Progress Notes (Deleted)
    Postoperative Access Visit   History of Present Illness  Thomas [name] is a 70 y.o. year old male who presents for postoperative follow-up for: L 1st BRVT (Date: 31 JUn 67).  The patient's wounds are *** healed.  The patient notes *** steal symptoms.  The patient is *** able to complete their activities of daily living.  The patient's current symptoms are: ***.  For VQI Use Only  PRE-ADM LIVING: Home  AMB STATUS: Ambulatory  Physical Examination There were no vitals filed for this visit.  LUE: Incision is *** healed, skin feels ***, hand grip is ***/5, sensation in digits is *** intact, ***palpable thrill, bruit can *** be auscultated   Medical Decision Making  Thomas [name] is a 70 y.o. year old male who presents s/p L 1st stage BRVT.   The patient's access is *** ready for use.  The patient's tunneled dialysis catheter can be removed after two successful cannulations and completed dialysis treatments.  Thank you for allowing [place] to participate in this patient's care.  Adele Barthel, MD, FACS [facility] Office: [phone] Pager: [phone]

## 2016-09-11 ENCOUNTER — Telehealth: Payer: Self-pay | Admitting: Hematology and Oncology

## 2016-09-11 ENCOUNTER — Other Ambulatory Visit: Payer: PPO

## 2016-09-11 ENCOUNTER — Ambulatory Visit (HOSPITAL_BASED_OUTPATIENT_CLINIC_OR_DEPARTMENT_OTHER): Payer: PPO

## 2016-09-11 ENCOUNTER — Encounter: Payer: Self-pay | Admitting: Hematology and Oncology

## 2016-09-11 ENCOUNTER — Ambulatory Visit (HOSPITAL_BASED_OUTPATIENT_CLINIC_OR_DEPARTMENT_OTHER): Payer: PPO | Admitting: Hematology and Oncology

## 2016-09-11 VITALS — BP 152/63 | HR 95 | Temp 98.1°F | Resp 19

## 2016-09-11 VITALS — BP 143/80 | HR 91 | Temp 97.9°F | Resp 20

## 2016-09-11 DIAGNOSIS — Z5112 Encounter for antineoplastic immunotherapy: Secondary | ICD-10-CM | POA: Diagnosis not present

## 2016-09-11 DIAGNOSIS — G893 Neoplasm related pain (acute) (chronic): Secondary | ICD-10-CM

## 2016-09-11 DIAGNOSIS — E440 Moderate protein-calorie malnutrition: Secondary | ICD-10-CM | POA: Diagnosis not present

## 2016-09-11 DIAGNOSIS — Z23 Encounter for immunization: Secondary | ICD-10-CM | POA: Diagnosis not present

## 2016-09-11 DIAGNOSIS — C9000 Multiple myeloma not having achieved remission: Secondary | ICD-10-CM

## 2016-09-11 MED ORDER — BORTEZOMIB CHEMO SQ INJECTION 3.5 MG (2.5MG/ML)
1.3000 mg/m2 | Freq: Once | INTRAMUSCULAR | Status: AC
  Administered 2016-09-11: 2.5 mg via SUBCUTANEOUS
  Filled 2016-09-11: qty 2.5

## 2016-09-11 MED ORDER — INFLUENZA VAC SPLIT QUAD 0.5 ML IM SUSY
0.5000 mL | PREFILLED_SYRINGE | Freq: Once | INTRAMUSCULAR | Status: AC
  Administered 2016-09-11: 0.5 mL via INTRAMUSCULAR
  Filled 2016-09-11: qty 0.5

## 2016-09-11 MED ORDER — PROCHLORPERAZINE MALEATE 10 MG PO TABS
ORAL_TABLET | ORAL | Status: AC
  Filled 2016-09-11: qty 1

## 2016-09-11 MED ORDER — PROCHLORPERAZINE MALEATE 10 MG PO TABS
10.0000 mg | ORAL_TABLET | Freq: Once | ORAL | Status: AC
  Administered 2016-09-11: 10 mg via ORAL

## 2016-09-11 MED FILL — NORMAL SALINE FLUSH SYRINGE: 0.9 | 30 days supply | Qty: 300 | Fill #0

## 2016-09-11 NOTE — Assessment & Plan Note (Signed)
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 Recently, I started dexamethasone taper and I suspect that could be to cause of his flare of back pain. Examination today is benign. We discussed the importance of taking pain medication regularly, heat pads and muscle relaxants

## 2016-09-11 NOTE — Progress Notes (Signed)
[place] OFFICE PROGRESS NOTE  Patient Care Team: No Pcp Per Patient as PCP - General (General Practice) Thana Ates, RN as [place], LCSW as Social Worker  SUMMARY OF ONCOLOGIC HISTORY:   Multiple myeloma not having achieved remission ([place])   05/09/2016 - 06/07/2016 Hospital Admission    The patient was hospitalized for pancytopenia, acute renal failure and back pain. He was diagnosed with multiple myeloma requiring multiple surgery and hemodialysis. He was started on chemotherapy while hospitalized      05/11/2016 Pathology Results    Accession: [id number] kidney biopsy confimed myeloma involvement      05/12/2016 Imaging    Skeletal survey showed multiple bone lesions are suggestive for metastatic bone disease and suspicious for multiple myeloma. There is imminent pathological fracture of the left femur      05/14/2016 Pathology Results    Accession: [id number] biopsy from left femur confirmed myeloma      05/16/2016 Pathology Results    Bone marrow complex cytogenetics. Please see report      05/16/2016 Imaging    MRI spine: large 6.0 x 9.0 x 7.5 cm lesion with associated extraosseous extension of tumor into the left paraspinous soft tissues at L5 with moderate to severe canal stenosis. There is L3 compression fracture      05/16/2016 Bone Marrow Biopsy    Accession: [id number] bone marrow biopsy confirmed 60% plasma cell involvement      05/18/2016 -  Chemotherapy    He was started on Velcade and dexamethasone      07/03/2016 -  Chemotherapy    Low dose Revlimid is added       INTERVAL HISTORY: Please see below for problem oriented charting. He feels better today. His back pain remain well controlled. He denies nausea, vomiting or change in bowel habits. Denies recent fever or chills.  REVIEW OF SYSTEMS:   Constitutional: Denies fevers, chills or abnormal weight loss Eyes: Denies blurriness of  vision Ears, nose, mouth, throat, and face: Denies mucositis or sore throat Respiratory: Denies cough, dyspnea or wheezes Cardiovascular: Denies palpitation, chest discomfort or lower extremity swelling Gastrointestinal:  Denies nausea, heartburn or change in bowel habits Skin: Denies abnormal skin rashes Lymphatics: Denies new lymphadenopathy or easy bruising Neurological:Denies numbness, tingling or new weaknesses Behavioral/Psych: Mood is stable, no new changes  All other systems were reviewed with the patient and are negative.  I have reviewed the past medical history, past surgical history, social history and family history with the patient and they are unchanged from previous note.  ALLERGIES:  is allergic to penicillins; dilaudid [hydromorphone hcl]; fish allergy; and codeine.  MEDICATIONS:  Current Outpatient Prescriptions  Medication Sig Dispense Refill  . acetaminophen (TYLENOL) 325 MG tablet Take 2 tablets (650 mg total) by mouth every 6 (six) hours as needed for mild pain (or Fever >/= 101). 15 tablet 0  . acyclovir (ZOVIRAX) 200 MG capsule Take 1 capsule (200 mg total) by mouth 2 (two) times daily. 60 capsule 9  . ALPRAZolam (XANAX) 0.25 MG tablet Take 1 tablet (0.25 mg total) by mouth 2 (two) times daily as needed for anxiety. 10 tablet 0  . bisacodyl (DULCOLAX) 10 MG suppository Place 1 suppository (10 mg total) rectally daily as needed for moderate constipation. 12 suppository 0  . calcium acetate (PHOSLO) 667 MG capsule Take 1 capsule (667 mg total) by mouth 2 (two) times daily with a meal. 60 capsule 1  . Cholecalciferol (  VITAMIN D3) 50000 units CAPS Take 1 capsule by mouth once a week.    . cyanocobalamin 1000 MCG tablet Take 1 tablet (1,000 mcg total) by mouth daily. 30 tablet 0  . dexamethasone (DECADRON) 2 MG tablet Take 2 mg by mouth daily.    . insulin aspart (NOVOLOG) 100 UNIT/ML injection Before each meal 3 times a day, 140-199 - 2 units, 200-250 - 4 units, 251-299  - 6 units,  300-349 - 8 units,  350 or above 10 units. Dispense syringes and needles as needed, Ok to switch to PEN if approved. Substitute to any brand approved. DX DM2, Code E11.65 1 vial 12  . loratadine (CLARITIN) 10 MG tablet Take 10 mg by mouth daily.     . methocarbamol (ROBAXIN) 500 MG tablet Take 500 mg by mouth 4 (four) times daily. Takes a half tablet as needed    . midodrine (PROAMATINE) 10 MG tablet Take 1 tablet (10 mg total) by mouth 3 (three) times daily with meals.    . multivitamin (RENA-VIT) TABS tablet Take 1 tablet by mouth at bedtime. 30 tablet 0  . ondansetron (ZOFRAN) 8 MG tablet Take 1 tablet (8 mg total) by mouth every 8 (eight) hours as needed for nausea. 30 tablet 3  . oxyCODONE (OXY IR/ROXICODONE) 5 MG immediate release tablet Take 1 tablet (5 mg total) by mouth every 4 (four) hours as needed for moderate pain. 10 tablet 0  . pantoprazole (PROTONIX) 40 MG tablet Take 1 tablet (40 mg total) by mouth daily. 30 tablet 1  . polyethylene glycol (MIRALAX / GLYCOLAX) packet Take 17 g by mouth daily as needed for mild constipation or moderate constipation. 14 each 0  . protein supplement (RESOURCE BENEPROTEIN) POWD Take 6 g by mouth 3 (three) times daily with meals.  0  . saccharomyces boulardii (FLORASTOR) 250 MG capsule Take 1 capsule (250 mg total) by mouth 2 (two) times daily. 10 capsule 0  . triamcinolone (NASACORT AQ) 55 MCG/ACT AERO nasal inhaler Place 2 sprays into the nose daily. 1 Inhaler 1   Current Facility-Administered Medications  Medication Dose Route Frequency Provider Last Rate Last Dose  . Influenza vac split quadrivalent PF (FLUARIX) injection 0.5 mL  0.5 mL Intramuscular Once Heath Lark, MD        PHYSICAL EXAMINATION: ECOG PERFORMANCE STATUS: 1 - Symptomatic but completely ambulatory  Vitals:   09/11/16 0942  BP: (!) 152/63  Pulse: 95  Resp: 19  Temp: 98.1 F (36.7 C)   Filed Weights    GENERAL:alert, no distress and comfortable. He looks thin  and cachectic SKIN: skin color, texture, turgor are normal, no rashes or significant lesions EYES: normal, Conjunctiva are pink and non-injected, sclera clear OROPHARYNX:no exudate, no erythema and lips, buccal mucosa, and tongue normal  NECK: supple, thyroid normal size, non-tender, without nodularity LYMPH:  no palpable lymphadenopathy in the cervical, axillary or inguinal LUNGS: clear to auscultation and percussion with normal breathing effort HEART: regular rate & rhythm and no murmurs and no lower extremity edema ABDOMEN:abdomen soft, non-tender and normal bowel sounds. Percutaneous drain is draining minimum bilious liquid Musculoskeletal:no cyanosis of digits and no clubbing  NEURO: alert & oriented x 3 with fluent speech, no focal motor/sensory deficits  LABORATORY DATA:  I have reviewed the data as listed    Component Value Date/Time   NA 133 (L) 09/07/2016 1234   K 4.6 09/07/2016 1234   CL 106 09/02/2016 0435   CO2 22 09/07/2016 1234  GLUCOSE 161 (H) 09/07/2016 1234   BUN 12.2 09/07/2016 1234   CREATININE 1.9 (H) 09/07/2016 1234   CALCIUM 9.6 09/07/2016 1234   PROT 9.1 (H) 09/07/2016 1234   ALBUMIN 2.7 (L) 09/07/2016 1234   AST 23 09/07/2016 1234   ALT 23 09/07/2016 1234   ALKPHOS 151 (H) 09/07/2016 1234   BILITOT 0.51 09/07/2016 1234   GFRNONAA 22 (L) 09/02/2016 0435   GFRAA 25 (L) 09/02/2016 0435    No results found for: SPEP, UPEP  Lab Results  Component Value Date   WBC 12.7 (H) 09/07/2016   NEUTROABS 10.7 (H) 09/07/2016   HGB 9.6 (L) 09/07/2016   HCT 29.5 (L) 09/07/2016   MCV 88.9 09/07/2016   PLT 174 09/07/2016      Chemistry      Component Value Date/Time   NA 133 (L) 09/07/2016 1234   K 4.6 09/07/2016 1234   CL 106 09/02/2016 0435   CO2 22 09/07/2016 1234   BUN 12.2 09/07/2016 1234   CREATININE 1.9 (H) 09/07/2016 1234   GLU 152 07/13/2016      Component Value Date/Time   CALCIUM 9.6 09/07/2016 1234   ALKPHOS 151 (H) 09/07/2016 1234   AST  23 09/07/2016 1234   ALT 23 09/07/2016 1234   BILITOT 0.51 09/07/2016 1234       ASSESSMENT & PLAN:  Multiple myeloma not having achieved remission ([place]) I review his recent myeloma panel with the patient and family members. Even though the serum light chain when up around 08/21/2016, overall, his serum IgG, M spike and others were improving. He will continue treatment next week as scheduled I plan to repeat myeloma panel again next week and reassess when I see him back in 3 weeks. If he is pancytopenia improves and he were able to have removal of the percutaneous drain, I plan to add Revlimid back to his regimen He is currently on acyclovir for antimicrobial prophylaxis, along with calcium and vitamin D. He is not receiving Zometa due to renal failure I plan to reduce dexamethasone to 1 mg daily.  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 Recently, I started dexamethasone taper and I suspect that could be to cause of his flare of back pain. Examination today is benign. We discussed the importance of taking pain medication regularly, heat pads and muscle relaxants  Protein-calorie malnutrition, moderate ([place]) He is eating better and is gaining weight. I continue to encourage him to increase oral intake as tolerated Due to poorly controlled hyperglycemia postprandially, I plan to reduce dexamethasone to 1 mg daily   Orders Placed This Encounter  Procedures  . Kappa/lambda light chains    Standing Status:   Future    Standing Expiration Date:   10/16/2017  . Multiple Myeloma Panel (SPEP&IFE w/QIG)    Standing Status:   Future    Standing Expiration Date:   10/16/2017   All questions were answered. The patient knows to call the clinic with any problems, questions or concerns. No barriers to learning was detected. I spent 15 minutes counseling the patient face to face. The total time spent in the appointment was 20 minutes and more than 50%  was on counseling and review of test results     Heath Lark, MD 09/11/2016 10:19 AM

## 2016-09-11 NOTE — Assessment & Plan Note (Addendum)
I review his recent myeloma panel with the patient and family members. Even though the serum light chain when up around 08/21/2016, overall, his serum IgG, M spike and others were improving. He will continue treatment next week as scheduled I plan to repeat myeloma panel again next week and reassess when I see him back in 3 weeks. If he is pancytopenia improves and he were able to have removal of the percutaneous drain, I plan to add Revlimid back to his regimen He is currently on acyclovir for antimicrobial prophylaxis, along with calcium and vitamin D. He is not receiving Zometa due to renal failure I plan to reduce dexamethasone to 1 mg daily.

## 2016-09-11 NOTE — Assessment & Plan Note (Signed)
He is eating better and is gaining weight. I continue to encourage him to increase oral intake as tolerated Due to poorly controlled hyperglycemia postprandially, I plan to reduce dexamethasone to 1 mg daily

## 2016-09-11 NOTE — Telephone Encounter (Signed)
Gave patient/relative avs report and appointments for October and November  °

## 2016-09-12 ENCOUNTER — Ambulatory Visit
Admission: RE | Admit: 2016-09-12 | Discharge: 2016-09-12 | Disposition: A | Discharge status: Home / Self Care | Payer: PPO | Source: Ambulatory Visit | Attending: Radiation Oncology | Admitting: Radiation Oncology

## 2016-09-12 ENCOUNTER — Other Ambulatory Visit: Payer: Self-pay

## 2016-09-12 ENCOUNTER — Telehealth: Payer: Self-pay | Admitting: *Deleted

## 2016-09-12 ENCOUNTER — Encounter: Payer: Self-pay | Admitting: Vascular Surgery

## 2016-09-12 VITALS — BP 128/65 | HR 104 | Temp 99.4°F | Ht 74.0 in

## 2016-09-12 DIAGNOSIS — J9819 Other pulmonary collapse: Secondary | ICD-10-CM | POA: Diagnosis not present

## 2016-09-12 DIAGNOSIS — C9000 Multiple myeloma not having achieved remission: Secondary | ICD-10-CM

## 2016-09-12 DIAGNOSIS — Z794 Long term (current) use of insulin: Secondary | ICD-10-CM | POA: Diagnosis not present

## 2016-09-12 DIAGNOSIS — M4856XA Collapsed vertebra, not elsewhere classified, lumbar region, initial encounter for fracture: Secondary | ICD-10-CM | POA: Diagnosis not present

## 2016-09-12 DIAGNOSIS — M5136 Other intervertebral disc degeneration, lumbar region: Secondary | ICD-10-CM | POA: Insufficient documentation

## 2016-09-12 DIAGNOSIS — I700 Atherosclerosis of aorta: Secondary | ICD-10-CM | POA: Insufficient documentation

## 2016-09-12 DIAGNOSIS — K830 Cholangitis: Secondary | ICD-10-CM | POA: Diagnosis not present

## 2016-09-12 DIAGNOSIS — M8580 Other specified disorders of bone density and structure, unspecified site: Secondary | ICD-10-CM | POA: Diagnosis not present

## 2016-09-12 DIAGNOSIS — J90 Pleural effusion, not elsewhere classified: Secondary | ICD-10-CM | POA: Diagnosis not present

## 2016-09-12 DIAGNOSIS — Z7952 Long term (current) use of systemic steroids: Secondary | ICD-10-CM | POA: Insufficient documentation

## 2016-09-12 DIAGNOSIS — K8010 Calculus of gallbladder with chronic cholecystitis without obstruction: Secondary | ICD-10-CM

## 2016-09-12 LAB — CBC WITH DIFFERENTIAL/PLATELET
BASO%: 0.3 % (ref 0.0–2.0)
BASOS ABS: 0.1 10*3/uL (ref 0.0–0.1)
EOS%: 0.2 % (ref 0.0–7.0)
Eosinophils Absolute: 0 10*3/uL (ref 0.0–0.5)
HCT: 28.8 % — ABNORMAL LOW (ref 38.4–49.9)
HGB: 9.4 g/dL — ABNORMAL LOW (ref 13.0–17.1)
LYMPH%: 8.7 % — AB (ref 14.0–49.0)
MCH: 29.2 pg (ref 27.2–33.4)
MCHC: 32.6 g/dL (ref 32.0–36.0)
MCV: 89.4 fL (ref 79.3–98.0)
MONO#: 1.4 10*3/uL — ABNORMAL HIGH (ref 0.1–0.9)
MONO%: 8.9 % (ref 0.0–14.0)
NEUT#: 13.2 10*3/uL — ABNORMAL HIGH (ref 1.5–6.5)
NEUT%: 81.9 % — AB (ref 39.0–75.0)
Platelets: 155 10*3/uL (ref 140–400)
RBC: 3.22 10*6/uL — AB (ref 4.20–5.82)
RDW: 15 % — ABNORMAL HIGH (ref 11.0–14.6)
WBC: 16.2 10*3/uL — ABNORMAL HIGH (ref 4.0–10.3)
lymph#: 1.4 10*3/uL (ref 0.9–3.3)

## 2016-09-12 LAB — COMPREHENSIVE METABOLIC PANEL
ALBUMIN: 2.7 g/dL — AB (ref 3.5–5.0)
ALK PHOS: 162 U/L — AB (ref 40–150)
ALT: 29 U/L (ref 0–55)
AST: 26 U/L (ref 5–34)
Anion Gap: 10 mEq/L (ref 3–11)
BILIRUBIN TOTAL: 0.42 mg/dL (ref 0.20–1.20)
BUN: 13.2 mg/dL (ref 7.0–26.0)
CO2: 27 meq/L (ref 22–29)
CREATININE: 2.3 mg/dL — AB (ref 0.7–1.3)
Calcium: 9.8 mg/dL (ref 8.4–10.4)
Chloride: 95 mEq/L — ABNORMAL LOW (ref 98–109)
EGFR: 28 mL/min/{1.73_m2} — AB (ref >90)
GLUCOSE: 167 mg/dL — AB (ref 70–140)
Potassium: 5 mEq/L (ref 3.5–5.1)
Sodium: 132 mEq/L — ABNORMAL LOW (ref 136–145)
Total Protein: 10.1 g/dL — ABNORMAL HIGH (ref 6.4–8.3)

## 2016-09-12 LAB — TECHNOLOGIST REVIEW

## 2016-09-12 NOTE — Progress Notes (Addendum)
Thomas [name] here for reassessment S/P XRT. He reports no pain in the left femur, but states it "feels weaker" at times. Continues to have intermittent pain lower back.  Travel by W/C.  Refused to weigh due to fatigue.  He just completed Dialysis. Note 99.7 temp today.  Had Flu Vaccine yesterday.     BP 128/65   Pulse (!) 104   Temp 99.4 F (37.4 C) (Oral)   Ht 6\' 2"  (1.88 m)   SpO2 100%     Wt. 140.8 lbs when weighed in Dialysis today Wt Readings from Last 3 Encounters:  09/07/16 145 lb 3.2 oz (65.9 kg)  09/05/16 145 lb (65.8 kg)  09/04/16 143 lb 1.3 oz (64.9 kg)

## 2016-09-12 NOTE — Telephone Encounter (Signed)
Called Ms. Roscoe to relay that Dr. Redmond Pulling wants him to have a study to evaluate the drain tube, and relayed that  his liver function and bilirubin are normal.  Encouraged her to call and request an appointment as, soon as possible, and she stated that she would call him tomorrow.

## 2016-09-12 NOTE — Patient Outreach (Signed)
Transition of care call: Placed call to patients home. No answer. Left a message. Placed call to wife's cell phone and wife answers and reports that patient is at MD office. Wife reports bilary drain stopped working and patient is having labs drawn.  Wife reports no other problems or concerns.  PLAN: Encouraged wife to call back if needed. Will continue weekly outreach attempts for follow up.  Tomasa Rand, RN, BSN, CEN [facility] [employer] [phone]

## 2016-09-13 ENCOUNTER — Other Ambulatory Visit (HOSPITAL_COMMUNITY): Payer: Self-pay | Admitting: Radiology

## 2016-09-13 ENCOUNTER — Other Ambulatory Visit: Payer: Self-pay | Admitting: General Surgery

## 2016-09-13 ENCOUNTER — Other Ambulatory Visit (HOSPITAL_COMMUNITY): Payer: Self-pay | Admitting: General Surgery

## 2016-09-13 DIAGNOSIS — K819 Cholecystitis, unspecified: Secondary | ICD-10-CM

## 2016-09-14 ENCOUNTER — Ambulatory Visit (HOSPITAL_BASED_OUTPATIENT_CLINIC_OR_DEPARTMENT_OTHER): Payer: PPO

## 2016-09-14 ENCOUNTER — Other Ambulatory Visit: Payer: Self-pay | Admitting: *Deleted

## 2016-09-14 ENCOUNTER — Telehealth: Payer: Self-pay | Admitting: *Deleted

## 2016-09-14 VITALS — BP 116/56 | HR 102 | Temp 97.6°F | Resp 18

## 2016-09-14 DIAGNOSIS — C9000 Multiple myeloma not having achieved remission: Secondary | ICD-10-CM | POA: Diagnosis not present

## 2016-09-14 DIAGNOSIS — Z5112 Encounter for antineoplastic immunotherapy: Secondary | ICD-10-CM | POA: Diagnosis not present

## 2016-09-14 MED ORDER — BORTEZOMIB CHEMO SQ INJECTION 3.5 MG (2.5MG/ML)
1.3000 mg/m2 | Freq: Once | INTRAMUSCULAR | Status: AC
  Administered 2016-09-14: 2.5 mg via SUBCUTANEOUS
  Filled 2016-09-14: qty 2.5

## 2016-09-14 MED ORDER — PROCHLORPERAZINE MALEATE 10 MG PO TABS
10.0000 mg | ORAL_TABLET | Freq: Once | ORAL | Status: AC
  Administered 2016-09-14: 10 mg via ORAL

## 2016-09-14 MED ORDER — PROCHLORPERAZINE MALEATE 10 MG PO TABS
ORAL_TABLET | ORAL | Status: AC
  Filled 2016-09-14: qty 1

## 2016-09-14 NOTE — Patient Instructions (Signed)
[facility] Discharge Instructions for Patients Receiving Chemotherapy  Today you received the following chemotherapy agents Velcade. To help prevent nausea and vomiting after your treatment, we encourage you to take your nausea medication as directed.  If you develop nausea and vomiting that is not controlled by your nausea medication, call the clinic.   BELOW ARE SYMPTOMS THAT SHOULD BE REPORTED IMMEDIATELY:  *FEVER GREATER THAN 100.5 F  *CHILLS WITH OR WITHOUT FEVER  NAUSEA AND VOMITING THAT IS NOT CONTROLLED WITH YOUR NAUSEA MEDICATION  *UNUSUAL SHORTNESS OF BREATH  *UNUSUAL BRUISING OR BLEEDING  TENDERNESS IN MOUTH AND THROAT WITH OR WITHOUT PRESENCE OF ULCERS  *URINARY PROBLEMS  *BOWEL PROBLEMS  UNUSUAL RASH Items with * indicate a potential emergency and should be followed up as soon as possible.  Feel free to call the clinic you have any questions or concerns. The clinic phone number is [phone].  Please show the CHEMO ALERT CARD at check-in to the Emergency Department and triage nurse.    

## 2016-09-14 NOTE — Progress Notes (Signed)
Radiation Oncology         [phone] ________________________________  Name: Thomas [name] MRN: [id number]  Date: 09/12/2016  DOB: 15-May-1946  Post Treatment Note  CC: No PCP Per Patient  Consuella Lose, MD  Diagnosis:   70 year old gentleman with multiple myeloma with symptomatic disease in the lumbar spine and left femur.  Interval Since Last Radiation:  8 weeks   06/28/16-07/11/16: 1.  The lumbar spine centered on L4 to 20 Gy in 10 fractions 2.  The left femur including the implanted rod to 20 Gy in 10 fractions   Narrative:  The patient returns today for routine follow-up.  He tolerated radiotherapy well and did not have any significant complaints during treatment. He is planning to consider a new systemic therapy with Dr. Alvy Bimler. Of note a few weeks ago he was diagnosed with choledocholithiasis and underwent cholecystotomy tube placement and antibiotic therapy. He continues to follow up with Dr. Redmond Pulling for this, and has noted significantly less output from his tube in the last few days.                             On review of systems, the patient states that his pain is improved since his radiotherapy. He denies any chest pain, shortness of breath, fevers or chills.   ALLERGIES:  is allergic to penicillins; dilaudid [hydromorphone hcl]; fish allergy; and codeine.  Meds: Current Outpatient Prescriptions  Medication Sig Dispense Refill  . acetaminophen (TYLENOL) 325 MG tablet Take 2 tablets (650 mg total) by mouth every 6 (six) hours as needed for mild pain (or Fever >/= 101). 15 tablet 0  . acyclovir (ZOVIRAX) 200 MG capsule Take 1 capsule (200 mg total) by mouth 2 (two) times daily. 60 capsule 9  . ALPRAZolam (XANAX) 0.25 MG tablet Take 1 tablet (0.25 mg total) by mouth 2 (two) times daily as needed for anxiety. 10 tablet 0  . bisacodyl (DULCOLAX) 10 MG suppository Place 1 suppository (10 mg total) rectally daily as needed for moderate constipation. 12 suppository 0  .  calcium acetate (PHOSLO) 667 MG capsule Take 1 capsule (667 mg total) by mouth 2 (two) times daily with a meal. 60 capsule 1  . Cholecalciferol (VITAMIN D3) 50000 units CAPS Take 1 capsule by mouth once a week.    . cyanocobalamin 1000 MCG tablet Take 1 tablet (1,000 mcg total) by mouth daily. 30 tablet 0  . dexamethasone (DECADRON) 2 MG tablet Take 2 mg by mouth daily.    . insulin aspart (NOVOLOG) 100 UNIT/ML injection Before each meal 3 times a day, 140-199 - 2 units, 200-250 - 4 units, 251-299 - 6 units,  300-349 - 8 units,  350 or above 10 units. Dispense syringes and needles as needed, Ok to switch to PEN if approved. Substitute to any brand approved. DX DM2, Code E11.65 1 vial 12  . loratadine (CLARITIN) 10 MG tablet Take 10 mg by mouth daily.     . methocarbamol (ROBAXIN) 500 MG tablet Take 500 mg by mouth 4 (four) times daily. Takes a half tablet as needed    . midodrine (PROAMATINE) 10 MG tablet Take 1 tablet (10 mg total) by mouth 3 (three) times daily with meals.    . multivitamin (RENA-VIT) TABS tablet Take 1 tablet by mouth at bedtime. 30 tablet 0  . ondansetron (ZOFRAN) 8 MG tablet Take 1 tablet (8 mg total) by mouth every 8 (eight) hours  as needed for nausea. 30 tablet 3  . oxyCODONE (OXY IR/ROXICODONE) 5 MG immediate release tablet Take 1 tablet (5 mg total) by mouth every 4 (four) hours as needed for moderate pain. 10 tablet 0  . pantoprazole (PROTONIX) 40 MG tablet Take 1 tablet (40 mg total) by mouth daily. 30 tablet 1  . polyethylene glycol (MIRALAX / GLYCOLAX) packet Take 17 g by mouth daily as needed for mild constipation or moderate constipation. 14 each 0  . protein supplement (RESOURCE BENEPROTEIN) POWD Take 6 g by mouth 3 (three) times daily with meals.  0  . saccharomyces boulardii (FLORASTOR) 250 MG capsule Take 1 capsule (250 mg total) by mouth 2 (two) times daily. 10 capsule 0  . triamcinolone (NASACORT AQ) 55 MCG/ACT AERO nasal inhaler Place 2 sprays into the nose  daily. 1 Inhaler 1   No current facility-administered medications for this encounter.     Physical Findings:  height is _0  (1.88 m). His oral temperature is 99.4 F (37.4 C). His blood pressure is 128/65 and his pulse is 104 (abnormal). His oxygen saturation is 100%.  In general this is a well appearing Caucasian male  in no acute distress. He's alert and oriented x4 and appropriate throughout the examination. Cardiopulmonary assessment is negative for acute distress and he exhibits normal effort. His abdomen is assessed and has active bowel sounds x4. The RUQ drain is intact without cellulitic change at the skin. No rebound tenderness is noted, and only minimal biliary drainage is noted in the drain tubing.  Lab Findings: Lab Results  Component Value Date   WBC 16.2 (H) 09/12/2016   HGB 9.4 (L) 09/12/2016   HCT 28.8 (L) 09/12/2016   MCV 89.4 09/12/2016   PLT 155 09/12/2016     Radiographic Findings: Dg Chest 2 View  Result Date: 08/31/2016 CLINICAL DATA:  Fever today. Cough for 1 month. Chemotherapy patient. Nonsmoker. EXAM: CHEST  2 VIEW COMPARISON:  07/13/2016 FINDINGS: Right central venous catheter with tip over the cavoatrial junction region. No pneumothorax. Slightly shallow inspiration. Normal heart size and pulmonary vascularity. Emphysematous changes in the lungs. Increased density in the left lung base posteriorly likely representing left lower lobe collapse. This could be caused by pneumonia or endobronchial lesion. Pleural effusions have improved since previous study but remain present bilaterally. Calcification of the aorta. Degenerative changes in the spine. IMPRESSION: Left lower lobe collapse. Can't exclude pneumonia or endobronchial lesion. Emphysematous changes in the lungs. Electronically Signed   By: Lucienne Capers M.D.   On: 08/31/2016 23:14   Dg Thoracic Spine 2 View  Result Date: 09/07/2016 CLINICAL DATA:  Back pain for the past few days. History of multiple  myeloma. EXAM: THORACIC SPINE 2 VIEWS COMPARISON:  Chest x-ray a 08/31/2016 and MRI 05/15/2016 FINDINGS: Normal alignment of the thoracic vertebral bodies. No acute fracture or abnormal paraspinal soft tissue swelling. No obvious lytic myelomatous lesions. Small lesions were noted on the prior MRI. Stable right-sided IJ catheter. Stable elevation of the left hemidiaphragm with overlying atelectasis. IMPRESSION: Normal alignment and no acute bony findings. No obvious lytic myelomatous lesions. Electronically Signed   By: Marijo Sanes M.D.   On: 09/07/2016 12:19   Dg Lumbar Spine 2-3 Views  Result Date: 09/07/2016 CLINICAL DATA:  Pain across the low back for few days. EXAM: LUMBAR SPINE - 2-3 VIEW COMPARISON:  MR lumbar spine 05/15/2016 FINDINGS: There are 6 nonrib bearing lumbar-type vertebral bodies. There is severe osteopenia. There is a chronic L3 vertebral  body compression fracture which is unchanged compared with 05/15/2016. Lytic lesion in the anterior aspect of the L3 vertebral body. There is a chronic L5 and L6 vertebral body compression fracture. There is a age-indeterminate T12 and L1 vertebral body compression fractures. The alignment is anatomic. There is no spondylolysis. There is no static listhesis. Degenerative disc disease with disc height loss at L4-5, L5-6 and L6-S1. Bilateral facet arthropathy at L5-6 and L6-S1. There is mild osteoarthritis of the sacroiliac joints. There is abdominal aortic atherosclerosis. Pigtail catheter in the right upper quadrant. IMPRESSION: 1. Age-indeterminate T12 and L1 vertebral body compression fractures. This is new compared with 05/15/2016. 2. Chronic L3 vertebral body compression fracture which is unchanged compared with 05/15/2016. Lytic lesion in the anterior aspect of the L3 vertebral body consistent with patient's history of multiple myeloma. 3. Chronic L5 and L6 vertebral body compression fracture. Electronically Signed   By: Kathreen Devoid   On: 09/07/2016  12:25   Ir Sinus/fist Tube Chk-non Gi  Result Date: 08/30/2016 CLINICAL DATA:  Recent a calculus cholecystitis, recent sepsis, multiple myeloma EXAM: SINUS TRACT INJECTION/FISTULOGRAM ANESTHESIA/SEDATION: None. MEDICATIONS: None. CONTRAST:  27m ISOVUE-300 IOPAMIDOL (ISOVUE-300) INJECTION 61% PROCEDURE: The existing cholecystostomy catheter was injected with contrast. Fluoroscopic imaging performed. COMPLICATIONS: 029/47/6546FINDINGS: Contrast injection performed of the cholecystostomy. Catheter is within the collapsed gallbladder. Mixing artifact within the gallbladder compatible with gallbladder sludge. Cystic duct is patent. There is opacification of the intrahepatic ducts, biliary confluence, common hepatic duct. Common bile duct has an abrupt obstruction distally. Distal impacted choledocholithiasis not excluded. Further distention of the biliary system continues to demonstrate obstruction. At this point, the patient was uncomfortable and the injection was stopped. IMPRESSION: Abrupt distal CBD obstruction, suspicious for impacted choledocholithiasis versus underlying obstructing lesion or mass. These results were called by telephone at the time of interpretation on 08/30/2016 at 12:30pm to Dr. EGreer Pickerel, who verbally acknowledged these results. Electronically Signed   By: MJerilynn Mages  Shick M.D.   On: 08/30/2016 14:27   [place]dom[place] Limited Ruq  Result Date: 09/01/2016 CLINICAL DATA:  Acute onset of right upper quadrant abdominal pain and fever. Initial encounter. EXAM: [place] LIMITED - RIGHT UPPER QUADRANT COMPARISON:  Right upper quadrant ultrasound performed 07/13/2016 FINDINGS: Gallbladder: No gallstones or wall thickening visualized. The gallbladder is relatively decompressed and difficult to characterize. The patient's cholecystostomy tube is partially visualized. No sonographic Murphy sign noted by sonographer. Common bile duct: Diameter: 0.6 cm, within normal limits in caliber. Liver: No focal  lesion identified. Within normal limits in parenchymal echogenicity. IMPRESSION: 1. No acute abnormality seen at the right upper quadrant. 2. Gallbladder relatively decompressed. Cholecystostomy tube is partially characterized. Electronically Signed   By: JGarald BaldingM.D.   On: 09/01/2016 06:37    Impression/Plan: 1. Multiple Myeloma not having achieved remission. The patient has noticed some improvement in pain since radiotherapy to the spine. He will continue his pain management with Dr. GAlvy Bimler and return to clinic as needed.  2. Cholangitis. Dr. WRedmond Pullingwas contacted about the patient's elevated temp of 99.4, and we discussed his labwork by phone. He is on chronic steroids for his myeloma, but his lymphocytes are persistently elevated. He also is not having output from his choleycystotomy tube and Dr. WRedmond Pullingwill be ordering drain studies to compare.      ACarola Rhine PAC

## 2016-09-14 NOTE — Patient Outreach (Signed)
[facility]) Care Management  09/14/2016  Thomas [name] 06-28-1946 [id number]   CSW spoke with patient's wife today by phone. She reports the gallbladder drain is not draining adequatly and thus they have plans for outpatient surgery 10/13. "They are going to try to get the stone out'.   Advance Directive documents have not been completed as of yet "because of everything goiung on". Encouraged her to discuss and assist him with getting this done soon (before procedure if possible).   She also reports the Oncologist is still acting as the PCP and they still  have [place] RN coming out for home visits.   CSW will plan f/u call in 2 weeks.  Will update [facility] RN on plans for procedure next week.   Eduard Clos, MSW, [place] Worker  [place] [phone]

## 2016-09-14 NOTE — Telephone Encounter (Signed)
Called and spoke with Mr Thomas [name]. He has a scheduled visit with Dr. Redmond Pulling on 09/15/16 to assess his RUQ drain which demonstrates minimal output at this time.

## 2016-09-15 ENCOUNTER — Ambulatory Visit: Payer: Self-pay | Admitting: *Deleted

## 2016-09-15 ENCOUNTER — Other Ambulatory Visit: Payer: Self-pay | Admitting: General Surgery

## 2016-09-15 ENCOUNTER — Encounter (HOSPITAL_COMMUNITY): Payer: Self-pay | Admitting: Interventional Radiology

## 2016-09-15 ENCOUNTER — Ambulatory Visit (HOSPITAL_COMMUNITY)
Admission: RE | Admit: 2016-09-15 | Discharge: 2016-09-15 | Disposition: A | Discharge status: Home / Self Care | Payer: PPO | Source: Ambulatory Visit | Attending: General Surgery | Admitting: General Surgery

## 2016-09-15 ENCOUNTER — Ambulatory Visit: Payer: PPO | Admitting: Vascular Surgery

## 2016-09-15 DIAGNOSIS — Z4682 Encounter for fitting and adjustment of non-vascular catheter: Secondary | ICD-10-CM | POA: Insufficient documentation

## 2016-09-15 DIAGNOSIS — K819 Cholecystitis, unspecified: Secondary | ICD-10-CM

## 2016-09-15 HISTORY — PX: IR GENERIC HISTORICAL: IMG1180011

## 2016-09-15 MED ORDER — IOPAMIDOL (ISOVUE-300) INJECTION 61%
10.0000 mL | Freq: Once | INTRAVENOUS | Status: AC | PRN
  Administered 2016-09-15: 10 mL

## 2016-09-18 ENCOUNTER — Ambulatory Visit (HOSPITAL_BASED_OUTPATIENT_CLINIC_OR_DEPARTMENT_OTHER): Payer: PPO

## 2016-09-18 ENCOUNTER — Other Ambulatory Visit: Payer: Self-pay | Admitting: Hematology and Oncology

## 2016-09-18 ENCOUNTER — Other Ambulatory Visit (HOSPITAL_BASED_OUTPATIENT_CLINIC_OR_DEPARTMENT_OTHER): Payer: PPO

## 2016-09-18 DIAGNOSIS — C9000 Multiple myeloma not having achieved remission: Secondary | ICD-10-CM

## 2016-09-18 DIAGNOSIS — Z5112 Encounter for antineoplastic immunotherapy: Secondary | ICD-10-CM

## 2016-09-18 LAB — CBC WITH DIFFERENTIAL/PLATELET
BASO%: 0.1 % (ref 0.0–2.0)
BASOS ABS: 0 10*3/uL (ref 0.0–0.1)
EOS%: 0.4 % (ref 0.0–7.0)
Eosinophils Absolute: 0 10*3/uL (ref 0.0–0.5)
HCT: 26 % — ABNORMAL LOW (ref 38.4–49.9)
HEMOGLOBIN: 8.6 g/dL — AB (ref 13.0–17.1)
LYMPH%: 8.5 % — AB (ref 14.0–49.0)
MCH: 29.4 pg (ref 27.2–33.4)
MCHC: 33.1 g/dL (ref 32.0–36.0)
MCV: 88.7 fL (ref 79.3–98.0)
MONO#: 0.8 10*3/uL (ref 0.1–0.9)
MONO%: 8.4 % (ref 0.0–14.0)
NEUT#: 7.5 10*3/uL — ABNORMAL HIGH (ref 1.5–6.5)
NEUT%: 82.6 % — ABNORMAL HIGH (ref 39.0–75.0)
Platelets: 61 10*3/uL — ABNORMAL LOW (ref 140–400)
RBC: 2.93 10*6/uL — AB (ref 4.20–5.82)
RDW: 14.8 % — AB (ref 11.0–14.6)
WBC: 9 10*3/uL (ref 4.0–10.3)
lymph#: 0.8 10*3/uL — ABNORMAL LOW (ref 0.9–3.3)

## 2016-09-18 LAB — COMPREHENSIVE METABOLIC PANEL
ALBUMIN: 2.5 g/dL — AB (ref 3.5–5.0)
ALK PHOS: 108 U/L (ref 40–150)
ALT: 11 U/L (ref 0–55)
AST: 18 U/L (ref 5–34)
Anion Gap: 13 mEq/L — ABNORMAL HIGH (ref 3–11)
BUN: 7.6 mg/dL (ref 7.0–26.0)
CHLORIDE: 96 meq/L — AB (ref 98–109)
CO2: 26 meq/L (ref 22–29)
Calcium: 9.8 mg/dL (ref 8.4–10.4)
Creatinine: 1.9 mg/dL — ABNORMAL HIGH (ref 0.7–1.3)
EGFR: 35 mL/min/{1.73_m2} — AB (ref >90)
GLUCOSE: 130 mg/dL (ref 70–140)
POTASSIUM: 4 meq/L (ref 3.5–5.1)
SODIUM: 134 meq/L — AB (ref 136–145)
Total Bilirubin: 0.55 mg/dL (ref 0.20–1.20)
Total Protein: 10.5 g/dL — ABNORMAL HIGH (ref 6.4–8.3)

## 2016-09-18 MED ORDER — BORTEZOMIB CHEMO SQ INJECTION 3.5 MG (2.5MG/ML)
1.3000 mg/m2 | Freq: Once | INTRAMUSCULAR | Status: AC
  Administered 2016-09-18: 2.5 mg via SUBCUTANEOUS
  Filled 2016-09-18: qty 2.5

## 2016-09-18 MED ORDER — PROCHLORPERAZINE MALEATE 10 MG PO TABS
10.0000 mg | ORAL_TABLET | Freq: Once | ORAL | Status: AC
  Administered 2016-09-18: 10 mg via ORAL

## 2016-09-18 MED ORDER — PROCHLORPERAZINE MALEATE 10 MG PO TABS
ORAL_TABLET | ORAL | Status: AC
  Filled 2016-09-18: qty 1

## 2016-09-18 NOTE — Progress Notes (Signed)
OK to treat today with Platelet count 61.   Ok to proceed with future Velcade treatments despite Abnormal CBC and Renal function per Dr. Alvy Bimler and on treatment plan.

## 2016-09-18 NOTE — Patient Instructions (Signed)
[facility] Discharge Instructions for Patients Receiving Chemotherapy  Today you received the following chemotherapy agents Velcade. To help prevent nausea and vomiting after your treatment, we encourage you to take your nausea medication as directed.  If you develop nausea and vomiting that is not controlled by your nausea medication, call the clinic.   BELOW ARE SYMPTOMS THAT SHOULD BE REPORTED IMMEDIATELY:  *FEVER GREATER THAN 100.5 F  *CHILLS WITH OR WITHOUT FEVER  NAUSEA AND VOMITING THAT IS NOT CONTROLLED WITH YOUR NAUSEA MEDICATION  *UNUSUAL SHORTNESS OF BREATH  *UNUSUAL BRUISING OR BLEEDING  TENDERNESS IN MOUTH AND THROAT WITH OR WITHOUT PRESENCE OF ULCERS  *URINARY PROBLEMS  *BOWEL PROBLEMS  UNUSUAL RASH Items with * indicate a potential emergency and should be followed up as soon as possible.  Feel free to call the clinic you have any questions or concerns. The clinic phone number is [phone].  Please show the CHEMO ALERT CARD at check-in to the Emergency Department and triage nurse.    

## 2016-09-19 ENCOUNTER — Other Ambulatory Visit: Payer: Self-pay

## 2016-09-19 ENCOUNTER — Telehealth: Payer: Self-pay | Admitting: Internal Medicine

## 2016-09-19 ENCOUNTER — Other Ambulatory Visit: Payer: Self-pay | Admitting: Radiology

## 2016-09-19 ENCOUNTER — Telehealth: Payer: Self-pay | Admitting: *Deleted

## 2016-09-19 ENCOUNTER — Telehealth: Payer: Self-pay | Admitting: General Surgery

## 2016-09-19 ENCOUNTER — Other Ambulatory Visit: Payer: Self-pay | Admitting: General Surgery

## 2016-09-19 DIAGNOSIS — K819 Cholecystitis, unspecified: Secondary | ICD-10-CM

## 2016-09-19 DIAGNOSIS — D696 Thrombocytopenia, unspecified: Secondary | ICD-10-CM

## 2016-09-19 LAB — KAPPA/LAMBDA LIGHT CHAINS
Ig Kappa Free Light Chain: 9680.3 mg/L — ABNORMAL HIGH (ref 3.3–19.4)
Ig Lambda Free Light Chain: 28.1 mg/L — ABNORMAL HIGH (ref 5.7–26.3)
KAPPA/LAMBDA FLC RATIO: 344.49 — AB (ref 0.26–1.65)

## 2016-09-19 NOTE — Patient Outreach (Signed)
Transition of care call: Reviewed EMR records from today. This is a scheduled transition of care call today. Placed call and spoke with wife who reports that patient has had a rough day. States that she is not able to talk.   Request a call back later in the week.  PLAN: Will call patient and or wife back in 2 days.  Tomasa Rand, RN, BSN, CEN [facility] [employer] [phone]

## 2016-09-19 NOTE — Telephone Encounter (Signed)
Wife read label of "stronger" diarrhea medication and it is Lomotil.  Instructed wife to stop the imodium and give Lomotil instead.  Instructed to start now and give as directed up to 4 pills per day as needed for diarrhea.  Try to drink more fluid to avoid dehydration from the diarrhea. She verbalized understanding.    She reports thinks biliary drain is clogged because there is very little fluid draining into bag, but instead fluid is draining around the catheter out onto pt's skin.  She has contacted IR about this and they will see pt tomorrow to assess and treat.   Instructed wife to keep gauze around tube insertion site and change gauze often to keep skin clean and dry around site. She verbalized understanding.

## 2016-09-19 NOTE — Telephone Encounter (Signed)
Dr. Carlean Purl see recent labs please.  Platelet count is 61 please advise if ok to proceed with ERCP on 10/13

## 2016-09-19 NOTE — Progress Notes (Signed)
Patient's wife called about drainage around the catheter and diarrhea.  The patient had his drain placed over a month ago and came in Friday and had a cholangiogram.  This revealed that his distal CBD had a stone present.  His drain was capped and he was given a bag in case he were to develop pain.  He develop pain the following day and they attached the bag.  This relieved his pain, but he then began to develop leakage around his drainage tube.  On Sunday, he developed significant watery diarrhea, which has persisted.  He denies any fevers.  I have discussed this with Dr. Pascal Lux who would like for the patient to be brought in for a cholangiogram with possible drain exchange in case the drain is clogged.  I have called the wife back and informed her of this.  I have also informed her that she needs to call either the GI or surgery office to inform them of his diarrhea incase further intervention is needed.  She understands and is agreeable with the above.  Thomas [name] E 1:56 PM 09/19/2016

## 2016-09-19 NOTE — Telephone Encounter (Signed)
It may not be - Have him do a CBC on Thurs AM dx thrombocytopenia and I will decide if we can go ahead

## 2016-09-19 NOTE — Progress Notes (Signed)
Stone may still be there just not impacted so will go ahead with ERCP as planned

## 2016-09-19 NOTE — Telephone Encounter (Signed)
I spoke with the patient's wife over the telephone. I reinforced the importance of taking Lomotil to control the diarrhea. With recent pancytopenia, I recommend holding off further treatment. I am also concerned his myeloma panel is getting a little worse. I plan to meet up with the patient and family next week. With his low platelet count, if needed, I would recommend perioperative platelet transfusions With worsening myeloma, I will plan to switch his treatment to Daratumumab

## 2016-09-19 NOTE — Telephone Encounter (Signed)
Wffe left VM states pt having watery diarrhea at least 5 times today and it is "pure water."   Imodium is not helping, he has taken 4 or 5 pills already w/o any relief.  Wife says pt has Rx for "stronger" diarrhea medication

## 2016-09-19 NOTE — Telephone Encounter (Signed)
Wife notified of recommendations.  She will bring patient on Thursday am.  They understand that may have to delay or cancel ERCP based on Thursdays results.

## 2016-09-20 ENCOUNTER — Telehealth: Payer: Self-pay | Admitting: *Deleted

## 2016-09-20 ENCOUNTER — Inpatient Hospital Stay (HOSPITAL_COMMUNITY): Admission: RE | Admit: 2016-09-20 | Payer: PPO | Source: Ambulatory Visit

## 2016-09-20 LAB — MULTIPLE MYELOMA PANEL, SERUM
ALBUMIN/GLOB SERPL: 0.6 — AB (ref 0.7–1.7)
ALPHA 1: 0.5 g/dL — AB (ref 0.0–0.4)
Albumin SerPl Elph-Mcnc: 3.3 g/dL (ref 2.9–4.4)
Alpha2 Glob SerPl Elph-Mcnc: 1.3 g/dL — ABNORMAL HIGH (ref 0.4–1.0)
B-Globulin SerPl Elph-Mcnc: 0.9 g/dL (ref 0.7–1.3)
GAMMA GLOB SERPL ELPH-MCNC: 3.6 g/dL — AB (ref 0.4–1.8)
GLOBULIN, TOTAL: 6.3 g/dL — AB (ref 2.2–3.9)
IGA/IMMUNOGLOBULIN A, SERUM: 28 mg/dL — AB (ref 61–437)
IgG, Qn, Serum: 3492 mg/dL — ABNORMAL HIGH (ref 700–1600)
IgM, Qn, Serum: 21 mg/dL (ref 20–172)
M Protein SerPl Elph-Mcnc: 3.3 g/dL — ABNORMAL HIGH
Total Protein: 9.6 g/dL — ABNORMAL HIGH (ref 6.0–8.5)

## 2016-09-20 NOTE — Telephone Encounter (Signed)
Wife states pt has not had diarrhea since about 2 am but he also has not eaten anything this morning.  He is at Dialysis right now.  She asks if he has diarrhea again after he does eat should he take the imodium or "prescription" (lomotil)?   Instructed wife for pt to take the lomotil only up to 4 times per day for diarrhea.  The imodium did not work well for pt so just use the Lomotil and let us know if it does not help.  She verbalized understanding.

## 2016-09-21 ENCOUNTER — Other Ambulatory Visit (INDEPENDENT_AMBULATORY_CARE_PROVIDER_SITE_OTHER): Payer: PPO

## 2016-09-21 ENCOUNTER — Encounter (HOSPITAL_COMMUNITY): Payer: Self-pay | Admitting: *Deleted

## 2016-09-21 ENCOUNTER — Other Ambulatory Visit: Payer: Self-pay

## 2016-09-21 ENCOUNTER — Ambulatory Visit: Payer: PPO

## 2016-09-21 DIAGNOSIS — D696 Thrombocytopenia, unspecified: Secondary | ICD-10-CM

## 2016-09-21 LAB — CBC WITH DIFFERENTIAL/PLATELET
BASOS ABS: 0 10*3/uL (ref 0.0–0.1)
BASOS PCT: 0.3 % (ref 0.0–3.0)
Eosinophils Absolute: 0 10*3/uL (ref 0.0–0.7)
Eosinophils Relative: 0.6 % (ref 0.0–5.0)
Hemoglobin: 8.9 g/dL — ABNORMAL LOW (ref 13.0–17.0)
LYMPHS ABS: 0.6 10*3/uL — AB (ref 0.7–4.0)
LYMPHS PCT: 10.7 % — AB (ref 12.0–46.0)
MCHC: 33.5 g/dL (ref 30.0–36.0)
MCV: 86.7 fl (ref 78.0–100.0)
MONOS PCT: 13.2 % — AB (ref 3.0–12.0)
Monocytes Absolute: 0.7 10*3/uL (ref 0.1–1.0)
NEUTROS ABS: 4.1 10*3/uL (ref 1.4–7.7)
NEUTROS PCT: 75.2 % (ref 43.0–77.0)
PLATELETS: 64 10*3/uL — AB (ref 150.0–400.0)
RBC: 3.07 Mil/uL — ABNORMAL LOW (ref 4.22–5.81)
RDW: 15.6 % — AB (ref 11.5–15.5)
WBC: 5.4 10*3/uL (ref 4.0–10.5)

## 2016-09-21 NOTE — Patient Outreach (Signed)
Transition of care follow up: Placed call to wife to follow up from earlier call this week.  No answer. Left a message requesting a call back  PLAN: Will continue weekly outreach attempts.  Tomasa Rand, RN, BSN, CEN [facility] [employer] [phone]

## 2016-09-21 NOTE — Progress Notes (Signed)
PLTs of 64 K ok for ERCP tomorrow

## 2016-09-22 ENCOUNTER — Ambulatory Visit (HOSPITAL_COMMUNITY): Payer: PPO

## 2016-09-22 ENCOUNTER — Encounter (HOSPITAL_COMMUNITY): Payer: Self-pay | Admitting: *Deleted

## 2016-09-22 ENCOUNTER — Ambulatory Visit (HOSPITAL_COMMUNITY)
Admission: RE | Admit: 2016-09-22 | Discharge: 2016-09-22 | Disposition: A | Discharge status: Home / Self Care | Payer: PPO | Source: Ambulatory Visit | Attending: Internal Medicine | Admitting: Internal Medicine

## 2016-09-22 ENCOUNTER — Ambulatory Visit (HOSPITAL_COMMUNITY): Payer: PPO | Admitting: Anesthesiology

## 2016-09-22 ENCOUNTER — Encounter (HOSPITAL_COMMUNITY)
Admission: RE | Disposition: A | Discharge status: Home / Self Care | Payer: Self-pay | Source: Ambulatory Visit | Attending: Internal Medicine

## 2016-09-22 DIAGNOSIS — Z79899 Other long term (current) drug therapy: Secondary | ICD-10-CM | POA: Diagnosis not present

## 2016-09-22 DIAGNOSIS — Z885 Allergy status to narcotic agent status: Secondary | ICD-10-CM | POA: Diagnosis not present

## 2016-09-22 DIAGNOSIS — D61810 Antineoplastic chemotherapy induced pancytopenia: Secondary | ICD-10-CM | POA: Diagnosis not present

## 2016-09-22 DIAGNOSIS — Z833 Family history of diabetes mellitus: Secondary | ICD-10-CM | POA: Diagnosis not present

## 2016-09-22 DIAGNOSIS — E1122 Type 2 diabetes mellitus with diabetic chronic kidney disease: Secondary | ICD-10-CM | POA: Diagnosis not present

## 2016-09-22 DIAGNOSIS — I120 Hypertensive chronic kidney disease with stage 5 chronic kidney disease or end stage renal disease: Secondary | ICD-10-CM | POA: Diagnosis not present

## 2016-09-22 DIAGNOSIS — Z9049 Acquired absence of other specified parts of digestive tract: Secondary | ICD-10-CM | POA: Insufficient documentation

## 2016-09-22 DIAGNOSIS — K8050 Calculus of bile duct without cholangitis or cholecystitis without obstruction: Secondary | ICD-10-CM | POA: Diagnosis not present

## 2016-09-22 DIAGNOSIS — Z87891 Personal history of nicotine dependence: Secondary | ICD-10-CM | POA: Insufficient documentation

## 2016-09-22 DIAGNOSIS — N186 End stage renal disease: Secondary | ICD-10-CM | POA: Diagnosis not present

## 2016-09-22 DIAGNOSIS — Z888 Allergy status to other drugs, medicaments and biological substances status: Secondary | ICD-10-CM | POA: Diagnosis not present

## 2016-09-22 DIAGNOSIS — J189 Pneumonia, unspecified organism: Secondary | ICD-10-CM | POA: Diagnosis not present

## 2016-09-22 DIAGNOSIS — Z992 Dependence on renal dialysis: Secondary | ICD-10-CM | POA: Insufficient documentation

## 2016-09-22 DIAGNOSIS — Z794 Long term (current) use of insulin: Secondary | ICD-10-CM | POA: Diagnosis not present

## 2016-09-22 DIAGNOSIS — Z91013 Allergy to seafood: Secondary | ICD-10-CM | POA: Diagnosis not present

## 2016-09-22 DIAGNOSIS — K8051 Calculus of bile duct without cholangitis or cholecystitis with obstruction: Secondary | ICD-10-CM | POA: Insufficient documentation

## 2016-09-22 DIAGNOSIS — D696 Thrombocytopenia, unspecified: Secondary | ICD-10-CM | POA: Insufficient documentation

## 2016-09-22 DIAGNOSIS — Z880 Allergy status to penicillin: Secondary | ICD-10-CM | POA: Diagnosis not present

## 2016-09-22 DIAGNOSIS — C9000 Multiple myeloma not having achieved remission: Secondary | ICD-10-CM | POA: Insufficient documentation

## 2016-09-22 DIAGNOSIS — K838 Other specified diseases of biliary tract: Secondary | ICD-10-CM

## 2016-09-22 HISTORY — PX: ERCP: SHX5425

## 2016-09-22 HISTORY — DX: Pneumonia, unspecified organism: J18.9

## 2016-09-22 HISTORY — DX: Gastro-esophageal reflux disease without esophagitis: K21.9

## 2016-09-22 HISTORY — DX: Hypotension, unspecified: I95.9

## 2016-09-22 HISTORY — DX: Unspecified osteoarthritis, unspecified site: M19.90

## 2016-09-22 LAB — GLUCOSE, CAPILLARY: GLUCOSE-CAPILLARY: 81 mg/dL (ref 65–99)

## 2016-09-22 SURGERY — ERCP, WITH INTERVENTION IF INDICATED
Anesthesia: General

## 2016-09-22 MED ORDER — PROPOFOL 10 MG/ML IV BOLUS
INTRAVENOUS | Status: DC | PRN
  Administered 2016-09-22: 120 mg via INTRAVENOUS

## 2016-09-22 MED ORDER — CIPROFLOXACIN IN D5W 400 MG/200ML IV SOLN
INTRAVENOUS | Status: AC
  Filled 2016-09-22: qty 200

## 2016-09-22 MED ORDER — GLUCAGON HCL RDNA (DIAGNOSTIC) 1 MG IJ SOLR
INTRAMUSCULAR | Status: AC
  Filled 2016-09-22: qty 1

## 2016-09-22 MED ORDER — NEOSTIGMINE METHYLSULFATE 10 MG/10ML IV SOLN
INTRAVENOUS | Status: DC | PRN
  Administered 2016-09-22: 3 mg via INTRAVENOUS

## 2016-09-22 MED ORDER — SODIUM CHLORIDE 0.9 % IV SOLN
INTRAVENOUS | Status: DC | PRN
  Administered 2016-09-22: 25 mL

## 2016-09-22 MED ORDER — ROCURONIUM BROMIDE 10 MG/ML (PF) SYRINGE
PREFILLED_SYRINGE | INTRAVENOUS | Status: DC | PRN
  Administered 2016-09-22: 30 mg via INTRAVENOUS

## 2016-09-22 MED ORDER — PHENYLEPHRINE HCL 10 MG/ML IJ SOLN
INTRAMUSCULAR | Status: DC | PRN
  Administered 2016-09-22: 120 ug via INTRAVENOUS

## 2016-09-22 MED ORDER — GLYCOPYRROLATE 0.2 MG/ML IV SOSY
PREFILLED_SYRINGE | INTRAVENOUS | Status: DC | PRN
  Administered 2016-09-22: 0.4 mg via INTRAVENOUS

## 2016-09-22 MED ORDER — IOPAMIDOL (ISOVUE-300) INJECTION 61%
INTRAVENOUS | Status: AC
  Filled 2016-09-22: qty 50

## 2016-09-22 MED ORDER — INDOMETHACIN 50 MG RE SUPP: 100.0000 mg | Freq: Once | RECTAL | Status: DC

## 2016-09-22 MED ORDER — LIDOCAINE 2% (20 MG/ML) 5 ML SYRINGE
INTRAMUSCULAR | Status: DC | PRN
  Administered 2016-09-22: 100 mg via INTRAVENOUS

## 2016-09-22 MED ORDER — FENTANYL CITRATE (PF) 100 MCG/2ML IJ SOLN
INTRAMUSCULAR | Status: DC | PRN
  Administered 2016-09-22 (×2): 50 ug via INTRAVENOUS

## 2016-09-22 MED ORDER — INDOMETHACIN 50 MG RE SUPP
RECTAL | Status: AC
  Filled 2016-09-22: qty 2

## 2016-09-22 MED ORDER — ONDANSETRON HCL 4 MG/2ML IJ SOLN
INTRAMUSCULAR | Status: DC | PRN
  Administered 2016-09-22: 4 mg via INTRAVENOUS

## 2016-09-22 MED ORDER — CIPROFLOXACIN IN D5W 400 MG/200ML IV SOLN
400.0000 mg | Freq: Once | INTRAVENOUS | Status: AC
  Administered 2016-09-22: 400 mg via INTRAVENOUS

## 2016-09-22 MED ORDER — SODIUM CHLORIDE 0.9 % IV SOLN
INTRAVENOUS | Status: DC
Start: 2016-09-22 — End: 2016-09-22
  Administered 2016-09-22: 10:00:00 via INTRAVENOUS

## 2016-09-22 MED ORDER — INDOMETHACIN 50 MG RE SUPP
RECTAL | Status: DC | PRN
  Administered 2016-09-22: 100 mg via RECTAL

## 2016-09-22 MED ORDER — DEXTROSE 5 % IV SOLN
INTRAVENOUS | Status: DC | PRN
  Administered 2016-09-22: 75 ug/min via INTRAVENOUS

## 2016-09-22 NOTE — Anesthesia Procedure Notes (Signed)
Procedure Name: Intubation Date/Time: 09/22/2016 11:58 AM Performed by: Kyung Rudd Pre-anesthesia Checklist: Patient identified, Emergency Drugs available, Suction available and Patient being monitored Patient Re-evaluated:Patient Re-evaluated prior to inductionOxygen Delivery Method: Circle system utilized Preoxygenation: Pre-oxygenation with 100% oxygen Intubation Type: IV induction Ventilation: Mask ventilation without difficulty Laryngoscope Size: Mac and 4 Grade View: Grade I Tube type: Oral Tube size: 7.5 mm Number of attempts: 1 Airway Equipment and Method: Stylet Placement Confirmation: ETT inserted through vocal cords under direct vision,  positive ETCO2 and breath sounds checked- equal and bilateral Secured at: 22 cm Tube secured with: Tape Dental Injury: Teeth and Oropharynx as per pre-operative assessment

## 2016-09-22 NOTE — Anesthesia Postprocedure Evaluation (Signed)
Anesthesia Post Note  Patient: Alexa Pandya  Procedure(s) Performed: Procedure(s) (LRB): ENDOSCOPIC RETROGRADE CHOLANGIOPANCREATOGRAPHY (ERCP) (N/A)  Patient location during evaluation: PACU Anesthesia Type: General Level of consciousness: awake and alert Pain management: pain level controlled Vital Signs Assessment: post-procedure vital signs reviewed and stable Respiratory status: spontaneous breathing, nonlabored ventilation, respiratory function stable and patient connected to nasal cannula oxygen Cardiovascular status: blood pressure returned to baseline and stable Postop Assessment: no signs of nausea or vomiting Anesthetic complications: no    Last Vitals:  Vitals:   09/22/16 1310 09/22/16 1320  BP: 126/61 134/84  Pulse: 87 90  Resp: 18 (!) 23  Temp:      Last Pain:  Vitals:   09/22/16 1302  TempSrc: Oral                 Krishav Mamone S

## 2016-09-22 NOTE — Anesthesia Preprocedure Evaluation (Signed)
Anesthesia Evaluation  Patient identified by MRN, date of birth, ID band Patient awake    Reviewed: Allergy & Precautions, NPO status , Patient's Chart, lab work & pertinent test results  Airway Mallampati: II  TM Distance: >3 FB Neck ROM: Full    Dental no notable dental hx.    Pulmonary neg pulmonary ROS, former smoker,    Pulmonary exam normal breath sounds clear to auscultation       Cardiovascular hypertension, negative cardio ROS Normal cardiovascular exam Rhythm:Regular Rate:Normal     Neuro/Psych negative neurological ROS  negative psych ROS   GI/Hepatic negative GI ROS, Neg liver ROS,   Endo/Other  negative endocrine ROS  Renal/GU ESRFRenal disease  negative genitourinary   Musculoskeletal negative musculoskeletal ROS (+)   Abdominal   Peds negative pediatric ROS (+)  Hematology Multiple myeloma   Anesthesia Other Findings   Reproductive/Obstetrics negative OB ROS                             Anesthesia Physical Anesthesia Plan  ASA: III  Anesthesia Plan: General   Post-op Pain Management:    Induction: Intravenous  Airway Management Planned: Oral ETT  Additional Equipment:   Intra-op Plan:   Post-operative Plan: Extubation in OR  Informed Consent: I have reviewed the patients History and Physical, chart, labs and discussed the procedure including the risks, benefits and alternatives for the proposed anesthesia with the patient or authorized representative who has indicated his/her understanding and acceptance.   Dental advisory given  Plan Discussed with: CRNA and Surgeon  Anesthesia Plan Comments:         Anesthesia Quick Evaluation

## 2016-09-22 NOTE — Transfer of Care (Signed)
Immediate Anesthesia Transfer of Care Note  Patient: Thomas [name]  Procedure(s) Performed: Procedure(s): ENDOSCOPIC RETROGRADE CHOLANGIOPANCREATOGRAPHY (ERCP) (N/A)  Patient Location: Endoscopy Unit  Anesthesia Type:General  Level of Consciousness: awake, alert  and oriented  Airway & Oxygen Therapy: Patient Spontanous Breathing and Patient connected to nasal cannula oxygen  Post-op Assessment: Report given to RN, Post -op Vital signs reviewed and stable and Patient moving all extremities  Post vital signs: Reviewed and stable  Last Vitals:  Vitals:   09/22/16 1014 09/22/16 1302  BP: (!) 163/69 119/64  Pulse: 88 81  Resp: 17 18  Temp: 36.6 C 36.8 C    Last Pain:  Vitals:   09/22/16 1302  TempSrc: Oral         Complications: No apparent anesthesia complications

## 2016-09-22 NOTE — Discharge Instructions (Signed)
YOU HAD AN ENDOSCOPIC PROCEDURE TODAY: Refer to the procedure report and other information in the discharge instructions given to you for any specific questions about what was found during the examination. If this information does not answer your questions, please call [place] office at [phone] to clarify.  ° °YOU SHOULD EXPECT: Some feelings of bloating in the abdomen. Passage of more gas than usual. Walking can help get rid of the air that was put into your GI tract during the procedure and reduce the bloating. If you had a lower endoscopy (such as a colonoscopy or flexible sigmoidoscopy) you may notice spotting of blood in your stool or on the toilet paper. Some abdominal soreness may be present for a day or two, also. ° °DIET: Your first meal following the procedure should be a light meal and then it is ok to progress to your normal diet. A half-sandwich or bowl of soup is an example of a good first meal. Heavy or fried foods are harder to digest and may make you feel nauseous or bloated. Drink plenty of fluids but you should avoid alcoholic beverages for 24 hours. If you had a esophageal dilation, please see attached instructions for diet.   ° °ACTIVITY: Your care partner should take you home directly after the procedure. You should plan to take it easy, moving slowly for the rest of the day. You can resume normal activity the day after the procedure however YOU SHOULD NOT DRIVE, use power tools, machinery or perform tasks that involve climbing or major physical exertion for 24 hours (because of the sedation medicines used during the test).  ° °SYMPTOMS TO REPORT IMMEDIATELY: °A gastroenterologist can be reached at any hour. Please call [phone]  for any of the following symptoms:  °Following lower endoscopy (colonoscopy, flexible sigmoidoscopy) °Excessive amounts of blood in the stool  °Significant tenderness, worsening of abdominal pains  °Swelling of the abdomen that is new, acute  °Fever of 100° or  higher  °Following upper endoscopy (EGD, EUS, ERCP, esophageal dilation) °Vomiting of blood or coffee ground material  °New, significant abdominal pain  °New, significant chest pain or pain under the shoulder blades  °Painful or persistently difficult swallowing  °New shortness of breath  °Black, tarry-looking or red, bloody stools ° °FOLLOW UP:  °If any biopsies were taken you will be contacted by phone or by letter within the next 1-3 weeks. Call [phone]  if you have not heard about the biopsies in 3 weeks.  °Please also call with any specific questions about appointments or follow up tests. ° °

## 2016-09-22 NOTE — Op Note (Addendum)
[facility] Patient Name: Thomas [name] Procedure Date : 09/22/2016 MRN: [id number] Attending MD: Gatha Mayer , MD Date of Birth: [birth year] CSN: [id number] Age: 71 Admit Type: Outpatient Procedure:                ERCP Indications:              Bile duct stone(s) Providers:                Gatha Mayer, MD, Cleda Daub, RN, Corliss Parish, Technician Referring MD:              Medicines:                General Anesthesia Complications:            No immediate complications. Estimated Blood Loss:     Estimated blood loss: none. Procedure:                Pre-Anesthesia Assessment:                           - Prior to the procedure, a History and Physical                            was performed, and patient medications and                            allergies were reviewed. The patient's tolerance of                            previous anesthesia was also reviewed. The risks                            and benefits of the procedure and the sedation                            options and risks were discussed with the patient.                            All questions were answered, and informed consent                            was obtained. Prior Anticoagulants: The patient has                            taken no previous anticoagulant or antiplatelet                            agents. ASA Grade Assessment: III - A patient with                            severe systemic disease. After reviewing the risks  and benefits, the patient was deemed in                            satisfactory condition to undergo the procedure.                           After obtaining informed consent, the scope was                            passed under direct vision. Throughout the                            procedure, the patient's blood pressure, pulse, and                            oxygen saturations were monitored continuously.  The                            [id number] ([id number]) scope was introduced through                            the mouth, and used to inject contrast into and                            used to inject contrast into the bile duct. The                            ERCP was accomplished without difficulty. The                            patient tolerated the procedure well. Scope In: Scope Out: Findings:      The scout film was normal. The esophagus was successfully intubated       under direct vision. The scope was advanced to a normal major papilla in       the descending duodenum without detailed examination of the pharynx,       larynx and associated structures, and upper GI tract. The upper GI tract       was grossly normal. The bile duct was deeply cannulated with the       short-nosed traction sphincterotome and a Jagwire. Contrast was       injected. I personally interpreted the bile duct images. There was       appropriate flow of contrast through the ducts. Image quality was       excellent. Contrast extended to the hepatic ducts. Opacification of the       entire biliary tree except for the gallbladder was successful. The       maximum diameter of the ducts was 12 mm. The main bile duct was       diffusely dilated. The largest diameter was 12 mm. Based upon known       stone on PTC throgh percutaneous cholecystostomy a sphincterotomy was       performed. A amsll stone exited spontaneously. A 12 mm balloon sweep       removed large amount of sludge with negative occlusion cholangiogram at  end. Cystic duct filled some - gallbladder did not fill well.       Cholecystostomy tube in place. No pancreatogram by intent. Impression:               - The entire main bile duct was dilated.                           - Small bile duct stone removed with sphincterotomy                            and sludge removed with balloon sweeps. Recommendation:           - Avoid aspirin and nonsteroidal  anti-inflammatory                            medicines for 2 weeks.                           - Follow-up with Dr. Redmond Pulling by phone to have                            cholecystostomy tube removal scheduled.                           - Return to my office PRN.                           Will cc: Dr. Redmond Pulling Procedure Code(s):        --- Professional ---                           [phone], Endoscopic retrograde                            cholangiopancreatography (ERCP); with removal of                            calculi/debris from biliary/pancreatic duct(s)                           [phone], Endoscopic retrograde                            cholangiopancreatography (ERCP); with                            sphincterotomy/papillotomy Diagnosis Code(s):        --- Professional ---                           K80.50, Calculus of bile duct without cholangitis                            or cholecystitis without obstruction                           K83.8, Other specified diseases of biliary tract CPT copyright 2016 [employer]. All rights  reserved. The codes documented in this report are preliminary and upon coder review may  be revised to meet current compliance requirements. Gatha Mayer, MD 09/22/2016 1:11:02 PM This report has been signed electronically. Number of Addenda: 0

## 2016-09-22 NOTE — Interval H&P Note (Signed)
History and Physical Interval Note:  09/22/2016 11:36 AM  Thomas [name]  has presented today for surgery, with the diagnosis of common bile duct stone  The various methods of treatment have been discussed with the patient and family. After consideration of risks, benefits and other options for treatment, the patient has consented to  Procedure(s): ENDOSCOPIC RETROGRADE CHOLANGIOPANCREATOGRAPHY (ERCP) (N/A) as a surgical intervention .  The patient's history has been reviewed, patient examined, no change in status, stable for surgery.  I have reviewed the patient's chart and labs.  Questions were answered to the patient's satisfaction.     Silvano Rusk

## 2016-09-22 NOTE — H&P (View-Only) (Signed)
 Thomas [name] 70 y.o. 10/11/1946 [id number]  Assessment & Plan:   1. Common bile duct stone   2. Left lower lobe pneumonia   3. Multiple myeloma not having achieved remission (HCC)     Available information including the most recent percutaneous cholangiogram, prior MRI MRCP and previous cholangiogram suggested a stone has dropped from the gallbladder the common bile duct and is causing obstruction. Fortunately with his percutaneous cholecystostomy he is drained. He is not symptomatic from this.  He has numerous comorbidities, he is getting over pneumonia not yet over that though he is improved, he has had pancytopenia issues from chemotherapy for multiple myeloma and has had his regimen adjusted so that not so much of a problem now. He is in a weakend a debilitated state.  I am planning for an ERCP with stone extraction in a couple of weeks. I think this should be okay. I want to make sure he recovers from his pneumonia, I will notify Dr. Grosse itch, I wonder if he might not need follow-up imaging with the left lower lobe collapse though he is significantly clinically improved. I don't think multiple myeloma would necessarily involve the lungs, I am a little bit concerned about the report of a left lower lobe collapse on the chest x-ray. Question needs other imaging.  The risks and benefits as well as alternatives of endoscopic procedure(s) have been discussed and reviewed. All questions answered. The patient agrees to proceed.  I appreciate the opportunity to care for this patient. I will send copies to Dr. Eric Wilson and Dr. Ni Gorsuch  Subjective:   Chief Complaint: Abnormal cholangiogram  HPI The patient is a very nice 70-year-old white man with a percutaneous cholecystostomy tube in because of cholecystitis several months ago. On routine follow-up cholangiogram there was obstruction of the distal common bile duct suspicious for a stone though not certain. The patient denies any  abdominal pain. He was hospitalized recently and just discharged a couple of days ago with pneumonia. He had a left lower lobe collapse. He is improving significantly on antibiotics.  Allergies  Allergen Reactions  . Penicillins Rash and Other (See Comments)    Has patient had a PCN reaction causing immediate rash, facial/tongue/throat swelling, SOB or lightheadedness with hypotension: YES + Reaction causing SEVERE RASH involving MUCUS MEMBRANES or SKIN NECROSIS >> YES Reaction that required hospitalization: NO Reaction occurring within the last 10 years: NO If all of the above answers are "NO", then may proceed with Cephalosporin use.   . Dilaudid [Hydromorphone Hcl] Other (See Comments)    hallucinations  . Fish Allergy Nausea And Vomiting  . Codeine Other (See Comments)    "MAKES ME JUMPY"   Outpatient Medications Prior to Visit  Medication Sig Dispense Refill  . acetaminophen (TYLENOL) 325 MG tablet Take 2 tablets (650 mg total) by mouth every 6 (six) hours as needed for mild pain (or Fever >/= 101). 15 tablet 0  . acyclovir (ZOVIRAX) 200 MG capsule Take 1 capsule (200 mg total) by mouth 2 (two) times daily. 60 capsule 9  . ALPRAZolam (XANAX) 0.25 MG tablet Take 1 tablet (0.25 mg total) by mouth 2 (two) times daily as needed for anxiety. 10 tablet 0  . bisacodyl (DULCOLAX) 10 MG suppository Place 1 suppository (10 mg total) rectally daily as needed for moderate constipation. 12 suppository 0  . calcium acetate (PHOSLO) 667 MG capsule Take 1 capsule (667 mg total) by mouth 2 (two) times daily with a meal. 60   capsule 1  . Cholecalciferol (VITAMIN D3) 50000 units CAPS Take 1 capsule by mouth once a week.    . cyanocobalamin 1000 MCG tablet Take 1 tablet (1,000 mcg total) by mouth daily. 30 tablet 0  . dexamethasone (DECADRON) 2 MG tablet Take 2 mg by mouth daily.    . insulin aspart (NOVOLOG) 100 UNIT/ML injection Before each meal 3 times a day, 140-199 - 2 units, 200-250 - 4 units,  251-299 - 6 units,  300-349 - 8 units,  350 or above 10 units. Dispense syringes and needles as needed, Ok to switch to PEN if approved. Substitute to any brand approved. DX DM2, Code E11.65 1 vial 12  . levofloxacin (LEVAQUIN) 500 MG tablet Take 1 tablet (500 mg total) by mouth every other day. 3 tablet 0  . loratadine (CLARITIN) 10 MG tablet Take 10 mg by mouth daily.     . midodrine (PROAMATINE) 10 MG tablet Take 1 tablet (10 mg total) by mouth 3 (three) times daily with meals.    . multivitamin (RENA-VIT) TABS tablet Take 1 tablet by mouth at bedtime. 30 tablet 0  . ondansetron (ZOFRAN) 8 MG tablet Take 1 tablet (8 mg total) by mouth every 8 (eight) hours as needed for nausea. 30 tablet 3  . oxyCODONE (OXY IR/ROXICODONE) 5 MG immediate release tablet Take 1 tablet (5 mg total) by mouth every 4 (four) hours as needed for moderate pain. 10 tablet 0  . pantoprazole (PROTONIX) 40 MG tablet Take 1 tablet (40 mg total) by mouth daily. 30 tablet 1  . polyethylene glycol (MIRALAX / GLYCOLAX) packet Take 17 g by mouth daily as needed for mild constipation or moderate constipation. 14 each 0  . protein supplement (RESOURCE BENEPROTEIN) POWD Take 6 g by mouth 3 (three) times daily with meals.  0  . saccharomyces boulardii (FLORASTOR) 250 MG capsule Take 1 capsule (250 mg total) by mouth 2 (two) times daily. 10 capsule 0  . triamcinolone (NASACORT AQ) 55 MCG/ACT AERO nasal inhaler Place 2 sprays into the nose daily. 1 Inhaler 1   No facility-administered medications prior to visit.    Past Medical History:  Diagnosis Date  . Anemia    ANEMIA OF RENAL DISEASE  . Chronic kidney disease   . Compression fracture   . Dialysis patient (HCC)   . Elevated LFTs 07/2016  . Gall stones   . Multiple myeloma (HCC)   . Thrombocytopenia (HCC) 07/2016   Past Surgical History:  Procedure Laterality Date  . AV FISTULA PLACEMENT Left 05/24/2016   Procedure: ARTERIOVENOUS (AV) FISTULA CREATION-LEFT;  Surgeon: Brian  L Chen, MD;  Location: [facility] OR;  Service: Vascular;  Laterality: Left;  . BONE BIOPSY Left 05/14/2016   Procedure: LEFT FEMORAL BIOPSY WITH INTRAOPERATIVE FROZEN SECTIONS ;  Surgeon: Brian Swinteck, MD;  Location: [facility] OR;  Service: Orthopedics;  Laterality: Left;  . FEMUR IM NAIL Left 05/14/2016   Procedure: INTRAMEDULLARY (IM) NAIL FEMORAL;  Surgeon: Brian Swinteck, MD;  Location: [facility] OR;  Service: Orthopedics;  Laterality: Left;  . IR GENERIC HISTORICAL  07/17/2016   IR PERC CHOLECYSTOSTOMY 07/17/2016 Daniel Hassell, MD [facility]-INTERV RAD  . IR GENERIC HISTORICAL  08/30/2016   IR SINUS/FIST TUBE CHK-NON GI 08/30/2016 Michael Shick, MD [facility]-INTERV RAD  . MOUTH SURGERY    . SP CHOLECYSTOMY  07/2016   cholecystostomy drain placed under US and fluoro   Social History   Social History  . Marital status: Married    Spouse name: N/A  .   Number of children: N/A  . Years of education: N/A   Social History Main Topics  . Smoking status: Former Smoker  . Smokeless tobacco: Never Used  . Alcohol use No  . Drug use: No  . Sexual activity: Not Asked   Other Topics Concern  . None   Social History Narrative  . None   Family History  Problem Relation Age of Onset  . Diabetes Father        Review of Systems He is weak and fatigued. His cough is less productive and lasts. He is less short of breath.  Objective:   Physical Exam BP (!) 146/82 (BP Location: Right Arm, Patient Position: Sitting, Cuff Size: Normal)   Pulse 80   Ht 6' 0.02" (1.829 m)   Wt 143 lb 1.3 oz (64.9 kg)   BMI 19.39 kg/m   He is chronically ill Eyes are anicteric  some coarse breath sounds but I hear no significant rales and there are no significant decreased breath sounds or dullness to percussion Heart distant S1-S2 Abdomen there is a percutaneous cholecystostomy tube in place in the right upper quadrant without complication he is nontender He has an appropriate and upbeat mood and affect and is alert and oriented 3  I  have reviewed recent hospitalization records his chest x-ray report labs. Lab Results  Component Value Date   WBC 9.2 09/02/2016   HGB 9.3 (L) 09/02/2016   HCT 29.6 (L) 09/02/2016   MCV 92.2 09/02/2016   PLT 75 (L) 09/02/2016      

## 2016-09-25 ENCOUNTER — Telehealth: Payer: Self-pay | Admitting: Hematology and Oncology

## 2016-09-25 ENCOUNTER — Other Ambulatory Visit: Payer: PPO

## 2016-09-25 ENCOUNTER — Ambulatory Visit (HOSPITAL_BASED_OUTPATIENT_CLINIC_OR_DEPARTMENT_OTHER): Payer: PPO | Admitting: Hematology and Oncology

## 2016-09-25 ENCOUNTER — Encounter (HOSPITAL_COMMUNITY): Payer: Self-pay | Admitting: Internal Medicine

## 2016-09-25 VITALS — BP 112/47 | HR 100 | Temp 97.9°F | Resp 18 | Ht 74.0 in

## 2016-09-25 DIAGNOSIS — T827XXA Infection and inflammatory reaction due to other cardiac and vascular devices, implants and grafts, initial encounter: Secondary | ICD-10-CM

## 2016-09-25 DIAGNOSIS — S72302S Unspecified fracture of shaft of left femur, sequela: Secondary | ICD-10-CM

## 2016-09-25 DIAGNOSIS — K819 Cholecystitis, unspecified: Secondary | ICD-10-CM

## 2016-09-25 DIAGNOSIS — D61810 Antineoplastic chemotherapy induced pancytopenia: Secondary | ICD-10-CM | POA: Diagnosis not present

## 2016-09-25 DIAGNOSIS — C9000 Multiple myeloma not having achieved remission: Secondary | ICD-10-CM

## 2016-09-25 DIAGNOSIS — Z7189 Other specified counseling: Secondary | ICD-10-CM

## 2016-09-25 DIAGNOSIS — Z992 Dependence on renal dialysis: Secondary | ICD-10-CM

## 2016-09-25 DIAGNOSIS — N186 End stage renal disease: Secondary | ICD-10-CM

## 2016-09-25 DIAGNOSIS — R197 Diarrhea, unspecified: Secondary | ICD-10-CM

## 2016-09-25 DIAGNOSIS — T451X5A Adverse effect of antineoplastic and immunosuppressive drugs, initial encounter: Secondary | ICD-10-CM

## 2016-09-25 DIAGNOSIS — S72032S Displaced midcervical fracture of left femur, sequela: Secondary | ICD-10-CM

## 2016-09-25 DIAGNOSIS — Z789 Other specified health status: Secondary | ICD-10-CM | POA: Insufficient documentation

## 2016-09-25 DIAGNOSIS — E538 Deficiency of other specified B group vitamins: Secondary | ICD-10-CM

## 2016-09-25 DIAGNOSIS — E440 Moderate protein-calorie malnutrition: Secondary | ICD-10-CM

## 2016-09-25 MED ORDER — MIDODRINE HCL 10 MG PO TABS: 10.0000 mg | ORAL_TABLET | Freq: Three times a day (TID) | ORAL | 6 refills | Status: DC

## 2016-09-25 NOTE — Assessment & Plan Note (Signed)
Unfortunately, the patient have evidence of disease progression. I suspect is due to frequent interruption of his treatment given his other major comorbidities. The patient has significant decline in overall performance status with recurrent infection and currently is oliguric. I reviewed all the blood work and treatment recommendation. I will hold off starting him on treatment because his nephrologist is starting him on IV antibiotic therapy to treat potential line infection. I will tentatively schedule him to start treatment on 10/10/2016. Treatment will be strictly palliative. The decision was made based on publication at the [facility].  Targeting CD38 with Daratumumab Monotherapy in Multiple Myeloma Henk M. Nikki Dom, M.D., Ph.D., Lisabeth Pick, M.D., Edyth Gunnels. Serafina Mitchell, M.D., Carolanne Grumbling, M.D., Ph.D., Lelon Frohlich, M.D., Ph.D., Towanda Malkin, M.D., Ph.D., Monique C. Linus Galas, M.D., Ph.D., Harden Mo, M.D., Ph.D., Rudolpho Sevin, M.D., Lucienne Capers, M.D., Stefano Gaul W.C.J. Mady Haagensen, M.D., Ph.D., Lynetta Mare, M.D., Ph.D., Ella Jubilee, M.D., Ph.D., Darol Destine. Rozann Lesches, Ph.D., Mayer Masker, Ph.D., Levy Pupa, Ph.D., Pearletha Forge, M.[place]., Candie Mile, M.D., Godfrey Pick, M.D., Hassan Rowan, M.D., Ph.D., and Loyal Gambler. Marvel Plan, M.D. Alison Stalling J Med [phone] 24, 2015  The chemotherapy consists of daratumumab, a humanized monoclonal antibody against CD 38.  According to the publication, there were 2 complete response is noted in the group of 32 patients treated.. Infusion reactions are common side effects.  Some of the short term side-effects included, though not limited to, risk of fatigue, weight loss, tumor lysis syndrome, risk of allergic reactions, pancytopenia, life-threatening infections, need for transfusions of blood products, admission to hospital for various reasons, and risks of death.   The patient is aware that the response rates discussed earlier is not guaranteed.     After a long discussion, patient made an informed decision to proceed with the prescribed plan of care.  Estimated response rate with this regimen is roughly 30%. That could be combined with possible Revlimid in the future if his pancytopenia improves. I will stop daily dexamethasone and switch him to pulse dexamethasone on Sundays, at 20 mg once a week along with acyclovir as antimicrobial prophylaxis. I will not start him on Zometa due to renal failure and inability to obtain dental clearance. I will see him back in 2 weeks for further assessment

## 2016-09-25 NOTE — Progress Notes (Signed)
[place] OFFICE PROGRESS NOTE  Patient Care Team: No Pcp Per Patient as PCP - General (General Practice) Thana Ates, RN as [place], LCSW as Social Worker  SUMMARY OF ONCOLOGIC HISTORY:   Multiple myeloma not having achieved remission ([place])   05/09/2016 - 06/07/2016 Hospital Admission    The patient was hospitalized for pancytopenia, acute renal failure and back pain. He was diagnosed with multiple myeloma requiring multiple surgery and hemodialysis. He was started on chemotherapy while hospitalized      05/11/2016 Pathology Results    Accession: [id number] kidney biopsy confimed myeloma involvement      05/12/2016 Imaging    Skeletal survey showed multiple bone lesions are suggestive for metastatic bone disease and suspicious for multiple myeloma. There is imminent pathological fracture of the left femur      05/14/2016 Pathology Results    Accession: [id number] biopsy from left femur confirmed myeloma      05/16/2016 Pathology Results    Bone marrow complex cytogenetics. Please see report      05/16/2016 Imaging    MRI spine: large 6.0 x 9.0 x 7.5 cm lesion with associated extraosseous extension of tumor into the left paraspinous soft tissues at L5 with moderate to severe canal stenosis. There is L3 compression fracture      05/16/2016 Bone Marrow Biopsy    Accession: [id number] bone marrow biopsy confirmed 60% plasma cell involvement      05/18/2016 -  Chemotherapy    He was started on Velcade and dexamethasone      07/03/2016 -  Chemotherapy    Low dose Revlimid is added       INTERVAL HISTORY: Please see below for problem oriented charting. He is seen today with multiple family members. He had recent ERCP and he was uneventful. He bruises easily. The patient denies any recent signs or symptoms of bleeding such as spontaneous epistaxis, hematuria or hematochezia. He has recent mild nonproductive cough. His  nephrologist suspect that he may have line infection and will be started on IV antibiotics soon Diarrhea has improved since the recent ERCP Family members felt he is getting weaker. He is participating in physical therapy  REVIEW OF SYSTEMS:   Constitutional: Denies fevers, chills or abnormal weight loss Eyes: Denies blurriness of vision Ears, nose, mouth, throat, and face: Denies mucositis or sore throat Cardiovascular: Denies palpitation, chest discomfort or lower extremity swelling Gastrointestinal:  Denies nausea, heartburn or change in bowel habits Lymphatics: Denies new lymphadenopathy Neurological:Denies numbness, tingling  Behavioral/Psych: Mood is stable, no new changes  All other systems were reviewed with the patient and are negative.  I have reviewed the past medical history, past surgical history, social history and family history with the patient and they are unchanged from previous note.  ALLERGIES:  is allergic to penicillins; dilaudid [hydromorphone hcl]; fish allergy; and codeine.  MEDICATIONS:  Current Outpatient Prescriptions  Medication Sig Dispense Refill  . acetaminophen (TYLENOL) 325 MG tablet Take 2 tablets (650 mg total) by mouth every 6 (six) hours as needed for mild pain (or Fever >/= 101). 15 tablet 0  . acyclovir (ZOVIRAX) 200 MG capsule Take 1 capsule (200 mg total) by mouth 2 (two) times daily. 60 capsule 9  . ALPRAZolam (XANAX) 0.25 MG tablet Take 1 tablet (0.25 mg total) by mouth 2 (two) times daily as needed for anxiety. 10 tablet 0  . calcium acetate (PHOSLO) 667 MG capsule Take 1 capsule (667 mg  total) by mouth 2 (two) times daily with a meal. 60 capsule 1  . Cholecalciferol (VITAMIN D3) 50000 units CAPS Take 1 capsule by mouth once a week.    . cyanocobalamin 1000 MCG tablet Take 1 tablet (1,000 mcg total) by mouth daily. 30 tablet 0  . dexamethasone (DECADRON) 2 MG tablet Take 1 mg by mouth daily.     . diphenoxylate-atropine (LOMOTIL) 2.5-0.025 MG  tablet Take 1 tablet by mouth 4 (four) times daily as needed for diarrhea or loose stools.    . insulin aspart (NOVOLOG) 100 UNIT/ML injection Before each meal 3 times a day, 140-199 - 2 units, 200-250 - 4 units, 251-299 - 6 units,  300-349 - 8 units,  350 or above 10 units. Dispense syringes and needles as needed, Ok to switch to PEN if approved. Substitute to any brand approved. DX DM2, Code E11.65 1 vial 12  . loratadine (CLARITIN) 10 MG tablet Take 10 mg by mouth daily.     . methocarbamol (ROBAXIN) 500 MG tablet Take 500 mg by mouth every 6 (six) hours as needed for muscle spasms. Takes a half tablet as needed    . midodrine (PROAMATINE) 10 MG tablet Take 1 tablet (10 mg total) by mouth 3 (three) times daily with meals. 90 tablet 6  . multivitamin (RENA-VIT) TABS tablet Take 1 tablet by mouth at bedtime. 30 tablet 0  . ondansetron (ZOFRAN) 8 MG tablet Take 1 tablet (8 mg total) by mouth every 8 (eight) hours as needed for nausea. 30 tablet 3  . oxyCODONE (OXY IR/ROXICODONE) 5 MG immediate release tablet Take 1 tablet (5 mg total) by mouth every 4 (four) hours as needed for moderate pain. (Patient taking differently: Take 2.5-10 mg by mouth every 4 (four) hours as needed for moderate pain. ) 10 tablet 0  . pantoprazole (PROTONIX) 40 MG tablet Take 1 tablet (40 mg total) by mouth daily. 30 tablet 1  . polyethylene glycol (MIRALAX / GLYCOLAX) packet Take 17 g by mouth daily as needed for mild constipation or moderate constipation. 14 each 0  . protein supplement (RESOURCE BENEPROTEIN) POWD Take 6 g by mouth 3 (three) times daily with meals.  0  . saccharomyces boulardii (FLORASTOR) 250 MG capsule Take 1 capsule (250 mg total) by mouth 2 (two) times daily. 10 capsule 0  . triamcinolone (NASACORT AQ) 55 MCG/ACT AERO nasal inhaler Place 2 sprays into the nose daily. 1 Inhaler 1   No current facility-administered medications for this visit.     PHYSICAL EXAMINATION: ECOG PERFORMANCE STATUS: 2 -  Symptomatic, <50% confined to bed  Vitals:   09/25/16 1403  BP: (!) 112/47  Pulse: 100  Resp: 18  Temp: 97.9 F (36.6 C)   Filed Weights    GENERAL:alert, no distress and comfortable. He looks thin and cachectic SKIN: Noted extensive skin bruising  EYES: normal, Conjunctiva are pale and non-injected, sclera clear Musculoskeletal:no cyanosis of digits and no clubbing  NEURO: alert & oriented x 3 with fluent speech, no focal motor/sensory deficits  LABORATORY DATA:  I have reviewed the data as listed    Component Value Date/Time   NA 134 (L) 09/18/2016 1247   K 4.0 09/18/2016 1247   CL 106 09/02/2016 0435   CO2 26 09/18/2016 1247   GLUCOSE 130 09/18/2016 1247   BUN 7.6 09/18/2016 1247   CREATININE 1.9 (H) 09/18/2016 1247   CALCIUM 9.8 09/18/2016 1247   PROT 10.5 (H) 09/18/2016 1247   ALBUMIN 2.5 (L)  09/18/2016 1247   AST 18 09/18/2016 1247   ALT 11 09/18/2016 1247   ALKPHOS 108 09/18/2016 1247   BILITOT 0.55 09/18/2016 1247   GFRNONAA 22 (L) 09/02/2016 0435   GFRAA 25 (L) 09/02/2016 0435    No results found for: SPEP, UPEP  Lab Results  Component Value Date   WBC 5.4 09/21/2016   NEUTROABS 4.1 09/21/2016   HGB 8.9 (L) 09/21/2016   HCT 26.7 Repeated and verified X2. (L) 09/21/2016   MCV 86.7 09/21/2016   PLT 64.0 (L) 09/21/2016      Chemistry      Component Value Date/Time   NA 134 (L) 09/18/2016 1247   K 4.0 09/18/2016 1247   CL 106 09/02/2016 0435   CO2 26 09/18/2016 1247   BUN 7.6 09/18/2016 1247   CREATININE 1.9 (H) 09/18/2016 1247   GLU 152 07/13/2016      Component Value Date/Time   CALCIUM 9.8 09/18/2016 1247   ALKPHOS 108 09/18/2016 1247   AST 18 09/18/2016 1247   ALT 11 09/18/2016 1247   BILITOT 0.55 09/18/2016 1247      ASSESSMENT & PLAN:  Multiple myeloma not having achieved remission ([place]) Unfortunately, the patient have evidence of disease progression. I suspect is due to frequent interruption of his treatment given his other major  comorbidities. The patient has significant decline in overall performance status with recurrent infection and currently is oliguric. I reviewed all the blood work and treatment recommendation. I will hold off starting him on treatment because his nephrologist is starting him on IV antibiotic therapy to treat potential line infection. I will tentatively schedule him to start treatment on 10/10/2016. Treatment will be strictly palliative. The decision was made based on publication at the [facility].  Targeting CD38 with Daratumumab Monotherapy in Multiple Myeloma Henk M. Nikki Dom, M.D., Ph.D., Lisabeth Pick, M.D., Edyth Gunnels. Serafina Mitchell, M.D., Carolanne Grumbling, M.D., Ph.D., Lelon Frohlich, M.D., Ph.D., Towanda Malkin, M.D., Ph.D., Monique C. Linus Galas, M.D., Ph.D., Harden Mo, M.D., Ph.D., Rudolpho Sevin, M.D., Lucienne Capers, M.D., Stefano Gaul W.C.J. Mady Haagensen, M.D., Ph.D., Lynetta Mare, M.D., Ph.D., Ella Jubilee, M.D., Ph.D., Darol Destine. Rozann Lesches, Ph.D., Mayer Masker, Ph.D., Levy Pupa, Ph.D., Pearletha Forge, M.[place]., Candie Mile, M.D., Godfrey Pick, M.D., Hassan Rowan, M.D., Ph.D., and Loyal Gambler. Marvel Plan, M.D. Alison Stalling J Med [phone] 24, 2015  The chemotherapy consists of daratumumab, a humanized monoclonal antibody against CD 38.  According to the publication, there were 2 complete response is noted in the group of 32 patients treated.. Infusion reactions are common side effects.  Some of the short term side-effects included, though not limited to, risk of fatigue, weight loss, tumor lysis syndrome, risk of allergic reactions, pancytopenia, life-threatening infections, need for transfusions of blood products, admission to hospital for various reasons, and risks of death.   The patient is aware that the response rates discussed earlier is not guaranteed.    After a long discussion, patient made an informed decision to proceed with the prescribed plan of care.  Estimated response rate with this  regimen is roughly 30%. That could be combined with possible Revlimid in the future if his pancytopenia improves. I will stop daily dexamethasone and switch him to pulse dexamethasone on Sundays, at 20 mg once a week along with acyclovir as antimicrobial prophylaxis. I will not start him on Zometa due to renal failure and inability to obtain dental clearance. I will see him back in 2 weeks for further assessment     Antineoplastic  chemotherapy induced pancytopenia (HCC) The cause of the pancytopenia is multifactorial. He is not symptomatic from thrombocytopenia and would not need transfusion This is likely anemia of chronic disease. The patient denies recent history of bleeding such as epistaxis, hematuria or hematochezia. He is asymptomatic from the anemia. We will observe for now.  He does not require transfusion now. He is receiving ESA through dialysis center   Acalculous cholecystitis He has recent ERCP. Hopefully, the biliary drainage tube can be removed soon  Closed fracture of shaft of left femur ([place]) Continue high-dose vitamin D. He will continue physical therapy. Per patient request, I wrote him a prescription for a left foot splint/brace for foot drop  ESRD (end stage renal disease) on dialysis [facility]) He is receiving hemodialysis in the outpatient on Mondays, Wednesdays and Fridays I will schedule his chemotherapy to start on Tuesdays, weekly 8 starting 10/10/2016  Vitamin B12 deficiency He will continue vitamin B-12 supplement. I plan to recheck his serum vitamin B-12 level in his next visit  Diarrhea Diarrhea has improved since recent ERCP. Continue close observation. He has Imodium to take as needed  Protein-calorie malnutrition, moderate ([place]) I continue to encourage him to increase oral intake as tolerated Due to poorly controlled hyperglycemia postprandially, I plan to reduce stop dexamethasone altogether until we restart back on treatment as  scheduled  Intravenous line infection ([place]) According to the family, his nephrologist will be starting him on broad-spectrum IV antibiotics to protect his dialysis catheter which they suspect is infected. For this reason, I will not start his treatment until 10/10/2016  Goals of care, counseling/discussion I have a long discussion with the patient and family members. We discussed goals of care. His overall prognosis is poor due to multiple comorbidities and anticipated long-term hemodialysis. The patient is prone to recurrent infection and severe pancytopenia. We discussed advanced directives. He appointed his wife as his MPOA. We discussed CODE STATUS. He desires full code. We discussed potential placement of feeding tube in the future if necessary and the patient is not opposed to it.    Orders Placed This Encounter  Procedures  . CBC with Differential    Standing Status:   Standing    Number of Occurrences:   20    Standing Expiration Date:   10/30/2017  . Comprehensive metabolic panel    Standing Status:   Standing    Number of Occurrences:   20    Standing Expiration Date:   10/30/2017  . Vitamin D 25 hydroxy    Standing Status:   Future    Standing Expiration Date:   10/30/2017  . Vitamin B12    Standing Status:   Future    Standing Expiration Date:   10/30/2017  . Type and screen         Standing Status:   Future    Standing Expiration Date:   10/30/2017   All questions were answered. The patient knows to call the clinic with any problems, questions or concerns. No barriers to learning was detected. I spent 55 minutes counseling the patient face to face. The total time spent in the appointment was 60 minutes and more than 50% was on counseling and review of test results     Heath Lark, MD 09/25/2016 5:49 PM

## 2016-09-25 NOTE — Assessment & Plan Note (Signed)
He has recent ERCP. Hopefully, the biliary drainage tube can be removed soon

## 2016-09-25 NOTE — Assessment & Plan Note (Signed)
I continue to encourage him to increase oral intake as tolerated Due to poorly controlled hyperglycemia postprandially, I plan to reduce stop dexamethasone altogether until we restart back on treatment as scheduled

## 2016-09-25 NOTE — Assessment & Plan Note (Signed)
He will continue vitamin B-12 supplement. I plan to recheck his serum vitamin B-12 level in his next visit

## 2016-09-25 NOTE — Assessment & Plan Note (Signed)
Diarrhea has improved since recent ERCP. Continue close observation. He has Imodium to take as needed

## 2016-09-25 NOTE — Progress Notes (Signed)
START ON PATHWAY REGIMEN - Multiple Myeloma  [id number]: Daratumumab 16 mg/kg q28 Days Until Progression or Unacceptable Toxicity   A cycle is every 28 days:     Daratumumab (Darzalex(R)) 16 mg/kg Weekly x8 wks; q2 wks x16 wks; then q4 wks. Infuse IV, use low protein-binding filter, may escalate rate if tolerated. See linked supplementary infusion document for infusion details. Dose Mod: None Additional Orders: Cycle=q28 days. Administer methylprednisolone 20 mg (or equivalent) PO on 1st and 2nd days after each infusion to prevent delayed hypersensitivity rxn. Initiate antiviral ppx w/in 1 wk of starting & for 3 months following tx. Patients  with hx of COPD may need additional post infusion meds, see supplementary infusion document. Rec monitoring: infusion rxn, CBC w/diff, CMP, BP. May interfere w/ cross-matching & RBC antibody screening. See PI for more details. Refs: Darzalex  (daratumumab) PI; [place] 2015.  **Always confirm dose/schedule in your pharmacy ordering system**    Patient Characteristics: Relapsed / Refractory, All Lines of Therapy R-ISS Staging: III Disease Classification: Refractory Line of Therapy: Second Line  Intent of Therapy: Non-Curative / Palliative Intent, Discussed with Patient

## 2016-09-25 NOTE — Assessment & Plan Note (Signed)
The cause of the pancytopenia is multifactorial. He is not symptomatic from thrombocytopenia and would not need transfusion This is likely anemia of chronic disease. The patient denies recent history of bleeding such as epistaxis, hematuria or hematochezia. He is asymptomatic from the anemia. We will observe for now.  He does not require transfusion now. He is receiving ESA through dialysis center

## 2016-09-25 NOTE — Assessment & Plan Note (Signed)
Continue high-dose vitamin D. He will continue physical therapy. Per patient request, I wrote him a prescription for a left foot splint/brace for foot drop

## 2016-09-25 NOTE — Telephone Encounter (Signed)
Message sent to chemo scheduler to add chemo. Labs scheduled weekly, per 09/25/16 los. Follow up appointment scheduled, per 09/25/16 los. AVS report and appointment schedule given per 09/25/16 los.

## 2016-09-25 NOTE — Assessment & Plan Note (Signed)
I have a long discussion with the patient and family members. We discussed goals of care. His overall prognosis is poor due to multiple comorbidities and anticipated long-term hemodialysis. The patient is prone to recurrent infection and severe pancytopenia. We discussed advanced directives. He appointed his wife as his MPOA. We discussed CODE STATUS. He desires full code. We discussed potential placement of feeding tube in the future if necessary and the patient is not opposed to it.

## 2016-09-25 NOTE — Assessment & Plan Note (Signed)
He is receiving hemodialysis in the outpatient on Mondays, Wednesdays and Fridays I will schedule his chemotherapy to start on Tuesdays, weekly 8 starting 10/10/2016

## 2016-09-25 NOTE — Assessment & Plan Note (Signed)
According to the family, his nephrologist will be starting him on broad-spectrum IV antibiotics to protect his dialysis catheter which they suspect is infected. For this reason, I will not start his treatment until 10/10/2016

## 2016-09-26 ENCOUNTER — Other Ambulatory Visit: Payer: Self-pay | Admitting: General Surgery

## 2016-09-26 ENCOUNTER — Telehealth: Payer: Self-pay | Admitting: *Deleted

## 2016-09-26 DIAGNOSIS — K8000 Calculus of gallbladder with acute cholecystitis without obstruction: Secondary | ICD-10-CM

## 2016-09-26 NOTE — Telephone Encounter (Signed)
Per LOS I have scheduled appts and notified the scheduler 

## 2016-09-28 ENCOUNTER — Other Ambulatory Visit: Payer: Self-pay | Admitting: *Deleted

## 2016-09-28 ENCOUNTER — Other Ambulatory Visit: Payer: Self-pay

## 2016-09-28 NOTE — Patient Outreach (Signed)
Final Transition of care call: Placed call to patient and spoke with wife.  Wife reports that patient had ERCP and gall stone is gone. Continues to bilary drain. Reports that he will keep drain until seen again by Dr. Redmond Pulling on 10/18/2016.   Wife reports "bad news' from cancer doctor that multiple myeloma is worsening. Wife reports that patient will start chemo again on 10/10/2016.  Wife reports that patient continues to go to dialysis 3 times per week. States CBG up and down and appetite is also up and down. Wife reports patient continues to have physical therapy.   Reviewed with wife that patient has completed transition of care program successfully.  Reviewed needs and wife reports that the [facility] is managing all needs at this time and they feel comfortable to this plan. Denies any nursing needs at this time. Reviewed with wife the importance of completing advanced directives.  Reviewed that [facility] social worker will remain active and will follow up as planned.  PLAN: will close to nursing as patient as completed transition of care program. Wife denies further needs at this time. Will send UPDATE letter to MD. Will notify [facility] social of nursing completed.  [facility] CM Care Plan Problem One   Flowsheet Row Most Recent Value  Care Plan Problem One  Recent admission for abdominal pain.  Role Documenting the Problem One  Care Management [place] for Problem One  Active  [facility] Long Term Goal (31-90 days)  Patient will report no readmission for the next 31 days.  [facility] Long Term Goal Start Date  08/28/16 Sudie Grumbling restarted due to [place] Term Goal Met Date  09/28/16  Interventions for Problem One Long Term Goal  Reviewed reasons to call MD.  [facility] CM Short Term Goal #1 (0-30 days)  Wife and or patient will report improved healing for skin breakdown to buttocks in the next 2 weeks.  [facility] CM Short Term Goal #1 Start Date  08/28/16  [facility] CM Short Term Goal #1 Met Date  09/05/16   Interventions for Short Term Goal #1  Reviewed importance of pressure relieving techniques. Reviewed importance of good nutrition.   [facility] CM Short Term Goal #2 (0-30 days)  Patient and or wife will report home health nurse arriving for start of care in the next 2 days.  [facility] CM Short Term Goal #2 Start Date  09/05/16 Barrie Folk was met and then readmission. now awaiting home health]  [facility] CM Short Term Goal #2 Met Date  09/28/16  Interventions for Short Term Goal #2  unable to assess.Wife could not talk on phone.     Tomasa Rand, RN, BSN, CEN [facility] [employer] [phone]

## 2016-09-28 NOTE — Patient Outreach (Signed)
[facility]) Care Management  [facility] Social Work  09/28/2016  Thomas [name] 06-Dec-1946 [id number]  Subjective:  "Gallbladder stone is gone but still has the drain".  Encounter Medications:  Outpatient Encounter Prescriptions as of 09/28/2016  Medication Sig Note  . acetaminophen (TYLENOL) 325 MG tablet Take 2 tablets (650 mg total) by mouth every 6 (six) hours as needed for mild pain (or Fever >/= 101).   Marland Kitchen acyclovir (ZOVIRAX) 200 MG capsule Take 1 capsule (200 mg total) by mouth 2 (two) times daily.   Marland Kitchen ALPRAZolam (XANAX) 0.25 MG tablet Take 1 tablet (0.25 mg total) by mouth 2 (two) times daily as needed for anxiety.   . calcium acetate (PHOSLO) 667 MG capsule Take 1 capsule (667 mg total) by mouth 2 (two) times daily with a meal.   . Cholecalciferol (VITAMIN D3) 50000 units CAPS Take 1 capsule by mouth once a week. 09/19/2016: Wednesdays   . cyanocobalamin 1000 MCG tablet Take 1 tablet (1,000 mcg total) by mouth daily.   Marland Kitchen dexamethasone (DECADRON) 2 MG tablet Take 1 mg by mouth daily.    . diphenoxylate-atropine (LOMOTIL) 2.5-0.025 MG tablet Take 1 tablet by mouth 4 (four) times daily as needed for diarrhea or loose stools.   . insulin aspart (NOVOLOG) 100 UNIT/ML injection Before each meal 3 times a day, 140-199 - 2 units, 200-250 - 4 units, 251-299 - 6 units,  300-349 - 8 units,  350 or above 10 units. Dispense syringes and needles as needed, Ok to switch to PEN if approved. Substitute to any brand approved. DX DM2, Code E11.65 09/19/2016: Insulin is only taken as needed  . loratadine (CLARITIN) 10 MG tablet Take 10 mg by mouth daily.    . methocarbamol (ROBAXIN) 500 MG tablet Take 500 mg by mouth every 6 (six) hours as needed for muscle spasms. Takes a half tablet as needed   . midodrine (PROAMATINE) 10 MG tablet Take 1 tablet (10 mg total) by mouth 3 (three) times daily with meals.   . multivitamin (RENA-VIT) TABS tablet Take 1 tablet by mouth at bedtime.   .  ondansetron (ZOFRAN) 8 MG tablet Take 1 tablet (8 mg total) by mouth every 8 (eight) hours as needed for nausea.   Marland Kitchen oxyCODONE (OXY IR/ROXICODONE) 5 MG immediate release tablet Take 1 tablet (5 mg total) by mouth every 4 (four) hours as needed for moderate pain. (Patient taking differently: Take 2.5-10 mg by mouth every 4 (four) hours as needed for moderate pain. )   . pantoprazole (PROTONIX) 40 MG tablet Take 1 tablet (40 mg total) by mouth daily.   . polyethylene glycol (MIRALAX / GLYCOLAX) packet Take 17 g by mouth daily as needed for mild constipation or moderate constipation.   . protein supplement (RESOURCE BENEPROTEIN) POWD Take 6 g by mouth 3 (three) times daily with meals.   . saccharomyces boulardii (FLORASTOR) 250 MG capsule Take 1 capsule (250 mg total) by mouth 2 (two) times daily.   Marland Kitchen triamcinolone (NASACORT AQ) 55 MCG/ACT AERO nasal inhaler Place 2 sprays into the nose daily.    No facility-administered encounter medications on file as of 09/28/2016.     Functional Status:  In your present state of health, do you have any difficulty performing the following activities: 09/05/2016 09/01/2016  Hearing? Y N  Vision? N N  Difficulty concentrating or making decisions? N N  Walking or climbing stairs? N Y  Dressing or bathing? N Y  Doing errands, shopping? [place] Don[place]  Preparing Food and eating ? Y -  Using the Toilet? N -  In the past six months, have you accidently leaked urine? N -  Do you have problems with loss of bowel control? Y -  Managing your Medications? N -  Managing your Finances? N -  Housekeeping or managing your Housekeeping? N -  Some recent data might be hidden    Fall/Depression Screening:  PHQ 2/9 Scores 09/05/2016 08/07/2016 06/29/2016  PHQ - 2 Score 1 3 0  PHQ- 9 Score - 6 -    Assessment:  CSW spoke with patient's wife today by phone. She reports they have not completed the Advance Directives but know what to do. Per wife, his PCP at the [facility] is planning  to give him "an 8 hour shot for his myeloma". She says this will be a weekly treatment.   CSW encouraged her to take the Advance Directive documents with them to the [place] for notarizing.   Wife reports he has cough which they are determining what antibiotic to give him to treat-  "he had an IV dose at dialysis'.      Plan: CSW will plan a follow up call 11/1 for update regarding Advance Direcitves and potential case closure.        Eduard Clos, MSW, [place] Worker  [place] [phone]

## 2016-10-02 ENCOUNTER — Ambulatory Visit: Payer: PPO | Admitting: Hematology and Oncology

## 2016-10-02 ENCOUNTER — Other Ambulatory Visit: Payer: PPO

## 2016-10-02 ENCOUNTER — Ambulatory Visit: Payer: PPO

## 2016-10-02 ENCOUNTER — Telehealth: Payer: Self-pay | Admitting: *Deleted

## 2016-10-02 MED ORDER — GLUCOSE BLOOD VI STRP: 1.0000 | ORAL_STRIP | Freq: Three times a day (TID) | 12 refills | Status: DC

## 2016-10-02 NOTE — Telephone Encounter (Signed)
Wife reports 1.  Dialysis stopped antibiotics because pt's Labs did not show any infection.  2. Pt continues to take Dex 1 mg daily and due to start Pulse dose of Dex 20 mg this Sunday 10/29.  When is pt supposed to stop the 1 mg daily?   Wife says she thinks the Dex has been helping his pain and appetite so I instructed her it is Ok w/ Dr. Alvy Bimler for pt to continue the daily dose until Sunday.  She verbalized understanding.  3.  New Rx for Test strips needs to be sent to [facility] with the frequency.  New Rx sent electronically.  Wife will call us back again if any problems.

## 2016-10-05 ENCOUNTER — Encounter (HOSPITAL_COMMUNITY): Payer: Self-pay | Admitting: Diagnostic Radiology

## 2016-10-05 ENCOUNTER — Ambulatory Visit (HOSPITAL_COMMUNITY)
Admission: RE | Admit: 2016-10-05 | Discharge: 2016-10-05 | Disposition: A | Discharge status: Home / Self Care | Payer: PPO | Source: Ambulatory Visit | Attending: General Surgery | Admitting: General Surgery

## 2016-10-05 ENCOUNTER — Ambulatory Visit: Payer: PPO

## 2016-10-05 ENCOUNTER — Other Ambulatory Visit: Payer: Self-pay | Admitting: *Deleted

## 2016-10-05 ENCOUNTER — Telehealth: Payer: Self-pay | Admitting: *Deleted

## 2016-10-05 DIAGNOSIS — Z4803 Encounter for change or removal of drains: Secondary | ICD-10-CM | POA: Diagnosis not present

## 2016-10-05 DIAGNOSIS — T380X5A Adverse effect of glucocorticoids and synthetic analogues, initial encounter: Principal | ICD-10-CM

## 2016-10-05 DIAGNOSIS — R739 Hyperglycemia, unspecified: Secondary | ICD-10-CM

## 2016-10-05 DIAGNOSIS — K8000 Calculus of gallbladder with acute cholecystitis without obstruction: Secondary | ICD-10-CM

## 2016-10-05 HISTORY — PX: IR GENERIC HISTORICAL: IMG1180011

## 2016-10-05 MED ORDER — LANCETS MISC: 1.0000 | Freq: Three times a day (TID) | 3 refills | Status: DC

## 2016-10-05 MED ORDER — IOPAMIDOL (ISOVUE-300) INJECTION 61%
INTRAVENOUS | Status: AC
  Administered 2016-10-05: 20 mL
  Filled 2016-10-05: qty 50

## 2016-10-05 NOTE — Telephone Encounter (Signed)
Wife says pt c/o decreased appetite since last night. He has barely eaten anything and he is having increase in his lower back pain.  He took one oxycodone earlier this morning but afraid to take more since he hasn't eaten.  He denies nausea or vomiting.  Notified Dr. Alvy Bimler and she instructs pt to take Oxycodone and Robaxin as ordered for back pain.  Unsure why his appetite is low.  Try small frequent meals, snacks.    Call back tomorrow morning if his pain is not improved and to let us know about his appetite.  Wife verbalized understanding.  She says Thomas [name] needs clarification on pt's Lancets to check his blood sugar.  Called [place] and s/w [place].  Sent new order for Lancets and she says she thinks it will work. They will call us if any further needs.

## 2016-10-06 ENCOUNTER — Telehealth: Payer: Self-pay | Admitting: *Deleted

## 2016-10-06 MED ORDER — TRAMADOL HCL 50 MG PO TABS: 50.0000 mg | ORAL_TABLET | Freq: Four times a day (QID) | ORAL | 0 refills | Status: AC | PRN

## 2016-10-06 NOTE — Telephone Encounter (Signed)
Yes call in tramadol

## 2016-10-06 NOTE — Telephone Encounter (Signed)
Wife says pt hallucinating on 1/2 Oxycodone 5 mg tablet.  She asks if there is something else Dr. Alvy Bimler can prescribe for pain?

## 2016-10-06 NOTE — Telephone Encounter (Signed)
Informed wife of Tramadol called into [facility] for pain. This is much less likely to cause any hallucinations.  Also encouraged pt to try Robaxin for back pain as needed if pain unrelieved by Tramadol.  She verbalized understanding.  She says his appetite still poor today but is a little better than yesterday.  He is still taking Dex 1 mg daily and will take Pulse dose of 20 mg on Sunday as directed.

## 2016-10-09 ENCOUNTER — Other Ambulatory Visit: Payer: PPO

## 2016-10-09 ENCOUNTER — Inpatient Hospital Stay (HOSPITAL_COMMUNITY): Payer: PPO

## 2016-10-09 ENCOUNTER — Other Ambulatory Visit: Payer: Self-pay | Admitting: Hematology and Oncology

## 2016-10-09 ENCOUNTER — Ambulatory Visit: Payer: PPO

## 2016-10-09 ENCOUNTER — Encounter (HOSPITAL_COMMUNITY): Payer: Self-pay | Admitting: Emergency Medicine

## 2016-10-09 ENCOUNTER — Inpatient Hospital Stay (HOSPITAL_COMMUNITY)
Admission: EM | Admit: 2016-10-09 | Discharge: 2016-10-13 | DRG: 070 | Disposition: A | Discharge status: Hospice | Payer: PPO | Attending: Internal Medicine | Admitting: Internal Medicine

## 2016-10-09 ENCOUNTER — Emergency Department (HOSPITAL_COMMUNITY): Payer: PPO

## 2016-10-09 DIAGNOSIS — G9341 Metabolic encephalopathy: Principal | ICD-10-CM | POA: Diagnosis present

## 2016-10-09 DIAGNOSIS — E538 Deficiency of other specified B group vitamins: Secondary | ICD-10-CM | POA: Diagnosis present

## 2016-10-09 DIAGNOSIS — E1122 Type 2 diabetes mellitus with diabetic chronic kidney disease: Secondary | ICD-10-CM | POA: Diagnosis present

## 2016-10-09 DIAGNOSIS — D619 Aplastic anemia, unspecified: Secondary | ICD-10-CM | POA: Diagnosis present

## 2016-10-09 DIAGNOSIS — K219 Gastro-esophageal reflux disease without esophagitis: Secondary | ICD-10-CM | POA: Diagnosis present

## 2016-10-09 DIAGNOSIS — D649 Anemia, unspecified: Secondary | ICD-10-CM

## 2016-10-09 DIAGNOSIS — N179 Acute kidney failure, unspecified: Secondary | ICD-10-CM | POA: Diagnosis not present

## 2016-10-09 DIAGNOSIS — R34 Anuria and oliguria: Secondary | ICD-10-CM | POA: Diagnosis present

## 2016-10-09 DIAGNOSIS — J189 Pneumonia, unspecified organism: Secondary | ICD-10-CM | POA: Diagnosis present

## 2016-10-09 DIAGNOSIS — K819 Cholecystitis, unspecified: Secondary | ICD-10-CM | POA: Diagnosis present

## 2016-10-09 DIAGNOSIS — N186 End stage renal disease: Secondary | ICD-10-CM | POA: Diagnosis present

## 2016-10-09 DIAGNOSIS — I120 Hypertensive chronic kidney disease with stage 5 chronic kidney disease or end stage renal disease: Secondary | ICD-10-CM | POA: Diagnosis present

## 2016-10-09 DIAGNOSIS — R5383 Other fatigue: Secondary | ICD-10-CM

## 2016-10-09 DIAGNOSIS — Z992 Dependence on renal dialysis: Secondary | ICD-10-CM | POA: Diagnosis not present

## 2016-10-09 DIAGNOSIS — Z794 Long term (current) use of insulin: Secondary | ICD-10-CM

## 2016-10-09 DIAGNOSIS — Z833 Family history of diabetes mellitus: Secondary | ICD-10-CM

## 2016-10-09 DIAGNOSIS — Z91013 Allergy to seafood: Secondary | ICD-10-CM

## 2016-10-09 DIAGNOSIS — M545 Low back pain, unspecified: Secondary | ICD-10-CM | POA: Diagnosis present

## 2016-10-09 DIAGNOSIS — Z515 Encounter for palliative care: Secondary | ICD-10-CM

## 2016-10-09 DIAGNOSIS — E43 Unspecified severe protein-calorie malnutrition: Secondary | ICD-10-CM | POA: Insufficient documentation

## 2016-10-09 DIAGNOSIS — J9601 Acute respiratory failure with hypoxia: Secondary | ICD-10-CM | POA: Diagnosis present

## 2016-10-09 DIAGNOSIS — D61810 Antineoplastic chemotherapy induced pancytopenia: Secondary | ICD-10-CM | POA: Diagnosis present

## 2016-10-09 DIAGNOSIS — Z885 Allergy status to narcotic agent status: Secondary | ICD-10-CM | POA: Diagnosis not present

## 2016-10-09 DIAGNOSIS — Z7952 Long term (current) use of systemic steroids: Secondary | ICD-10-CM

## 2016-10-09 DIAGNOSIS — C9000 Multiple myeloma not having achieved remission: Secondary | ICD-10-CM | POA: Diagnosis present

## 2016-10-09 DIAGNOSIS — F419 Anxiety disorder, unspecified: Secondary | ICD-10-CM | POA: Diagnosis present

## 2016-10-09 DIAGNOSIS — R0600 Dyspnea, unspecified: Secondary | ICD-10-CM

## 2016-10-09 DIAGNOSIS — D6959 Other secondary thrombocytopenia: Secondary | ICD-10-CM

## 2016-10-09 DIAGNOSIS — Z79899 Other long term (current) drug therapy: Secondary | ICD-10-CM | POA: Diagnosis not present

## 2016-10-09 DIAGNOSIS — M8448XA Pathological fracture, other site, initial encounter for fracture: Secondary | ICD-10-CM | POA: Diagnosis present

## 2016-10-09 DIAGNOSIS — Y95 Nosocomial condition: Secondary | ICD-10-CM | POA: Diagnosis present

## 2016-10-09 DIAGNOSIS — J90 Pleural effusion, not elsewhere classified: Secondary | ICD-10-CM | POA: Diagnosis present

## 2016-10-09 DIAGNOSIS — R0603 Acute respiratory distress: Secondary | ICD-10-CM | POA: Diagnosis not present

## 2016-10-09 DIAGNOSIS — Z87891 Personal history of nicotine dependence: Secondary | ICD-10-CM

## 2016-10-09 DIAGNOSIS — G893 Neoplasm related pain (acute) (chronic): Secondary | ICD-10-CM | POA: Diagnosis present

## 2016-10-09 DIAGNOSIS — R531 Weakness: Secondary | ICD-10-CM

## 2016-10-09 DIAGNOSIS — R4182 Altered mental status, unspecified: Secondary | ICD-10-CM

## 2016-10-09 DIAGNOSIS — R410 Disorientation, unspecified: Secondary | ICD-10-CM | POA: Diagnosis present

## 2016-10-09 DIAGNOSIS — N2581 Secondary hyperparathyroidism of renal origin: Secondary | ICD-10-CM | POA: Diagnosis present

## 2016-10-09 DIAGNOSIS — E8770 Fluid overload, unspecified: Secondary | ICD-10-CM

## 2016-10-09 DIAGNOSIS — R443 Hallucinations, unspecified: Secondary | ICD-10-CM | POA: Diagnosis present

## 2016-10-09 DIAGNOSIS — T380X5A Adverse effect of glucocorticoids and synthetic analogues, initial encounter: Secondary | ICD-10-CM | POA: Diagnosis present

## 2016-10-09 DIAGNOSIS — E8889 Other specified metabolic disorders: Secondary | ICD-10-CM | POA: Diagnosis present

## 2016-10-09 DIAGNOSIS — T451X5A Adverse effect of antineoplastic and immunosuppressive drugs, initial encounter: Secondary | ICD-10-CM | POA: Diagnosis present

## 2016-10-09 DIAGNOSIS — Z880 Allergy status to penicillin: Secondary | ICD-10-CM

## 2016-10-09 DIAGNOSIS — R0602 Shortness of breath: Secondary | ICD-10-CM

## 2016-10-09 DIAGNOSIS — Z66 Do not resuscitate: Secondary | ICD-10-CM | POA: Diagnosis not present

## 2016-10-09 DIAGNOSIS — M84452A Pathological fracture, left femur, initial encounter for fracture: Secondary | ICD-10-CM | POA: Diagnosis present

## 2016-10-09 DIAGNOSIS — Z9689 Presence of other specified functional implants: Secondary | ICD-10-CM

## 2016-10-09 DIAGNOSIS — R400 Somnolence: Secondary | ICD-10-CM | POA: Diagnosis not present

## 2016-10-09 DIAGNOSIS — J9811 Atelectasis: Secondary | ICD-10-CM | POA: Diagnosis present

## 2016-10-09 DIAGNOSIS — Z681 Body mass index (BMI) 19 or less, adult: Secondary | ICD-10-CM | POA: Diagnosis not present

## 2016-10-09 DIAGNOSIS — E559 Vitamin D deficiency, unspecified: Secondary | ICD-10-CM

## 2016-10-09 DIAGNOSIS — M8448XD Pathological fracture, other site, subsequent encounter for fracture with routine healing: Secondary | ICD-10-CM

## 2016-10-09 DIAGNOSIS — L8990 Pressure ulcer of unspecified site, unspecified stage: Secondary | ICD-10-CM | POA: Insufficient documentation

## 2016-10-09 DIAGNOSIS — Z938 Other artificial opening status: Secondary | ICD-10-CM

## 2016-10-09 LAB — PROTIME-INR
INR: 1.55
INR: 1.3
PROTHROMBIN TIME: 16.2 s — AB (ref 11.4–15.2)
Prothrombin Time: 18.7 seconds — ABNORMAL HIGH (ref 11.4–15.2)

## 2016-10-09 LAB — CBC WITH DIFFERENTIAL/PLATELET
Basophils Absolute: 0 10*3/uL (ref 0.0–0.1)
Basophils Relative: 0 %
EOS PCT: 0 %
Eosinophils Absolute: 0 10*3/uL (ref 0.0–0.7)
HCT: 21.6 % — ABNORMAL LOW (ref 39.0–52.0)
Hemoglobin: 6.9 g/dL — CL (ref 13.0–17.0)
LYMPHS ABS: 0.9 10*3/uL (ref 0.7–4.0)
LYMPHS PCT: 7 %
MCH: 28.5 pg (ref 26.0–34.0)
MCHC: 31.9 g/dL (ref 30.0–36.0)
MCV: 89.3 fL (ref 78.0–100.0)
MONO ABS: 0.8 10*3/uL (ref 0.1–1.0)
MONOS PCT: 6 %
Neutro Abs: 11.1 10*3/uL — ABNORMAL HIGH (ref 1.7–7.7)
Neutrophils Relative %: 87 %
PLATELETS: 96 10*3/uL — AB (ref 150–400)
RBC: 2.42 MIL/uL — AB (ref 4.22–5.81)
RDW: 15.4 % (ref 11.5–15.5)
WBC: 12.8 10*3/uL — ABNORMAL HIGH (ref 4.0–10.5)

## 2016-10-09 LAB — CBC
HCT: 21.2 % — ABNORMAL LOW (ref 39.0–52.0)
Hemoglobin: 6.6 g/dL — CL (ref 13.0–17.0)
MCH: 28.1 pg (ref 26.0–34.0)
MCHC: 31.1 g/dL (ref 30.0–36.0)
MCV: 90.2 fL (ref 78.0–100.0)
PLATELETS: 84 10*3/uL — AB (ref 150–400)
RBC: 2.35 MIL/uL — ABNORMAL LOW (ref 4.22–5.81)
RDW: 15.6 % — AB (ref 11.5–15.5)
WBC: 12.6 10*3/uL — AB (ref 4.0–10.5)

## 2016-10-09 LAB — COMPREHENSIVE METABOLIC PANEL
ALBUMIN: 1.7 g/dL — AB (ref 3.5–5.0)
ALT: 11 U/L — AB (ref 17–63)
ALT: 10 U/L — ABNORMAL LOW (ref 17–63)
ANION GAP: 13 (ref 5–15)
AST: 21 U/L (ref 15–41)
AST: 20 U/L (ref 15–41)
Albumin: 1.7 g/dL — ABNORMAL LOW (ref 3.5–5.0)
Alkaline Phosphatase: 83 U/L (ref 38–126)
Alkaline Phosphatase: 82 U/L (ref 38–126)
Anion gap: 10 (ref 5–15)
BUN: 31 mg/dL — AB (ref 6–20)
BUN: 35 mg/dL — AB (ref 6–20)
CHLORIDE: 94 mmol/L — AB (ref 101–111)
CHLORIDE: 96 mmol/L — AB (ref 101–111)
CO2: 24 mmol/L (ref 22–32)
CO2: 20 mmol/L — ABNORMAL LOW (ref 22–32)
CREATININE: 5.25 mg/dL — AB (ref 0.61–1.24)
Calcium: 12.8 mg/dL — ABNORMAL HIGH (ref 8.9–10.3)
Calcium: 12.5 mg/dL — ABNORMAL HIGH (ref 8.9–10.3)
Creatinine, Ser: 5.42 mg/dL — ABNORMAL HIGH (ref 0.61–1.24)
GFR calc Af Amer: 12 mL/min — ABNORMAL LOW (ref >60)
GFR calc Af Amer: 11 mL/min — ABNORMAL LOW (ref >60)
GFR calc non Af Amer: 10 mL/min — ABNORMAL LOW (ref >60)
GFR, EST NON AFRICAN AMERICAN: 10 mL/min — AB (ref >60)
GLUCOSE: 123 mg/dL — AB (ref 65–99)
Glucose, Bld: 139 mg/dL — ABNORMAL HIGH (ref 65–99)
POTASSIUM: 4.9 mmol/L (ref 3.5–5.1)
POTASSIUM: 5.1 mmol/L (ref 3.5–5.1)
Sodium: 128 mmol/L — ABNORMAL LOW (ref 135–145)
Sodium: 129 mmol/L — ABNORMAL LOW (ref 135–145)
Total Bilirubin: 0.5 mg/dL (ref 0.3–1.2)
Total Bilirubin: 0.7 mg/dL (ref 0.3–1.2)
Total Protein: 10.2 g/dL — ABNORMAL HIGH (ref 6.5–8.1)

## 2016-10-09 LAB — HEMOGLOBIN AND HEMATOCRIT, BLOOD
HCT: 27.4 % — ABNORMAL LOW (ref 39.0–52.0)
Hemoglobin: 9 g/dL — ABNORMAL LOW (ref 13.0–17.0)

## 2016-10-09 LAB — I-STAT CG4 LACTIC ACID, ED: LACTIC ACID, VENOUS: 0.74 mmol/L (ref 0.5–1.9)

## 2016-10-09 LAB — I-STAT ARTERIAL BLOOD GAS, ED
Acid-Base Excess: 4 mmol/L — ABNORMAL HIGH (ref 0.0–2.0)
BICARBONATE: 28 mmol/L (ref 20.0–28.0)
O2 SAT: 95 %
PCO2 ART: 35.4 mmHg (ref 32.0–48.0)
PH ART: 7.502 — AB (ref 7.350–7.450)
PO2 ART: 64 mmHg — AB (ref 83.0–108.0)
Patient temperature: 97.3
TCO2: 29 mmol/L (ref 0–100)

## 2016-10-09 LAB — GLUCOSE, CAPILLARY
GLUCOSE-CAPILLARY: 125 mg/dL — AB (ref 65–99)
GLUCOSE-CAPILLARY: 83 mg/dL (ref 65–99)

## 2016-10-09 LAB — CBG MONITORING, ED: Glucose-Capillary: 127 mg/dL — ABNORMAL HIGH (ref 65–99)

## 2016-10-09 LAB — TROPONIN I: Troponin I: 0.03 ng/mL (ref <0.03)

## 2016-10-09 LAB — INFLUENZA PANEL BY PCR (TYPE A & B)
H1N1FLUPCR: NOT DETECTED
INFLBPCR: NEGATIVE
Influenza A By PCR: NEGATIVE

## 2016-10-09 LAB — HEPATITIS B SURFACE ANTIGEN: Hepatitis B Surface Ag: NEGATIVE

## 2016-10-09 LAB — AMMONIA: Ammonia: 24 umol/L (ref 9–35)

## 2016-10-09 LAB — PREPARE RBC (CROSSMATCH)

## 2016-10-09 MED ORDER — VANCOMYCIN HCL IN DEXTROSE 750-5 MG/150ML-% IV SOLN
INTRAVENOUS | Status: AC
  Filled 2016-10-09: qty 150

## 2016-10-09 MED ORDER — LIDOCAINE HCL (PF) 1 % IJ SOLN: 5.0000 mL | INTRAMUSCULAR | Status: DC | PRN

## 2016-10-09 MED ORDER — VANCOMYCIN HCL 10 G IV SOLR
1250.0000 mg | Freq: Once | INTRAVENOUS | Status: DC
  Administered 2016-10-09: 1250 mg via INTRAVENOUS
  Filled 2016-10-09: qty 1250

## 2016-10-09 MED ORDER — SODIUM CHLORIDE 0.9 % IV SOLN
Freq: Once | INTRAVENOUS | Status: AC
  Administered 2016-10-09: 15:00:00 via INTRAVENOUS

## 2016-10-09 MED ORDER — HEPARIN SODIUM (PORCINE) 1000 UNIT/ML DIALYSIS: 20.0000 [IU]/kg | INTRAMUSCULAR | Status: DC | PRN

## 2016-10-09 MED ORDER — HEPARIN SODIUM (PORCINE) 1000 UNIT/ML DIALYSIS: 1000.0000 [IU] | INTRAMUSCULAR | Status: DC | PRN

## 2016-10-09 MED ORDER — ALTEPLASE 2 MG IJ SOLR: 2.0000 mg | Freq: Once | INTRAMUSCULAR | Status: DC | PRN

## 2016-10-09 MED ORDER — CALCIUM ACETATE (PHOS BINDER) 667 MG PO CAPS: 667.0000 mg | ORAL_CAPSULE | Freq: Two times a day (BID) | ORAL | Status: DC

## 2016-10-09 MED ORDER — VANCOMYCIN HCL IN DEXTROSE 1-5 GM/200ML-% IV SOLN
1000.0000 mg | INTRAVENOUS | Status: DC
  Filled 2016-10-09: qty 200

## 2016-10-09 MED ORDER — CALCITONIN (SALMON) 200 UNIT/ML IJ SOLN
240.0000 [IU] | Freq: Two times a day (BID) | INTRAMUSCULAR | Status: AC
  Administered 2016-10-09 – 2016-10-10 (×4): 240 [IU] via SUBCUTANEOUS
  Filled 2016-10-09 (×5): qty 1.2

## 2016-10-09 MED ORDER — ZOLEDRONIC ACID 4 MG/5ML IV CONC: 4.0000 mg | Freq: Once | INTRAVENOUS | Status: DC

## 2016-10-09 MED ORDER — INSULIN ASPART 100 UNIT/ML ~~LOC~~ SOLN
0.0000 [IU] | Freq: Three times a day (TID) | SUBCUTANEOUS | Status: DC
  Administered 2016-10-09 – 2016-10-10 (×2): 3 [IU] via SUBCUTANEOUS

## 2016-10-09 MED ORDER — LORAZEPAM 2 MG/ML IJ SOLN
INTRAMUSCULAR | Status: AC
  Administered 2016-10-09: 1 mg
  Filled 2016-10-09: qty 1

## 2016-10-09 MED ORDER — AZTREONAM 1 G IJ SOLR
500.0000 mg | Freq: Two times a day (BID) | INTRAMUSCULAR | Status: DC
  Filled 2016-10-09: qty 0.5

## 2016-10-09 MED ORDER — SODIUM CHLORIDE 0.9 % IV SOLN: 100.0000 mL | INTRAVENOUS | Status: DC | PRN

## 2016-10-09 MED ORDER — ACYCLOVIR 200 MG PO CAPS
200.0000 mg | ORAL_CAPSULE | Freq: Two times a day (BID) | ORAL | Status: DC
  Administered 2016-10-09 – 2016-10-12 (×6): 200 mg via ORAL
  Filled 2016-10-09 (×7): qty 1

## 2016-10-09 MED ORDER — VANCOMYCIN HCL IN DEXTROSE 750-5 MG/150ML-% IV SOLN
750.0000 mg | INTRAVENOUS | Status: DC
  Administered 2016-10-09: 750 mg via INTRAVENOUS
  Filled 2016-10-09: qty 150

## 2016-10-09 MED ORDER — LIDOCAINE-PRILOCAINE 2.5-2.5 % EX CREA: 1.0000 "application " | TOPICAL_CREAM | CUTANEOUS | Status: DC | PRN

## 2016-10-09 MED ORDER — SEVELAMER CARBONATE 800 MG PO TABS
1600.0000 mg | ORAL_TABLET | Freq: Three times a day (TID) | ORAL | Status: DC
  Administered 2016-10-10: 1600 mg via ORAL
  Filled 2016-10-09: qty 2

## 2016-10-09 MED ORDER — DEXTROSE 5 % IV SOLN
2.0000 g | Freq: Once | INTRAVENOUS | Status: AC
  Administered 2016-10-09: 2 g via INTRAVENOUS
  Filled 2016-10-09: qty 2

## 2016-10-09 MED ORDER — PENTAFLUOROPROP-TETRAFLUOROETH EX AERO: 1.0000 "application " | INHALATION_SPRAY | CUTANEOUS | Status: DC | PRN

## 2016-10-09 MED ORDER — SODIUM CHLORIDE 0.9 % IV SOLN: 10.0000 mL/h | Freq: Once | INTRAVENOUS | Status: AC

## 2016-10-09 MED ORDER — HEPARIN SODIUM (PORCINE) 5000 UNIT/ML IJ SOLN
5000.0000 [IU] | Freq: Three times a day (TID) | INTRAMUSCULAR | Status: DC
  Administered 2016-10-09 – 2016-10-12 (×7): 5000 [IU] via SUBCUTANEOUS
  Filled 2016-10-09 (×7): qty 1

## 2016-10-09 MED ORDER — SODIUM CHLORIDE 0.9 % IV SOLN: INTRAVENOUS | Status: DC

## 2016-10-09 MED ORDER — DEXAMETHASONE 4 MG PO TABS
4.0000 mg | ORAL_TABLET | Freq: Every day | ORAL | Status: DC
  Administered 2016-10-09 – 2016-10-12 (×4): 4 mg via ORAL
  Filled 2016-10-09 (×4): qty 1

## 2016-10-09 NOTE — ED Notes (Signed)
Pt taken to xray 

## 2016-10-09 NOTE — ED Provider Notes (Signed)
[facility] DEPT Provider Note   CSN: [id number] Arrival date & time: 10/09/16  6378     History   Chief Complaint Chief Complaint  Patient presents with  . Weakness  . Cough    HPI Thomas [name] is a 70 y.o. male.  HPI  70 year old male with a history of multiple myeloma, chronic kidney disease currently on dialysis, and recent acalculous cholecystitis presents with weakness, confusion, and cough. Family provides the history as the patient is confused. Patient has been having a cough ever since he was admitted 2 months ago. Seems to be a little bit worse. Nothing comes up. No fevers. Has complained of some chest pain but was told that it was from the recent gallbladder drain. The drain is still in but capped. Patient has been complaining of left knee pain that is in the area of the rod placed in his femur. This is been over a couple weeks. He has been on oxycodone and was switched to tramadol due to a little bit of confusion over the last several days. Family states it's like he is "out of it". However he took a tramadol at 5:30 PM last night and then slept all night. He was too weak and confused to go to dialysis this morning at 6 AM (today is Monday). His dialysis on Friday was stopped short.  Past Medical History:  Diagnosis Date  . Anemia    ANEMIA OF RENAL DISEASE  . Arthritis   . Chronic kidney disease   . Compression fracture   . Dialysis patient ([place])   . Elevated LFTs 07/2016  . Gall stones   . GERD (gastroesophageal reflux disease)   . Hypotension   . Multiple myeloma ([place])   . Pneumonia   . Thrombocytopenia ([place]) 07/2016    Patient Active Problem List   Diagnosis Date Noted  . Intravenous line infection ([place]) 09/25/2016  . Goals of care, counseling/discussion 09/25/2016  . Full code status 09/25/2016  . Bile duct stone   . Cancer associated pain 09/07/2016  . HCAP (healthcare-associated pneumonia) 09/01/2016  . Klebsiella pneumoniae sepsis ([place])  08/05/2016  . Transaminitis 08/05/2016  . Hyponatremia 08/05/2016  . GERD (gastroesophageal reflux disease) 08/05/2016  . Vitamin D deficiency 08/05/2016  . Pain   . Labile blood pressure   . Labile blood glucose   . Steroid-induced hyperglycemia   . Acalculous cholecystitis   . Elevated LFTs   . Acute respiratory failure with hypoxia ([place])   . Abdominal pain 07/13/2016  . N&V (nausea and vomiting) 07/13/2016  . Sepsis ([place]) 07/13/2016  . Pleural effusion   . RUQ pain   . Anemia of renal disease 07/04/2016  . Diarrhea 07/04/2016  . Sinusitis nasal 07/03/2016  . Conjunctivitis of both eyes 07/03/2016  . ESRD (end stage renal disease) on dialysis ([place]) 06/12/2016  . Protein-calorie malnutrition, moderate ([place]) 06/12/2016  . Constipation   . 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 Physical deconditioning   . Closed fracture of shaft of left femur ([place])   . Adjustment disorder with mixed anxiety and depressed mood   . Postoperative pain   . Bilateral low back pain without sciatica   . Pressure ulcer   . Thrombocytopenia ([place])   . Arterial hypotension   . Functional diarrhea   . Anemia of chronic disease   . Multiple myeloma not having achieved remission ([place])   . Antineoplastic chemotherapy induced pancytopenia ([place])   . Low back pain   . Post-op pain   .  Surgery, elective   . Acute blood loss anemia   . ESRD on dialysis ([place])   . Essential hypertension   . Urinary tract infection, site not specified   . Vitamin B12 deficiency   . Multiple myeloma ([place]) 05/14/2016  . Acute renal failure ([place])   . Pathological fracture of left femur due to neoplastic disease ([place])   . Anemia due to bone marrow failure ([place])   . 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 Occult blood positive stool 05/10/2016  . Malnutrition of moderate degree 05/10/2016    Past Surgical History:  Procedure Laterality Date  . AV FISTULA PLACEMENT Left 05/24/2016   Procedure: ARTERIOVENOUS (AV) FISTULA  CREATION-LEFT;  Surgeon: Conrad Meire Grove, MD;  Location: [facility];  Service: Vascular;  Laterality: Left;  . BONE BIOPSY Left 05/14/2016   Procedure: LEFT FEMORAL BIOPSY WITH INTRAOPERATIVE FROZEN SECTIONS ;  Surgeon: Rod Can, MD;  Location: [facility];  Service: Orthopedics;  Laterality: Left;  . ERCP N/A 09/22/2016   Procedure: ENDOSCOPIC RETROGRADE CHOLANGIOPANCREATOGRAPHY (ERCP);  Surgeon: Gatha Mayer, MD;  Location: [facility] ENDOSCOPY;  Service: Endoscopy;  Laterality: N/A;  . FEMUR IM NAIL Left 05/14/2016   Procedure: INTRAMEDULLARY (IM) NAIL FEMORAL;  Surgeon: Rod Can, MD;  Location: [facility];  Service: Orthopedics;  Laterality: Left;  . IR GENERIC HISTORICAL  07/17/2016   IR PERC CHOLECYSTOSTOMY 07/17/2016 Arne Cleveland, MD [facility]INTER[facility]  . IR GENERIC HISTORICAL  08/30/2016   IR SINUS/FIST TUBE CHK-NON GI 08/30/2016 Greggory Keen, MD [facility]INTER[facility]  . IR GENERIC HISTORICAL  09/15/2016   IR CHOLANGIOGRAM EXISTING TUBE 09/15/2016 Jacqulynn Cadet, MD [facility]  . IR GENERIC HISTORICAL  10/05/2016   IR SINUS/FIST TUBE CHK-NON GI 10/05/2016 Markus Daft, MD [facility]  . MOUTH SURGERY    . RENAL BIOPSY    . SP CHOLECYSTOMY  07/2016   cholecystostomy drain placed under [place] and fluoro       Home Medications    Prior to Admission medications   Medication Sig Start Date End Date Taking? Authorizing Provider  acetaminophen (TYLENOL) 325 MG tablet Take 2 tablets (650 mg total) by mouth every 6 (six) hours as needed for mild pain (or Fever >/= 101). 05/29/16   Nishant Dhungel, MD  acyclovir (ZOVIRAX) 200 MG capsule Take 1 capsule (200 mg total) by mouth 2 (two) times daily. 07/03/16   Heath Lark, MD  ALPRAZolam (XANAX) 0.25 MG tablet Take 1 tablet (0.25 mg total) by mouth 2 (two) times daily as needed for anxiety. 08/02/16   Thurnell Lose, MD  calcium acetate (PHOSLO) 667 MG capsule Take 1 capsule (667 mg total) by mouth 2 (two) times daily with a meal. 06/08/16   Lavon Paganini Angiulli, PA-C    Cholecalciferol (VITAMIN D3) 50000 units CAPS Take 1 capsule by mouth once a week.    Historical Provider, MD  cyanocobalamin 1000 MCG tablet Take 1 tablet (1,000 mcg total) by mouth daily. 06/08/16   Lavon Paganini Angiulli, PA-C  dexamethasone (DECADRON) 2 MG tablet Take 1 mg by mouth daily.     Historical Provider, MD  diphenoxylate-atropine (LOMOTIL) 2.5-0.025 MG tablet Take 1 tablet by mouth 4 (four) times daily as needed for diarrhea or loose stools.    Historical Provider, MD  glucose blood test strip 1 each by Other route 3 (three) times daily. Use as instructed 10/02/16   Heath Lark, MD  insulin aspart (NOVOLOG) 100 UNIT/ML injection Before each meal 3 times a day, 140-199 -  2 units, 200-250 - 4 units, 251-299 - 6 units,  300-349 - 8 units,  350 or above 10 units. Dispense syringes and needles as needed, Ok to switch to PEN if approved. Substitute to any brand approved. DX DM2, Code E11.65 08/02/16   Thurnell Lose, MD  Lancets MISC 1 each by Does not apply route 3 (three) times daily. 10/05/16   Heath Lark, MD  loratadine (CLARITIN) 10 MG tablet Take 10 mg by mouth daily.     Historical Provider, MD  methocarbamol (ROBAXIN) 500 MG tablet Take 500 mg by mouth every 6 (six) hours as needed for muscle spasms. Takes a half tablet as needed    Historical Provider, MD  midodrine (PROAMATINE) 10 MG tablet Take 1 tablet (10 mg total) by mouth 3 (three) times daily with meals. 09/25/16   Heath Lark, MD  multivitamin (RENA-VIT) TABS tablet Take 1 tablet by mouth at bedtime. 06/08/16   Lavon Paganini Angiulli, PA-C  ondansetron (ZOFRAN) 8 MG tablet Take 1 tablet (8 mg total) by mouth every 8 (eight) hours as needed for nausea. 07/03/16   Heath Lark, MD  oxyCODONE (OXY IR/ROXICODONE) 5 MG immediate release tablet Take 1 tablet (5 mg total) by mouth every 4 (four) hours as needed for moderate pain. Patient taking differently: Take 2.5-10 mg by mouth every 4 (four) hours as needed for moderate pain.  08/02/16   Thurnell Lose, MD  pantoprazole (PROTONIX) 40 MG tablet Take 1 tablet (40 mg total) by mouth daily. 06/08/16   Lavon Paganini Angiulli, PA-C  polyethylene glycol (MIRALAX / GLYCOLAX) packet Take 17 g by mouth daily as needed for mild constipation or moderate constipation. 08/02/16   Thurnell Lose, MD  protein supplement (RESOURCE BENEPROTEIN) POWD Take 6 g by mouth 3 (three) times daily with meals. 08/02/16   Thurnell Lose, MD  saccharomyces boulardii (FLORASTOR) 250 MG capsule Take 1 capsule (250 mg total) by mouth 2 (two) times daily. 09/03/16   Costin Karlyne Greenspan, MD  traMADol (ULTRAM) 50 MG tablet Take 1 tablet (50 mg total) by mouth every 6 (six) hours as needed. 10/06/16   Heath Lark, MD  triamcinolone (NASACORT AQ) 55 MCG/ACT AERO nasal inhaler Place 2 sprays into the nose daily. 07/03/16   Heath Lark, MD    Family History Family History  Problem Relation Age of Onset  . Diabetes Father     Social History Social History  Substance Use Topics  . Smoking status: Former Research scientist (life sciences)  . Smokeless tobacco: Never Used  . Alcohol use No     Allergies   Penicillins; Dilaudid [hydromorphone hcl]; Fish allergy; and Codeine   Review of Systems Review of Systems  Constitutional: Negative for fever.  Respiratory: Positive for cough.   Cardiovascular: Positive for chest pain.  Gastrointestinal: Positive for abdominal pain.  Genitourinary: Negative for dysuria.  Musculoskeletal: Positive for arthralgias (left knee).  Neurological: Positive for weakness.  Psychiatric/Behavioral: Positive for confusion.  All other systems reviewed and are negative.    Physical Exam Updated Vital Signs BP 154/75   Pulse 86   Resp 18   Ht 6' 3" (1.905 m)   Wt 135 lb (61.2 kg)   SpO2 98%   BMI 16.87 kg/m   Physical Exam  Constitutional: He appears well-developed and well-nourished. No distress.  Weak cough, sounds wet but nothing comes up  HENT:  Head: Normocephalic and atraumatic.  Right Ear: External ear  normal.  Left Ear: External ear normal.  Nose: Nose normal.  Eyes: Right eye exhibits no discharge. Left eye exhibits no discharge.  Neck: Neck supple.  Cardiovascular: Normal rate, regular rhythm and normal heart sounds.   Pulmonary/Chest: Effort normal. He has rhonchi in the right lower field and the left lower field.  Abdominal: Soft. There is no tenderness.  Right sided dressing covering his biliary drain  Musculoskeletal: He exhibits no edema.       Right knee: He exhibits normal range of motion. No tenderness found.       Left knee: He exhibits normal range of motion (but pain with ROM). No tenderness found.  Neurological: He is alert. He is disoriented.  Alert but confused. Appears sleepy but is awake. Equal strength in all 4 extremities.  Skin: Skin is warm and dry. He is not diaphoretic.  Nursing note and vitals reviewed.    ED Treatments / Results  Labs (all labs ordered are listed, but only abnormal results are displayed) Labs Reviewed  COMPREHENSIVE METABOLIC PANEL - Abnormal; Notable for the following:       Result Value   Sodium 128 (*)    Chloride 94 (*)    Glucose, Bld 123 (*)    BUN 31 (*)    Creatinine, Ser 5.25 (*)    Calcium 12.8 (*)    Albumin 1.7 (*)    ALT 11 (*)    GFR calc non Af Amer 10 (*)    GFR calc Af Amer 12 (*)    All other components within normal limits  CBC WITH DIFFERENTIAL/PLATELET - Abnormal; Notable for the following:    WBC 12.8 (*)    RBC 2.42 (*)    Hemoglobin 6.9 (*)    HCT 21.6 (*)    Platelets 96 (*)    Neutro Abs 11.1 (*)    All other components within normal limits  PROTIME-INR - Abnormal; Notable for the following:    Prothrombin Time 18.7 (*)    All other components within normal limits  TROPONIN I - Abnormal; Notable for the following:    Troponin I 0.03 (*)    All other components within normal limits  CBC - Abnormal; Notable for the following:    WBC 12.6 (*)    RBC 2.35 (*)    Hemoglobin 6.6 (*)    HCT 21.2 (*)     RDW 15.6 (*)    Platelets 84 (*)    All other components within normal limits  COMPREHENSIVE METABOLIC PANEL - Abnormal; Notable for the following:    Sodium 129 (*)    Chloride 96 (*)    CO2 20 (*)    Glucose, Bld 139 (*)    BUN 35 (*)    Creatinine, Ser 5.42 (*)    Calcium 12.5 (*)    Total Protein 10.2 (*)    Albumin 1.7 (*)    ALT 10 (*)    GFR calc non Af Amer 10 (*)    GFR calc Af Amer 11 (*)    All other components within normal limits  PROTIME-INR - Abnormal; Notable for the following:    Prothrombin Time 16.2 (*)    All other components within normal limits  I-STAT ARTERIAL BLOOD GAS, ED - Abnormal; Notable for the following:    pH, Arterial 7.502 (*)    pO2, Arterial 64.0 (*)    Acid-Base Excess 4.0 (*)    All other components within normal limits  CBG MONITORING, ED - Abnormal; Notable for the following:  Glucose-Capillary 127 (*)    All other components within normal limits  CULTURE, BLOOD (ROUTINE X 2)  CULTURE, BLOOD (ROUTINE X 2)  CULTURE, EXPECTORATED SPUTUM-ASSESSMENT  GRAM STAIN  AMMONIA  INFLUENZA PANEL BY PCR (TYPE A & B, H1N1)  URINALYSIS, ROUTINE W REFLEX MICROSCOPIC (NOT AT [facility])  HEPATITIS B SURFACE ANTIGEN  I-STAT CG4 LACTIC ACID, ED  TYPE AND SCREEN  PREPARE RBC (CROSSMATCH)  PREPARE RBC (CROSSMATCH)    EKG  EKG Interpretation  Date/Time:  Monday October 09 2016 06:52:31 EDT Ventricular Rate:  89 PR Interval:    QRS Duration: 103 QT Interval:  351 QTC Calculation: 427 R Axis:   54 Text Interpretation:  Sinus rhythm Probable left ventricular hypertrophy Confirmed by Dina Rich  MD, COURTNEY ([id number]) on 10/09/2016 6:57:48 AM       Radiology Dg Chest 2 View  Result Date: 10/09/2016 CLINICAL DATA:  Chest pain EXAM: CHEST  2 VIEW COMPARISON:  Chest x-ray of August 31, 2016 FINDINGS: The lungs are less well inflated today. There is increased density in the left lower lobe more conspicuous than on the previous study. The interstitial  markings elsewhere in both lungs are mildly increased as well. The cardiac silhouette is enlarged. The pulmonary vascularity is prominent centrally but stable. The dual-lumen dialysis catheter tip projects over the distal third of the SVC. A drainage catheter projects in the right upper quadrant. There is calcification in the wall of the aortic arch. The bony thorax exhibits no acute abnormality. IMPRESSION: Increasing density in the left lower lobe consistent with progressive atelectasis or pneumonia. Increased interstitial prominence bilaterally consistent with low-grade interstitial edema of cardiac or noncardiac cause. Chest CT scanning is recommended to exclude occult malignancy with central obstructing lesion. Electronically Signed   By: David  Martinique M.D.   On: 10/09/2016 08:00   Ct Head Wo Contrast  Result Date: 10/09/2016 CLINICAL DATA:  Generalized weakness. Drowsiness over the last 4 days. Altered mental status. Dialysis patient. Multiple myeloma. EXAM: CT HEAD WITHOUT CONTRAST TECHNIQUE: Contiguous axial images were obtained from the base of the skull through the vertex without intravenous contrast. COMPARISON:  None. FINDINGS: Brain: The brain shows generalized atrophy. No evidence of old or acute focal infarction, mass lesion, hemorrhage, hydrocephalus or extra-axial collection. Vascular: There is atherosclerotic calcification of the major vessels at the base of the brain. Skull: Multiple lytic foci within the calvarium consistent with the clinical history of multiple myeloma. No dominant lesion or lesion associated with extraosseous mass. Sinuses/Orbits: Sinuses clear except for minimal mucosal thickening of the right maxillary sinus. Orbits appear unremarkable. Other: None significant IMPRESSION: Generalized atrophy.  No acute or focal intracranial finding. Multiple calvarial lucencies consistent with the clinical diagnosis of multiple myeloma. Electronically Signed   By: Nelson Chimes M.D.   On:  10/09/2016 08:27   Ct Chest Wo Contrast  Result Date: 10/09/2016 CLINICAL DATA:  Generalized weakness with cough and lethargy for 4 days. Multiple myeloma. EXAM: CT CHEST WITHOUT CONTRAST TECHNIQUE: Multidetector CT imaging of the chest was performed following the standard protocol without IV contrast. COMPARISON:  Chest radiographs 10/09/2016, thoracic spine radiographs 08/30/2016 and thoracic MRI 05/15/2016. FINDINGS: Cardiovascular: Right IJ dialysis catheters extend to the SVC right atrial junction. There is mild atherosclerosis of the aorta, great vessels and coronary arteries. No acute vascular findings are seen on noncontrast imaging. The heart is mildly enlarged. There is no pericardial effusion. Mediastinum/Nodes: There are no enlarged mediastinal, hilar or axillary lymph nodes.Hilar assessment is limited by the  lack of intravenous contrast. The thyroid gland, trachea and esophagus demonstrate no significant findings. Lungs/Pleura: There are small left-greater-than-right pleural effusions. Both lungs demonstrate mild dependent atelectasis and central airway thickening. There is no consolidation or suspicious pulmonary nodule. Upper abdomen:  The visualized upper abdomen appears unremarkable. Musculoskeletal/Chest wall: Numerous lytic lesions are again noted consistent with multiple myeloma. There are progressive mild pathologic fractures at T2 and T6. There is a probable pathologic fracture of the upper sternal body, best seen on the sagittal images. There is a large, lytic expansile lesion within the left eighth rib posteriorly. IMPRESSION: 1. Small pleural effusions with mild bibasilar atelectasis. 2. No adenopathy. 3. Diffuse changes of multiple myeloma with new pathologic fractures at T2 and T6. Electronically Signed   By: Richardean Sale M.D.   On: 10/09/2016 10:11   Dg Knee Complete 4 Views Left  Result Date: 10/09/2016 CLINICAL DATA:  Left knee pain and swelling without history of injury  EXAM: LEFT KNEE - COMPLETE 4+ VIEW COMPARISON:  Portable postoperative images from ORIF of the left femoral shaft dated May 14, 2016 FINDINGS: The bones are subjectively osteopenic. There is a small suprapatellar effusion. There is minimal narrowing of the medial joint compartment. The tibial plateaus appear intact. There is irregularity of the articular surface of the lateral femoral condyle. The visualized portions of the intra medullary rod and anchoring cortical screw appear normal. IMPRESSION: Small suprapatellar effusion. Moderate degenerative change of the articular surface of the lateral femoral condyles. Mild degenerative narrowing of the medial joint compartment. No acute fracture or dislocation. Electronically Signed   By: David  Martinique M.D.   On: 10/09/2016 08:02    Procedures Procedures (including critical care time)  Medications Ordered in ED Medications - No data to display   Initial Impression / Assessment and Plan / ED Course  I have reviewed the triage vital signs and the nursing notes.  Pertinent labs & imaging results that were available during my care of the patient were reviewed by me and considered in my medical decision making (see chart for details).  Clinical Course  Comment By Time  Will do delirium/AMS workup. No focal findings but is confused with wet cough.  Sherwood Gambler, MD 10/30 [phone]  IM teaching service to admit. Request unit of blood, inpatient admission Sherwood Gambler, MD 10/30 [phone]    Will transfuse one unit, cover for PNA given wet sounding cough and CXR findings. Stable for floor admission  Final Clinical Impressions(s) / ED Diagnoses   Final diagnoses:  Symptomatic anemia  Healthcare-associated pneumonia    New Prescriptions New Prescriptions   No medications on file     Sherwood Gambler, MD 10/09/16 1659

## 2016-10-09 NOTE — Consult Note (Addendum)
[facility] Renal Consultation Note    Indication for Consultation:  Management of ESRD/hemodialysis; anemia, hypertension/volume and secondary hyperparathyroidism  HPI: Thomas [name] is a 70 y.o. 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  He was very weak Friday but afebrile at dialysis.  His wife states that since then he has become progressively weaker, coughing more (able to get sputum up Saturday but not now), no N, V, D.  He has chronic leg pain from the knees down.  Review of outpatient dialysis labs shows progressive decline of hgb to 7.2 10/225 and hypercalcemia Corr Ca of 14.4 10/25 with alb 2.3 iPTH 8 P elevated at 8.7  He has been eating small amounts. His wife notes increased confusion.  Work up in the ED shows WBC 12.8 corr Ca of 14.8 , CXR LLL PNA and CT chest with new path fx at T2 and T6, though there was no mention of PNA. He is due for HD today but too weak and lethargic so he was brought to the ED for evaluation with subsequent admission. He has not been using home O2.  Past Medical History:  Diagnosis Date  . Anemia    ANEMIA OF RENAL DISEASE  . Arthritis   . Chronic kidney disease   . Compression fracture   . Dialysis patient ([place])   . Elevated LFTs 07/2016  . Gall stones   . GERD (gastroesophageal reflux disease)   . Hypotension   . Multiple myeloma ([place])   . Pneumonia   . Thrombocytopenia ([place]) 07/2016   Past Surgical History:  Procedure Laterality Date  . AV FISTULA PLACEMENT Left 05/24/2016   Procedure: ARTERIOVENOUS (AV) FISTULA CREATION-LEFT;  Surgeon: Conrad Millbrae, MD;  Location: [facility];  Service: Vascular;  Laterality: Left;  . BONE BIOPSY Left 05/14/2016   Procedure: LEFT FEMORAL BIOPSY WITH INTRAOPERATIVE FROZEN SECTIONS ;  Surgeon: Rod Can, MD;  Location: [place];  Service: Orthopedics;   Laterality: Left;  . ERCP N/A 09/22/2016   Procedure: ENDOSCOPIC RETROGRADE CHOLANGIOPANCREATOGRAPHY (ERCP);  Surgeon: Gatha Mayer, MD;  Location: [facility] ENDOSCOPY;  Service: Endoscopy;  Laterality: N/A;  . FEMUR IM NAIL Left 05/14/2016   Procedure: INTRAMEDULLARY (IM) NAIL FEMORAL;  Surgeon: Rod Can, MD;  Location: [facility];  Service: Orthopedics;  Laterality: Left;  . IR GENERIC HISTORICAL  07/17/2016   IR PERC CHOLECYSTOSTOMY 07/17/2016 Arne Cleveland, MD [facility]INTER[facility] RAD  . IR GENERIC HISTORICAL  08/30/2016   IR SINUS/FIST TUBE CHK-NON GI 08/30/2016 Greggory Keen, MD [facility]INTER[facility] RAD  . IR GENERIC HISTORICAL  09/15/2016   IR CHOLANGIOGRAM EXISTING TUBE 09/15/2016 Jacqulynn Cadet, MD [facility]  . IR GENERIC HISTORICAL  10/05/2016   IR SINUS/FIST TUBE CHK-NON GI 10/05/2016 Markus Daft, MD [facility]  . MOUTH SURGERY    . RENAL BIOPSY    . SP CHOLECYSTOMY  07/2016   cholecystostomy drain placed under [place] and fluoro   Family History  Problem Relation Age of Onset  . Diabetes Father    Social History:  reports that he has quit smoking. He has never used smokeless tobacco. He reports that he does not drink alcohol or use drugs. Allergies  Allergen Reactions  . Penicillins Rash and Other (See Comments)    Has patient had a PCN reaction causing immediate rash, facial/tongue/throat swelling, SOB or lightheadedness with hypotension: YES + Reaction causing SEVERE  RASH involving MUCUS MEMBRANES or SKIN NECROSIS >> YES Reaction that required hospitalization: NO Reaction occurring within the last 10 years: NO If all of the above answers are "NO", then may proceed with Cephalosporin use.   . Dilaudid [Hydromorphone Hcl] Other (See Comments)    hallucinations  . Fish Allergy Nausea And Vomiting  . Codeine Other (See Comments)    "MAKES ME JUMPY"   Prior to Admission medications   Medication Sig Start Date End Date Taking? Authorizing Provider  acetaminophen (TYLENOL) 325 MG tablet Take 2 tablets  (650 mg total) by mouth every 6 (six) hours as needed for mild pain (or Fever >/= 101). 05/29/16  Yes Nishant Dhungel, MD  acyclovir (ZOVIRAX) 200 MG capsule Take 1 capsule (200 mg total) by mouth 2 (two) times daily. 07/03/16  Yes Heath Lark, MD  ALPRAZolam (XANAX) 0.25 MG tablet Take 1 tablet (0.25 mg total) by mouth 2 (two) times daily as needed for anxiety. 08/02/16  Yes Thurnell Lose, MD  calcium acetate (PHOSLO) 667 MG capsule Take 1 capsule (667 mg total) by mouth 2 (two) times daily with a meal. 06/08/16  Yes Lavon Paganini Angiulli, PA-C  Cholecalciferol (VITAMIN D3) 50000 units CAPS Take 1 capsule by mouth once a week.   Yes Historical Provider, MD  cyanocobalamin 1000 MCG tablet Take 1 tablet (1,000 mcg total) by mouth daily. 06/08/16  Yes Daniel J Angiulli, PA-C  dexamethasone (DECADRON) 2 MG tablet Take 4 mg by mouth daily. On Sunday, before first chemo treatment.   Yes Historical Provider, MD  diphenoxylate-atropine (LOMOTIL) 2.5-0.025 MG tablet Take 1 tablet by mouth 4 (four) times daily as needed for diarrhea or loose stools.   Yes Historical Provider, MD  insulin aspart (NOVOLOG) 100 UNIT/ML injection Before each meal 3 times a day, 140-199 - 2 units, 200-250 - 4 units, 251-299 - 6 units,  300-349 - 8 units,  350 or above 10 units. Dispense syringes and needles as needed, Ok to switch to PEN if approved. Substitute to any brand approved. DX DM2, Code E11.65 08/02/16  Yes Thurnell Lose, MD  loratadine (CLARITIN) 10 MG tablet Take 10 mg by mouth daily.    Yes Historical Provider, MD  methocarbamol (ROBAXIN) 500 MG tablet Take 500 mg by mouth every 6 (six) hours as needed for muscle spasms. Takes a half tablet as needed   Yes Historical Provider, MD  midodrine (PROAMATINE) 10 MG tablet Take 1 tablet (10 mg total) by mouth 3 (three) times daily with meals. 09/25/16  Yes Heath Lark, MD  multivitamin (RENA-VIT) TABS tablet Take 1 tablet by mouth at bedtime. 06/08/16  Yes Daniel J Angiulli, PA-C   ondansetron (ZOFRAN) 8 MG tablet Take 1 tablet (8 mg total) by mouth every 8 (eight) hours as needed for nausea. 07/03/16  Yes Heath Lark, MD  oxyCODONE (OXY IR/ROXICODONE) 5 MG immediate release tablet Take 1 tablet (5 mg total) by mouth every 4 (four) hours as needed for moderate pain. Patient taking differently: Take 2.5-10 mg by mouth every 4 (four) hours as needed for moderate pain.  08/02/16  Yes Thurnell Lose, MD  pantoprazole (PROTONIX) 40 MG tablet Take 1 tablet (40 mg total) by mouth daily. 06/08/16  Yes Daniel J Angiulli, PA-C  polyethylene glycol (MIRALAX / GLYCOLAX) packet Take 17 g by mouth daily as needed for mild constipation or moderate constipation. 08/02/16  Yes Thurnell Lose, MD  protein supplement (RESOURCE BENEPROTEIN) POWD Take 6 g by mouth 3 (three)  times daily with meals. 08/02/16  Yes Thurnell Lose, MD  saccharomyces boulardii (FLORASTOR) 250 MG capsule Take 1 capsule (250 mg total) by mouth 2 (two) times daily. 09/03/16  Yes Costin Karlyne Greenspan, MD  traMADol (ULTRAM) 50 MG tablet Take 1 tablet (50 mg total) by mouth every 6 (six) hours as needed. 10/06/16  Yes Heath Lark, MD  glucose blood test strip 1 each by Other route 3 (three) times daily. Use as instructed 10/02/16   Heath Lark, MD  Lancets MISC 1 each by Does not apply route 3 (three) times daily. 10/05/16   Heath Lark, MD  triamcinolone (NASACORT AQ) 55 MCG/ACT AERO nasal inhaler Place 2 sprays into the nose daily. 07/03/16   Heath Lark, MD   Current Facility-Administered Medications  Medication Dose Route Frequency Provider Last Rate Last Dose  . 0.9 %  sodium chloride infusion  10 mL/hr Intravenous Once Sherwood Gambler, MD      . 0.9 %  sodium chloride infusion   Intravenous Continuous Monia Sabal, PA-C      . acyclovir (ZOVIRAX) 200 MG capsule 200 mg  200 mg Oral BID Norman Herrlich, MD      . aztreonam (AZACTAM) 500 mg in dextrose 5 % 50 mL IVPB  500 mg Intravenous Q12H Norman Herrlich, MD      . heparin  injection 5,000 Units  5,000 Units Subcutaneous Q8H Norman Herrlich, MD      . vancomycin (VANCOCIN) 1,250 mg in sodium chloride 0.9 % 250 mL IVPB  1,250 mg Intravenous Once Jake Church Masters, RPH   1,250 mg at 10/09/16 1100   Labs: Basic Metabolic Panel:  Recent Labs Lab 10/09/16 0712  NA 128*  K 4.9  CL 94*  CO2 24  GLUCOSE 123*  BUN 31*  CREATININE 5.25*  CALCIUM 12.8*   Liver Function Tests:  Recent Labs Lab 10/09/16 0712  AST 21  ALT 11*  ALKPHOS 83  BILITOT 0.5  PROT RESULTS UNAVAILABLE DUE TO INTERFERING SUBSTANCE  ALBUMIN 1.7*   No results for input(s): LIPASE, AMYLASE in the last 168 hours.  Recent Labs Lab 10/09/16 0721  AMMONIA 24   CBC:  Recent Labs Lab 10/09/16 0712  WBC 12.8*  NEUTROABS 11.1*  HGB 6.9*  HCT 21.6*  MCV 89.3  PLT 96*   Cardiac Enzymes:  Recent Labs Lab 10/09/16 0712  TROPONINI 0.03*   CBG:  Recent Labs Lab 10/09/16 0850  GLUCAP 127*   Iron Studies: No results for input(s): IRON, TIBC, TRANSFERRIN, FERRITIN in the last 72 hours. Studies/Results: Dg Chest 2 View  Result Date: 10/09/2016 CLINICAL DATA:  Chest pain EXAM: CHEST  2 VIEW COMPARISON:  Chest x-ray of August 31, 2016 FINDINGS: The lungs are less well inflated today. There is increased density in the left lower lobe more conspicuous than on the previous study. The interstitial markings elsewhere in both lungs are mildly increased as well. The cardiac silhouette is enlarged. The pulmonary vascularity is prominent centrally but stable. The dual-lumen dialysis catheter tip projects over the distal third of the SVC. A drainage catheter projects in the right upper quadrant. There is calcification in the wall of the aortic arch. The bony thorax exhibits no acute abnormality. IMPRESSION: Increasing density in the left lower lobe consistent with progressive atelectasis or pneumonia. Increased interstitial prominence bilaterally consistent with low-grade interstitial edema  of cardiac or noncardiac cause. Chest CT scanning is recommended to exclude occult malignancy with central obstructing lesion. Electronically Signed  By: David  Martinique M.D.   On: 10/09/2016 08:00   Ct Head Wo Contrast  Result Date: 10/09/2016 CLINICAL DATA:  Generalized weakness. Drowsiness over the last 4 days. Altered mental status. Dialysis patient. Multiple myeloma. EXAM: CT HEAD WITHOUT CONTRAST TECHNIQUE: Contiguous axial images were obtained from the base of the skull through the vertex without intravenous contrast. COMPARISON:  None. FINDINGS: Brain: The brain shows generalized atrophy. No evidence of old or acute focal infarction, mass lesion, hemorrhage, hydrocephalus or extra-axial collection. Vascular: There is atherosclerotic calcification of the major vessels at the base of the brain. Skull: Multiple lytic foci within the calvarium consistent with the clinical history of multiple myeloma. No dominant lesion or lesion associated with extraosseous mass. Sinuses/Orbits: Sinuses clear except for minimal mucosal thickening of the right maxillary sinus. Orbits appear unremarkable. Other: None significant IMPRESSION: Generalized atrophy.  No acute or focal intracranial finding. Multiple calvarial lucencies consistent with the clinical diagnosis of multiple myeloma. Electronically Signed   By: Nelson Chimes M.D.   On: 10/09/2016 08:27   Ct Chest Wo Contrast  Result Date: 10/09/2016 CLINICAL DATA:  Generalized weakness with cough and lethargy for 4 days. Multiple myeloma. EXAM: CT CHEST WITHOUT CONTRAST TECHNIQUE: Multidetector CT imaging of the chest was performed following the standard protocol without IV contrast. COMPARISON:  Chest radiographs 10/09/2016, thoracic spine radiographs 08/30/2016 and thoracic MRI 05/15/2016. FINDINGS: Cardiovascular: Right IJ dialysis catheters extend to the SVC right atrial junction. There is mild atherosclerosis of the aorta, great vessels and coronary arteries. No  acute vascular findings are seen on noncontrast imaging. The heart is mildly enlarged. There is no pericardial effusion. Mediastinum/Nodes: There are no enlarged mediastinal, hilar or axillary lymph nodes.Hilar assessment is limited by the lack of intravenous contrast. The thyroid gland, trachea and esophagus demonstrate no significant findings. Lungs/Pleura: There are small left-greater-than-right pleural effusions. Both lungs demonstrate mild dependent atelectasis and central airway thickening. There is no consolidation or suspicious pulmonary nodule. Upper abdomen:  The visualized upper abdomen appears unremarkable. Musculoskeletal/Chest wall: Numerous lytic lesions are again noted consistent with multiple myeloma. There are progressive mild pathologic fractures at T2 and T6. There is a probable pathologic fracture of the upper sternal body, best seen on the sagittal images. There is a large, lytic expansile lesion within the left eighth rib posteriorly. IMPRESSION: 1. Small pleural effusions with mild bibasilar atelectasis. 2. No adenopathy. 3. Diffuse changes of multiple myeloma with new pathologic fractures at T2 and T6. Electronically Signed   By: Richardean Sale M.D.   On: 10/09/2016 10:11   Dg Knee Complete 4 Views Left  Result Date: 10/09/2016 CLINICAL DATA:  Left knee pain and swelling without history of injury EXAM: LEFT KNEE - COMPLETE 4+ VIEW COMPARISON:  Portable postoperative images from ORIF of the left femoral shaft dated May 14, 2016 FINDINGS: The bones are subjectively osteopenic. There is a small suprapatellar effusion. There is minimal narrowing of the medial joint compartment. The tibial plateaus appear intact. There is irregularity of the articular surface of the lateral femoral condyle. The visualized portions of the intra medullary rod and anchoring cortical screw appear normal. IMPRESSION: Small suprapatellar effusion. Moderate degenerative change of the articular surface of the  lateral femoral condyles. Mild degenerative narrowing of the medial joint compartment. No acute fracture or dislocation. Electronically Signed   By: David  Martinique M.D.   On: 10/09/2016 08:02    ROS: As per HPI . Pt not able to give history.  Physical Exam: Vitals:  10/09/16 0839 10/09/16 0930 10/09/16 1041 10/09/16 1117  BP: 149/74 149/91 (!) 162/72   Pulse: 86  98   Resp: 22  20   Temp:   97.8 F (36.6 C)   TempSrc:   Oral   SpO2: 97%  97%   Weight:    60.5 kg (133 lb 6.1 oz)  Height:    _0  (1.905 m)     General: pale frail malnourished ill appearing WM  Head: NCAT sclera not icteric Neck: Supple.  Lungs: ^ resp coarse dim BS anteriorly, right chole tube in place capped off Heart: RRR  Abdomen: soft NT + BS M-S:  Wasting  Lower extremities :without edema or ischemic changes, no open wounds  Neuro: mild confusion,  Psych:  Responds to questions appropriately with a normal affect. Dialysis Access: right IJ and left lower AVF  Dialysis Orders: MWF AF - 4 hr 180 400/800 EDW 62 3 K 2.25 Ca right IJ and left lower AVF maturing Heparin 1900 Aranesp 100 on 10/18 and 200 on 10/25- no VDRA Recent labs: hgb 8.7 10/5 down to 7.6 10/16 and 7.2 10/25 Ca 13 up to 14.4 corrected 10/25 - not called to anyone P 8.7 iPTH 8  Assessment/Plan: 1. PNA cultured in ED - empiric antibiotics (Vanc and Aztreonam) /further work up per primary 2. MM - per primary 3. ESRD -  MWF - HD today - titrate edw k 4.9 2 K bath today- suspect he is losing weight -very low IDWG- was below EDW Friday- post HD wt 10/27 was 71.4 (EDW 62) 4. Hypercalcemia - likely due to MM - not on VDRA and only one phoslo as - will hold for now -use 2 Ca bath-corr Ca 14.8 today  may be contributing to confusion- ? Treat further or try to bring down with dialysis - repeat BMP in am 5. Confusion -Head CT neg acute findings - NH3 24 - etiology infection vs ^ Ca vs other 6. Hypertension/volume  - BP higher than usual - on midodrine  previously 10 tid- decrease volume 7. Anemia  - hgb 6.9 - transfuse 2 units - last ESA 200 on 10/25 - tsat 50% - no need for Fe - drop likely 2/2 MM- weekly ESA 8. Metabolic bone disease -no need for VDRA - holding binders - change to renvela 2 ac - start tomorrow 9. Severe PCM - alb 1.7 - on regular diet - add nepro/ 10. Thrombocytopenia - chronic follow  Myriam Jacobson, PA-C [place] [phone] 10/09/2016, 11:32 AM   Pt seen, examined and agree w A/P as above. Cough, possible PNA and confusion w new hypercalcemia, corr Ca approx 14.  Confusion is from high Ca.  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  Per last ONC note myeloma is stage III, refractory/ relapsed, on second line therapy, non-curative/ palliative intent.  Plan HD today with 30 min on zero Ca , get Ca down, needs sedation for agitation on HD, plan ativan.  Will add calcitonin as well.  [name] MD [employer] pager [phone]   10/09/2016, 12:48 PM

## 2016-10-09 NOTE — ED Notes (Signed)
PT CBG was 127. Nurse notified.

## 2016-10-09 NOTE — Progress Notes (Signed)
Pharmacy Antibiotic Note  Thomas [name] is a 70 y.o. male admitted on 10/09/2016 with fatigue and weakness. Pt has multiple myeloma and is ESRD on HD-MWF. His last HD session was on 10/27 and was stopped early. Pt receive aztreonam x1 in the ED. Starting empiric antibiotics for a possible pneumonia.  Currently afebrile, WBC 12.8, LA wnl.   Plan: -Vancomycin 1250 mg IV x1, then 731m IV qHD- not yet entered as HD plan is not yet clear (normally MWF) -Aztreonam 507mIV q12h- adjusted for correct HD dosing -Monitor HD schedule and tolerance -Monitor cultures, VR as needed  Height: _0  (190.5 cm) Weight: 135 lb (61.2 kg) IBW/kg (Calculated) : 84.5  Temp (24hrs), Avg:97.8 F (36.6 C), Min:97.7 F (36.5 C), Max:97.8 F (36.6 C)   Recent Labs Lab 10/09/16 0712 10/09/16 0724  WBC 12.8*  --   CREATININE 5.25*  --   LATICACIDVEN  --  0.74    Estimated Creatinine Clearance: 11.7 mL/min (by C-G formula based on SCr of 5.25 mg/dL (H)).    Allergies  Allergen Reactions  . Penicillins Rash and Other (See Comments)    Has patient had a PCN reaction causing immediate rash, facial/tongue/throat swelling, SOB or lightheadedness with hypotension: YES + Reaction causing SEVERE RASH involving MUCUS MEMBRANES or SKIN NECROSIS >> YES Reaction that required hospitalization: NO Reaction occurring within the last 10 years: NO If all of the above answers are "NO", then may proceed with Cephalosporin use.   . Dilaudid [Hydromorphone Hcl] Other (See Comments)    hallucinations  . Fish Allergy Nausea And Vomiting  . Codeine Other (See Comments)    "MAKES ME JUMPY"    Antimicrobials this admission: Vanc 10/30>> Aztreonam 10/30>>  Dose adjustments this admission: NA  Microbiology results: 10/30 blood cx:  Thank you for allowing pharmacy to be a part of this patient's care.  Elim Peale D. Bienvenido Proehl, PharmD, BCPS Clinical Pharmacist Pager: [phone]/30/2017 11:11 AM

## 2016-10-09 NOTE — Procedures (Signed)
  I was present at this dialysis session, have reviewed the session itself and made  appropriate changes [name] MD [place] pager [phone]   10/09/2016, 12:57 PM

## 2016-10-09 NOTE — Progress Notes (Signed)
New Admission Note:   Arrival Method: Stretcher Mental Orientation: A&0 x4 Telemetry: Initiated Assessment: Completed Skin: Stage II Pressure Ulcer to sacrum Iv: RAC Pain: 0 Tubes: Billi Drain  Safety Measures: Safety Fall Prevention Plan has been given, discussed and signed Admission: Completed Unit Orientation: Patient has been orientated to the room, unit and staff.  Family:  Orders have been reviewed and implemented. Will continue to monitor the patient. Call light has been placed within reach and bed alarm has been activated.    Dixie Dials RN, BSN

## 2016-10-09 NOTE — ED Triage Notes (Signed)
Pt in from home via [facility] with c/o generalized weakness, cough and drowsiness x 4 days. Per pt's wife, pt is a&ox4 baseline, denies fevers, chills or n/v/d. Hx of multiple myeloma, abd drain since 8/17 (s/p gallstone removal), HD MWF, last session Friday stopped early d/t weakness. Temp 98.3 on arrival, a&ox4.

## 2016-10-09 NOTE — ED Notes (Signed)
Pt taken to CT.

## 2016-10-09 NOTE — Consult Note (Signed)
[facility] CONSULT NOTE  Patient Care Team: No Pcp Per Patient as PCP - General (General Practice) Deirdre Peer, LCSW as Social Worker  CHIEF COMPLAINTS/PURPOSE OF CONSULTATION:  Requested per family members to review the patient, hospitalized for possible pneumonia and acute confusion  HISTORY OF PRESENTING ILLNESS:  Thomas [name] 70 y.o. male is admitted to the hospital after presentation with altered mental status. Over the weekend, he was noted to have intermittent confusion. His wife originally thought it could be related to oxycodone. She gave him prescription tramadol and he slept throughout yesterday and then have difficulties waking up today. She also reported persistent cough at home and diffuse bone pain. He was brought into the emergency department and was admitted. Blood work shows significant pancytopenia with evidence of severe hypercalcemia. He was admitted for further treatment  This patient is well known to me. Summary of oncologic history as follows:   Multiple myeloma not having achieved remission ([place])   05/09/2016 - 06/07/2016 Hospital Admission    The patient was hospitalized for pancytopenia, acute renal failure and back pain. He was diagnosed with multiple myeloma requiring multiple surgery and hemodialysis. He was started on chemotherapy while hospitalized      05/11/2016 Pathology Results    Accession: [id number] kidney biopsy confimed myeloma involvement      05/12/2016 Imaging    Skeletal survey showed multiple bone lesions are suggestive for metastatic bone disease and suspicious for multiple myeloma. There is imminent pathological fracture of the left femur      05/14/2016 Pathology Results    Accession: [id number] biopsy from left femur confirmed myeloma      05/16/2016 Pathology Results    Bone marrow complex cytogenetics. Please see report      05/16/2016 Imaging    MRI spine: large 6.0 x 9.0 x 7.5 cm lesion with associated  extraosseous extension of tumor into the left paraspinous soft tissues at L5 with moderate to severe canal stenosis. There is L3 compression fracture      05/16/2016 Bone Marrow Biopsy    Accession: [id number] bone marrow biopsy confirmed 60% plasma cell involvement      05/18/2016 - 09/18/2016 Chemotherapy    He was started on Velcade and dexamethasone. Treatment is stopped due to disease progression      07/03/2016 -  Chemotherapy    Low dose Revlimid is added      His pain appears to be better controlled since admission. He complained of excessive fatigue and intermittent relief fall asleep during evaluation. He is noted to have a productive cough. The patient denies any recent signs or symptoms of bleeding such as spontaneous epistaxis, hematuria or hematochezia. He was not able to complete hemodialysis last Friday due to fatigue and pain  MEDICAL HISTORY:  Past Medical History:  Diagnosis Date  . Anemia    ANEMIA OF RENAL DISEASE  . Arthritis   . Chronic kidney disease   . Compression fracture   . Dialysis patient ([place])   . Elevated LFTs 07/2016  . Gall stones   . GERD (gastroesophageal reflux disease)   . Hypotension   . Multiple myeloma ([place])   . Pneumonia   . Thrombocytopenia ([place]) 07/2016    SURGICAL HISTORY: Past Surgical History:  Procedure Laterality Date  . AV FISTULA PLACEMENT Left 05/24/2016   Procedure: ARTERIOVENOUS (AV) FISTULA CREATION-LEFT;  Surgeon: Conrad Elgin, MD;  Location: [facility];  Service: Vascular;  Laterality: Left;  . BONE BIOPSY Left 05/14/2016  Procedure: LEFT FEMORAL BIOPSY WITH INTRAOPERATIVE FROZEN SECTIONS ;  Surgeon: Rod Can, MD;  Location: [facility];  Service: Orthopedics;  Laterality: Left;  . ERCP N/A 09/22/2016   Procedure: ENDOSCOPIC RETROGRADE CHOLANGIOPANCREATOGRAPHY (ERCP);  Surgeon: Gatha Mayer, MD;  [facility] ENDOSCOPY;  Service: Endoscopy;  Laterality: N/A;  . FEMUR IM NAIL Left 05/14/2016   Procedure: INTRAMEDULLARY (IM)  NAIL FEMORAL;  Surgeon: Rod Can, MD;  Location: [facility];  Service: Orthopedics;  Laterality: Left;  . IR GENERIC HISTORICAL  07/17/2016   IR PERC CHOLECYSTOSTOMY 07/17/2016 Arne Cleveland, MD [facility]-INTERV RAD  . IR GENERIC HISTORICAL  08/30/2016   IR SINUS/FIST TUBE CHK-NON GI 08/30/2016 Greggory Keen, MD [facility]-INTERV RAD  . IR GENERIC HISTORICAL  09/15/2016   IR CHOLANGIOGRAM EXISTING TUBE 09/15/2016 Jacqulynn Cadet, MD [facility]INTER[facility] RAD  . IR GENERIC HISTORICAL  10/05/2016   IR SINUS/FIST TUBE CHK-NON GI 10/05/2016 Markus Daft, MD [facility]INTER[facility]  . MOUTH SURGERY    . RENAL BIOPSY    . SP CHOLECYSTOMY  07/2016   cholecystostomy drain placed under [place] and fluoro    SOCIAL HISTORY: Social History   Social History  . Marital status: Married    Spouse name: N/A  . Number of children: N/A  . Years of education: N/A   Occupational History  . Not on file.   Social History Main Topics  . Smoking status: Former Research scientist (life sciences)  . Smokeless tobacco: Never Used  . Alcohol use No  . Drug use: No  . Sexual activity: Not on file   Other Topics Concern  . Not on file   Social History Narrative  . No narrative on file    FAMILY HISTORY: Family History  Problem Relation Age of Onset  . Diabetes Father     ALLERGIES:  is allergic to penicillins; dilaudid [hydromorphone hcl]; fish allergy; and codeine.  MEDICATIONS:  Current Facility-Administered Medications  Medication Dose Route Frequency Provider Last Rate Last Dose  . 0.9 %  sodium chloride infusion  10 mL/hr Intravenous Once Sherwood Gambler, MD      . 0.9 %  sodium chloride infusion   Intravenous Continuous Monia Sabal, PA-C      . acyclovir (ZOVIRAX) 200 MG capsule 200 mg  200 mg Oral BID Norman Herrlich, MD      . calcitonin (MIACALCIN) injection 240 Units  240 Units Subcutaneous BID Roney Jaffe, MD      . dexamethasone (DECADRON) tablet 4 mg  4 mg Oral Daily Norman Herrlich, MD      . heparin injection 5,000 Units  5,000 Units Subcutaneous  Q8H Norman Herrlich, MD      . insulin aspart (novoLOG) injection 0-20 Units  0-20 Units Subcutaneous TID WC Norman Herrlich, MD      . LORazepam (ATIVAN) 2 MG/ML injection           . [START ON 10/10/2016] sevelamer carbonate (RENVELA) tablet 1,600 mg  1,600 mg Oral TID WC Alric Seton, PA-C      . zolendronic acid (ZOMETA) 4 mg in sodium chloride 0.9 % 100 mL IVPB  4 mg Intravenous Once Norman Herrlich, MD        REVIEW OF SYSTEMS:   Eyes: Denies blurriness of vision, double vision or watery eyes Ears, nose, mouth, throat, and face: Denies mucositis or sore throat Cardiovascular: Denies palpitation, chest discomfort or lower extremity swelling Gastrointestinal:  Denies nausea, heartburn or change in bowel habits Skin: Denies abnormal skin rashes Lymphatics: Denies  new lymphadenopathy or easy bruising Behavioral/Psych: Mood is stable, no new changes  All other systems were reviewed with the patient and are negative.  PHYSICAL EXAMINATION: ECOG PERFORMANCE STATUS: 3 - Symptomatic, >50% confined to bed  Vitals:   10/09/16 0930 10/09/16 1041  BP: 149/91 (!) 162/72  Pulse:  98  Resp:  20  Temp:  97.8 F (36.6 C)   Filed Weights   10/09/16 0658 10/09/16 1117  Weight: 135 lb (61.2 kg) 133 lb 6.1 oz (60.5 kg)    GENERAL:alert, no distress and comfortable. He looks frail, thin and cachectic SKIN: Diffuse skin bruises are noted  EYES: normal, conjunctiva are pale and non-injected, sclera clear OROPHARYNX:no exudate, no erythema and lips, buccal mucosa, and tongue normal  NECK: supple, thyroid normal size, non-tender, without nodularity LYMPH:  no palpable lymphadenopathy in the cervical, axillary or inguinal LUNGS: clear to auscultation and percussion with normal breathing effort HEART: Tachycardia, no murmurs and no lower extremity edema ABDOMEN:abdomen soft, non-tender and normal bowel sounds. Persistent drain noted Musculoskeletal:no cyanosis of digits and no clubbing  PSYCH:  alert & oriented x 3 with fluent speech NEURO: no focal motor/sensory deficits  LABORATORY DATA:  I have reviewed the data as listed Lab Results  Component Value Date   WBC 12.6 (H) 10/09/2016   HGB 6.6 (LL) 10/09/2016   HCT 21.2 (L) 10/09/2016   MCV 90.2 10/09/2016   PLT 84 (L) 10/09/2016    Recent Labs  07/14/16 0300  07/15/16 0840  08/02/16 0506  09/02/16 0435  09/18/16 1247 10/09/16 0712 10/09/16 1138  NA 132*  < >  --   < >  --   < > 133*  < > 134* 128* 129*  K 3.8  < >  --   < >  --   < > 3.9  < > 4.0 4.9 5.1  CL 99*  < >  --   < >  --   < > 106  --   --  94* 96*  CO2 21*  < >  --   < >  --   < > 16*  < > 26 24 20*  GLUCOSE 198*  < >  --   < >  --   < > 194*  < > 130 123* 139*  BUN 35*  < >  --   < >  --   < > 29*  < > 7.6 31* 35*  CREATININE 4.08*  < >  --   < >  --   < > 2.81*  < > 1.9* 5.25* 5.42*  CALCIUM 7.2*  < >  --   < >  --   < > 9.0  < > 9.8 12.8* 12.5*  GFRNONAA 14*  < >  --   < >  --   < > 22*  --   --  10* 10*  GFRAA 16*  < >  --   < >  --   < > 25*  --   --  12* 11*  PROT 5.3*  --  4.7*  < > 4.7*  < >  --   < > 10.5* RESULTS UNAVAILABLE DUE TO INTERFERING SUBSTANCE 10.2*  ALBUMIN 1.9*  < > 1.8*  < > 2.0*  < >  --   < > 2.5* 1.7* 1.7*  AST 44*  --  30  < > 47*  < >  --   < > 18 21 20  ALT 51  --  38  < > 68*  < >  --   < > 11 11* 10*  ALKPHOS 226*  --  187*  < > 137*  < >  --   < > 108 83 82  BILITOT 5.0*  --  2.9*  < > 0.9  < >  --   < > 0.55 0.5 0.7  BILIDIR 3.8*  --  1.9*  --  0.3  --   --   --   --   --   --   IBILI 1.2*  --  1.0*  --  0.6  --   --   --   --   --   --   < > = values in this interval not displayed.  RADIOGRAPHIC STUDIES:I reviewed CT scan of his chest I have personally reviewed the radiological images as listed and agreed with the findings in the report. Dg Chest 2 View  Result Date: 10/09/2016 CLINICAL DATA:  Chest pain EXAM: CHEST  2 VIEW COMPARISON:  Chest x-ray of August 31, 2016 FINDINGS: The lungs are less well inflated  today. There is increased density in the left lower lobe more conspicuous than on the previous study. The interstitial markings elsewhere in both lungs are mildly increased as well. The cardiac silhouette is enlarged. The pulmonary vascularity is prominent centrally but stable. The dual-lumen dialysis catheter tip projects over the distal third of the SVC. A drainage catheter projects in the right upper quadrant. There is calcification in the wall of the aortic arch. The bony thorax exhibits no acute abnormality. IMPRESSION: Increasing density in the left lower lobe consistent with progressive atelectasis or pneumonia. Increased interstitial prominence bilaterally consistent with low-grade interstitial edema of cardiac or noncardiac cause. Chest CT scanning is recommended to exclude occult malignancy with central obstructing lesion. Electronically Signed   By: David  Martinique M.D.   On: 10/09/2016 08:00   Ct Head Wo Contrast  Result Date: 10/09/2016 CLINICAL DATA:  Generalized weakness. Drowsiness over the last 4 days. Altered mental status. Dialysis patient. Multiple myeloma. EXAM: CT HEAD WITHOUT CONTRAST TECHNIQUE: Contiguous axial images were obtained from the base of the skull through the vertex without intravenous contrast. COMPARISON:  None. FINDINGS: Brain: The brain shows generalized atrophy. No evidence of old or acute focal infarction, mass lesion, hemorrhage, hydrocephalus or extra-axial collection. Vascular: There is atherosclerotic calcification of the major vessels at the base of the brain. Skull: Multiple lytic foci within the calvarium consistent with the clinical history of multiple myeloma. No dominant lesion or lesion associated with extraosseous mass. Sinuses/Orbits: Sinuses clear except for minimal mucosal thickening of the right maxillary sinus. Orbits appear unremarkable. Other: None significant IMPRESSION: Generalized atrophy.  No acute or focal intracranial finding. Multiple calvarial  lucencies consistent with the clinical diagnosis of multiple myeloma. Electronically Signed   By: Nelson Chimes M.D.   On: 10/09/2016 08:27   Ct Chest Wo Contrast  Result Date: 10/09/2016 CLINICAL DATA:  Generalized weakness with cough and lethargy for 4 days. Multiple myeloma. EXAM: CT CHEST WITHOUT CONTRAST TECHNIQUE: Multidetector CT imaging of the chest was performed following the standard protocol without IV contrast. COMPARISON:  Chest radiographs 10/09/2016, thoracic spine radiographs 08/30/2016 and thoracic MRI 05/15/2016. FINDINGS: Cardiovascular: Right IJ dialysis catheters extend to the SVC right atrial junction. There is mild atherosclerosis of the aorta, great vessels and coronary arteries. No acute vascular findings are seen on noncontrast imaging. The heart is mildly enlarged. There  is no pericardial effusion. Mediastinum/Nodes: There are no enlarged mediastinal, hilar or axillary lymph nodes.Hilar assessment is limited by the lack of intravenous contrast. The thyroid gland, trachea and esophagus demonstrate no significant findings. Lungs/Pleura: There are small left-greater-than-right pleural effusions. Both lungs demonstrate mild dependent atelectasis and central airway thickening. There is no consolidation or suspicious pulmonary nodule. Upper abdomen:  The visualized upper abdomen appears unremarkable. Musculoskeletal/Chest wall: Numerous lytic lesions are again noted consistent with multiple myeloma. There are progressive mild pathologic fractures at T2 and T6. There is a probable pathologic fracture of the upper sternal body, best seen on the sagittal images. There is a large, lytic expansile lesion within the left eighth rib posteriorly. IMPRESSION: 1. Small pleural effusions with mild bibasilar atelectasis. 2. No adenopathy. 3. Diffuse changes of multiple myeloma with new pathologic fractures at T2 and T6. Electronically Signed   By: Richardean Sale M.D.   On: 10/09/2016 10:11   Ir  Sinus/fist Tube Chk-non Gi  Result Date: 10/05/2016 INDICATION: History of calculus cholecystitis and sepsis. Patient has a percutaneous cholecystostomy tube and underwent a ERCP on 09/22/2016. Re-evaluate the biliary system for possible cholecystostomy tube capping. EXAM: SINUS TRACT INJECTION / FISTULOGRAM COMPARISON:  None. MEDICATIONS: None ANESTHESIA/SEDATION: None COMPLICATIONS: None immediate. FLUOROSCOPY TIME:  42 seconds, 2.2 mGy TECHNIQUE: Patient was placed supine on the interventional table. Contrast was injected through the existing cholecystostomy tube. PROCEDURE: Cholecystostomy tube injected.  Catheter was flushed and capped. FINDINGS: Cholecystostomy tube is coiled in the gallbladder. The cystic duct and common bile duct are patent. Contrast drains into the duodenum. There is minor filling of the intrahepatic system. Subtle filling defects in the proximal common bile duct may represent sludge. IMPRESSION: Cystic duct and common bile duct are patent. There may be a small amount of nonobstructive sludge present. The cholecystostomy tube was flushed and capped. Capping and flushing instructions were discussed with the patient and his family. Patient was instructed to uncap the tube and attach to an external drainage bag if he develops new pain or fevers. Electronically Signed   By: Markus Daft M.D.   On: 10/05/2016 10:12   Dg Knee Complete 4 Views Left  Result Date: 10/09/2016 CLINICAL DATA:  Left knee pain and swelling without history of injury EXAM: LEFT KNEE - COMPLETE 4+ VIEW COMPARISON:  Portable postoperative images from ORIF of the left femoral shaft dated May 14, 2016 FINDINGS: The bones are subjectively osteopenic. There is a small suprapatellar effusion. There is minimal narrowing of the medial joint compartment. The tibial plateaus appear intact. There is irregularity of the articular surface of the lateral femoral condyle. The visualized portions of the intra medullary rod and  anchoring cortical screw appear normal. IMPRESSION: Small suprapatellar effusion. Moderate degenerative change of the articular surface of the lateral femoral condyles. Mild degenerative narrowing of the medial joint compartment. No acute fracture or dislocation. Electronically Signed   By: David  Martinique M.D.   On: 10/09/2016 08:02   Dg Ercp Biliary & Pancreatic Ducts  Result Date: 09/22/2016 CLINICAL DATA:  Bile duct stone. EXAM: ERCP TECHNIQUE: Multiple spot images obtained with the fluoroscopic device and submitted for interpretation post-procedure. COMPARISON:  Ultrasound fluoroscopic guided cholecystostomy tube placement - 07/17/2016; percutaneous cholangiogram - 09/15/2016. FINDINGS: Seven spot intraoperative fluoroscopic images of the right upper abdominal quadrant during ERCP are provided for review Initial image demonstrates an ERCP probe overlying the right upper abdominal quadrant. A percutaneous cholecystostomy tube overlies the expected location of the gallbladder fossa. Subsequent images  demonstrate selective cannulation opacification of the common bile duct. Common bile duct appears mildly dilated. There is minimal opacification of the cystic duct and central aspect of the intrahepatic biliary system. There are no discrete filling defects are seen with the opacified portions of the biliary tree. Subsequent images demonstrate insufflation of a balloon within the central aspect of the common bile duct with subsequent sweeping and presumed biliary sphincterotomy. IMPRESSION: ERCP with biliary sweeping and presumed sphincterotomy as above. These images were submitted for radiologic interpretation only. Please see the procedural report for the amount of contrast and the fluoroscopy time utilized. Electronically Signed   By: Sandi Mariscal M.D.   On: 09/22/2016 15:44   Ir Cholangiogram Existing Tube  Result Date: 09/15/2016 INDICATION: 70 year old male with a history of acalculous cholecystitis  treated by percutaneous cholecystostomy tube placement on 07/17/2016. Subsequent drain injection revealed an obstructing filling defect at the distal common bile duct concerning for choledocholithiasis. Patient has an outpatient ERCP procedure scheduled for next week. He presents today for repeat drain injection. EXAM: CHOLANGIOGRAM VIA EXISTING CATHETER MEDICATIONS: None required ANESTHESIA/SEDATION: None FLUOROSCOPY TIME:  Fluoroscopy Time: 0 minutes 18 seconds (3 mGy). COMPLICATIONS: None immediate. PROCEDURE: Informed written consent was obtained from the patient after a thorough discussion of the procedural risks, benefits and alternatives. All questions were addressed. A timeout was performed prior to the initiation of the procedure. A hand injection of contrast material was performed. The tube remains coiled within the gallbladder lumen. No definite cholelithiasis. The cystic duct is patent. Interval resolution of obstruction at the distal common bile duct. The previously noted filling defect appears toe mobilized into the mid common duct. Contrast material passes freely through the ampulla and into the duodenum. IMPRESSION: 1. Well-positioned and functioning percutaneous cholecystostomy tube. 2. Interval resolution of distal common bile duct obstruction. It appears that the obstructing stone has mobilized, likely into the mid common duct. PLAN: 1. The percutaneous cholecystostomy tube was capped. The patient was sent home with an extra drainage bag and instructed to connect the drainage bag should he have any symptoms of right upper quadrant pain, pain after being, fever, chills. 2. Continue with ERCP as scheduled next week. Once the common duct is fully cleared, the percutaneous cholecystostomy tube can likely be removed. Electronically Signed   By: Jacqulynn Cadet M.D.   On: 09/15/2016 11:34    ASSESSMENT & PLAN:   Multiple myeloma not having achieved remission ([place]) Unfortunately, the patient have  evidence of disease progression. I suspect is due to frequent interruption of his treatment given his other major comorbidities. The patient has significant decline in overall performance status with recurrent infection and currently is oliguric.The patient is aware that the response rates discussed earlier is not guaranteed.   From recent extensive discussion on 09/23/2016, the patient is supposed to start palliative chemotherapy tomorrow in the clinic However, he is now readmitted again for possible pneumonia/sepsis I told the patient his prognosis is very poor and it is not clear to me that he will improve to the point that he could resume palliative treatment For now, I recommend supportive care alone and continue on low-dose dexamethasone daily  Antineoplastic chemotherapy induced pancytopenia ([place]) The cause of the pancytopenia is multifactorial. His severe anemia is likely due to progression of disease on background history of end-stage renal dialysis I recommendblood transfusion to keep hemoglobin greater than 7 No need platelet transfusion unless bleeding less than 10,000  Acalculous cholecystitis He has recent ERCP. Hopefully, the biliary  drainage tube can be removed soon  Closed fracture of shaft of left femur ([place]) Continue high-dose vitamin D. He will continue physical therapy as tolerated  Cancer associated pain  I recommend IV pain medicine as tolerated  ESRD (end stage renal disease) on dialysis [facility]) He is receiving hemodialysis in the outpatient on Mondays, Wednesdays and Fridays  Vitamin B12 deficiency He will continue vitamin B-12 supplement. I plan to recheck his serum vitamin B-12 level tomorrow  Recent diarrhea Diarrhea has improved since recent ERCP. Continue close observation. He has Imodium to take as needed  Confusion Likely due to hypercalcemia and infection Continue hydration as tolerated  Malignant hypercalcemia Continue high-dose  steroids along with hemodialysis This will unlikely get better without chemotherapy but the patient is currently not a chemotherapy candidate  Pneumonia on CT He is placed on broad-spectrum IV antibiotics  Protein-calorie malnutrition, severe ([place]) I continue to encourage him to increase oral intake as tolerated  Goals of care, counseling/discussion I have a long discussion with the patient and family members. We discussed goals of care. His overall prognosis is poor due to multiple comorbidities and anticipated long-term hemodialysis. The patient is prone to recurrent infection and severe pancytopenia. We discussed advanced directives. He appointed his wife as his MPOA. We discussed CODE STATUS. He desires full code. We discussed potential placement of feeding tube in the future if necessary and the patient is not opposed to it. All questions were answered. The patient knows to call the clinic with any problems, questions or concerns.     Heath Lark, MD 10/09/2016 1:38 PM

## 2016-10-09 NOTE — H&P (Signed)
Date: 10/09/2016               Patient Name:  Thomas [name] MRN: [id number]  DOB: October 12, 1946 Age / Sex: 70 y.o., male   PCP: No Pcp Per Patient         Medical Service: Internal Medicine Teaching Service         Attending Physician: Dr. Sid Falcon, MD    First Contact: Dr. Reesa Chew Pager: [phone]  Second Contact: Dr. Juleen China Pager: [phone]       After Hours (After 5p/  First Contact Pager: [phone]  weekends / holidays): Second Contact Pager: [phone]   Chief Complaint: weakness and confusion  History of Present Illness:  70 year old male with past medical history of multiple myeloma on steroids, thrombocytopenia, end-stage renal disease on hemodialysis, acalculous cholecystitis status post ERCP with biliary tube and anemia who presents with weakness and confusion. Patient's wife was trying to wake him up for dialysis this morning and noticed that he was confused and lethargic. Three days ago patient was having hallucinations secondary to pain medication for his multiple myeloma, his oncologist Dr. Alvy Bimler was contacted and his pain medication was changed from 2.5 mg oxycodone to 50 mg of tramadol. He then took 1 tablet of tramadol yesterday at 5:30 PM and went to sleep until his wife woke him up at 4:30 this morning when he was noted to be lethargic and confused. Since Thursday patient has had decreased appetite and poor by mouth intake. He has also had increased cough productive of green sputum, although wife states that he did not have any issues with this yesterday denies fevers, night sweats, blood in his stools, black stools, nausea, vomiting.   In the ED hgb was 6.9. CXR w/ LLL PNA vs atelectasis. CT chest revealed small pleural effusions with mild bibasilar atelectasis and new pathologic fractures at T2 and T6; it was negative for PNA.     Meds:  Current Meds  Medication Sig  . acetaminophen (TYLENOL) 325 MG tablet Take 2 tablets (650 mg total) by mouth every 6 (six) hours as  needed for mild pain (or Fever >/= 101).  Marland Kitchen acyclovir (ZOVIRAX) 200 MG capsule Take 1 capsule (200 mg total) by mouth 2 (two) times daily.  Marland Kitchen ALPRAZolam (XANAX) 0.25 MG tablet Take 1 tablet (0.25 mg total) by mouth 2 (two) times daily as needed for anxiety.  . calcium acetate (PHOSLO) 667 MG capsule Take 1 capsule (667 mg total) by mouth 2 (two) times daily with a meal.  . Cholecalciferol (VITAMIN D3) 50000 units CAPS Take 1 capsule by mouth once a week.  . cyanocobalamin 1000 MCG tablet Take 1 tablet (1,000 mcg total) by mouth daily.  Marland Kitchen dexamethasone (DECADRON) 2 MG tablet Take 4 mg by mouth daily. On Sunday, before first chemo treatment.  . diphenoxylate-atropine (LOMOTIL) 2.5-0.025 MG tablet Take 1 tablet by mouth 4 (four) times daily as needed for diarrhea or loose stools.  . insulin aspart (NOVOLOG) 100 UNIT/ML injection Before each meal 3 times a day, 140-199 - 2 units, 200-250 - 4 units, 251-299 - 6 units,  300-349 - 8 units,  350 or above 10 units. Dispense syringes and needles as needed, Ok to switch to PEN if approved. Substitute to any brand approved. DX DM2, Code E11.65  . loratadine (CLARITIN) 10 MG tablet Take 10 mg by mouth daily.   . methocarbamol (ROBAXIN) 500 MG tablet Take 500 mg by mouth every 6 (six) hours as needed  for muscle spasms. Takes a half tablet as needed  . midodrine (PROAMATINE) 10 MG tablet Take 1 tablet (10 mg total) by mouth 3 (three) times daily with meals.  . multivitamin (RENA-VIT) TABS tablet Take 1 tablet by mouth at bedtime.  . ondansetron (ZOFRAN) 8 MG tablet Take 1 tablet (8 mg total) by mouth every 8 (eight) hours as needed for nausea.  Marland Kitchen oxyCODONE (OXY IR/ROXICODONE) 5 MG immediate release tablet Take 1 tablet (5 mg total) by mouth every 4 (four) hours as needed for moderate pain. (Patient taking differently: Take 2.5-10 mg by mouth every 4 (four) hours as needed for moderate pain. )  . pantoprazole (PROTONIX) 40 MG tablet Take 1 tablet (40 mg total) by  mouth daily.  . polyethylene glycol (MIRALAX / GLYCOLAX) packet Take 17 g by mouth daily as needed for mild constipation or moderate constipation.  . protein supplement (RESOURCE BENEPROTEIN) POWD Take 6 g by mouth 3 (three) times daily with meals.  . saccharomyces boulardii (FLORASTOR) 250 MG capsule Take 1 capsule (250 mg total) by mouth 2 (two) times daily.     Allergies: Allergies as of 10/09/2016 - Review Complete 10/09/2016  Allergen Reaction Noted  . Penicillins Rash and Other (See Comments) 05/09/2016  . Dilaudid [hydromorphone hcl] Other (See Comments) 05/16/2016  . Fish allergy Nausea And Vomiting 07/17/2016  . Codeine Other (See Comments) 05/10/2016   Past Medical History:  Diagnosis Date  . Anemia    ANEMIA OF RENAL DISEASE  . Arthritis   . Chronic kidney disease   . Compression fracture   . Dialysis patient ([place])   . Elevated LFTs 07/2016  . Gall stones   . GERD (gastroesophageal reflux disease)   . Hypotension   . Multiple myeloma ([place])   . Pneumonia   . Thrombocytopenia ([place]) 07/2016    Family History  Problem Relation Age of Onset  . Diabetes Father     Social History   Social History  . Marital status: Married    Spouse name: N/A  . Number of children: N/A  . Years of education: N/A   Occupational History  . Not on file.   Social History Main Topics  . Smoking status: Former Research scientist (life sciences)  . Smokeless tobacco: Never Used  . Alcohol use No  . Drug use: No  . Sexual activity: Not on file   Other Topics Concern  . Not on file   Social History Narrative  . No narrative on file    Review of Systems: A complete ROS was negative except as per HPI.   Physical Exam: Blood pressure 149/74, pulse 86, temperature 97.7 F (36.5 C), temperature source Rectal, resp. rate 22, height '6\' 3"'$  (1.905 m), weight 135 lb (61.2 kg), SpO2 97 %. Physical Exam  Constitutional: He appears well-developed.  Thin, elderly   HENT:  Head: Normocephalic.    Mouth/Throat: No oropharyngeal exudate.  Dry mucous membranes   Eyes: Conjunctivae and EOM are normal. Pupils are equal, round, and reactive to light. No scleral icterus.  Cardiovascular: Normal rate and regular rhythm.  Exam reveals no gallop and no friction rub.   No murmur heard. Pulmonary/Chest: Effort normal. No respiratory distress. He has no wheezes. He has no rales.  Coarse breath sounds  Abdominal: Soft. Bowel sounds are normal. He exhibits no distension and no mass. There is no tenderness. There is no rebound and no guarding.  Bilary drain tube intact with green discharge around tube   Musculoskeletal: He exhibits no  edema.  Neurological: He is alert.  Oriented to person and place but not year, moving all 4 extremities    Skin: Skin is warm and dry. He is not diaphoretic. No erythema.    EKG: normal sinus rhythm  CXR: LLL atelectasis vs PNA CT chest: small pleural effusions w/ mild bibasilar atelectasis, new fractures at T2 and T6 CT head: neg for acute intracranial findings XRAY left knee: small suprapatellar effusion, neg for acute fracture or dislocation   Assessment & Plan by Problem: Principal Problem:   Altered mental status Active Problems:   Anemia due to bone marrow failure (HCC)   ESRD on dialysis (HCC)   Antineoplastic chemotherapy induced pancytopenia (HCC)   Bilateral low back pain without sciatica   Acute respiratory failure with hypoxia (HCC)   Acalculous cholecystitis   Cancer associated pain   Lethargy   Weakness  AMS-- likely multifactorial due to polypharmacy as he is on muscle relaxants, narcotics, and benzos at home combined w/ poor po intake . He was given a dose of aztreonam and vancomycin in the ED due to concern for PNA noted on CXR however CT chest was negative for any consolidation. He does have an elevated white count the could be due to steroids vs reactive from anemia.  - admit to tele - d/c IV abx - hold sedating meds - pt due for  HD today, will hold off on IVF - reg diet - flu PCR - CBC in the AM - nutrition consult for protein calorie malnutrition  Anemia-- hgb 6.9. Likely multifactorial 2/2 progression of MM and ESRD. 1 unit of RBCs transfusion ordered in the ED. Denies GI bleed, hemoptysis, and vomiting.  - follow up post transfusion CBC - receives ESA with HD  ESRD on HD-- on MWF HD at [facility] - Nephrology following, appreciate recommendations  Multiple Myeloma -- CT chest w/ new pathologic fractures at T2 and T6. Denies urinary and bowel incontinence.  - continue dexamethasone 51m daily  DM--  - SSI- R  Thrombocytopenia--likely 2/2 chemothearpy. plts 96, stable. No active signs of bleeding. CTM  Code: FULL DVT ppx - hep Dispo: Admit patient to Inpatient with expected length of stay greater than 2 midnights.  Signed: DNorman Herrlich MD 10/09/2016, 9:15 AM  Pager: [phone]

## 2016-10-10 ENCOUNTER — Ambulatory Visit: Payer: PPO

## 2016-10-10 ENCOUNTER — Other Ambulatory Visit: Payer: PPO

## 2016-10-10 ENCOUNTER — Ambulatory Visit: Payer: PPO | Admitting: Hematology and Oncology

## 2016-10-10 DIAGNOSIS — Z794 Long term (current) use of insulin: Secondary | ICD-10-CM

## 2016-10-10 DIAGNOSIS — L8990 Pressure ulcer of unspecified site, unspecified stage: Secondary | ICD-10-CM | POA: Insufficient documentation

## 2016-10-10 DIAGNOSIS — D649 Anemia, unspecified: Secondary | ICD-10-CM

## 2016-10-10 DIAGNOSIS — D72829 Elevated white blood cell count, unspecified: Secondary | ICD-10-CM

## 2016-10-10 DIAGNOSIS — R05 Cough: Secondary | ICD-10-CM

## 2016-10-10 DIAGNOSIS — D696 Thrombocytopenia, unspecified: Secondary | ICD-10-CM

## 2016-10-10 DIAGNOSIS — E1122 Type 2 diabetes mellitus with diabetic chronic kidney disease: Secondary | ICD-10-CM

## 2016-10-10 DIAGNOSIS — R410 Disorientation, unspecified: Secondary | ICD-10-CM

## 2016-10-10 DIAGNOSIS — E43 Unspecified severe protein-calorie malnutrition: Secondary | ICD-10-CM | POA: Insufficient documentation

## 2016-10-10 LAB — CBC
HEMATOCRIT: 26.5 % — AB (ref 39.0–52.0)
Hemoglobin: 8.5 g/dL — ABNORMAL LOW (ref 13.0–17.0)
MCH: 28.4 pg (ref 26.0–34.0)
MCHC: 32.1 g/dL (ref 30.0–36.0)
MCV: 88.6 fL (ref 78.0–100.0)
Platelets: 73 10*3/uL — ABNORMAL LOW (ref 150–400)
RBC: 2.99 MIL/uL — AB (ref 4.22–5.81)
RDW: 16.3 % — AB (ref 11.5–15.5)
WBC: 9.5 10*3/uL (ref 4.0–10.5)

## 2016-10-10 LAB — RENAL FUNCTION PANEL
ALBUMIN: 1.7 g/dL — AB (ref 3.5–5.0)
ANION GAP: 9 (ref 5–15)
BUN: 17 mg/dL (ref 6–20)
CALCIUM: 10.1 mg/dL (ref 8.9–10.3)
CO2: 26 mmol/L (ref 22–32)
Chloride: 95 mmol/L — ABNORMAL LOW (ref 101–111)
Creatinine, Ser: 2.62 mg/dL — ABNORMAL HIGH (ref 0.61–1.24)
GFR calc Af Amer: 27 mL/min — ABNORMAL LOW (ref >60)
GFR, EST NON AFRICAN AMERICAN: 23 mL/min — AB (ref >60)
GLUCOSE: 89 mg/dL (ref 65–99)
PHOSPHORUS: 4.1 mg/dL (ref 2.5–4.6)
Potassium: 3.2 mmol/L — ABNORMAL LOW (ref 3.5–5.1)
SODIUM: 130 mmol/L — AB (ref 135–145)

## 2016-10-10 LAB — GLUCOSE, CAPILLARY
GLUCOSE-CAPILLARY: 99 mg/dL (ref 65–99)
GLUCOSE-CAPILLARY: 129 mg/dL — AB (ref 65–99)
GLUCOSE-CAPILLARY: 111 mg/dL — AB (ref 65–99)
Glucose-Capillary: 86 mg/dL (ref 65–99)

## 2016-10-10 LAB — VITAMIN B12: Vitamin B-12: 3018 pg/mL — ABNORMAL HIGH (ref 180–914)

## 2016-10-10 MED ORDER — DEXTROSE 5 % IV SOLN
500.0000 mg | Freq: Two times a day (BID) | INTRAVENOUS | Status: DC
  Administered 2016-10-10: 500 mg via INTRAVENOUS
  Filled 2016-10-10 (×3): qty 0.5

## 2016-10-10 MED ORDER — DEXTROSE 5 % IV SOLN
500.0000 mg | Freq: Two times a day (BID) | INTRAVENOUS | Status: DC
  Administered 2016-10-10 – 2016-10-11 (×2): 500 mg via INTRAVENOUS
  Filled 2016-10-10 (×3): qty 0.5

## 2016-10-10 MED ORDER — SODIUM CHLORIDE 0.9 % IV SOLN
60.0000 mg | Freq: Once | INTRAVENOUS | Status: AC
  Administered 2016-10-10: 60 mg via INTRAVENOUS
  Filled 2016-10-10: qty 20

## 2016-10-10 MED ORDER — DARBEPOETIN ALFA 200 MCG/0.4ML IJ SOSY
200.0000 ug | PREFILLED_SYRINGE | INTRAMUSCULAR | Status: DC
  Administered 2016-10-11: 200 ug via INTRAVENOUS
  Filled 2016-10-10: qty 0.4

## 2016-10-10 NOTE — Consult Note (Signed)
   [facility] CM Inpatient Consult   10/10/2016  Thomas [name] [birth year] [id number]    Thomas [name] has been active with [facility]. He has been actively followed by [employer] Licensed CSW. Spoke with Thomas [name] daughter and wife at bedside. Thomas [name] was sleeping soundly. Discussed re-engaging with [facility] RNCM post hospital discharge. Mrs. Padberg states " I am not sure what the plan will be, will let you know at a later time ". Accepted contact information. Made inpatient RNCM aware that Thomas [name] is followed by [facility] program. Will continue to follow.    Marthenia Rolling, MSN-Ed, RN,BSN [facility] Liaison [phone]

## 2016-10-10 NOTE — Progress Notes (Signed)
Pt spouse reports that pt was told by radiologist to flush R biliary drain QOD with 5 ml of NS. Page to on-call with return call from Dr. Reesa Chew. New orders to be placed for drain care. Will continue to monitor. Dorthey Sawyer, RN

## 2016-10-10 NOTE — Progress Notes (Signed)
[facility] Progress Note  Dialysis Orders:  MWF AF - 4 hr 180 400/800 EDW 62 3 K 2.25 Ca right IJ and left lower AVF maturing Heparin 1900 Aranesp 100 on 10/18 and 200 on 10/25- no VDRA Recent labs: hgb 8.7 10/5 down to 7.6 10/16 and 7.2 10/25 Ca 13 up to 14.4 corrected 10/25 - not called to anyone P 8.7 iPTH 8  Assessment/Plan: 1. PNA - per primary cont Vanc/aztreonam - blood/sputum cx pending  2. MM - per primary onc consulted 3. ESRD - MWF cont on schedule -  K 3.2 - HD with 4K bath  3. Anemia - hgb 8.5 post transfusion (2 units 10/30) -  Aranesp 200 with HD tomorrow  4. Secondary hyperparathyroidism -  Corr Ca 11.94 < 14.8 - no VDRA/binder P 4.1  5. HTN/volume - BP^ holding BP meds -  titrate edw down  Post HD wt yesterday 60.8kg net UF 1.5L  - Plan HD UF goal 1-2L  6. Nutrition - severe PCM - poor PO intake - liberal diet/protein supp  7. Confusion - Improving? Unclear of baseline - Head CT no acute findings  8. Thrombocytopenia - chronic   Thomas Child PA-C [facility] Pager [phone] 10/10/2016,10:29 AM  LOS: 1 day   Pt seen, examined and agree w A/P as above. HyperCa++ better and MS better.  Cont calcitonin another 24 hrs or so.  HD tomorrow, low Ca bath 2.0.  Will give pamidronate reduced does one time ordered.   Thomas Splinter MD [employer] pager [phone]   10/10/2016, 2:42 PM    Subjective: Calm today, daughter at bedside says confusion has improved. Aware that he is in hospital. Breathing improved since yesterday. Ate some breakfast today. Agitated during HD yesterday.   Objective Vitals:   10/09/16 1736 10/09/16 2056 10/10/16 0618 10/10/16 0925  BP: (!) 140/59 (!) 142/75 (!) 145/70 (!) 145/64  Pulse: 88 88 92 96  Resp: (!) 22 20 18 16   Temp: 98.2 F (36.8 C) 97.5 F (36.4 C) 97.6 F (36.4 C) 97.7 F (36.5 C)  TempSrc: Axillary Axillary Axillary Oral  SpO2: 98% 98% 97% 100%  Weight:  60.4 kg (133 lb  2.5 oz)    Height:       Physical Exam General: Frail, chronically ill appearing elderly WM  Heart: RRR  Lungs: diminished breath sounds bilat - breathing unlabored  Abdomen: NT/ND Extremities: no LE edema  Dialysis Access: R IJ TDC/ L AVF maturing   Additional Objective Labs: Basic Metabolic Panel:  Recent Labs Lab 10/09/16 0712 10/09/16 1138 10/10/16 0519  NA 128* 129* 130*  K 4.9 5.1 3.2*  CL 94* 96* 95*  CO2 24 20* 26  GLUCOSE 123* 139* 89  BUN 31* 35* 17  CREATININE 5.25* 5.42* 2.62*  CALCIUM 12.8* 12.5* 10.1  PHOS  --   --  4.1   Liver Function Tests:  Recent Labs Lab 10/09/16 0712 10/09/16 1138 10/10/16 0519  AST 21 20  --   ALT 11* 10*  --   ALKPHOS 83 82  --   BILITOT 0.5 0.7  --   PROT RESULTS UNAVAILABLE DUE TO INTERFERING SUBSTANCE 10.2*  --   ALBUMIN 1.7* 1.7* 1.7*   No results for input(s): LIPASE, AMYLASE in the last 168 hours. CBC:  Recent Labs Lab 10/09/16 0712 10/09/16 1138 10/09/16 2024 10/10/16 0523  WBC 12.8* 12.6*  --  9.5  NEUTROABS 11.1*  --   --   --  HGB 6.9* 6.6* 9.0* 8.5*  HCT 21.6* 21.2* 27.4* 26.5*  MCV 89.3 90.2  --  88.6  PLT 96* 84*  --  73*   Blood Culture    Component Value Date/Time   SDES URINE, RANDOM 09/01/2016 0237   SPECREQUEST NONE 09/01/2016 0237   CULT MULTIPLE SPECIES PRESENT, SUGGEST RECOLLECTION (A) 09/01/2016 0237   REPTSTATUS 09/02/2016 FINAL 09/01/2016 0237    Cardiac Enzymes:  Recent Labs Lab 10/09/16 0712  TROPONINI 0.03*   CBG:  Recent Labs Lab 10/09/16 0850 10/09/16 1726 10/09/16 2031 10/10/16 0757  GLUCAP 127* 125* 83 86   Iron Studies: No results for input(s): IRON, TIBC, TRANSFERRIN, FERRITIN in the last 72 hours. Lab Results  Component Value Date   INR 1.30 10/09/2016   INR 1.55 10/09/2016   INR 1.65 07/17/2016   Medications: . sodium chloride     . acyclovir  200 mg Oral BID  . calcitonin  240 Units Subcutaneous BID  . dexamethasone  4 mg Oral Daily  . heparin   5,000 Units Subcutaneous Q8H  . insulin aspart  0-20 Units Subcutaneous TID WC  . sevelamer carbonate  1,600 mg Oral TID WC

## 2016-10-10 NOTE — Progress Notes (Signed)
Initial Nutrition Assessment  DOCUMENTATION CODES:   Severe malnutrition in context of chronic illness  INTERVENTION:   - Provide Magic Cup oral nutrition supplement TID with meals. Each provides 290 kcal and 9 grams protein. - Will continue to follow for nutrition-related needs.  NUTRITION DIAGNOSIS:   Malnutrition related to chronic illness as evidenced by severe depletion of body fat, severe depletion of muscle mass, percent weight loss of 13.6% in less than 2 months.  GOAL:   Patient will meet greater than or equal to 90% of their needs  MONITOR:   PO intake, Supplement acceptance, Labs, Weight trends, Skin  REASON FOR ASSESSMENT:   Malnutrition Screening Tool   ASSESSMENT:   70 year old male with past medical history of multiple myeloma on steroids, thrombocytopenia, end-stage renal disease on hemodialysis, acalculous cholecystitis status post ERCP with biliary tube and anemia who presents with weakness and confusion. He has also had increased cough productive of green sputum, although wife states that he did not have any issues with this yesterday denies fevers, night sweats, blood in his stools, black stools, nausea, vomiting.  Spoke with pt's wife at bedside as pt was sleeping and reported to be disoriented. Pt's wife reports pt has had a poor appetite for the past week PTA. While in the hospital, pt has been consuming 0-25% of meals. Before time period of poor appetite, pt had a fair appetite and would consume 3 meals per day. Pt's wife states pt consumes pudding with protein powder twice per day between meals and prescription vitamins. Per pt's wife, pt consumed a low-sodium and low-potassium diet PTA but has not been following this recently as doctors told wife "he just needs to eat."  Pt's wife reports pt's UBW is 180# and that he last weighed this in May 2017. Per weight hx on chart, pt has lost 13.6% of body weight in less than 2 months (significant for  timeframe).  Per pt's wife, pt dislikes all oral nutrition supplements except Magic Cup. Will order TID with meals (not chocolate flavor). Pt's wife also expressed interest in pt receiving snacks as long as they are soft and moist as pt's dentures no longer fit due to weight loss. Will order snacks between meals.  Medications reviewed and include 5,000 units heparin TID and PRN heparin, sliding scale Novolog  Labs reviewed and include low sodium (130 mmol/L), low potassium (3.2 mmol/L), elevated creatinine (2.62 mg/dL) CBGs: 83-86 mg/dL  NFPE: Exam completed. Severe fat depletion, severe muscle depletion, and no edema noted.  Diet Order:  Diet regular Room service appropriate? Yes; Fluid consistency: Thin; Fluid restriction: 1200 mL Fluid  Skin:  Wound (see comment) (St II pressure injury to coccyx)  Last BM:  10/10/16  Height:   Ht Readings from Last 1 Encounters:  10/09/16 6' 3"  (1.905 m)    Weight:   Wt Readings from Last 1 Encounters:  10/09/16 133 lb 2.5 oz (60.4 kg)    Ideal Body Weight:  89.1 kg  BMI:  Body mass index is 16.64 kg/m.  Estimated Nutritional Needs:   Kcal:  2000-2000  Protein:  >/= 90 grams  Fluid:  </= 1.2 L/day  EDUCATION NEEDS:   No education needs identified at this time  Jeb Levering Dietetic Intern Pager Number: [phone]

## 2016-10-10 NOTE — Progress Notes (Signed)
   Subjective: According to his wife patient was looking little better as compared to yesterday, less confused. He was feeling weak and lethargic. He did had dialysis done yesterday to decrease his calcium.  Objective:  Vital signs in last 24 hours: Vitals:   10/09/16 1736 10/09/16 2056 10/10/16 0618 10/10/16 0925  BP: (!) 140/59 (!) 142/75 (!) 145/70 (!) 145/64  Pulse: 88 88 92 96  Resp: (!) '22 20 18 16  '$ Temp: 98.2 F (36.8 C) 97.5 F (36.4 C) 97.6 F (36.4 C) 97.7 F (36.5 C)  TempSrc: Axillary Axillary Axillary Oral  SpO2: 98% 98% 97% 100%  Weight:  133 lb 2.5 oz (60.4 kg)    Height:       Gen. chronically ill-looking man, in no acute distress. Lungs. Clear bilaterally CV. Regular rate and rhythm. Abdomen. Drain in place and right upper quadrant with clean bandage. Diffusely tender abdomen, nondistended, no guarding or rebound, bowel sounds positive. Extremities. No edema, small cyanosis, pulses 2+ bilaterally.  CBC Latest Ref Rng & Units 10/10/2016 10/09/2016 10/09/2016  WBC 4.0 - 10.5 K/uL 9.5 - 12.6(H)  Hemoglobin 13.0 - 17.0 g/dL 8.5(L) 9.0(L) 6.6(LL)  Hematocrit 39.0 - 52.0 % 26.5(L) 27.4(L) 21.2(L)  Platelets 150 - 400 K/uL 73(L) - 84(L)   BMP Latest Ref Rng & Units 10/10/2016 10/09/2016 10/09/2016  Glucose 65 - 99 mg/dL 89 139(H) 123(H)  BUN 6 - 20 mg/dL 17 35(H) 31(H)  Creatinine 0.61 - 1.24 mg/dL 2.62(H) 5.42(H) 5.25(H)  Sodium 135 - 145 mmol/L 130(L) 129(L) 128(L)  Potassium 3.5 - 5.1 mmol/L 3.2(L) 5.1 4.9  Chloride 101 - 111 mmol/L 95(L) 96(L) 94(L)  CO2 22 - 32 mmol/L 26 20(L) 24  Calcium 8.9 - 10.3 mg/dL 10.1 12.5(H) 12.8(H)    Assessment/Plan:  70 year old male with past medical history of multiple myeloma on steroids, thrombocytopenia, end-stage renal disease on hemodialysis, acalculous cholecystitis status post ERCP with biliary tube and anemia who presents with weakness and confusion.  Altered mental status. Most probably due to hypercalcemia  secondary to multiple myeloma, and hyperparathyroidism due to end-stage renal disease. His corrected Koelsch and was 11.9 for today after dialysis yesterday. He was more alert and less confused according to his wife today. -Nephrology is following and they will continue calcitonin for another 24 hour. -Nephrology also prescribed pamidronate 60 mg IV,1 time dose. -He will get another dialysis tomorrow morning.  Possible pneumonia. He has worsening cough with sputum production. Although he is afebrile and no leukocytosis, he is immunocompromised because of multiple myeloma and chemotherapy. -Continue aztreonam for 5 days.  Anemia. His posttransfusion hemoglobin was 8.5 this morning. Goal is to keep his hemoglobin above 7. He is on Aranesp by nephrology.  Multiple myeloma. Oncology is following. Please stop chemotherapy because of progression of disease and unresponsiveness. They plan on palliative chemotherapy, but according to Dr.Gorsuch he might not be a good candidate for palliative chemotherapy either because of his disease progress. They advised to keep him on dexamethasone.  ESRD. On hemodialysis Monday visiting Friday. -Continue his scheduled dialysis.  Diabetes. His CBG remains below 100. -SSI  Thrombocytopenia. Most probably due to chemotherapy and multiple myeloma. Platelets were 73 today. Oncology advised to transfuse with platelets only if it drops below 10,000 or he is actively bleeding. -Monitor CBC.  Dispo: Anticipated discharge in approximately 2-3 day(s).   Lorella Nimrod, MD 10/10/2016, 3:56 PM Pager: [phone]

## 2016-10-10 NOTE — Progress Notes (Signed)
  Date: 10/10/2016  Patient name: Thomas [name]  Medical record number: [id number]  Date of birth: [birth year]   I have seen and evaluated Thomas [name] and discussed their care with the Residency Team. Briefly, Thomas [name] is a 70yo man with likely end stage MM, ESRD, acalculous cholecystitis s/p biliary drain who presents with weakness and confusion.  He was found to have severe hypercalcemia and underwent HD to improve this and his confusion has improved.  He also had issues with confusion due to his pain medications and these were recently changed.  Further symptoms include poor PO intake, worsening cough over the last few days.  He denies, fever night sweats.     Vitals:   10/10/16 0618 10/10/16 0925  BP: (!) 145/70 (!) 145/64  Pulse: 92 96  Resp: 18 16  Temp: 97.6 F (36.4 C) 97.7 F (36.5 C)   Gen: Cachetic, lethargic, answers questions appropriately Eyes: Anicteric sclerae, EOMI CV: RR, NR, no murmur Pulm: Decreased breath sounds at bases, no wheezing Abd: Scaphoid, biliary drain in place in RUQ Ext: Thin, no edema  CXR with possible LLL atelectasis vs. PNA CT chest with pleural effusions and atelectasis  Assessment and Plan: I have seen and evaluated the patient as outlined above. I agree with the formulated Assessment and Plan as detailed in the residents' note, with the following changes:   1. Confusion, severe hypercalcemia - Corrected on admission was around 14 - Ionized pending - Confusion improved with HD - Nephrology recommended 1 X dose of pamindronate - Received calcitonin - Possible other factors include infection, medication effect, nutrition, severe anemia, likely related to MM - Nutrition consult  2. Severe anemia, likely related to MM - PRBC X 2, follow up Hgb improved - Oncology following - ESA with HD  3. MM - Continue dexamethasone - Oncology following, follow recommendations  4. Worsening cough, reported sputum production, leukocytosis (12.6 on  admission) - Continue aztreonam, d/c vancomycin - Monitor for changes, at higher risk for pneumonia given chemotherapy earlier this month and continued steroid use) - Start mucinex  5. Thrombocytopenia - Monitor, transfuse if < 10 or bleeding  6. ESRD - Cr worsened, getting HD - Nephrology consult, recommendations reviewed  Please see Dr. Latina [name] daily note for further details.   Sid Falcon, MD 10/31/20171:21 PM

## 2016-10-11 ENCOUNTER — Ambulatory Visit: Payer: Self-pay | Admitting: *Deleted

## 2016-10-11 DIAGNOSIS — M8448XA Pathological fracture, other site, initial encounter for fracture: Secondary | ICD-10-CM

## 2016-10-11 DIAGNOSIS — Y95 Nosocomial condition: Secondary | ICD-10-CM

## 2016-10-11 DIAGNOSIS — N179 Acute kidney failure, unspecified: Secondary | ICD-10-CM

## 2016-10-11 LAB — RENAL FUNCTION PANEL
ANION GAP: 10 (ref 5–15)
Albumin: 1.6 g/dL — ABNORMAL LOW (ref 3.5–5.0)
BUN: 27 mg/dL — ABNORMAL HIGH (ref 6–20)
CALCIUM: 11.4 mg/dL — AB (ref 8.9–10.3)
CO2: 23 mmol/L (ref 22–32)
CREATININE: 3.62 mg/dL — AB (ref 0.61–1.24)
Chloride: 96 mmol/L — ABNORMAL LOW (ref 101–111)
GFR, EST AFRICAN AMERICAN: 18 mL/min — AB (ref >60)
GFR, EST NON AFRICAN AMERICAN: 16 mL/min — AB (ref >60)
Glucose, Bld: 117 mg/dL — ABNORMAL HIGH (ref 65–99)
PHOSPHORUS: 5.5 mg/dL — AB (ref 2.5–4.6)
Potassium: 3.5 mmol/L (ref 3.5–5.1)
SODIUM: 129 mmol/L — AB (ref 135–145)

## 2016-10-11 LAB — GLUCOSE, CAPILLARY
GLUCOSE-CAPILLARY: 121 mg/dL — AB (ref 65–99)
GLUCOSE-CAPILLARY: 124 mg/dL — AB (ref 65–99)
GLUCOSE-CAPILLARY: 126 mg/dL — AB (ref 65–99)
GLUCOSE-CAPILLARY: 124 mg/dL — AB (ref 65–99)

## 2016-10-11 LAB — CBC
HCT: 26.4 % — ABNORMAL LOW (ref 39.0–52.0)
HEMOGLOBIN: 8.6 g/dL — AB (ref 13.0–17.0)
MCH: 28.4 pg (ref 26.0–34.0)
MCHC: 32.6 g/dL (ref 30.0–36.0)
MCV: 87.1 fL (ref 78.0–100.0)
PLATELETS: 72 10*3/uL — AB (ref 150–400)
RBC: 3.03 MIL/uL — AB (ref 4.22–5.81)
RDW: 15.9 % — ABNORMAL HIGH (ref 11.5–15.5)
WBC: 9.2 10*3/uL (ref 4.0–10.5)

## 2016-10-11 LAB — CALCIUM, IONIZED: Calcium, Ionized, Serum: 6.3 mg/dL — ABNORMAL HIGH (ref 4.5–5.6)

## 2016-10-11 MED ORDER — DARBEPOETIN ALFA 200 MCG/0.4ML IJ SOSY
PREFILLED_SYRINGE | INTRAMUSCULAR | Status: AC
  Filled 2016-10-11: qty 0.4

## 2016-10-11 MED ORDER — LEVOFLOXACIN IN D5W 750 MG/150ML IV SOLN: 750.0000 mg | INTRAVENOUS | Status: DC

## 2016-10-11 MED ORDER — TRAMADOL HCL 50 MG PO TABS
50.0000 mg | ORAL_TABLET | Freq: Four times a day (QID) | ORAL | Status: DC | PRN
  Administered 2016-10-12 – 2016-10-13 (×2): 50 mg via ORAL
  Filled 2016-10-11 (×4): qty 1

## 2016-10-11 MED ORDER — LEVOFLOXACIN IN D5W 500 MG/100ML IV SOLN: 500.0000 mg | INTRAVENOUS | Status: DC

## 2016-10-11 MED ORDER — LEVOFLOXACIN IN D5W 750 MG/150ML IV SOLN
750.0000 mg | Freq: Once | INTRAVENOUS | Status: AC
  Administered 2016-10-11: 750 mg via INTRAVENOUS
  Filled 2016-10-11: qty 150

## 2016-10-11 NOTE — Progress Notes (Addendum)
  Date: 10/11/2016  Patient name: Thomas [name]  Medical record number: [id number]  Date of birth: 11-12-1946   This patient's plan of care was discussed with the house staff. Please see their note for complete details. I concur with their findings. Pt awakes and interacts. C/o back pain.   Vitals:   10/11/16 0620 10/11/16 0917  BP: (!) 154/65 139/78  Pulse: 98 (!) 102  Resp: 17 18  Temp: 97.9 F (36.6 C) 98.7 F (37.1 C)   CV: tachycardic Chest- decreased breath sounds Abd- bs+, soft non-tender.  Ext- no edema  Labs of note Cr 3.62 Ca 11.4  Alb 1.6 H/h 8.6/26.4  A/p Hypercalcemia Improving appreciate HD eval  Confusion Improving per prev notes and team assessment.   Renal Failure Will get HD today  pathologic fractures of spine Pain control with caution due to hd  Multiple Myeloma He will get f/u by his heme doctor today This is my first eval of him but I am very concerned about his long (and short) term prognosis. I would consider palliative care eval but will defer this to his heme MD for now.   Campbell Riches, MD 10/11/2016, 11:52 AM

## 2016-10-11 NOTE — Progress Notes (Signed)
Thomas [name]   DOB:27-Jan-1946   IF#:[id number]    Subjective: The patient is seen in the hemodialysis unit. I have a separate discussion in the patient's room along with a phone conversation with his son. The patient is very weak and intermittently falling asleep during hemodialysis. He denies pain  Objective:  Vitals:   10/11/16 1630 10/11/16 1700  BP: 126/77 124/75  Pulse: 98 96  Resp: (!) 24 20  Temp:       Intake/Output Summary (Last 24 hours) at 10/11/16 1712 Last data filed at 10/11/16 1400  Gross per 24 hour  Intake              110 ml  Output              550 ml  Net             -440 ml    GENERAL:alert, no distress and comfortable SKIN: Diffuse bruises EYES: normal, Conjunctiva are pink and non-injected, sclera clear Musculoskeletal:no cyanosis of digits and no clubbing  NEURO: alert & oriented x 3 with fluent speech, no focal motor/sensory deficits   Labs:  Lab Results  Component Value Date   WBC 9.2 10/11/2016   HGB 8.6 (L) 10/11/2016   HCT 26.4 (L) 10/11/2016   MCV 87.1 10/11/2016   PLT 72 (L) 10/11/2016   NEUTROABS 11.1 (H) 10/09/2016    Lab Results  Component Value Date   NA 129 (L) 10/11/2016   K 3.5 10/11/2016   CL 96 (L) 10/11/2016   CO2 23 10/11/2016    Assessment & Plan:   Multiple myeloma not having achieved remission ([place]) Unfortunately, the patient have evidence of disease progression. I suspect is due to frequent interruption of his treatment given his other major comorbidities. The patient has significant decline in overall performance status with recurrent infection and currently is oliguric. He is now readmitted again for possible pneumonia/sepsis I told the patient his prognosis is very poor and it is not clear to me that he will improve to the point that he could resume palliative treatment For now, I recommend supportive care alone and continue on low-dose dexamethasone daily I plan to return tomorrow when the patient may be more  alert without hemodialysis for further discussions about goals of care  Antineoplastic chemotherapy induced pancytopenia ([place]) The cause of the pancytopenia is multifactorial. His severe anemia is likely due to progression of disease on background history of end-stage renal dialysis I recommendblood transfusion to keep hemoglobin greater than 7 No need platelet transfusion unless bleeding less than 10,000  Acalculous cholecystitis He has recent ERCP. Hopefully, the biliary drainage tube can be removed soon  Closed fracture of shaft of left femur ([place]) Continue high-dose vitamin D. He will continue physical therapy as tolerated  Cancer associated pain  I recommend IV pain medicine as tolerated  ESRD (end stage renal disease) on dialysis [facility]) He is receiving hemodialysis in the outpatient on Mondays, Wednesdays and Fridays  Vitamin B12 deficiency He will continue vitamin B-12 supplement. His serum vitamin B-12 level is ok  Recent diarrhea, improved Diarrhea has improved since recent ERCP. Continue close observation. He has Imodium to take as needed  Confusion Likely due to hypercalcemia and infection Continue hydration as tolerated  Malignant hypercalcemia Continue high-dose steroids along with hemodialysis This will unlikely get better without chemotherapy but the patient is currently not a chemotherapy candidate  Pneumonia on CT He is placed on broad-spectrum IV antibiotics  Protein-calorie malnutrition, severe ([place])  I continue to encourage him to increase oral intake as tolerated  Goals of care, counseling/discussion I have a long discussion with the patient and family members. We discussed goals of care. His overall prognosis is poor due to multiple comorbidities and anticipated long-term hemodialysis. The patient is prone to recurrent infection and severe pancytopenia. We discussed advanced directives. He appointed his wife as his MPOA. Will return  tomorrow at around 1 pm  All questions were answered. The patient knows to call the clinic with any problems, questions or concerns.   Heath Lark, MD 10/11/2016  5:12 PM

## 2016-10-11 NOTE — Progress Notes (Signed)
Pharmacy Antibiotic Note  Thomas [name] is a 69 y.o. male admitted on 10/09/2016 with fatigue and weakness. Pt has multiple myeloma and is ESRD on HD-MWF. His last HD session was 10/30 (4 hour BFR 350). Day 3 of antibiotics for HCAP. He is being transitioned from aztreonam to levaquin. Cx are NGTD. Currently afebrile, WBC 9.2, LA wnl.   Plan: - Levaquin 750 mg x1 today after HD - Levaquin 500 mg every 48 hours - Monitor HD schedule and tolerance - Monitor cultures  Height: _0  (190.5 cm) Weight: 133 lb 2.5 oz (60.4 kg) IBW/kg (Calculated) : 84.5  Temp (24hrs), Avg:97.9 F (36.6 C), Min:97.4 F (36.3 C), Max:98.7 F (37.1 C)   Recent Labs Lab 10/09/16 0712 10/09/16 0724 10/09/16 1138 10/10/16 0519 10/10/16 0523 10/11/16 0547  WBC 12.8*  --  12.6*  --  9.5 9.2  CREATININE 5.25*  --  5.42* 2.62*  --  3.62*  LATICACIDVEN  --  0.74  --   --   --   --     Estimated Creatinine Clearance: 16.7 mL/min (by C-G formula based on SCr of 3.62 mg/dL (H)).    Allergies  Allergen Reactions  . Penicillins Rash and Other (See Comments)    Has patient had a PCN reaction causing immediate rash, facial/tongue/throat swelling, SOB or lightheadedness with hypotension: YES + Reaction causing SEVERE RASH involving MUCUS MEMBRANES or SKIN NECROSIS >> YES Reaction that required hospitalization: NO Reaction occurring within the last 10 years: NO If all of the above answers are "NO", then may proceed with Cephalosporin use.   . Dilaudid [Hydromorphone Hcl] Other (See Comments)    hallucinations  . Fish Allergy Nausea And Vomiting  . Codeine Other (See Comments)    "MAKES ME JUMPY"    Antimicrobials this admission: Vanc 10/30>>10/30 Aztreonam 10/30>>10/31 Levaquin 11/1>>  Dose adjustments this admission: NA  Microbiology results: 10/30 blood cx: NGTD x1 day  Thank you for allowing pharmacy to be a part of this patient's care.  Dierdre Harness, Cain Sieve, PharmD Clinical Pharmacy  Resident [phone] (Pager) 10/11/2016 11:56 AM

## 2016-10-11 NOTE — Progress Notes (Addendum)
[facility] Progress Note  Dialysis Orders:  MWF AF - 4 hr 180 400/800 EDW 62 3 K 2.25 Ca right IJ and left lower AVF maturing Heparin 1900 Aranesp 100 on 10/18 and 200 on 10/25- no VDRA Recent labs: hgb 8.7 10/5 down to 7.6 10/16 and 7.2 10/25 Ca 13 up to 14.4 corrected 10/25 - not called to anyone P 8.7 iPTH 8  Assessment/Plan: 1. PNA - per primary cont aztreonam - blood/sputum cx ngtd 2. MM - per primary onc following  3. ESRD - MWF cont on schedule -  K 3.5- HD with 4K bath today  3. Anemia - hgb 8.6 post transfusion (2 units 10/30) -  Aranesp 200 with HD today 4. Hypercalcemia/ sec HPTH -  Corr Ca 13.32 < 14.8 -  Cont calcitonin today -  no VDRA/ no binder  The hypercalcemia is from multiple myeloma progression.  Treating with bisphophonates/ calcitonin and low Ca bath.   5. HTN/volume - BP^ holding BP meds -  titrate edw down  Post HD wt 10/30 60.8kg net UF 1.5L  - Plan HD today UF goal 1L 6. Nutrition - severe PCM - poor PO intake - liberal diet/protein supp  7. AMS - Improving- Unclear of baseline - Head CT no acute findings -presumed hypercalcemia contributing to confusion 8. Thrombocytopenia - chronic   [name] PA-C [facility] Pager [phone] 10/11/2016,11:57 AM  LOS: 2 days   Pt seen, examined, agree w assess/plan as above with additions as indicated.  [name] MD [facility] pager [phone]    cell [phone] 10/11/2016, 1:52 PM      Subjective:  No c/os. Wife at bedside eating small amounts, confusion improving day by day   Objective Vitals:   10/10/16 1745 10/10/16 2300 10/11/16 0620 10/11/16 0917  BP: (!) 148/67 (!) 143/65 (!) 154/65 139/78  Pulse: 97 93 98 (!) 102  Resp: 17 17 17 18   Temp: 97.6 F (36.4 C) 97.4 F (36.3 C) 97.9 F (36.6 C) 98.7 F (37.1 C)  TempSrc: Oral Oral Oral Oral  SpO2: 97% 97% 96% 98%  Weight:      Height:       Physical Exam General: Frail, chronically ill  appearing elderly WM  Heart: RRR  Lungs: diminished breath sounds bilat - breathing unlabored  Abdomen: NT/ND Extremities: no LE edema  Dialysis Access: R IJ TDC/ L AVF maturing   Additional Objective Labs: Basic Metabolic Panel:  Recent Labs Lab 10/09/16 1138 10/10/16 0519 10/11/16 0547  NA 129* 130* 129*  K 5.1 3.2* 3.5  CL 96* 95* 96*  CO2 20* 26 23  GLUCOSE 139* 89 117*  BUN 35* 17 27*  CREATININE 5.42* 2.62* 3.62*  CALCIUM 12.5* 10.1 11.4*  PHOS  --  4.1 5.5*   Liver Function Tests:  Recent Labs Lab 10/09/16 0712 10/09/16 1138 10/10/16 0519 10/11/16 0547  AST 21 20  --   --   ALT 11* 10*  --   --   ALKPHOS 83 82  --   --   BILITOT 0.5 0.7  --   --   PROT RESULTS UNAVAILABLE DUE TO INTERFERING SUBSTANCE 10.2*  --   --   ALBUMIN 1.7* 1.7* 1.7* 1.6*   No results for input(s): LIPASE, AMYLASE in the last 168 hours. CBC:  Recent Labs Lab 10/09/16 0712 10/09/16 1138 10/09/16 2024 10/10/16 0523 10/11/16 0547  WBC 12.8* 12.6*  --  9.5 9.2  NEUTROABS 11.1*  --   --   --   --  HGB 6.9* 6.6* 9.0* 8.5* 8.6*  HCT 21.6* 21.2* 27.4* 26.5* 26.4*  MCV 89.3 90.2  --  88.6 87.1  PLT 96* 84*  --  73* 72*   Blood Culture    Component Value Date/Time   SDES BLOOD RIGHT ARM 10/09/2016 0910   SPECREQUEST BOTTLES DRAWN AEROBIC AND ANAEROBIC  5CC 10/09/2016 0910   CULT NO GROWTH 1 DAY 10/09/2016 0910   REPTSTATUS PENDING 10/09/2016 0910    Cardiac Enzymes:  Recent Labs Lab 10/09/16 0712  TROPONINI 0.03*   CBG:  Recent Labs Lab 10/10/16 1141 10/10/16 1631 10/10/16 2236 10/11/16 0758 10/11/16 1135  GLUCAP 99 129* 111* 121* 124*   Iron Studies: No results for input(s): IRON, TIBC, TRANSFERRIN, FERRITIN in the last 72 hours. Lab Results  Component Value Date   INR 1.30 10/09/2016   INR 1.55 10/09/2016   INR 1.65 07/17/2016   Medications: . sodium chloride     . acyclovir  200 mg Oral BID  . darbepoetin (ARANESP) injection - DIALYSIS  200 mcg  Intravenous Q Wed-HD  . dexamethasone  4 mg Oral Daily  . heparin  5,000 Units Subcutaneous Q8H  . insulin aspart  0-20 Units Subcutaneous TID WC

## 2016-10-11 NOTE — Progress Notes (Signed)
   Subjective: According to his wife patient was slightly more oriented today, he did eat half a bowel of cereal today. He was complaining of back pain and some leg cramping.  Objective:  Vital signs in last 24 hours: Vitals:   10/11/16 0620 10/11/16 0917 10/11/16 1356 10/11/16 1400  BP: (!) 154/65 139/78 (!) 159/77 (!) 153/80  Pulse: 98 (!) 102 100 99  Resp: 17 18 (!) 22 (!) 22  Temp: 97.9 F (36.6 C) 98.7 F (37.1 C) 97.5 F (36.4 C)   TempSrc: Oral Oral Oral   SpO2: 96% 98% 96%   Weight:   130 lb 8.2 oz (59.2 kg)   Height:       Gen. Thomas [name] , chronically ill-looking man, in no distress. Lungs. Clear bilaterally CV. Regular rate and rhythm. Abdomen. Drain in place and right upper quadrant, nontender, nondistended, bowel sounds positive. Extremities. No edema, small cyanosis, pulses 2+ bilaterally.  Labs. CBC Latest Ref Rng & Units 10/11/2016 10/10/2016 10/09/2016  WBC 4.0 - 10.5 K/uL 9.2 9.5 -  Hemoglobin 13.0 - 17.0 g/dL 8.6(L) 8.5(L) 9.0(L)  Hematocrit 39.0 - 52.0 % 26.4(L) 26.5(L) 27.4(L)  Platelets 150 - 400 K/uL 72(L) 73(L) -   BMP Latest Ref Rng & Units 10/11/2016 10/10/2016 10/09/2016  Glucose 65 - 99 mg/dL 117(H) 89 139(H)  BUN 6 - 20 mg/dL 27(H) 17 35(H)  Creatinine 0.61 - 1.24 mg/dL 3.62(H) 2.62(H) 5.42(H)  Sodium 135 - 145 mmol/L 129(L) 130(L) 129(L)  Potassium 3.5 - 5.1 mmol/L 3.5 3.2(L) 5.1  Chloride 101 - 111 mmol/L 96(L) 95(L) 96(L)  CO2 22 - 32 mmol/L 23 26 20(L)  Calcium 8.9 - 10.3 mg/dL 11.4(H) 10.1 12.5(H)   Phosphorous. 5.5 Albumin. 1.6 Corrected calcium. 13.32-13.64  Assessment/Plan:  70 year old male with past medical history of multiple myeloma on steroids, thrombocytopenia, end-stage renal disease on hemodialysis, acalculous cholecystitis status post ERCP with biliary tube and anemia who presents with weakness and confusion.  Altered mental status. Most probably due to metabolic encephalopathy because of hypercalcemia secondary to multiple  myeloma. His corrected calcium today was 13.32-13.64. He was going for an other dialysis today. Nephrology extended his calcitonin for another one day.  Possible HCAP. According to wife his cough is improving. He remains afebrile without any leukocytosis. -We will change his aztreonam to Levaquin today.  Anemia. His hemoglobin stays stable at 8.6 today. -Monitor his hemoglobin and transfuse if dropped below 7.  Multiple myeloma. Oncology is following. They just want to continue dexamethasone, no more chemotherapy because of progression of disease. Family is not ready for palliative care meeting. We will involve palliative care once they are ready for this talk.  ESRD. Continue Monday Wednesday and Friday dialysis.  Electrolyte abnormalities. Nephrology is following and they aren't adjusting his bath accordingly.  Thrombocytopenia. Platelets were 72 today. He will only require platelet transfusion if dropped below 10,000 or if he starts actively bleeding.  Dispo: Anticipated discharge in approximately 2-3 day(s).   Lorella Nimrod, MD 10/11/2016, 2:33 PM Pager: [phone]

## 2016-10-12 ENCOUNTER — Ambulatory Visit: Payer: PPO

## 2016-10-12 ENCOUNTER — Inpatient Hospital Stay (HOSPITAL_COMMUNITY): Payer: PPO

## 2016-10-12 DIAGNOSIS — Z515 Encounter for palliative care: Secondary | ICD-10-CM

## 2016-10-12 DIAGNOSIS — Z66 Do not resuscitate: Secondary | ICD-10-CM

## 2016-10-12 DIAGNOSIS — R0603 Acute respiratory distress: Secondary | ICD-10-CM

## 2016-10-12 DIAGNOSIS — R400 Somnolence: Secondary | ICD-10-CM

## 2016-10-12 DIAGNOSIS — R0600 Dyspnea, unspecified: Secondary | ICD-10-CM

## 2016-10-12 LAB — RENAL FUNCTION PANEL
ALBUMIN: 1.6 g/dL — AB (ref 3.5–5.0)
ANION GAP: 7 (ref 5–15)
BUN: 16 mg/dL (ref 6–20)
CALCIUM: 10.3 mg/dL (ref 8.9–10.3)
CO2: 26 mmol/L (ref 22–32)
CREATININE: 2.22 mg/dL — AB (ref 0.61–1.24)
Chloride: 95 mmol/L — ABNORMAL LOW (ref 101–111)
GFR, EST AFRICAN AMERICAN: 33 mL/min — AB (ref >60)
GFR, EST NON AFRICAN AMERICAN: 29 mL/min — AB (ref >60)
Glucose, Bld: 72 mg/dL (ref 65–99)
PHOSPHORUS: 3.1 mg/dL (ref 2.5–4.6)
Potassium: 3.7 mmol/L (ref 3.5–5.1)
SODIUM: 128 mmol/L — AB (ref 135–145)

## 2016-10-12 LAB — CBC
HCT: 27.2 % — ABNORMAL LOW (ref 39.0–52.0)
HEMOGLOBIN: 8.7 g/dL — AB (ref 13.0–17.0)
MCH: 28.2 pg (ref 26.0–34.0)
MCHC: 32 g/dL (ref 30.0–36.0)
MCV: 88 fL (ref 78.0–100.0)
PLATELETS: 68 10*3/uL — AB (ref 150–400)
RBC: 3.09 MIL/uL — AB (ref 4.22–5.81)
RDW: 15.7 % — ABNORMAL HIGH (ref 11.5–15.5)
WBC: 8.8 10*3/uL (ref 4.0–10.5)

## 2016-10-12 LAB — GLUCOSE, CAPILLARY
GLUCOSE-CAPILLARY: 70 mg/dL (ref 65–99)
Glucose-Capillary: 91 mg/dL (ref 65–99)

## 2016-10-12 MED ORDER — LORAZEPAM 1 MG PO TABS
1.0000 mg | ORAL_TABLET | ORAL | Status: DC | PRN
  Filled 2016-10-12: qty 1

## 2016-10-12 NOTE — Progress Notes (Deleted)
Pt being discharged home via wheelchair with family. Pt alert and oriented x4. VSS. Pt c/o no pain at this time. No signs of respiratory distress. Education complete and care plans resolved. IV removed with catheter intact and pt tolerated well. No further issues at this time. Pt to follow up with PCP. Tevita Gomer R, RN 

## 2016-10-12 NOTE — Progress Notes (Signed)
[name] Progress Note  Dialysis Orders:  MWF AF - 4 hr 180 400/800 EDW 62 3 K 2.25 Ca right IJ and left lower AVF maturing Heparin 1900 Aranesp 100 on 10/18 and 200 on 10/25- no VDRA Recent labs: hgb 8.7 10/5 down to 7.6 10/16 and 7.2 10/25 Ca 13 up to 14.4 corrected 10/25 - not called to anyone P 8.7 iPTH 8  Assessment/Plan: 1. PNA - per primary  On Levaquin - blood/sputum cx ngtd 2. MM - per primary onc following - discussing Thomas [name] w pt and family, poor prognosis 3. ESRD - MWF cont on schedule -  HD tomorrow K 3.7 but needs low Ca bath  3. Anemia - hgb 8.7 post transfusion (2 units 10/30) -  Aranesp 200 on 11/1 4. Hypercalcemia/ sec HPTH -  Corr Ca 12.22 < 13.32  The hypercalcemia is from multiple myeloma progression. Treating with bisphophonates/ calcitonin and low Ca bath.   5. HTN/volume - BP^ holding BP meds /hyponatremic titrate edw down  Post HD wt 57.7kg  net UF 1.5L  - Plan HD tomorrow UF goal 2-3L as tolerated  6. Nutrition - Alb 1.6 severe PCM - poor PO intake - liberal diet/protein supp  7. AMS - Improving- Unclear of baseline - Head CT no acute findings -presumed hypercalcemia contributing to confusion 8. Thrombocytopenia - chronic   Thomas Child PA-C [facility] Pager [phone] 10/12/2016,12:00 PM  LOS: 3 days   Pt seen, examined and agree w A/P as above.  Thomas Splinter MD [employer] pager [phone]   10/12/2016, 1:21 PM     Subjective:  HD yesterday. Felt "awful" Having a lot of pain today. More alert, less confused   Objective Vitals:   10/11/16 1700 10/11/16 1730 10/11/16 2123 10/12/16 1034  BP: 124/75 135/74 140/69 138/66  Pulse: 96 95 95 98  Resp: 20 (!) 21 16 16   Temp:  97.5 F (36.4 C) 97.6 F (36.4 C) 98 F (36.7 C)  TempSrc:  Oral Oral Axillary  SpO2: 100% 99% 98% 99%  Weight:  57.7 kg (127 lb 3.3 oz)    Height:       Physical Exam General: Frail, chronically ill appearing elderly WM   Heart: RRR  Lungs: diminished breath sounds bilat - breathing unlabored  Abdomen: NT/ND Extremities: no LE edema  Dialysis Access: R IJ TDC/ L AVF maturing   Additional Objective Labs: Basic Metabolic Panel:  Recent Labs Lab 10/10/16 0519 10/11/16 0547 10/12/16 0538  NA 130* 129* 128*  K 3.2* 3.5 3.7  CL 95* 96* 95*  CO2 26 23 26   GLUCOSE 89 117* 72  BUN 17 27* 16  CREATININE 2.62* 3.62* 2.22*  CALCIUM 10.1 11.4* 10.3  PHOS 4.1 5.5* 3.1   Liver Function Tests:  Recent Labs Lab 10/09/16 0712 10/09/16 1138 10/10/16 0519 10/11/16 0547 10/12/16 0538  AST 21 20  --   --   --   ALT 11* 10*  --   --   --   ALKPHOS 83 82  --   --   --   BILITOT 0.5 0.7  --   --   --   PROT RESULTS UNAVAILABLE DUE TO INTERFERING SUBSTANCE 10.2*  --   --   --   ALBUMIN 1.7* 1.7* 1.7* 1.6* 1.6*   No results for input(s): LIPASE, AMYLASE in the last 168 hours. CBC:  Recent Labs Lab 10/09/16 0712 10/09/16 1138  10/10/16 0523 10/11/16 0547 10/12/16 2111  WBC 12.8* 12.6*  --  9.5 9.2 8.8  NEUTROABS 11.1*  --   --   --   --   --   HGB 6.9* 6.6*  < > 8.5* 8.6* 8.7*  HCT 21.6* 21.2*  < > 26.5* 26.4* 27.2*  MCV 89.3 90.2  --  88.6 87.1 88.0  PLT 96* 84*  --  73* 72* 68*  < > = values in this interval not displayed. Blood Culture    Component Value Date/Time   SDES BLOOD RIGHT ARM 10/09/2016 0910   SPECREQUEST BOTTLES DRAWN AEROBIC AND ANAEROBIC  5CC 10/09/2016 0910   CULT NO GROWTH 3 DAYS 10/09/2016 0910   REPTSTATUS PENDING 10/09/2016 0910    Cardiac Enzymes:  Recent Labs Lab 10/09/16 0712  TROPONINI 0.03*   CBG:  Recent Labs Lab 10/11/16 1135 10/11/16 1805 10/11/16 2110 10/12/16 0757 10/12/16 1146  GLUCAP 124* 126* 124* 70 91   Iron Studies: No results for input(s): IRON, TIBC, TRANSFERRIN, FERRITIN in the last 72 hours. Lab Results  Component Value Date   INR 1.30 10/09/2016   INR 1.55 10/09/2016   INR 1.65 07/17/2016   Medications: . sodium chloride      . acyclovir  200 mg Oral BID  . darbepoetin (ARANESP) injection - DIALYSIS  200 mcg Intravenous Q Wed-HD  . dexamethasone  4 mg Oral Daily  . heparin  5,000 Units Subcutaneous Q8H  . insulin aspart  0-20 Units Subcutaneous TID WC  . [START ON 10/13/2016] levofloxacin (LEVAQUIN) IV  500 mg Intravenous Q48H

## 2016-10-12 NOTE — Progress Notes (Addendum)
  Date: 10/12/2016  Patient name: Thomas [name]  Medical record number: [id number]  Date of birth: 1946-09-17   This patient's plan of care was discussed with the house staff. Please see their note for complete details. I concur with their findings.  Pt continues to have back pain. He is not taking pain rx though.   Vitals:   10/11/16 2123 10/12/16 1034  BP: 140/69 138/66  Pulse: 95 98  Resp: 16 16  Temp: 97.6 F (36.4 C) 98 F (36.7 C)   CV- rrr Chest- cta abd- bs+, soft, non-tender.  MSE- he is awake and alert. He is more fatigued than yesterday.   Labs of note: Corr Ca 12.22 Cr 2.22 <--- 3.62 Plt 68  Alb 1.6  A/p Multiple Myeloma with pathologic fractures He is offered pain rx but does not want Will continue to do our best to keep him comfortable.  Appreciate h/o f/u.   hyperCalcemia Will continue HD, low Ca bath.  Appreciate renal f/u  Thombocytopenia No PLT transfusion unless bleeding, PLT < 10,000  Protein Calorie Malnutrition, severe Nutrition as he allows  Goals of Care Family meeting with palliative care today  Campbell Riches, MD 10/12/2016, 1:37 PM

## 2016-10-12 NOTE — Consult Note (Signed)
[facility] [employer] Received request from [place] for family interest in [facility]. Chart reviewed and spoke with spouse by phone. She is agreeable to [employer] transfer tomorrow. Plan is to meet her Friday morning to complete paper work. CSW Dominica aware.   Will need discharge summary faxed to [phone].  RN please call report to [phone] when ready for transfer.   Thank you,  Erling Conte, LCSW [phone]

## 2016-10-12 NOTE — Progress Notes (Signed)
   Subjective: Patient is still feeling back pain. He states that he had hemodialysis yesterday was really tough for him. He was feeling tired and fatigued. He has no appetite, but able to eat a few spoons of cerealThis morning and couple of spoons from his dinner last night.  Objective:  Vital signs in last 24 hours: Vitals:   10/11/16 1700 10/11/16 1730 10/11/16 2123 10/12/16 1034  BP: 124/75 135/74 140/69 138/66  Pulse: 96 95 95 98  Resp: 20 (!) _0 Temp:  97.5 F (36.4 C) 97.6 F (36.4 C) 98 F (36.7 C)  TempSrc:  Oral Oral Axillary  SpO2: 100% 99% 98% 99%  Weight:  127 lb 3.3 oz (57.7 kg)    Height:       Gen. Thomas [name] , chronically ill-looking man, alert and oriented, in no distress. Lungs.Clear bilaterally CV. Regular rate and rhythm. Abdomen. Soft, nontender, bowel sounds positive. Extremities. No edema, no cyanosis, pulses 2+ bilaterally.  Labs. CBC Latest Ref Rng & Units 10/12/2016 10/11/2016 10/10/2016  WBC 4.0 - 10.5 K/uL 8.8 9.2 9.5  Hemoglobin 13.0 - 17.0 g/dL 8.7(L) 8.6(L) 8.5(L)  Hematocrit 39.0 - 52.0 % 27.2(L) 26.4(L) 26.5(L)  Platelets 150 - 400 K/uL 68(L) 72(L) 73(L)   BMP Latest Ref Rng & Units 10/12/2016 10/11/2016 10/10/2016  Glucose 65 - 99 mg/dL 72 117(H) 89  BUN 6 - 20 mg/dL 16 27(H) 17  Creatinine 0.61 - 1.24 mg/dL 2.22(H) 3.62(H) 2.62(H)  Sodium 135 - 145 mmol/L 128(L) 129(L) 130(L)  Potassium 3.5 - 5.1 mmol/L 3.7 3.5 3.2(L)  Chloride 101 - 111 mmol/L 95(L) 96(L) 95(L)  CO2 22 - 32 mmol/L _1 Calcium 8.9 - 10.3 mg/dL 10.3 11.4(H) 10.1   Albumin. 1.6 Corrected calcium. 11.15  Assessment/Plan:  70 year old male with past medical history of multiple myeloma on steroids, thrombocytopenia, end-stage renal disease on hemodialysis, acalculous cholecystitis status post ERCP with biliary tube and anemia who presents with weakness and confusion.  Altered mental status/hypercalcemia . Most probably due to metabolic encephalopathy because of  hypercalcemia secondary to multiple myeloma. His corrected calcium today was 11.15 after dialysis yesterday. He was alert and oriented when seen today. He was able to participate in discussion with end-of-life goals and palliative management.  Possible HCAP. We will continue Levaquin for another 2-3 days.  Multiple myeloma.DR. Alvy Bimler had a long talk with patient and his family today, palliative care was present during the talk, she told the patient and his family that she cannot afford any more palliative chemotherapy because of his concurrent comorbidities. If we discontinue dialysis his life expectancy is less than a week. Patient seems understanding and told that he is 'ready to meet his Thomas [name]'. Family was very upset need some time to decide whether to continue dialysis or stop treatment and just go for comfort care at this point.  Anemia. His hemoglobin stays stable at 8.6 today. -Monitor his hemoglobin and transfuse if dropped below 7.  ESRD. Family is to decide whether they want to continue dialysis at this point are not.  Thrombocytopenia. Platelets were 68 today. He will only require platelet transfusion if dropped below 10,000 or if he starts actively bleeding.   Dispo: Anticipated discharge in approximately 1-2 day(s).   Lorella Nimrod, MD 10/12/2016, 2:37 PM Pager: [phone]

## 2016-10-12 NOTE — Progress Notes (Signed)
Parnell Spieler   DOB:[birth year]   XY#:[id number]    Subjective: He is weak. Had some bone pain recently, received some pain medications. Family members are present. He continues to have intermittent confusions  Objective:  Vitals:   10/11/16 2123 10/12/16 1034  BP: 140/69 138/66  Pulse: 95 98  Resp: 16 16  Temp: 97.6 F (36.4 C) 98 F (36.7 C)     Intake/Output Summary (Last 24 hours) at 10/12/16 1334 Last data filed at 10/12/16 6286  Gross per 24 hour  Intake              185 ml  Output             1712 ml  Net            -1527 ml    GENERAL:alert, no distress and comfortable SKIN: diffuse bruising Musculoskeletal:no cyanosis of digits and no clubbing  NEURO: alert & oriented x 3 with slow speech, no focal motor/sensory deficits   Labs:  Lab Results  Component Value Date   WBC 8.8 10/12/2016   HGB 8.7 (L) 10/12/2016   HCT 27.2 (L) 10/12/2016   MCV 88.0 10/12/2016   PLT 68 (L) 10/12/2016   NEUTROABS 11.1 (H) 10/09/2016    Lab Results  Component Value Date   NA 128 (L) 10/12/2016   K 3.7 10/12/2016   CL 95 (L) 10/12/2016   CO2 26 10/12/2016    Assessment & Plan:   Multiple myeloma not having achieved remission ([place]) with malignant hypercalcemia Unfortunately, the patient have evidence of disease progression. I suspect is due to frequent interruption of his treatment given his other major comorbidities. The patient has significant decline in overall performance status with recurrent infection and currently is oliguric. He is now readmitted again for possible pneumonia/sepsis I told the patient his prognosis is very poor and it is not clear to me that he will improve to the point that he could resume palliative treatment  Goals of care, counseling/discussion I have a long discussion with the patient and family members. We discussed goals of care. His overall prognosis is poor due to multiple comorbidities and anticipated long-term hemodialysis. The patient is  prone to recurrent infection and severe pancytopenia. We discussed advanced directives. He appointed his wife as his MPOA. I informed him that it is unlikely he can improve to the point of returning to outpatient clinic for further chemotherapy His malignant hypercalcemia is unlikely going to improve without treatment and he is too weak to receive further palliative chemotherapy We discussed prognosis. If we stop hemodialysis, he would unlikely to survive past 1 week After much discussion, the patient finally accepted his poor prognosis and inability to receive further palliative treatment He has indicated, "he is ready to meet the Lord". At this juncture, Dr. Reesa Chew and Wadie Lessen from palliative care took over the discussion related to plan to transition his care towards hospice  Will sign off. I addressed all his questions and concerns   Heath Lark, MD 10/12/2016  1:34 PM

## 2016-10-12 NOTE — Consult Note (Signed)
Consultation Note Date: 10/12/2016   Patient Name: Thomas [name]  DOB: 1946/06/26  MRN: [id number]  Age / Sex: 70 y.o., male  PCP: No Pcp Per Patient Referring Physician: Sid Falcon, MD  Reason for Consultation: Establishing goals of care and Psychosocial/spiritual support  HPI/Patient Profile:    70 y.o. male  admitted on 10/09/2016 past medical history of multiple myeloma on steroids, thrombocytopenia, end-stage renal disease on hemodialysis, acalculous cholecystitis status post ERCP with biliary tube and anemia who presents with weakness and confusion.  Today Dr Alvy Bimler meet with family and explained the poor prognosis and limitations of chemotherapy 2/2 to multiple commorbidities and poor functional status.  Discussed limited role of dialysis in this situation.    Patient expressed his appreciation of Dr Alvy Bimler and his "knowing' that he 'Is dying and ready to meet the Lord".  All family at bedside expressing love and support of Thomas [name], all visibly tearful  expressing their feelings. Family were able to share happy memories with each other.  Clinical Assessment and Goals of Care:  This NP Wadie Lessen reviewed medical records, received report from team, assessed the patient and then meet at the patient's bedside along with his wife and two daughter, Dr Alvy Bimler and Dr Reesa Chew to discuss diagnosis, prognosis, [place], EOL wishes disposition and options.    Concepts specific to code status, artifical feeding and hydration, continued IV antibiotics and rehospitalization was had.  The difference between a aggressive medical intervention path  and a palliative comfort care path for this patient at this time was had.  Values and goals of care important to patient and family were attempted to be elicited.  Concept of Hospice was discussed, family has past experiences with hospice  Natural trajectory and  expectations at EOL were discussed.  Questions and concerns addressed.   Family encouraged to call with questions or concerns.  PMT will continue to support holistically.   SUMMARY OF RECOMMENDATIONS   -patient with the support of his family has made decision to shift to a full comfort path and "put it in the lord's hands"  -family is hopeful for a hospice facility, they mention [facility]  -patient hopes for some quality time with his family    Code Status/Advance Care Planning:  DNR--documented today   Symptom Management:   Pain: Tramadal 50 mg po as previously utilized at home with good results  Make adjustment,ents as symptoms manafest  Palliative Prophylaxis:   Aspiration, Bowel Regimen, Frequent Pain Assessment, Oral Care and Palliative Wound Care  Additional Recommendations (Limitations, Scope, Preferences):  Full Comfort Care   Minimize medications, no further diagnostics, no dialysis, no further life prolonging intervetnions  Psycho-social/Spiritual:   Desire for further Chaplaincy support:no-strong community church support  Additional Recommendations: Education on Hospice and Grief/Bereavement Support  Prognosis:   < 2 weeks, will write for hospice choice  Discharge Planning: Hospice facility      Primary Diagnoses: Present on Admission: . Anemia due to bone marrow failure ([place]) . Bilateral low back pain without  sciatica . Antineoplastic chemotherapy induced pancytopenia ([place]) . Acute respiratory failure with hypoxia ([place]) . Acalculous cholecystitis . Cancer associated pain . Multiple myeloma not having achieved remission ([place])   I have reviewed the medical record, interviewed the patient and family, and examined the patient. The following aspects are pertinent.  Past Medical History:  Diagnosis Date  . Anemia    ANEMIA OF RENAL DISEASE  . Arthritis   . Chronic kidney disease   . Compression fracture   . Dialysis patient ([place])   .  Elevated LFTs 07/2016  . Gall stones   . GERD (gastroesophageal reflux disease)   . Hypotension   . Multiple myeloma ([place])   . Pneumonia   . Thrombocytopenia ([place]) 07/2016   Social History   Social History  . Marital status: Married    Spouse name: N/A  . Number of children: N/A  . Years of education: N/A   Social History Main Topics  . Smoking status: Former Research scientist (life sciences)  . Smokeless tobacco: Never Used  . Alcohol use No  . Drug use: No  . Sexual activity: Not Asked   Other Topics Concern  . None   Social History Narrative  . None   Family History  Problem Relation Age of Onset  . Diabetes Father    Scheduled Meds: . acyclovir  200 mg Oral BID  . darbepoetin (ARANESP) injection - DIALYSIS  200 mcg Intravenous Q Wed-HD  . dexamethasone  4 mg Oral Daily  . heparin  5,000 Units Subcutaneous Q8H  . insulin aspart  0-20 Units Subcutaneous TID WC  . [START ON 10/13/2016] levofloxacin (LEVAQUIN) IV  500 mg Intravenous Q48H   Continuous Infusions: . sodium chloride     PRN Meds:.traMADol Medications Prior to Admission:  Prior to Admission medications   Medication Sig Start Date End Date Taking? Authorizing Provider  acetaminophen (TYLENOL) 325 MG tablet Take 2 tablets (650 mg total) by mouth every 6 (six) hours as needed for mild pain (or Fever >/= 101). 05/29/16  Yes Nishant Dhungel, MD  acyclovir (ZOVIRAX) 200 MG capsule Take 1 capsule (200 mg total) by mouth 2 (two) times daily. 07/03/16  Yes Heath Lark, MD  ALPRAZolam (XANAX) 0.25 MG tablet Take 1 tablet (0.25 mg total) by mouth 2 (two) times daily as needed for anxiety. 08/02/16  Yes Thurnell Lose, MD  calcium acetate (PHOSLO) 667 MG capsule Take 1 capsule (667 mg total) by mouth 2 (two) times daily with a meal. 06/08/16  Yes Lavon Paganini Angiulli, PA-C  Cholecalciferol (VITAMIN D3) 50000 units CAPS Take 1 capsule by mouth once a week.   Yes Historical Provider, MD  cyanocobalamin 1000 MCG tablet Take 1 tablet (1,000 mcg total)  by mouth daily. 06/08/16  Yes Daniel J Angiulli, PA-C  dexamethasone (DECADRON) 2 MG tablet Take 4 mg by mouth daily. On Sunday, before first chemo treatment.   Yes Historical Provider, MD  diphenoxylate-atropine (LOMOTIL) 2.5-0.025 MG tablet Take 1 tablet by mouth 4 (four) times daily as needed for diarrhea or loose stools.   Yes Historical Provider, MD  insulin aspart (NOVOLOG) 100 UNIT/ML injection Before each meal 3 times a day, 140-199 - 2 units, 200-250 - 4 units, 251-299 - 6 units,  300-349 - 8 units,  350 or above 10 units. Dispense syringes and needles as needed, Ok to switch to PEN if approved. Substitute to any brand approved. DX DM2, Code E11.65 08/02/16  Yes Thurnell Lose, MD  loratadine (CLARITIN) 10 MG tablet  Take 10 mg by mouth daily.    Yes Historical Provider, MD  methocarbamol (ROBAXIN) 500 MG tablet Take 500 mg by mouth every 6 (six) hours as needed for muscle spasms. Takes a half tablet as needed   Yes Historical Provider, MD  midodrine (PROAMATINE) 10 MG tablet Take 1 tablet (10 mg total) by mouth 3 (three) times daily with meals. 09/25/16  Yes Heath Lark, MD  multivitamin (RENA-VIT) TABS tablet Take 1 tablet by mouth at bedtime. 06/08/16  Yes Daniel J Angiulli, PA-C  ondansetron (ZOFRAN) 8 MG tablet Take 1 tablet (8 mg total) by mouth every 8 (eight) hours as needed for nausea. 07/03/16  Yes Heath Lark, MD  oxyCODONE (OXY IR/ROXICODONE) 5 MG immediate release tablet Take 1 tablet (5 mg total) by mouth every 4 (four) hours as needed for moderate pain. Patient taking differently: Take 2.5-10 mg by mouth every 4 (four) hours as needed for moderate pain.  08/02/16  Yes Thurnell Lose, MD  pantoprazole (PROTONIX) 40 MG tablet Take 1 tablet (40 mg total) by mouth daily. 06/08/16  Yes Daniel J Angiulli, PA-C  polyethylene glycol (MIRALAX / GLYCOLAX) packet Take 17 g by mouth daily as needed for mild constipation or moderate constipation. 08/02/16  Yes Thurnell Lose, MD  protein  supplement (RESOURCE BENEPROTEIN) POWD Take 6 g by mouth 3 (three) times daily with meals. 08/02/16  Yes Thurnell Lose, MD  saccharomyces boulardii (FLORASTOR) 250 MG capsule Take 1 capsule (250 mg total) by mouth 2 (two) times daily. 09/03/16  Yes Costin Karlyne Greenspan, MD  traMADol (ULTRAM) 50 MG tablet Take 1 tablet (50 mg total) by mouth every 6 (six) hours as needed. 10/06/16  Yes Heath Lark, MD  glucose blood test strip 1 each by Other route 3 (three) times daily. Use as instructed 10/02/16   Heath Lark, MD  Lancets MISC 1 each by Does not apply route 3 (three) times daily. 10/05/16   Heath Lark, MD  triamcinolone (NASACORT AQ) 55 MCG/ACT AERO nasal inhaler Place 2 sprays into the nose daily. 07/03/16   Heath Lark, MD   Allergies  Allergen Reactions  . Penicillins Rash and Other (See Comments)    Has patient had a PCN reaction causing immediate rash, facial/tongue/throat swelling, SOB or lightheadedness with hypotension: YES + Reaction causing SEVERE RASH involving MUCUS MEMBRANES or SKIN NECROSIS >> YES Reaction that required hospitalization: NO Reaction occurring within the last 10 years: NO If all of the above answers are "NO", then may proceed with Cephalosporin use.   . Dilaudid [Hydromorphone Hcl] Other (See Comments)    hallucinations  . Fish Allergy Nausea And Vomiting  . Codeine Other (See Comments)    "MAKES ME JUMPY"   Review of Systems  Constitutional: Positive for fatigue.  Neurological: Positive for weakness.    Physical Exam  Constitutional: He appears cachectic. He appears ill.  Cardiovascular: Tachycardia present.   Pulmonary/Chest: He has decreased breath sounds in the right lower field and the left lower field.  Musculoskeletal:  -generalized weakness and atrophy  Neurological: He is alert.  Skin: Skin is warm and dry.    Vital Signs: BP 140/69 (BP Location: Right Arm)   Pulse 95   Temp 97.6 F (36.4 C) (Oral)   Resp 16   Ht 6' 3"  (1.905 m)   Wt 57.7 kg  (127 lb 3.3 oz)   SpO2 98%   BMI 15.90 kg/m  Pain Assessment: 0-10   Pain Score: 2  SpO2: SpO2: 98 % O2 Device:SpO2: 98 % O2 Flow Rate: .O2 Flow Rate (L/min): 1 L/min  IO: Intake/output summary:  Intake/Output Summary (Last 24 hours) at 10/12/16 1033 Last data filed at 10/12/16 1164  Gross per 24 hour  Intake              185 ml  Output             1712 ml  Net            -1527 ml    LBM: Last BM Date: 10/11/16 Baseline Weight: Weight: 61.2 kg (135 lb) Most recent weight: Weight: 57.7 kg (127 lb 3.3 oz)      Palliative Assessment/Data: 30 %    Discussed with Dr Alvy Bimler and Dr Reesa Chew  Time In: 1515 Time Out: 1630 Time Total: 75 min Greater than 50%  of this time was spent counseling and coordinating care related to the above assessment and plan.  Signed by: Wadie Lessen, NP   Please contact Palliative Medicine Team phone at [phone] for questions and concerns.  For individual provider: See Shea Evans

## 2016-10-13 LAB — TYPE AND SCREEN
ABO/RH(D): A POS
Antibody Screen: NEGATIVE
UNIT DIVISION: 0
Unit division: 0
Unit division: 0

## 2016-10-13 MED ORDER — LORAZEPAM 1 MG PO TABS: 1.0000 mg | ORAL_TABLET | ORAL | 0 refills | Status: AC | PRN

## 2016-10-13 MED ORDER — DM-GUAIFENESIN ER 30-600 MG PO TB12: 1.0000 | ORAL_TABLET | Freq: Two times a day (BID) | ORAL | Status: DC

## 2016-10-13 MED ORDER — DM-GUAIFENESIN ER 30-600 MG PO TB12: 1.0000 | ORAL_TABLET | Freq: Two times a day (BID) | ORAL | Status: AC | PRN

## 2016-10-13 NOTE — Clinical Social Work Note (Signed)
Patient will discharge today to [facility], transported by ambulance. Transport contacted and discharge clinicals transmitted to facility by Harmon Pier with [employer]. Family advised that transport called by bedside nurse.  Emmalin Jaquess Givens, MSW, LCSW Licensed Clinical Social Worker [place] [phone]

## 2016-10-13 NOTE — Consult Note (Signed)
   [facility] CM Inpatient Consult   10/13/2016  Thomas [name] [birth year] [id number]  Chart review reveals the patient is likely to discharge to [facility] and will have Hospice services.  Patient will have full care management services through his Hospice benefits and will no longer be followed by [facility].   For questions, please contact:  Natividad Brood, RN BSN [facility] Liaison  [phone] business mobile phone Toll free office [phone]

## 2016-10-13 NOTE — Progress Notes (Signed)
  Date: 10/13/2016  Patient name: Thomas [name]  Medical record number: [id number]  Date of birth: July 02, 1946   This patient's plan of care was discussed with the house staff. Please see their note for complete details. I concur with their findings.  Being transferred to [facility] today.  Appreciate palliative care team assistance.   Campbell Riches, MD 10/13/2016, 8:39 AM

## 2016-10-13 NOTE — Care Management Note (Signed)
Case Management Note  Patient Details  Name: Thomas [name] MRN: [id number] Date of Birth: Jul 07, 1946  Subjective/Objective:                 DC to [facility] today  Action/Plan:   Expected Discharge Date:                  Expected Discharge Plan:  [place]  In-House Referral:  Clinical Social Work  Discharge planning Services  CM Consult  Post Acute Care Choice:  NA Choice offered to:  NA  DME Arranged:    DME Agency:     HH Arranged:    [place] Agency:     Status of Service:  Completed, signed off  If discussed at [employer], dates discussed:    Additional Comments:  Carles Collet, RN 10/13/2016, 10:31 AM

## 2016-10-13 NOTE — Progress Notes (Signed)
   Subjective: Patient was little drowsy when seen this morning. Family was at bedside. He was going to [facility] care today. The patient and his family decided late yesterday with draw all the treatment, because of his disease progression.  Objective:  Vital signs in last 24 hours: Vitals:   10/11/16 1700 10/11/16 1730 10/11/16 2123 10/12/16 1034  BP: 124/75 135/74 140/69 138/66  Pulse: 96 95 95 98  Resp: 20 (!) '21 16 16  '$ Temp:  97.5 F (36.4 C) 97.6 F (36.4 C) 98 F (36.7 C)  TempSrc:  Oral Oral Axillary  SpO2: 100% 99% 98% 99%  Weight:  127 lb 3.3 oz (57.7 kg)    Height:       Gen.Lesle Chris, chronically ill-looking man, drowsy, in no distress. Lungs.Clear bilaterally CV. Tachycardia with regular rhythm Abdomen. Soft, nontender, bowel sounds positive. Extremities. No edema, no cyanosis, pulses 2+ bilaterally.  Assessment/Plan:  70 year old male with past medical history of multiple myeloma on steroids, thrombocytopenia, end-stage renal disease on hemodialysis, acalculous cholecystitis status post ERCP with biliary tube and anemia who presents with weakness and confusion.  Altered mental status/hypercalcemia .Most probably due to metabolic encephalopathy because of hypercalcemia secondary to multiple myeloma. The patient and family decided to withdraw all the treatments including dialysis. He is going to be discharged to [facility] hospice care. -Antibiotics and dialysis was stopped. Established comfort measures including pain medicine and Ativan for anxiety.   Dispo: Being discharged to hospice today.   Lorella Nimrod, MD 10/13/2016, 8:06 AM Pager: [phone]

## 2016-10-13 NOTE — Discharge Summary (Signed)
Name: Thomas [name] MRN: [id number] DOB: 05-Nov-1946 70 y.o. PCP: No Pcp Per Patient  Date of Admission: 10/09/2016  6:46 AM Date of Discharge: 10/13/2016 Attending Physician: Sid Falcon, MD  Discharge Diagnosis: 1. Hypercalcemia. 2. Progressive multiple myeloma 3. End-stage renal disease on dialysis 4. Anemia due to bone marrow failure 5. Thrombocytopenia    Discharge Medications:   Medication List    STOP taking these medications   acyclovir 200 MG capsule Commonly known as:  ZOVIRAX   ALPRAZolam 0.25 MG tablet Commonly known as:  XANAX   calcium acetate 667 MG capsule Commonly known as:  PHOSLO   cyanocobalamin 1000 MCG tablet   glucose blood test strip   insulin aspart 100 UNIT/ML injection Commonly known as:  NOVOLOG   Lancets Misc   loratadine 10 MG tablet Commonly known as:  CLARITIN   midodrine 10 MG tablet Commonly known as:  PROAMATINE   multivitamin Tabs tablet   oxyCODONE 5 MG immediate release tablet Commonly known as:  Oxy IR/ROXICODONE   pantoprazole 40 MG tablet Commonly known as:  PROTONIX   polyethylene glycol packet Commonly known as:  MIRALAX / GLYCOLAX   protein supplement Powd   saccharomyces boulardii 250 MG capsule Commonly known as:  FLORASTOR   triamcinolone 55 MCG/ACT Aero nasal inhaler Commonly known as:  NASACORT AQ   Vitamin D3 50000 units Caps     TAKE these medications   acetaminophen 325 MG tablet Commonly known as:  TYLENOL Take 2 tablets (650 mg total) by mouth every 6 (six) hours as needed for mild pain (or Fever >/= 101).   dexamethasone 2 MG tablet Commonly known as:  DECADRON Take 4 mg by mouth daily. On Sunday, before first chemo treatment.   dextromethorphan-guaiFENesin 30-600 MG 12hr tablet Commonly known as:  MUCINEX DM Take 1 tablet by mouth 2 (two) times daily as needed for cough.   diphenoxylate-atropine 2.5-0.025 MG tablet Commonly known as:  LOMOTIL Take 1 tablet by mouth 4 (four)  times daily as needed for diarrhea or loose stools.   LORazepam 1 MG tablet Commonly known as:  ATIVAN Take 1 tablet (1 mg total) by mouth every 4 (four) hours as needed for anxiety.   methocarbamol 500 MG tablet Commonly known as:  ROBAXIN Take 500 mg by mouth every 6 (six) hours as needed for muscle spasms. Takes a half tablet as needed   ondansetron 8 MG tablet Commonly known as:  ZOFRAN Take 1 tablet (8 mg total) by mouth every 8 (eight) hours as needed for nausea.   traMADol 50 MG tablet Commonly known as:  ULTRAM Take 1 tablet (50 mg total) by mouth every 6 (six) hours as needed.       Disposition and follow-up:   Thomas [name] was discharged from [facility] in [place] condition.  At the hospital follow up visit please address:  1.  None  2.  Labs / imaging needed at time of follow-up: None  3.  Pending labs/ test needing follow-up: None  Follow-up Appointments:   Hospital Course by problem list:  70 year old male with past medical history of multiple myeloma on steroids, thrombocytopenia, end-stage renal disease on hemodialysis, acalculous cholecystitis status post ERCP with biliary tube and anemia who presents with weakness and confusion.  Altered mental status/hypercalcemia. Most probably due to metabolic encephalopathy because of hypercalcemia secondary to multiple myeloma. He was getting hemodialysis with low calcium bath, to decrease his calcium levels. His orientation improved intermittently after dialysis,  his calcium level decreases, but stays above normal limits given with dialysis and they keep rising up.Patient was becoming very fatigued after dialysis and stating that diagnosis is getting rough for him .  Multiple myeloma. His disease progress in spite being on remission chemotherapy. Initially they thought about starting a palliative chemotherapy, which could not be started with his current comorbidities and recent infection. He was  developing new bone lesions. DR. Alvy Bimler had a long talk with patient and his family, they decided to withdraw all the treatment including dialysis, as it is becoming fruitless.  Anemia. On admission his hemoglobin was 6.6, which become 9 after 2 units of blood transfusion. It was stable around 8.7 on discharge. Plan was to keep his hemoglobin above 7.  Thrombocytopenia. Because of progressive multiple myeloma. Initial plan was to give him platelet transfusion if the platelet drops below 10,000 or if he starts actively bleeding. Further management is deferred to palliative care management.  Patient and his family decided to go with [facility] place hospice care.  Discharge Vitals:   BP 138/66 (BP Location: Left Arm)   Pulse 98   Temp 98 F (36.7 C) (Axillary)   Resp 16   Ht 6' 3"  (1.905 m)   Wt 127 lb 3.3 oz (57.7 kg)   SpO2 99%   BMI 15.90 kg/m   Thomas [name], chronically ill-looking man, drowsy, in no distress. Lungs.Clear bilaterally CV. Tachycardia with regular rhythm Abdomen.Soft, nontender, bowel sounds positive. Extremities. No edema, no cyanosis, pulses 2+ bilaterally.  Pertinent Labs, Studies, and Procedures:  CBC Latest Ref Rng & Units 10/12/2016 10/11/2016 10/10/2016  WBC 4.0 - 10.5 K/uL 8.8 9.2 9.5  Hemoglobin 13.0 - 17.0 g/dL 8.7(L) 8.6(L) 8.5(L)  Hematocrit 39.0 - 52.0 % 27.2(L) 26.4(L) 26.5(L)  Platelets 150 - 400 K/uL 68(L) 72(L) 73(L)   BMP Latest Ref Rng & Units 10/12/2016 10/11/2016 10/10/2016  Glucose 65 - 99 mg/dL 72 117(H) 89  BUN 6 - 20 mg/dL 16 27(H) 17  Creatinine 0.61 - 1.24 mg/dL 2.22(H) 3.62(H) 2.62(H)  Sodium 135 - 145 mmol/L 128(L) 129(L) 130(L)  Potassium 3.5 - 5.1 mmol/L 3.7 3.5 3.2(L)  Chloride 101 - 111 mmol/L 95(L) 96(L) 95(L)  CO2 22 - 32 mmol/L 26 23 26   Calcium 8.9 - 10.3 mg/dL 10.3 11.4(H) 10.1   Albumin. 1.6  CXR. FINDINGS: The lungs are less well inflated today. There is increased density in the left lower lobe more conspicuous than on  the previous study. The interstitial markings elsewhere in both lungs are mildly increased as well. The cardiac silhouette is enlarged. The pulmonary vascularity is prominent centrally but stable. The dual-lumen dialysis catheter tip projects over the distal third of the SVC. A drainage catheter projects in the right upper quadrant. There is calcification in the wall of the aortic arch. The bony thorax exhibits no acute abnormality.  IMPRESSION: Increasing density in the left lower lobe consistent with progressive atelectasis or pneumonia. Increased interstitial prominence bilaterally consistent with low-grade interstitial edema of cardiac or noncardiac cause.  Chest CT scanning is recommended to exclude occult malignancy with central obstructing lesion.  CT Chest. FINDINGS: Cardiovascular: Right IJ dialysis catheters extend to the SVC right atrial junction. There is mild atherosclerosis of the aorta, great vessels and coronary arteries. No acute vascular findings are seen on noncontrast imaging. The heart is mildly enlarged. There is no pericardial effusion.  Mediastinum/Nodes: There are no enlarged mediastinal, hilar or axillary lymph nodes.Hilar assessment is limited by the lack of intravenous contrast.  The thyroid gland, trachea and esophagus demonstrate no significant findings.  Lungs/Pleura: There are small left-greater-than-right pleural effusions. Both lungs demonstrate mild dependent atelectasis and central airway thickening. There is no consolidation or suspicious pulmonary nodule.  Upper abdomen:  The visualized upper abdomen appears unremarkable.  Musculoskeletal/Chest wall: Numerous lytic lesions are again noted consistent with multiple myeloma. There are progressive mild pathologic fractures at T2 and T6. There is a probable pathologic fracture of the upper sternal body, best seen on the sagittal images. There is a large, lytic expansile lesion within the left eighth rib  posteriorly.  IMPRESSION: 1. Small pleural effusions with mild bibasilar atelectasis. 2. No adenopathy. 3. Diffuse changes of multiple myeloma with new pathologic fractures at T2 and T6.  CT Head. FINDINGS: Brain: The brain shows generalized atrophy. No evidence of old or acute focal infarction, mass lesion, hemorrhage, hydrocephalus or extra-axial collection.  Vascular: There is atherosclerotic calcification of the major vessels at the base of the brain.  Skull: Multiple lytic foci within the calvarium consistent with the clinical history of multiple myeloma. No dominant lesion or lesion associated with extraosseous mass.  Sinuses/Orbits: Sinuses clear except for minimal mucosal thickening of the right maxillary sinus. Orbits appear unremarkable.  Other: None significant  IMPRESSION: Generalized atrophy.  No acute or focal intracranial finding.  Multiple calvarial lucencies consistent with the clinical diagnosis of multiple myeloma.    Signed: Lorella Nimrod, MD 10/13/2016, 10:02 AM   Pager: [phone]

## 2016-10-13 NOTE — Progress Notes (Signed)
Per bedside RN, patient does not wish to dialyze today.  Nephrology notified.

## 2016-10-14 LAB — CULTURE, BLOOD (ROUTINE X 2)
Culture: NO GROWTH
Culture: NO GROWTH

## 2016-10-16 ENCOUNTER — Other Ambulatory Visit: Payer: Self-pay | Admitting: *Deleted

## 2016-10-16 NOTE — Patient Outreach (Addendum)
[facility]) Care Management  10/16/2016  Josen Amenta [birth year] BN:[id number]   CSW was advised of patient's admit to [facility] for end of life care.  CSW will close case and advise [facility] team of plans.   Eduard Clos, MSW, [place] Worker  [place] [phone]

## 2016-10-17 ENCOUNTER — Other Ambulatory Visit: Payer: PPO

## 2016-10-17 ENCOUNTER — Ambulatory Visit: Payer: PPO | Admitting: Hematology and Oncology

## 2016-10-17 ENCOUNTER — Ambulatory Visit: Payer: PPO

## 2016-10-24 ENCOUNTER — Ambulatory Visit: Payer: PPO

## 2016-10-24 ENCOUNTER — Other Ambulatory Visit: Payer: PPO

## 2016-10-25 ENCOUNTER — Telehealth: Payer: Self-pay | Admitting: *Deleted

## 2016-10-25 NOTE — Telephone Encounter (Signed)
Wife left VM wants to make sure Dr. Alvy Bimler is aware pt passed away at [facility] on Friday.  She wanted to thank Dr. Alvy Bimler and [facility] Staff for all our help w/ pt. . She says she is very appreciative and knows pt was also grateful for our help.Marland Kitchen

## 2016-10-31 ENCOUNTER — Ambulatory Visit: Payer: PPO

## 2016-10-31 ENCOUNTER — Other Ambulatory Visit: Payer: PPO

## 2016-11-06 IMAGING — US US RENAL
1 series · 14 of 25 positions shown · non-contrast
Comparison: None.

CLINICAL DATA: Acute kidney injury.

EXAM:
RENAL / URINARY TRACT ULTRASOUND COMPLETE

[Series 1: us renal · 0.23mm/px · 14 of 30 slices shown]
[im 1/30]
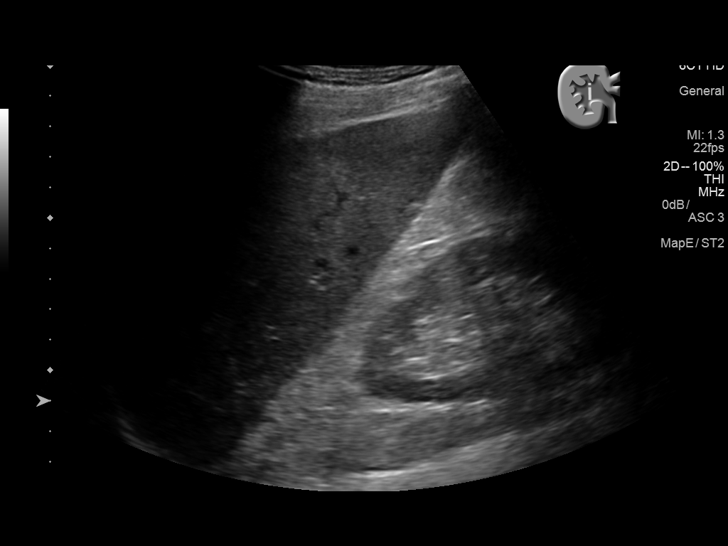
[im 3/30]
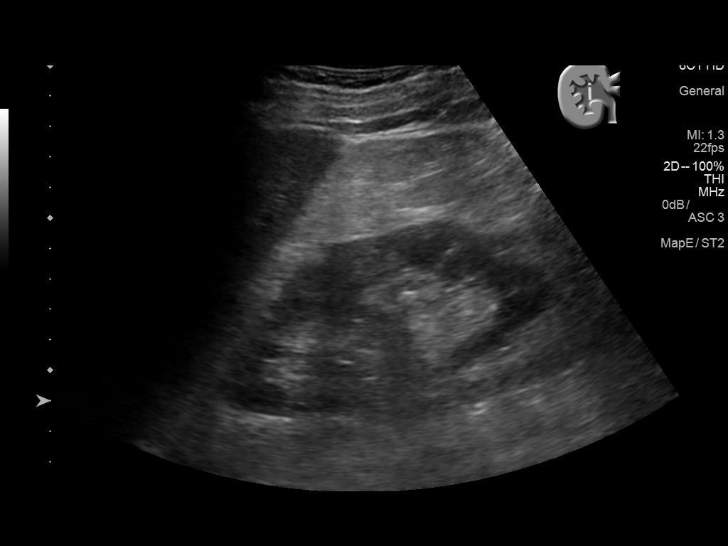
[im 5/30]
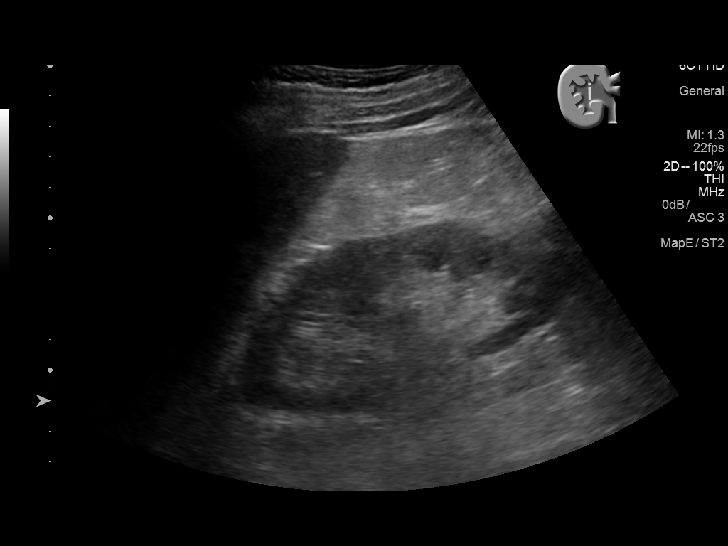
[im 8/30]
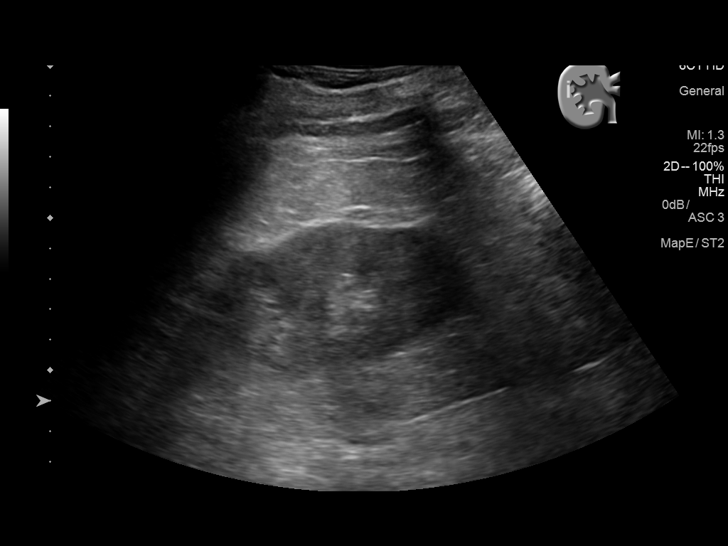
[im 10/30]
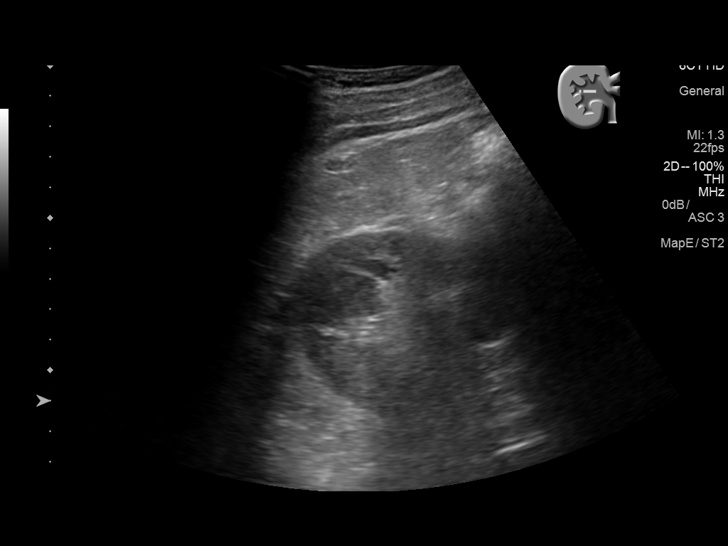
[im 11/30]
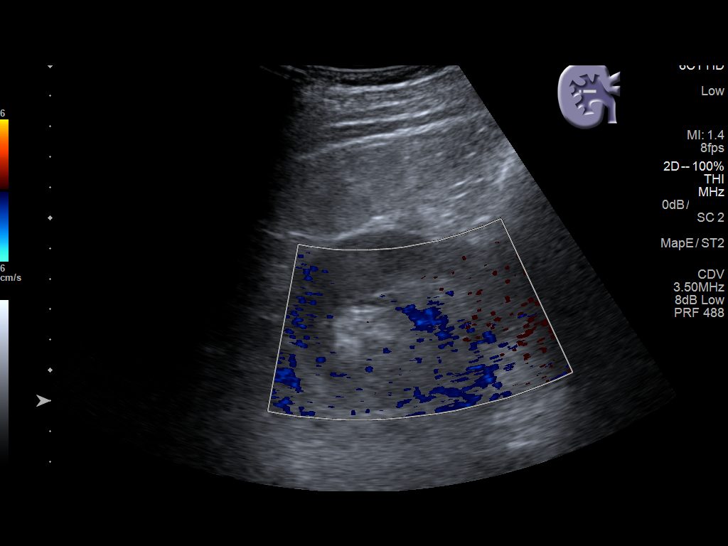
[im 14/30]
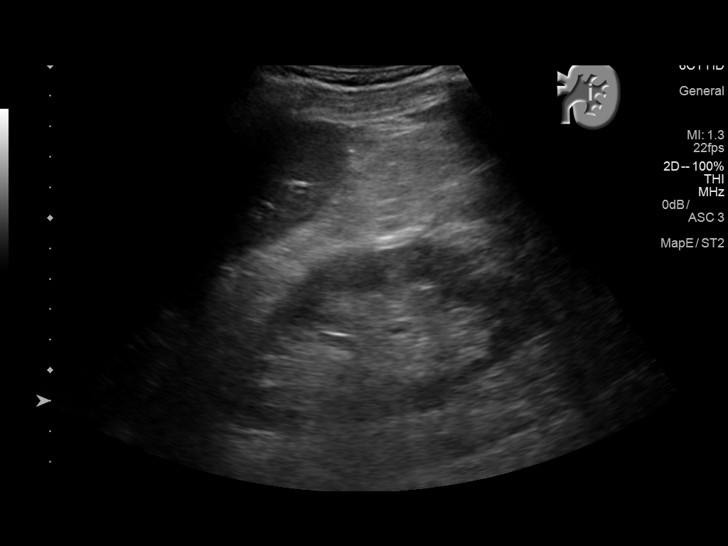
[im 16/30]
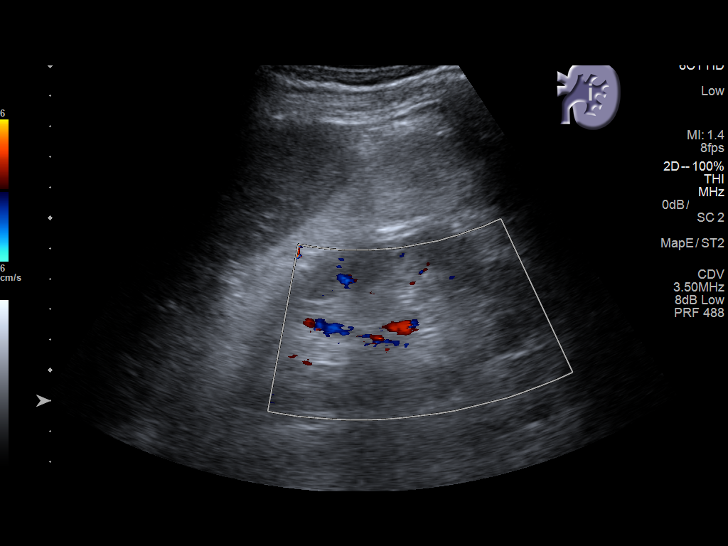
[im 19/30]
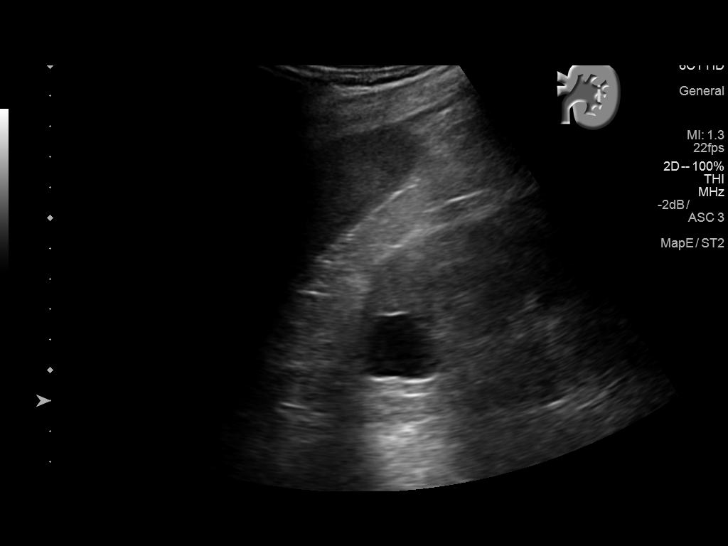
[im 20/30]
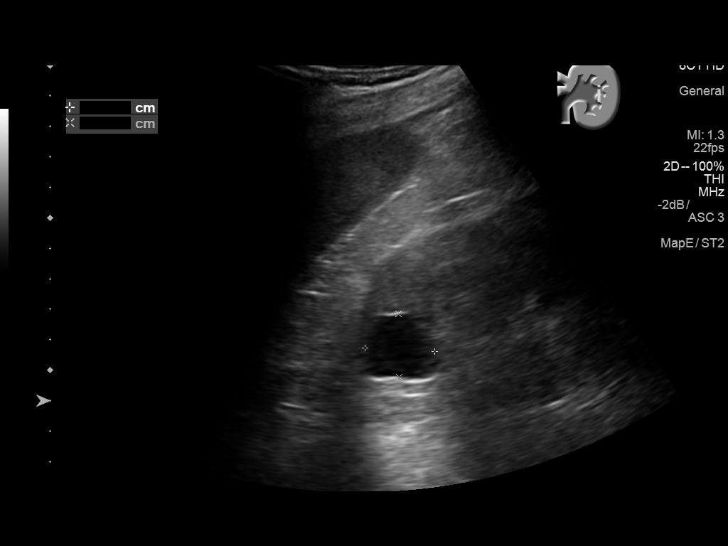
[im 22/30]
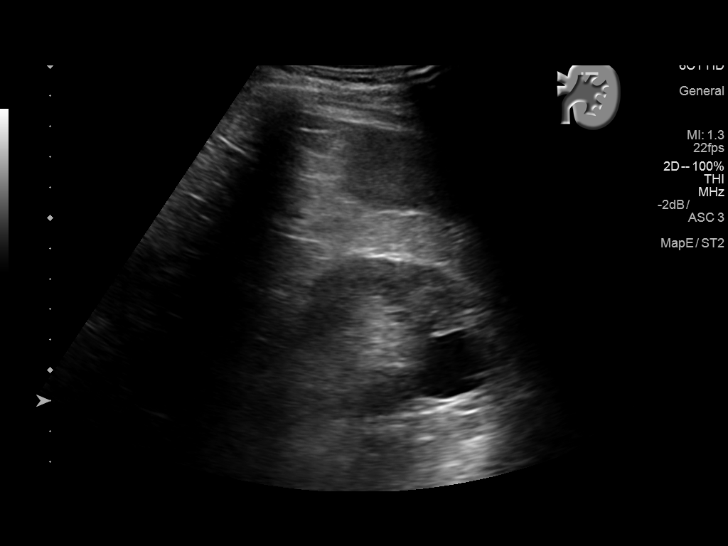
[im 25/30]
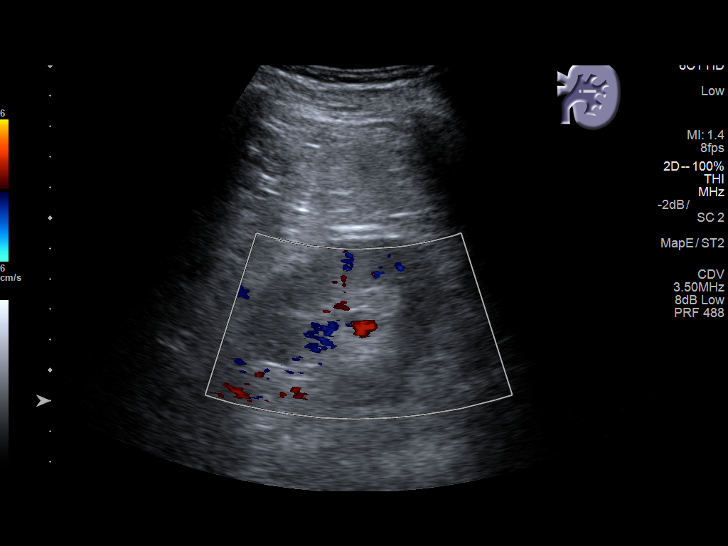
[im 27/30]
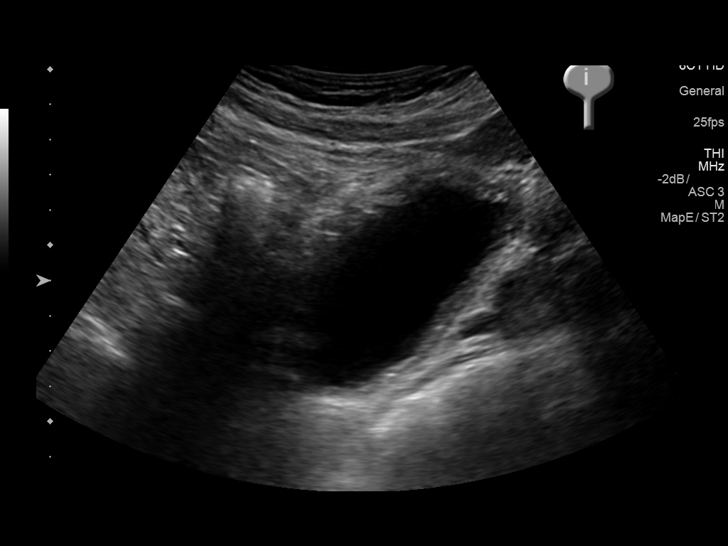
[im 30/30]
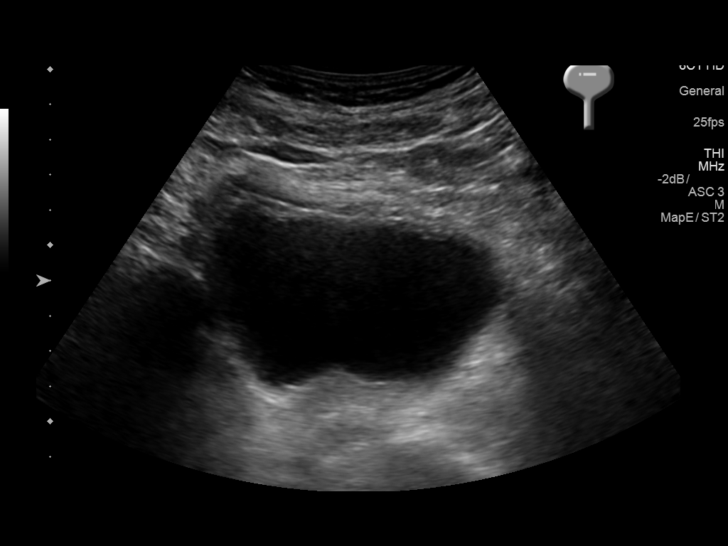

[14 of 25 positions shown; findings below may reference images not displayed]

FINDINGS: Right Kidney:

Length: 11.4 cm. Echogenicity within normal limits. No mass or
hydronephrosis visualized.

Left Kidney:

Length: 11.6 cm. Echogenicity within normal limits. There is a 2.3 x
2.1 x 2.1 cm cyst in the upper pole. No fall with mass or
hydronephrosis visualized.

Bladder:

Appears normal for degree of bladder distention.
IMPRESSION: 1. Left renal cyst.  Otherwise unremarkable renal ultrasound.
2. No obstructive uropathy.

## 2016-11-07 ENCOUNTER — Ambulatory Visit: Payer: PPO

## 2016-11-07 ENCOUNTER — Other Ambulatory Visit: Payer: PPO

## 2016-11-10 DEATH — deceased

## 2016-11-14 ENCOUNTER — Ambulatory Visit: Payer: PPO

## 2016-11-14 ENCOUNTER — Other Ambulatory Visit: Payer: PPO

## 2016-11-18 ENCOUNTER — Other Ambulatory Visit: Payer: Self-pay | Admitting: Nurse Practitioner

## 2016-11-21 ENCOUNTER — Other Ambulatory Visit: Payer: PPO

## 2016-11-21 ENCOUNTER — Ambulatory Visit: Payer: PPO

## 2016-11-28 ENCOUNTER — Ambulatory Visit: Payer: PPO

## 2016-11-28 ENCOUNTER — Other Ambulatory Visit: Payer: PPO

## 2016-12-05 ENCOUNTER — Other Ambulatory Visit: Payer: PPO

## 2016-12-12 ENCOUNTER — Other Ambulatory Visit: Payer: PPO

## 2016-12-19 ENCOUNTER — Other Ambulatory Visit: Payer: PPO

## 2017-09-03 IMAGING — DX DG CHEST 2V
2 series · 2 of 2 positions shown · non-contrast
Comparison: None.

CLINICAL DATA: Congestive heart failure.

EXAM:
CHEST  2 VIEW

[chest pa]
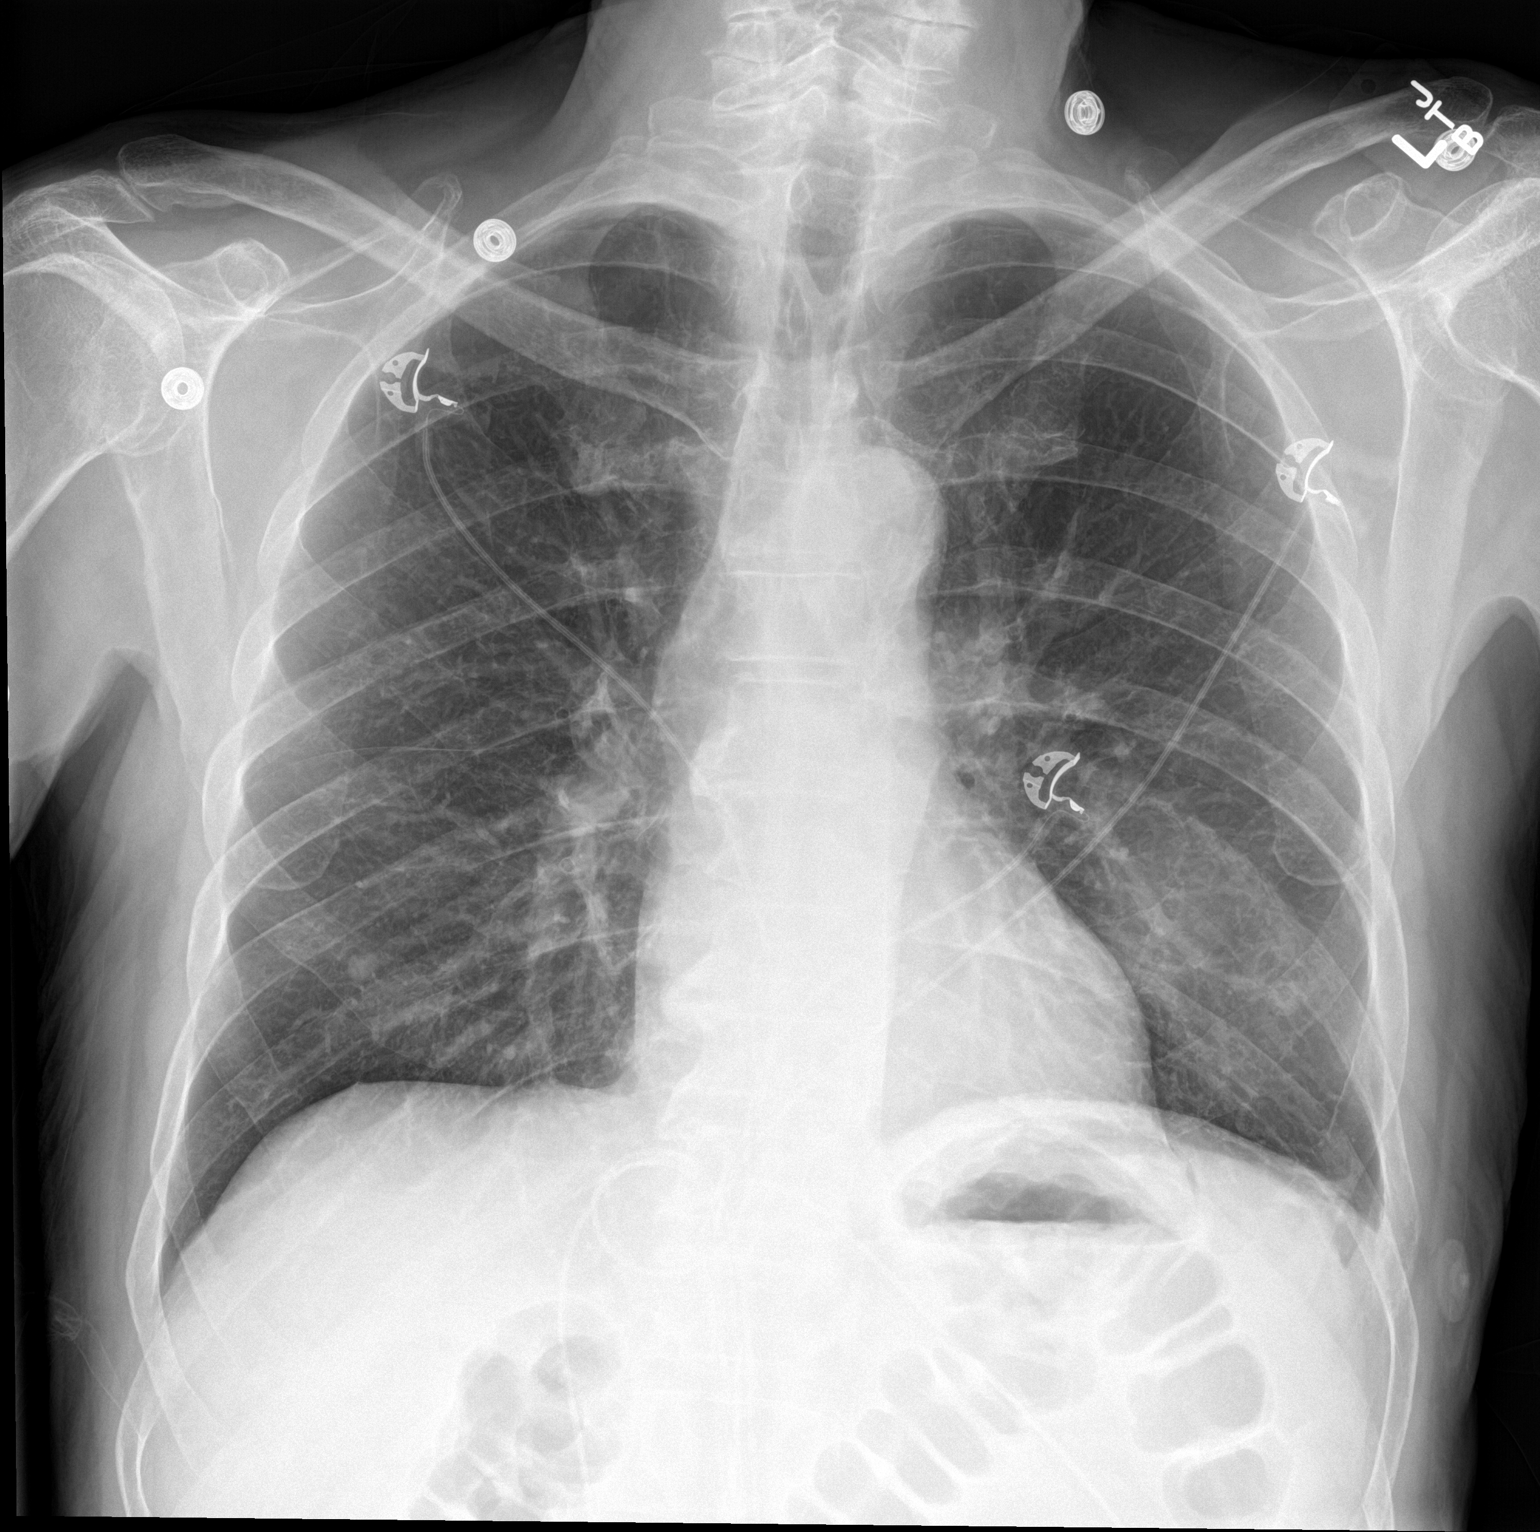

[chest lat]
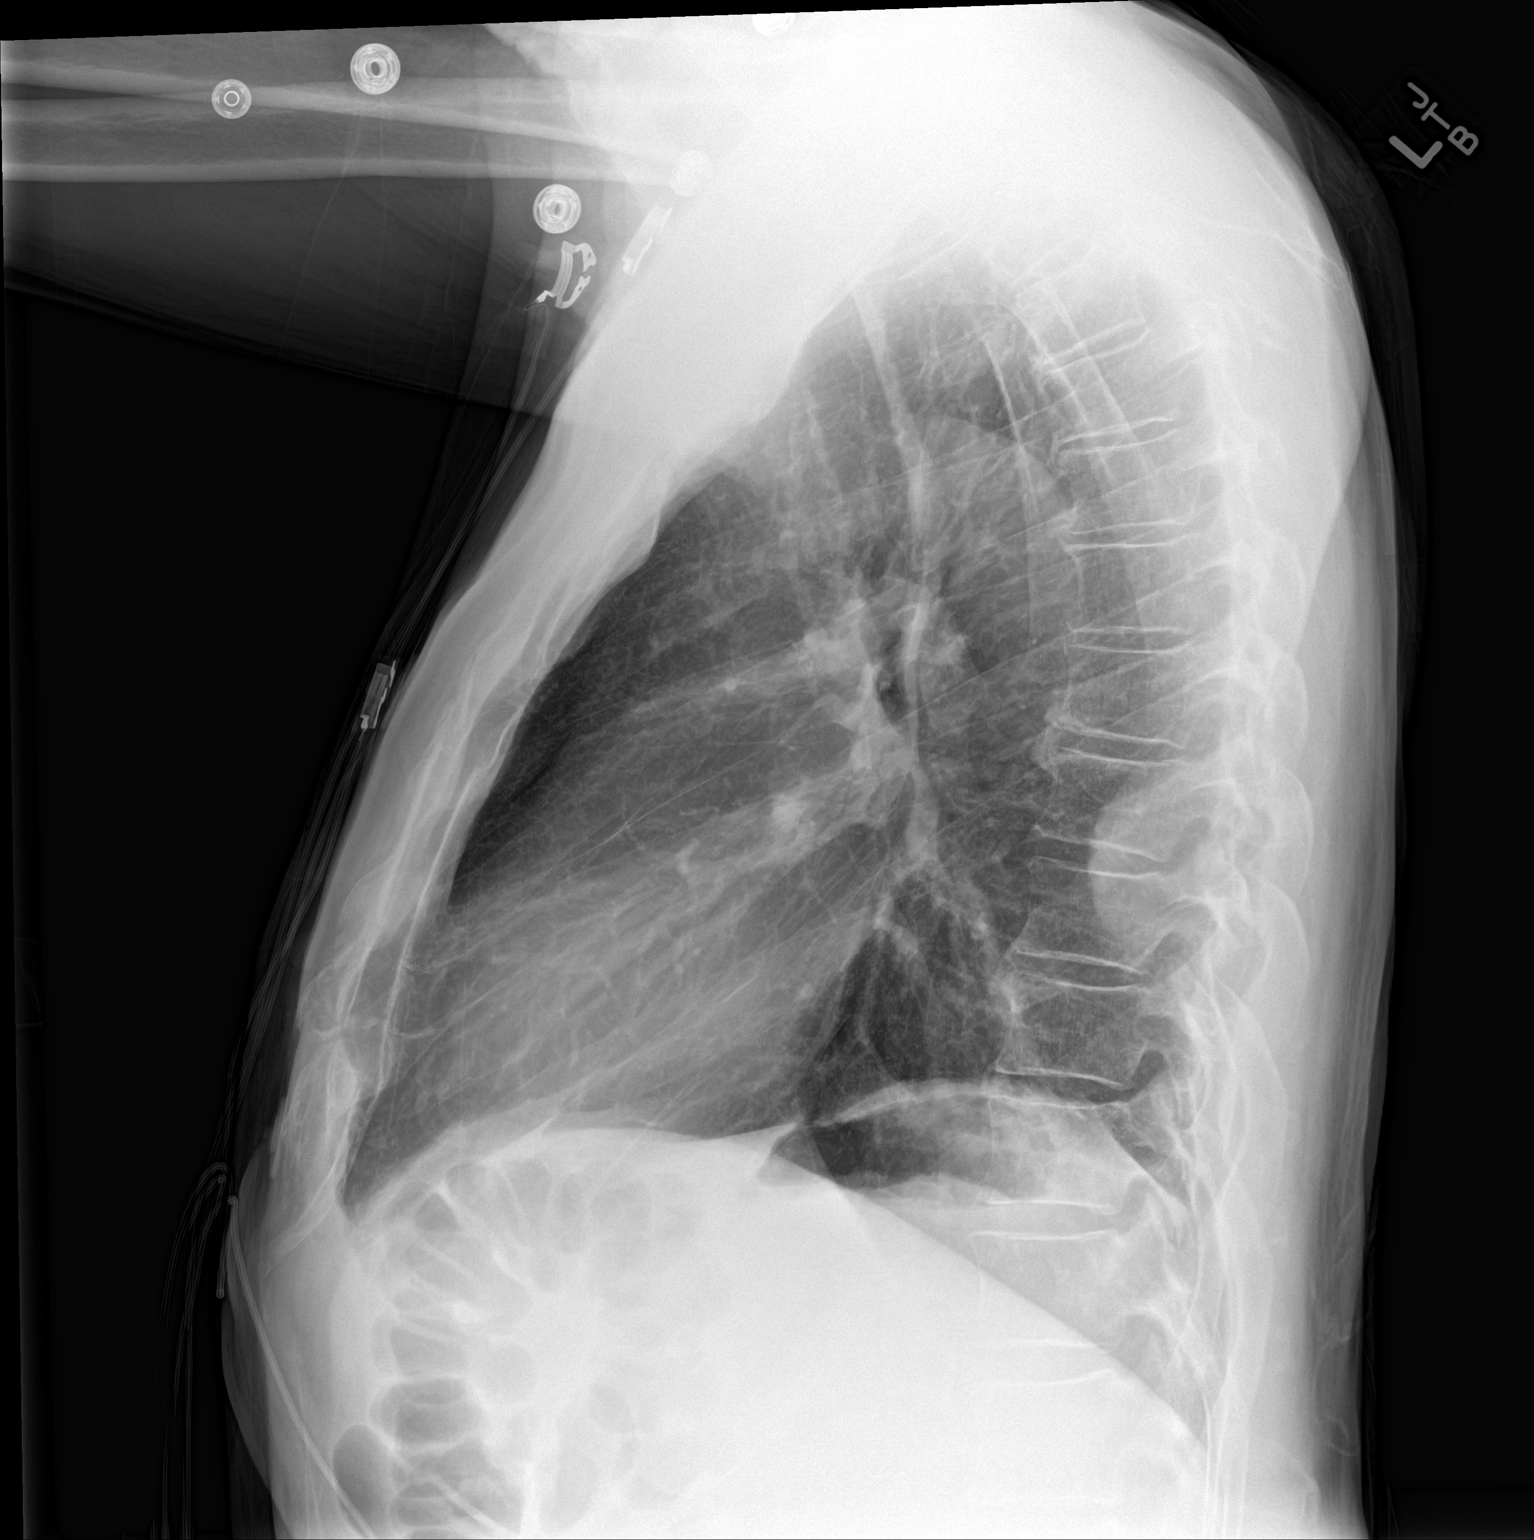

[2 of 2 positions shown; findings below may reference images not displayed]

FINDINGS: The heart size and mediastinal contours are within normal limits. No
pneumothorax or pleural effusion is noted. Right lung is clear.
Density is seen in left lower lobe which may represent either
pleural-based mass or possibly expansile lesion within left
posterior rib. The visualized skeletal structures are unremarkable.
IMPRESSION: Left lower lobe density is noted representing pleural-based mass or
expansile lesion within left posterior rib. CT scan of the chest is
recommended to evaluate for possible neoplasm or malignancy.

## 2017-09-03 IMAGING — CR DG LUMBAR SPINE 2-3V
3 series · 3 of 3 positions shown · non-contrast
Comparison: None.

CLINICAL DATA: Lower lumbar pain radiating down the left leg.
Injury in January 2016.

EXAM:
LUMBAR SPINE - 2-3 VIEW

[l-spine ap]
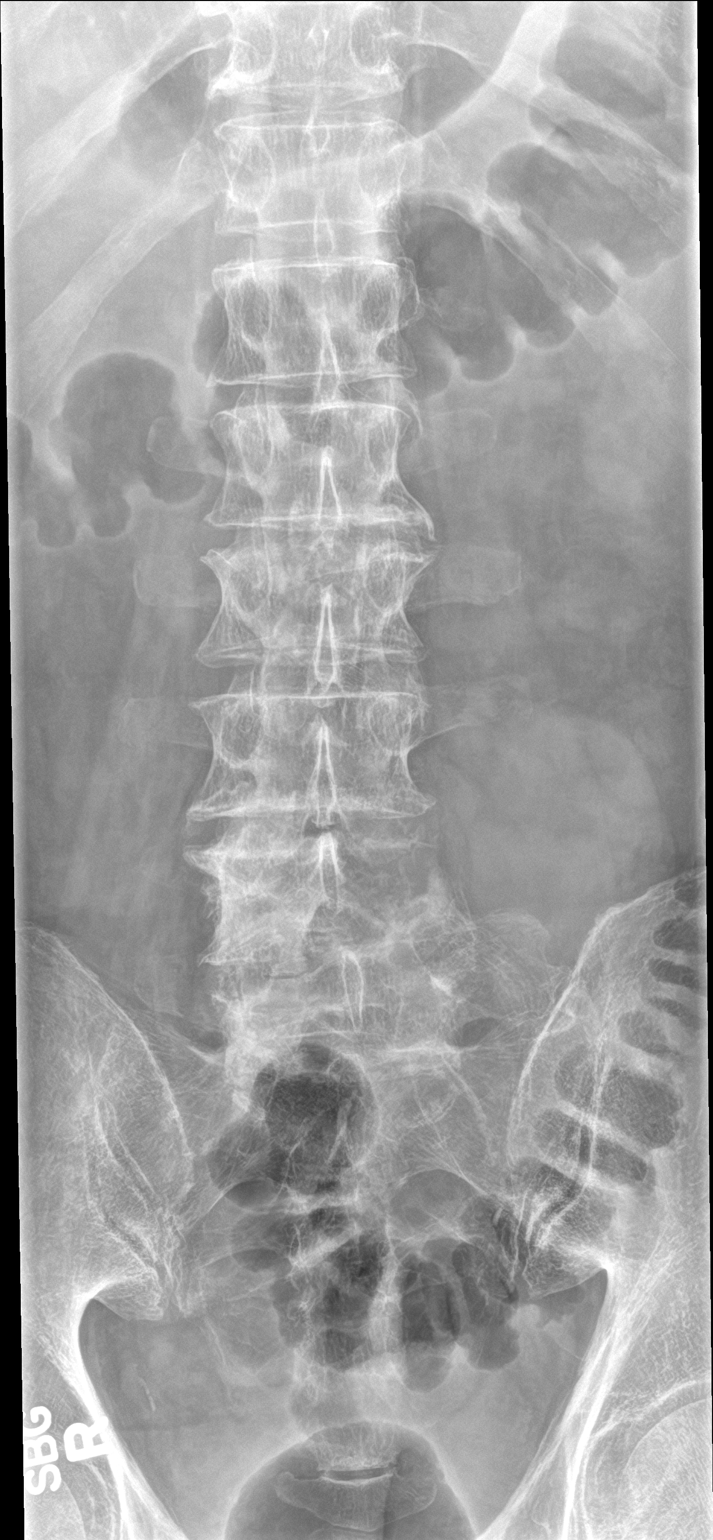

[l-spine spot]
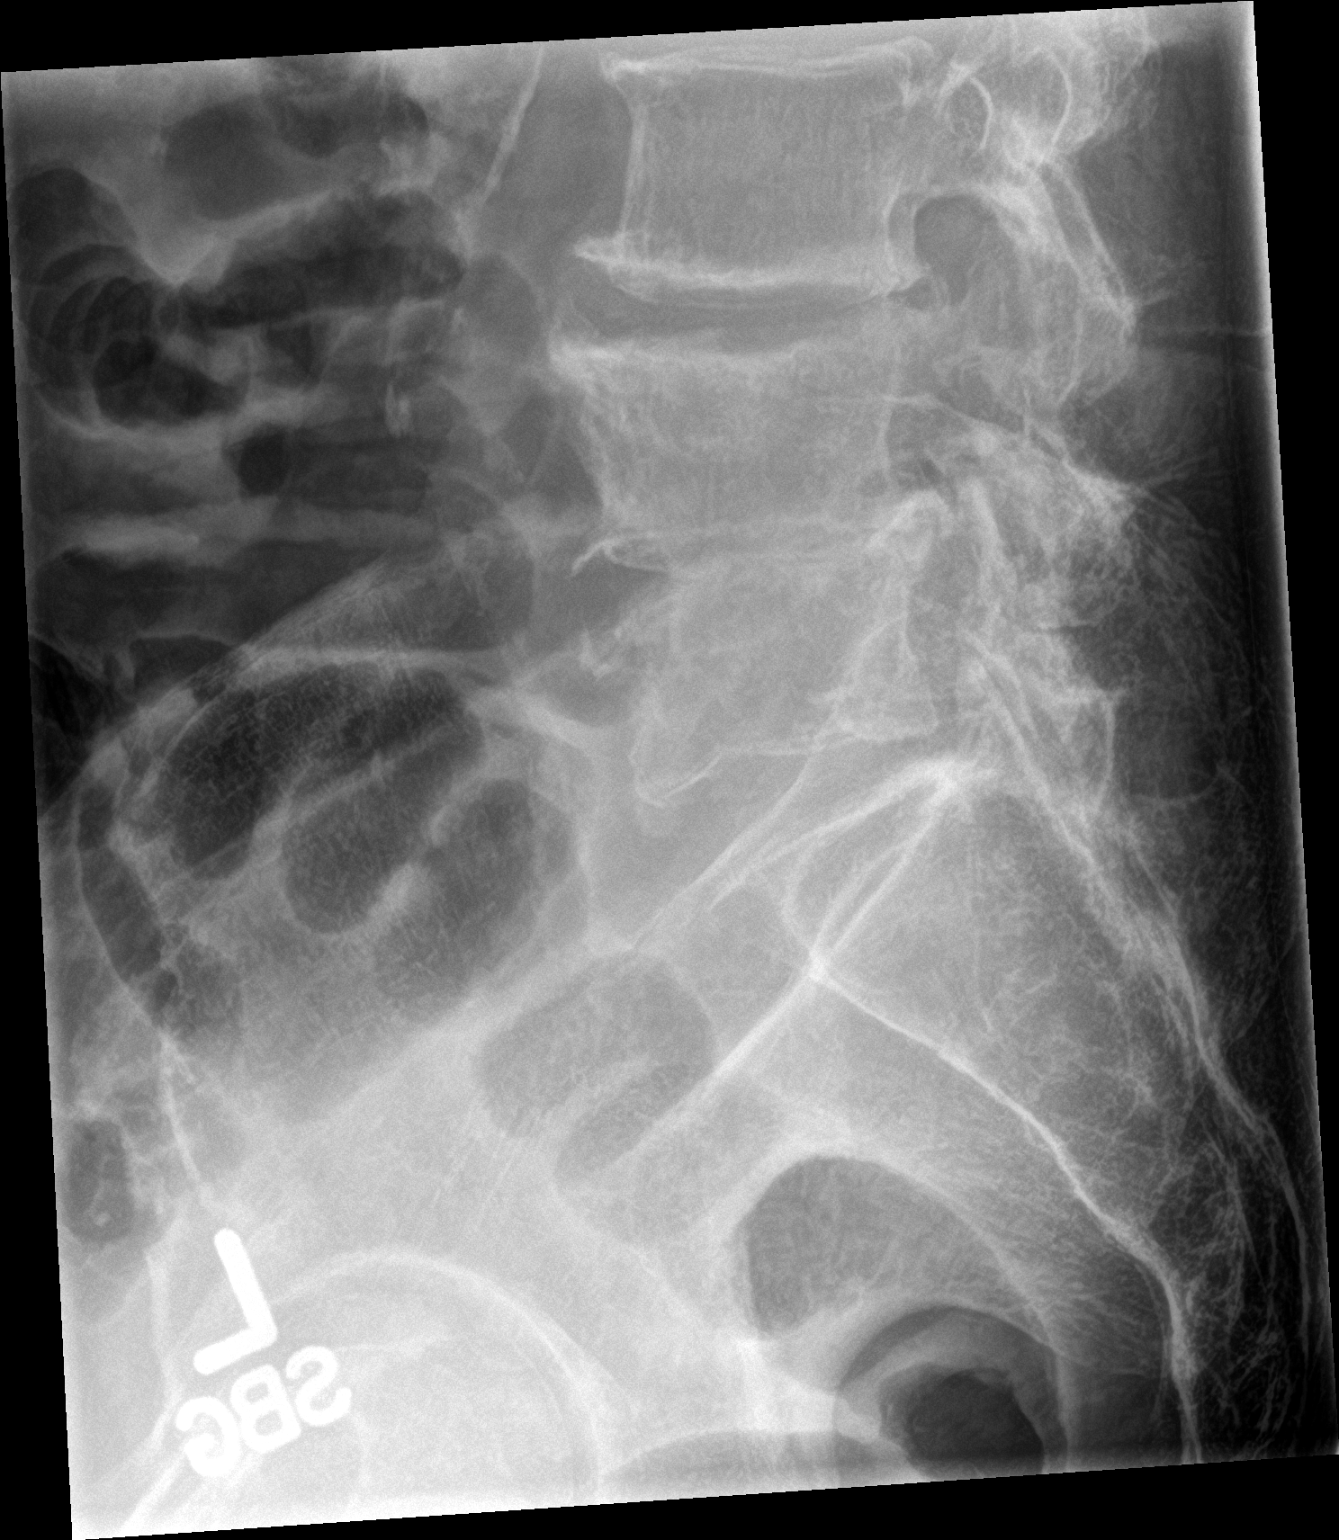

[l-spine lat]
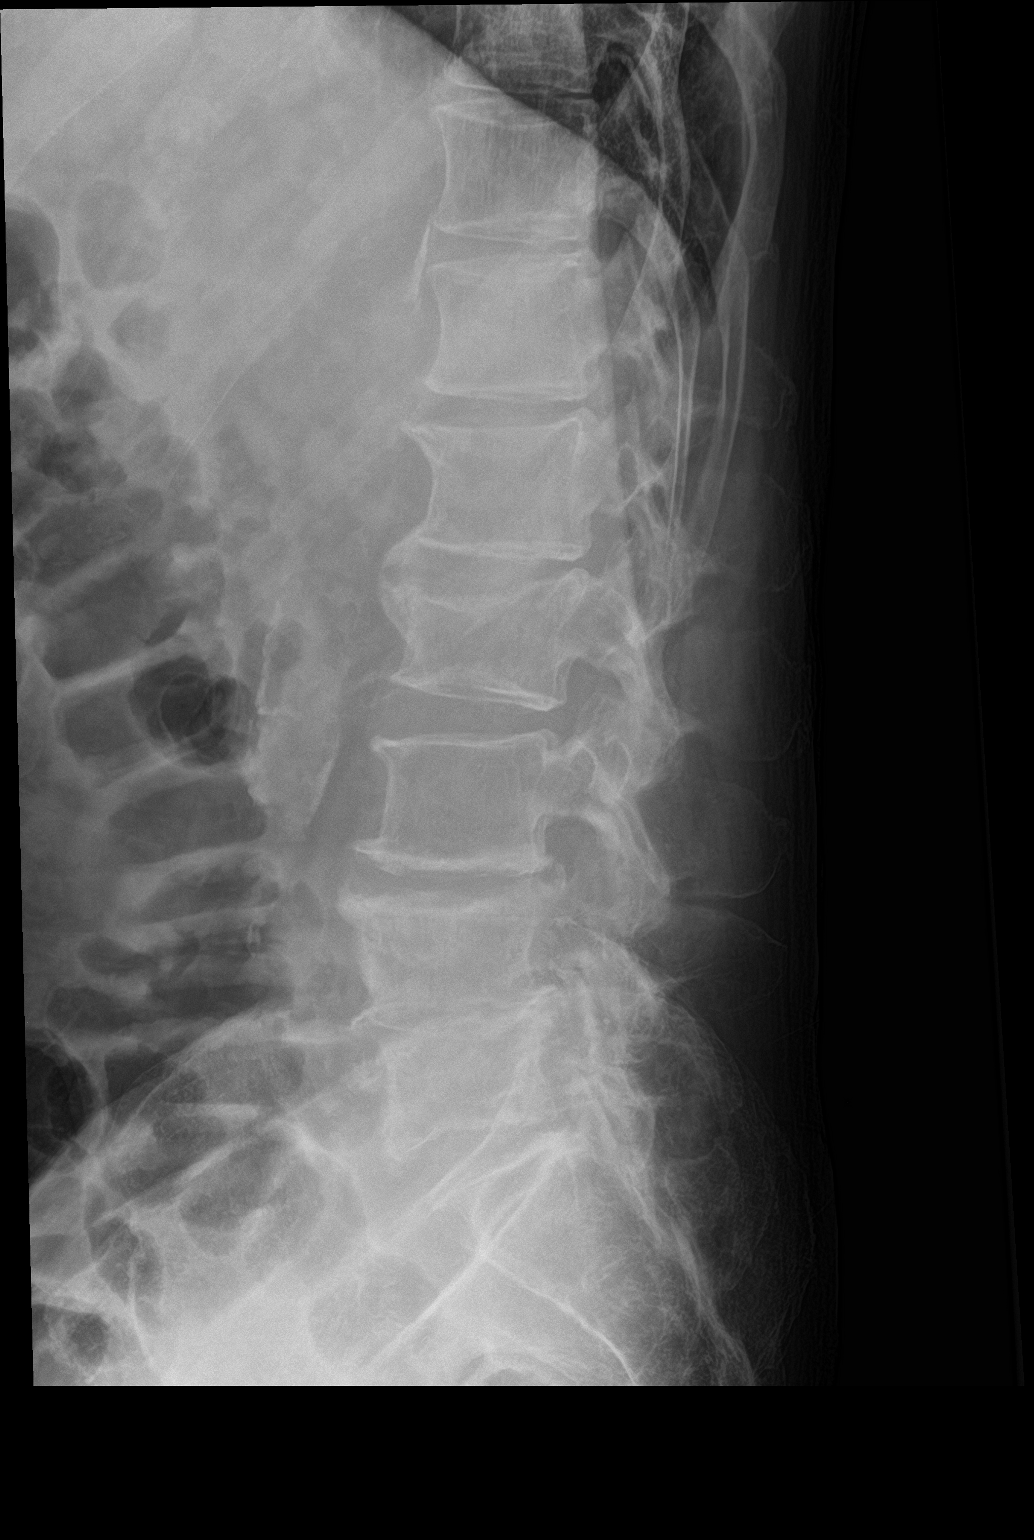

[3 of 3 positions shown; findings below may reference images not displayed]

FINDINGS: Transitional S1 vertebra.

Abnormal lucency in the left L5 vertebra appreciable on the frontal
projection, potentially with some slight compression in this
vicinity, but with lucency out of proportion to the compression.
Irregular margins of the cortex of the S1 vertebra on the lateral
projection.

Anterior wedge compression fracture at L3 with 30% loss of vertebral
body height.

Aortoiliac atherosclerotic vascular disease.
IMPRESSION: 1. Compression fractures at L3 and L5, with abnormal lucency in the
left side of the L5 vertebral body raising concern for multiple
myeloma or lytic metastatic disease from a radiographic standpoint.
MRI could be utilized for further characterization if clinically
warranted.
2. There is also cortical thickening and irregularity of the S1
vertebra, underlying malignancy not excluded at S 1.
3. 30% compression fracture at L 3, age indeterminate.
4. Lumbar spondylosis.
5.  Aortoiliac atherosclerotic vascular disease.
